# Patient Record
Sex: Female | Born: 1935
Health system: Southern US, Community
[De-identification: ages and names within clinical notes are randomized; demographics above are authoritative.]

## PROBLEM LIST (undated history)

## (undated) DIAGNOSIS — I480 Paroxysmal atrial fibrillation: Secondary | ICD-10-CM

## (undated) DIAGNOSIS — M810 Age-related osteoporosis without current pathological fracture: Secondary | ICD-10-CM

## (undated) DIAGNOSIS — I359 Nonrheumatic aortic valve disorder, unspecified: Secondary | ICD-10-CM

## (undated) DIAGNOSIS — I10 Essential (primary) hypertension: Secondary | ICD-10-CM

## (undated) DIAGNOSIS — E785 Hyperlipidemia, unspecified: Secondary | ICD-10-CM

## (undated) DIAGNOSIS — M1990 Unspecified osteoarthritis, unspecified site: Secondary | ICD-10-CM

## (undated) DIAGNOSIS — Z953 Presence of xenogenic heart valve: Secondary | ICD-10-CM

## (undated) DIAGNOSIS — R499 Unspecified voice and resonance disorder: Secondary | ICD-10-CM

## (undated) DIAGNOSIS — I639 Cerebral infarction, unspecified: Secondary | ICD-10-CM

## (undated) DIAGNOSIS — J449 Chronic obstructive pulmonary disease, unspecified: Secondary | ICD-10-CM

## (undated) DIAGNOSIS — R011 Cardiac murmur, unspecified: Secondary | ICD-10-CM

## (undated) DIAGNOSIS — H9190 Unspecified hearing loss, unspecified ear: Secondary | ICD-10-CM

## (undated) DIAGNOSIS — C801 Malignant (primary) neoplasm, unspecified: Secondary | ICD-10-CM

## (undated) DIAGNOSIS — R0989 Other specified symptoms and signs involving the circulatory and respiratory systems: Secondary | ICD-10-CM

## (undated) DIAGNOSIS — I350 Nonrheumatic aortic (valve) stenosis: Secondary | ICD-10-CM

## (undated) DIAGNOSIS — I739 Peripheral vascular disease, unspecified: Secondary | ICD-10-CM

## (undated) HISTORY — DX: Unspecified hearing loss, unspecified ear: H91.90

## (undated) HISTORY — DX: Cardiac murmur, unspecified: R01.1

## (undated) HISTORY — DX: Unspecified osteoarthritis, unspecified site: M19.90

## (undated) HISTORY — DX: Cerebral infarction, unspecified: I63.9

## (undated) HISTORY — DX: Essential (primary) hypertension: I10

## (undated) HISTORY — DX: Hyperlipidemia, unspecified: E78.5

## (undated) HISTORY — DX: Chronic obstructive pulmonary disease, unspecified: J44.9

## (undated) HISTORY — DX: Other specified symptoms and signs involving the circulatory and respiratory systems: R09.89

## (undated) HISTORY — DX: Age-related osteoporosis without current pathological fracture: M81.0

## (undated) HISTORY — PX: ABDOMINAL HYSTERECTOMY: SHX81

## (undated) HISTORY — DX: Malignant (primary) neoplasm, unspecified: C80.1

## (undated) HISTORY — DX: Unspecified voice and resonance disorder: R49.9

## (undated) HISTORY — DX: Peripheral vascular disease, unspecified: I73.9

## (undated) HISTORY — PX: PARTIAL HYSTERECTOMY: SHX80

## (undated) HISTORY — PX: CARDIAC CATHETERIZATION: SHX172

## (undated) HISTORY — DX: Nonrheumatic aortic valve disorder, unspecified: I35.9

---

## 1957-03-01 HISTORY — PX: COLON SURGERY: SHX602

## 1995-03-02 HISTORY — PX: EYE SURGERY: SHX253

## 1995-03-02 HISTORY — PX: COSMETIC SURGERY: SHX468

## 1995-09-29 DIAGNOSIS — Z853 Personal history of malignant neoplasm of breast: Secondary | ICD-10-CM | POA: Insufficient documentation

## 1995-09-29 HISTORY — PX: MASTECTOMY: SHX3

## 1997-02-25 HISTORY — PX: BREAST LUMPECTOMY: SHX2

## 1998-06-25 ENCOUNTER — Encounter: Payer: Self-pay | Admitting: Internal Medicine

## 1998-06-25 ENCOUNTER — Ambulatory Visit (HOSPITAL_COMMUNITY): Admission: RE | Admit: 1998-06-25 | Discharge: 1998-06-25 | Payer: Self-pay | Admitting: Internal Medicine

## 1998-08-11 ENCOUNTER — Ambulatory Visit (HOSPITAL_BASED_OUTPATIENT_CLINIC_OR_DEPARTMENT_OTHER): Admission: RE | Admit: 1998-08-11 | Discharge: 1998-08-11 | Payer: Self-pay

## 1999-06-26 ENCOUNTER — Ambulatory Visit (HOSPITAL_COMMUNITY): Admission: RE | Admit: 1999-06-26 | Discharge: 1999-06-26 | Payer: Self-pay

## 2000-05-02 ENCOUNTER — Other Ambulatory Visit: Admission: RE | Admit: 2000-05-02 | Discharge: 2000-05-02 | Payer: Self-pay | Admitting: Internal Medicine

## 2000-06-27 ENCOUNTER — Ambulatory Visit (HOSPITAL_COMMUNITY): Admission: RE | Admit: 2000-06-27 | Discharge: 2000-06-27 | Payer: Self-pay

## 2000-07-07 ENCOUNTER — Other Ambulatory Visit: Admission: RE | Admit: 2000-07-07 | Discharge: 2000-07-07 | Payer: Self-pay | Admitting: Internal Medicine

## 2000-07-07 ENCOUNTER — Encounter (INDEPENDENT_AMBULATORY_CARE_PROVIDER_SITE_OTHER): Payer: Self-pay | Admitting: Specialist

## 2001-06-28 ENCOUNTER — Ambulatory Visit (HOSPITAL_COMMUNITY): Admission: RE | Admit: 2001-06-28 | Discharge: 2001-06-28 | Payer: Self-pay | Admitting: Surgery

## 2001-06-28 ENCOUNTER — Encounter: Payer: Self-pay | Admitting: Surgery

## 2002-03-29 ENCOUNTER — Inpatient Hospital Stay (HOSPITAL_COMMUNITY): Admission: EM | Admit: 2002-03-29 | Discharge: 2002-03-30 | Payer: Self-pay | Admitting: Emergency Medicine

## 2002-03-29 ENCOUNTER — Encounter: Payer: Self-pay | Admitting: Family Medicine

## 2002-03-30 ENCOUNTER — Encounter: Payer: Self-pay | Admitting: Family Medicine

## 2002-04-12 ENCOUNTER — Encounter: Admission: RE | Admit: 2002-04-12 | Discharge: 2002-04-12 | Payer: Self-pay | Admitting: Family Medicine

## 2002-07-02 ENCOUNTER — Encounter: Payer: Self-pay | Admitting: Surgery

## 2002-07-02 ENCOUNTER — Ambulatory Visit (HOSPITAL_COMMUNITY): Admission: RE | Admit: 2002-07-02 | Discharge: 2002-07-02 | Payer: Self-pay | Admitting: Surgery

## 2003-07-04 ENCOUNTER — Ambulatory Visit (HOSPITAL_COMMUNITY): Admission: RE | Admit: 2003-07-04 | Discharge: 2003-07-04 | Payer: Self-pay | Admitting: Surgery

## 2004-01-19 ENCOUNTER — Emergency Department (HOSPITAL_COMMUNITY): Admission: EM | Admit: 2004-01-19 | Discharge: 2004-01-19 | Payer: Self-pay | Admitting: Emergency Medicine

## 2004-07-06 ENCOUNTER — Ambulatory Visit (HOSPITAL_COMMUNITY): Admission: RE | Admit: 2004-07-06 | Discharge: 2004-07-06 | Payer: Self-pay | Admitting: Surgery

## 2005-07-12 ENCOUNTER — Ambulatory Visit (HOSPITAL_COMMUNITY): Admission: RE | Admit: 2005-07-12 | Discharge: 2005-07-12 | Payer: Self-pay | Admitting: Surgery

## 2005-08-03 ENCOUNTER — Encounter: Admission: RE | Admit: 2005-08-03 | Discharge: 2005-08-03 | Payer: Self-pay | Admitting: Surgery

## 2005-10-08 HISTORY — PX: BIOPSY SHOULDER: PRO31

## 2006-08-23 ENCOUNTER — Ambulatory Visit (HOSPITAL_COMMUNITY): Admission: RE | Admit: 2006-08-23 | Discharge: 2006-08-23 | Payer: Self-pay | Admitting: Surgery

## 2007-08-24 ENCOUNTER — Ambulatory Visit (HOSPITAL_COMMUNITY): Admission: RE | Admit: 2007-08-24 | Discharge: 2007-08-24 | Payer: Self-pay | Admitting: *Deleted

## 2008-08-06 DIAGNOSIS — R0989 Other specified symptoms and signs involving the circulatory and respiratory systems: Secondary | ICD-10-CM

## 2008-08-06 HISTORY — DX: Other specified symptoms and signs involving the circulatory and respiratory systems: R09.89

## 2008-08-26 ENCOUNTER — Ambulatory Visit (HOSPITAL_COMMUNITY): Admission: RE | Admit: 2008-08-26 | Discharge: 2008-08-26 | Payer: Self-pay | Admitting: Surgery

## 2009-01-20 ENCOUNTER — Ambulatory Visit (HOSPITAL_COMMUNITY): Admission: RE | Admit: 2009-01-20 | Discharge: 2009-01-20 | Payer: Self-pay | Admitting: Internal Medicine

## 2009-04-17 ENCOUNTER — Encounter: Admission: RE | Admit: 2009-04-17 | Discharge: 2009-04-17 | Payer: Self-pay | Admitting: Internal Medicine

## 2009-06-02 ENCOUNTER — Ambulatory Visit (HOSPITAL_COMMUNITY): Admission: RE | Admit: 2009-06-02 | Discharge: 2009-06-02 | Payer: Self-pay | Admitting: Internal Medicine

## 2009-09-24 ENCOUNTER — Ambulatory Visit (HOSPITAL_COMMUNITY): Admission: RE | Admit: 2009-09-24 | Discharge: 2009-09-24 | Payer: Self-pay | Admitting: Surgery

## 2010-03-22 ENCOUNTER — Encounter: Payer: Self-pay | Admitting: Surgery

## 2010-06-19 ENCOUNTER — Ambulatory Visit (HOSPITAL_COMMUNITY): Payer: Medicare Other | Attending: Internal Medicine

## 2010-06-19 DIAGNOSIS — M810 Age-related osteoporosis without current pathological fracture: Secondary | ICD-10-CM | POA: Insufficient documentation

## 2010-07-17 NOTE — Discharge Summary (Signed)
[name], [name]                         ACCOUNT NO.:  [id number]   MEDICAL RECORD NO.:  [id number]                   PATIENT TYPE:  INP   LOCATION:  5731                                 FACILITY:  [facility]   PHYSICIAN:  Barney Drain, M.D.                 DATE OF BIRTH:  [birth year]   DATE OF ADMISSION:  03/29/2002  DATE OF DISCHARGE:  03/30/2002                                 DISCHARGE SUMMARY   SERVICE:  Family Practice Teaching Service.   ATTENDING PHYSICIAN:  Leighton Roach McDiarmid, M.D.   DISCHARGE DIAGNOSES:  1. Gastroesophageal reflux disease.  2. Hiatal hernia.  3. Osteoporosis.  4. Chronic constipation.  5. Colonic polyps.   DISCHARGE MEDICATIONS:  1. Protonix 40 mg one tablet p.o. b.i.d. for two weeks, then should be able     to decrease to one tablet p.o. daily depending on symptom control.  2. Patient is instructed to stop taking her Prilosec.  3. MiraLax, patient to increase her dosage to b.i.d. X3 days and then to     resume one packet p.o. daily.  4. Patient is instructed to stop taking Fosamax.  5. Tums, four tablets p.o. daily for calcium supplementation.  6. Continue Colace and Senokot as prior to hospitalization for bowel     regimen.  7. Continue vitamins E and C as prior to hospitalization for     supplementation.   DISCHARGE SPECIAL INSTRUCTIONS:  The patient is to avoid spicy and acidic  food.  She is to keep the head of her bed elevated and to avoid lying down  after eating.   DISPOSITION:  The patient was discharged home in stable condition with her  husband.   FOLLOW UP:  The patient is to see Saul Fordyce, Nurse Practitioner at [facility] on April 13, 2002 at 9:00 a.m.   PROCEDURES:  Upper gastrointestinal series performed by Dr. ______, [facility] on March 30, 2002.   CONSULTATIONS:   Gastroenterology, Dr. Tresa Endo.   HISTORY AND PHYSICAL:  The patient is a 77 year old female who was referred  to [facility] for evaluation by her primary care physician  at [facility] for pharyngitis, cough X4 days, inability to  keep any food down with postprandial cough.  The patient uses Prilosec  which was not providing any relief.  Her cough was nonproductive. She denied  fever, chills, nausea or vomiting.  No sick contacts.  The patient does have  a known vocal cord dysfunction for which she is treated with Botox  injections by Dr. Mikey Bussing at [facility].   Pertinent findings on physical examination included a temperature of 97.4,  blood pressure 165/79, oxygen saturation 99% on room air.  The patient was  in no apparent distress.  Her nares were patent. Tympanic membranes were  clear. Oropharynx had slight erythema, moist mucosal membranes.  Neck showed  shotty anterior cervical chain lymphadenopathy.  Lungs were clear to  auscultation save a pseudowheeze.  Abdomen was soft and nontender,  nondistended with good bowel sounds.   LABORATORY DATA:  Pertinent laboratory findings include white blood cell  count 7.2, hemoglobin 12.9, platelet count 238,000, MCV 90.5.  Sodium 138,  potassium 3.6, chloride 104, bicarb 28, BUN 7, creatinine 0.9, glucose 110.  Total bilirubin 0.6, alkaline phosphatase 74, AST 20, ALT 14, total protein  6.6, albumin 4.2, calcium 9.3.   HOSPITAL COURSE:  #1 - DISCOMFORT WITH SWALLOWING:  Upon taking the  patient's history it seemed that the patient's current symptoms were not at  all consistent with her known vocal cord dysfunction.  She has never had  feelings of obstruction when swallowing. Previously she was  known to have a  significant gastroesophageal reflux disease.  She denied postprandial emesis  or any chest discomfort.  [facility] was consulted and  recommended a barium swallow and upper gastrointestinal series which was  performed by Dr. ______.  Upper gastrointestinal series showed a large  hiatal hernia  with prominent reflux.  There was esophageal spasm associated  with gastric content reflux.  After discussing the case with  gastroenterology, it was decided that the patient's symptoms are likely  secondary to her pronounced chronic gastroesophageal reflux disease with  associated esophageal spasm.  Protonix 40 mg p.o. b.i.d. X two weeks was  begun in order to take control of the patient's symptoms.  It is hoped after  two weeks her symptoms will be adequately controlled to decrease the  Protonix to 40 mg daily.  The patient was instructed to stop taking her  Fosamax.  Of importance, the patient had a normal swallow on upper  gastrointestinal series and swallow study today.   #2 - CHRONIC CONSTIPATION:  The patient is to continue on her home regimen  of Senokot and Colace.  As she had barium contrast medium today she is to  increase her MiraLax to b.i.d. dosing for three days and then resume her  once daily dosing.   #3 - OSTEOPOROSIS:  As mentioned above, Fosamax was discontinued secondary  to esophageal irritability.  The patient is be on calcium supplementation  with Tums.                                               Barney Drain, M.D.    SS/MEDQ  D:  03/30/2002  T:  03/30/2002  Job:  [id number]   cc:   Willette Brace [facility]

## 2010-07-31 ENCOUNTER — Encounter (INDEPENDENT_AMBULATORY_CARE_PROVIDER_SITE_OTHER): Payer: Self-pay | Admitting: Surgery

## 2010-08-13 DIAGNOSIS — I739 Peripheral vascular disease, unspecified: Secondary | ICD-10-CM

## 2010-08-13 HISTORY — DX: Peripheral vascular disease, unspecified: I73.9

## 2010-09-03 ENCOUNTER — Other Ambulatory Visit (INDEPENDENT_AMBULATORY_CARE_PROVIDER_SITE_OTHER): Payer: Self-pay | Admitting: Surgery

## 2010-09-03 DIAGNOSIS — Z1231 Encounter for screening mammogram for malignant neoplasm of breast: Secondary | ICD-10-CM

## 2010-09-07 ENCOUNTER — Other Ambulatory Visit: Payer: Self-pay | Admitting: Internal Medicine

## 2010-09-07 ENCOUNTER — Other Ambulatory Visit (HOSPITAL_BASED_OUTPATIENT_CLINIC_OR_DEPARTMENT_OTHER): Payer: Self-pay | Admitting: Internal Medicine

## 2010-09-07 ENCOUNTER — Ambulatory Visit (HOSPITAL_COMMUNITY)
Admission: RE | Admit: 2010-09-07 | Discharge: 2010-09-07 | Disposition: A | Discharge status: Home / Self Care | Payer: Medicare Other | Source: Ambulatory Visit | Attending: Internal Medicine | Admitting: Internal Medicine

## 2010-09-07 DIAGNOSIS — R059 Cough, unspecified: Secondary | ICD-10-CM

## 2010-09-07 DIAGNOSIS — J40 Bronchitis, not specified as acute or chronic: Secondary | ICD-10-CM | POA: Insufficient documentation

## 2010-09-07 DIAGNOSIS — R05 Cough: Secondary | ICD-10-CM | POA: Insufficient documentation

## 2010-09-28 ENCOUNTER — Ambulatory Visit (HOSPITAL_COMMUNITY)
Admission: RE | Admit: 2010-09-28 | Discharge: 2010-09-28 | Disposition: A | Discharge status: Home / Self Care | Payer: Medicare Other | Source: Ambulatory Visit | Attending: Surgery | Admitting: Surgery

## 2010-09-28 DIAGNOSIS — Z1231 Encounter for screening mammogram for malignant neoplasm of breast: Secondary | ICD-10-CM

## 2010-09-30 HISTORY — PX: TOTAL HIP ARTHROPLASTY: SHX124

## 2010-10-09 ENCOUNTER — Other Ambulatory Visit: Payer: Self-pay | Admitting: Orthopedic Surgery

## 2010-10-09 ENCOUNTER — Encounter (HOSPITAL_COMMUNITY): Payer: Medicare Other

## 2010-10-09 LAB — SURGICAL PCR SCREEN
MRSA, PCR: NEGATIVE
Staphylococcus aureus: NEGATIVE

## 2010-10-09 LAB — COMPREHENSIVE METABOLIC PANEL
ALT: 18 U/L (ref 0–35)
AST: 23 U/L (ref 0–37)
Albumin: 4.3 g/dL (ref 3.5–5.2)
Alkaline Phosphatase: 59 U/L (ref 39–117)
BUN: 9 mg/dL (ref 6–23)
CO2: 30 mEq/L (ref 19–32)
Calcium: 10.4 mg/dL (ref 8.4–10.5)
Chloride: 99 mEq/L (ref 96–112)
Creatinine, Ser: 0.84 mg/dL (ref 0.50–1.10)
GFR calc Af Amer: >60 mL/min (ref >60)
GFR calc non Af Amer: >60 mL/min (ref >60)
Glucose, Bld: 98 mg/dL (ref 70–99)
Potassium: 4.4 mEq/L (ref 3.5–5.1)
Sodium: 136 mEq/L (ref 135–145)
Total Bilirubin: 0.4 mg/dL (ref 0.3–1.2)
Total Protein: 7.3 g/dL (ref 6.0–8.3)

## 2010-10-09 LAB — DIFFERENTIAL
Basophils Absolute: 0.1 10*3/uL (ref 0.0–0.1)
Basophils Relative: 1 % (ref 0–1)
Eosinophils Absolute: 0.2 10*3/uL (ref 0.0–0.7)
Eosinophils Relative: 3 % (ref 0–5)
Lymphocytes Relative: 24 % (ref 12–46)
Lymphs Abs: 1.8 10*3/uL (ref 0.7–4.0)
Monocytes Absolute: 0.6 10*3/uL (ref 0.1–1.0)
Monocytes Relative: 8 % (ref 3–12)
Neutro Abs: 4.6 10*3/uL (ref 1.7–7.7)
Neutrophils Relative %: 64 % (ref 43–77)

## 2010-10-09 LAB — PROTIME-INR
INR: 0.98 (ref 0.00–1.49)
Prothrombin Time: 13.2 seconds (ref 11.6–15.2)

## 2010-10-09 LAB — URINALYSIS, ROUTINE W REFLEX MICROSCOPIC
Bilirubin Urine: NEGATIVE
Glucose, UA: NEGATIVE mg/dL
Hgb urine dipstick: NEGATIVE
Ketones, ur: NEGATIVE mg/dL
Leukocytes, UA: NEGATIVE
Nitrite: NEGATIVE
Protein, ur: NEGATIVE mg/dL
Specific Gravity, Urine: 1.01 (ref 1.005–1.030)
Urobilinogen, UA: 0.2 mg/dL (ref 0.0–1.0)
pH: 7 (ref 5.0–8.0)

## 2010-10-09 LAB — APTT: aPTT: 37 seconds (ref 24–37)

## 2010-10-09 LAB — CBC
HCT: 36.2 % (ref 36.0–46.0)
Hemoglobin: 12.3 g/dL (ref 12.0–15.0)
MCH: 31.1 pg (ref 26.0–34.0)
MCHC: 34 g/dL (ref 30.0–36.0)
MCV: 91.6 fL (ref 78.0–100.0)
Platelets: 234 10*3/uL (ref 150–400)
RBC: 3.95 MIL/uL (ref 3.87–5.11)
RDW: 12.8 % (ref 11.5–15.5)
WBC: 7.2 10*3/uL (ref 4.0–10.5)

## 2010-10-15 ENCOUNTER — Inpatient Hospital Stay (HOSPITAL_COMMUNITY)
Admission: RE | Admit: 2010-10-15 | Discharge: 2010-10-19 | DRG: 467 | Disposition: A | Discharge status: Home Health | Payer: Medicare Other | Source: Ambulatory Visit | Attending: Orthopedic Surgery | Admitting: Orthopedic Surgery

## 2010-10-15 ENCOUNTER — Inpatient Hospital Stay (HOSPITAL_COMMUNITY): Payer: Medicare Other

## 2010-10-15 DIAGNOSIS — K219 Gastro-esophageal reflux disease without esophagitis: Secondary | ICD-10-CM | POA: Diagnosis present

## 2010-10-15 DIAGNOSIS — L508 Other urticaria: Secondary | ICD-10-CM | POA: Diagnosis not present

## 2010-10-15 DIAGNOSIS — Z9049 Acquired absence of other specified parts of digestive tract: Secondary | ICD-10-CM

## 2010-10-15 DIAGNOSIS — I359 Nonrheumatic aortic valve disorder, unspecified: Secondary | ICD-10-CM | POA: Diagnosis present

## 2010-10-15 DIAGNOSIS — D62 Acute posthemorrhagic anemia: Secondary | ICD-10-CM | POA: Diagnosis not present

## 2010-10-15 DIAGNOSIS — I10 Essential (primary) hypertension: Secondary | ICD-10-CM | POA: Diagnosis present

## 2010-10-15 DIAGNOSIS — I959 Hypotension, unspecified: Secondary | ICD-10-CM | POA: Diagnosis not present

## 2010-10-15 DIAGNOSIS — E871 Hypo-osmolality and hyponatremia: Secondary | ICD-10-CM | POA: Diagnosis not present

## 2010-10-15 DIAGNOSIS — Z01812 Encounter for preprocedural laboratory examination: Secondary | ICD-10-CM

## 2010-10-15 DIAGNOSIS — E876 Hypokalemia: Secondary | ICD-10-CM | POA: Diagnosis not present

## 2010-10-15 DIAGNOSIS — Z96649 Presence of unspecified artificial hip joint: Secondary | ICD-10-CM

## 2010-10-15 DIAGNOSIS — R0989 Other specified symptoms and signs involving the circulatory and respiratory systems: Secondary | ICD-10-CM | POA: Diagnosis present

## 2010-10-15 DIAGNOSIS — T84099A Other mechanical complication of unspecified internal joint prosthesis, initial encounter: Principal | ICD-10-CM | POA: Diagnosis present

## 2010-10-15 DIAGNOSIS — T84069A Wear of articular bearing surface of unspecified internal prosthetic joint, initial encounter: Secondary | ICD-10-CM | POA: Diagnosis present

## 2010-10-15 DIAGNOSIS — Y831 Surgical operation with implant of artificial internal device as the cause of abnormal reaction of the patient, or of later complication, without mention of misadventure at the time of the procedure: Secondary | ICD-10-CM | POA: Diagnosis present

## 2010-10-15 DIAGNOSIS — F411 Generalized anxiety disorder: Secondary | ICD-10-CM | POA: Diagnosis present

## 2010-10-15 DIAGNOSIS — Z853 Personal history of malignant neoplasm of breast: Secondary | ICD-10-CM

## 2010-10-15 DIAGNOSIS — H269 Unspecified cataract: Secondary | ICD-10-CM | POA: Diagnosis present

## 2010-10-15 DIAGNOSIS — K5900 Constipation, unspecified: Secondary | ICD-10-CM | POA: Diagnosis present

## 2010-10-15 LAB — ABO/RH: ABO/RH(D): O NEG

## 2010-10-16 LAB — CBC
HCT: 27.8 % — ABNORMAL LOW (ref 36.0–46.0)
Hemoglobin: 9.5 g/dL — ABNORMAL LOW (ref 12.0–15.0)
MCH: 31.6 pg (ref 26.0–34.0)
MCHC: 34.2 g/dL (ref 30.0–36.0)
MCV: 92.4 fL (ref 78.0–100.0)
Platelets: 178 10*3/uL (ref 150–400)
RBC: 3.01 MIL/uL — ABNORMAL LOW (ref 3.87–5.11)
RDW: 13 % (ref 11.5–15.5)
WBC: 5 10*3/uL (ref 4.0–10.5)

## 2010-10-16 LAB — BASIC METABOLIC PANEL
BUN: 5 mg/dL — ABNORMAL LOW (ref 6–23)
CO2: 29 mEq/L (ref 19–32)
Calcium: 8.1 mg/dL — ABNORMAL LOW (ref 8.4–10.5)
Chloride: 100 mEq/L (ref 96–112)
Creatinine, Ser: 0.72 mg/dL (ref 0.50–1.10)
GFR calc Af Amer: >60 mL/min (ref >60)
GFR calc non Af Amer: >60 mL/min (ref >60)
Glucose, Bld: 107 mg/dL — ABNORMAL HIGH (ref 70–99)
Potassium: 3.3 mEq/L — ABNORMAL LOW (ref 3.5–5.1)
Sodium: 135 mEq/L (ref 135–145)

## 2010-10-17 LAB — CBC
HCT: 29.4 % — ABNORMAL LOW (ref 36.0–46.0)
Hemoglobin: 10.2 g/dL — ABNORMAL LOW (ref 12.0–15.0)
MCH: 31.8 pg (ref 26.0–34.0)
MCHC: 34.7 g/dL (ref 30.0–36.0)
MCV: 91.6 fL (ref 78.0–100.0)
Platelets: 189 10*3/uL (ref 150–400)
RBC: 3.21 MIL/uL — ABNORMAL LOW (ref 3.87–5.11)
RDW: 13.2 % (ref 11.5–15.5)
WBC: 7.3 10*3/uL (ref 4.0–10.5)

## 2010-10-17 LAB — BASIC METABOLIC PANEL
BUN: 11 mg/dL (ref 6–23)
CO2: 28 mEq/L (ref 19–32)
Calcium: 8.9 mg/dL (ref 8.4–10.5)
Chloride: 105 mEq/L (ref 96–112)
Creatinine, Ser: 0.87 mg/dL (ref 0.50–1.10)
GFR calc Af Amer: >60 mL/min (ref >60)
GFR calc non Af Amer: >60 mL/min (ref >60)
Glucose, Bld: 142 mg/dL — ABNORMAL HIGH (ref 70–99)
Potassium: 4.1 mEq/L (ref 3.5–5.1)
Sodium: 138 mEq/L (ref 135–145)

## 2010-10-18 LAB — WOUND CULTURE
Culture: NO GROWTH
Gram Stain: NONE SEEN

## 2010-10-18 LAB — CBC
HCT: 25.5 % — ABNORMAL LOW (ref 36.0–46.0)
Hemoglobin: 8.7 g/dL — ABNORMAL LOW (ref 12.0–15.0)
MCH: 31.9 pg (ref 26.0–34.0)
MCHC: 34.1 g/dL (ref 30.0–36.0)
MCV: 93.4 fL (ref 78.0–100.0)
Platelets: 182 10*3/uL (ref 150–400)
RBC: 2.73 MIL/uL — ABNORMAL LOW (ref 3.87–5.11)
RDW: 13.3 % (ref 11.5–15.5)
WBC: 6.9 10*3/uL (ref 4.0–10.5)

## 2010-10-18 LAB — PREPARE RBC (CROSSMATCH)

## 2010-10-19 LAB — CROSSMATCH
ABO/RH(D): O NEG
Antibody Screen: NEGATIVE
Unit division: 0
Unit division: 0

## 2010-10-20 LAB — ANAEROBIC CULTURE: Gram Stain: NONE SEEN

## 2010-10-30 NOTE — Discharge Summary (Signed)
[name]               ACCOUNT NO.:  [id number]  MEDICAL RECORD NO.:  [id number]  LOCATION:  1614                         FACILITY:  [facility]  PHYSICIAN:  Adylynn Hertenstein L. Rendall, M.D.  DATE OF BIRTH:  1931/06/06  DATE OF ADMISSION:  10/15/2010 DATE OF DISCHARGE:  10/19/2010                              DISCHARGE SUMMARY   ADMISSION DIAGNOSES: 1. Failed right acetabular component. 2. Chronic constipation. 3. Aortic stenosis. 4. Right carotid bruit. 5. Anxiety. 6. Gastroesophageal reflux disease. 7. Chronic larynx and tracheal scarring together. 8. Cataracts. 9. History of breast cancer.  DISCHARGE DIAGNOSES: 1. Failed right acetabular component, now status post revision with     poly exchange in the femoral head. 2. Acute blood loss anemia secondary to surgery. 3. Hypokalemia, now resolved. 4. Hyponatremia, now resolved. 5. Stress urticaria, now resolved. 6. Chronic constipation. 7. Aortic stenosis. 8. Right carotid bruit. 9. Anxiety. 10.Gastroesophageal reflux disease. 11.Chronic larynx and tracheal scarring. 12.Cataracts. 13.History of breast cancer.  SURGICAL PROCEDURES:  On October 15, 2010, [name] underwent a poly exchange of her acetabular liner with a new hip ball and debridement of foreign debris by Dr. Jonny Ruiz L.  Rendall, assisted by Arnoldo Morale PA-C. She had a new apex hole eliminator placed with a Duraloc dynamic locking ring, a Duraloc marathon acetabular liner, 10-degree lipped, 32 mm inner diameter, 54 mm outer diameter, and a new total hip ball femoral head 32 mm +18 with 14/16 taper.  COMPLICATIONS:  None.  CONSULTS: 1. Physical therapy consult, October 16, 2010. 2. Case management consult, October 15, 2010.  HISTORY OF PRESENT ILLNESS:  This 77 year old white female patient presented to Dr. Priscille Kluver with history of a right total hip in 1994 and a revision in 1996 by Dr. Chaney Malling.  She is now having 1- to 2-year history of intermittent right  hip pain without injury.  Pain is an intermittent pinch to throb in the right groin without radiation. Nothing really aggravates it and gets better with rest.  X-rays show polyethylene wear in the acetabulum and possible cyst formation. Because of this, she is presenting for right hip revision.  HOSPITAL COURSE:  [name] tolerated her surgical procedure well without immediate postop complications.  She was transferred to the orthopedic floor.  Postop day #1, she was afebrile, vitals were stable, although blood pressure was a bit low of 94/57.  Hemoglobin was 9.5, hematocrit 27.8.  Her leg was neurovascularly intact.  She did have some low potassium at 3.3 that was supplemented.  She was weaned off her oxygen and potassium supplements started on therapy per protocol.  On postop day #2, she had a surprise pruritic reaction across her back. She states she has this happen at home in the past and has been worked up before.  It was treated with Benadryl and Atarax.  Her Foley was discontinued.  She was able to void and she was continued on therapy.  On postop day #3, she remained with a low BP 99/63, hemoglobin was 8.7. She was a bit symptomatic and because of that she was transfused with 2 units of packed red blood cells.  She was continued on therapy.  On postop day #  4, she is feeling better.  She is walking well with a walker.  Her rash seems to have resolved.  BP still was low at 98/56, but she is doing well with therapy and has felt she is ready for discharge to home.  She will be discharged to home later today.  DISCHARGE INSTRUCTIONS:  DIET:  She can resume her regular prehospitalization diet.  MEDICATIONS:  Please see the patient's home med discharge instruction sheet for complete documentation of her medicines.  We, however, did start her on Celebrex, Robaxin, Percocet, Xarelto, and tramadol.  Again you can see complete documentation of that on the patient med  discharge instruction sheet.  ACTIVITY:  She can be out of bed weightbearing as tolerated on the right leg with use of walker.  She is to do no lift or no driving for 6 weeks. Please see the white total joint discharge sheet for further activity instructions.  WOUND CARE:  Please see the white total joint discharge sheet for further wound care instructions.  FOLLOWUP:  She is to follow up with Dr. Priscille Kluver in our office on Tuesday, October 27, 2010, and she needs to call [phone] for that appointment.  She is arranged for home health PT per [facility].  LABORATORY DATA:  Hemoglobin and hematocrit have ranged from 9.5 and 27.8 on the October 16, 2010, to 8.7 and 25.5 on October 18, 2010.  White count and platelets were within normal limits.  Potassium was low at 3.3 on the October 16, 2010, it went to 4.1 on October 17, 2010.  Glucose ranged from 107 to 142 on October 16, 2010 and October 17, 2010.  BUN was 5 with creatinine of 0.72 on October 16, 2010, and then was within normal limits.  Calcium was 8.1 on October 16, 2010 and then went to 8.9 on October 17, 2010.  All other laboratory studies were within normal limits.  X-ray taken of the right hip on October 15, 2010, showed right total hip arthroplasty with bone around the distal stem not visualized, but no fractures were noted.  Chest x-ray done on September 07, 2010, showed bronchitis, but no consolidation or collapse.     Legrand Pitts Duffy, P.A.   ______________________________ Carlisle Beers. Priscille Kluver, M.D.    KED/MEDQ  D:  10/19/2010  T:  10/19/2010  Job:  [id number]  Electronically Signed by Otilio Jefferson. on 10/21/2010 11:19:19 AM Electronically Signed by Erasmo Leventhal M.D. on 10/30/2010 07:26:13 AM

## 2010-10-30 NOTE — Op Note (Signed)
NAMENEGAR, SIELER               ACCOUNT NO.:  [id number]  MEDICAL RECORD NO.:  [id number]  LOCATION:  1614                         FACILITY:  [facility]  PHYSICIAN:  Ami Thornsberry L. Rendall, M.D.  DATE OF BIRTH:  1931-08-18  DATE OF PROCEDURE:  10/15/2010 DATE OF DISCHARGE:                              OPERATIVE REPORT   PREOPERATIVE DIAGNOSIS:  Failed acetabular component right total hip.  SURGICAL PROCEDURE:  Exchange of acetabular liner and hip ball with debridement of foreign body debris - extensive.  POSTOPERATIVE DIAGNOSIS:  Failed acetabular component right total hip.  SURGEON:  Lemma Tetro L. Rendall, M.D.  ASSISTANT:  Legrand Pitts. Duffy, P.A. present and participating in entire procedure.  PATHOLOGY:  The patient has a 77 year old total hip replacement that has become symptomatic with progressively increasing aching in the last 2 years.  Her x-rays show progressive acetabular wear with migration superiorly of a 28 hip ball that is now within a couple millimeters of metal-on-metal.  Rather than wait for a that to happen with significant aching already present, we have decided to exchange the acetabular liner and hip ball and debride the foreign body reaction.  X-rays also demonstrate no significant bony lysis from the foreign body debris.  DESCRIPTION OF PROCEDURE:  Under general anesthesia, the patient was placed in the left lateral decubitus position and the left hip was draped free with DuraPrep.  The previous posterior incision is largely reopened, but it is somewhat posterior and dissection at the level of the fascia anteriorly 1 inch was done to split the fascia directly over the greater trochanter.  With this done, the IT band was split in line of its fibers.  A deep Charnley retractor was inserted.  The hip capsule and short external rotators were then taken down from the bone as the hip was internally rotated.  It is possible to take this down to the bone right into the hip  itself.  At this point, Hohmann retractors were placed inferiorly and superiorly and extensive foreign body reaction like a thick white cheese was found.  It was almost an inch thick and it totally surrounds the acetabulum.  It was extensively debrided with rongeurs and cautery and Cobb elevator to free it up.  It is necessary to disimpact the hip ball.  It is an old time AML with a large trunnion 18 mm neck length with a large sleeve and 28 mm hip ball.  With the hip retracted anteriorly, the acetabulum was finally exposed.  The hip capsule was opened in lazy L manner avoiding dissection in the region of the sciatic nerve.  With this thus exposed, the capsular foreign body material was all removed.  The acetabular poly was removed along with its locking ring.  Once the foreign body debris is being removed, the apex hole eliminator was taken out of the acetabulum.  There was no significant foreign body reaction behind the cup seen and a new apex hole eliminator was inserted.  The new locking ring was inserted and a 54 acetabular liner for a 36 hip ball was inserted and impacted in place, placing a 36 hip ball on the AML trunnion, was then done but unfortunately  a long enough neck length was not available to stop extensive longitudinal instability.  The hip had extensive shuck and to get the neck length we needed, it was required to use a 32 hip ball and this required changing the acetabular liner to a 54 outside diameter and 32 inside, the locking ring and poly were exchanged again, and a 32 hip ball with a +18 collared sleeve were used.  This was stable through full range of motion.  The hip capsule was closed with #1 Tycron, IT band with #1 Tycron, 2 layers of #1 Vicryl, and then 2-0 Vicryl and skin clips.  Operative time approximately an hour and 20 minutes.  Blood loss approximately 400 cc.  The patient tolerated the procedure well and returned to recovery in good  condition.     Golda Zavalza L. Priscille Kluver, M.D.     Renato Gails  D:  10/15/2010  T:  10/15/2010  Job:  [id number]  Electronically Signed by Erasmo Leventhal M.D. on 10/30/2010 07:26:15 AM

## 2010-12-16 ENCOUNTER — Ambulatory Visit (INDEPENDENT_AMBULATORY_CARE_PROVIDER_SITE_OTHER): Payer: Medicare Other | Admitting: Surgery

## 2010-12-16 ENCOUNTER — Encounter (INDEPENDENT_AMBULATORY_CARE_PROVIDER_SITE_OTHER): Payer: Self-pay | Admitting: General Surgery

## 2010-12-16 VITALS — BP 134/68 | HR 64 | Temp 98.0°F | Resp 16 | Ht 63.0 in | Wt 159.4 lb

## 2010-12-16 DIAGNOSIS — Z853 Personal history of malignant neoplasm of breast: Secondary | ICD-10-CM

## 2010-12-16 NOTE — Progress Notes (Signed)
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 She has no problems or concerns on either side.  PFSH: She has had no significant changes since the last visit here.  ROS: There have been no significant changes since the last visit here  EXAM: General: The patient is alert, oriented, generally healty appearing, NAD. Mood and affect are normal.  Breasts:  The right breast is normal. The left breast is surgically absent without evidence of recurrence  Lymphatics: She has no axillary or supraclavicular adenopathy on either side.  Extremities: Full ROM of the surgical side with no lymphedema noted.  Data Reviewed: Mammogram this summer was negative  Impression: Doing well, with no evidence of recurrent cancer or new cancer  Plan: Will continue to follow up on an annual basis here.

## 2010-12-16 NOTE — Patient Instructions (Signed)
See me again next year. Call if you have any problems

## 2010-12-25 DIAGNOSIS — J385 Laryngeal spasm: Secondary | ICD-10-CM | POA: Insufficient documentation

## 2011-03-15 DIAGNOSIS — N3944 Nocturnal enuresis: Secondary | ICD-10-CM | POA: Diagnosis not present

## 2011-03-15 DIAGNOSIS — N3946 Mixed incontinence: Secondary | ICD-10-CM | POA: Diagnosis not present

## 2011-04-22 DIAGNOSIS — N3946 Mixed incontinence: Secondary | ICD-10-CM | POA: Diagnosis not present

## 2011-04-22 DIAGNOSIS — N3944 Nocturnal enuresis: Secondary | ICD-10-CM | POA: Diagnosis not present

## 2011-04-28 DIAGNOSIS — N3944 Nocturnal enuresis: Secondary | ICD-10-CM | POA: Diagnosis not present

## 2011-04-28 DIAGNOSIS — N3946 Mixed incontinence: Secondary | ICD-10-CM | POA: Diagnosis not present

## 2011-05-21 DIAGNOSIS — J385 Laryngeal spasm: Secondary | ICD-10-CM | POA: Diagnosis not present

## 2011-06-10 DIAGNOSIS — N3944 Nocturnal enuresis: Secondary | ICD-10-CM | POA: Diagnosis not present

## 2011-06-10 DIAGNOSIS — N3946 Mixed incontinence: Secondary | ICD-10-CM | POA: Diagnosis not present

## 2011-06-14 ENCOUNTER — Other Ambulatory Visit (HOSPITAL_BASED_OUTPATIENT_CLINIC_OR_DEPARTMENT_OTHER): Payer: Self-pay | Admitting: Internal Medicine

## 2011-06-14 DIAGNOSIS — Z780 Asymptomatic menopausal state: Secondary | ICD-10-CM

## 2011-06-14 DIAGNOSIS — I359 Nonrheumatic aortic valve disorder, unspecified: Secondary | ICD-10-CM | POA: Diagnosis not present

## 2011-06-14 DIAGNOSIS — N3946 Mixed incontinence: Secondary | ICD-10-CM | POA: Diagnosis not present

## 2011-06-14 DIAGNOSIS — M810 Age-related osteoporosis without current pathological fracture: Secondary | ICD-10-CM | POA: Diagnosis not present

## 2011-06-16 DIAGNOSIS — M6281 Muscle weakness (generalized): Secondary | ICD-10-CM | POA: Diagnosis not present

## 2011-06-16 DIAGNOSIS — R279 Unspecified lack of coordination: Secondary | ICD-10-CM | POA: Diagnosis not present

## 2011-06-16 DIAGNOSIS — N3946 Mixed incontinence: Secondary | ICD-10-CM | POA: Diagnosis not present

## 2011-06-16 DIAGNOSIS — N3944 Nocturnal enuresis: Secondary | ICD-10-CM | POA: Diagnosis not present

## 2011-06-18 DIAGNOSIS — I061 Rheumatic aortic insufficiency: Secondary | ICD-10-CM | POA: Diagnosis not present

## 2011-06-18 DIAGNOSIS — I10 Essential (primary) hypertension: Secondary | ICD-10-CM | POA: Diagnosis not present

## 2011-06-22 ENCOUNTER — Ambulatory Visit (HOSPITAL_COMMUNITY)
Admission: RE | Admit: 2011-06-22 | Discharge: 2011-06-22 | Disposition: A | Discharge status: Home / Self Care | Payer: Medicare Other | Source: Ambulatory Visit | Attending: Internal Medicine | Admitting: Internal Medicine

## 2011-06-22 DIAGNOSIS — Z13820 Encounter for screening for osteoporosis: Secondary | ICD-10-CM | POA: Insufficient documentation

## 2011-06-22 DIAGNOSIS — M949 Disorder of cartilage, unspecified: Secondary | ICD-10-CM | POA: Diagnosis not present

## 2011-06-22 DIAGNOSIS — Z780 Asymptomatic menopausal state: Secondary | ICD-10-CM | POA: Insufficient documentation

## 2011-06-22 DIAGNOSIS — M810 Age-related osteoporosis without current pathological fracture: Secondary | ICD-10-CM | POA: Diagnosis not present

## 2011-06-22 DIAGNOSIS — M899 Disorder of bone, unspecified: Secondary | ICD-10-CM | POA: Diagnosis not present

## 2011-06-23 DIAGNOSIS — N3946 Mixed incontinence: Secondary | ICD-10-CM | POA: Diagnosis not present

## 2011-06-23 DIAGNOSIS — N3944 Nocturnal enuresis: Secondary | ICD-10-CM | POA: Diagnosis not present

## 2011-06-23 DIAGNOSIS — R279 Unspecified lack of coordination: Secondary | ICD-10-CM | POA: Diagnosis not present

## 2011-06-23 DIAGNOSIS — M6281 Muscle weakness (generalized): Secondary | ICD-10-CM | POA: Diagnosis not present

## 2011-06-28 DIAGNOSIS — E782 Mixed hyperlipidemia: Secondary | ICD-10-CM | POA: Diagnosis not present

## 2011-06-28 DIAGNOSIS — Z79899 Other long term (current) drug therapy: Secondary | ICD-10-CM | POA: Diagnosis not present

## 2011-07-06 DIAGNOSIS — R279 Unspecified lack of coordination: Secondary | ICD-10-CM | POA: Diagnosis not present

## 2011-07-06 DIAGNOSIS — M6281 Muscle weakness (generalized): Secondary | ICD-10-CM | POA: Diagnosis not present

## 2011-07-06 DIAGNOSIS — N3944 Nocturnal enuresis: Secondary | ICD-10-CM | POA: Diagnosis not present

## 2011-07-06 DIAGNOSIS — N3946 Mixed incontinence: Secondary | ICD-10-CM | POA: Diagnosis not present

## 2011-07-07 ENCOUNTER — Other Ambulatory Visit (HOSPITAL_COMMUNITY): Payer: Self-pay | Admitting: *Deleted

## 2011-07-12 ENCOUNTER — Encounter (HOSPITAL_COMMUNITY)
Admission: RE | Admit: 2011-07-12 | Discharge: 2011-07-12 | Disposition: A | Discharge status: Home / Self Care | Payer: Medicare Other | Source: Ambulatory Visit | Attending: Internal Medicine | Admitting: Internal Medicine

## 2011-07-12 DIAGNOSIS — M810 Age-related osteoporosis without current pathological fracture: Secondary | ICD-10-CM | POA: Insufficient documentation

## 2011-07-12 MED ORDER — ZOLEDRONIC ACID 5 MG/100ML IV SOLN
5.0000 mg | Freq: Once | INTRAVENOUS | Status: AC
  Administered 2011-07-12: 5 mg via INTRAVENOUS
  Filled 2011-07-12: qty 100

## 2011-08-13 DIAGNOSIS — I359 Nonrheumatic aortic valve disorder, unspecified: Secondary | ICD-10-CM

## 2011-08-13 HISTORY — DX: Nonrheumatic aortic valve disorder, unspecified: I35.9

## 2011-08-26 DIAGNOSIS — N3946 Mixed incontinence: Secondary | ICD-10-CM | POA: Diagnosis not present

## 2011-08-26 DIAGNOSIS — N3944 Nocturnal enuresis: Secondary | ICD-10-CM | POA: Diagnosis not present

## 2011-08-30 ENCOUNTER — Other Ambulatory Visit (INDEPENDENT_AMBULATORY_CARE_PROVIDER_SITE_OTHER): Payer: Self-pay | Admitting: Surgery

## 2011-08-30 DIAGNOSIS — Z1231 Encounter for screening mammogram for malignant neoplasm of breast: Secondary | ICD-10-CM

## 2011-10-04 ENCOUNTER — Ambulatory Visit (HOSPITAL_COMMUNITY)
Admission: RE | Admit: 2011-10-04 | Discharge: 2011-10-04 | Disposition: A | Discharge status: Home / Self Care | Payer: Medicare Other | Source: Ambulatory Visit | Attending: Surgery | Admitting: Surgery

## 2011-10-04 DIAGNOSIS — Z1231 Encounter for screening mammogram for malignant neoplasm of breast: Secondary | ICD-10-CM | POA: Diagnosis not present

## 2011-10-22 DIAGNOSIS — J385 Laryngeal spasm: Secondary | ICD-10-CM | POA: Diagnosis not present

## 2011-11-08 DIAGNOSIS — Z23 Encounter for immunization: Secondary | ICD-10-CM | POA: Diagnosis not present

## 2011-12-13 DIAGNOSIS — Z23 Encounter for immunization: Secondary | ICD-10-CM | POA: Diagnosis not present

## 2011-12-13 DIAGNOSIS — I359 Nonrheumatic aortic valve disorder, unspecified: Secondary | ICD-10-CM | POA: Diagnosis not present

## 2011-12-27 DIAGNOSIS — N3946 Mixed incontinence: Secondary | ICD-10-CM | POA: Diagnosis not present

## 2011-12-27 DIAGNOSIS — R351 Nocturia: Secondary | ICD-10-CM | POA: Diagnosis not present

## 2012-01-12 ENCOUNTER — Encounter (INDEPENDENT_AMBULATORY_CARE_PROVIDER_SITE_OTHER): Payer: Self-pay | Admitting: Surgery

## 2012-01-12 ENCOUNTER — Ambulatory Visit (INDEPENDENT_AMBULATORY_CARE_PROVIDER_SITE_OTHER): Payer: Medicare Other | Admitting: Surgery

## 2012-01-12 VITALS — BP 160/86 | HR 74 | Temp 97.3°F | Resp 18 | Ht 63.0 in | Wt 159.2 lb

## 2012-01-12 DIAGNOSIS — Z853 Personal history of malignant neoplasm of breast: Secondary | ICD-10-CM | POA: Diagnosis not present

## 2012-01-12 NOTE — Progress Notes (Signed)
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 She has no problems or concerns on either side.  PFSH: She has had no significant changes since the last visit here.  ROS: There have been no significant changes since the last visit here  EXAM: General: The patient is alert, oriented, generally healthy appearing, NAD. Mood and affect are normal.  Breasts:  The right breast is normal. The left breast is surgically absent without evidence of recurrence Implant in situ  Lymphatics: She has no axillary or supraclavicular adenopathy on either side.  Extremities: Full ROM of the surgical side with no lymphedema noted.  Data Reviewed: Mammogram tin July: IMPRESSION:  No evidence of malignancy. Screening mammography is recommended in  one year.  BI-RADS CATEGORY 1: Negative.  Original Report Authenticated By: Darrol Angel, M.D   Impression: Doing well, with no evidence of recurrent cancer or new cancer  Plan: RTC PRN

## 2012-01-12 NOTE — Patient Instructions (Signed)
Continue annual mammograms We will see you again on an as needed basis. Please call the office at [phone] if you have any questions or concerns. Thank you for allowing us to take care of you.  

## 2012-02-16 DIAGNOSIS — H26499 Other secondary cataract, unspecified eye: Secondary | ICD-10-CM | POA: Diagnosis not present

## 2012-02-16 DIAGNOSIS — H43819 Vitreous degeneration, unspecified eye: Secondary | ICD-10-CM | POA: Diagnosis not present

## 2012-03-17 DIAGNOSIS — J385 Laryngeal spasm: Secondary | ICD-10-CM | POA: Diagnosis not present

## 2012-06-02 ENCOUNTER — Encounter: Payer: Self-pay | Admitting: Pharmacist Clinician (PhC)/ Clinical Pharmacy Specialist

## 2012-06-06 ENCOUNTER — Encounter: Payer: Self-pay | Admitting: Internal Medicine

## 2012-06-06 ENCOUNTER — Ambulatory Visit (INDEPENDENT_AMBULATORY_CARE_PROVIDER_SITE_OTHER): Payer: Medicare Other | Admitting: Internal Medicine

## 2012-06-06 VITALS — BP 144/82 | HR 67 | Temp 97.9°F | Resp 16 | Ht 62.5 in | Wt 162.8 lb

## 2012-06-06 DIAGNOSIS — K5900 Constipation, unspecified: Secondary | ICD-10-CM | POA: Insufficient documentation

## 2012-06-06 DIAGNOSIS — M810 Age-related osteoporosis without current pathological fracture: Secondary | ICD-10-CM

## 2012-06-06 DIAGNOSIS — I10 Essential (primary) hypertension: Secondary | ICD-10-CM | POA: Diagnosis not present

## 2012-06-06 DIAGNOSIS — G4700 Insomnia, unspecified: Secondary | ICD-10-CM

## 2012-06-06 DIAGNOSIS — K219 Gastro-esophageal reflux disease without esophagitis: Secondary | ICD-10-CM | POA: Insufficient documentation

## 2012-06-06 MED ORDER — ZOLEDRONIC ACID 5 MG/100ML IV SOLN: 5.0000 mg | Freq: Once | INTRAVENOUS | Status: DC

## 2012-06-06 NOTE — Assessment & Plan Note (Signed)
Continue omeprazole 

## 2012-06-06 NOTE — Assessment & Plan Note (Signed)
Improved. Will continue sennosides abd miralax for now

## 2012-06-06 NOTE — Assessment & Plan Note (Addendum)
Taking ca-vit d with yearly reclast. Will check her renal function as last renal function check was more than a year ago. Upon review of labs, will get appointment scheduled for reclast injection. Pt understands and agrees.

## 2012-06-06 NOTE — Assessment & Plan Note (Signed)
Continue hctz for now.

## 2012-06-06 NOTE — Progress Notes (Signed)
  Subjective:    Patient ID: [name], female    DOB: 27-Nov-1931, 79 y.o.   MRN: [id number]  Chief Complaint  Patient presents with  . Medical Managment of Chronic Issues  . Osteoporosis  . Hypertension  . Constipation   HPI  Pt here with her husband, seen by dr Leanord Hawking before. First visit with me. She is due for her yearly reclast and needs it scheduled. Her bowel movement has improved. Her blood pressure is under good control. She also sees cardiology for her AS.  Review of Systems  Constitutional: Negative for fever, chills, appetite change and fatigue.  HENT: Negative for hearing loss and congestion.   Eyes: Negative.   Respiratory: Negative for cough and shortness of breath.   Cardiovascular: Negative for chest pain, palpitations and leg swelling.  Gastrointestinal: Negative for abdominal pain and constipation.  Endocrine: Negative for cold intolerance.  Genitourinary: Negative for dysuria.  Musculoskeletal: Negative for back pain.  Neurological: Negative for dizziness, tremors and light-headedness.  Hematological: Negative for adenopathy.  Psychiatric/Behavioral: Negative for agitation.       Objective:   Physical Exam  Constitutional: She is oriented to person, place, and time. She appears well-developed and well-nourished. No distress.  HENT:  Head: Normocephalic and atraumatic.  Mouth/Throat: Oropharynx is clear and moist.  Eyes: Pupils are equal, round, and reactive to light.  Neck: Normal range of motion. Neck supple. No thyromegaly present.  Cardiovascular: Normal rate and regular rhythm.   Murmur heard. Pulmonary/Chest: Effort normal and breath sounds normal.  Abdominal: Soft. Bowel sounds are normal.  Musculoskeletal: Normal range of motion. She exhibits no edema and no tenderness.  Neurological: She is alert and oriented to person, place, and time. No cranial nerve deficit.  Skin: Skin is warm and dry. She is not diaphoretic.  Psychiatric: She has a  normal mood and affect.   BP 144/82  Pulse 67  Temp(Src) 97.9 F (36.6 C) (Oral)  Resp 16  Ht 5' 2.5" (1.588 m)  Wt 162 lb 12.8 oz (73.846 kg)  BMI 29.28 kg/m2    Assessment & Plan:  Osteoporosis, unspecified Taking ca-vit d with yearly reclast. Will check her renal function as last renal function check was more than a year ago. Upon review of labs, will get appointment scheduled for reclast injection. Pt understands and agrees.  Unspecified constipation Improved. Will continue sennosides abd miralax for now  Insomnia Taking zolpidem prn and amitryptylline for now, sleeping better. Mood has been stable. Monitor for now  HTN (hypertension) Continue hctz for now.   GERD (gastroesophageal reflux disease) Continue omeprazole

## 2012-06-06 NOTE — Assessment & Plan Note (Signed)
Taking zolpidem prn and amitryptylline for now, sleeping better. Mood has been stable. Monitor for now

## 2012-06-06 NOTE — Patient Instructions (Signed)
Once your blood work results are out, office will call you and have your appointment scheduled for your injection medication for your bones

## 2012-06-07 ENCOUNTER — Encounter: Payer: Self-pay | Admitting: Cardiovascular Disease

## 2012-06-08 LAB — CBC WITH DIFFERENTIAL/PLATELET
Basophils Absolute: 0 10*3/uL (ref 0.0–0.2)
Basos: 0 % (ref 0–3)
Eos: 1 % (ref 0–5)
Eosinophils Absolute: 0.1 10*3/uL (ref 0.0–0.4)
HCT: 36.5 % (ref 34.0–46.6)
Hemoglobin: 11.9 g/dL (ref 11.1–15.9)
Immature Grans (Abs): 0 10*3/uL (ref 0.0–0.1)
Immature Granulocytes: 0 % (ref 0–2)
Lymphocytes Absolute: 2.1 10*3/uL (ref 0.7–3.1)
Lymphs: 23 % (ref 14–46)
MCH: 31.2 pg (ref 26.6–33.0)
MCHC: 32.6 g/dL (ref 31.5–35.7)
MCV: 96 fL (ref 79–97)
Monocytes Absolute: 0.6 10*3/uL (ref 0.1–0.9)
Monocytes: 7 % (ref 4–12)
Neutrophils Absolute: 6.1 10*3/uL (ref 1.4–7.0)
Neutrophils Relative %: 69 % (ref 40–74)
RBC: 3.82 x10E6/uL (ref 3.77–5.28)
RDW: 13.7 % (ref 12.3–15.4)
WBC: 8.9 10*3/uL (ref 3.4–10.8)

## 2012-06-08 LAB — COMPREHENSIVE METABOLIC PANEL
ALT: 14 IU/L (ref 0–32)
AST: 18 IU/L (ref 0–40)
Albumin/Globulin Ratio: 2 (ref 1.1–2.5)
Albumin: 4.3 g/dL (ref 3.5–4.8)
Alkaline Phosphatase: 63 IU/L (ref 39–117)
BUN/Creatinine Ratio: 10 — ABNORMAL LOW (ref 11–26)
BUN: 11 mg/dL (ref 8–27)
CO2: 28 mmol/L (ref 19–28)
Calcium: 9.7 mg/dL (ref 8.6–10.2)
Chloride: 100 mmol/L (ref 97–108)
Creatinine, Ser: 1.09 mg/dL — ABNORMAL HIGH (ref 0.57–1.00)
GFR calc Af Amer: 56 mL/min/{1.73_m2} — ABNORMAL LOW (ref >59)
GFR calc non Af Amer: 48 mL/min/{1.73_m2} — ABNORMAL LOW (ref >59)
Globulin, Total: 2.1 g/dL (ref 1.5–4.5)
Glucose: 94 mg/dL (ref 65–99)
Potassium: 3.8 mmol/L (ref 3.5–5.2)
Sodium: 140 mmol/L (ref 134–144)
Total Bilirubin: 0.2 mg/dL (ref 0.0–1.2)
Total Protein: 6.4 g/dL (ref 6.0–8.5)

## 2012-06-08 LAB — VITAMIN D 1,25 DIHYDROXY: Vit D, 1,25-Dihydroxy: 51.6 pg/mL (ref 10.0–75.0)

## 2012-06-09 ENCOUNTER — Telehealth: Payer: Self-pay | Admitting: *Deleted

## 2012-06-09 NOTE — Telephone Encounter (Signed)
Reclast Infusion Appointment set up at [facility] # [phone] Faxed paperwork to [facility] fax# [phone] Appointment set up for 06/13/2012 @ 10:00am Patient Aware

## 2012-06-12 ENCOUNTER — Other Ambulatory Visit (HOSPITAL_COMMUNITY): Payer: Self-pay | Admitting: *Deleted

## 2012-06-13 ENCOUNTER — Encounter (HOSPITAL_COMMUNITY): Payer: 59

## 2012-06-20 ENCOUNTER — Encounter (HOSPITAL_COMMUNITY)
Admission: RE | Admit: 2012-06-20 | Discharge: 2012-06-20 | Disposition: A | Discharge status: Home / Self Care | Payer: Medicare Other | Source: Ambulatory Visit | Attending: Internal Medicine | Admitting: Internal Medicine

## 2012-06-20 DIAGNOSIS — M810 Age-related osteoporosis without current pathological fracture: Secondary | ICD-10-CM | POA: Insufficient documentation

## 2012-06-20 MED ORDER — ZOLEDRONIC ACID 5 MG/100ML IV SOLN
5.0000 mg | Freq: Once | INTRAVENOUS | Status: AC
  Administered 2012-06-20: 5 mg via INTRAVENOUS

## 2012-06-26 DIAGNOSIS — R351 Nocturia: Secondary | ICD-10-CM | POA: Diagnosis not present

## 2012-06-26 DIAGNOSIS — N3946 Mixed incontinence: Secondary | ICD-10-CM | POA: Diagnosis not present

## 2012-06-29 DIAGNOSIS — Z96649 Presence of unspecified artificial hip joint: Secondary | ICD-10-CM | POA: Insufficient documentation

## 2012-07-03 DIAGNOSIS — I10 Essential (primary) hypertension: Secondary | ICD-10-CM | POA: Diagnosis not present

## 2012-07-03 DIAGNOSIS — I061 Rheumatic aortic insufficiency: Secondary | ICD-10-CM | POA: Diagnosis not present

## 2012-07-04 ENCOUNTER — Other Ambulatory Visit (HOSPITAL_COMMUNITY): Payer: Self-pay | Admitting: Cardiovascular Disease

## 2012-07-04 DIAGNOSIS — R0989 Other specified symptoms and signs involving the circulatory and respiratory systems: Secondary | ICD-10-CM

## 2012-07-04 DIAGNOSIS — I350 Nonrheumatic aortic (valve) stenosis: Secondary | ICD-10-CM

## 2012-07-11 ENCOUNTER — Other Ambulatory Visit: Payer: Self-pay | Admitting: Cardiovascular Disease

## 2012-07-11 DIAGNOSIS — Z79899 Other long term (current) drug therapy: Secondary | ICD-10-CM | POA: Diagnosis not present

## 2012-07-11 DIAGNOSIS — E782 Mixed hyperlipidemia: Secondary | ICD-10-CM | POA: Diagnosis not present

## 2012-07-11 LAB — HEPATIC FUNCTION PANEL
ALT: 12 U/L (ref 0–35)
AST: 18 U/L (ref 0–37)
Albumin: 4.1 g/dL (ref 3.5–5.2)
Alkaline Phosphatase: 65 U/L (ref 39–117)
Bilirubin, Direct: 0.1 mg/dL (ref 0.0–0.3)
Indirect Bilirubin: 0.2 mg/dL (ref 0.0–0.9)
Total Bilirubin: 0.3 mg/dL (ref 0.3–1.2)
Total Protein: 6.4 g/dL (ref 6.0–8.3)

## 2012-07-11 LAB — LIPID PANEL
Cholesterol: 156 mg/dL (ref 0–200)
HDL: 41 mg/dL (ref >39)
LDL Cholesterol: 84 mg/dL (ref 0–99)
Total CHOL/HDL Ratio: 3.8 Ratio
Triglycerides: 156 mg/dL — ABNORMAL HIGH (ref <150)
VLDL: 31 mg/dL (ref 0–40)

## 2012-07-17 ENCOUNTER — Telehealth (HOSPITAL_COMMUNITY): Payer: Self-pay | Admitting: Cardiovascular Disease

## 2012-07-17 NOTE — Telephone Encounter (Signed)
Lmom.  i cannot find where i called her on Friday.  I lm that if she needed anything to give me a call back

## 2012-07-17 NOTE — Telephone Encounter (Signed)
i called patient and lm about lipid results

## 2012-07-17 NOTE — Telephone Encounter (Signed)
Pt returning call from Echo on Friday

## 2012-07-17 NOTE — Telephone Encounter (Signed)
Message forwarded to K. Vogel, RN.  

## 2012-07-18 ENCOUNTER — Ambulatory Visit (HOSPITAL_BASED_OUTPATIENT_CLINIC_OR_DEPARTMENT_OTHER)
Admission: RE | Admit: 2012-07-18 | Discharge: 2012-07-18 | Disposition: A | Discharge status: Home / Self Care | Payer: Medicare Other | Source: Ambulatory Visit | Attending: Cardiovascular Disease | Admitting: Cardiovascular Disease

## 2012-07-18 ENCOUNTER — Ambulatory Visit (HOSPITAL_COMMUNITY)
Admission: RE | Admit: 2012-07-18 | Discharge: 2012-07-18 | Disposition: A | Discharge status: Home / Self Care | Payer: Medicare Other | Source: Ambulatory Visit | Attending: Internal Medicine | Admitting: Internal Medicine

## 2012-07-18 DIAGNOSIS — R6884 Jaw pain: Secondary | ICD-10-CM | POA: Insufficient documentation

## 2012-07-18 DIAGNOSIS — R0609 Other forms of dyspnea: Secondary | ICD-10-CM | POA: Diagnosis not present

## 2012-07-18 DIAGNOSIS — I6529 Occlusion and stenosis of unspecified carotid artery: Secondary | ICD-10-CM

## 2012-07-18 DIAGNOSIS — I059 Rheumatic mitral valve disease, unspecified: Secondary | ICD-10-CM | POA: Diagnosis not present

## 2012-07-18 DIAGNOSIS — I359 Nonrheumatic aortic valve disorder, unspecified: Secondary | ICD-10-CM | POA: Insufficient documentation

## 2012-07-18 DIAGNOSIS — I350 Nonrheumatic aortic (valve) stenosis: Secondary | ICD-10-CM

## 2012-07-18 DIAGNOSIS — R0989 Other specified symptoms and signs involving the circulatory and respiratory systems: Secondary | ICD-10-CM

## 2012-07-18 DIAGNOSIS — I379 Nonrheumatic pulmonary valve disorder, unspecified: Secondary | ICD-10-CM | POA: Insufficient documentation

## 2012-07-18 DIAGNOSIS — I517 Cardiomegaly: Secondary | ICD-10-CM | POA: Insufficient documentation

## 2012-07-18 DIAGNOSIS — R011 Cardiac murmur, unspecified: Secondary | ICD-10-CM | POA: Diagnosis not present

## 2012-07-18 DIAGNOSIS — I079 Rheumatic tricuspid valve disease, unspecified: Secondary | ICD-10-CM | POA: Diagnosis not present

## 2012-07-18 NOTE — Progress Notes (Signed)
Carotid artery duplex doppler was completed. Britnee Mcdevitt RVT 

## 2012-07-18 NOTE — Progress Notes (Signed)
2D Echo Performed 07/18/2012    Giulianna Rocha, RCS  

## 2012-07-20 ENCOUNTER — Encounter: Payer: Self-pay | Admitting: Cardiovascular Disease

## 2012-07-20 ENCOUNTER — Ambulatory Visit (INDEPENDENT_AMBULATORY_CARE_PROVIDER_SITE_OTHER): Payer: Medicare Other | Admitting: Cardiovascular Disease

## 2012-07-20 VITALS — BP 148/72 | HR 80 | Ht 63.5 in | Wt 162.0 lb

## 2012-07-20 DIAGNOSIS — I350 Nonrheumatic aortic (valve) stenosis: Secondary | ICD-10-CM

## 2012-07-20 DIAGNOSIS — R0989 Other specified symptoms and signs involving the circulatory and respiratory systems: Secondary | ICD-10-CM | POA: Diagnosis not present

## 2012-07-20 DIAGNOSIS — I359 Nonrheumatic aortic valve disorder, unspecified: Secondary | ICD-10-CM

## 2012-07-20 DIAGNOSIS — D689 Coagulation defect, unspecified: Secondary | ICD-10-CM

## 2012-07-20 DIAGNOSIS — I10 Essential (primary) hypertension: Secondary | ICD-10-CM | POA: Diagnosis not present

## 2012-07-20 DIAGNOSIS — Z79899 Other long term (current) drug therapy: Secondary | ICD-10-CM

## 2012-07-20 HISTORY — DX: Nonrheumatic aortic (valve) stenosis: I35.0

## 2012-07-20 NOTE — Patient Instructions (Signed)
  Your physician has requested that you have a cardiac catheterization. Cardiac catheterization is used to diagnose and/or treat various heart conditions. Doctors may recommend this procedure for a number of different reasons. The most common reason is to evaluate chest pain. Chest pain can be a symptom of coronary artery disease (CAD), and cardiac catheterization can show whether plaque is narrowing or blocking your heart's arteries. This procedure is also used to evaluate the valves, as well as measure the blood flow and oxygen levels in different parts of your heart. For further information please visit [email or web address]   Following your catheterization, you will not be allowed to drive for 3 days.  No lifting, pushing, or pulling greater that 10 pounds is allowed for 1 week.  When the procedure is scheduled, you will be given a date to have bloodwork and a chest xray done.     DR BERRY HAS ALSO ORDERED CAROTID DOPPLERS TO LOOK AT THE BLOODFLOW IN YOUR NECK ARTERIES

## 2012-07-20 NOTE — Progress Notes (Signed)
07/20/2012 [name]   1931-12-04  [id number]  Primary Physician Terald Sleeper, MD Primary Cardiologist: Runell Gess MD [name]  HPI:  The patient is a 79 year old mildly overweight married Caucasian female accompanied by her husband, who is also a patient of mine. She is a mother of 2, grandmother to 1 grandchild. I saw her a year ago after they had celebrated their 60th wedding anniversary. Her risk factors include hypertension and family history. A brother died at age 96 from heart-related issues while he was being operated on. She has never had a heart attack or stroke. She does have moderate aortic stenosis by 2D echocardiogram last performed a year ago with a valve area of 0.83 cm2, peak gradient of 57, and mean of 35. She had negative Myoview on August 13, 2010. Since I saw her last, she developed exertional jaw pain, which was fairly reproducible. Her last lipid profile a year ago was excellent with total cholesterol 166, LDL 89, HDL 44. 2-D echo was performed showed critical aortic stenosis the valve area 0.5 cm. Based on this I believe the patient needs a right left heart catheter for all 4 aortic valve replacement with a thoracic surgeon. I think she is acceptable risk for surgical valve replacement and probably doesn't meet criteria for TAVR.    Current Outpatient Prescriptions  Medication Sig Dispense Refill  . acetaminophen (TYLENOL) 500 MG tablet Take 500 mg by mouth as needed for pain.      . Biotin 5000 MCG CAPS Take 1 capsule by mouth daily.      . Calcium Citrate-Vitamin D (CITRACAL + D PO) Take by mouth 2 (two) times daily.      . fesoterodine (TOVIAZ) 8 MG TB24 Take 8 mg by mouth daily.      . fish oil-omega-3 fatty acids 1000 MG capsule Take 2 g by mouth daily.      . hydrochlorothiazide 25 MG tablet Take 25 mg by mouth daily.        . hydrOXYzine (ATARAX/VISTARIL) 25 MG tablet Take 25 mg by mouth as needed for itching.      . Magnesium  Hydroxide (PHILLIPS MILK OF MAGNESIA PO) Take by mouth daily.      . metoprolol succinate (TOPROL-XL) 25 MG 24 hr tablet Take 25 mg by mouth. 1/2 tablet daily      . omeprazole (PRILOSEC) 20 MG capsule Take 40 mg by mouth daily.       . Polyethylene Glycol 3350 (MIRALAX PO) Take by mouth.        . senna (SENOKOT) 8.6 MG TABS Take 1 tablet by mouth daily as needed.      . vitamin C (ASCORBIC ACID) 500 MG tablet Take 500 mg by mouth daily.      . vitamin E 400 UNIT capsule Take 400 Units by mouth daily.      . zoledronic acid (RECLAST) 5 MG/100ML SOLN Inject 100 mLs (5 mg total) into the vein once.  100 mL  0  . zolpidem (AMBIEN) 10 MG tablet Take 10 mg by mouth at bedtime as needed for sleep.       No current facility-administered medications for this visit.    Allergies  Allergen Reactions  . Codeine Rash    All over the body.    History   Social History  . Marital Status: Married    Spouse Name: N/A    Number of Children: N/A  . Years of Education: N/A  Occupational History  . Not on file.   Social History Main Topics  . Smoking status: Never Smoker   . Smokeless tobacco: Never Used  . Alcohol Use: No  . Drug Use: No  . Sexually Active: Not on file   Other Topics Concern  . Not on file   Social History Narrative  . No narrative on file     Review of Systems: General: negative for chills, fever, night sweats or weight changes.  Cardiovascular: negative for chest pain, dyspnea on exertion, edema, orthopnea, palpitations, paroxysmal nocturnal dyspnea or shortness of breath Dermatological: negative for rash Respiratory: negative for cough or wheezing Urologic: negative for hematuria Abdominal: negative for nausea, vomiting, diarrhea, bright red blood per rectum, melena, or hematemesis Neurologic: negative for visual changes, syncope, or dizziness All other systems reviewed and are otherwise negative except as noted above.    Blood pressure 148/72, pulse 80,  height 5' 3.5" (1.613 m), weight 162 lb (73.483 kg).  General appearance: alert and no distress Neck: no adenopathy, no JVD, supple, symmetrical, trachea midline, thyroid not enlarged, symmetric, no tenderness/mass/nodules and bilateral bruits Lungs: clear to auscultation bilaterally Heart: typical aortic stenosis murmur Extremities: extremities normal, atraumatic, no cyanosis or edema  EKG not performed today  ASSESSMENT AND PLAN:   Aortic stenosis Recent 2-D echo shows severe aortic stenosis without aortic cath a 0.5 cm. She has preserved left jugular function and lethargy for hypertrophy. She does complain of dyspnea. Based on this I recommended that she undergo a right left heart cath at home and ultimately referral to Dr. Holley Raring for consideration of aortic valve replacement.  HTN (hypertension) Well-controlled on a diuretic      Runell Gess MD [facility], [facility] 07/20/2012 12:13 PM

## 2012-07-20 NOTE — Assessment & Plan Note (Signed)
Well-controlled on a diuretic

## 2012-07-20 NOTE — Assessment & Plan Note (Addendum)
Recent 2-D echo shows severe aortic stenosis without aortic cath a 0.5 cm. She has preserved left jugular function and lethargy for hypertrophy. She does complain of dyspnea. Based on this I recommended that she undergo a right left heart cath at home and ultimately referral to Dr. Holley Raring for consideration of aortic valve replacement.

## 2012-07-26 ENCOUNTER — Ambulatory Visit: Payer: Medicare Other | Admitting: Cardiovascular Disease

## 2012-07-26 NOTE — Progress Notes (Signed)
LMOM

## 2012-07-27 ENCOUNTER — Telehealth: Payer: Self-pay | Admitting: Cardiovascular Disease

## 2012-07-27 NOTE — Telephone Encounter (Signed)
lmom 

## 2012-07-27 NOTE — Telephone Encounter (Signed)
[name],returning your call from yesterday! °

## 2012-07-28 NOTE — Telephone Encounter (Signed)
Spoke with patient.

## 2012-07-28 NOTE — Telephone Encounter (Signed)
Kathryn,returning your call from today!

## 2012-07-31 ENCOUNTER — Encounter (HOSPITAL_COMMUNITY): Payer: Self-pay | Admitting: Pharmacy Technician

## 2012-08-02 ENCOUNTER — Encounter (HOSPITAL_COMMUNITY): Payer: Medicare Other

## 2012-08-08 ENCOUNTER — Ambulatory Visit
Admission: RE | Admit: 2012-08-08 | Discharge: 2012-08-08 | Disposition: A | Discharge status: Home / Self Care | Payer: Medicare Other | Source: Ambulatory Visit | Attending: Cardiovascular Disease | Admitting: Cardiovascular Disease

## 2012-08-08 ENCOUNTER — Other Ambulatory Visit: Payer: Self-pay | Admitting: *Deleted

## 2012-08-08 DIAGNOSIS — Z79899 Other long term (current) drug therapy: Secondary | ICD-10-CM | POA: Diagnosis not present

## 2012-08-08 DIAGNOSIS — I350 Nonrheumatic aortic (valve) stenosis: Secondary | ICD-10-CM

## 2012-08-08 DIAGNOSIS — D689 Coagulation defect, unspecified: Secondary | ICD-10-CM | POA: Diagnosis not present

## 2012-08-08 DIAGNOSIS — R0602 Shortness of breath: Secondary | ICD-10-CM | POA: Diagnosis not present

## 2012-08-08 LAB — CBC
HCT: 33.3 % — ABNORMAL LOW (ref 36.0–46.0)
Hemoglobin: 11.8 g/dL — ABNORMAL LOW (ref 12.0–15.0)
MCH: 31.6 pg (ref 26.0–34.0)
MCHC: 35.4 g/dL (ref 30.0–36.0)
MCV: 89 fL (ref 78.0–100.0)
Platelets: 229 10*3/uL (ref 150–400)
RBC: 3.74 MIL/uL — ABNORMAL LOW (ref 3.87–5.11)
RDW: 13.5 % (ref 11.5–15.5)
WBC: 6.7 10*3/uL (ref 4.0–10.5)

## 2012-08-08 LAB — BASIC METABOLIC PANEL
BUN: 11 mg/dL (ref 6–23)
CO2: 27 mEq/L (ref 19–32)
Calcium: 9.2 mg/dL (ref 8.4–10.5)
Chloride: 102 mEq/L (ref 96–112)
Creat: 0.9 mg/dL (ref 0.50–1.10)
Glucose, Bld: 99 mg/dL (ref 70–99)
Potassium: 3.6 mEq/L (ref 3.5–5.3)
Sodium: 140 mEq/L (ref 135–145)

## 2012-08-08 LAB — APTT: aPTT: 34 seconds (ref 24–37)

## 2012-08-08 LAB — PROTIME-INR
INR: 0.9 (ref <1.50)
Prothrombin Time: 12.2 seconds (ref 11.6–15.2)

## 2012-08-08 LAB — TSH: TSH: 1.366 u[IU]/mL (ref 0.350–4.500)

## 2012-08-08 MED ORDER — METOPROLOL SUCCINATE ER 25 MG PO TB24: 12.5000 mg | ORAL_TABLET | Freq: Every day | ORAL | Status: DC

## 2012-08-09 ENCOUNTER — Other Ambulatory Visit: Payer: Self-pay | Admitting: *Deleted

## 2012-08-09 DIAGNOSIS — Z01818 Encounter for other preprocedural examination: Secondary | ICD-10-CM

## 2012-08-11 DIAGNOSIS — J385 Laryngeal spasm: Secondary | ICD-10-CM | POA: Diagnosis not present

## 2012-08-14 ENCOUNTER — Ambulatory Visit (HOSPITAL_COMMUNITY)
Admission: RE | Admit: 2012-08-14 | Discharge: 2012-08-15 | Disposition: A | Discharge status: Home / Self Care | Payer: Medicare Other | Source: Ambulatory Visit | Attending: Cardiovascular Disease | Admitting: Cardiovascular Disease

## 2012-08-14 ENCOUNTER — Other Ambulatory Visit: Payer: Self-pay | Admitting: *Deleted

## 2012-08-14 ENCOUNTER — Encounter (HOSPITAL_COMMUNITY): Payer: Self-pay | Admitting: Thoracic Surgery (Cardiothoracic Vascular Surgery)

## 2012-08-14 ENCOUNTER — Encounter (HOSPITAL_COMMUNITY): Payer: Medicare Other

## 2012-08-14 ENCOUNTER — Encounter (HOSPITAL_COMMUNITY)
Admission: RE | Disposition: A | Discharge status: Home / Self Care | Payer: Self-pay | Source: Ambulatory Visit | Attending: Cardiovascular Disease

## 2012-08-14 DIAGNOSIS — R0989 Other specified symptoms and signs involving the circulatory and respiratory systems: Secondary | ICD-10-CM | POA: Insufficient documentation

## 2012-08-14 DIAGNOSIS — E663 Overweight: Secondary | ICD-10-CM | POA: Insufficient documentation

## 2012-08-14 DIAGNOSIS — R6884 Jaw pain: Secondary | ICD-10-CM | POA: Insufficient documentation

## 2012-08-14 DIAGNOSIS — J449 Chronic obstructive pulmonary disease, unspecified: Secondary | ICD-10-CM | POA: Diagnosis present

## 2012-08-14 DIAGNOSIS — J4489 Other specified chronic obstructive pulmonary disease: Secondary | ICD-10-CM | POA: Insufficient documentation

## 2012-08-14 DIAGNOSIS — Z853 Personal history of malignant neoplasm of breast: Secondary | ICD-10-CM | POA: Diagnosis not present

## 2012-08-14 DIAGNOSIS — R079 Chest pain, unspecified: Secondary | ICD-10-CM | POA: Diagnosis present

## 2012-08-14 DIAGNOSIS — I10 Essential (primary) hypertension: Secondary | ICD-10-CM | POA: Diagnosis not present

## 2012-08-14 DIAGNOSIS — R0609 Other forms of dyspnea: Secondary | ICD-10-CM | POA: Diagnosis not present

## 2012-08-14 DIAGNOSIS — I350 Nonrheumatic aortic (valve) stenosis: Secondary | ICD-10-CM | POA: Diagnosis present

## 2012-08-14 DIAGNOSIS — Z01818 Encounter for other preprocedural examination: Secondary | ICD-10-CM

## 2012-08-14 DIAGNOSIS — Z23 Encounter for immunization: Secondary | ICD-10-CM | POA: Insufficient documentation

## 2012-08-14 DIAGNOSIS — I359 Nonrheumatic aortic valve disorder, unspecified: Secondary | ICD-10-CM

## 2012-08-14 DIAGNOSIS — K219 Gastro-esophageal reflux disease without esophagitis: Secondary | ICD-10-CM | POA: Diagnosis present

## 2012-08-14 DIAGNOSIS — R0600 Dyspnea, unspecified: Secondary | ICD-10-CM

## 2012-08-14 DIAGNOSIS — IMO0001 Reserved for inherently not codable concepts without codable children: Secondary | ICD-10-CM

## 2012-08-14 HISTORY — DX: Nonrheumatic aortic (valve) stenosis: I35.0

## 2012-08-14 HISTORY — PX: LEFT AND RIGHT HEART CATHETERIZATION WITH CORONARY ANGIOGRAM: SHX5449

## 2012-08-14 HISTORY — PX: LEFT HEART CATH: SHX5946

## 2012-08-14 LAB — POCT I-STAT 3, ART BLOOD GAS (G3+)
Acid-Base Excess: 2 mmol/L (ref 0.0–2.0)
Bicarbonate: 26.6 mEq/L — ABNORMAL HIGH (ref 20.0–24.0)
O2 Saturation: 95 %
TCO2: 28 mmol/L (ref 0–100)
pCO2 arterial: 38.9 mmHg (ref 35.0–45.0)
pH, Arterial: 7.443 (ref 7.350–7.450)
pO2, Arterial: 71 mmHg — ABNORMAL LOW (ref 80.0–100.0)

## 2012-08-14 LAB — POCT I-STAT 3, VENOUS BLOOD GAS (G3P V)
Acid-Base Excess: 1 mmol/L (ref 0.0–2.0)
Bicarbonate: 26.2 mEq/L — ABNORMAL HIGH (ref 20.0–24.0)
O2 Saturation: 59 %
TCO2: 28 mmol/L (ref 0–100)
pCO2, Ven: 45.4 mmHg (ref 45.0–50.0)
pH, Ven: 7.369 — ABNORMAL HIGH (ref 7.250–7.300)
pO2, Ven: 32 mmHg (ref 30.0–45.0)

## 2012-08-14 SURGERY — LEFT AND RIGHT HEART CATHETERIZATION WITH CORONARY ANGIOGRAM
Anesthesia: LOCAL

## 2012-08-14 MED ORDER — PANTOPRAZOLE SODIUM 40 MG PO TBEC
40.0000 mg | DELAYED_RELEASE_TABLET | Freq: Every day | ORAL | Status: DC
  Administered 2012-08-14 – 2012-08-15 (×2): 40 mg via ORAL
  Filled 2012-08-14 (×2): qty 1

## 2012-08-14 MED ORDER — HEPARIN (PORCINE) IN NACL 2-0.9 UNIT/ML-% IJ SOLN
INTRAMUSCULAR | Status: AC
  Filled 2012-08-14: qty 1000

## 2012-08-14 MED ORDER — MAGNESIUM HYDROXIDE 400 MG/5ML PO SUSP
30.0000 mL | Freq: Every day | ORAL | Status: DC | PRN
  Administered 2012-08-14: 30 mL via ORAL
  Filled 2012-08-14: qty 30

## 2012-08-14 MED ORDER — LIDOCAINE HCL (PF) 1 % IJ SOLN
INTRAMUSCULAR | Status: AC
  Filled 2012-08-14: qty 30

## 2012-08-14 MED ORDER — ASPIRIN 81 MG PO CHEW
324.0000 mg | CHEWABLE_TABLET | ORAL | Status: AC
  Administered 2012-08-14: 324 mg via ORAL
  Filled 2012-08-14: qty 4

## 2012-08-14 MED ORDER — PNEUMOCOCCAL VAC POLYVALENT 25 MCG/0.5ML IJ INJ
0.5000 mL | INJECTION | INTRAMUSCULAR | Status: AC
  Administered 2012-08-15: 0.5 mL via INTRAMUSCULAR
  Filled 2012-08-14: qty 0.5

## 2012-08-14 MED ORDER — ACTIVE PARTNERSHIP FOR HEALTH OF YOUR HEART BOOK
Freq: Once | Status: AC
  Administered 2012-08-14: 23:00:00
  Filled 2012-08-14: qty 1

## 2012-08-14 MED ORDER — METOPROLOL SUCCINATE 12.5 MG HALF TABLET
12.5000 mg | ORAL_TABLET | Freq: Every day | ORAL | Status: DC
  Administered 2012-08-14 – 2012-08-15 (×2): 12.5 mg via ORAL
  Filled 2012-08-14 (×2): qty 1

## 2012-08-14 MED ORDER — ACETAMINOPHEN 325 MG PO TABS: 650.0000 mg | ORAL_TABLET | ORAL | Status: DC | PRN

## 2012-08-14 MED ORDER — SODIUM CHLORIDE 0.9 % IV SOLN
INTRAVENOUS | Status: DC
  Administered 2012-08-14: 75 mL/h via INTRAVENOUS

## 2012-08-14 MED ORDER — ZOLPIDEM TARTRATE 5 MG PO TABS: 10.0000 mg | ORAL_TABLET | Freq: Every evening | ORAL | Status: DC | PRN

## 2012-08-14 MED ORDER — HYDROCHLOROTHIAZIDE 25 MG PO TABS
25.0000 mg | ORAL_TABLET | Freq: Every day | ORAL | Status: DC
  Administered 2012-08-14 – 2012-08-15 (×2): 25 mg via ORAL
  Filled 2012-08-14 (×2): qty 1

## 2012-08-14 MED ORDER — SODIUM CHLORIDE 0.9 % IJ SOLN: 3.0000 mL | INTRAMUSCULAR | Status: DC | PRN

## 2012-08-14 MED ORDER — ONDANSETRON HCL 4 MG/2ML IJ SOLN: 4.0000 mg | Freq: Four times a day (QID) | INTRAMUSCULAR | Status: DC | PRN

## 2012-08-14 MED ORDER — SODIUM CHLORIDE 0.9 % IV SOLN: INTRAVENOUS | Status: AC

## 2012-08-14 MED ORDER — DIAZEPAM 5 MG PO TABS
5.0000 mg | ORAL_TABLET | ORAL | Status: AC
  Administered 2012-08-14: 5 mg via ORAL
  Filled 2012-08-14: qty 1

## 2012-08-14 MED ORDER — ZOLPIDEM TARTRATE 5 MG PO TABS: 5.0000 mg | ORAL_TABLET | Freq: Every evening | ORAL | Status: DC | PRN

## 2012-08-14 MED ORDER — LORATADINE 10 MG PO TABS
10.0000 mg | ORAL_TABLET | Freq: Every day | ORAL | Status: DC
  Administered 2012-08-14 – 2012-08-15 (×2): 10 mg via ORAL
  Filled 2012-08-14 (×2): qty 1

## 2012-08-14 NOTE — H&P (Signed)
    Pt was reexamined and existing H & P reviewed. No changes found.  Runell Gess, MD [facility] 08/14/2012 9:04 AM

## 2012-08-14 NOTE — CV Procedure (Signed)
[name] is a 79 y.o. female    [id number] LOCATION:  FACILITY: [facility]  PHYSICIAN: [name], M.D. Nov 30, 1931   DATE OF PROCEDURE:  08/14/2012  DATE OF DISCHARGE:   CARDIAC CATHETERIZATION     History obtained from chart review.The patient is a 79 year old mildly overweight married Caucasian female accompanied by her husband, who is also a patient of mine. She is a mother of 2, grandmother to 1 grandchild. I saw her a year ago after they had celebrated their 60th wedding anniversary. Her risk factors include hypertension and family history. A brother died at age 77 from heart-related issues while he was being operated on. She has never had a heart attack or stroke. She does have moderate aortic stenosis by 2D echocardiogram last performed a year ago with a valve area of 0.83 cm2, peak gradient of 57, and mean of 35. She had negative Myoview on August 13, 2010. Since I saw her last, she developed exertional jaw pain, which was fairly reproducible. Her last lipid profile a year ago was excellent with total cholesterol 166, LDL 89, HDL 44. 2-D echo was performed showed critical aortic stenosis the valve area 0.5 cm. Based on this I believe the patient needs a right left heart catheter for all 4 aortic valve replacement with a thoracic surgeon. I think she is acceptable risk for surgical valve replacement and probably doesn't meet criteria for TAVR.    PROCEDURE DESCRIPTION:    The patient was brought to the second floor [facility] [facility] cath lab in the postabsorptive state. She was premedicated with Valium 5 mg by mouth. Her right groin was prepped and shaved in usual sterile fashion. Xylocaine 1% was used for local anesthesia. A 5 French sheath was inserted into the right common femoral  artery using standard Seldinger technique. A 7 French sheath was inserted into the right common femoral vein using standard Seldinger technique. A 7 French balloon tip thermal dilution Swan-Ganz  catheter was advanced right heart chambers obtaining pressures and blood samples to obtain Fick and thermodilution cardiac outputs. 5 French right and left Judkins diagnostic catheters along with a 5 [place] AL-1 catheter were used for selective coronary angiography pertaining pullback pressures. Left ventriculography was not performed to conserve contrast and because a 2-D echo already been done. Visipaque dye was used for the entirety of the case (30 cc administered to the patient), retrograde aortic, left ventricular end pullback pressures were recorded.   HEMODYNAMICS:    AO SYSTOLIC/AO DIASTOLIC: 168/69   LV SYSTOLIC/LV DIASTOLIC: 231/20 (end-diastolic pressure 30)  Right atrial pressure: 14/12  Right ventricular pressure: 34/13  Pulmonary artery pressure: 34/16, mean 27  Pulmonary capillary wedge pressure: 22/21, mean 18  Cardiac output 4.1 L per minute (Fick), cardiac index 2.3 L per minute per meter squared  Aortic gradient : 63 mmHg peak to peak  Aortic valve area: 0.37 cm      ANGIOGRAPHIC RESULTS:   1. Left main; normal  2. LAD; normal 3. Left circumflex; normal.  4. Right coronary artery; dominant and normal 5. Left ventriculography; not performed today  IMPRESSION:[name] has normal coronary arteries and critical aortic stenosis. Her valve area is 0.37 cm. I believe she is a good candidate for aortic valve replacement with a bioprosthesis. The sheath were removed and pressure was held on the groin to achieve hemostasis. The patient left the Cath Lab in stable condition.[facility]  has been notified for surgical consultation. The patient will remain in the hospital overnight  for observation and will be discharged home in the morning.  Runell Gess MD, [facility] 08/14/2012 9:43 AM

## 2012-08-14 NOTE — Consult Note (Signed)
[address]       [name] [address]             [phone]          CARDIOTHORACIC SURGERY CONSULTATION REPORT  PCP is Terald Sleeper, MD Referring Provider is BERRY, Delton See, MD   Reason for consultation:  Severe aortic stenosis  HPI:  Patient is a 79 year old married white female from [place] with known history of aortic stenosis and hypertension who has been followed by Dr. Allyson Sabal for several years. Her last previous echocardiogram demonstrated moderate to severe aortic stenosis with peak and mean transvalvular gradients estimated 57 and 35 mm mercury respectively with aortic valve area estimated 0.83 cm. At that time the patient remained asymptomatic. Over the past year the patient has developed progressive symptoms of exertional shortness of breath and substernal chest tightness radiating up to her throat and jaw.  Symptoms are always brought on with physical exertion and relieved by rest. The patient has had some mild dizzy spells without syncope. She denies any resting chest pain or shortness of breath. She denies any history of PND, orthopnea, or lower extremity edema.  She recently underwent a followup echocardiogram which demonstrated the presence of critical aortic stenosis with peak velocity across the valve measuring 5 m/sec corresponding to peak and mean transvalvular gradients estimated 102 and 63 mm mercury respectively.  Left ventricular size and systolic function remains normal with ejection fraction estimated 65-70%. The patient was brought in for elective  Cardiac catheterization earlier today by Dr. Allyson Sabal which confirmed the presence of critical aortic stenosis with peak to peak valve gradient measured 63 mm mercury and valve area estimated to be 0.37 cm2. She was found to have no significant coronary artery disease. Pulmonary artery pressures measured 34/16 with pulmonary catheter wedge pressure 18. The patient was admitted for overnight  observation following catheterization with tentative plans to to be discharged home tomorrow.  Cardiothoracic surgical consultation was requested.  Past Medical History  Diagnosis Date  . Hypertension   . Arthritis   . Hyperlipidemia   . Heart murmur   . Cough   . Constipation   . Hearing loss   . Change in voice   . Osteoporosis   . Cancer     Breast  . Aortic valve disorder 08/13/2011    ECHO - EF >55%; mild diastolic dysfunction; calcified aortic valve, not well visualized; mod/severe aortic stenosis w/ worsening gradients when compared to 2012  . PVD (peripheral vascular disease) 08/13/2010    R/P MV - normal pattern of perfusion in all regions, EF 76%; no significant wall abnormalities noted; normal perfusion study  . Carotid bruit 08/06/2008    Doppler - R ECA demonstrates noarrowing w/ elevated velocities consistent w/ >70% diameter reduction; R and L ICAs show no evidence of diameter reduction, significant tortuosity or vascular abnormality;   . Aortic stenosis 07/20/2012    Aortic stenosis     Past Surgical History  Procedure Laterality Date  . Breast lumpectomy  02/25/1997    right  . Mastectomy  09/29/95    left  . Total hip arthroplasty  09/2010  . Colon surgery  1959  . Abdominal hysterectomy      Partial  . Eye surgery  1997    Cataract surgery  . Biopsy shoulder Left 10/08/2005    shave biopsy  . Cosmetic surgery Left 1997    Breast implant    Family History  Problem Relation  Age of Onset  . Cancer Mother     Bladder  . Early death Father     Tractor accident  . Early death Brother     Tax inspector  . Early death Brother     during heart surgery    History   Social History  . Marital Status: Married    Spouse Name: N/A    Number of Children: N/A  . Years of Education: N/A   Occupational History  . Not on file.   Social History Main Topics  . Smoking status: Never Smoker   . Smokeless tobacco: Never Used  . Alcohol Use: No  . Drug Use: No  .  Sexually Active: Not on file   Other Topics Concern  . Not on file   Social History Narrative  . No narrative on file    Prior to Admission medications   Medication Sig Start Date End Date Taking? Authorizing Provider  acetaminophen (TYLENOL) 500 MG tablet Take 500 mg by mouth as needed for pain.   Yes Historical Provider, MD  Biotin 5000 MCG CAPS Take 1 capsule by mouth daily.   Yes Historical Provider, MD  Calcium Citrate-Vitamin D (CITRACAL + D PO) Take 1 tablet by mouth 2 (two) times daily.    Yes Historical Provider, MD  fesoterodine (TOVIAZ) 8 MG TB24 Take 8 mg by mouth daily.   Yes Historical Provider, MD  fexofenadine (ALLEGRA) 180 MG tablet Take 180 mg by mouth daily as needed (for allergies).   Yes Historical Provider, MD  fish oil-omega-3 fatty acids 1000 MG capsule Take 1 g by mouth daily.    Yes Historical Provider, MD  hydrochlorothiazide 25 MG tablet Take 25 mg by mouth daily.     Yes Historical Provider, MD  hydrOXYzine (ATARAX/VISTARIL) 25 MG tablet Take 25 mg by mouth as needed for itching.   Yes Historical Provider, MD  Magnesium Hydroxide (PHILLIPS MILK OF MAGNESIA PO) Take 15 mLs by mouth at bedtime.    Yes Historical Provider, MD  metoprolol succinate (TOPROL-XL) 25 MG 24 hr tablet Take 0.5 tablets (12.5 mg total) by mouth daily. 08/08/12  Yes Claudie Revering, NP  omeprazole (PRILOSEC) 20 MG capsule Take 40 mg by mouth daily.    Yes Historical Provider, MD  triamcinolone (NASACORT) 55 MCG/ACT nasal inhaler Place 2 sprays into the nose daily as needed (for allergies).   Yes Historical Provider, MD  vitamin C (ASCORBIC ACID) 500 MG tablet Take 500 mg by mouth daily.   Yes Historical Provider, MD  vitamin E 400 UNIT capsule Take 400 Units by mouth daily.   Yes Historical Provider, MD  zoledronic acid (RECLAST) 5 MG/100ML SOLN Inject 5 mg into the vein once. 06/06/12  Yes Mahima Pandey, MD  zolpidem (AMBIEN) 10 MG tablet Take 10 mg by mouth at bedtime as needed for sleep.    Yes Historical Provider, MD    Current Facility-Administered Medications  Medication Dose Route Frequency Provider Last Rate Last Dose  . acetaminophen (TYLENOL) tablet 650 mg  650 mg Oral Q4H PRN Runell Gess, MD      . hydrochlorothiazide (HYDRODIURIL) tablet 25 mg  25 mg Oral Daily Runell Gess, MD      . loratadine (CLARITIN) tablet 10 mg  10 mg Oral Daily Runell Gess, MD      . metoprolol succinate (TOPROL-XL) 24 hr tablet 12.5 mg  12.5 mg Oral Daily Runell Gess, MD      .  ondansetron (ZOFRAN) injection 4 mg  4 mg Intravenous Q6H PRN Runell Gess, MD      . pantoprazole (PROTONIX) EC tablet 40 mg  40 mg Oral Daily Runell Gess, MD      . zolpidem [facility]) tablet 5 mg  5 mg Oral QHS PRN Runell Gess, MD        Allergies  Allergen Reactions  . Codeine Rash    All over the body.      Review of Systems:   General:  normal appetite, decreased energy, no weight gain, no weight loss, no fever  Cardiac:  + chest pain with exertion, no chest pain at rest, + SOB with moderate exertion, no resting SOB, no PND, no orthopnea, no palpitations, no arrhythmia, no atrial fibrillation, no LE edema, + dizzy spells, no syncope  Respiratory:  + exertional shortness of breath, no home oxygen, no productive cough, no dry cough, no bronchitis, no wheezing, no hemoptysis, no asthma, no pain with inspiration or cough, no sleep apnea, no CPAP at night  GI:   no difficulty swallowing, no reflux, no frequent heartburn, no hiatal hernia, no abdominal pain, + constipation, no diarrhea, no hematochezia, no hematemesis, no melena  GU:   no dysuria,  no frequency, no urinary tract infection, no hematuria, no kidney stones, no kidney disease  Vascular:  no pain suggestive of claudication, no pain in feet, no leg cramps, no varicose veins, no DVT, no non-healing foot ulcer  Neuro:   no stroke, no TIA's, no seizures, no headaches, no temporary blindness one eye,  no slurred speech, no  peripheral neuropathy, no chronic pain, no instability of gait, no memory/cognitive dysfunction  Musculoskeletal: + arthritis primarily in hands, no joint swelling, no myalgias, no difficulty walking, normal mobility   Skin:   + h/o recurrent rash severe rash in the past presumably due to allergies but no clear source ever identified - can become quite severe and associated with dysphagia, no itching, no skin infections, no pressure sores or ulcerations  Psych:   no anxiety, no depression, no nervousness, no unusual recent stress  Eyes:   no blurry vision, no floaters, no recent vision changes,  wears glasses or contacts  ENT:   no hearing loss, no loose or painful teeth  Hematologic:  no easy bruising, no abnormal bleeding, no clotting disorder, no frequent epistaxis  Endocrine:  no diabetes, does not check CBG's at home     Physical Exam:   BP 113/54  Pulse 65  Temp(Src) 97.7 F (36.5 C) (Oral)  Resp 18  Ht 5' 3.5" (1.613 m)  Wt 73.483 kg (162 lb)  BMI 28.24 kg/m2  SpO2 96%  General:  Somewhat elderly but o/w  well-appearing  HEENT:  Unremarkable   Neck:   no JVD, no bruits, no adenopathy   Chest:   clear to auscultation, symmetrical breath sounds, no wheezes, no rhonchi   CV:   RRR, grade IV/VI crescendo/decrescendo systolic murmur   Abdomen:  soft, non-tender, no masses   Extremities:  warm, well-perfused, pulses diminished  Rectal/GU  Deferred  Neuro:   Grossly non-focal and symmetrical throughout  Skin:   Clean and dry, no rashes, no breakdown, thin and somewhat frail  Diagnostic Tests:   Transthoracic Echocardiography  Patient: [name], [name] MR #: [id number] Study Date: 07/18/2012 Gender: F Age: 46 Height: 160cm Weight: 73.5kg BSA: 1.65m^2 Pt. Status: Room:  ORDERING Nanetta Batty, MD REFERRING Nanetta Batty, MD ATTENDING Zoila Shutter [facility],  Iantha Fallen SONOGRAPHER Clearence Ped, RCS PERFORMING  [facility] cc:  ------------------------------------------------------------ LV EF: 65% - 70%  ------------------------------------------------------------ Indications: 424.1 Aortic valve disorders.  ------------------------------------------------------------ History: PMH: Jaw Pain Dyspnea and murmur.  ------------------------------------------------------------ Study Conclusions  - Aortic valve: Mildly to moderately calcified annulus. Probably trileaflet; normal thickness, moderately calcified leaflets. Moderate focal thickening and calcification involving the right coronary and noncoronary cusp. Cusp separation was reduced. Valve mobility was restricted. Transvalvular velocity was increased, due to stenosis. There was critical stenosis. Trivial regurgitation. Peak velocity: 505cm/s (S). Mean gradient: 63mm Hg (S). Peak gradient: Hg (S). Valve area: 0.55cm^2(VTI). Valve area: 0.48cm^2 (Vmax). - Left ventricle: The cavity size was normal. Wall thickness was increased in a pattern of moderate LVH. There was moderate concentric hypertrophy, with additional focal basal septal hypertrophy Systolic function was vigorous. The estimated ejection fraction was in the range of 65% to 70%. Wall motion was normal; there were no regional wall motion abnormalities. Doppler parameters are consistent with abnormal left ventricular relaxation (grade 1 diastolic dysfunction). Doppler parameters are consistent with elevated mean left atrial filling pressure. - Mitral valve: Moderately calcified, thickened annulus. Normal thickness leaflets . Mobility of the posterior leaflet was trivially restricted. Mild regurgitation. Valve area by pressure half-time: 2.22cm^2. Valve area by continuity equation (using LVOT flow): 2.36cm^2. - Left atrium: The atrium was mildly to moderately dilated. - Right ventricle: The cavity size was normal. Wall thickness was at the upper limits of normal. -  Atrial septum: No defect or patent foramen ovale was identified. - Pulmonary arteries: PA peak pressure: 34mm Hg (S). Impressions:  - Severe to Critical aortic stenosis - with peak transvalvular velocities of ~500 cm.sec, estimated Valve area of ~0.5 cm2 with peak gradients of ~100-105 mmHg and meangradients of 60-65 mmHg, consistent with an acquired process, with LA enlargement. Transthoracic echocardiography. M-mode, complete 2D, spectral Doppler, and color Doppler. Height: Height: 160cm. Height: 63in. Weight: Weight: 73.5kg. Weight: 161.7lb. Body mass index: BMI: 28.7kg/m^2. Body surface area: BSA: 1.29m^2. Blood pressure: 146/82. Patient status: Outpatient. Location: Echo laboratory.  ------------------------------------------------------------  ------------------------------------------------------------ Left ventricle: The cavity size was normal. Wall thickness was increased in a pattern of moderate LVH. There was moderate concentric hypertrophy, with additional focal basal septal hypertrophy Systolic function was vigorous. The estimated ejection fraction was in the range of 65% to 70%. Wall motion was normal; there were no regional wall motion abnormalities. Doppler parameters are consistent with abnormal left ventricular relaxation (grade 1 diastolic dysfunction). Doppler parameters are consistent with elevated mean left atrial filling pressure.  ------------------------------------------------------------ Aortic valve: Mildly to moderately calcified annulus. Probably trileaflet, but may be functionally bicuspid; mildly increased thickness, moderately calcified leaflets. Moderate focal thickening and calcification involving the right coronary and noncoronary cusp. Cusp separation was reduced. Valve mobility was restricted. Doppler: Transvalvular velocity was increased, due to stenosis. There was critical stenosis. Trivial regurgitation. VTI ratio of LVOT to aortic  valve: 0.22. Valve area: 0.55cm^2(VTI). Indexed valve area: 0.31cm^2/m^2 (VTI). Peak velocity ratio of LVOT to aortic valve: 0.19. Valve area: 0.48cm^2 (Vmax). Indexed valve area: 0.27cm^2/m^2 (Vmax). Mean gradient: 63mm Hg (S). Peak gradient: Hg (S).  ------------------------------------------------------------ Aorta: Aortic root: The aortic root was normal in size.  ------------------------------------------------------------ Mitral valve: Moderately calcified, thickened annulus. Normal thickness leaflets . Mobility of the posterior leaflet was trivially restricted. Doppler: Transvalvular velocity was within the normal range. There was no evidence for stenosis. Mild regurgitation. Valve area by pressure half-time: 2.22cm^2. Indexed valve area by pressure half-time: 1.25cm^2/m^2. Valve area by continuity equation (  using LVOT flow): 2.36cm^2. Indexed valve area by continuity equation (using LVOT flow): 1.33cm^2/m^2. Mean gradient: 3mm Hg (D).  ------------------------------------------------------------ Left atrium: LA Volume/ BSA = 31.6 ml/m2 The atrium was mildly to moderately dilated.  ------------------------------------------------------------ Atrial septum: Poorly visualized. The septum was normal. No defect or patent foramen ovale was identified.  ------------------------------------------------------------ Right ventricle: The cavity size was normal. Wall thickness was at the upper limits of normal. The moderator band was prominent. Systolic function was normal.  ------------------------------------------------------------ Pulmonic valve: Poorly visualized. The valve appears to be grossly normal. Doppler: Transvalvular velocity was within the normal range. There was no evidence for stenosis. Trivial regurgitation.  ------------------------------------------------------------ Tricuspid valve: Poorly visualized. The valve appears to be grossly normal. Doppler:  Transvalvular velocity was within the normal range. Mild regurgitation.  ------------------------------------------------------------ Pulmonary artery: Poorly visualized. The main pulmonary artery was normal-sized. Systolic pressure was within the normal range.  ------------------------------------------------------------ Right atrium: The atrium was normal in size.  ------------------------------------------------------------ Pericardium: There was no pericardial effusion.  ------------------------------------------------------------ Systemic veins: Inferior vena cava: The vessel was normal in size; the respirophasic diameter changes were in the normal range (= 50%); findings are consistent with normal central venous pressure. Diameter: 12mm.  ------------------------------------------------------------  2D measurements Normal Doppler measurements Normal IVC Main pulmonary Diam 12 mm ------ artery Left ventricle Pressure, 34 mm Hg =30 LVID ED, 36.28 mm 43-52 S chord, Left ventricle PLAX Ea, lat 4.9 cm/s ------ LVID ES, 33.1 mm 23-38 ann, tiss chord, DP PLAX E/Ea, lat 13.4 ------ FS, 9 % >29 ann, tiss 9 chord, DP PLAX Ea, med 4.5 cm/s ------ LVPW, ED 14.72 mm ------ ann, tiss IVS/LVPW 1.12 <1.3 DP ratio, ED E/Ea, med 14.6 ------ Ventricular septum ann, tiss 9 IVS, ED 16.53 mm ------ DP LVOT LVOT Diam, S 18 mm ------ Peak vel, 95.9 cm/s ------ Area 2.54 cm^2 ------ S Aorta VTI, S 28.2 cm ------ Root 29 mm ------ Aortic valve diam, ED Peak vel, 505 cm/s ------ Left atrium S AP dim 37 mm ------ Mean vel, 370 cm/s ------ AP dim 2.09 cm/m^2 <2.2 S index VTI, S 131 cm ------ Right ventricle Mean 63 mm Hg ------ RVID ED, 20.2 mm 19-38 gradient, PLAX S Peak 102 mm Hg ------ gradient, S VTI ratio 0.22 ------ LVOT/AV Area, VTI 0.55 cm^2 ------ Area index 0.31 cm^2/m ------ (VTI) ^2 Peak vel 0.19 ------ ratio, LVOT/AV Area, Vmax 0.48 cm^2 ------ Area index 0.27  cm^2/m ------ (Vmax) ^2 Regurg PHT 544 ms ------ Mitral valve Peak E vel 66.1 cm/s ------ Peak A vel 123 cm/s ------ Mean vel, 75.8 cm/s ------ D Decelerati 201 ms 150-23 on time 0 Pressure 99 ms ------ half-time Mean 3 mm Hg ------ gradient, D Peak E/A 0.5 ------ ratio Area (PHT) 2.22 cm^2 ------ Area index 1.25 cm^2/m ------ (PHT) ^2 Area 2.36 cm^2 ------ (LVOT) continuity Area index 1.33 cm^2/m ------ (LVOT ^2 cont) Annulus 30.3 cm ------ VTI Tricuspid valve Regurg 270 cm/s ------ peak vel Peak RV-RA 29 mm Hg ------ gradient, S Systemic veins Estimated 5 mm Hg ------ CVP Right ventricle Pressure, 34 mm Hg <30 S Sa vel, 14.1 cm/s ------ lat ann, tiss DP  ------------------------------------------------------------ Prepared and Electronically Authenticated by  Bryan Lemma 2014-05-20T14:03:[id number]         CARDIAC CATHETERIZATION   History obtained from chart review.The patient is a 79 year old mildly overweight married Caucasian female accompanied by her husband, who is also a patient of mine. She is a mother of 2, grandmother to 1 grandchild. I  saw her a year ago after they had celebrated their 60th wedding anniversary. Her risk factors include hypertension and family history. A brother died at age 67 from heart-related issues while he was being operated on. She has never had a heart attack or stroke. She does have moderate aortic stenosis by 2D echocardiogram last performed a year ago with a valve area of 0.83 cm2, peak gradient of 57, and mean of 35. She had negative Myoview on August 13, 2010. Since I saw her last, she developed exertional jaw pain, which was fairly reproducible. Her last lipid profile a year ago was excellent with total cholesterol 166, LDL 89, HDL 44. 2-D echo was performed showed critical aortic stenosis the valve area 0.5 cm. Based on this I believe the patient needs a right left heart catheter for all 4 aortic valve replacement with  a thoracic surgeon. I think she is acceptable risk for surgical valve replacement and probably doesn't meet criteria for TAVR.  PROCEDURE DESCRIPTION:  The patient was brought to the second floor [facility] cath lab in the postabsorptive state. She was premedicated with Valium 5 mg by mouth. Her right groin  was prepped and shaved in usual sterile fashion. Xylocaine 1% was used for local anesthesia. A 5 French sheath was inserted into the right common femoral  artery using standard Seldinger technique. A 7 French sheath was inserted into the right common femoral vein using standard Seldinger technique. A 7 French balloon tip thermal dilution Swan-Ganz catheter was advanced right heart chambers obtaining pressures and blood samples to obtain Fick and thermodilution cardiac outputs. 5 French right and left Judkins diagnostic catheters along with a 5 [place] AL-1 catheter were used for selective coronary angiography pertaining pullback pressures. Left ventriculography was not performed to conserve contrast and because a 2-D echo already been done. Visipaque dye was used for the entirety of the case (30 cc administered to the patient), retrograde aortic, left ventricular end pullback pressures were recorded.  HEMODYNAMICS:  AO SYSTOLIC/AO DIASTOLIC: 168/69  LV SYSTOLIC/LV DIASTOLIC: 231/20 (end-diastolic pressure 30)  Right atrial pressure: 14/12  Right ventricular pressure: 34/13  Pulmonary artery pressure: 34/16, mean 27  Pulmonary capillary wedge pressure: 22/21, mean 18  Cardiac output 4.1 L per minute (Fick), cardiac index 2.3 L per minute per meter squared  Aortic gradient : 63 mmHg peak to peak  Aortic valve area: 0.37 cm   ANGIOGRAPHIC RESULTS:  1. Left main; normal  2. LAD; normal  3. Left circumflex; normal.  4. Right coronary artery; dominant and normal  5. Left ventriculography; not performed today  IMPRESSION:[name] has normal coronary arteries and critical aortic  stenosis. Her valve area is 0.37 cm. I believe she is a good candidate for aortic valve replacement with a bioprosthesis. The sheath were removed and pressure was held on the groin to achieve hemostasis. The patient left the Cath Lab in stable condition.[facility] has been notified for surgical consultation. The patient will remain in the hospital overnight for observation and will be discharged home in the morning.  Runell Gess MD, [employer]  08/14/2012  9:43 AM       Impression:  The patient has severe symptomatic aortic stenosis with normal left ventricular systolic function. Risks associated with surgery will be slightly elevated because of the patient's advanced age, but overall she remains well preserved for her age, functionally active, and physically independent. I agree that she would best be treated with conventional surgical aortic valve replacemen  Plan:  The patient was counseled at length regarding surgical alternatives with respect to valve replacement including continued medical therapy versus proceeding with conventional surgical aortic valve replacement using either a mechanical prosthesis or a bioprosthetic tissue valve.  Other alternatives including transcatheter aortic valve replacement were discussed.  Discussion was held comparing the relative risks of mechanical valve replacement with need for lifelong anticoagulation versus use of a bioprosthetic tissue valve and the associated potential for late structural valve deterioration in failure.  This discussion was placed in the context of the patient's particular circumstances, and as a result the patient specifically requests that their valve be replaced using a bioprosthetic tissue valve .  They understand and accept all potential associated risks of surgery including but not limited to risk of death, stroke, myocardial infarction, congestive heart failure, respiratory failure, renal failure, pneumonia, bleeding requiring blood  transfusion and or reexploration, arrhythmia, heart block or bradycardia requiring permanent pacemaker, pleural effusions or other delayed complications related to continued congestive heart failure, or other late complications related to valve replacement.  Tentatively plan to proceed with surgery on Wednesday, 08/30/2012. The patient will return for followup on Monday, June 30 prior to surgery.      Salvatore Decent. Cornelius Moras, MD 08/14/2012 5:47 PM  I spent in excess of 90 minutes of time directly involved in the conduct of this consultation.

## 2012-08-15 ENCOUNTER — Other Ambulatory Visit: Payer: Self-pay | Admitting: *Deleted

## 2012-08-15 ENCOUNTER — Encounter (HOSPITAL_COMMUNITY): Payer: Self-pay | Admitting: Cardiology

## 2012-08-15 ENCOUNTER — Ambulatory Visit (HOSPITAL_COMMUNITY): Payer: Medicare Other

## 2012-08-15 DIAGNOSIS — R0989 Other specified symptoms and signs involving the circulatory and respiratory systems: Secondary | ICD-10-CM | POA: Diagnosis not present

## 2012-08-15 DIAGNOSIS — I359 Nonrheumatic aortic valve disorder, unspecified: Secondary | ICD-10-CM

## 2012-08-15 DIAGNOSIS — R0609 Other forms of dyspnea: Secondary | ICD-10-CM | POA: Diagnosis not present

## 2012-08-15 DIAGNOSIS — R079 Chest pain, unspecified: Secondary | ICD-10-CM | POA: Diagnosis not present

## 2012-08-15 DIAGNOSIS — IMO0001 Reserved for inherently not codable concepts without codable children: Secondary | ICD-10-CM

## 2012-08-15 DIAGNOSIS — Z0389 Encounter for observation for other suspected diseases and conditions ruled out: Secondary | ICD-10-CM

## 2012-08-15 DIAGNOSIS — J449 Chronic obstructive pulmonary disease, unspecified: Secondary | ICD-10-CM | POA: Diagnosis present

## 2012-08-15 DIAGNOSIS — R0600 Dyspnea, unspecified: Secondary | ICD-10-CM | POA: Diagnosis present

## 2012-08-15 DIAGNOSIS — Z01818 Encounter for other preprocedural examination: Secondary | ICD-10-CM | POA: Diagnosis not present

## 2012-08-15 DIAGNOSIS — R6884 Jaw pain: Secondary | ICD-10-CM | POA: Diagnosis not present

## 2012-08-15 DIAGNOSIS — Z01810 Encounter for preprocedural cardiovascular examination: Secondary | ICD-10-CM

## 2012-08-15 MED ORDER — ALBUTEROL SULFATE (5 MG/ML) 0.5% IN NEBU
2.5000 mg | INHALATION_SOLUTION | Freq: Once | RESPIRATORY_TRACT | Status: AC
  Administered 2012-08-15: 08:00:00 2.5 mg via RESPIRATORY_TRACT

## 2012-08-15 NOTE — Progress Notes (Signed)
Subjective:  No complaints  Objective:  Vital Signs in the last 24 hours: Temp:  [97.5 F (36.4 C)-98.2 F (36.8 C)] 98 F (36.7 C) (06/17 0839) Pulse Rate:  [62-75] 75 (06/17 0839) Resp:  [16-18] 18 (06/17 0839) BP: (109-152)/(48-64) 123/63 mmHg (06/17 0839) SpO2:  [95 %-98 %] 98 % (06/17 0839) Weight:  [79.6 kg (175 lb 7.8 oz)] 79.6 kg (175 lb 7.8 oz) (06/17 0459)  Intake/Output from previous day:  Intake/Output Summary (Last 24 hours) at 08/15/12 1001 Last data filed at 08/15/12 0647  Gross per 24 hour  Intake  442.5 ml  Output   1600 ml  Net -1157.5 ml    Physical Exam: General appearance: alert, cooperative and no distress Lungs: clear to auscultation bilaterally Heart: regular rate and rhythm and 2/6 systolic murmur Extrem: No hematoma Rt groin   Rate: 74  Rhythm: normal sinus rhythm  Lab Results:   Imaging: Imaging results have been reviewed- COPD  Cardiac Studies:  Assessment/Plan:   Principal Problem:   Aortic stenosis- critical AS at cath 08/14/12 Active Problems:   Dyspnea-exertional   Chest pain-exertional   Hx Left Breast cancer, DCIS   COPD (chronic obstructive pulmonary disease)- noted on CXR 08/08/12   HTN (hypertension)   GERD (gastroesophageal reflux disease)   Normal coronary arteries-6/14    PLAN:  Follow up with Dr Cornelius Moras for AVR 08/30/12.  Corine Shelter PA-C Beeper [phone] 08/15/2012, 10:01 AM   Agree with note written by Corine Shelter PAC  S/P R/L heart cath for Critical AS. For AVR with Dr. Cornelius Moras. Exam benign. Labs OK. D/C home today.   Runell Gess 08/15/2012 10:15 AM

## 2012-08-15 NOTE — Discharge Summary (Signed)
Patient ID: [name],  MRN: [id number], DOB/AGE: February 28, 1932 79 y.o.  Admit date: 08/14/2012 Discharge date: 08/15/2012  Primary Care Provider: Dr Baltazar Najjar Primary Cardiologist: Dr Allyson Sabal  Discharge Diagnoses Principal Problem:   Aortic stenosis- critical AS at cath 08/14/12 Active Problems:   Dyspnea-exertional   Chest pain-exertional   Hx Left Breast cancer, DCIS   COPD (chronic obstructive pulmonary disease)- noted on CXR 08/08/12   HTN (hypertension)   GERD (gastroesophageal reflux disease)   Normal coronary arteries-6/14    Procedures:  Coronary angiogram 08/14/12   Hospital Course: Pleasant 79 y/o female followed by Dr Allyson Sabal with AS. She has recently developed exertional chest pain and SOB. OP echo suggested progression of her AS to severe, see Dr Hazle Coca office note from 07/20/12 for details. She was admitted 08/14/12 for angiogram which revealed normal coronaries and critical AS. She was seen in consult by Dr Cornelius Moras. The pt will be set up for AVR 08/30/12. She can be discharged 08/15/12.  Discharge Vitals:  Blood pressure 123/63, pulse 75, temperature 98 F (36.7 C), temperature source Oral, resp. rate 18, height 5' 3.5" (1.613 m), weight 79.6 kg (175 lb 7.8 oz), SpO2 98.00%.    Labs: Results for orders placed during the hospital encounter of 08/14/12 (from the past 48 hour(s))  POCT I-STAT 3, BLOOD GAS (G3P V)     Status: Abnormal   Collection Time    08/14/12  9:19 AM      Result Value Range   pH, Ven 7.369 (*) 7.250 - 7.300   pCO2, Ven 45.4  45.0 - 50.0 mmHg   pO2, Ven 32.0  30.0 - 45.0 mmHg   Bicarbonate 26.2 (*) 20.0 - 24.0 mEq/L   TCO2 28  0 - 100 mmol/L   O2 Saturation 59.0     Acid-Base Excess 1.0  0.0 - 2.0 mmol/L   Sample type MIXED VENOUS SAMPLE     Comment NOTIFIED PHYSICIAN    POCT I-STAT 3, BLOOD GAS (G3+)     Status: Abnormal   Collection Time    08/14/12  9:20 AM      Result Value Range   pH, Arterial 7.443  7.350 - 7.450   pCO2 arterial 38.9   35.0 - 45.0 mmHg   pO2, Arterial 71.0 (*) 80.0 - 100.0 mmHg   Bicarbonate 26.6 (*) 20.0 - 24.0 mEq/L   TCO2 28  0 - 100 mmol/L   O2 Saturation 95.0     Acid-Base Excess 2.0  0.0 - 2.0 mmol/L   Sample type ARTERIAL      Disposition:  Follow-up Information   Follow up with Purcell Nails, MD. (Keep appointment for surgery)    Contact information:   [address] [employer] [address] [place] [address] [phone]       Discharge Medications:    Medication List    TAKE these medications       acetaminophen 500 MG tablet  Commonly known as:  TYLENOL  Take 500 mg by mouth as needed for pain.     Biotin 5000 MCG Caps  Take 1 capsule by mouth daily.     CITRACAL + D PO  Take 1 tablet by mouth 2 (two) times daily.     fexofenadine 180 MG tablet  Commonly known as:  ALLEGRA  Take 180 mg by mouth daily as needed (for allergies).     fish oil-omega-3 fatty acids 1000 MG capsule  Take 1 g by mouth daily.  hydrochlorothiazide 25 MG tablet  Commonly known as:  HYDRODIURIL  Take 25 mg by mouth daily.     hydrOXYzine 25 MG tablet  Commonly known as:  ATARAX/VISTARIL  Take 25 mg by mouth as needed for itching.     metoprolol succinate 25 MG 24 hr tablet  Commonly known as:  TOPROL-XL  Take 0.5 tablets (12.5 mg total) by mouth daily.     omeprazole 20 MG capsule  Commonly known as:  PRILOSEC  Take 40 mg by mouth daily.     PHILLIPS MILK OF MAGNESIA PO  Take 15 mLs by mouth at bedtime.     TOVIAZ 8 MG Tb24  Generic drug:  fesoterodine  Take 8 mg by mouth daily.     triamcinolone 55 MCG/ACT nasal inhaler  Commonly known as:  NASACORT  Place 2 sprays into the nose daily as needed (for allergies).     vitamin C 500 MG tablet  Commonly known as:  ASCORBIC ACID  Take 500 mg by mouth daily.     vitamin E 400 UNIT capsule  Take 400 Units by mouth daily.     zoledronic acid 5 MG/100ML Soln  Commonly known as:  RECLAST  Inject 5 mg into the vein once.      zolpidem 10 MG tablet  Commonly known as:  AMBIEN  Take 10 mg by mouth at bedtime as needed for sleep.         Duration of Discharge Encounter: Greater than 30 minutes including physician time.  Jolene Provost PA-C 08/15/2012 10:29 AM

## 2012-08-15 NOTE — Progress Notes (Signed)
VASCULAR LAB PRELIMINARY  PRELIMINARY  PRELIMINARY  PRELIMINARY  Pre-op Cardiac Surgery  Carotid Findings:  Bilateral 0% to 49% ICA stenosis. Exam performed at [facility] 07/18/2012  Upper Extremity Right Left  Brachial Pressures 131 Triphasic 119 Triphasic  Radial Waveforms Triphasic Triphasic  Ulnar Waveforms Triphasic Triphasic  Palmar Arch (Allen's Test) Normal Abnormal   Findings:  Doppler waveforms remained normal with both radial and ulnar compressions on the right. Left Doppler waveforms remained normal with radial compression and reversed with ulnar compression.   Kathlee Barnhardt, RVS 08/15/2012, 10:39 AM

## 2012-08-21 ENCOUNTER — Encounter (HOSPITAL_COMMUNITY): Payer: Self-pay | Admitting: Respiratory Therapy

## 2012-08-25 ENCOUNTER — Ambulatory Visit (INDEPENDENT_AMBULATORY_CARE_PROVIDER_SITE_OTHER): Payer: Medicare Other | Admitting: Cardiovascular Disease

## 2012-08-25 ENCOUNTER — Encounter: Payer: Self-pay | Admitting: Cardiovascular Disease

## 2012-08-25 VITALS — BP 142/74 | HR 72 | Ht 63.0 in | Wt 160.0 lb

## 2012-08-25 DIAGNOSIS — I359 Nonrheumatic aortic valve disorder, unspecified: Secondary | ICD-10-CM | POA: Diagnosis not present

## 2012-08-25 DIAGNOSIS — I350 Nonrheumatic aortic (valve) stenosis: Secondary | ICD-10-CM

## 2012-08-25 NOTE — Patient Instructions (Addendum)
Dr Allyson Sabal will see you after your heart surgery.

## 2012-08-25 NOTE — Progress Notes (Signed)
08/25/2012 [name]   1932-01-31  [id number]  Primary Physician Bonnie Sleeper, MD Primary Cardiologist: Bonnie Gess MD [name]   HPI:  The patient is a 79 year old mildly overweight married Caucasian female accompanied by her husband, who is also a patient of mine. She is a mother of 2, grandmother to 1 grandchild. I saw her a year ago after they had celebrated their 60th wedding anniversary. Her risk factors include hypertension and family history. A brother died at age 14 from heart-related issues while he was being operated on. She has never had a heart attack or stroke. She does have moderate aortic stenosis by 2D echocardiogram last performed a year ago with a valve area of 0.83 cm2, peak gradient of 57, and mean of 35. She had negative Myoview on August 13, 2010. Since I saw her last, she developed exertional jaw pain, which was fairly reproducible. Her last lipid profile a year ago was excellent with total cholesterol 166, LDL 89, HDL 44. 2-D echo was performed showed critical aortic stenosis the valve area 0.5 cm. Based on this I believe the patient needs a right left heart catheter for all 4 aortic valve replacement with a thoracic surgeon. I think she is acceptable risk for surgical valve replacement and probably doesn't meet criteria for TAVR. I performed right and left heart cath on her regular normal coronaries and critical aortic stenosis. She saw [name] in the office subsequent to that and has scheduled her for aortic valve replacement on July 2 second using a bioprosthesis.recent carotid Dopplers and off was performed in May showed no evidence of internal carotid artery stenosis.    Current Outpatient Prescriptions  Medication Sig Dispense Refill  . acetaminophen (TYLENOL) 500 MG tablet Take 500 mg by mouth as needed for pain.      . Biotin 5000 MCG CAPS Take 1 capsule by mouth daily.      . Calcium Citrate-Vitamin D (CITRACAL + D PO) Take 1  tablet by mouth 2 (two) times daily.       . fesoterodine (TOVIAZ) 8 MG TB24 Take 8 mg by mouth daily.      . fexofenadine (ALLEGRA) 180 MG tablet Take 180 mg by mouth daily as needed (for allergies).      . fish oil-omega-3 fatty acids 1000 MG capsule Take 1 g by mouth daily.       . hydrochlorothiazide 25 MG tablet Take 25 mg by mouth daily.        . hydrOXYzine (ATARAX/VISTARIL) 25 MG tablet Take 25 mg by mouth as needed for itching.      . Magnesium Hydroxide (PHILLIPS MILK OF MAGNESIA PO) Take 15 mLs by mouth at bedtime.       . metoprolol succinate (TOPROL-XL) 25 MG 24 hr tablet Take 0.5 tablets (12.5 mg total) by mouth daily.  30 tablet  6  . omeprazole (PRILOSEC) 20 MG capsule Take 40 mg by mouth daily.       . shark liver oil-cocoa butter (PREPARATION H) 0.25-3-85.5 % suppository Place 1 suppository rectally as needed for hemorrhoids.      . triamcinolone (NASACORT) 55 MCG/ACT nasal inhaler Place 2 sprays into the nose daily as needed (for allergies).      . vitamin C (ASCORBIC ACID) 500 MG tablet Take 500 mg by mouth daily.      . vitamin E 400 UNIT capsule Take 400 Units by mouth daily.      Marland Kitchen zolpidem (AMBIEN) 10  MG tablet Take 10 mg by mouth at bedtime as needed for sleep.       No current facility-administered medications for this visit.    Allergies  Allergen Reactions  . Codeine Rash    All over the body.    History   Social History  . Marital Status: Married    Spouse Name: N/A    Number of Children: N/A  . Years of Education: N/A   Occupational History  . Not on file.   Social History Main Topics  . Smoking status: Never Smoker   . Smokeless tobacco: Never Used  . Alcohol Use: No  . Drug Use: No  . Sexually Active: Not on file   Other Topics Concern  . Not on file   Social History Narrative  . No narrative on file     Review of Systems: General: negative for chills, fever, night sweats or weight changes.  Cardiovascular: negative for chest pain,  dyspnea on exertion, edema, orthopnea, palpitations, paroxysmal nocturnal dyspnea or shortness of breath Dermatological: negative for rash Respiratory: negative for cough or wheezing Urologic: negative for hematuria Abdominal: negative for nausea, vomiting, diarrhea, bright red blood per rectum, melena, or hematemesis Neurologic: negative for visual changes, syncope, or dizziness All other systems reviewed and are otherwise negative except as noted above.    Blood pressure 142/74, pulse 72, height 5\' 3"  (1.6 m), weight 160 lb (72.576 kg).  General appearance: alert and no distress Neck: no adenopathy, no JVD, supple, symmetrical, trachea midline, thyroid not enlarged, symmetric, no tenderness/mass/nodules and bilateral carotid bruits versus a transmitted murmur Lungs: clear to auscultation bilaterally Heart: typical murmur of aortic stenosis Extremities: extremities normal, atraumatic, no cyanosis or edema and right femoral artery puncture site was well-healed  EKG not performed today  ASSESSMENT AND PLAN:   Aortic stenosis- critical AS at cath 08/14/12 Patient underwent right and left heart cath by myself recently revealing normal coronary arteries with critical aortic stenosis. She saw [name] in  consultation who has scheduled her for aortic valve replacement using a bioprosthesis on July 2.      Bonnie Gess MD FACP,FACC,FAHA, [facility] 08/25/2012 12:26 PM

## 2012-08-25 NOTE — Assessment & Plan Note (Signed)
Patient underwent right and left heart cath by myself recently revealing normal coronary arteries with critical aortic stenosis. She saw Dr. Ashley Mariner in  consultation who has scheduled her for aortic valve replacement using a bioprosthesis on July 2.

## 2012-08-27 ENCOUNTER — Other Ambulatory Visit (HOSPITAL_BASED_OUTPATIENT_CLINIC_OR_DEPARTMENT_OTHER): Payer: Self-pay | Admitting: Internal Medicine

## 2012-08-28 ENCOUNTER — Encounter (HOSPITAL_COMMUNITY): Payer: Self-pay

## 2012-08-28 ENCOUNTER — Encounter (HOSPITAL_COMMUNITY)
Admission: RE | Admit: 2012-08-28 | Discharge: 2012-08-28 | Disposition: A | Discharge status: Home / Self Care | Payer: Medicare Other | Source: Ambulatory Visit | Attending: Thoracic Surgery (Cardiothoracic Vascular Surgery) | Admitting: Thoracic Surgery (Cardiothoracic Vascular Surgery)

## 2012-08-28 ENCOUNTER — Ambulatory Visit (INDEPENDENT_AMBULATORY_CARE_PROVIDER_SITE_OTHER): Payer: Medicare Other | Admitting: Thoracic Surgery (Cardiothoracic Vascular Surgery)

## 2012-08-28 ENCOUNTER — Encounter (HOSPITAL_COMMUNITY)
Admit: 2012-08-28 | Discharge: 2012-08-28 | Disposition: A | Discharge status: Home / Self Care | Payer: Medicare Other | Attending: Thoracic Surgery (Cardiothoracic Vascular Surgery) | Admitting: Thoracic Surgery (Cardiothoracic Vascular Surgery)

## 2012-08-28 ENCOUNTER — Encounter: Payer: Self-pay | Admitting: Thoracic Surgery (Cardiothoracic Vascular Surgery)

## 2012-08-28 VITALS — BP 116/73 | HR 64 | Temp 98.1°F | Resp 20 | Ht 63.0 in | Wt 161.1 lb

## 2012-08-28 VITALS — BP 139/72 | HR 65 | Resp 20 | Ht 63.0 in | Wt 161.0 lb

## 2012-08-28 DIAGNOSIS — J90 Pleural effusion, not elsewhere classified: Secondary | ICD-10-CM | POA: Diagnosis not present

## 2012-08-28 DIAGNOSIS — R0609 Other forms of dyspnea: Secondary | ICD-10-CM | POA: Diagnosis not present

## 2012-08-28 DIAGNOSIS — R0989 Other specified symptoms and signs involving the circulatory and respiratory systems: Secondary | ICD-10-CM | POA: Diagnosis not present

## 2012-08-28 DIAGNOSIS — N318 Other neuromuscular dysfunction of bladder: Secondary | ICD-10-CM | POA: Diagnosis not present

## 2012-08-28 DIAGNOSIS — E876 Hypokalemia: Secondary | ICD-10-CM | POA: Diagnosis not present

## 2012-08-28 DIAGNOSIS — R0602 Shortness of breath: Secondary | ICD-10-CM | POA: Diagnosis not present

## 2012-08-28 DIAGNOSIS — J449 Chronic obstructive pulmonary disease, unspecified: Secondary | ICD-10-CM | POA: Diagnosis not present

## 2012-08-28 DIAGNOSIS — D696 Thrombocytopenia, unspecified: Secondary | ICD-10-CM | POA: Diagnosis not present

## 2012-08-28 DIAGNOSIS — I739 Peripheral vascular disease, unspecified: Secondary | ICD-10-CM | POA: Diagnosis not present

## 2012-08-28 DIAGNOSIS — I359 Nonrheumatic aortic valve disorder, unspecified: Secondary | ICD-10-CM

## 2012-08-28 DIAGNOSIS — J4489 Other specified chronic obstructive pulmonary disease: Secondary | ICD-10-CM | POA: Diagnosis not present

## 2012-08-28 DIAGNOSIS — Z01812 Encounter for preprocedural laboratory examination: Secondary | ICD-10-CM | POA: Diagnosis not present

## 2012-08-28 DIAGNOSIS — I959 Hypotension, unspecified: Secondary | ICD-10-CM | POA: Diagnosis not present

## 2012-08-28 DIAGNOSIS — R269 Unspecified abnormalities of gait and mobility: Secondary | ICD-10-CM | POA: Diagnosis not present

## 2012-08-28 DIAGNOSIS — Z01818 Encounter for other preprocedural examination: Secondary | ICD-10-CM | POA: Diagnosis not present

## 2012-08-28 DIAGNOSIS — M129 Arthropathy, unspecified: Secondary | ICD-10-CM | POA: Diagnosis present

## 2012-08-28 DIAGNOSIS — R079 Chest pain, unspecified: Secondary | ICD-10-CM | POA: Diagnosis not present

## 2012-08-28 DIAGNOSIS — R21 Rash and other nonspecific skin eruption: Secondary | ICD-10-CM | POA: Diagnosis not present

## 2012-08-28 DIAGNOSIS — I10 Essential (primary) hypertension: Secondary | ICD-10-CM | POA: Diagnosis not present

## 2012-08-28 DIAGNOSIS — R5381 Other malaise: Secondary | ICD-10-CM | POA: Diagnosis not present

## 2012-08-28 DIAGNOSIS — Z01810 Encounter for preprocedural cardiovascular examination: Secondary | ICD-10-CM | POA: Diagnosis not present

## 2012-08-28 DIAGNOSIS — D62 Acute posthemorrhagic anemia: Secondary | ICD-10-CM | POA: Diagnosis not present

## 2012-08-28 DIAGNOSIS — Z4889 Encounter for other specified surgical aftercare: Secondary | ICD-10-CM | POA: Diagnosis not present

## 2012-08-28 DIAGNOSIS — Z853 Personal history of malignant neoplasm of breast: Secondary | ICD-10-CM | POA: Diagnosis not present

## 2012-08-28 DIAGNOSIS — Z0389 Encounter for observation for other suspected diseases and conditions ruled out: Secondary | ICD-10-CM | POA: Diagnosis not present

## 2012-08-28 DIAGNOSIS — M6281 Muscle weakness (generalized): Secondary | ICD-10-CM | POA: Diagnosis not present

## 2012-08-28 DIAGNOSIS — J9819 Other pulmonary collapse: Secondary | ICD-10-CM | POA: Diagnosis not present

## 2012-08-28 DIAGNOSIS — J984 Other disorders of lung: Secondary | ICD-10-CM | POA: Diagnosis not present

## 2012-08-28 DIAGNOSIS — K219 Gastro-esophageal reflux disease without esophagitis: Secondary | ICD-10-CM | POA: Diagnosis not present

## 2012-08-28 DIAGNOSIS — Z79899 Other long term (current) drug therapy: Secondary | ICD-10-CM | POA: Diagnosis not present

## 2012-08-28 DIAGNOSIS — I4891 Unspecified atrial fibrillation: Secondary | ICD-10-CM | POA: Diagnosis not present

## 2012-08-28 DIAGNOSIS — Z96649 Presence of unspecified artificial hip joint: Secondary | ICD-10-CM | POA: Diagnosis not present

## 2012-08-28 DIAGNOSIS — I350 Nonrheumatic aortic (valve) stenosis: Secondary | ICD-10-CM

## 2012-08-28 DIAGNOSIS — H9190 Unspecified hearing loss, unspecified ear: Secondary | ICD-10-CM | POA: Diagnosis present

## 2012-08-28 DIAGNOSIS — J811 Chronic pulmonary edema: Secondary | ICD-10-CM | POA: Diagnosis not present

## 2012-08-28 DIAGNOSIS — M810 Age-related osteoporosis without current pathological fracture: Secondary | ICD-10-CM | POA: Diagnosis present

## 2012-08-28 DIAGNOSIS — Z952 Presence of prosthetic heart valve: Secondary | ICD-10-CM | POA: Diagnosis not present

## 2012-08-28 DIAGNOSIS — E785 Hyperlipidemia, unspecified: Secondary | ICD-10-CM | POA: Diagnosis present

## 2012-08-28 DIAGNOSIS — E8779 Other fluid overload: Secondary | ICD-10-CM | POA: Diagnosis not present

## 2012-08-28 DIAGNOSIS — K5900 Constipation, unspecified: Secondary | ICD-10-CM | POA: Diagnosis not present

## 2012-08-28 LAB — CBC
HCT: 34.6 % — ABNORMAL LOW (ref 36.0–46.0)
Hemoglobin: 12 g/dL (ref 12.0–15.0)
MCH: 31.2 pg (ref 26.0–34.0)
MCHC: 34.7 g/dL (ref 30.0–36.0)
MCV: 89.9 fL (ref 78.0–100.0)
Platelets: 217 10*3/uL (ref 150–400)
RBC: 3.85 MIL/uL — ABNORMAL LOW (ref 3.87–5.11)
RDW: 12.7 % (ref 11.5–15.5)
WBC: 7.7 10*3/uL (ref 4.0–10.5)

## 2012-08-28 LAB — BLOOD GAS, ARTERIAL
Acid-Base Excess: 3.2 mmol/L — ABNORMAL HIGH (ref 0.0–2.0)
Bicarbonate: 26.8 mEq/L — ABNORMAL HIGH (ref 20.0–24.0)
Drawn by: 344381
FIO2: 0.21 %
O2 Saturation: 99.7 %
Patient temperature: 98.6
TCO2: 28 mmol/L (ref 0–100)
pCO2 arterial: 38.3 mmHg (ref 35.0–45.0)
pH, Arterial: 7.459 — ABNORMAL HIGH (ref 7.350–7.450)
pO2, Arterial: 112 mmHg — ABNORMAL HIGH (ref 80.0–100.0)

## 2012-08-28 LAB — URINE MICROSCOPIC-ADD ON

## 2012-08-28 LAB — COMPREHENSIVE METABOLIC PANEL
ALT: 10 U/L (ref 0–35)
AST: 15 U/L (ref 0–37)
Albumin: 4 g/dL (ref 3.5–5.2)
Alkaline Phosphatase: 63 U/L (ref 39–117)
BUN: 11 mg/dL (ref 6–23)
CO2: 23 mEq/L (ref 19–32)
Calcium: 9.8 mg/dL (ref 8.4–10.5)
Chloride: 98 mEq/L (ref 96–112)
Creatinine, Ser: 0.84 mg/dL (ref 0.50–1.10)
GFR calc Af Amer: 75 mL/min — ABNORMAL LOW (ref >90)
GFR calc non Af Amer: 64 mL/min — ABNORMAL LOW (ref >90)
Glucose, Bld: 92 mg/dL (ref 70–99)
Potassium: 3.9 mEq/L (ref 3.5–5.1)
Sodium: 135 mEq/L (ref 135–145)
Total Bilirubin: 0.3 mg/dL (ref 0.3–1.2)
Total Protein: 6.9 g/dL (ref 6.0–8.3)

## 2012-08-28 LAB — SURGICAL PCR SCREEN
MRSA, PCR: POSITIVE — AB
Staphylococcus aureus: POSITIVE — AB

## 2012-08-28 LAB — URINALYSIS, ROUTINE W REFLEX MICROSCOPIC
Bilirubin Urine: NEGATIVE
Glucose, UA: NEGATIVE mg/dL
Hgb urine dipstick: NEGATIVE
Ketones, ur: NEGATIVE mg/dL
Nitrite: NEGATIVE
Protein, ur: NEGATIVE mg/dL
Specific Gravity, Urine: 1.01 (ref 1.005–1.030)
Urobilinogen, UA: 0.2 mg/dL (ref 0.0–1.0)
pH: 7.5 (ref 5.0–8.0)

## 2012-08-28 LAB — APTT: aPTT: 36 seconds (ref 24–37)

## 2012-08-28 LAB — PROTIME-INR
INR: 0.95 (ref 0.00–1.49)
Prothrombin Time: 12.5 seconds (ref 11.6–15.2)

## 2012-08-28 LAB — ABO/RH: ABO/RH(D): O NEG

## 2012-08-28 NOTE — Progress Notes (Signed)
      [address]       [name] [address]             [phone]     CARDIOTHORACIC SURGERY OFFICE NOTE  Referring Provider is Runell Gess, MD PCP is Oneal Grout, MD   HPI:  Patient returns for followup of severe symptomatic aortic stenosis with tentative plans to proceed with elective aortic valve replacement later this week. He reports no new problems or complaints.   Current Outpatient Prescriptions  Medication Sig Dispense Refill  . acetaminophen (TYLENOL) 500 MG tablet Take 500 mg by mouth as needed for pain.      . Biotin 5000 MCG CAPS Take 1 capsule by mouth daily.      . Calcium Citrate-Vitamin D (CITRACAL + D PO) Take 1 tablet by mouth 2 (two) times daily.       . fesoterodine (TOVIAZ) 8 MG TB24 Take 8 mg by mouth daily.      . fexofenadine (ALLEGRA) 180 MG tablet Take 180 mg by mouth daily as needed (for allergies).      . hydrochlorothiazide 25 MG tablet Take 25 mg by mouth daily.        . hydrOXYzine (ATARAX/VISTARIL) 25 MG tablet Take 25 mg by mouth as needed for itching.      . Magnesium Hydroxide (PHILLIPS MILK OF MAGNESIA PO) Take 15 mLs by mouth at bedtime.       . metoprolol succinate (TOPROL-XL) 25 MG 24 hr tablet Take 0.5 tablets (12.5 mg total) by mouth daily.  30 tablet  6  . omeprazole (PRILOSEC) 20 MG capsule TAKE 2 CAPSULES BY MOUTH DAILY FOR REFLUX  60 capsule  5  . shark liver oil-cocoa butter (PREPARATION H) 0.25-3-85.5 % suppository Place 1 suppository rectally as needed for hemorrhoids.      . triamcinolone (NASACORT) 55 MCG/ACT nasal inhaler Place 2 sprays into the nose daily as needed (for allergies).      . vitamin C (ASCORBIC ACID) 500 MG tablet Take 500 mg by mouth daily.      . vitamin E 400 UNIT capsule Take 400 Units by mouth daily.      Marland Kitchen zolpidem (AMBIEN) 10 MG tablet Take 10 mg by mouth at bedtime as needed for sleep.       No current facility-administered medications for this visit.      Physical  Exam:   BP 139/72  Pulse 65  Resp 20  Ht 5\' 3"  (1.6 m)  Wt 161 lb (73.029 kg)  BMI 28.53 kg/m2  SpO2 98%  General:  Elderly but well-appearing  Chest:   Clear to auscultation  CV:   Regular rate and rhythm with IV/VI systolic murmur  Incisions:  n/a  Abdomen:  Soft and nontender  Extremities:  Warm and well-perfused  Diagnostic Tests:  n/a   Impression:  Severe symptomatic aortic stenosis with preserved left ventricular systolic function.  Plan:  I spent in excess of 40 minutes reviewing the indications, risks, and potential benefits of surgery with the patient and her family. All of her questions have been answered. We plan to proceed with surgery on Wednesday as previously scheduled.   Salvatore Decent. Cornelius Moras, MD 08/28/2012 5:08 PM

## 2012-08-28 NOTE — Pre-Procedure Instructions (Signed)
[name]  08/28/2012   Your procedure is scheduled on:  08/30/12  Report to [facility] at 630 AM.  Call this number if you have problems the morning of surgery: [phone]   Remember:   Do not eat food or drink liquids after midnight.   Take these medicines the morning of surgery with A SIP OF WATER: metoprolol,prilosec   Do not wear jewelry, make-up or nail polish.  Do not wear lotions, powders, or perfumes. You may wear deodorant.  Do not shave 48 hours prior to surgery. Men may shave face and neck.  Do not bring valuables to the hospital.  [facility] is not responsible                   for any belongings or valuables.  Contacts, dentures or bridgework may not be worn into surgery.  Leave suitcase in the car. After surgery it may be brought to your room.  For patients admitted to the hospital, checkout time is 11:00 AM the day of  discharge.   Patients discharged the day of surgery will not be allowed to drive  home.  Name and phone number of your driver: family  Special Instructions: Incentive Spirometry - Practice and bring it with you on the day of surgery.   Please read over the following fact sheets that you were given: Pain Booklet, Coughing and Deep Breathing, Blood Transfusion Information, MRSA Information and Surgical Site Infection Prevention

## 2012-08-29 ENCOUNTER — Encounter (HOSPITAL_COMMUNITY): Payer: Self-pay | Admitting: Anesthesiology

## 2012-08-29 ENCOUNTER — Other Ambulatory Visit: Payer: Self-pay | Admitting: *Deleted

## 2012-08-29 DIAGNOSIS — Z22321 Carrier or suspected carrier of Methicillin susceptible Staphylococcus aureus: Secondary | ICD-10-CM

## 2012-08-29 LAB — HEMOGLOBIN A1C
Hgb A1c MFr Bld: 5.5 % (ref <5.7)
Mean Plasma Glucose: 111 mg/dL (ref <117)

## 2012-08-29 MED ORDER — DEXTROSE 5 % IV SOLN
0.5000 ug/min | INTRAVENOUS | Status: DC
  Filled 2012-08-29: qty 4

## 2012-08-29 MED ORDER — MUPIROCIN 2 % EX OINT: TOPICAL_OINTMENT | Freq: Two times a day (BID) | CUTANEOUS | Status: DC

## 2012-08-29 MED ORDER — DOPAMINE-DEXTROSE 3.2-5 MG/ML-% IV SOLN
2.0000 ug/kg/min | INTRAVENOUS | Status: DC
  Filled 2012-08-29: qty 250

## 2012-08-29 MED ORDER — NITROGLYCERIN IN D5W 200-5 MCG/ML-% IV SOLN
2.0000 ug/min | INTRAVENOUS | Status: DC
  Filled 2012-08-29: qty 250

## 2012-08-29 MED ORDER — MAGNESIUM SULFATE 50 % IJ SOLN
40.0000 meq | INTRAMUSCULAR | Status: DC
  Filled 2012-08-29: qty 10

## 2012-08-29 MED ORDER — DEXTROSE 5 % IV SOLN
30.0000 ug/min | INTRAVENOUS | Status: AC
  Administered 2012-08-30: 15 ug/min via INTRAVENOUS
  Filled 2012-08-29: qty 2

## 2012-08-29 MED ORDER — SODIUM CHLORIDE 0.9 % IV SOLN
INTRAVENOUS | Status: DC
  Filled 2012-08-29: qty 30

## 2012-08-29 MED ORDER — PLASMA-LYTE 148 IV SOLN
INTRAVENOUS | Status: AC
  Administered 2012-08-30: 09:00:00
  Filled 2012-08-29: qty 2.5

## 2012-08-29 MED ORDER — METOPROLOL TARTRATE 12.5 MG HALF TABLET
12.5000 mg | ORAL_TABLET | Freq: Once | ORAL | Status: DC
  Filled 2012-08-29: qty 1

## 2012-08-29 MED ORDER — DEXTROSE 5 % IV SOLN
1.5000 g | INTRAVENOUS | Status: AC
  Administered 2012-08-30: .75 g via INTRAVENOUS
  Administered 2012-08-30: 1.5 g via INTRAVENOUS
  Filled 2012-08-29 (×2): qty 1.5

## 2012-08-29 MED ORDER — DEXTROSE 5 % IV SOLN
750.0000 mg | INTRAVENOUS | Status: DC
  Filled 2012-08-29: qty 750

## 2012-08-29 MED ORDER — VANCOMYCIN HCL 1000 MG IV SOLR
INTRAVENOUS | Status: AC
  Administered 2012-08-30: 09:00:00
  Filled 2012-08-29: qty 1000

## 2012-08-29 MED ORDER — SODIUM CHLORIDE 0.9 % IV SOLN
INTRAVENOUS | Status: AC
  Administered 2012-08-30: 1.5 [IU]/h via INTRAVENOUS
  Filled 2012-08-29: qty 1

## 2012-08-29 MED ORDER — SODIUM CHLORIDE 0.9 % IV SOLN
INTRAVENOUS | Status: AC
  Administered 2012-08-30: 70 mL/h via INTRAVENOUS
  Filled 2012-08-29: qty 40

## 2012-08-29 MED ORDER — VANCOMYCIN HCL 10 G IV SOLR
1250.0000 mg | INTRAVENOUS | Status: AC
  Administered 2012-08-30: 1250 mg via INTRAVENOUS
  Filled 2012-08-29: qty 1250

## 2012-08-29 MED ORDER — DEXMEDETOMIDINE HCL IN NACL 400 MCG/100ML IV SOLN
0.1000 ug/kg/h | INTRAVENOUS | Status: AC
  Administered 2012-08-30: 0.2 ug/kg/h via INTRAVENOUS
  Filled 2012-08-29: qty 100

## 2012-08-29 MED ORDER — POTASSIUM CHLORIDE 2 MEQ/ML IV SOLN
80.0000 meq | INTRAVENOUS | Status: DC
  Filled 2012-08-29: qty 40

## 2012-08-29 NOTE — Preoperative (Signed)
Beta Blockers   Reason not to administer Beta Blockers:Not Applicable 

## 2012-08-30 ENCOUNTER — Encounter (HOSPITAL_COMMUNITY): Payer: Self-pay | Admitting: *Deleted

## 2012-08-30 ENCOUNTER — Inpatient Hospital Stay (HOSPITAL_COMMUNITY): Payer: Medicare Other

## 2012-08-30 ENCOUNTER — Inpatient Hospital Stay (HOSPITAL_COMMUNITY)
Admission: RE | Admit: 2012-08-30 | Discharge: 2012-09-08 | DRG: 220 | Disposition: A | Discharge status: Skilled Nursing Facility | Payer: Medicare Other | Source: Ambulatory Visit | Attending: Thoracic Surgery (Cardiothoracic Vascular Surgery) | Admitting: Thoracic Surgery (Cardiothoracic Vascular Surgery)

## 2012-08-30 ENCOUNTER — Encounter (HOSPITAL_COMMUNITY): Payer: Self-pay | Admitting: Anesthesiology

## 2012-08-30 ENCOUNTER — Encounter (HOSPITAL_COMMUNITY)
Admission: RE | Disposition: A | Discharge status: Skilled Nursing Facility | Payer: Self-pay | Source: Ambulatory Visit | Attending: Thoracic Surgery (Cardiothoracic Vascular Surgery)

## 2012-08-30 ENCOUNTER — Inpatient Hospital Stay (HOSPITAL_COMMUNITY): Payer: Medicare Other | Admitting: Anesthesiology

## 2012-08-30 DIAGNOSIS — Z79899 Other long term (current) drug therapy: Secondary | ICD-10-CM

## 2012-08-30 DIAGNOSIS — I959 Hypotension, unspecified: Secondary | ICD-10-CM | POA: Diagnosis not present

## 2012-08-30 DIAGNOSIS — I739 Peripheral vascular disease, unspecified: Secondary | ICD-10-CM | POA: Diagnosis present

## 2012-08-30 DIAGNOSIS — E876 Hypokalemia: Secondary | ICD-10-CM | POA: Diagnosis not present

## 2012-08-30 DIAGNOSIS — J449 Chronic obstructive pulmonary disease, unspecified: Secondary | ICD-10-CM | POA: Diagnosis present

## 2012-08-30 DIAGNOSIS — Z96649 Presence of unspecified artificial hip joint: Secondary | ICD-10-CM

## 2012-08-30 DIAGNOSIS — R0600 Dyspnea, unspecified: Secondary | ICD-10-CM

## 2012-08-30 DIAGNOSIS — R5381 Other malaise: Secondary | ICD-10-CM | POA: Diagnosis not present

## 2012-08-30 DIAGNOSIS — D696 Thrombocytopenia, unspecified: Secondary | ICD-10-CM | POA: Diagnosis not present

## 2012-08-30 DIAGNOSIS — J90 Pleural effusion, not elsewhere classified: Secondary | ICD-10-CM | POA: Diagnosis not present

## 2012-08-30 DIAGNOSIS — Z01818 Encounter for other preprocedural examination: Secondary | ICD-10-CM

## 2012-08-30 DIAGNOSIS — I480 Paroxysmal atrial fibrillation: Secondary | ICD-10-CM | POA: Diagnosis not present

## 2012-08-30 DIAGNOSIS — IMO0001 Reserved for inherently not codable concepts without codable children: Secondary | ICD-10-CM

## 2012-08-30 DIAGNOSIS — D62 Acute posthemorrhagic anemia: Secondary | ICD-10-CM | POA: Diagnosis not present

## 2012-08-30 DIAGNOSIS — I350 Nonrheumatic aortic (valve) stenosis: Secondary | ICD-10-CM

## 2012-08-30 DIAGNOSIS — R079 Chest pain, unspecified: Secondary | ICD-10-CM

## 2012-08-30 DIAGNOSIS — I359 Nonrheumatic aortic valve disorder, unspecified: Principal | ICD-10-CM | POA: Diagnosis present

## 2012-08-30 DIAGNOSIS — J9819 Other pulmonary collapse: Secondary | ICD-10-CM | POA: Diagnosis not present

## 2012-08-30 DIAGNOSIS — Z01812 Encounter for preprocedural laboratory examination: Secondary | ICD-10-CM

## 2012-08-30 DIAGNOSIS — M129 Arthropathy, unspecified: Secondary | ICD-10-CM | POA: Diagnosis present

## 2012-08-30 DIAGNOSIS — R21 Rash and other nonspecific skin eruption: Secondary | ICD-10-CM | POA: Diagnosis not present

## 2012-08-30 DIAGNOSIS — Z953 Presence of xenogenic heart valve: Secondary | ICD-10-CM

## 2012-08-30 DIAGNOSIS — I10 Essential (primary) hypertension: Secondary | ICD-10-CM | POA: Diagnosis present

## 2012-08-30 DIAGNOSIS — E785 Hyperlipidemia, unspecified: Secondary | ICD-10-CM | POA: Diagnosis present

## 2012-08-30 DIAGNOSIS — K5900 Constipation, unspecified: Secondary | ICD-10-CM | POA: Diagnosis not present

## 2012-08-30 DIAGNOSIS — E8779 Other fluid overload: Secondary | ICD-10-CM | POA: Diagnosis not present

## 2012-08-30 DIAGNOSIS — J4489 Other specified chronic obstructive pulmonary disease: Secondary | ICD-10-CM | POA: Diagnosis present

## 2012-08-30 DIAGNOSIS — I4891 Unspecified atrial fibrillation: Secondary | ICD-10-CM | POA: Diagnosis not present

## 2012-08-30 DIAGNOSIS — H9190 Unspecified hearing loss, unspecified ear: Secondary | ICD-10-CM | POA: Diagnosis present

## 2012-08-30 DIAGNOSIS — Z01810 Encounter for preprocedural cardiovascular examination: Secondary | ICD-10-CM

## 2012-08-30 DIAGNOSIS — Z853 Personal history of malignant neoplasm of breast: Secondary | ICD-10-CM

## 2012-08-30 DIAGNOSIS — M810 Age-related osteoporosis without current pathological fracture: Secondary | ICD-10-CM | POA: Diagnosis present

## 2012-08-30 HISTORY — PX: AORTIC VALVE REPLACEMENT: SHX41

## 2012-08-30 HISTORY — DX: Presence of xenogenic heart valve: Z95.3

## 2012-08-30 HISTORY — PX: INTRAOPERATIVE TRANSESOPHAGEAL ECHOCARDIOGRAM: SHX5062

## 2012-08-30 HISTORY — DX: Paroxysmal atrial fibrillation: I48.0

## 2012-08-30 LAB — POCT I-STAT 3, ART BLOOD GAS (G3+)
Acid-Base Excess: 3 mmol/L — ABNORMAL HIGH (ref 0.0–2.0)
Acid-base deficit: 3 mmol/L — ABNORMAL HIGH (ref 0.0–2.0)
Bicarbonate: 26.3 mEq/L — ABNORMAL HIGH (ref 20.0–24.0)
Bicarbonate: 22 mEq/L (ref 20.0–24.0)
O2 Saturation: 100 %
O2 Saturation: 99 %
Patient temperature: 34.8
TCO2: 27 mmol/L (ref 0–100)
TCO2: 23 mmol/L (ref 0–100)
pCO2 arterial: 34.2 mmHg — ABNORMAL LOW (ref 35.0–45.0)
pCO2 arterial: 34 mmHg — ABNORMAL LOW (ref 35.0–45.0)
pH, Arterial: 7.494 — ABNORMAL HIGH (ref 7.350–7.450)
pH, Arterial: 7.408 (ref 7.350–7.450)
pO2, Arterial: 335 mmHg — ABNORMAL HIGH (ref 80.0–100.0)
pO2, Arterial: 123 mmHg — ABNORMAL HIGH (ref 80.0–100.0)

## 2012-08-30 LAB — POCT I-STAT 4, (NA,K, GLUC, HGB,HCT)
Glucose, Bld: 109 mg/dL — ABNORMAL HIGH (ref 70–99)
Glucose, Bld: 121 mg/dL — ABNORMAL HIGH (ref 70–99)
Glucose, Bld: 128 mg/dL — ABNORMAL HIGH (ref 70–99)
Glucose, Bld: 149 mg/dL — ABNORMAL HIGH (ref 70–99)
Glucose, Bld: 126 mg/dL — ABNORMAL HIGH (ref 70–99)
Glucose, Bld: 82 mg/dL (ref 70–99)
HCT: 32 % — ABNORMAL LOW (ref 36.0–46.0)
HCT: 20 % — ABNORMAL LOW (ref 36.0–46.0)
HCT: 29 % — ABNORMAL LOW (ref 36.0–46.0)
HCT: 26 % — ABNORMAL LOW (ref 36.0–46.0)
HCT: 27 % — ABNORMAL LOW (ref 36.0–46.0)
HCT: 34 % — ABNORMAL LOW (ref 36.0–46.0)
Hemoglobin: 10.9 g/dL — ABNORMAL LOW (ref 12.0–15.0)
Hemoglobin: 6.8 g/dL — CL (ref 12.0–15.0)
Hemoglobin: 9.9 g/dL — ABNORMAL LOW (ref 12.0–15.0)
Hemoglobin: 8.8 g/dL — ABNORMAL LOW (ref 12.0–15.0)
Hemoglobin: 9.2 g/dL — ABNORMAL LOW (ref 12.0–15.0)
Hemoglobin: 11.6 g/dL — ABNORMAL LOW (ref 12.0–15.0)
Potassium: 3.5 mEq/L (ref 3.5–5.1)
Potassium: 2.9 mEq/L — ABNORMAL LOW (ref 3.5–5.1)
Potassium: 3 mEq/L — ABNORMAL LOW (ref 3.5–5.1)
Potassium: 3.8 mEq/L (ref 3.5–5.1)
Potassium: 3.8 mEq/L (ref 3.5–5.1)
Potassium: 3.3 mEq/L — ABNORMAL LOW (ref 3.5–5.1)
Sodium: 138 mEq/L (ref 135–145)
Sodium: 131 mEq/L — ABNORMAL LOW (ref 135–145)
Sodium: 136 mEq/L (ref 135–145)
Sodium: 137 mEq/L (ref 135–145)
Sodium: 132 mEq/L — ABNORMAL LOW (ref 135–145)
Sodium: 137 mEq/L (ref 135–145)

## 2012-08-30 LAB — CREATININE, SERUM
Creatinine, Ser: 0.81 mg/dL (ref 0.50–1.10)
GFR calc Af Amer: 78 mL/min — ABNORMAL LOW (ref >90)
GFR calc non Af Amer: 67 mL/min — ABNORMAL LOW (ref >90)

## 2012-08-30 LAB — CBC
HCT: 33.7 % — ABNORMAL LOW (ref 36.0–46.0)
HCT: 34 % — ABNORMAL LOW (ref 36.0–46.0)
Hemoglobin: 12 g/dL (ref 12.0–15.0)
Hemoglobin: 11.8 g/dL — ABNORMAL LOW (ref 12.0–15.0)
MCH: 31.3 pg (ref 26.0–34.0)
MCH: 30.6 pg (ref 26.0–34.0)
MCHC: 35.6 g/dL (ref 30.0–36.0)
MCHC: 34.7 g/dL (ref 30.0–36.0)
MCV: 88 fL (ref 78.0–100.0)
MCV: 88.1 fL (ref 78.0–100.0)
Platelets: 108 10*3/uL — ABNORMAL LOW (ref 150–400)
Platelets: 111 10*3/uL — ABNORMAL LOW (ref 150–400)
RBC: 3.83 MIL/uL — ABNORMAL LOW (ref 3.87–5.11)
RBC: 3.86 MIL/uL — ABNORMAL LOW (ref 3.87–5.11)
RDW: 13 % (ref 11.5–15.5)
RDW: 13.2 % (ref 11.5–15.5)
WBC: 15.4 10*3/uL — ABNORMAL HIGH (ref 4.0–10.5)
WBC: 16.2 10*3/uL — ABNORMAL HIGH (ref 4.0–10.5)

## 2012-08-30 LAB — PROTIME-INR
INR: 1.47 (ref 0.00–1.49)
Prothrombin Time: 17.4 seconds — ABNORMAL HIGH (ref 11.6–15.2)

## 2012-08-30 LAB — PREPARE RBC (CROSSMATCH)

## 2012-08-30 LAB — HEMOGLOBIN AND HEMATOCRIT, BLOOD
HCT: 26.5 % — ABNORMAL LOW (ref 36.0–46.0)
Hemoglobin: 9.4 g/dL — ABNORMAL LOW (ref 12.0–15.0)

## 2012-08-30 LAB — MAGNESIUM: Magnesium: 3.2 mg/dL — ABNORMAL HIGH (ref 1.5–2.5)

## 2012-08-30 LAB — PLATELET COUNT: Platelets: 131 10*3/uL — ABNORMAL LOW (ref 150–400)

## 2012-08-30 LAB — APTT: aPTT: 44 seconds — ABNORMAL HIGH (ref 24–37)

## 2012-08-30 SURGERY — REPLACEMENT, AORTIC VALVE, OPEN
Anesthesia: General | Site: Chest | Wound class: Clean

## 2012-08-30 MED ORDER — SODIUM CHLORIDE 0.45 % IV SOLN
INTRAVENOUS | Status: DC
  Administered 2012-08-30: 20 mL/h via INTRAVENOUS
  Administered 2012-09-01 – 2012-09-02 (×2): via INTRAVENOUS

## 2012-08-30 MED ORDER — METOPROLOL TARTRATE 12.5 MG HALF TABLET
12.5000 mg | ORAL_TABLET | Freq: Two times a day (BID) | ORAL | Status: DC
  Administered 2012-08-31: 12.5 mg via ORAL
  Filled 2012-08-30 (×3): qty 1

## 2012-08-30 MED ORDER — SODIUM CHLORIDE 0.9 % IR SOLN
Status: DC | PRN
  Administered 2012-08-30: 6000 mL

## 2012-08-30 MED ORDER — LACTATED RINGERS IV SOLN
INTRAVENOUS | Status: DC | PRN
  Administered 2012-08-30: 08:00:00 via INTRAVENOUS

## 2012-08-30 MED ORDER — ACETAMINOPHEN 10 MG/ML IV SOLN
1000.0000 mg | Freq: Once | INTRAVENOUS | Status: AC
  Administered 2012-08-30: 1000 mg via INTRAVENOUS
  Filled 2012-08-30: qty 100

## 2012-08-30 MED ORDER — DEXMEDETOMIDINE HCL IN NACL 200 MCG/50ML IV SOLN: 0.1000 ug/kg/h | INTRAVENOUS | Status: DC

## 2012-08-30 MED ORDER — ONDANSETRON HCL 4 MG/2ML IJ SOLN
4.0000 mg | Freq: Four times a day (QID) | INTRAMUSCULAR | Status: DC | PRN
  Administered 2012-08-30 – 2012-08-31 (×2): 4 mg via INTRAVENOUS
  Filled 2012-08-30 (×2): qty 2

## 2012-08-30 MED ORDER — SODIUM CHLORIDE 0.9 % IR SOLN
Status: DC | PRN
  Administered 2012-08-30: 3000 mL

## 2012-08-30 MED ORDER — MIDAZOLAM HCL 2 MG/2ML IJ SOLN: 2.0000 mg | INTRAMUSCULAR | Status: DC | PRN

## 2012-08-30 MED ORDER — MORPHINE SULFATE 2 MG/ML IJ SOLN
1.0000 mg | INTRAMUSCULAR | Status: AC | PRN
Start: 2012-08-30 — End: 2012-08-31

## 2012-08-30 MED ORDER — ASPIRIN EC 325 MG PO TBEC
325.0000 mg | DELAYED_RELEASE_TABLET | Freq: Every day | ORAL | Status: DC
  Administered 2012-08-31 – 2012-09-03 (×4): 325 mg via ORAL
  Filled 2012-08-30 (×4): qty 1

## 2012-08-30 MED ORDER — MORPHINE SULFATE 2 MG/ML IJ SOLN
2.0000 mg | INTRAMUSCULAR | Status: DC | PRN
  Administered 2012-08-31 – 2012-09-01 (×5): 2 mg via INTRAVENOUS
  Filled 2012-08-30 (×5): qty 1

## 2012-08-30 MED ORDER — INSULIN ASPART 100 UNIT/ML ~~LOC~~ SOLN: 0.0000 [IU] | SUBCUTANEOUS | Status: DC

## 2012-08-30 MED ORDER — VANCOMYCIN HCL IN DEXTROSE 1-5 GM/200ML-% IV SOLN
1000.0000 mg | Freq: Once | INTRAVENOUS | Status: AC
  Administered 2012-08-30: 1000 mg via INTRAVENOUS
  Filled 2012-08-30: qty 200

## 2012-08-30 MED ORDER — HEPARIN SODIUM (PORCINE) 1000 UNIT/ML IJ SOLN
INTRAMUSCULAR | Status: DC | PRN
  Administered 2012-08-30: 24000 [IU] via INTRAVENOUS

## 2012-08-30 MED ORDER — SODIUM CHLORIDE 0.9 % IV SOLN
INTRAVENOUS | Status: DC
  Administered 2012-08-30: 20 mL/h via INTRAVENOUS

## 2012-08-30 MED ORDER — DOCUSATE SODIUM 100 MG PO CAPS
200.0000 mg | ORAL_CAPSULE | Freq: Every day | ORAL | Status: DC
  Administered 2012-08-31 – 2012-09-01 (×2): 200 mg via ORAL
  Filled 2012-08-30 (×2): qty 2

## 2012-08-30 MED ORDER — SODIUM CHLORIDE 0.9 % IV SOLN
INTRAVENOUS | Status: DC | PRN
  Administered 2012-08-30: 12:00:00 via INTRAVENOUS

## 2012-08-30 MED ORDER — SODIUM CHLORIDE 0.9 % IV SOLN
INTRAVENOUS | Status: DC
  Filled 2012-08-30 (×2): qty 1

## 2012-08-30 MED ORDER — DOPAMINE-DEXTROSE 3.2-5 MG/ML-% IV SOLN
3.0000 ug/kg/min | INTRAVENOUS | Status: AC
  Administered 2012-08-30: 3 ug/kg/min via INTRAVENOUS

## 2012-08-30 MED ORDER — ARTIFICIAL TEARS OP OINT
TOPICAL_OINTMENT | OPHTHALMIC | Status: DC | PRN
  Administered 2012-08-30: 1 via OPHTHALMIC

## 2012-08-30 MED ORDER — ASPIRIN 81 MG PO CHEW: 324.0000 mg | CHEWABLE_TABLET | Freq: Every day | ORAL | Status: DC

## 2012-08-30 MED ORDER — FENTANYL CITRATE 0.05 MG/ML IJ SOLN
INTRAMUSCULAR | Status: DC | PRN
  Administered 2012-08-30 (×5): 250 ug via INTRAVENOUS

## 2012-08-30 MED ORDER — INSULIN REGULAR BOLUS VIA INFUSION
0.0000 [IU] | Freq: Three times a day (TID) | INTRAVENOUS | Status: DC
  Filled 2012-08-30: qty 10

## 2012-08-30 MED ORDER — NITROGLYCERIN IN D5W 200-5 MCG/ML-% IV SOLN: 0.0000 ug/min | INTRAVENOUS | Status: DC

## 2012-08-30 MED ORDER — BISACODYL 5 MG PO TBEC
10.0000 mg | DELAYED_RELEASE_TABLET | Freq: Every day | ORAL | Status: DC
  Administered 2012-08-31 – 2012-09-01 (×2): 10 mg via ORAL
  Filled 2012-08-30 (×2): qty 2

## 2012-08-30 MED ORDER — OXYCODONE HCL 5 MG PO TABS: 5.0000 mg | ORAL_TABLET | ORAL | Status: DC | PRN

## 2012-08-30 MED ORDER — MIDAZOLAM HCL 5 MG/5ML IJ SOLN
INTRAMUSCULAR | Status: DC | PRN
  Administered 2012-08-30 (×5): 2 mg via INTRAVENOUS

## 2012-08-30 MED ORDER — ACETAMINOPHEN 500 MG PO TABS
1000.0000 mg | ORAL_TABLET | Freq: Four times a day (QID) | ORAL | Status: DC
  Administered 2012-08-31 – 2012-09-03 (×12): 1000 mg via ORAL
  Filled 2012-08-30 (×20): qty 2

## 2012-08-30 MED ORDER — POTASSIUM CHLORIDE 10 MEQ/50ML IV SOLN
10.0000 meq | INTRAVENOUS | Status: AC
  Administered 2012-08-30 (×3): 10 meq via INTRAVENOUS

## 2012-08-30 MED ORDER — LIDOCAINE HCL (CARDIAC) 20 MG/ML IV SOLN
INTRAVENOUS | Status: DC | PRN
  Administered 2012-08-30: 50 mg via INTRAVENOUS

## 2012-08-30 MED ORDER — ALBUMIN HUMAN 5 % IV SOLN
INTRAVENOUS | Status: DC | PRN
  Administered 2012-08-30: 12:00:00 via INTRAVENOUS

## 2012-08-30 MED ORDER — PROPOFOL 10 MG/ML IV BOLUS
INTRAVENOUS | Status: DC | PRN
  Administered 2012-08-30: 100 mg via INTRAVENOUS

## 2012-08-30 MED ORDER — ROCURONIUM BROMIDE 100 MG/10ML IV SOLN
INTRAVENOUS | Status: DC | PRN
  Administered 2012-08-30: 100 mg via INTRAVENOUS

## 2012-08-30 MED ORDER — PHENYLEPHRINE HCL 10 MG/ML IJ SOLN
0.0000 ug/min | INTRAMUSCULAR | Status: DC
  Filled 2012-08-30: qty 2

## 2012-08-30 MED ORDER — MUPIROCIN 2 % EX OINT
1.0000 "application " | TOPICAL_OINTMENT | Freq: Two times a day (BID) | CUTANEOUS | Status: AC
  Administered 2012-08-30 – 2012-09-02 (×8): 1 via NASAL
  Filled 2012-08-30: qty 22

## 2012-08-30 MED ORDER — DOPAMINE-DEXTROSE 3.2-5 MG/ML-% IV SOLN
INTRAVENOUS | Status: AC
  Filled 2012-08-30: qty 250

## 2012-08-30 MED ORDER — SODIUM CHLORIDE 0.9 % IJ SOLN: 3.0000 mL | INTRAMUSCULAR | Status: DC | PRN

## 2012-08-30 MED ORDER — LACTATED RINGERS IV SOLN
INTRAVENOUS | Status: DC
  Administered 2012-08-30: 20 mL/h via INTRAVENOUS

## 2012-08-30 MED ORDER — FAMOTIDINE IN NACL 20-0.9 MG/50ML-% IV SOLN
20.0000 mg | Freq: Two times a day (BID) | INTRAVENOUS | Status: AC
  Administered 2012-08-30: 20 mg via INTRAVENOUS

## 2012-08-30 MED ORDER — MAGNESIUM SULFATE 40 MG/ML IJ SOLN
4.0000 g | Freq: Once | INTRAMUSCULAR | Status: AC
  Administered 2012-08-30: 4 g via INTRAVENOUS

## 2012-08-30 MED ORDER — PANTOPRAZOLE SODIUM 40 MG PO TBEC
40.0000 mg | DELAYED_RELEASE_TABLET | Freq: Every day | ORAL | Status: DC
  Administered 2012-09-01 – 2012-09-03 (×3): 40 mg via ORAL
  Filled 2012-08-30 (×3): qty 1

## 2012-08-30 MED ORDER — PROTAMINE SULFATE 10 MG/ML IV SOLN
INTRAVENOUS | Status: DC | PRN
  Administered 2012-08-30: 220 mg via INTRAVENOUS

## 2012-08-30 MED ORDER — INSULIN ASPART 100 UNIT/ML ~~LOC~~ SOLN
0.0000 [IU] | SUBCUTANEOUS | Status: AC
  Administered 2012-08-30 (×2): 2 [IU] via SUBCUTANEOUS
  Administered 2012-08-30: 4 [IU] via SUBCUTANEOUS

## 2012-08-30 MED ORDER — SODIUM CHLORIDE 0.9 % IV SOLN: 250.0000 mL | INTRAVENOUS | Status: DC

## 2012-08-30 MED ORDER — BISACODYL 10 MG RE SUPP: 10.0000 mg | Freq: Every day | RECTAL | Status: DC

## 2012-08-30 MED ORDER — CALCIUM CHLORIDE 10 % IV SOLN
1.0000 g | Freq: Once | INTRAVENOUS | Status: AC | PRN
  Filled 2012-08-30: qty 10

## 2012-08-30 MED ORDER — SODIUM CHLORIDE 0.9 % IJ SOLN
3.0000 mL | Freq: Two times a day (BID) | INTRAMUSCULAR | Status: DC
  Administered 2012-08-31 – 2012-09-03 (×7): 3 mL via INTRAVENOUS

## 2012-08-30 MED ORDER — CHLORHEXIDINE GLUCONATE CLOTH 2 % EX PADS: 6.0000 | MEDICATED_PAD | Freq: Every day | CUTANEOUS | Status: DC

## 2012-08-30 MED ORDER — DEXTROSE 5 % IV SOLN
1.5000 g | Freq: Two times a day (BID) | INTRAVENOUS | Status: AC
  Administered 2012-08-31 – 2012-09-01 (×4): 1.5 g via INTRAVENOUS
  Filled 2012-08-30 (×4): qty 1.5

## 2012-08-30 MED ORDER — ACETAMINOPHEN 160 MG/5ML PO SOLN
975.0000 mg | Freq: Four times a day (QID) | ORAL | Status: DC
  Filled 2012-08-30: qty 40

## 2012-08-30 MED ORDER — METOPROLOL TARTRATE 25 MG/10 ML ORAL SUSPENSION
12.5000 mg | Freq: Two times a day (BID) | ORAL | Status: DC
  Filled 2012-08-30 (×3): qty 5

## 2012-08-30 MED ORDER — VECURONIUM BROMIDE 10 MG IV SOLR
INTRAVENOUS | Status: DC | PRN
  Administered 2012-08-30: 2 mg via INTRAVENOUS
  Administered 2012-08-30 (×2): 5 mg via INTRAVENOUS

## 2012-08-30 MED ORDER — ALBUMIN HUMAN 5 % IV SOLN
250.0000 mL | INTRAVENOUS | Status: AC | PRN
  Administered 2012-08-30 (×4): 250 mL via INTRAVENOUS
  Filled 2012-08-30 (×3): qty 250

## 2012-08-30 MED ORDER — METOPROLOL TARTRATE 1 MG/ML IV SOLN: 2.5000 mg | INTRAVENOUS | Status: DC | PRN

## 2012-08-30 MED ORDER — FESOTERODINE FUMARATE ER 8 MG PO TB24
8.0000 mg | ORAL_TABLET | Freq: Every day | ORAL | Status: DC
  Administered 2012-08-31 – 2012-09-08 (×9): 8 mg via ORAL
  Filled 2012-08-30 (×10): qty 1

## 2012-08-30 MED ORDER — SODIUM CHLORIDE 0.9 % IJ SOLN
OROMUCOSAL | Status: DC | PRN
  Administered 2012-08-30: 09:00:00 via TOPICAL

## 2012-08-30 MED ORDER — MUPIROCIN 2 % EX OINT: TOPICAL_OINTMENT | Freq: Two times a day (BID) | CUTANEOUS | Status: DC

## 2012-08-30 SURGICAL SUPPLY — 82 items
ADAPTER CARDIO PERF ANTE/RETRO (ADAPTER) ×3 IMPLANT
ADPR PRFSN 84XANTGRD RTRGD (ADAPTER) ×2
ATTRACTOMAT 16X20 MAGNETIC DRP (DRAPES) ×3 IMPLANT
BLADE STERNUM SYSTEM 6 (BLADE) ×3 IMPLANT
BLADE SURG 11 STRL SS (BLADE) ×3 IMPLANT
CANISTER SUCTION 2500CC (MISCELLANEOUS) ×3 IMPLANT
CANNULA GUNDRY RCSP 15FR (MISCELLANEOUS) ×3 IMPLANT
CANNULA SOFTFLOW AORTIC 7M21FR (CANNULA) ×3 IMPLANT
CATH CPB KIT OWEN (MISCELLANEOUS) ×3 IMPLANT
CATH HEART VENT LEFT (CATHETERS) ×2 IMPLANT
CATH THORACIC 36FR RT ANG (CATHETERS) ×3 IMPLANT
CLOTH BEACON ORANGE TIMEOUT ST (SAFETY) ×3 IMPLANT
COVER SURGICAL LIGHT HANDLE (MISCELLANEOUS) ×3 IMPLANT
CRADLE DONUT ADULT HEAD (MISCELLANEOUS) ×3 IMPLANT
DRAIN CHANNEL 32F RND 10.7 FF (WOUND CARE) ×3 IMPLANT
DRAPE BILATERAL SPLIT (DRAPES) IMPLANT
DRAPE CARDIOVASCULAR INCISE (DRAPES)
DRAPE CV SPLIT W-CLR ANES SCRN (DRAPES) IMPLANT
DRAPE INCISE IOBAN 66X45 STRL (DRAPES) ×3 IMPLANT
DRAPE SLUSH/WARMER DISC (DRAPES) ×3 IMPLANT
DRAPE SRG 135X102X78XABS (DRAPES) IMPLANT
DRSG COVADERM 4X14 (GAUZE/BANDAGES/DRESSINGS) ×3 IMPLANT
ELECT REM PT RETURN 9FT ADLT (ELECTROSURGICAL) ×6
ELECTRODE REM PT RTRN 9FT ADLT (ELECTROSURGICAL) ×4 IMPLANT
GLOVE BIO SURGEON STRL SZ 6 (GLOVE) IMPLANT
GLOVE BIO SURGEON STRL SZ 6.5 (GLOVE) ×12 IMPLANT
GLOVE BIO SURGEON STRL SZ7 (GLOVE) ×4 IMPLANT
GLOVE BIO SURGEON STRL SZ7.5 (GLOVE) IMPLANT
GLOVE BIOGEL PI IND STRL 6 (GLOVE) ×2 IMPLANT
GLOVE BIOGEL PI IND STRL 7.0 (GLOVE) ×6 IMPLANT
GLOVE BIOGEL PI INDICATOR 6 (GLOVE) ×1
GLOVE BIOGEL PI INDICATOR 7.0 (GLOVE) ×3
GLOVE ORTHO TXT STRL SZ7.5 (GLOVE) ×9 IMPLANT
GOWN STRL NON-REIN LRG LVL3 (GOWN DISPOSABLE) ×24 IMPLANT
HEMOSTAT POWDER SURGIFOAM 1G (HEMOSTASIS) ×9 IMPLANT
INSERT FOGARTY XLG (MISCELLANEOUS) ×3 IMPLANT
KIT BASIN OR (CUSTOM PROCEDURE TRAY) ×3 IMPLANT
KIT ROOM TURNOVER OR (KITS) ×3 IMPLANT
KIT SUCTION CATH 14FR (SUCTIONS) ×9 IMPLANT
LEAD PACING MYOCARDI (MISCELLANEOUS) ×3 IMPLANT
NS IRRIG 1000ML POUR BTL (IV SOLUTION) ×15 IMPLANT
PACK OPEN HEART (CUSTOM PROCEDURE TRAY) ×3 IMPLANT
PAD ARMBOARD 7.5X6 YLW CONV (MISCELLANEOUS) ×6 IMPLANT
SET IRRIG TUBING LAPAROSCOPIC (IRRIGATION / IRRIGATOR) ×3 IMPLANT
SPONGE GAUZE 4X4 12PLY (GAUZE/BANDAGES/DRESSINGS) ×4 IMPLANT
SUT BONE WAX W31G (SUTURE) ×3 IMPLANT
SUT ETHIBON 2 0 V 52N 30 (SUTURE) ×6 IMPLANT
SUT ETHIBON EXCEL 2-0 V-5 (SUTURE) IMPLANT
SUT ETHIBOND 2 0 SH (SUTURE)
SUT ETHIBOND 2 0 SH 36X2 (SUTURE) IMPLANT
SUT ETHIBOND 2 0 V4 (SUTURE) IMPLANT
SUT ETHIBOND 2 0V4 GREEN (SUTURE) IMPLANT
SUT ETHIBOND 4 0 RB 1 (SUTURE) IMPLANT
SUT ETHIBOND V-5 VALVE (SUTURE) IMPLANT
SUT ETHIBOND X763 2 0 SH 1 (SUTURE) ×9 IMPLANT
SUT MNCRL AB 3-0 PS2 18 (SUTURE) ×6 IMPLANT
SUT PDS AB 1 CTX 36 (SUTURE) ×6 IMPLANT
SUT PROLENE 3 0 SH DA (SUTURE) ×2 IMPLANT
SUT PROLENE 3 0 SH1 36 (SUTURE) ×4 IMPLANT
SUT PROLENE 4 0 RB 1 (SUTURE) ×15
SUT PROLENE 4 0 SH DA (SUTURE) ×3 IMPLANT
SUT PROLENE 4-0 RB1 .5 CRCL 36 (SUTURE) ×7 IMPLANT
SUT PROLENE 5 0 C 1 36 (SUTURE) IMPLANT
SUT PROLENE 6 0 C 1 30 (SUTURE) IMPLANT
SUT SILK  1 MH (SUTURE) ×1
SUT SILK 1 MH (SUTURE) ×2 IMPLANT
SUT SILK 2 0 SH CR/8 (SUTURE) IMPLANT
SUT SILK 3 0 SH CR/8 (SUTURE) IMPLANT
SUT STEEL 6MS V (SUTURE) IMPLANT
SUT VIC AB 1 CTX 18 (SUTURE) ×1 IMPLANT
SUT VIC AB 2-0 CTX 27 (SUTURE) IMPLANT
SUTURE E-PAK OPEN HEART (SUTURE) ×3 IMPLANT
SYSTEM SAHARA CHEST DRAIN ATS (WOUND CARE) ×3 IMPLANT
TAPE CLOTH SURG 4X10 WHT LF (GAUZE/BANDAGES/DRESSINGS) ×2 IMPLANT
TOWEL OR 17X24 6PK STRL BLUE (TOWEL DISPOSABLE) ×6 IMPLANT
TOWEL OR 17X26 10 PK STRL BLUE (TOWEL DISPOSABLE) ×6 IMPLANT
TRAY FOLEY IC TEMP SENS 14FR (CATHETERS) ×3 IMPLANT
TUBE SUCT INTRACARD DLP 20F (MISCELLANEOUS) ×3 IMPLANT
UNDERPAD 30X30 INCONTINENT (UNDERPADS AND DIAPERS) ×3 IMPLANT
VALVE MAGNA EASE 21MM (Prosthesis & Implant Heart) ×3 IMPLANT
VENT LEFT HEART 12002 (CATHETERS) ×3
WATER STERILE IRR 1000ML POUR (IV SOLUTION) ×6 IMPLANT

## 2012-08-30 NOTE — Progress Notes (Signed)
Patient unable to be extubated at this time secondary to heavy drowsiness and apnea when placed on CPAP setting per Resp Therapy. No acute distress noted. Patient follows commands, but quickly falls right back to sleep.  VSS. Reported off to oncoming shift RN.

## 2012-08-30 NOTE — Progress Notes (Signed)
Patient ID: [name], female   DOB: [birth year], 79 y.o.   MRN: [id number] EVENING ROUNDS NOTE :     [address]       [name] [address]             [phone]                 Day of Surgery Procedure(s) (LRB): AORTIC VALVE REPLACEMENT (AVR) (N/A) INTRAOPERATIVE TRANSESOPHAGEAL ECHOCARDIOGRAM (N/A)  Total Length of Stay:  LOS: 0 days  BP 90/47  Pulse 65  Temp(Src) 96.3 F (35.7 C) (Oral)  Resp 12  Ht 5\' 3"  (1.6 m)  Wt 161 lb (73.029 kg)  BMI 28.53 kg/m2  SpO2 100%  .Intake/Output     07/01 0701 - 07/02 0700 07/02 0701 - 07/03 0700   I.V. (mL/kg)  2120 (29)   Blood  469   NG/GT  30   IV Piggyback  940   Total Intake(mL/kg)  3559 (48.7)   Urine (mL/kg/hr)  2900 (3.9)   Blood  1050 (1.4)   Chest Tube  95 (0.1)   Total Output   4045   Net   -486          . sodium chloride 20 mL/hr (08/30/12 1300)  . sodium chloride 20 mL/hr (08/30/12 1400)  . [START ON 08/31/2012] sodium chloride    . dexmedetomidine Stopped (08/30/12 1620)  . insulin (NOVOLIN-R) infusion Stopped (08/30/12 1600)  . lactated ringers 20 mL/hr (08/30/12 1621)  . nitroGLYCERIN Stopped (08/30/12 1620)  . phenylephrine (NEO-SYNEPHRINE) Adult infusion Stopped (08/30/12 1451)     Lab Results  Component Value Date   WBC 15.4* 08/30/2012   HGB 12.0 08/30/2012   HCT 33.7* 08/30/2012   PLT 108* 08/30/2012   GLUCOSE 82 08/30/2012   CHOL 156 07/11/2012   TRIG 156* 07/11/2012   HDL 41 07/11/2012   LDLCALC 84 07/11/2012   ALT 10 08/28/2012   AST 15 08/28/2012   NA 137 08/30/2012   K 3.3* 08/30/2012   CL 98 08/28/2012   CREATININE 0.84 08/28/2012   BUN 11 08/28/2012   CO2 23 08/28/2012   TSH 1.366 08/08/2012   INR 1.47 08/30/2012   HGBA1C 5.5 08/28/2012   Still sleepy, does follow some commands Not bleeding Hypokalemia being corrected Sinus at 60, will start a pacing to increase rate   Delight Ovens MD  Beeper [phone] Office [phone] 08/30/2012 5:18 PM

## 2012-08-30 NOTE — Progress Notes (Signed)
  Echocardiogram Echocardiogram Transesophageal has been performed.  Margreta Journey 08/30/2012, 12:31 PM

## 2012-08-30 NOTE — Op Note (Signed)
CARDIOTHORACIC SURGERY OPERATIVE NOTE  Date of Procedure:  08/30/2012  Preoperative Diagnosis: Severe Aortic Stenosis   Postoperative Diagnosis: Same   Procedure:    Aortic Valve Replacement  [employer] Magn[employer] Ease Pericardial Tissue Valve (size 21 mm, model # 3300TFX, serial # [id number])   Surgeon: Salvatore Decent. Cornelius Moras, MD  Assistant: Ardelle Balls, PA-C  Anesthesia: Raiford Simmonds, MD  Operative Findings:  Severe calcific aortic stenosis  Normal LV systolic function         BRIEF CLINICAL NOTE AND INDICATIONS FOR SURGERY  Patient is a 79 year old married white female from [place] with known history of aortic stenosis and hypertension who has been followed by Dr. Allyson Sabal for several years. Her last previous echocardiogram demonstrated moderate to severe aortic stenosis with peak and mean transvalvular gradients estimated 57 and 35 mm mercury respectively with aortic valve area estimated 0.83 cm. At that time the patient remained asymptomatic. Over the past year the patient has developed progressive symptoms of exertional shortness of breath and substernal chest tightness radiating up to her throat and jaw.  Symptoms are always brought on with physical exertion and relieved by rest. The patient has had some mild dizzy spells without syncope. She denies any resting chest pain or shortness of breath. She denies any history of PND, orthopnea, or lower extremity edema.  She recently underwent a followup echocardiogram which demonstrated the presence of critical aortic stenosis with peak velocity across the valve measuring 5 m/sec corresponding to peak and mean transvalvular gradients estimated 102 and 63 mm mercury respectively.  Left ventricular size and systolic function remains normal with ejection fraction estimated 65-70%. The patient was brought in for elective  Cardiac catheterization by Dr. Allyson Sabal which confirmed the presence of critical aortic stenosis with peak to peak valve  gradient measured 63 mm mercury and valve area estimated to be 0.37 cm2. She was found to have no significant coronary artery disease. Pulmonary artery pressures measured 34/16 with pulmonary catheter wedge pressure 18.  Cardiothoracic surgical consultation was requested.  The patient has been seen in consultation and counseled at length regarding the indications, risks and potential benefits of surgery.  All questions have been answered, and the patient provides full informed consent for the operation as described.     DETAILS OF THE OPERATIVE PROCEDURE  The patient is brought to the operating room on the above mentioned date and central monitoring was established by the anesthesia team including placement of Swan-Ganz catheter and radial arterial line. The patient is placed in the supine position on the operating table.  Intravenous antibiotics are administered. General endotracheal anesthesia is induced uneventfully. A Foley catheter is placed.  Baseline transesophageal echocardiogram was performed.  Findings were notable for severe calcific aortic stenosis with normal LV systolic function.  The patient's chest, abdomen, both groins, and both lower extremities are prepared and draped in a sterile manner. A time out procedure is performed.  A median sternotomy incision was performed and the pericardium is opened. The ascending aorta is normal in appearance. The ascending aorta and the right atrium are cannulated for cardioplegia bypass.  Adequate heparinization is verified.   A retrograde cardioplegia cannula is placed through the right atrium into the coronary sinus.  The entire pre-bypass portion of the operation was notable for stable hemodynamics.  Cardiopulmonary bypass was begun and the surface of the heart is inspected.  A cardioplegia cannula is placed in the ascending aorta.  A temperature probe was placed in the interventricular septum.  The  patient is cooled to 32C systemic  temperature.  The aortic cross clamp is applied and cold blood cardioplegia is delivered initially in an antegrade fashion through the aortic root.   Supplemental cardioplegia is given retrograde through the coronary sinus catheter.  Iced saline slush is applied for topical hypothermia.  The initial cardioplegic arrest is rapid with early diastolic arrest.  Repeat doses of cardioplegia are administered intermittently throughout the entire cross clamp portion of the operation through the coronary sinus catheter in order to maintain completely flat electrocardiogram and septal myocardial temperature below 15C.  Myocardial protection was felt to be excellent.  An oblique transverse aortotomy incision was performed.  The aortic valve was inspected and notable for severe aortic stenosis.  The aortic valve leaflets were excised sharply and the aortic annulus decalcified.  Decalcification was notably challenging due to extensive annular calcification.  The aortic annulus was sized to accept a 21 mm prosthesis.  The aortic root and left ventricle were irrigated with copious cold saline solution.  Aortic valve replacement was performed using interrupted horizontal mattress 2-0 Ethibond pledgeted sutures with pledgets in the subannular position.  An [facility] Ease pericardial tissue valve (size 21 mm, model # 3300TFX, serial # [id number]) was implanted uneventfully. The valve seated appropriately with adequate space beneath the left main and right coronary artery.  The aortotomy was closed using a 2-layer closure of running 4-0 Prolene suture.  One final dose of warm retrograde "hot shot" cardioplegia was administered retrograde through the coronary sinus catheter while all air was evacuated through the aortic root.  The aortic cross clamp was removed after a total cross clamp time of 61 minutes.  Epicardial pacing wires are fixed to the right ventricular outflow tract and to the right atrial appendage. The patient  is rewarmed to 37C temperature. The aortic and left ventricular vents are removed.  The patient is weaned and disconnected from cardiopulmonary bypass.  The patient's rhythm at separation from bypass was normal sinus.  The patient was weaned from cardioplegic bypass without any inotropic support. Total cardiopulmonary bypass time for the operation was 77 minutes.  Followup transesophageal echocardiogram performed after separation from bypass revealed a well-seated aortic valve prosthesis that was functioning normally and without any sign of perivalvular leak.  Left ventricular function was unchanged from preoperatively.  The aortic and venous cannula were removed uneventfully. Protamine was administered to reverse the anticoagulation. The mediastinum and pleural space were inspected for hemostasis and irrigated with saline solution. The mediastinum was drained using 2 chest tubes placed through separate stab incisions inferiorly.  The soft tissues anterior to the aorta were reapproximated loosely. The sternum is closed with double strength sternal wire. The soft tissues anterior to the sternum were closed in multiple layers and the skin is closed with a running subcuticular skin closure.  The post-bypass portion of the operation was notable for stable rhythm and hemodynamics.  No blood products were administered during the operation.  The patient tolerated the procedure well and is transported to the surgical intensive care in stable condition. There are no intraoperative complications. All sponge instrument and needle counts are verified correct at completion of the operation.    Salvatore Decent. Cornelius Moras MD 08/30/2012 12:31 PM

## 2012-08-30 NOTE — Brief Op Note (Addendum)
08/30/2012  11:24 AM  PATIENT:  [name]  79 y.o. female  PRE-OPERATIVE DIAGNOSIS:  Critical AS  POST-OPERATIVE DIAGNOSIS:  Critical AS  PROCEDURE:  INTRAOPERATIVE TRANSESOPHAGEAL ECHOCARDIOGRAM, MEDIAN STERNOTOMY for AORTIC VALVE REPLACEMENT (AVR) Using a Pericardial Tissue Valve, size 21 mm  SURGEON:    Purcell Nails, MD  ASSISTANTS:  Ardelle Balls, PA-C  ANESTHESIA:   Raiford Simmonds, MD  CROSSCLAMP TIME:   25'  CARDIOPULMONARY BYPASS TIME: 80'  FINDINGS:  Severe calcific aortic stenosis  Normal LV systolic function  COMPLICATIONS: none  PRE OP WEIGHT: 73 kg  PATIENT DISPOSITION:   TO SICU IN STABLE CONDITION  [name],[name] 08/30/2012 12:26 PM

## 2012-08-30 NOTE — Transfer of Care (Signed)
Immediate Anesthesia Transfer of Care Note  Patient: [name]  Procedure(s) Performed: Procedure(s): AORTIC VALVE REPLACEMENT (AVR) (N/A) INTRAOPERATIVE TRANSESOPHAGEAL ECHOCARDIOGRAM (N/A)  Patient Location: PACU  Anesthesia Type:General  Level of Consciousness: sedated, unresponsive and Patient remains intubated per anesthesia plan  Airway & Oxygen Therapy: Patient remains intubated per anesthesia plan and Patient placed on Ventilator (see vital sign flow sheet for setting)  Post-op Assessment: Report given to PACU RN and Post -op Vital signs reviewed and stable  Post vital signs: Reviewed and stable  Complications: No apparent anesthesia complications

## 2012-08-30 NOTE — H&P (Signed)
[address]       [name] [address]             [phone]          CARDIOTHORACIC SURGERY HISTORY AND PHYSICAL EXAM  PCP is Oneal Grout, MD PCP is Terald Sleeper, MD Referring Provider is BERRY, Delton See, MD   Reason for consultation:  Severe aortic stenosis  HPI:  Patient is a 79 year old married white female from [place] with known history of aortic stenosis and hypertension who has been followed by Dr. Allyson Sabal for several years. Her last previous echocardiogram demonstrated moderate to severe aortic stenosis with peak and mean transvalvular gradients estimated 57 and 35 mm mercury respectively with aortic valve area estimated 0.83 cm. At that time the patient remained asymptomatic. Over the past year the patient has developed progressive symptoms of exertional shortness of breath and substernal chest tightness radiating up to her throat and jaw.  Symptoms are always brought on with physical exertion and relieved by rest. The patient has had some mild dizzy spells without syncope. She denies any resting chest pain or shortness of breath. She denies any history of PND, orthopnea, or lower extremity edema.  She recently underwent a followup echocardiogram which demonstrated the presence of critical aortic stenosis with peak velocity across the valve measuring 5 m/sec corresponding to peak and mean transvalvular gradients estimated 102 and 63 mm mercury respectively.  Left ventricular size and systolic function remains normal with ejection fraction estimated 65-70%. The patient was brought in for elective  Cardiac catheterization by Dr. Allyson Sabal which confirmed the presence of critical aortic stenosis with peak to peak valve gradient measured 63 mm mercury and valve area estimated to be 0.37 cm2. She was found to have no significant coronary artery disease. Pulmonary artery pressures measured 34/16 with pulmonary catheter wedge pressure 18.  Cardiothoracic  surgical consultation was requested.    Past Medical History  Diagnosis Date  . Arthritis   . Hyperlipidemia   . Heart murmur   . Hearing loss   . Change in voice   . Osteoporosis   . Cancer     Breast  . Aortic valve disorder 08/13/2011    ECHO - EF >55%; mild diastolic dysfunction; calcified aortic valve, not well visualized; mod/severe aortic stenosis w/ worsening gradients when compared to 2012  . PVD (peripheral vascular disease) 08/13/2010    R/P MV - normal pattern of perfusion in all regions, EF 76%; no significant wall abnormalities noted; normal perfusion study  . Carotid bruit 08/06/2008    Doppler - R ECA demonstrates noarrowing w/ elevated velocities consistent w/ >70% diameter reduction; R and L ICAs show no evidence of diameter reduction, significant tortuosity or vascular abnormality;   . Aortic stenosis 07/20/2012    Aortic stenosis   . Hypertension     dr Allyson Sabal    Past Surgical History  Procedure Laterality Date  . Breast lumpectomy  02/25/1997    right  . Mastectomy  09/29/95    left  . Total hip arthroplasty  09/2010  . Colon surgery  1959  . Abdominal hysterectomy      Partial  . Eye surgery  1997    Cataract surgery  . Biopsy shoulder Left 10/08/2005    shave biopsy  . Cosmetic surgery Left 1997    Breast implant  . Left heart cath  08/14/12    Nl cors, AS  . Cardiac catheterization  Family History  Problem Relation Age of Onset  . Cancer Mother     Bladder  . Early death Father     Tractor accident  . Early death Brother     Tax inspector  . Early death Brother     during heart surgery    Social History History  Substance Use Topics  . Smoking status: Never Smoker   . Smokeless tobacco: Never Used  . Alcohol Use: No    Prior to Admission medications   Medication Sig Start Date End Date Taking? Authorizing Provider  acetaminophen (TYLENOL) 500 MG tablet Take 500 mg by mouth as needed for pain.   Yes Historical Provider, MD  Biotin  5000 MCG CAPS Take 1 capsule by mouth daily.   Yes Historical Provider, MD  Calcium Citrate-Vitamin D (CITRACAL + D PO) Take 1 tablet by mouth 2 (two) times daily.    Yes Historical Provider, MD  fesoterodine (TOVIAZ) 8 MG TB24 Take 8 mg by mouth daily.   Yes Historical Provider, MD  hydrochlorothiazide 25 MG tablet Take 25 mg by mouth daily.     Yes Historical Provider, MD  Magnesium Hydroxide (PHILLIPS MILK OF MAGNESIA PO) Take 15 mLs by mouth at bedtime.    Yes Historical Provider, MD  metoprolol succinate (TOPROL-XL) 25 MG 24 hr tablet Take 0.5 tablets (12.5 mg total) by mouth daily. 08/08/12  Yes Claudie Revering, NP  mupirocin ointment (BACTROBAN) 2 % Apply topically 2 (two) times daily. Apply inside both nostrils two times daily x 5 days 08/29/12  Yes Purcell Nails, MD  omeprazole (PRILOSEC) 20 MG capsule TAKE 2 CAPSULES BY MOUTH DAILY FOR REFLUX 08/27/12  Yes Tiffany L Reed, DO  shark liver oil-cocoa butter (PREPARATION H) 0.25-3-85.5 % suppository Place 1 suppository rectally as needed for hemorrhoids.   Yes Historical Provider, MD  triamcinolone (NASACORT) 55 MCG/ACT nasal inhaler Place 2 sprays into the nose daily as needed (for allergies).   Yes Historical Provider, MD  vitamin C (ASCORBIC ACID) 500 MG tablet Take 500 mg by mouth daily.   Yes Historical Provider, MD  vitamin E 400 UNIT capsule Take 400 Units by mouth daily.   Yes Historical Provider, MD  zolpidem (AMBIEN) 10 MG tablet Take 10 mg by mouth at bedtime as needed for sleep.   Yes Historical Provider, MD  fexofenadine (ALLEGRA) 180 MG tablet Take 180 mg by mouth daily as needed (for allergies).    Historical Provider, MD  hydrOXYzine (ATARAX/VISTARIL) 25 MG tablet Take 25 mg by mouth as needed for itching.    Historical Provider, MD    Allergies  Allergen Reactions  . Codeine Rash    All over the body.     Review of Systems:              General:                      normal appetite, decreased energy, no weight gain, no  weight loss, no fever             Cardiac:                      + chest pain with exertion, no chest pain at rest, + SOB with moderate exertion, no resting SOB, no PND, no orthopnea, no palpitations, no arrhythmia, no atrial fibrillation, no LE edema, + dizzy spells, no syncope  Respiratory:                + exertional shortness of breath, no home oxygen, no productive cough, no dry cough, no bronchitis, no wheezing, no hemoptysis, no asthma, no pain with inspiration or cough, no sleep apnea, no CPAP at night             GI:                                no difficulty swallowing, no reflux, no frequent heartburn, no hiatal hernia, no abdominal pain, + constipation, no diarrhea, no hematochezia, no hematemesis, no melena             GU:                              no dysuria,  no frequency, no urinary tract infection, no hematuria, no kidney stones, no kidney disease             Vascular:                     no pain suggestive of claudication, no pain in feet, no leg cramps, no varicose veins, no DVT, no non-healing foot ulcer             Neuro:                         no stroke, no TIA's, no seizures, no headaches, no temporary blindness one eye,  no slurred speech, no peripheral neuropathy, no chronic pain, no instability of gait, no memory/cognitive dysfunction             Musculoskeletal:         + arthritis primarily in hands, no joint swelling, no myalgias, no difficulty walking, normal mobility               Skin:                            + h/o recurrent rash severe rash in the past presumably due to allergies but no clear source ever identified - can become quite severe and associated with dysphagia, no itching, no skin infections, no pressure sores or ulcerations             Psych:                         no anxiety, no depression, no nervousness, no unusual recent stress             Eyes:                           no blurry vision, no floaters, no recent vision changes,  wears  glasses or contacts             ENT:                            no hearing loss, no loose or painful teeth             Hematologic:               no easy bruising, no abnormal bleeding, no clotting disorder,  no frequent epistaxis             Endocrine:                   no diabetes, does not check CBG's at home                           Physical Exam:              BP 113/54  Pulse 65  Temp(Src) 97.7 F (36.5 C) (Oral)  Resp 18  Ht 5' 3.5" (1.613 m)  Wt 73.483 kg (162 lb)  BMI 28.24 kg/m2  SpO2 96%             General:                      Somewhat elderly but o/w  well-appearing             HEENT:                       Unremarkable               Neck:                           no JVD, no bruits, no adenopathy               Chest:                         clear to auscultation, symmetrical breath sounds, no wheezes, no rhonchi               CV:                              RRR, grade IV/VI crescendo/decrescendo systolic murmur               Abdomen:                    soft, non-tender, no masses               Extremities:                 warm, well-perfused, pulses diminished             Rectal/GU                   Deferred             Neuro:                         Grossly non-focal and symmetrical throughout             Skin:                            Clean and dry, no rashes, no breakdown, thin and somewhat frail  Diagnostic Tests:     Transthoracic Echocardiography  Patient: Talyia, Allende MR #: [id number] Study Date: 07/18/2012 Gender: F Age: 89 Height: 160cm Weight: 73.5kg BSA: 1.7m^2 Pt. Status: Room:  ORDERING Nanetta Batty, MD REFERRING Nanetta Batty, MD ATTENDING Zoila Shutter REFERRING Zoila Shutter SONOGRAPHER Clearence Ped, RCS PERFORMING [facility] cc:  ------------------------------------------------------------ LV EF: 65% - 70%  ------------------------------------------------------------  Indications: 424.1 Aortic valve  disorders.  ------------------------------------------------------------ History: PMH: Jaw Pain Dyspnea and murmur.  ------------------------------------------------------------ Study Conclusions  - Aortic valve: Mildly to moderately calcified annulus. Probably trileaflet; normal thickness, moderately calcified leaflets. Moderate focal thickening and calcification involving the right coronary and noncoronary cusp. Cusp separation was reduced. Valve mobility was restricted. Transvalvular velocity was increased, due to stenosis. There was critical stenosis. Trivial regurgitation. Peak velocity: 505cm/s (S). Mean gradient: 63mm Hg (S). Peak gradient: Hg (S). Valve area: 0.55cm^2(VTI). Valve area: 0.48cm^2 (Vmax). - Left ventricle: The cavity size was normal. Wall thickness was increased in a pattern of moderate LVH. There was moderate concentric hypertrophy, with additional focal basal septal hypertrophy Systolic function was vigorous. The estimated ejection fraction was in the range of 65% to 70%. Wall motion was normal; there were no regional wall motion abnormalities. Doppler parameters are consistent with abnormal left ventricular relaxation (grade 1 diastolic dysfunction). Doppler parameters are consistent with elevated mean left atrial filling pressure. - Mitral valve: Moderately calcified, thickened annulus. Normal thickness leaflets . Mobility of the posterior leaflet was trivially restricted. Mild regurgitation. Valve area by pressure half-time: 2.22cm^2. Valve area by continuity equation (using LVOT flow): 2.36cm^2. - Left atrium: The atrium was mildly to moderately dilated. - Right ventricle: The cavity size was normal. Wall thickness was at the upper limits of normal. - Atrial septum: No defect or patent foramen ovale was identified. - Pulmonary arteries: PA peak pressure: 34mm Hg (S). Impressions:  - Severe to Critical aortic stenosis - with  peak transvalvular velocities of ~500 cm.sec, estimated Valve area of ~0.5 cm2 with peak gradients of ~100-105 mmHg and meangradients of 60-65 mmHg, consistent with an acquired process, with LA enlargement. Transthoracic echocardiography. M-mode, complete 2D, spectral Doppler, and color Doppler. Height: Height: 160cm. Height: 63in. Weight: Weight: 73.5kg. Weight: 161.7lb. Body mass index: BMI: 28.7kg/m^2. Body surface area: BSA: 1.60m^2. Blood pressure: 146/82. Patient status: Outpatient. Location: Echo laboratory.  ------------------------------------------------------------  ------------------------------------------------------------ Left ventricle: The cavity size was normal. Wall thickness was increased in a pattern of moderate LVH. There was moderate concentric hypertrophy, with additional focal basal septal hypertrophy Systolic function was vigorous. The estimated ejection fraction was in the range of 65% to 70%. Wall motion was normal; there were no regional wall motion abnormalities. Doppler parameters are consistent with abnormal left ventricular relaxation (grade 1 diastolic dysfunction). Doppler parameters are consistent with elevated mean left atrial filling pressure.  ------------------------------------------------------------ Aortic valve: Mildly to moderately calcified annulus. Probably trileaflet, but may be functionally bicuspid; mildly increased thickness, moderately calcified leaflets. Moderate focal thickening and calcification involving the right coronary and noncoronary cusp. Cusp separation was reduced. Valve mobility was restricted. Doppler: Transvalvular velocity was increased, due to stenosis. There was critical stenosis. Trivial regurgitation. VTI ratio of LVOT to aortic valve: 0.22. Valve area: 0.55cm^2(VTI). Indexed valve area: 0.31cm^2/m^2 (VTI). Peak velocity ratio of LVOT to aortic valve: 0.19. Valve area: 0.48cm^2 (Vmax). Indexed valve area:  0.27cm^2/m^2 (Vmax). Mean gradient: 63mm Hg (S). Peak gradient: Hg (S).  ------------------------------------------------------------ Aorta: Aortic root: The aortic root was normal in size.  ------------------------------------------------------------ Mitral valve: Moderately calcified, thickened annulus. Normal thickness leaflets . Mobility of the posterior leaflet was trivially restricted. Doppler: Transvalvular velocity was within the normal range. There was no evidence for stenosis. Mild regurgitation. Valve area by pressure half-time: 2.22cm^2. Indexed valve area by pressure half-time: 1.25cm^2/m^2. Valve area by continuity equation (using LVOT flow): 2.36cm^2. Indexed valve area by continuity equation (using LVOT flow): 1.33cm^2/m^2. Mean gradient: 3mm  Hg (D).  ------------------------------------------------------------ Left atrium: LA Volume/ BSA = 31.6 ml/m2 The atrium was mildly to moderately dilated.  ------------------------------------------------------------ Atrial septum: Poorly visualized. The septum was normal. No defect or patent foramen ovale was identified.  ------------------------------------------------------------ Right ventricle: The cavity size was normal. Wall thickness was at the upper limits of normal. The moderator band was prominent. Systolic function was normal.  ------------------------------------------------------------ Pulmonic valve: Poorly visualized. The valve appears to be grossly normal. Doppler: Transvalvular velocity was within the normal range. There was no evidence for stenosis. Trivial regurgitation.  ------------------------------------------------------------ Tricuspid valve: Poorly visualized. The valve appears to be grossly normal. Doppler: Transvalvular velocity was within the normal range. Mild regurgitation.  ------------------------------------------------------------ Pulmonary artery: Poorly visualized. The main  pulmonary artery was normal-sized. Systolic pressure was within the normal range.  ------------------------------------------------------------ Right atrium: The atrium was normal in size.  ------------------------------------------------------------ Pericardium: There was no pericardial effusion.  ------------------------------------------------------------ Systemic veins: Inferior vena cava: The vessel was normal in size; the respirophasic diameter changes were in the normal range (= 50%); findings are consistent with normal central venous pressure. Diameter: 12mm.  ------------------------------------------------------------  2D measurements Normal Doppler measurements Normal IVC Main pulmonary Diam 12 mm ------ artery Left ventricle Pressure, 34 mm Hg =30 LVID ED, 36.28 mm 43-52 S chord, Left ventricle PLAX Ea, lat 4.9 cm/s ------ LVID ES, 33.1 mm 23-38 ann, tiss chord, DP PLAX E/Ea, lat 13.4 ------ FS, 9 % >29 ann, tiss 9 chord, DP PLAX Ea, med 4.5 cm/s ------ LVPW, ED 14.72 mm ------ ann, tiss IVS/LVPW 1.12 <1.3 DP ratio, ED E/Ea, med 14.6 ------ Ventricular septum ann, tiss 9 IVS, ED 16.53 mm ------ DP LVOT LVOT Diam, S 18 mm ------ Peak vel, 95.9 cm/s ------ Area 2.54 cm^2 ------ S Aorta VTI, S 28.2 cm ------ Root 29 mm ------ Aortic valve diam, ED Peak vel, 505 cm/s ------ Left atrium S AP dim 37 mm ------ Mean vel, 370 cm/s ------ AP dim 2.09 cm/m^2 <2.2 S index VTI, S 131 cm ------ Right ventricle Mean 63 mm Hg ------ RVID ED, 20.2 mm 19-38 gradient, PLAX S Peak 102 mm Hg ------ gradient, S VTI ratio 0.22 ------ LVOT/AV Area, VTI 0.55 cm^2 ------ Area index 0.31 cm^2/m ------ (VTI) ^2 Peak vel 0.19 ------ ratio, LVOT/AV Area, Vmax 0.48 cm^2 ------ Area index 0.27 cm^2/m ------ (Vmax) ^2 Regurg PHT 544 ms ------ Mitral valve Peak E vel 66.1 cm/s ------ Peak A vel 123 cm/s ------ Mean vel, 75.8 cm/s ------ D Decelerati 201 ms 150-23 on  time 0 Pressure 99 ms ------ half-time Mean 3 mm Hg ------ gradient, D Peak E/A 0.5 ------ ratio Area (PHT) 2.22 cm^2 ------ Area index 1.25 cm^2/m ------ (PHT) ^2 Area 2.36 cm^2 ------ (LVOT) continuity Area index 1.33 cm^2/m ------ (LVOT ^2 cont) Annulus 30.3 cm ------ VTI Tricuspid valve Regurg 270 cm/s ------ peak vel Peak RV-RA 29 mm Hg ------ gradient, S Systemic veins Estimated 5 mm Hg ------ CVP Right ventricle Pressure, 34 mm Hg <30 S Sa vel, 14.1 cm/s ------ lat ann, tiss DP  ------------------------------------------------------------ Prepared and Electronically Authenticated by  Bryan Lemma 2014-05-20T14:03:[id number]             CARDIAC CATHETERIZATION    History obtained from chart review.The patient is a 79 year old mildly overweight married Caucasian female accompanied by her husband, who is also a patient of mine. She is a mother of 2, grandmother to 1 grandchild. I saw her a year ago after they had celebrated their 60th wedding  anniversary. Her risk factors include hypertension and family history. A brother died at age 69 from heart-related issues while he was being operated on. She has never had a heart attack or stroke. She does have moderate aortic stenosis by 2D echocardiogram last performed a year ago with a valve area of 0.83 cm2, peak gradient of 57, and mean of 35. She had negative Myoview on August 13, 2010. Since I saw her last, she developed exertional jaw pain, which was fairly reproducible. Her last lipid profile a year ago was excellent with total cholesterol 166, LDL 89, HDL 44. 2-D echo was performed showed critical aortic stenosis the valve area 0.5 cm. Based on this I believe the patient needs a right left heart catheter for all 4 aortic valve replacement with a thoracic surgeon. I think she is acceptable risk for surgical valve replacement and probably doesn't meet criteria for TAVR.   PROCEDURE DESCRIPTION:   The patient was  brought to the second floor [facility] cath lab in the postabsorptive state. She was premedicated with Valium 5 mg by mouth. Her right groin   was prepped and shaved in usual sterile fashion. Xylocaine 1% was used for local anesthesia. A 5 French sheath was inserted into the right common femoral   artery using standard Seldinger technique. A 7 French sheath was inserted into the right common femoral vein using standard Seldinger technique. A 7 French balloon tip thermal dilution Swan-Ganz catheter was advanced right heart chambers obtaining pressures and blood samples to obtain Fick and thermodilution cardiac outputs. 5 French right and left Judkins diagnostic catheters along with a 5 [place] AL-1 catheter were used for selective coronary angiography pertaining pullback pressures. Left ventriculography was not performed to conserve contrast and because a 2-D echo already been done. Visipaque dye was used for the entirety of the case (30 cc administered to the patient), retrograde aortic, left ventricular end pullback pressures were recorded.   HEMODYNAMICS:  AO SYSTOLIC/AO DIASTOLIC: 168/69   LV SYSTOLIC/LV DIASTOLIC: 231/20 (end-diastolic pressure 30)   Right atrial pressure: 14/12   Right ventricular pressure: 34/13   Pulmonary artery pressure: 34/16, mean 27   Pulmonary capillary wedge pressure: 22/21, mean 18   Cardiac output 4.1 L per minute (Fick), cardiac index 2.3 L per minute per meter squared   Aortic gradient : 63 mmHg peak to peak   Aortic valve area: 0.37 cm   ANGIOGRAPHIC RESULTS:  1. Left main; normal   2. LAD; normal   3. Left circumflex; normal.   4. Right coronary artery; dominant and normal   5. Left ventriculography; not performed today   IMPRESSION:Ms. Noh has normal coronary arteries and critical aortic stenosis. Her valve area is 0.37 cm. I believe she is a good candidate for aortic valve replacement with a bioprosthesis. The sheath were removed and pressure  was held on the groin to achieve hemostasis. The patient left the Cath Lab in stable condition.[facility] has been notified for surgical consultation. The patient will remain in the hospital overnight for observation and will be discharged home in the morning.   Runell Gess MD, [facility]   08/14/2012   9:43 AM        Impression:  The patient has severe symptomatic aortic stenosis with normal left ventricular systolic function. Risks associated with surgery will be slightly elevated because of the patient's advanced age, but overall she remains well preserved for her age, functionally active, and physically independent. I agree that she would best be  treated with conventional surgical aortic valve replacemen  Plan:  The patient was counseled at length regarding surgical alternatives with respect to valve replacement including continued medical therapy versus proceeding with conventional surgical aortic valve replacement using either a mechanical prosthesis or a bioprosthetic tissue valve.  Other alternatives including transcatheter aortic valve replacement were discussed.  Discussion was held comparing the relative risks of mechanical valve replacement with need for lifelong anticoagulation versus use of a bioprosthetic tissue valve and the associated potential for late structural valve deterioration in failure.  This discussion was placed in the context of the patient's particular circumstances, and as a result the patient specifically requests that their valve be replaced using a bioprosthetic tissue valve .  They understand and accept all potential associated risks of surgery including but not limited to risk of death, stroke, myocardial infarction, congestive heart failure, respiratory failure, renal failure, pneumonia, bleeding requiring blood transfusion and or reexploration, arrhythmia, heart block or bradycardia requiring permanent pacemaker, pleural effusions or other delayed complications related  to continued congestive heart failure, or other late complications related to valve replacement.  The patient was originally seen in consultation on 08/14/2012 following her catheterization.  She now returns with plans to proceed with elective aortic valve replacement using a bioprosthetic tissue valve.  Over the past 2 weeks the patient has remained clinically stable with no new problems or complaints.      Salvatore Decent. Cornelius Moras, MD 08/30/2012 8:15 AM

## 2012-08-30 NOTE — Anesthesia Preprocedure Evaluation (Signed)
Anesthesia Evaluation  Patient identified by MRN, date of birth, ID band Patient awake    Reviewed: Allergy & Precautions, H&P , NPO status , Patient's Chart, lab work & pertinent test results  Airway Mallampati: II  Neck ROM: full    Dental   Pulmonary COPD         Cardiovascular hypertension, + Peripheral Vascular Disease + Valvular Problems/Murmurs AS     Neuro/Psych    GI/Hepatic GERD-  ,  Endo/Other    Renal/GU      Musculoskeletal  (+) Arthritis -,   Abdominal   Peds  Hematology   Anesthesia Other Findings   Reproductive/Obstetrics                           Anesthesia Physical Anesthesia Plan  ASA: III  Anesthesia Plan: General   Post-op Pain Management:    Induction: Intravenous  Airway Management Planned: Oral ETT  Additional Equipment: Arterial line, CVP, PA Cath, TEE and Ultrasound Guidance Line Placement  Intra-op Plan:   Post-operative Plan: Post-operative intubation/ventilation  Informed Consent: I have reviewed the patients History and Physical, chart, labs and discussed the procedure including the risks, benefits and alternatives for the proposed anesthesia with the patient or authorized representative who has indicated his/her understanding and acceptance.     Plan Discussed with: CRNA, Anesthesiologist and Surgeon  Anesthesia Plan Comments:         Anesthesia Quick Evaluation

## 2012-08-31 ENCOUNTER — Inpatient Hospital Stay (HOSPITAL_COMMUNITY): Payer: Medicare Other

## 2012-08-31 ENCOUNTER — Encounter (HOSPITAL_COMMUNITY): Payer: Self-pay | Admitting: Thoracic Surgery (Cardiothoracic Vascular Surgery)

## 2012-08-31 DIAGNOSIS — J449 Chronic obstructive pulmonary disease, unspecified: Secondary | ICD-10-CM

## 2012-08-31 DIAGNOSIS — R0989 Other specified symptoms and signs involving the circulatory and respiratory systems: Secondary | ICD-10-CM

## 2012-08-31 DIAGNOSIS — Z952 Presence of prosthetic heart valve: Secondary | ICD-10-CM

## 2012-08-31 DIAGNOSIS — I359 Nonrheumatic aortic valve disorder, unspecified: Principal | ICD-10-CM

## 2012-08-31 LAB — BASIC METABOLIC PANEL
BUN: 10 mg/dL (ref 6–23)
CO2: 23 mEq/L (ref 19–32)
Calcium: 7.7 mg/dL — ABNORMAL LOW (ref 8.4–10.5)
Chloride: 108 mEq/L (ref 96–112)
Creatinine, Ser: 0.79 mg/dL (ref 0.50–1.10)
GFR calc Af Amer: 89 mL/min — ABNORMAL LOW (ref >90)
GFR calc non Af Amer: 77 mL/min — ABNORMAL LOW (ref >90)
Glucose, Bld: 141 mg/dL — ABNORMAL HIGH (ref 70–99)
Potassium: 3.8 mEq/L (ref 3.5–5.1)
Sodium: 137 mEq/L (ref 135–145)

## 2012-08-31 LAB — GLUCOSE, CAPILLARY
Glucose-Capillary: 101 mg/dL — ABNORMAL HIGH (ref 70–99)
Glucose-Capillary: 109 mg/dL — ABNORMAL HIGH (ref 70–99)
Glucose-Capillary: 120 mg/dL — ABNORMAL HIGH (ref 70–99)
Glucose-Capillary: 128 mg/dL — ABNORMAL HIGH (ref 70–99)
Glucose-Capillary: 139 mg/dL — ABNORMAL HIGH (ref 70–99)
Glucose-Capillary: 139 mg/dL — ABNORMAL HIGH (ref 70–99)
Glucose-Capillary: 151 mg/dL — ABNORMAL HIGH (ref 70–99)
Glucose-Capillary: 161 mg/dL — ABNORMAL HIGH (ref 70–99)
Glucose-Capillary: 181 mg/dL — ABNORMAL HIGH (ref 70–99)
Glucose-Capillary: 78 mg/dL (ref 70–99)
Glucose-Capillary: 79 mg/dL (ref 70–99)
Glucose-Capillary: 90 mg/dL (ref 70–99)

## 2012-08-31 LAB — POCT I-STAT 3, ART BLOOD GAS (G3+)
Acid-base deficit: 3 mmol/L — ABNORMAL HIGH (ref 0.0–2.0)
Acid-base deficit: 4 mmol/L — ABNORMAL HIGH (ref 0.0–2.0)
Bicarbonate: 21.9 mEq/L (ref 20.0–24.0)
Bicarbonate: 23.1 mEq/L (ref 20.0–24.0)
O2 Saturation: 98 %
O2 Saturation: 99 %
Patient temperature: 36.6
Patient temperature: 36.7
TCO2: 23 mmol/L (ref 0–100)
TCO2: 24 mmol/L (ref 0–100)
pCO2 arterial: 41.2 mmHg (ref 35.0–45.0)
pCO2 arterial: 41.8 mmHg (ref 35.0–45.0)
pH, Arterial: 7.331 — ABNORMAL LOW (ref 7.350–7.450)
pH, Arterial: 7.35 (ref 7.350–7.450)
pO2, Arterial: 111 mmHg — ABNORMAL HIGH (ref 80.0–100.0)
pO2, Arterial: 124 mmHg — ABNORMAL HIGH (ref 80.0–100.0)

## 2012-08-31 LAB — CBC
HCT: 32.6 % — ABNORMAL LOW (ref 36.0–46.0)
HCT: 37.3 % (ref 36.0–46.0)
Hemoglobin: 11.5 g/dL — ABNORMAL LOW (ref 12.0–15.0)
Hemoglobin: 12.7 g/dL (ref 12.0–15.0)
MCH: 31.3 pg (ref 26.0–34.0)
MCH: 30.9 pg (ref 26.0–34.0)
MCHC: 35.3 g/dL (ref 30.0–36.0)
MCHC: 34 g/dL (ref 30.0–36.0)
MCV: 88.6 fL (ref 78.0–100.0)
MCV: 90.8 fL (ref 78.0–100.0)
Platelets: 101 10*3/uL — ABNORMAL LOW (ref 150–400)
Platelets: 113 10*3/uL — ABNORMAL LOW (ref 150–400)
RBC: 3.68 MIL/uL — ABNORMAL LOW (ref 3.87–5.11)
RBC: 4.11 MIL/uL (ref 3.87–5.11)
RDW: 13.4 % (ref 11.5–15.5)
RDW: 13.9 % (ref 11.5–15.5)
WBC: 15.7 10*3/uL — ABNORMAL HIGH (ref 4.0–10.5)
WBC: 16.3 10*3/uL — ABNORMAL HIGH (ref 4.0–10.5)

## 2012-08-31 LAB — CREATININE, SERUM
Creatinine, Ser: 0.95 mg/dL (ref 0.50–1.10)
GFR calc Af Amer: 64 mL/min — ABNORMAL LOW (ref >90)
GFR calc non Af Amer: 55 mL/min — ABNORMAL LOW (ref >90)

## 2012-08-31 LAB — POCT I-STAT, CHEM 8
BUN: 9 mg/dL (ref 6–23)
BUN: 14 mg/dL (ref 6–23)
Calcium, Ion: 1.12 mmol/L — ABNORMAL LOW (ref 1.13–1.30)
Calcium, Ion: 1.09 mmol/L — ABNORMAL LOW (ref 1.13–1.30)
Chloride: 105 mEq/L (ref 96–112)
Chloride: 103 mEq/L (ref 96–112)
Creatinine, Ser: 0.8 mg/dL (ref 0.50–1.10)
Creatinine, Ser: 0.9 mg/dL (ref 0.50–1.10)
Glucose, Bld: 148 mg/dL — ABNORMAL HIGH (ref 70–99)
Glucose, Bld: 126 mg/dL — ABNORMAL HIGH (ref 70–99)
HCT: 33 % — ABNORMAL LOW (ref 36.0–46.0)
HCT: 39 % (ref 36.0–46.0)
Hemoglobin: 11.2 g/dL — ABNORMAL LOW (ref 12.0–15.0)
Hemoglobin: 13.3 g/dL (ref 12.0–15.0)
Potassium: 3.7 mEq/L (ref 3.5–5.1)
Potassium: 4 mEq/L (ref 3.5–5.1)
Sodium: 138 mEq/L (ref 135–145)
Sodium: 138 mEq/L (ref 135–145)
TCO2: 22 mmol/L (ref 0–100)
TCO2: 22 mmol/L (ref 0–100)

## 2012-08-31 LAB — MAGNESIUM
Magnesium: 2.7 mg/dL — ABNORMAL HIGH (ref 1.5–2.5)
Magnesium: 2.5 mg/dL (ref 1.5–2.5)

## 2012-08-31 MED ORDER — INSULIN ASPART 100 UNIT/ML ~~LOC~~ SOLN
0.0000 [IU] | SUBCUTANEOUS | Status: DC
  Administered 2012-08-31 (×2): 2 [IU] via SUBCUTANEOUS

## 2012-08-31 MED ORDER — DIPHENHYDRAMINE HCL 25 MG PO CAPS
25.0000 mg | ORAL_CAPSULE | Freq: Three times a day (TID) | ORAL | Status: DC | PRN
  Administered 2012-08-31: 25 mg via ORAL
  Filled 2012-08-31 (×2): qty 1

## 2012-08-31 MED ORDER — HYDROXYZINE HCL 25 MG PO TABS
25.0000 mg | ORAL_TABLET | Freq: Four times a day (QID) | ORAL | Status: DC | PRN
  Administered 2012-08-31: 25 mg via ORAL
  Filled 2012-08-31: qty 1

## 2012-08-31 MED ORDER — FUROSEMIDE 10 MG/ML IJ SOLN
20.0000 mg | Freq: Two times a day (BID) | INTRAMUSCULAR | Status: DC
  Administered 2012-08-31: 20 mg via INTRAVENOUS
  Filled 2012-08-31 (×2): qty 2

## 2012-08-31 MED ORDER — NYSTATIN 100000 UNIT/GM EX POWD
Freq: Two times a day (BID) | CUTANEOUS | Status: DC
  Administered 2012-08-31 – 2012-09-08 (×15): via TOPICAL
  Filled 2012-08-31 (×2): qty 15

## 2012-08-31 MED ORDER — DOPAMINE-DEXTROSE 3.2-5 MG/ML-% IV SOLN: 3.0000 ug/kg/min | INTRAVENOUS | Status: DC

## 2012-08-31 MED ORDER — DOPAMINE-DEXTROSE 3.2-5 MG/ML-% IV SOLN
INTRAVENOUS | Status: AC
  Filled 2012-08-31: qty 250

## 2012-08-31 MED ORDER — POTASSIUM CHLORIDE CRYS ER 20 MEQ PO TBCR
40.0000 meq | EXTENDED_RELEASE_TABLET | Freq: Once | ORAL | Status: AC
  Administered 2012-08-31: 40 meq via ORAL
  Filled 2012-08-31: qty 2

## 2012-08-31 MED FILL — Heparin Sodium (Porcine) Inj 1000 Unit/ML: INTRAMUSCULAR | Qty: 10 | Status: AC

## 2012-08-31 MED FILL — Magnesium Sulfate Inj 50%: INTRAMUSCULAR | Qty: 10 | Status: AC

## 2012-08-31 MED FILL — Sodium Chloride IV Soln 0.9%: INTRAVENOUS | Qty: 1000 | Status: AC

## 2012-08-31 MED FILL — Mannitol IV Soln 20%: INTRAVENOUS | Qty: 500 | Status: AC

## 2012-08-31 MED FILL — Electrolyte-R (PH 7.4) Solution: INTRAVENOUS | Qty: 3000 | Status: AC

## 2012-08-31 MED FILL — Sodium Chloride Irrigation Soln 0.9%: Qty: 3000 | Status: AC

## 2012-08-31 MED FILL — Lidocaine HCl IV Inj 20 MG/ML: INTRAVENOUS | Qty: 5 | Status: AC

## 2012-08-31 MED FILL — Potassium Chloride Inj 2 mEq/ML: INTRAVENOUS | Qty: 40 | Status: AC

## 2012-08-31 MED FILL — Sodium Bicarbonate IV Soln 8.4%: INTRAVENOUS | Qty: 50 | Status: AC

## 2012-08-31 MED FILL — Heparin Sodium (Porcine) Inj 1000 Unit/ML: INTRAMUSCULAR | Qty: 30 | Status: AC

## 2012-08-31 NOTE — Progress Notes (Addendum)
Pt. Seen and examined. Agree with the NP/PA-C note as written.  Nausea persists. She is anxious. She reports a rash over her left arm and in her groins, which are pruritic. No chest pain. Vitals stable. Agree with diuresis and low dose dopamine. No benefit from benadryl, will change to atarax. Apply topical nystatin powder to intertriginous areas.   Chrystie Nose, MD, [facility] Attending Cardiologist [facility]

## 2012-08-31 NOTE — Procedures (Signed)
Extubation Procedure Note  Patient Details:   Name: [name] DOB: [birth year] MRN: [id number]   Airway Documentation:   Patient extubated to 4 lpm nasal cannula.  VC 700 ml, NIF -30, patient able to hold head off bed.  Patient able to breathe around deflated cuff and vocalize post procedure.  Tolerated well, no complications noted.   Evaluation  O2 sats: stable throughout Complications: No apparent complications Patient did tolerate procedure well. Bilateral Breath Sounds: Clear   Yes  Annick Dimaio, Aloha Gell 08/31/2012, 12:42 AM

## 2012-08-31 NOTE — Progress Notes (Signed)
[facility]  Subjective: Only complaint is n/v. She just received Zofran and is now feeling better.   Objective: Vital signs in last 24 hours: Temp:  [94.3 F (34.6 C)-98.2 F (36.8 C)] 97.5 F (36.4 C) (07/03 1230) Pulse Rate:  [59-92] 71 (07/03 1230) Resp:  [12-23] 16 (07/03 1230) BP: (84-114)/(47-66) 105/51 mmHg (07/03 1230) SpO2:  [96 %-100 %] 100 % (07/03 1230) Arterial Line BP: (93-156)/(48-82) 121/57 mmHg (07/03 1230) FiO2 (%):  [40 %-50 %] 40 % (07/03 0000) Weight:  [168 lb 14 oz (76.6 kg)] 168 lb 14 oz (76.6 kg) (07/03 0500)    Intake/Output from previous day: 07/02 0701 - 07/03 0700 In: 4696.1 [I.V.:2827.1; Blood:469; NG/GT:60; IV Piggyback:1340] Out: 5635 [Urine:4210; Emesis/NG output:75; Blood:1050; Chest Tube:300] Intake/Output this shift: Total I/O In: 263 [I.V.:213; IV Piggyback:50] Out: 490 [Urine:410; Chest Tube:80]  Medications Current Facility-Administered Medications  Medication Dose Route Frequency Provider Last Rate Last Dose  . 0.45 % sodium chloride infusion   Intravenous Continuous Purcell Nails, MD 20 mL/hr at 08/30/12 1300 20 mL/hr at 08/30/12 1300  . 0.9 %  sodium chloride infusion   Intravenous Continuous Purcell Nails, MD 20 mL/hr at 08/30/12 1400 20 mL/hr at 08/30/12 1400  . 0.9 %  sodium chloride infusion  250 mL Intravenous Continuous Purcell Nails, MD      . acetaminophen (TYLENOL) tablet 1,000 mg  1,000 mg Oral Q6H Purcell Nails, MD   1,000 mg at 08/31/12 1109   Or  . acetaminophen (TYLENOL) solution 975 mg  975 mg Per Tube Q6H Purcell Nails, MD      . aspirin EC tablet 325 mg  325 mg Oral Daily Purcell Nails, MD   325 mg at 08/31/12 9562   Or  . aspirin chewable tablet 324 mg  324 mg Per Tube Daily Purcell Nails, MD      . bisacodyl (DULCOLAX) EC tablet 10 mg  10 mg Oral Daily Purcell Nails, MD   10 mg at 08/31/12 1000   Or  . bisacodyl (DULCOLAX) suppository 10 mg  10 mg Rectal Daily  Purcell Nails, MD      . cefUROXime (ZINACEF) 1.5 g in dextrose 5 % 50 mL IVPB  1.5 g Intravenous Q12H Purcell Nails, MD   1.5 g at 08/31/12 1100  . dexmedetomidine (PRECEDEX) 200 MCG/50ML infusion  0.1-0.7 mcg/kg/hr Intravenous Continuous Purcell Nails, MD   0.1 mcg/kg/hr at 08/30/12 1600  . diphenhydrAMINE (BENADRYL) capsule 25 mg  25 mg Oral Q8H PRN Ardelle Balls, PA-C   25 mg at 08/31/12 0901  . docusate sodium (COLACE) capsule 200 mg  200 mg Oral Daily Purcell Nails, MD   200 mg at 08/31/12 1000  . fesoterodine (TOVIAZ) tablet 8 mg  8 mg Oral Daily Purcell Nails, MD   8 mg at 08/31/12 0959  . furosemide (LASIX) injection 20 mg  20 mg Intravenous BID Ardelle Balls, PA-C   20 mg at 08/31/12 0859  . insulin aspart (novoLOG) injection 0-24 Units  0-24 Units Subcutaneous Q4H Donielle M Zimmerman, PA-C      . insulin regular (NOVOLIN R,HUMULIN R) 1 Units/mL in sodium chloride 0.9 % 100 mL infusion   Intravenous Continuous Purcell Nails, MD 2.4 mL/hr at 08/31/12 0500    . insulin regular bolus via infusion 0-10 Units  0-10 Units Intravenous TID WC Purcell Nails, MD      .  lactated ringers infusion   Intravenous Continuous Purcell Nails, MD 20 mL/hr at 08/30/12 1621 20 mL/hr at 08/30/12 1621  . metoprolol (LOPRESSOR) injection 2.5-5 mg  2.5-5 mg Intravenous Q2H PRN Purcell Nails, MD      . metoprolol tartrate (LOPRESSOR) tablet 12.5 mg  12.5 mg Oral BID Purcell Nails, MD   12.5 mg at 08/31/12 4540   Or  . metoprolol tartrate (LOPRESSOR) 25 mg/10 mL oral suspension 12.5 mg  12.5 mg Per Tube BID Purcell Nails, MD      . midazolam (VERSED) injection 2 mg  2 mg Intravenous Q1H PRN Purcell Nails, MD      . morphine 2 MG/ML injection 2-5 mg  2-5 mg Intravenous Q1H PRN Purcell Nails, MD   2 mg at 08/31/12 0859  . mupirocin ointment (BACTROBAN) 2 % 1 application  1 application Nasal BID Purcell Nails, MD   1 application at 08/31/12 1001  . nitroGLYCERIN 0.2 mg/mL in  dextrose 5 % infusion  0-100 mcg/min Intravenous Continuous Purcell Nails, MD   5 mcg/min at 08/31/12 0100  . ondansetron (ZOFRAN) injection 4 mg  4 mg Intravenous Q6H PRN Purcell Nails, MD   4 mg at 08/30/12 2330  . oxyCODONE (Oxy IR/ROXICODONE) immediate release tablet 5-10 mg  5-10 mg Oral Q3H PRN Purcell Nails, MD      . Melene Muller ON 09/01/2012] pantoprazole (PROTONIX) EC tablet 40 mg  40 mg Oral Daily Purcell Nails, MD      . phenylephrine (NEO-SYNEPHRINE) 20,000 mcg in dextrose 5 % 250 mL infusion  0-100 mcg/min Intravenous Continuous Purcell Nails, MD      . potassium chloride SA (K-DUR,KLOR-CON) CR tablet 40 mEq  40 mEq Oral Once Donielle M Zimmerman, PA-C      . sodium chloride 0.9 % injection 3 mL  3 mL Intravenous Q12H Purcell Nails, MD   3 mL at 08/31/12 1001  . sodium chloride 0.9 % injection 3 mL  3 mL Intravenous PRN Purcell Nails, MD        PE: General appearance: alert, cooperative and no distress Lungs: decreased breath sounds at bilateral bases Heart: regular rate and rhythm Extremities: no LEE Pulses: 2+ and symmetric Skin: warm and dry Neurologic: Grossly normal  Lab Results:   Recent Labs  08/30/12 1318 08/30/12 1930 08/30/12 2007 08/31/12 0400  WBC 15.4* 16.2*  --  15.7*  HGB 12.0 11.8* 11.2* 11.5*  HCT 33.7* 34.0* 33.0* 32.6*  PLT 108* 111*  --  101*   BMET  Recent Labs  08/28/12 1525  08/30/12 1311 08/30/12 1930 08/30/12 2007 08/31/12 0400  NA 135  < > 137  --  138 137  K 3.9  < > 3.3*  --  3.7 3.8  CL 98  --   --   --  105 108  CO2 23  --   --   --   --  23  GLUCOSE 92  < > 82  --  148* 141*  BUN 11  --   --   --  9 10  CREATININE 0.84  --   --  0.81 0.80 0.79  CALCIUM 9.8  --   --   --   --  7.7*  < > = values in this interval not displayed. PT/INR  Recent Labs  08/28/12 1525 08/30/12 1318  LABPROT 12.5 17.4*  INR 0.95 1.47    Assessment/Plan  Principal Problem:   S/P aortic valve replacement with bioprosthetic  valve  Plan: S/P AVR for critical aortic stenosis. POD#1. She is now off Dopamine. BP is stable. She is now tachycardia, but has had some recent n/v, which is starting to improved after recieving Zofran. She is in NSR. Rate is currently in the 120s. She has had no post-operative arrhthymias. Continue on BID BB. Continue on Lasix for diuresis. Will continue to follow along.     LOS: 1 day    Leory Allinson M. Sharol Harness, PA-C 08/31/2012 1:05 PM

## 2012-08-31 NOTE — Progress Notes (Signed)
Patient ID: [name], female   DOB: 02-09-1932, 79 y.o.   MRN: [id number]   SICU Evening Rounds:  Weaned off dopamine this am and dropped BP so dopamine restarted. She is 103/49 now on 3 mcg.  Urine output adequate.  Will hold off on further diuresis and B-Blocker until stable off dopamine.

## 2012-08-31 NOTE — Progress Notes (Addendum)
[address]       [name] [address]             [phone]        1 Day Post-Op Procedure(s) (LRB): AORTIC VALVE REPLACEMENT (AVR) (N/A) INTRAOPERATIVE TRANSESOPHAGEAL ECHOCARDIOGRAM (N/A)  Subjective: Patient extubated after midnight. A little "sleepy" this am. Her only concern is a rash.  Objective: Vital signs in last 24 hours: Temp:  [94.3 F (34.6 C)-98.2 F (36.8 C)] 97.5 F (36.4 C) (07/03 0645) Pulse Rate:  [59-92] 91 (07/03 0645) Cardiac Rhythm:  [-] Ventricular paced;Normal sinus rhythm (07/02 2000) Resp:  [12-23] 16 (07/03 0645) BP: (84-108)/(47-71) 84/55 mmHg (07/03 0300) SpO2:  [96 %-100 %] 99 % (07/03 0645) Arterial Line BP: (93-156)/(48-82) 124/60 mmHg (07/03 0645) FiO2 (%):  [40 %-50 %] 40 % (07/03 0000) Weight:  [76.6 kg (168 lb 14 oz)] 76.6 kg (168 lb 14 oz) (07/03 0500)  Pre op weight 73 kg Current Weight  08/31/12 76.6 kg (168 lb 14 oz)    Hemodynamic parameters for last 24 hours: PAP: (16-43)/(9-29) 39/22 mmHg CO:  [2.1 L/min-4.7 L/min] 4.4 L/min CI:  [1.2 L/min/m2-2.7 L/min/m2] 2.5 L/min/m2  Intake/Output from previous day: 07/02 0701 - 07/03 0700 In: 4649 [I.V.:2780; Blood:469; NG/GT:60; IV Piggyback:1340] Out: 5560 [Urine:4135; Emesis/NG output:75; Blood:1050; Chest Tube:300]   Physical Exam:  Cardiovascular: RRR, no murmurs, gallops. Pulmonary: Diminished at bases bilaterally; no rales, wheezes, or rhonchi. Abdomen: Soft, non tender, bowel sounds present. Extremities: SCDs in place Wounds: Dressing is clean and dry.   Neurologic: Grossly intact without focal deficits  Lab Results: CBC: Recent Labs  08/30/12 1930 08/30/12 2007 08/31/12 0400  WBC 16.2*  --  15.7*  HGB 11.8* 11.2* 11.5*  HCT 34.0* 33.0* 32.6*  PLT 111*  --  101*   BMET:  Recent Labs  08/28/12 1525  08/30/12 2007 08/31/12 0400  NA 135  < > 138 137  K 3.9  < > 3.7 3.8  CL 98  --  105 108  CO2 23  --   --  23  GLUCOSE 92  < >  148* 141*  BUN 11  --  9 10  CREATININE 0.84  < > 0.80 0.79  CALCIUM 9.8  --   --  7.7*  < > = values in this interval not displayed.  PT/INR:  Lab Results  Component Value Date   INR 1.47 08/30/2012   INR 0.95 08/28/2012   INR 0.90 08/08/2012   ABG:  INR: Will add last result for INR, ABG once components are confirmed Will add last 4 CBG results once components are confirmed  Assessment/Plan:  1. CV - A paced at 90 . ECG this am shows SR with 1st degree AV block.On Dopamine drip (CI previously low) and Lopressor 12.5 bid.  2.  Pulmonary - Chest tubes with 300 cc of output since surgery. Likely remove later this am.CXR this am appears to show patient rotated to the left, small bilateral pleural effusions, bibasilar atelectasis, no pneumothorax, and cardiomegaly. 3. Volume Overload - Begin gentle diuresis 4.  Acute blood loss anemia - H and H stable at 11.5 and 32.6 5.Thrombocytopenia-platelets 101,000 6.Supplement potassium 7.CBGs 181/139. Pre op HGA1C 5.5. Will continue acc checks until transferred 8.Rash mostly on both hands, appears to be starting on right anterior chest. Patient states this has happened several times before and she was given "a shot". Will give Benadryl. Questionable etiology  ZIMMERMAN,DONIELLE MPA-C 08/31/2012,7:25 AM  Rash mild on upper rt chest and arms , may be antibiotic  related, to get benadryl Wean dopamine off Off vent and neuro intact D/c insulin drip for low glucose I have seen and examined Luna Kitchens and agree with the above assessment  and plan.  Delight Ovens MD Beeper [phone] Office [phone] 08/31/2012 8:27 AM

## 2012-09-01 ENCOUNTER — Inpatient Hospital Stay (HOSPITAL_COMMUNITY): Payer: Medicare Other

## 2012-09-01 DIAGNOSIS — R079 Chest pain, unspecified: Secondary | ICD-10-CM

## 2012-09-01 DIAGNOSIS — Z0389 Encounter for observation for other suspected diseases and conditions ruled out: Secondary | ICD-10-CM

## 2012-09-01 LAB — TYPE AND SCREEN
ABO/RH(D): O NEG
Antibody Screen: NEGATIVE
Unit division: 0
Unit division: 0
Unit division: 0
Unit division: 0
Unit division: 0
Unit division: 0

## 2012-09-01 LAB — BASIC METABOLIC PANEL
BUN: 15 mg/dL (ref 6–23)
CO2: 24 mEq/L (ref 19–32)
Calcium: 7.5 mg/dL — ABNORMAL LOW (ref 8.4–10.5)
Chloride: 104 mEq/L (ref 96–112)
Creatinine, Ser: 0.85 mg/dL (ref 0.50–1.10)
GFR calc Af Amer: 74 mL/min — ABNORMAL LOW (ref >90)
GFR calc non Af Amer: 63 mL/min — ABNORMAL LOW (ref >90)
Glucose, Bld: 111 mg/dL — ABNORMAL HIGH (ref 70–99)
Potassium: 4.3 mEq/L (ref 3.5–5.1)
Sodium: 135 mEq/L (ref 135–145)

## 2012-09-01 LAB — GLUCOSE, CAPILLARY
Glucose-Capillary: 79 mg/dL (ref 70–99)
Glucose-Capillary: 96 mg/dL (ref 70–99)
Glucose-Capillary: 128 mg/dL — ABNORMAL HIGH (ref 70–99)

## 2012-09-01 LAB — CBC
HCT: 32.2 % — ABNORMAL LOW (ref 36.0–46.0)
Hemoglobin: 11.3 g/dL — ABNORMAL LOW (ref 12.0–15.0)
MCH: 31.7 pg (ref 26.0–34.0)
MCHC: 35.1 g/dL (ref 30.0–36.0)
MCV: 90.2 fL (ref 78.0–100.0)
Platelets: 89 10*3/uL — ABNORMAL LOW (ref 150–400)
RBC: 3.57 MIL/uL — ABNORMAL LOW (ref 3.87–5.11)
RDW: 14 % (ref 11.5–15.5)
WBC: 12.3 10*3/uL — ABNORMAL HIGH (ref 4.0–10.5)

## 2012-09-01 MED ORDER — AMIODARONE HCL IN DEXTROSE 360-4.14 MG/200ML-% IV SOLN
60.0000 mg/h | INTRAVENOUS | Status: AC
  Administered 2012-09-01: 60 mg/h via INTRAVENOUS
  Filled 2012-09-01: qty 200

## 2012-09-01 MED ORDER — AMIODARONE LOAD VIA INFUSION
150.0000 mg | Freq: Once | INTRAVENOUS | Status: AC
  Administered 2012-09-01: 150 mg via INTRAVENOUS
  Filled 2012-09-01: qty 83.34

## 2012-09-01 MED ORDER — AMIODARONE HCL IN DEXTROSE 360-4.14 MG/200ML-% IV SOLN
INTRAVENOUS | Status: AC
  Filled 2012-09-01: qty 200

## 2012-09-01 MED ORDER — AMIODARONE HCL IN DEXTROSE 360-4.14 MG/200ML-% IV SOLN
30.0000 mg/h | INTRAVENOUS | Status: DC
  Administered 2012-09-02 – 2012-09-03 (×3): 30 mg/h via INTRAVENOUS
  Filled 2012-09-01 (×8): qty 200

## 2012-09-01 NOTE — Progress Notes (Signed)
Dr. Laneta Simmers contacted and advised that the pt had been having frequent PACS and was now in afib in the one teens to 120s.  Amio protocol with bolus was ordered and initiated.  MAPS are currently remaining above 60.

## 2012-09-01 NOTE — Progress Notes (Signed)
2 Days Post-Op Procedure(s) (LRB): AORTIC VALVE REPLACEMENT (AVR) (N/A) INTRAOPERATIVE TRANSESOPHAGEAL ECHOCARDIOGRAM (N/A) Subjective: Only complaint is of some chronic back pain  Objective: Vital signs in last 24 hours: Temp:  [97.2 F (36.2 C)-98.2 F (36.8 C)] 97.8 F (36.6 C) (07/04 0730) Pulse Rate:  [63-126] 87 (07/04 0930) Cardiac Rhythm:  [-] Normal sinus rhythm (07/04 0900) Resp:  [11-24] 21 (07/04 0930) BP: (73-155)/(40-61) 110/49 mmHg (07/04 0930) SpO2:  [92 %-100 %] 98 % (07/04 0930) Arterial Line BP: (112-168)/(53-77) 168/77 mmHg (07/03 1300) Weight:  [76.6 kg (168 lb 14 oz)] 76.6 kg (168 lb 14 oz) (07/04 0500)  Hemodynamic parameters for last 24 hours: PAP: (34-51)/(17-31) 51/31 mmHg  Intake/Output from previous day: 07/03 0701 - 07/04 0700 In: 908.2 [P.O.:120; I.V.:688.2; IV Piggyback:100] Out: 975 [Urine:895; Chest Tube:80] Intake/Output this shift: Total I/O In: 45.1 [I.V.:45.1] Out: 35 [Urine:35]  General appearance: alert and cooperative Neurologic: intact Heart: regular rate and rhythm, S1, S2 normal, no murmur, click, rub or gallop Lungs: clear to auscultation bilaterally Extremities: edema mild Wound: dressing dry.  Lab Results:  Recent Labs  08/31/12 1655 09/01/12 0415  WBC 16.3* 12.3*  HGB 12.7 11.3*  HCT 37.3 32.2*  PLT 113* 89*   BMET:  Recent Labs  08/31/12 0400 08/31/12 1653 08/31/12 1655 09/01/12 0415  NA 137 138  --  135  K 3.8 4.0  --  4.3  CL 108 103  --  104  CO2 23  --   --  24  GLUCOSE 141* 126*  --  111*  BUN 10 14  --  15  CREATININE 0.79 0.90 0.95 0.85  CALCIUM 7.7*  --   --  7.5*    PT/INR:  Recent Labs  08/30/12 1318  LABPROT 17.4*  INR 1.47   ABG    Component Value Date/Time   PHART 7.350 08/31/2012 0125   HCO3 23.1 08/31/2012 0125   TCO2 22 08/31/2012 1653   ACIDBASEDEF 3.0* 08/31/2012 0125   O2SAT 99.0 08/31/2012 0125   CBG (last 3)   Recent Labs  08/31/12 2316 09/01/12 0356 09/01/12 0734   GLUCAP 79 79 96   Study Result    *RADIOLOGY REPORT*  Clinical Data: Postop  PORTABLE CHEST - 1 VIEW  Comparison: 08/31/2012  Findings: Interval removal of left chest tube, mediastinal drain,  and right IJ Swan-Ganz catheter.  Right IJ venous sheath.  Possible tiny right apical pneumothorax.  Cardiomegaly with mild interstitial edema and small bilateral  pleural effusions. Patchy right lower lobe opacity, likely  atelectasis.  Prosthetic valve.  IMPRESSION:  Possible tiny right apical pneumothorax.  Cardiomegaly with mild interstitial edema and small bilateral  pleural effusions.  Patchy right lower lobe opacity, likely atelectasis.  Original Report Authenticated By: Charline Bills, M.D.     Assessment/Plan: S/P Procedure(s) (LRB): AORTIC VALVE REPLACEMENT (AVR) (N/A) INTRAOPERATIVE TRANSESOPHAGEAL ECHOCARDIOGRAM (N/A) Hemodynamically stable off dopamine this am. Mobilize Will wait to diurese to be sure BP remains stable. DC foley.   LOS: 2 days    Evelene Croon K 09/01/2012

## 2012-09-01 NOTE — Progress Notes (Signed)
Subjective:  POD # 2 bioprosthetic AVR  Objective:  Temp:  [97 F (36.1 C)-98.2 F (36.8 C)] 97.8 F (36.6 C) (07/04 0730) Pulse Rate:  [63-126] 78 (07/04 0700) Resp:  [11-24] 17 (07/04 0700) BP: (73-155)/(40-61) 129/58 mmHg (07/04 0700) SpO2:  [92 %-100 %] 97 % (07/04 0700) Arterial Line BP: (112-168)/(53-77) 168/77 mmHg (07/03 1300) Weight:  [168 lb 14 oz (76.6 kg)] 168 lb 14 oz (76.6 kg) (07/04 0500) Weight change: 0 lb (0 kg)  Intake/Output from previous day: 07/03 0701 - 07/04 0700 In: 883.9 [P.O.:120; I.V.:663.9; IV Piggyback:100] Out: 975 [Urine:895; Chest Tube:80]  Intake/Output from this shift:    Physical Exam: General appearance: alert and no distress Neck: no adenopathy, no carotid bruit, no JVD, supple, symmetrical, trachea midline and thyroid not enlarged, symmetric, no tenderness/mass/nodules Lungs: clear to auscultation bilaterally Heart: soft outflow murmur Extremities: extremities normal, atraumatic, no cyanosis or edema  Lab Results: Results for orders placed during the hospital encounter of 08/30/12 (from the past 48 hour(s))  POCT I-STAT 4, (NA,K, GLUC, HGB,HCT)     Status: Abnormal   Collection Time    08/30/12 10:13 AM      Result Value Range   Sodium 136  135 - 145 mEq/L   Potassium 3.0 (*) 3.5 - 5.1 mEq/L   Glucose, Bld 128 (*) 70 - 99 mg/dL   HCT 16.1 (*) 09.6 - 04.5 %   Hemoglobin 9.9 (*) 12.0 - 15.0 g/dL  POCT I-STAT 3, BLOOD GAS (G3+)     Status: Abnormal   Collection Time    08/30/12 10:23 AM      Result Value Range   pH, Arterial 7.494 (*) 7.350 - 7.450   pCO2 arterial 34.2 (*) 35.0 - 45.0 mmHg   pO2, Arterial 335.0 (*) 80.0 - 100.0 mmHg   Bicarbonate 26.3 (*) 20.0 - 24.0 mEq/L   TCO2 27  0 - 100 mmol/L   O2 Saturation 100.0     Acid-Base Excess 3.0 (*) 0.0 - 2.0 mmol/L   Sample type ARTERIAL    POCT I-STAT 4, (NA,K, GLUC, HGB,HCT)     Status: Abnormal   Collection Time    08/30/12 10:27 AM      Result Value Range   Sodium  131 (*) 135 - 145 mEq/L   Potassium 2.9 (*) 3.5 - 5.1 mEq/L   Glucose, Bld 121 (*) 70 - 99 mg/dL   HCT 40.9 (*) 81.1 - 91.4 %   Hemoglobin 6.8 (*) 12.0 - 15.0 g/dL  HEMOGLOBIN AND HEMATOCRIT, BLOOD     Status: Abnormal   Collection Time    08/30/12 11:05 AM      Result Value Range   Hemoglobin 9.4 (*) 12.0 - 15.0 g/dL   Comment: RESULT CALLED TO, READ BACK BY AND VERIFIED WITH:     GENGLER,M RN @ 1124 ON 08/30/12 BY LEONARD,A   HCT 26.5 (*) 36.0 - 46.0 %   Comment: RESULT CALLED TO, READ BACK BY AND VERIFIED WITH:     GENGLER,M RN @ 1124 ON 08/30/12 BY LEONARD,A  PLATELET COUNT     Status: Abnormal   Collection Time    08/30/12 11:05 AM      Result Value Range   Platelets 131 (*) 150 - 400 K/uL   Comment: RESULT CALLED TO, READ BACK BY AND VERIFIED WITH:     GENGLER,M RN @ 1124 ON 08/30/12 BY LEONARD,A  POCT I-STAT 4, (NA,K, GLUC, HGB,HCT)     Status: Abnormal  Collection Time    08/30/12 11:10 AM      Result Value Range   Sodium 137  135 - 145 mEq/L   Potassium 3.8  3.5 - 5.1 mEq/L   Glucose, Bld 149 (*) 70 - 99 mg/dL   HCT 08.6 (*) 57.8 - 46.9 %   Hemoglobin 8.8 (*) 12.0 - 15.0 g/dL  POCT I-STAT 4, (NA,K, GLUC, HGB,HCT)     Status: Abnormal   Collection Time    08/30/12 11:56 AM      Result Value Range   Sodium 132 (*) 135 - 145 mEq/L   Potassium 3.8  3.5 - 5.1 mEq/L   Glucose, Bld 126 (*) 70 - 99 mg/dL   HCT 62.9 (*) 52.8 - 41.3 %   Hemoglobin 9.2 (*) 12.0 - 15.0 g/dL  POCT I-STAT 4, (NA,K, GLUC, HGB,HCT)     Status: Abnormal   Collection Time    08/30/12  1:11 PM      Result Value Range   Sodium 137  135 - 145 mEq/L   Potassium 3.3 (*) 3.5 - 5.1 mEq/L   Glucose, Bld 82  70 - 99 mg/dL   HCT 24.4 (*) 01.0 - 27.2 %   Hemoglobin 11.6 (*) 12.0 - 15.0 g/dL  POCT I-STAT 3, BLOOD GAS (G3+)     Status: Abnormal   Collection Time    08/30/12  1:12 PM      Result Value Range   pH, Arterial 7.408  7.350 - 7.450   pCO2 arterial 34.0 (*) 35.0 - 45.0 mmHg   pO2, Arterial 123.0  (*) 80.0 - 100.0 mmHg   Bicarbonate 22.0  20.0 - 24.0 mEq/L   TCO2 23  0 - 100 mmol/L   O2 Saturation 99.0     Acid-base deficit 3.0 (*) 0.0 - 2.0 mmol/L   Patient temperature 34.8 C     Sample type ARTERIAL    CBC     Status: Abnormal   Collection Time    08/30/12  1:18 PM      Result Value Range   WBC 15.4 (*) 4.0 - 10.5 K/uL   RBC 3.83 (*) 3.87 - 5.11 MIL/uL   Hemoglobin 12.0  12.0 - 15.0 g/dL   HCT 53.6 (*) 64.4 - 03.4 %   MCV 88.0  78.0 - 100.0 fL   MCH 31.3  26.0 - 34.0 pg   MCHC 35.6  30.0 - 36.0 g/dL   RDW 74.2  59.5 - 63.8 %   Platelets 108 (*) 150 - 400 K/uL   Comment: PLATELET COUNT CONFIRMED BY SMEAR  PROTIME-INR     Status: Abnormal   Collection Time    08/30/12  1:18 PM      Result Value Range   Prothrombin Time 17.4 (*) 11.6 - 15.2 seconds   INR 1.47  0.00 - 1.49  APTT     Status: Abnormal   Collection Time    08/30/12  1:18 PM      Result Value Range   aPTT 44 (*) 24 - 37 seconds   Comment:            IF BASELINE aPTT IS ELEVATED,     SUGGEST PATIENT RISK ASSESSMENT     BE USED TO DETERMINE APPROPRIATE     ANTICOAGULANT THERAPY.  GLUCOSE, CAPILLARY     Status: None   Collection Time    08/30/12  2:12 PM      Result Value Range   Glucose-Capillary 90  70 - 99 mg/dL   Comment 1 Documented in Chart    GLUCOSE, CAPILLARY     Status: Abnormal   Collection Time    08/30/12  3:14 PM      Result Value Range   Glucose-Capillary 101 (*) 70 - 99 mg/dL   Comment 1 Documented in Chart    GLUCOSE, CAPILLARY     Status: Abnormal   Collection Time    08/30/12  4:18 PM      Result Value Range   Glucose-Capillary 109 (*) 70 - 99 mg/dL   Comment 1 Documented in Chart    GLUCOSE, CAPILLARY     Status: Abnormal   Collection Time    08/30/12  5:48 PM      Result Value Range   Glucose-Capillary 139 (*) 70 - 99 mg/dL  GLUCOSE, CAPILLARY     Status: Abnormal   Collection Time    08/30/12  6:35 PM      Result Value Range   Glucose-Capillary 161 (*) 70 - 99 mg/dL    CBC     Status: Abnormal   Collection Time    08/30/12  7:30 PM      Result Value Range   WBC 16.2 (*) 4.0 - 10.5 K/uL   RBC 3.86 (*) 3.87 - 5.11 MIL/uL   Hemoglobin 11.8 (*) 12.0 - 15.0 g/dL   HCT 40.9 (*) 81.1 - 91.4 %   MCV 88.1  78.0 - 100.0 fL   MCH 30.6  26.0 - 34.0 pg   MCHC 34.7  30.0 - 36.0 g/dL   RDW 78.2  95.6 - 21.3 %   Platelets 111 (*) 150 - 400 K/uL   Comment: CONSISTENT WITH PREVIOUS RESULT  CREATININE, SERUM     Status: Abnormal   Collection Time    08/30/12  7:30 PM      Result Value Range   Creatinine, Ser 0.81  0.50 - 1.10 mg/dL   GFR calc non Af Amer 67 (*) >90 mL/min   GFR calc Af Amer 78 (*) >90 mL/min   Comment:            The eGFR has been calculated     using the CKD EPI equation.     This calculation has not been     validated in all clinical     situations.     eGFR's persistently     <90 mL/min signify     possible Chronic Kidney Disease.  MAGNESIUM     Status: Abnormal   Collection Time    08/30/12  7:30 PM      Result Value Range   Magnesium 3.2 (*) 1.5 - 2.5 mg/dL  GLUCOSE, CAPILLARY     Status: Abnormal   Collection Time    08/30/12  8:05 PM      Result Value Range   Glucose-Capillary 151 (*) 70 - 99 mg/dL  POCT I-STAT, CHEM 8     Status: Abnormal   Collection Time    08/30/12  8:07 PM      Result Value Range   Sodium 138  135 - 145 mEq/L   Potassium 3.7  3.5 - 5.1 mEq/L   Chloride 105  96 - 112 mEq/L   BUN 9  6 - 23 mg/dL   Creatinine, Ser 0.86  0.50 - 1.10 mg/dL   Glucose, Bld 578 (*) 70 - 99 mg/dL   Calcium, Ion 4.69 (*) 1.13 - 1.30 mmol/L   TCO2 22  0 - 100 mmol/L   Hemoglobin 11.2 (*) 12.0 - 15.0 g/dL   HCT 44.0 (*) 10.2 - 72.5 %  GLUCOSE, CAPILLARY     Status: Abnormal   Collection Time    08/31/12 12:13 AM      Result Value Range   Glucose-Capillary 181 (*) 70 - 99 mg/dL  POCT I-STAT 3, BLOOD GAS (G3+)     Status: Abnormal   Collection Time    08/31/12 12:15 AM      Result Value Range   pH, Arterial 7.331 (*)  7.350 - 7.450   pCO2 arterial 41.2  35.0 - 45.0 mmHg   pO2, Arterial 111.0 (*) 80.0 - 100.0 mmHg   Bicarbonate 21.9  20.0 - 24.0 mEq/L   TCO2 23  0 - 100 mmol/L   O2 Saturation 98.0     Acid-base deficit 4.0 (*) 0.0 - 2.0 mmol/L   Patient temperature 36.6 C     Collection site ARTERIAL LINE     Drawn by Operator     Sample type ARTERIAL    GLUCOSE, CAPILLARY     Status: Abnormal   Collection Time    08/31/12  1:22 AM      Result Value Range   Glucose-Capillary 139 (*) 70 - 99 mg/dL  POCT I-STAT 3, BLOOD GAS (G3+)     Status: Abnormal   Collection Time    08/31/12  1:25 AM      Result Value Range   pH, Arterial 7.350  7.350 - 7.450   pCO2 arterial 41.8  35.0 - 45.0 mmHg   pO2, Arterial 124.0 (*) 80.0 - 100.0 mmHg   Bicarbonate 23.1  20.0 - 24.0 mEq/L   TCO2 24  0 - 100 mmol/L   O2 Saturation 99.0     Acid-base deficit 3.0 (*) 0.0 - 2.0 mmol/L   Patient temperature 36.7 C     Collection site ARTERIAL LINE     Drawn by Operator     Sample type ARTERIAL    CBC     Status: Abnormal   Collection Time    08/31/12  4:00 AM      Result Value Range   WBC 15.7 (*) 4.0 - 10.5 K/uL   RBC 3.68 (*) 3.87 - 5.11 MIL/uL   Hemoglobin 11.5 (*) 12.0 - 15.0 g/dL   HCT 36.6 (*) 44.0 - 34.7 %   MCV 88.6  78.0 - 100.0 fL   MCH 31.3  26.0 - 34.0 pg   MCHC 35.3  30.0 - 36.0 g/dL   RDW 42.5  95.6 - 38.7 %   Platelets 101 (*) 150 - 400 K/uL   Comment: CONSISTENT WITH PREVIOUS RESULT  BASIC METABOLIC PANEL     Status: Abnormal   Collection Time    08/31/12  4:00 AM      Result Value Range   Sodium 137  135 - 145 mEq/L   Potassium 3.8  3.5 - 5.1 mEq/L   Chloride 108  96 - 112 mEq/L   CO2 23  19 - 32 mEq/L   Glucose, Bld 141 (*) 70 - 99 mg/dL   BUN 10  6 - 23 mg/dL   Creatinine, Ser 5.64  0.50 - 1.10 mg/dL   Calcium 7.7 (*) 8.4 - 10.5 mg/dL   GFR calc non Af Amer 77 (*) >90 mL/min   GFR calc Af Amer 89 (*) >90 mL/min   Comment:  The eGFR has been calculated     using the CKD  EPI equation.     This calculation has not been     validated in all clinical     situations.     eGFR's persistently     <90 mL/min signify     possible Chronic Kidney Disease.  MAGNESIUM     Status: Abnormal   Collection Time    08/31/12  4:00 AM      Result Value Range   Magnesium 2.7 (*) 1.5 - 2.5 mg/dL  GLUCOSE, CAPILLARY     Status: None   Collection Time    08/31/12  8:12 AM      Result Value Range   Glucose-Capillary 78  70 - 99 mg/dL  GLUCOSE, CAPILLARY     Status: Abnormal   Collection Time    08/31/12  3:37 PM      Result Value Range   Glucose-Capillary 120 (*) 70 - 99 mg/dL   Comment 1 Notify RN    POCT I-STAT, CHEM 8     Status: Abnormal   Collection Time    08/31/12  4:53 PM      Result Value Range   Sodium 138  135 - 145 mEq/L   Potassium 4.0  3.5 - 5.1 mEq/L   Chloride 103  96 - 112 mEq/L   BUN 14  6 - 23 mg/dL   Creatinine, Ser 1.61  0.50 - 1.10 mg/dL   Glucose, Bld 096 (*) 70 - 99 mg/dL   Calcium, Ion 0.45 (*) 1.13 - 1.30 mmol/L   TCO2 22  0 - 100 mmol/L   Hemoglobin 13.3  12.0 - 15.0 g/dL   HCT 40.9  81.1 - 91.4 %  MAGNESIUM     Status: None   Collection Time    08/31/12  4:55 PM      Result Value Range   Magnesium 2.5  1.5 - 2.5 mg/dL  CBC     Status: Abnormal   Collection Time    08/31/12  4:55 PM      Result Value Range   WBC 16.3 (*) 4.0 - 10.5 K/uL   RBC 4.11  3.87 - 5.11 MIL/uL   Hemoglobin 12.7  12.0 - 15.0 g/dL   HCT 78.2  95.6 - 21.3 %   MCV 90.8  78.0 - 100.0 fL   MCH 30.9  26.0 - 34.0 pg   MCHC 34.0  30.0 - 36.0 g/dL   RDW 08.6  57.8 - 46.9 %   Platelets 113 (*) 150 - 400 K/uL   Comment: CONSISTENT WITH PREVIOUS RESULT  CREATININE, SERUM     Status: Abnormal   Collection Time    08/31/12  4:55 PM      Result Value Range   Creatinine, Ser 0.95  0.50 - 1.10 mg/dL   GFR calc non Af Amer 55 (*) >90 mL/min   GFR calc Af Amer 64 (*) >90 mL/min   Comment:            The eGFR has been calculated     using the CKD EPI equation.      This calculation has not been     validated in all clinical     situations.     eGFR's persistently     <90 mL/min signify     possible Chronic Kidney Disease.  GLUCOSE, CAPILLARY     Status: Abnormal   Collection Time    08/31/12  7:27  PM      Result Value Range   Glucose-Capillary 128 (*) 70 - 99 mg/dL   Comment 1 Documented in Chart     Comment 2 Notify RN    GLUCOSE, CAPILLARY     Status: None   Collection Time    08/31/12 11:16 PM      Result Value Range   Glucose-Capillary 79  70 - 99 mg/dL   Comment 1 Documented in Chart     Comment 2 Notify RN    GLUCOSE, CAPILLARY     Status: None   Collection Time    09/01/12  3:56 AM      Result Value Range   Glucose-Capillary 79  70 - 99 mg/dL   Comment 1 Documented in Chart     Comment 2 Notify RN    BASIC METABOLIC PANEL     Status: Abnormal   Collection Time    09/01/12  4:15 AM      Result Value Range   Sodium 135  135 - 145 mEq/L   Potassium 4.3  3.5 - 5.1 mEq/L   Chloride 104  96 - 112 mEq/L   CO2 24  19 - 32 mEq/L   Glucose, Bld 111 (*) 70 - 99 mg/dL   BUN 15  6 - 23 mg/dL   Creatinine, Ser 1.61  0.50 - 1.10 mg/dL   Calcium 7.5 (*) 8.4 - 10.5 mg/dL   GFR calc non Af Amer 63 (*) >90 mL/min   GFR calc Af Amer 74 (*) >90 mL/min   Comment:            The eGFR has been calculated     using the CKD EPI equation.     This calculation has not been     validated in all clinical     situations.     eGFR's persistently     <90 mL/min signify     possible Chronic Kidney Disease.  CBC     Status: Abnormal   Collection Time    09/01/12  4:15 AM      Result Value Range   WBC 12.3 (*) 4.0 - 10.5 K/uL   RBC 3.57 (*) 3.87 - 5.11 MIL/uL   Hemoglobin 11.3 (*) 12.0 - 15.0 g/dL   HCT 09.6 (*) 04.5 - 40.9 %   MCV 90.2  78.0 - 100.0 fL   MCH 31.7  26.0 - 34.0 pg   MCHC 35.1  30.0 - 36.0 g/dL   RDW 81.1  91.4 - 78.2 %   Platelets 89 (*) 150 - 400 K/uL   Comment: CONSISTENT WITH PREVIOUS RESULT  GLUCOSE, CAPILLARY     Status:  None   Collection Time    09/01/12  7:34 AM      Result Value Range   Glucose-Capillary 96  70 - 99 mg/dL    Imaging: Imaging results have been reviewed  Assessment/Plan:   1. Principal Problem: 2.   S/P aortic valve replacement with bioprosthetic valve 3.   Time Spent Directly with Patient:  20 minutes  Length of Stay:  LOS: 2 days   Still on low dose Dopamine. SBP in low 100s. NSR. Labs OK. Exam benign. Wean pressors. Hold off on BB for now. CRH. Nl progression. \ BERRY,JONATHAN J 09/01/2012, 9:26 AM

## 2012-09-01 NOTE — Progress Notes (Signed)
Patient ID: [name], female   DOB: 1931/08/02, 79 y.o.   MRN: [id number]   SICU Evening Rounds:  She went into A-fib with RVR this evening and was started on amiodarone. Rate is better controlled but still in A-fib.  Urine output is 15-20/hr but will hold off on diuresis for now since BP in 90's. Creat was normal this am.

## 2012-09-02 DIAGNOSIS — I10 Essential (primary) hypertension: Secondary | ICD-10-CM

## 2012-09-02 LAB — BASIC METABOLIC PANEL
BUN: 21 mg/dL (ref 6–23)
CO2: 22 mEq/L (ref 19–32)
Calcium: 7.6 mg/dL — ABNORMAL LOW (ref 8.4–10.5)
Chloride: 100 mEq/L (ref 96–112)
Creatinine, Ser: 1.06 mg/dL (ref 0.50–1.10)
GFR calc Af Amer: 56 mL/min — ABNORMAL LOW (ref >90)
GFR calc non Af Amer: 49 mL/min — ABNORMAL LOW (ref >90)
Glucose, Bld: 132 mg/dL — ABNORMAL HIGH (ref 70–99)
Potassium: 3.3 mEq/L — ABNORMAL LOW (ref 3.5–5.1)
Sodium: 133 mEq/L — ABNORMAL LOW (ref 135–145)

## 2012-09-02 MED ORDER — POTASSIUM CHLORIDE CRYS ER 20 MEQ PO TBCR
40.0000 meq | EXTENDED_RELEASE_TABLET | Freq: Once | ORAL | Status: AC
  Administered 2012-09-02: 40 meq via ORAL
  Filled 2012-09-02: qty 2

## 2012-09-02 NOTE — Progress Notes (Signed)
Subjective:  POD #3 Bioprosthetic AVR. No CP/SOB  Objective:  Temp:  [97.6 F (36.4 C)-98.3 F (36.8 C)] 98.1 F (36.7 C) (07/05 0808) Pulse Rate:  [25-110] 81 (07/05 0800) Resp:  [12-29] 19 (07/05 0800) BP: (84-119)/(47-79) 96/48 mmHg (07/05 0800) SpO2:  [86 %-100 %] 96 % (07/05 0800) Weight:  [171 lb 4.8 oz (77.701 kg)] 171 lb 4.8 oz (77.701 kg) (07/05 0600) Weight change: 2 lb 6.8 oz (1.101 kg)  Intake/Output from previous day: 07/04 0701 - 07/05 0700 In: 1320 [P.O.:480; I.V.:790; IV Piggyback:50] Out: 507 [Urine:507]  Intake/Output from this shift: Total I/O In: 26.7 [I.V.:26.7] Out: 1 [Emesis/NG output:1]  Physical Exam: General appearance: alert and no distress Neck: no adenopathy, no carotid bruit, no JVD, supple, symmetrical, trachea midline and thyroid not enlarged, symmetric, no tenderness/mass/nodules Lungs: clear to auscultation bilaterally Heart: irregularly irregular rhythm Extremities: extremities normal, atraumatic, no cyanosis or edema  Lab Results: Results for orders placed during the hospital encounter of 08/30/12 (from the past 48 hour(s))  GLUCOSE, CAPILLARY     Status: Abnormal   Collection Time    08/31/12  3:37 PM      Result Value Range   Glucose-Capillary 120 (*) 70 - 99 mg/dL   Comment 1 Notify RN    POCT I-STAT, CHEM 8     Status: Abnormal   Collection Time    08/31/12  4:53 PM      Result Value Range   Sodium 138  135 - 145 mEq/L   Potassium 4.0  3.5 - 5.1 mEq/L   Chloride 103  96 - 112 mEq/L   BUN 14  6 - 23 mg/dL   Creatinine, Ser 1.61  0.50 - 1.10 mg/dL   Glucose, Bld 096 (*) 70 - 99 mg/dL   Calcium, Ion 0.45 (*) 1.13 - 1.30 mmol/L   TCO2 22  0 - 100 mmol/L   Hemoglobin 13.3  12.0 - 15.0 g/dL   HCT 40.9  81.1 - 91.4 %  MAGNESIUM     Status: None   Collection Time    08/31/12  4:55 PM      Result Value Range   Magnesium 2.5  1.5 - 2.5 mg/dL  CBC     Status: Abnormal   Collection Time    08/31/12  4:55 PM      Result  Value Range   WBC 16.3 (*) 4.0 - 10.5 K/uL   RBC 4.11  3.87 - 5.11 MIL/uL   Hemoglobin 12.7  12.0 - 15.0 g/dL   HCT 78.2  95.6 - 21.3 %   MCV 90.8  78.0 - 100.0 fL   MCH 30.9  26.0 - 34.0 pg   MCHC 34.0  30.0 - 36.0 g/dL   RDW 08.6  57.8 - 46.9 %   Platelets 113 (*) 150 - 400 K/uL   Comment: CONSISTENT WITH PREVIOUS RESULT  CREATININE, SERUM     Status: Abnormal   Collection Time    08/31/12  4:55 PM      Result Value Range   Creatinine, Ser 0.95  0.50 - 1.10 mg/dL   GFR calc non Af Amer 55 (*) >90 mL/min   GFR calc Af Amer 64 (*) >90 mL/min   Comment:            The eGFR has been calculated     using the CKD EPI equation.     This calculation has not been     validated in all clinical  situations.     eGFR's persistently     <90 mL/min signify     possible Chronic Kidney Disease.  GLUCOSE, CAPILLARY     Status: Abnormal   Collection Time    08/31/12  7:27 PM      Result Value Range   Glucose-Capillary 128 (*) 70 - 99 mg/dL   Comment 1 Documented in Chart     Comment 2 Notify RN    GLUCOSE, CAPILLARY     Status: None   Collection Time    08/31/12 11:16 PM      Result Value Range   Glucose-Capillary 79  70 - 99 mg/dL   Comment 1 Documented in Chart     Comment 2 Notify RN    GLUCOSE, CAPILLARY     Status: None   Collection Time    09/01/12  3:56 AM      Result Value Range   Glucose-Capillary 79  70 - 99 mg/dL   Comment 1 Documented in Chart     Comment 2 Notify RN    BASIC METABOLIC PANEL     Status: Abnormal   Collection Time    09/01/12  4:15 AM      Result Value Range   Sodium 135  135 - 145 mEq/L   Potassium 4.3  3.5 - 5.1 mEq/L   Chloride 104  96 - 112 mEq/L   CO2 24  19 - 32 mEq/L   Glucose, Bld 111 (*) 70 - 99 mg/dL   BUN 15  6 - 23 mg/dL   Creatinine, Ser 1.61  0.50 - 1.10 mg/dL   Calcium 7.5 (*) 8.4 - 10.5 mg/dL   GFR calc non Af Amer 63 (*) >90 mL/min   GFR calc Af Amer 74 (*) >90 mL/min   Comment:            The eGFR has been calculated      using the CKD EPI equation.     This calculation has not been     validated in all clinical     situations.     eGFR's persistently     <90 mL/min signify     possible Chronic Kidney Disease.  CBC     Status: Abnormal   Collection Time    09/01/12  4:15 AM      Result Value Range   WBC 12.3 (*) 4.0 - 10.5 K/uL   RBC 3.57 (*) 3.87 - 5.11 MIL/uL   Hemoglobin 11.3 (*) 12.0 - 15.0 g/dL   HCT 09.6 (*) 04.5 - 40.9 %   MCV 90.2  78.0 - 100.0 fL   MCH 31.7  26.0 - 34.0 pg   MCHC 35.1  30.0 - 36.0 g/dL   RDW 81.1  91.4 - 78.2 %   Platelets 89 (*) 150 - 400 K/uL   Comment: CONSISTENT WITH PREVIOUS RESULT  GLUCOSE, CAPILLARY     Status: None   Collection Time    09/01/12  7:34 AM      Result Value Range   Glucose-Capillary 96  70 - 99 mg/dL  GLUCOSE, CAPILLARY     Status: Abnormal   Collection Time    09/01/12 12:06 PM      Result Value Range   Glucose-Capillary 128 (*) 70 - 99 mg/dL  BASIC METABOLIC PANEL     Status: Abnormal   Collection Time    09/02/12  6:45 AM      Result Value Range   Sodium 133 (*)  135 - 145 mEq/L   Potassium 3.3 (*) 3.5 - 5.1 mEq/L   Chloride 100  96 - 112 mEq/L   CO2 22  19 - 32 mEq/L   Glucose, Bld 132 (*) 70 - 99 mg/dL   BUN 21  6 - 23 mg/dL   Creatinine, Ser 1.61  0.50 - 1.10 mg/dL   Calcium 7.6 (*) 8.4 - 10.5 mg/dL   GFR calc non Af Amer 49 (*) >90 mL/min   GFR calc Af Amer 56 (*) >90 mL/min   Comment:            The eGFR has been calculated     using the CKD EPI equation.     This calculation has not been     validated in all clinical     situations.     eGFR's persistently     <90 mL/min signify     possible Chronic Kidney Disease.    Imaging: Imaging results have been reviewed  Assessment/Plan:   1. Principal Problem: 2.   S/P aortic valve replacement with bioprosthetic valve 3.   Time Spent Directly with Patient:  20 minutes  Length of Stay:  LOS: 3 days   Looks great. Off Dop. Went in to AFIB yesterday and is on Amio with  CVR. Exam benign. BP soft. Labs OK. Holding off on diuretic secondary to low BP. Progression per TCTS.  BERRY,JONATHAN J 09/02/2012, 9:15 AM

## 2012-09-02 NOTE — Anesthesia Postprocedure Evaluation (Signed)
  Anesthesia Post-op Note  Patient: [name]  Procedure(s) Performed: Procedure(s): AORTIC VALVE REPLACEMENT (AVR) (N/A) INTRAOPERATIVE TRANSESOPHAGEAL ECHOCARDIOGRAM (N/A)  Patient Location: ICU  Anesthesia Type:General  Level of Consciousness: sedated  Airway and Oxygen Therapy: Patient remains intubated per anesthesia plan  Post-op Pain: none  Post-op Assessment: Post-op Vital signs reviewed, Patient's Cardiovascular Status Stable and Respiratory Function Stable  Post-op Vital Signs: Reviewed and stable  Complications: No apparent anesthesia complications

## 2012-09-02 NOTE — Progress Notes (Signed)
3 Days Post-Op Procedure(s) (LRB): AORTIC VALVE REPLACEMENT (AVR) (N/A) INTRAOPERATIVE TRANSESOPHAGEAL ECHOCARDIOGRAM (N/A) Subjective: No complaints  Objective: Vital signs in last 24 hours: Temp:  [97.6 F (36.4 C)-98.3 F (36.8 C)] 98.1 F (36.7 C) (07/05 0808) Pulse Rate:  [26-110] 88 (07/05 1100) Cardiac Rhythm:  [-] Atrial fibrillation (07/05 1100) Resp:  [12-29] 19 (07/05 1100) BP: (80-119)/(45-79) 80/60 mmHg (07/05 1100) SpO2:  [92 %-100 %] 100 % (07/05 1100) Weight:  [77.701 kg (171 lb 4.8 oz)] 77.701 kg (171 lb 4.8 oz) (07/05 0600)  Hemodynamic parameters for last 24 hours:    Intake/Output from previous day: 07/04 0701 - 07/05 0700 In: 1320 [P.O.:480; I.V.:790; IV Piggyback:50] Out: 507 [Urine:507] Intake/Output this shift: Total I/O In: 310.1 [P.O.:200; I.V.:110.1] Out: 39 [Urine:60; Emesis/NG output:1]  General appearance: alert and cooperative Heart: irregularly irregular rhythm Lungs: diminished breath sounds bibasilar Extremities: edema mild  Wound: incision ok  Lab Results:  Recent Labs  08/31/12 1655 09/01/12 0415  WBC 16.3* 12.3*  HGB 12.7 11.3*  HCT 37.3 32.2*  PLT 113* 89*   BMET:  Recent Labs  09/01/12 0415 09/02/12 0645  NA 135 133*  K 4.3 3.3*  CL 104 100  CO2 24 22  GLUCOSE 111* 132*  BUN 15 21  CREATININE 0.85 1.06  CALCIUM 7.5* 7.6*    PT/INR:  Recent Labs  08/30/12 1318  LABPROT 17.4*  INR 1.47   ABG    Component Value Date/Time   PHART 7.350 08/31/2012 0125   HCO3 23.1 08/31/2012 0125   TCO2 22 08/31/2012 1653   ACIDBASEDEF 3.0* 08/31/2012 0125   O2SAT 99.0 08/31/2012 0125   CBG (last 3)   Recent Labs  09/01/12 0356 09/01/12 0734 09/01/12 1206  GLUCAP 79 96 128*    Assessment/Plan: S/P Procedure(s) (LRB): AORTIC VALVE REPLACEMENT (AVR) (N/A) INTRAOPERATIVE TRANSESOPHAGEAL ECHOCARDIOGRAM (N/A) She continues to run a borderline BP. Hold off on diuresis until BP rises. The only medication that might drop her  BP is amio. A-fib may be contributing. She has a normal LV.  Postop A-fib: rate controlled on amiodarone. Continue IV today.   LOS: 3 days    BARTLE,BRYAN K 09/02/2012

## 2012-09-02 NOTE — Progress Notes (Signed)
Nutrition Brief Note  Malnutrition Screening Tool result is inaccurate.  Please consult if nutrition needs are identified.  Avyn Coate MS, RD, LDN Pager: [phone] After-hours pager: [phone]    

## 2012-09-03 LAB — GLUCOSE, CAPILLARY: Glucose-Capillary: 113 mg/dL — ABNORMAL HIGH (ref 70–99)

## 2012-09-03 LAB — BASIC METABOLIC PANEL
BUN: 17 mg/dL (ref 6–23)
CO2: 25 mEq/L (ref 19–32)
Calcium: 7.7 mg/dL — ABNORMAL LOW (ref 8.4–10.5)
Chloride: 105 mEq/L (ref 96–112)
Creatinine, Ser: 0.97 mg/dL (ref 0.50–1.10)
GFR calc Af Amer: 63 mL/min — ABNORMAL LOW (ref >90)
GFR calc non Af Amer: 54 mL/min — ABNORMAL LOW (ref >90)
Glucose, Bld: 120 mg/dL — ABNORMAL HIGH (ref 70–99)
Potassium: 4.4 mEq/L (ref 3.5–5.1)
Sodium: 133 mEq/L — ABNORMAL LOW (ref 135–145)

## 2012-09-03 MED ORDER — FUROSEMIDE 40 MG PO TABS
40.0000 mg | ORAL_TABLET | Freq: Every day | ORAL | Status: AC
  Administered 2012-09-04 – 2012-09-05 (×2): 40 mg via ORAL
  Filled 2012-09-03 (×2): qty 1

## 2012-09-03 MED ORDER — ACETAMINOPHEN 325 MG PO TABS
650.0000 mg | ORAL_TABLET | Freq: Four times a day (QID) | ORAL | Status: DC | PRN
  Administered 2012-09-06 – 2012-09-07 (×2): 650 mg via ORAL
  Filled 2012-09-03 (×3): qty 2

## 2012-09-03 MED ORDER — AMIODARONE HCL 200 MG PO TABS
400.0000 mg | ORAL_TABLET | Freq: Two times a day (BID) | ORAL | Status: DC
  Administered 2012-09-03 – 2012-09-08 (×10): 400 mg via ORAL
  Filled 2012-09-03 (×12): qty 2

## 2012-09-03 MED ORDER — ASPIRIN EC 325 MG PO TBEC
325.0000 mg | DELAYED_RELEASE_TABLET | Freq: Every day | ORAL | Status: DC
  Administered 2012-09-03 – 2012-09-05 (×3): 325 mg via ORAL
  Filled 2012-09-03 (×4): qty 1

## 2012-09-03 MED ORDER — FAMOTIDINE 20 MG PO TABS
20.0000 mg | ORAL_TABLET | Freq: Two times a day (BID) | ORAL | Status: DC
  Administered 2012-09-03 – 2012-09-08 (×11): 20 mg via ORAL
  Filled 2012-09-03 (×13): qty 1

## 2012-09-03 MED ORDER — MOVING RIGHT ALONG BOOK
Freq: Once | Status: AC
  Administered 2012-09-03: 20:00:00
  Filled 2012-09-03: qty 1

## 2012-09-03 MED ORDER — POTASSIUM CHLORIDE CRYS ER 20 MEQ PO TBCR
40.0000 meq | EXTENDED_RELEASE_TABLET | Freq: Every day | ORAL | Status: AC
  Administered 2012-09-04 – 2012-09-05 (×2): 40 meq via ORAL
  Filled 2012-09-03 (×2): qty 2

## 2012-09-03 MED ORDER — FUROSEMIDE 10 MG/ML IJ SOLN
40.0000 mg | Freq: Once | INTRAMUSCULAR | Status: AC
  Administered 2012-09-03: 40 mg via INTRAVENOUS
  Filled 2012-09-03: qty 4

## 2012-09-03 NOTE — Progress Notes (Signed)
Subjective:  No CP/SOB  Objective:  Temp:  [97.4 F (36.3 C)-98.1 F (36.7 C)] 98.1 F (36.7 C) (07/06 0748) Pulse Rate:  [68-97] 79 (07/06 0900) Resp:  [12-28] 18 (07/06 0900) BP: (80-134)/(47-71) 134/61 mmHg (07/06 0900) SpO2:  [95 %-100 %] 100 % (07/06 0900) Weight:  [172 lb 2.9 oz (78.1 kg)] 172 lb 2.9 oz (78.1 kg) (07/06 0500) Weight change: 14.1 oz (0.399 kg)  Intake/Output from previous day: 07/05 0701 - 07/06 0700 In: 1580.8 [P.O.:700; I.V.:880.8] Out: 1981 [Urine:1980; Emesis/NG output:1]  Intake/Output from this shift: Total I/O In: 313.4 [P.O.:240; I.V.:73.4] Out: 150 [Urine:150]  Physical Exam: General appearance: alert and no distress Neck: no adenopathy, no carotid bruit, no JVD, supple, symmetrical, trachea midline and thyroid not enlarged, symmetric, no tenderness/mass/nodules Lungs: clear to auscultation bilaterally Heart: regular rate and rhythm, S1, S2 normal, no murmur, click, rub or gallop Extremities: extremities normal, atraumatic, no cyanosis or edema  Lab Results: Results for orders placed during the hospital encounter of 08/30/12 (from the past 48 hour(s))  GLUCOSE, CAPILLARY     Status: Abnormal   Collection Time    09/01/12 12:06 PM      Result Value Range   Glucose-Capillary 128 (*) 70 - 99 mg/dL  BASIC METABOLIC PANEL     Status: Abnormal   Collection Time    09/02/12  6:45 AM      Result Value Range   Sodium 133 (*) 135 - 145 mEq/L   Potassium 3.3 (*) 3.5 - 5.1 mEq/L   Chloride 100  96 - 112 mEq/L   CO2 22  19 - 32 mEq/L   Glucose, Bld 132 (*) 70 - 99 mg/dL   BUN 21  6 - 23 mg/dL   Creatinine, Ser 1.61  0.50 - 1.10 mg/dL   Calcium 7.6 (*) 8.4 - 10.5 mg/dL   GFR calc non Af Amer 49 (*) >90 mL/min   GFR calc Af Amer 56 (*) >90 mL/min   Comment:            The eGFR has been calculated     using the CKD EPI equation.     This calculation has not been     validated in all clinical     situations.     eGFR's persistently   <90 mL/min signify     possible Chronic Kidney Disease.  BASIC METABOLIC PANEL     Status: Abnormal   Collection Time    09/03/12  4:25 AM      Result Value Range   Sodium 133 (*) 135 - 145 mEq/L   Potassium 4.4  3.5 - 5.1 mEq/L   Comment: DELTA CHECK NOTED     NO VISIBLE HEMOLYSIS   Chloride 105  96 - 112 mEq/L   CO2 25  19 - 32 mEq/L   Glucose, Bld 120 (*) 70 - 99 mg/dL   BUN 17  6 - 23 mg/dL   Creatinine, Ser 0.96  0.50 - 1.10 mg/dL   Calcium 7.7 (*) 8.4 - 10.5 mg/dL   GFR calc non Af Amer 54 (*) >90 mL/min   GFR calc Af Amer 63 (*) >90 mL/min   Comment:            The eGFR has been calculated     using the CKD EPI equation.     This calculation has not been     validated in all clinical     situations.     eGFR's  persistently     <90 mL/min signify     possible Chronic Kidney Disease.    Imaging: Imaging results have been reviewed  Assessment/Plan:   1. Principal Problem: 2.   S/P aortic valve replacement with bioprosthetic valve 3.   Time Spent Directly with Patient:  20 minutes  Length of Stay:  LOS: 4 days   POD #4 AVR with bioprosthesis. In NRS on Amio. BP still soft. Exam benign. Off pressors. Labs and exam benign. Nl progression. Tx to tele per TCTS.  Runell Gess 09/03/2012, 9:59 AM

## 2012-09-03 NOTE — Progress Notes (Signed)
Patient transferred from Unit 2300 to Unit 2000, after report was given to receiving RN, Fayrene Fearing.  Patient's medications, chart, and personal belongings were all sent with patient.  Family present during transfer.  Keitha Butte, RN

## 2012-09-04 ENCOUNTER — Encounter (HOSPITAL_COMMUNITY): Payer: Self-pay | Admitting: Cardiology

## 2012-09-04 DIAGNOSIS — I480 Paroxysmal atrial fibrillation: Secondary | ICD-10-CM | POA: Diagnosis not present

## 2012-09-04 DIAGNOSIS — I4891 Unspecified atrial fibrillation: Secondary | ICD-10-CM | POA: Diagnosis not present

## 2012-09-04 HISTORY — DX: Paroxysmal atrial fibrillation: I48.0

## 2012-09-04 MED ORDER — WARFARIN SODIUM 2 MG PO TABS
2.0000 mg | ORAL_TABLET | Freq: Every day | ORAL | Status: DC
  Administered 2012-09-04 – 2012-09-05 (×2): 2 mg via ORAL
  Filled 2012-09-04 (×3): qty 1

## 2012-09-04 MED ORDER — BISACODYL 10 MG RE SUPP
10.0000 mg | Freq: Once | RECTAL | Status: AC
  Administered 2012-09-04: 10 mg via RECTAL
  Filled 2012-09-04: qty 1

## 2012-09-04 MED ORDER — WARFARIN - PHYSICIAN DOSING INPATIENT
Freq: Every day | Status: DC
  Administered 2012-09-04 – 2012-09-07 (×3)

## 2012-09-04 MED ORDER — DOCUSATE SODIUM 100 MG PO CAPS
100.0000 mg | ORAL_CAPSULE | Freq: Two times a day (BID) | ORAL | Status: DC
  Administered 2012-09-04 – 2012-09-08 (×9): 100 mg via ORAL
  Filled 2012-09-04 (×10): qty 1

## 2012-09-04 MED ORDER — METOPROLOL TARTRATE 12.5 MG HALF TABLET
12.5000 mg | ORAL_TABLET | Freq: Two times a day (BID) | ORAL | Status: DC
  Administered 2012-09-04 – 2012-09-05 (×4): 12.5 mg via ORAL
  Filled 2012-09-04 (×6): qty 1

## 2012-09-04 MED ORDER — LACTULOSE 10 GM/15ML PO SOLN
30.0000 g | Freq: Every day | ORAL | Status: DC | PRN
  Administered 2012-09-04 – 2012-09-05 (×2): 30 g via ORAL
  Filled 2012-09-04 (×2): qty 45

## 2012-09-04 NOTE — Progress Notes (Signed)
CARDIAC REHAB PHASE I   PRE:  Rate/Rhythm: 99 afib  BP:  Supine: 100/50  Sitting:   Standing:    SaO2: 94%RA  MODE:  Ambulation: 390 ft   POST:  Rate/Rhythm: 120 afib  To 90s with rest.  BP:  Supine:   Sitting: 120/70  Standing:    SaO2: 98%RA 0830-0932 Pt walked 390 ft on RA with rolling walker and asst x 1. Pt c/o feeling SOB and sick. Sats at 98%RA upon return to room. Tried to get pt to go to recliner but she refused. Stated she had to go back to bed because she was so sick. Tried to get pt to describe feelings but mainly SOB and slightly nauseated. Stated she was constipated and needed BM. Asked for gingerale which I got for pt. Encouraged her to use IS and to get to recliner as soon as she was feeling better. Discussed that her heart rate at 120 afib may make her feel SOB when walking.    Bonnie Nutting, RN BSN  09/04/2012 9:30 AM

## 2012-09-04 NOTE — Progress Notes (Signed)
      Subjective: No chest pain, no SOB, + constipation with hx rectal bleeding due to scar tissue.  Objective: Vital signs in last 24 hours: Temp:  [98.1 F (36.7 C)-98.8 F (37.1 C)] 98.8 F (37.1 C) (07/07 0304) Pulse Rate:  [79-94] 94 (07/07 0304) Resp:  [17-22] 20 (07/07 0304) BP: (120-139)/(49-70) 136/70 mmHg (07/07 0304) SpO2:  [97 %-100 %] 98 % (07/07 0304) Weight:  [169 lb 9.6 oz (76.93 kg)] 169 lb 9.6 oz (76.93 kg) (07/07 0304) Weight change: -2 lb 9.3 oz (-1.17 kg) Last BM Date: 09/02/12 Intake/Output from previous day: -2326 wt 169 down from 172 yesterday 07/06 0701 - 07/07 0700 In: 886.8 [P.O.:720; I.V.:166.8] Out: 3250 [Urine:3250] Intake/Output this shift:    PE: General:Pleasant affect, NAD Neck:supple, no JVD, no bruits  Heart:irreg irreg without murmur, gallup, rub or click Lungs:clear without rales, rhonchi, or wheezes ZOX:WRUE, non tender, + BS, do not palpate liver spleen or masses Ext:no lower ext edema, 2+ pedal pulses, 2+ radial pulses Neuro:alert and oriented, MAE, follows commands, + facial symmetry   Lab Results: No results found for this basename: WBC, HGB, HCT, PLT,  in the last 72 hours BMET  Recent Labs  09/02/12 0645 09/03/12 0425  NA 133* 133*  K 3.3* 4.4  CL 100 105  CO2 22 25  GLUCOSE 132* 120*  BUN 21 17  CREATININE 1.06 0.97  CALCIUM 7.6* 7.7*   No results found for this basename: TROPONINI, CK, MB,  in the last 72 hours  Lab Results  Component Value Date   CHOL 156 07/11/2012   HDL 41 07/11/2012   LDLCALC 84 07/11/2012   TRIG 156* 07/11/2012   CHOLHDL 3.8 07/11/2012   Lab Results  Component Value Date   HGBA1C 5.5 08/28/2012     Lab Results  Component Value Date   TSH 1.366 08/08/2012      Studies/Results: No results found.  Medications: I have reviewed the patient's current medications. Scheduled Meds: . amiodarone  400 mg Oral BID  . aspirin EC  325 mg Oral Daily  . famotidine  20 mg Oral BID  .  fesoterodine  8 mg Oral Daily  . furosemide  40 mg Oral Daily  . nystatin   Topical BID  . potassium chloride  40 mEq Oral Daily   Continuous Infusions:  PRN Meds:.acetaminophen, hydrOXYzine  Assessment/Plan: Principal Problem:   S/P aortic valve replacement with bioprosthetic valve Active Problems:   Aortic valve stenosis, critical   PAF (paroxysmal atrial fibrillation)   HTN (hypertension)   Normal coronary arteries-6/14   COPD (chronic obstructive pulmonary disease)- noted on CXR 08/08/12  PLAN:  POD # 5 Continues to be in and out atrial fib-currently in.  Rate mostly controlled. Is on po amiodarone 400 mg BID.  BP stable ? Add low dose BB Add stool softner  LOS: 5 days   Time spent with pt. :15 minutes. [facility] R  Nurse Practitioner Certified Pager [phone] 09/04/2012, 8:29 AM   Agree with note written by Nada Boozer RNP  POD #5 Bioprosthetic AVR. PAF on PO Amio. Otherwise looks good. Exam benign. C/O cough and constipation. CRH. Agree with adding low dose BB. If continues for have PAF will prob need to be on [facility].  Runell Gess 09/04/2012 8:57 AM

## 2012-09-04 NOTE — Progress Notes (Addendum)
      [address]       [name] [address]             [phone]      5 Days Post-Op Procedure(s) (LRB): AORTIC VALVE REPLACEMENT (AVR) (N/A) INTRAOPERATIVE TRANSESOPHAGEAL ECHOCARDIOGRAM (N/A)  Subjective:  Patient complains of constipation this morning.  Requesting stool softeners.    Objective: Vital signs in last 24 hours: Temp:  [98.1 F (36.7 C)-98.8 F (37.1 C)] 98.8 F (37.1 C) (07/07 0304) Pulse Rate:  [79-94] 94 (07/07 0304) Cardiac Rhythm:  [-] Normal sinus rhythm (07/06 1945) Resp:  [17-22] 20 (07/07 0304) BP: (120-139)/(49-70) 136/70 mmHg (07/07 0304) SpO2:  [97 %-100 %] 98 % (07/07 0304) Weight:  [169 lb 9.6 oz (76.93 kg)] 169 lb 9.6 oz (76.93 kg) (07/07 0304)  Intake/Output from previous day: 07/06 0701 - 07/07 0700 In: 886.8 [P.O.:720; I.V.:166.8] Out: 3250 [Urine:3250] Intake/Output this shift: Total I/O In: 120 [P.O.:120] Out: -   General appearance: alert, cooperative and no distress Heart: irregularly irregular rhythm Lungs: clear to auscultation bilaterally Abdomen: soft, non-tender; bowel sounds normal; no masses,  no organomegaly Extremities: edema trace Wound: clean and dry  Lab Results: No results found for this basename: WBC, HGB, HCT, PLT,  in the last 72 hours BMET:  Recent Labs  09/02/12 0645 09/03/12 0425  NA 133* 133*  K 3.3* 4.4  CL 100 105  CO2 22 25  GLUCOSE 132* 120*  BUN 21 17  CREATININE 1.06 0.97  CALCIUM 7.6* 7.7*    PT/INR: No results found for this basename: LABPROT, INR,  in the last 72 hours ABG    Component Value Date/Time   PHART 7.350 08/31/2012 0125   HCO3 23.1 08/31/2012 0125   TCO2 22 08/31/2012 1653   ACIDBASEDEF 3.0* 08/31/2012 0125   O2SAT 99.0 08/31/2012 0125   CBG (last 3)   Recent Labs  09/01/12 1206 09/03/12 1632  GLUCAP 128* 113*    Assessment/Plan: S/P Procedure(s) (LRB): AORTIC VALVE REPLACEMENT (AVR) (N/A) INTRAOPERATIVE TRANSESOPHAGEAL ECHOCARDIOGRAM (N/A)  1.  CV- rate controlled Atrial Fibrillation- on Amiodarone Hypotension improved, will try to add low dose Beta Blocker 2. Pulm- no acute issues 3. GI- constipation- will continue stool softeners, add Lactulose prn 4. Dispo- patient stable, if continues to have A. Fib may need to start low dose Coumadin, continue current care   LOS: 5 days    Lowella Dandy 09/04/2012  Start coumadin 2mg  daily for intermittent Afib post AVR patient examined and medical record reviewed,agree with above note. VAN TRIGT III,Adonys Wildes 09/04/2012

## 2012-09-04 NOTE — Care Management Note (Signed)
    Page 1 of 2   09/08/2012     2:40:09 PM   CARE MANAGEMENT NOTE 09/08/2012  Patient:  [name], [name]   Account Number:  [id number]  Date Initiated:  09/04/2012  Documentation initiated by:  Malyssa Maris  Subjective/Objective Assessment:   PT S/P AVR ON 08/30/12.  PTA, PT INDEPENDENT, LIVES WITH SPOUSE.     Action/Plan:   WILL FOLLOW FOR HOME NEEDS AS PT PROGRESSES.   Anticipated DC Date:  09/08/2012   Anticipated DC Plan:  SKILLED NURSING FACILITY  In-house referral  Clinical Social Worker      DC Planning Services  CM consult      Choice offered to / List presented to:             Status of service:  Completed, signed off Medicare Important Message given?   (If response is "NO", the following Medicare IM given date fields will be blank) Date Medicare IM given:   Date Additional Medicare IM given:    Discharge Disposition:  SKILLED NURSING FACILITY  Per UR Regulation:  Reviewed for med. necessity/level of care/duration of stay  If discussed at Long Length of Stay Meetings, dates discussed:   09/07/2012    Comments:   09/08/12 Lyrick Lagrand,RN,BSN [phone] PT FOR DISCHARGE TO [facility] TODAY, PER CSW ARRANGEMENTS.  09/06/12 Jeison Delpilar,RN,BSN [phone] MET WITH PT ALONE WHILE HUSBAND WAS IN HALL SPEAKING WITH NURSE.  EXPLAINED CONCERN WE HAVE WITH PT GOING HOME WITH ONLY HUSBAND'S SUPPORT.  SHE STATES SHE KNOWS HUSBAND IS GETTING WORSE, AND SHE IS WORRIED ABOUT GOING HOME TOO. OFFERED LOOKING INTO POSSIBLE ST-SNF FOR PT FOR REHAB AT DISCHARGE.  PT IS AGREEABLE TO THIS PLAN, AS SHE STATES, "I CAN'T TAKE CARE OF HIM TOO."  PT LIVES NEAR [facility], AND IS INTERESTED IN THIS FACILITY.  WILL FOLLOW.  09/06/12 Chicquita Mendel,RN,BSN [phone] PT'S HUSBAND CONFUSED LAST PM; SPENT THE NIGHT IN PT'S ROOM.  RN STATES HUSBAND URINATED ON THE FLOOR, AND WANDERED IN OTHER PATIENT'S ROOMS DURING THE NIGHT.  WIFE STATES HUSBAND HAS DEMENTIA.  STAFF CONCERNED THAT HUSBAND MAY NOT  BE ABLE TO CARE FOR PT AT HOME.  SPOKE WITH PT'S DAUGHTER, KIMI FRIDDLE, BY PHONE [phone]); SHE STATES THAT SHE KNOWS  THAT HER DAD HAS SOME DEMENTIA, BUT "THINKS" HE WILL BE ABLE TO TAKE CARE OF HER MOM AT DC. SHE STATES HE WAS ABLE TO CARE FOR HER 2 YEARS AGO WHEN SHE HAD HIP SURGERY.  DAUGHTER PLANS TO TAKE HUSBAND HOME THIS EVENING TO TAKE A SHOWER.  ADVISED DTR TO LEAVE HUSBAND AT HOME TO GET SOME REST, AND NOT TO BRING HIM BACK TO HOSPITAL, AS HE IS LIKELY SLEEP-DEPRIVED.  DAUGHTER STATES SHE AND HER BROTHER CAN HELP OUT SOME, BUT ONLY IN THE EVENINGS AFTER WORK.  SHE STATES SHE THINKS THEY WILL BE OK.  09/05/12 Dariyon Urquilla,RN,BSN [phone] PT PROGRESSING WELL.  STATES SHE HASW RW AT HOME.

## 2012-09-05 ENCOUNTER — Ambulatory Visit: Payer: Self-pay | Admitting: Internal Medicine

## 2012-09-05 LAB — GLUCOSE, CAPILLARY
Glucose-Capillary: 142 mg/dL — ABNORMAL HIGH (ref 70–99)
Glucose-Capillary: 132 mg/dL — ABNORMAL HIGH (ref 70–99)
Glucose-Capillary: 109 mg/dL — ABNORMAL HIGH (ref 70–99)
Glucose-Capillary: 90 mg/dL (ref 70–99)
Glucose-Capillary: 92 mg/dL (ref 70–99)
Glucose-Capillary: 113 mg/dL — ABNORMAL HIGH (ref 70–99)
Glucose-Capillary: 111 mg/dL — ABNORMAL HIGH (ref 70–99)

## 2012-09-05 LAB — PROTIME-INR
INR: 1.1 (ref 0.00–1.49)
Prothrombin Time: 14 seconds (ref 11.6–15.2)

## 2012-09-05 MED ORDER — MAGNESIUM HYDROXIDE 400 MG/5ML PO SUSP
15.0000 mL | Freq: Every day | ORAL | Status: DC
  Administered 2012-09-06 – 2012-09-07 (×2): 15 mL via ORAL
  Filled 2012-09-05 (×4): qty 30

## 2012-09-05 MED ORDER — BISACODYL 10 MG RE SUPP
10.0000 mg | Freq: Every day | RECTAL | Status: DC | PRN
  Administered 2012-09-05 – 2012-09-07 (×3): 10 mg via RECTAL
  Filled 2012-09-05 (×3): qty 1

## 2012-09-05 NOTE — Progress Notes (Signed)
[facility]  Subjective: Pt feels a lot better today. She had a BM yesterday. No further constipation. No chest pain. Has been ambulating with cardiac rehab with little difficulty.  Objective: Vital signs in last 24 hours: Temp:  [97.8 F (36.6 C)-98.9 F (37.2 C)] 98.9 F (37.2 C) (07/08 0512) Pulse Rate:  [80-104] 80 (07/08 0512) Resp:  [18] 18 (07/08 0512) BP: (105-130)/(51-70) 105/51 mmHg (07/08 0512) SpO2:  [95 %-98 %] 95 % (07/08 0512) Weight:  [166 lb (75.297 kg)] 166 lb (75.297 kg) (07/08 0445) Last BM Date: 09/04/12  Intake/Output from previous day: 07/07 0701 - 07/08 0700 In: 360 [P.O.:360] Out: -  Intake/Output this shift:    Medications Current Facility-Administered Medications  Medication Dose Route Frequency Provider Last Rate Last Dose  . acetaminophen (TYLENOL) tablet 650 mg  650 mg Oral Q6H PRN Alleen Borne, MD      . amiodarone (PACERONE) tablet 400 mg  400 mg Oral BID Alleen Borne, MD   400 mg at 09/04/12 2203  . aspirin EC tablet 325 mg  325 mg Oral Daily Alleen Borne, MD   325 mg at 09/04/12 0949  . bisacodyl (DULCOLAX) suppository 10 mg  10 mg Rectal Daily PRN [name] [name], PA-C      . docusate sodium (COLACE) capsule 100 mg  100 mg Oral BID Nada Boozer, NP   100 mg at 09/04/12 2204  . famotidine (PEPCID) tablet 20 mg  20 mg Oral BID Alleen Borne, MD   20 mg at 09/04/12 2203  . fesoterodine (TOVIAZ) tablet 8 mg  8 mg Oral Daily Purcell Nails, MD   8 mg at 09/04/12 0949  . furosemide (LASIX) tablet 40 mg  40 mg Oral Daily Alleen Borne, MD   40 mg at 09/04/12 0949  . hydrOXYzine (ATARAX/VISTARIL) tablet 25 mg  25 mg Oral Q6H PRN Chrystie Nose, MD   25 mg at 08/31/12 1712  . lactulose (CHRONULAC) 10 GM/15ML solution 30 g  30 g Oral Daily PRN [name] [name], PA-C   30 g at 09/04/12 1715  . magnesium hydroxide (MILK OF MAGNESIA) suspension 15 mL  15 mL Oral QHS [name] [name], PA-C      . metoprolol tartrate  (LOPRESSOR) tablet 12.5 mg  12.5 mg Oral BID [name] [name], PA-C   12.5 mg at 09/04/12 2204  . nystatin (MYCOSTATIN/NYSTOP) topical powder   Topical BID Chrystie Nose, MD      . potassium chloride SA (K-DUR,KLOR-CON) CR tablet 40 mEq  40 mEq Oral Daily Alleen Borne, MD   40 mEq at 09/04/12 0949  . warfarin (COUMADIN) tablet 2 mg  2 mg Oral q1800 Kerin Perna, MD   2 mg at 09/04/12 1821  . Warfarin - Physician Dosing Inpatient   Does not apply [id number] Purcell Nails, MD        PE: General appearance: alert, cooperative and no distress Lungs: clear to auscultation bilaterally Heart: regular rate and rhythm Extremities: no LEE Pulses: 2+ and symmetric Skin: warm and dry Neurologic: Grossly normal  Lab Results:  No results found for this basename: WBC, HGB, HCT, PLT,  in the last 72 hours BMET  Recent Labs  09/03/12 0425  NA 133*  K 4.4  CL 105  CO2 25  GLUCOSE 120*  BUN 17  CREATININE 0.97  CALCIUM 7.7*   PT/INR  Recent Labs  09/05/12 0520  LABPROT 14.0  INR 1.10  Assessment/Plan    Principal Problem:   S/P aortic valve replacement with bioprosthetic valve Active Problems:   HTN (hypertension)   Normal coronary arteries-6/14   COPD (chronic obstructive pulmonary disease)- noted on CXR 08/08/12   Aortic valve stenosis, critical   PAF (paroxysmal atrial fibrillation)  Plan: POD #6 Bioprosthetic AVR. Progressing well. No chest pain. Has been ambulating with cardiac rehab with little difficulty. She had some post-op a-fib but currently  in NSR. BP and HR both stable. Continue on Amiodarone and BB. Warfarin for AC. INR is subtherapeutic at 1.10. Will continue to follow along.   LOS: 6 days    Brittainy M. Delmer Islam 09/05/2012 9:08 AM  Agree with note written by Boyce Medici  [facility]  Looks great!! POD # 6 Bioprosthetic AVR. PAF, currently in NSR on amio/BB. Exam benign. Labs OK. Nl progression. Prob home mid week. On coumadin AC. INR sub  therapeutic.   Runell Gess 09/05/2012 11:11 AM

## 2012-09-05 NOTE — Progress Notes (Signed)
Patient ambulated 800 ft using rolling walker with assist. Returned to bedside chair. Call bell near.[name]

## 2012-09-05 NOTE — Progress Notes (Signed)
      [address]       [name] [address]             [phone]      6 Days Post-Op Procedure(s) (LRB): AORTIC VALVE REPLACEMENT (AVR) (N/A) INTRAOPERATIVE TRANSESOPHAGEAL ECHOCARDIOGRAM (N/A)  Subjective:  Ms. Nest states she is feeling better this morning.  She had a BM last night, but is requesting Milk of Mag which she takes nightly at home and a suppository.  She did not ambulate much yesterday due to not feeling well.   Objective: Vital signs in last 24 hours: Temp:  [97.8 F (36.6 C)-98.9 F (37.2 C)] 98.9 F (37.2 C) (07/08 0512) Pulse Rate:  [80-104] 80 (07/08 0512) Cardiac Rhythm:  [-] Atrial fibrillation (07/07 1920) Resp:  [18] 18 (07/08 0512) BP: (105-130)/(51-70) 105/51 mmHg (07/08 0512) SpO2:  [95 %-98 %] 95 % (07/08 0512) Weight:  [166 lb (75.297 kg)] 166 lb (75.297 kg) (07/08 0445)  Intake/Output from previous day: 07/07 0701 - 07/08 0700 In: 120 [P.O.:120] Out: -   General appearance: alert, cooperative and no distress Heart: regular rate and rhythm Lungs: clear to auscultation bilaterally Abdomen: soft, non-tender; bowel sounds normal; no masses,  no organomegaly Extremities: extremities normal, atraumatic, no cyanosis or edema Wound: clean and dry  Lab Results: No results found for this basename: WBC, HGB, HCT, PLT,  in the last 72 hours BMET:  Recent Labs  09/03/12 0425  NA 133*  K 4.4  CL 105  CO2 25  GLUCOSE 120*  BUN 17  CREATININE 0.97  CALCIUM 7.7*    PT/INR:  Recent Labs  09/05/12 0520  LABPROT 14.0  INR 1.10   ABG    Component Value Date/Time   PHART 7.350 08/31/2012 0125   HCO3 23.1 08/31/2012 0125   TCO2 22 08/31/2012 1653   ACIDBASEDEF 3.0* 08/31/2012 0125   O2SAT 99.0 08/31/2012 0125   CBG (last 3)   Recent Labs  09/03/12 1632  GLUCAP 113*    Assessment/Plan: S/P Procedure(s) (LRB): AORTIC VALVE REPLACEMENT (AVR) (N/A) INTRAOPERATIVE TRANSESOPHAGEAL ECHOCARDIOGRAM (N/A)  1. CV- Previous  Atrial Fibrillation, currently NSR this morning- on Lopressor, Amiodarone 2. Pulm- + dry cough, continues IS 3. Renal- volume status stable, weight is up 5 lbs since admission, on Lasix, will repeat BMET in AM 4. INR 1.10- continue Coumadin at 2 mg daily, if maintains NSR can hopefully d/c coumadin 5. Dispo- patient progressing well, will monitor heart rhythm hopefully wont need Coumadin at discharge, d/c EPW, repeat labs, CXR in AM  LOS: 6 days    Raford Pitcher, [facility] 09/05/2012

## 2012-09-05 NOTE — Progress Notes (Signed)
Pt Converted to Sinus Rhythm @ 01.00 am.

## 2012-09-05 NOTE — Progress Notes (Signed)
CARDIAC REHAB PHASE I   PRE:  Rate/Rhythm: 87SR  BP:  Supine:   Sitting: 106/63  Standing:    SaO2: 97%RA  MODE:  Ambulation: 550 ft   POST:  Rate/Rhythm: 93 SR  BP:  Supine:   Sitting: 135/54  Standing:    SaO2: 97%RA 0940-1035 Pt feeling better today. Walked 550 ft with rolling walker and asst x 1 with steady gait. Tolerated well. To recliner after walk with call bell. Encouraged 2 more walks today. Pt states she needs to have a BM every day or she gets really sick. States she will walk more today as long as she does not feel sick. Discussed importance of walking to assist with bowel function.   Luetta Nutting, RN BSN  09/05/2012 10:31 AM

## 2012-09-05 NOTE — Discharge Summary (Signed)
[address]       [name] [address]             [phone]              Discharge Summary  Name: [name] DOB: 1932/02/02 79 y.o. MRN: [id number]   Admission Date: 08/30/2012 Discharge Date:     Admitting Diagnosis: Severe aortic stenosis   Discharge Diagnosis:  Severe aortic stenosis Postoperative atrial fibrillation Expected postoperative blood loss anemia  Past Medical History  Diagnosis Date  . Arthritis   . Hyperlipidemia   . Heart murmur   . Hearing loss   . Change in voice   . Osteoporosis   . Cancer     Breast  . Aortic valve disorder 08/13/2011    ECHO - EF >55%; mild diastolic dysfunction; calcified aortic valve, not well visualized; mod/severe aortic stenosis w/ worsening gradients when compared to 2012  . PVD (peripheral vascular disease) 08/13/2010    R/P MV - normal pattern of perfusion in all regions, EF 76%; no significant wall abnormalities noted; normal perfusion study  . Carotid bruit 08/06/2008    Doppler - R ECA demonstrates noarrowing w/ elevated velocities consistent w/ >70% diameter reduction; R and L ICAs show no evidence of diameter reduction, significant tortuosity or vascular abnormality;   . Aortic stenosis 07/20/2012    Aortic stenosis   . Hypertension     dr Allyson Sabal  . S/P aortic valve replacement with bioprosthetic valve 08/30/2012    21 mm [facility] Ease bovine pericardial tissue valve  . PAF (paroxysmal atrial fibrillation) 09/04/2012      Procedures: AORTIC VALVE REPLACEMENT (21 mm [facility] Ease pericardial tissue valve) - 08/30/2012    HPI:  The patient is a 79 y.o. female with known history of aortic stenosis and hypertension who has been followed by Dr. Allyson Sabal for several years. Her last echocardiogram demonstrated moderate to severe aortic stenosis with peak and mean transvalvular gradients estimated 57 and 35 mm mercury respectively with aortic valve area estimated 0.83 cm. At that  time, the patient was asymptomatic. Over the past year, the patient has developed progressive symptoms of exertional shortness of breath and substernal chest tightness radiating up to her throat and jaw. Symptoms are always brought on with physical exertion and relieved by rest. The patient has had some mild dizzy spells without syncope. She denies any resting chest pain or shortness of breath. She denies any history of PND, orthopnea, or lower extremity edema. She recently underwent a followup echocardiogram which demonstrated the presence of critical aortic stenosis with peak velocity across the valve measuring 5 m/sec, corresponding to peak and mean transvalvular gradients, estimated at 102 and 63 mm mercury respectively. Left ventricular size and systolic function remains normal with ejection fraction estimated 65-70%. The patient underwent elective cardiac catheterization by Dr. Allyson Sabal which confirmed the presence of critical aortic stenosis with peak to peak valve gradient measured 63 mm mercury and valve area estimated to be 0.37 cm2 and no significant coronary artery disease. Pulmonary artery pressures measured 34/16 with pulmonary catheter wedge pressure 18. Cardiothoracic surgical consultation was requested, and the patient was seen by Dr. Cornelius Moras. It was felt that she would benefit from aortic valve replacement. All risks, benefits and alternatives of surgery were explained in detail, and the patient agreed to proceed.    Hospital Course:  The patient was admitted to [facility] on 08/30/2012. The patient was taken to the  operating room and underwent the above procedure.    The postoperative course was notable for atrial fibrillation which was treated with Amiodarone. She converted to sinus rhythm, but has had intermittent bursts of a-fib, and has been started on Coumadin. She has had issues with hypotension, initially requiring pressor agents. Her blood pressures have slowly improved and she has been  started on a low dose beta blocker.  She has been mildly volume overloaded, and is being gently diuresed. She is ambulating with cardiac rehab and is progressing well with mobility.  She is tolerating a regular diet. Her rhythm will be observed closely, and if she remains in sinus, we hope to discontinue Coumadin. We anticipate discharge in 24-48 hours provided no acute changes occur.    Recent vital signs:  Filed Vitals:   09/05/12 0512  BP: 105/51  Pulse: 80  Temp: 98.9 F (37.2 C)  Resp: 18    Recent laboratory studies:  CBC:No results found for this basename: WBC, HGB, HCT, PLT,  in the last 72 hours BMET:  Recent Labs  09/03/12 0425  NA 133*  K 4.4  CL 105  CO2 25  GLUCOSE 120*  BUN 17  CREATININE 0.97  CALCIUM 7.7*    PT/INR:  Recent Labs  09/05/12 0520  LABPROT 14.0  INR 1.10     Discharge Medications:     Medication List    STOP taking these medications       hydrochlorothiazide 25 MG tablet  Commonly known as:  HYDRODIURIL     metoprolol succinate 25 MG 24 hr tablet  Commonly known as:  TOPROL-XL     mupirocin ointment 2 %  Commonly known as:  BACTROBAN     zolpidem 10 MG tablet  Commonly known as:  AMBIEN      TAKE these medications       acetaminophen 325 MG tablet  Commonly known as:  TYLENOL  Take 2 tablets (650 mg total) by mouth every 6 (six) hours as needed.     amiodarone 400 MG tablet  Commonly known as:  PACERONE  Take 1 tablet (400 mg total) by mouth 2 (two) times daily. For 7 days, then once daily     aspirin 81 MG EC tablet  Take 1 tablet (81 mg total) by mouth daily.     Biotin 5000 MCG Caps  Take 1 capsule by mouth daily.     CITRACAL + D PO  Take 1 tablet by mouth 2 (two) times daily.     DSS 100 MG Caps  Take 100 mg by mouth 2 (two) times daily as needed for constipation.     fexofenadine 180 MG tablet  Commonly known as:  ALLEGRA  Take 180 mg by mouth daily as needed (for allergies).     hydrOXYzine 25 MG  tablet  Commonly known as:  ATARAX/VISTARIL  Take 25 mg by mouth as needed for itching.     metoprolol tartrate 25 MG tablet  Commonly known as:  LOPRESSOR  Take 1 tablet (25 mg total) by mouth 3 (three) times daily.     omeprazole 20 MG capsule  Commonly known as:  PRILOSEC  TAKE 2 CAPSULES BY MOUTH DAILY FOR REFLUX     PHILLIPS MILK OF MAGNESIA PO  Take 15 mLs by mouth at bedtime.     shark liver oil-cocoa butter 0.25-3-85.5 % suppository  Commonly known as:  PREPARATION H  Place 1 suppository rectally as needed for hemorrhoids.  TOVIAZ 8 MG Tb24  Generic drug:  fesoterodine  Take 8 mg by mouth daily.     traMADol 50 MG tablet  Commonly known as:  ULTRAM  Take 1 tablet (50 mg total) by mouth every 6 (six) hours as needed.     triamcinolone 55 MCG/ACT nasal inhaler  Commonly known as:  NASACORT  Place 2 sprays into the nose daily as needed (for allergies).     vitamin C 500 MG tablet  Commonly known as:  ASCORBIC ACID  Take 500 mg by mouth daily.     vitamin E 400 UNIT capsule  Take 400 Units by mouth daily.     warfarin 4 MG tablet  Commonly known as:  COUMADIN  Take 1 tablet (4 mg total) by mouth daily at 6 PM. As directed         Discharge Instructions:  The patient is to refrain from driving, heavy lifting or strenuous activity.  May shower daily and clean incisions with soap and water.  May resume regular diet.     Follow Up: Follow-up Information   Follow up with Runell Gess, MD. Schedule an appointment as soon as possible for a visit in 2 weeks.   Contact information:   [address] [place] [address] [phone]       Follow up with Purcell Nails, MD In 3 weeks. (Office will contact you with an appointment)    Contact information:   [address] [employer] [address] [place] [address] [phone]           Adella Hare 09/05/2012, 8:40 AM   Addendum: The patient is maintaining sinus rhythm. Dr. Cornelius Moras does  wish to continue Coumadin at this time. Gershon Crane PA-C

## 2012-09-06 ENCOUNTER — Inpatient Hospital Stay (HOSPITAL_COMMUNITY): Payer: Medicare Other

## 2012-09-06 LAB — CBC
HCT: 29.8 % — ABNORMAL LOW (ref 36.0–46.0)
Hemoglobin: 10.1 g/dL — ABNORMAL LOW (ref 12.0–15.0)
MCH: 30.8 pg (ref 26.0–34.0)
MCHC: 33.9 g/dL (ref 30.0–36.0)
MCV: 90.9 fL (ref 78.0–100.0)
Platelets: 248 10*3/uL (ref 150–400)
RBC: 3.28 MIL/uL — ABNORMAL LOW (ref 3.87–5.11)
RDW: 13.8 % (ref 11.5–15.5)
WBC: 9 10*3/uL (ref 4.0–10.5)

## 2012-09-06 LAB — BASIC METABOLIC PANEL
BUN: 13 mg/dL (ref 6–23)
CO2: 27 mEq/L (ref 19–32)
Calcium: 8.6 mg/dL (ref 8.4–10.5)
Chloride: 101 mEq/L (ref 96–112)
Creatinine, Ser: 0.93 mg/dL (ref 0.50–1.10)
GFR calc Af Amer: 66 mL/min — ABNORMAL LOW (ref >90)
GFR calc non Af Amer: 57 mL/min — ABNORMAL LOW (ref >90)
Glucose, Bld: 114 mg/dL — ABNORMAL HIGH (ref 70–99)
Potassium: 4 mEq/L (ref 3.5–5.1)
Sodium: 136 mEq/L (ref 135–145)

## 2012-09-06 LAB — PROTIME-INR
INR: 1.1 (ref 0.00–1.49)
Prothrombin Time: 14 seconds (ref 11.6–15.2)

## 2012-09-06 MED ORDER — ALBUTEROL SULFATE (5 MG/ML) 0.5% IN NEBU: 2.5000 mg | INHALATION_SOLUTION | Freq: Four times a day (QID) | RESPIRATORY_TRACT | Status: DC

## 2012-09-06 MED ORDER — TRAMADOL HCL 50 MG PO TABS: 50.0000 mg | ORAL_TABLET | Freq: Four times a day (QID) | ORAL | Status: DC | PRN

## 2012-09-06 MED ORDER — METOPROLOL TARTRATE 25 MG PO TABS
25.0000 mg | ORAL_TABLET | Freq: Two times a day (BID) | ORAL | Status: DC
  Administered 2012-09-06 – 2012-09-07 (×2): 25 mg via ORAL
  Filled 2012-09-06 (×5): qty 1

## 2012-09-06 MED ORDER — POTASSIUM CHLORIDE CRYS ER 20 MEQ PO TBCR
20.0000 meq | EXTENDED_RELEASE_TABLET | Freq: Two times a day (BID) | ORAL | Status: DC
  Administered 2012-09-06 – 2012-09-08 (×5): 20 meq via ORAL
  Filled 2012-09-06 (×6): qty 1

## 2012-09-06 MED ORDER — FUROSEMIDE 10 MG/ML IJ SOLN
40.0000 mg | Freq: Two times a day (BID) | INTRAMUSCULAR | Status: AC
  Administered 2012-09-06 (×2): 40 mg via INTRAVENOUS
  Filled 2012-09-06 (×2): qty 4

## 2012-09-06 MED ORDER — ASPIRIN EC 81 MG PO TBEC
81.0000 mg | DELAYED_RELEASE_TABLET | Freq: Every day | ORAL | Status: DC
  Administered 2012-09-06 – 2012-09-08 (×3): 81 mg via ORAL
  Filled 2012-09-06 (×3): qty 1

## 2012-09-06 MED ORDER — FUROSEMIDE 40 MG PO TABS
40.0000 mg | ORAL_TABLET | Freq: Two times a day (BID) | ORAL | Status: DC
  Filled 2012-09-06 (×3): qty 1

## 2012-09-06 MED ORDER — WARFARIN VIDEO
Freq: Once | Status: AC
  Administered 2012-09-06: 18:00:00

## 2012-09-06 MED ORDER — COUMADIN BOOK
Freq: Once | Status: AC
  Administered 2012-09-06: 18:00:00
  Filled 2012-09-06: qty 1

## 2012-09-06 MED ORDER — WARFARIN SODIUM 4 MG PO TABS
4.0000 mg | ORAL_TABLET | Freq: Every day | ORAL | Status: DC
  Administered 2012-09-06 – 2012-09-07 (×2): 4 mg via ORAL
  Filled 2012-09-06 (×3): qty 1

## 2012-09-06 NOTE — Progress Notes (Addendum)
      [address]       [name] [address]             [phone]      7 Days Post-Op Procedure(s) (LRB): AORTIC VALVE REPLACEMENT (AVR) (N/A) INTRAOPERATIVE TRANSESOPHAGEAL ECHOCARDIOGRAM (N/A)  Subjective:  [name] complains of right sided neck and ear pain.  She states she sat in the chair too long yesterday waiting for someone to come back and walk her.  She states that cold air blew on that side all day and now it hurts.  She is ambulating with minimal difficulty +BM  Objective: Vital signs in last 24 hours: Temp:  [97.2 F (36.2 C)-98.7 F (37.1 C)] 98.7 F (37.1 C) (07/09 0510) Pulse Rate:  [84-92] 92 (07/09 0510) Cardiac Rhythm:  [-] Normal sinus rhythm (07/08 2000) Resp:  [18-20] 20 (07/09 0510) BP: (106-147)/(63-88) 147/78 mmHg (07/09 0510) SpO2:  [96 %-100 %] 96 % (07/09 0510) Weight:  [165 lb 9.1 oz (75.1 kg)] 165 lb 9.1 oz (75.1 kg) (07/09 0510)  Intake/Output from previous day: 07/08 0701 - 07/09 0700 In: 600 [P.O.:600] Out: -   General appearance: alert, cooperative and no distress Heart: irregularly irregular rhythm Lungs: diminished breath sounds bibasilar Abdomen: soft, non-tender; bowel sounds normal; no masses,  no organomegaly Extremities: edema trace Wound: clean and dry  Lab Results:  Recent Labs  09/06/12 0445  WBC 9.0  HGB 10.1*  HCT 29.8*  PLT 248   BMET:  Recent Labs  09/06/12 0445  NA 136  K 4.0  CL 101  CO2 27  GLUCOSE 114*  BUN 13  CREATININE 0.93  CALCIUM 8.6    PT/INR:  Recent Labs  09/06/12 0445  LABPROT 14.0  INR 1.10   ABG    Component Value Date/Time   PHART 7.350 08/31/2012 0125   HCO3 23.1 08/31/2012 0125   TCO2 22 08/31/2012 1653   ACIDBASEDEF 3.0* 08/31/2012 0125   O2SAT 99.0 08/31/2012 0125   CBG (last 3)   Recent Labs  09/03/12 1632  GLUCAP 113*    Assessment/Plan: S/P Procedure(s) (LRB): AORTIC VALVE REPLACEMENT (AVR) (N/A) INTRAOPERATIVE TRANSESOPHAGEAL ECHOCARDIOGRAM  (N/A)  1. CV- Episodes of Atrial Fibrillation this morning- on Amiodarone, will increase Lopressor to 25 mg BID 2. INR 1.10- will increase Coumadin to 4 mg daily 3. Renal- creatinine, lytes okay- remains mildly volume overloaded, weight up 5 lbs- continue Lasix 4. Pulm- CXR with increased pleural effusion from previous film on left, bilateral atelectasis, encouraged use of IS, will order flutter valve, may benefit from Nebulizer treatments 5. Dispo- patient with continued episodes of Atrial Fibrillation, will increase Coumadin, titrate Lopressor, continue current care   LOS: 7 days    [name], [name] 09/06/2012  I have seen and examined the patient and agree with the assessment and plan as outlined.  Back in rate-controlled Afib.  Will give lasix IV today.  Hold plans for d/c until rhythm and fluid overload more stable.  Cynthea Zachman H 09/06/2012 8:28 AM

## 2012-09-06 NOTE — Progress Notes (Signed)
Notified by CCMD pt back in A Fib. MD at bedside when notified, MD aware. Will continue to monitor.

## 2012-09-06 NOTE — Progress Notes (Signed)
[facility]  Subjective: No chest pain. Still some SOB.   Objective: Vital signs in last 24 hours: Temp:  [97.2 F (36.2 C)-98.7 F (37.1 C)] 98.7 F (37.1 C) (07/09 0510) Pulse Rate:  [87-92] 92 (07/09 0510) Resp:  [18-20] 20 (07/09 0510) BP: (124-147)/(71-88) 147/78 mmHg (07/09 0510) SpO2:  [96 %-100 %] 96 % (07/09 0510) Weight:  [165 lb 9.1 oz (75.1 kg)] 165 lb 9.1 oz (75.1 kg) (07/09 0510) Last BM Date: 09/05/12  Intake/Output from previous day: 07/08 0701 - 07/09 0700 In: 840 [P.O.:840] Out: -  Intake/Output this shift:    Medications Current Facility-Administered Medications  Medication Dose Route Frequency Provider Last Rate Last Dose  . acetaminophen (TYLENOL) tablet 650 mg  650 mg Oral Q6H PRN Alleen Borne, MD   650 mg at 09/06/12 0820  . amiodarone (PACERONE) tablet 400 mg  400 mg Oral BID Alleen Borne, MD   400 mg at 09/05/12 2226  . aspirin EC tablet 81 mg  81 mg Oral Daily Purcell Nails, MD      . bisacodyl (DULCOLAX) suppository 10 mg  10 mg Rectal Daily PRN [name] [name], PA-C   10 mg at 09/05/12 1339  . docusate sodium (COLACE) capsule 100 mg  100 mg Oral BID Nada Boozer, NP   100 mg at 09/05/12 2225  . famotidine (PEPCID) tablet 20 mg  20 mg Oral BID Alleen Borne, MD   20 mg at 09/05/12 2227  . fesoterodine (TOVIAZ) tablet 8 mg  8 mg Oral Daily Purcell Nails, MD   8 mg at 09/05/12 0947  . furosemide (LASIX) injection 40 mg  40 mg Intravenous BID Purcell Nails, MD      . Melene Muller ON 09/07/2012] furosemide (LASIX) tablet 40 mg  40 mg Oral BID Purcell Nails, MD      . hydrOXYzine (ATARAX/VISTARIL) tablet 25 mg  25 mg Oral Q6H PRN Chrystie Nose, MD   25 mg at 08/31/12 1712  . lactulose (CHRONULAC) 10 GM/15ML solution 30 g  30 g Oral Daily PRN [name] [name], PA-C   30 g at 09/05/12 1338  . magnesium hydroxide (MILK OF MAGNESIA) suspension 15 mL  15 mL Oral QHS [name] [name], PA-C      . metoprolol tartrate (LOPRESSOR)  tablet 25 mg  25 mg Oral BID [name] [name], PA-C      . nystatin (MYCOSTATIN/NYSTOP) topical powder   Topical BID Chrystie Nose, MD      . potassium chloride SA (K-DUR,KLOR-CON) CR tablet 20 mEq  20 mEq Oral BID Purcell Nails, MD      . traMADol Janean Sark) tablet 50 mg  50 mg Oral Q6H PRN [name] [name], PA-C      . warfarin (COUMADIN) tablet 4 mg  4 mg Oral q1800 [name] [name], PA-C      . Warfarin - Physician Dosing Inpatient   Does not apply [id number] Purcell Nails, MD        PE: General appearance: alert, cooperative and no distress Lungs: decreased breath sounds bilaterally Heart: irregularly irregular rhythm Extremities: no LEE Pulses: 2+ and symmetric Skin: warm and dry Neurologic: Grossly normal  Lab Results:   Recent Labs  09/06/12 0445  WBC 9.0  HGB 10.1*  HCT 29.8*  PLT 248   BMET  Recent Labs  09/06/12 0445  NA 136  K 4.0  CL 101  CO2 27  GLUCOSE 114*  BUN 13  CREATININE 0.93  CALCIUM 8.6   PT/INR  Recent Labs  09/05/12 0520 09/06/12 0445  LABPROT 14.0 14.0  INR 1.10 1.10    Studies/Results:  CXR 09/06/12 *RADIOLOGY REPORT*  Clinical Data: Post CABG  CHEST - 2 VIEW  Comparison: 09/01/2012  Findings: Right jugular catheter has been removed. Negative for  pneumothorax.  Improvement in vascular congestion. Bilateral pleural effusions  and bibasilar atelectasis are present.  IMPRESSION:  Central venous catheter has been removed. No pneumothorax.  Mild improvement in vascular congestion and edema. Bilateral  effusions and bibasilar atelectasis remain.  Original Report Authenticated By: Janeece Riggers, M.D.   Assessment/Plan  Principal Problem:   S/P aortic valve replacement with bioprosthetic valve Active Problems:   HTN (hypertension)   Normal coronary arteries-6/14   COPD (chronic obstructive pulmonary disease)- noted on CXR 08/08/12   Aortic valve stenosis, critical   PAF (paroxysmal atrial fibrillation)  Plan: POD #7 Bioprosthetic AVR.  Progressing well. No chest pain. Still has mild SOB. Using incentive spirometer. PAF on telemetry with ventricular rates in the low 100s. Agree with TCTS decision to increase Lopressor dose to 25 mg BID. Continue with Amiodarone as well. INR is sub therapeutic at 1.10. Warfarin has been increased to 4 mg. CXR today demonstrates mild improvement in vascular congestion and edema. Bilateral effusion and bibasilar atelectasis remain. Continue with Lasix. Renal function is WNL. I have encouraged pt to continue with incentive spirometer. Continue ambulating with CR.    LOS: 7 days    Brittainy M. Delmer Islam 09/06/2012 10:34 AM     Patient seen and examined. Agree with assessment and plan. Day 7 s/p AVR. Back in AF on amiodarone; lopressor dose increased. Continue diuresis. Shortness of breath with walking, but O2 sats adequate. Weight 4 lbs increase from admission.   Lennette Bihari, MD, [facility] 09/06/2012 10:46 AM

## 2012-09-06 NOTE — Progress Notes (Signed)
Pt ambulated 100 ft in hallway with RN using front wheel walker. HR increased to 150's and sustained in 130's-140's. Pt stated she felt fine, but assisted back to room to rest. Pt HR low 100's at rest. Will continue to monitor.

## 2012-09-06 NOTE — Progress Notes (Signed)
CARDIAC REHAB PHASE I   PRE:  Rate/Rhythm: 86afib  BP:  Supine:   Sitting: 106/47  Standing:    SaO2: 96%RA  MODE:  Ambulation: 730 ft   POST:  Rate/Rhythm: 115afib  BP:  Supine:   Sitting: 120/71  Standing:    SaO2: 96%RA 1020-1103 Pt walked 730 ft on RA with rolling walker and asst x 1. Tolerated well. Gait steady. Stated ear hurts from sitting by the window. Asked pt if she wanted me to move recliner to the other side of room. Stated she did not like the recliner. To straight back chair after walk which is not by the window. Motivated to walk.. No c/o SOB.   Luetta Nutting, RN BSN  09/06/2012 10:58 AM

## 2012-09-07 LAB — PROTIME-INR
INR: 1.17 (ref 0.00–1.49)
Prothrombin Time: 14.7 seconds (ref 11.6–15.2)

## 2012-09-07 MED ORDER — METOPROLOL TARTRATE 25 MG PO TABS
25.0000 mg | ORAL_TABLET | Freq: Three times a day (TID) | ORAL | Status: DC
  Administered 2012-09-07 – 2012-09-08 (×3): 25 mg via ORAL
  Filled 2012-09-07 (×6): qty 1

## 2012-09-07 MED ORDER — FUROSEMIDE 10 MG/ML IJ SOLN
40.0000 mg | Freq: Two times a day (BID) | INTRAMUSCULAR | Status: AC
  Administered 2012-09-07 (×2): 40 mg via INTRAVENOUS
  Filled 2012-09-07 (×2): qty 4

## 2012-09-07 NOTE — Progress Notes (Signed)
[facility]  Subjective: No complaints. Feels well.   Objective: Vital signs in last 24 hours: Temp:  [97.2 F (36.2 C)-99.5 F (37.5 C)] 99.5 F (37.5 C) (07/10 0449) Pulse Rate:  [77-119] 77 (07/10 0449) Resp:  [18-20] 18 (07/10 0449) BP: (98-119)/(46-74) 103/65 mmHg (07/10 0449) SpO2:  [95 %-97 %] 95 % (07/10 0449) Last BM Date: 09/05/12  Intake/Output from previous day: 07/09 0701 - 07/10 0700 In: 240 [P.O.:240] Out: 2100 [Urine:2100] Intake/Output this shift:    Medications Current Facility-Administered Medications  Medication Dose Route Frequency Provider Last Rate Last Dose  . acetaminophen (TYLENOL) tablet 650 mg  650 mg Oral Q6H PRN Alleen Borne, MD   650 mg at 09/06/12 0820  . amiodarone (PACERONE) tablet 400 mg  400 mg Oral BID Alleen Borne, MD   400 mg at 09/07/12 0109  . aspirin EC tablet 81 mg  81 mg Oral Daily Purcell Nails, MD   81 mg at 09/06/12 1800  . bisacodyl (DULCOLAX) suppository 10 mg  10 mg Rectal Daily PRN [name] [name], PA-C   10 mg at 09/06/12 2032  . docusate sodium (COLACE) capsule 100 mg  100 mg Oral BID Nada Boozer, NP   100 mg at 09/06/12 2032  . famotidine (PEPCID) tablet 20 mg  20 mg Oral BID Alleen Borne, MD   20 mg at 09/07/12 0109  . fesoterodine (TOVIAZ) tablet 8 mg  8 mg Oral Daily Purcell Nails, MD   8 mg at 09/06/12 1000  . furosemide (LASIX) injection 40 mg  40 mg Intravenous BID [name] [name], PA-C      . hydrOXYzine (ATARAX/VISTARIL) tablet 25 mg  25 mg Oral Q6H PRN Chrystie Nose, MD   25 mg at 08/31/12 1712  . lactulose (CHRONULAC) 10 GM/15ML solution 30 g  30 g Oral Daily PRN [name] [name], PA-C   30 g at 09/05/12 1338  . magnesium hydroxide (MILK OF MAGNESIA) suspension 15 mL  15 mL Oral QHS [name] [name], PA-C   15 mL at 09/06/12 2031  . metoprolol tartrate (LOPRESSOR) tablet 25 mg  25 mg Oral TID Purcell Nails, MD      . nystatin (MYCOSTATIN/NYSTOP) topical powder   Topical BID Chrystie Nose, MD      . potassium chloride SA (K-DUR,KLOR-CON) CR tablet 20 mEq  20 mEq Oral BID Purcell Nails, MD   20 mEq at 09/07/12 0110  . traMADol (ULTRAM) tablet 50 mg  50 mg Oral Q6H PRN [name] [name], PA-C      . warfarin (COUMADIN) tablet 4 mg  4 mg Oral q1800 [name] [name], PA-C   4 mg at 09/06/12 1800  . Warfarin - Physician Dosing Inpatient   Does not apply q1800 Purcell Nails, MD        PE: General appearance: alert, cooperative and no distress Lungs: clear to auscultation bilaterally Heart: regular rate and rhythm Extremities: no LEE Pulses: 2+ and symmetric Skin: warm and dry Neurologic: Grossly normal  Lab Results:   Recent Labs  09/06/12 0445  WBC 9.0  HGB 10.1*  HCT 29.8*  PLT 248   BMET  Recent Labs  09/06/12 0445  NA 136  K 4.0  CL 101  CO2 27  GLUCOSE 114*  BUN 13  CREATININE 0.93  CALCIUM 8.6   PT/INR  Recent Labs  09/05/12 0520 09/06/12 0445 09/07/12 0638  LABPROT 14.0 14.0 14.7  INR 1.10 1.10  1.17    Assessment/Plan  Principal Problem:   S/P aortic valve replacement with bioprosthetic valve Active Problems:   HTN (hypertension)   Normal coronary arteries-6/14   COPD (chronic obstructive pulmonary disease)- noted on CXR 08/08/12   Aortic valve stenosis, critical   PAF (paroxysmal atrial fibrillation)  Plan: POD #8 Bioprosthetic AVR. Progressing well. Continues to have A-fib on telemetry with ventricular rates in the low 100s-90s. Her Lopressor was just increased today to 25 mg TID. Will monitor response and will continue to titrate up if needed. BP is stable. Continue with cardiac rehab. Approaching d/c in 1-2 days. Will need f/u with Dr. Allyson Sabal.       LOS: 8 days    [name] 09/07/2012 8:39 AM   Patient seen and examined. Agree with assessment and plan.Day 8 s/p AVR.  AF rate ~ 100. Lopressor now increased to 25 mg every 8 hrs. Not yet therapeutic on warfarin; ?initiate heparin or lovenox with AF until  therapeutic, will defer to Dr.Owens service.   Lennette Bihari, MD, [facility] 09/07/2012 9:47 AM

## 2012-09-07 NOTE — Progress Notes (Addendum)
      [address]       [name] [address]             [phone]      8 Days Post-Op Procedure(s) (LRB): AORTIC VALVE REPLACEMENT (AVR) (N/A) INTRAOPERATIVE TRANSESOPHAGEAL ECHOCARDIOGRAM (N/A)  Subjective:  Ms. [name] states she is feeling okay.  She states yesterday while she was walking her heart rate increased and she had to come back to her room.  She is planning on SNF for discharge stating she does not think her husband can care for her. +BM  Objective: Vital signs in last 24 hours: Temp:  [97.2 F (36.2 C)-99.5 F (37.5 C)] 99.5 F (37.5 C) (07/10 0449) Pulse Rate:  [77-119] 77 (07/10 0449) Cardiac Rhythm:  [-] Atrial fibrillation (07/09 1930) Resp:  [18-20] 18 (07/10 0449) BP: (98-119)/(46-74) 103/65 mmHg (07/10 0449) SpO2:  [95 %-97 %] 95 % (07/10 0449)  Intake/Output from previous day: 07/09 0701 - 07/10 0700 In: 240 [P.O.:240] Out: 2100 [Urine:2100]  General appearance: alert, cooperative and no distress Heart: irregularly irregular rhythm Lungs: diminished breath sounds bibasilar Abdomen: soft, non-tender; bowel sounds normal; no masses,  no organomegaly Extremities: edema trace Wound: clean and dry  Lab Results:  Recent Labs  09/06/12 0445  WBC 9.0  HGB 10.1*  HCT 29.8*  PLT 248   BMET:  Recent Labs  09/06/12 0445  NA 136  K 4.0  CL 101  CO2 27  GLUCOSE 114*  BUN 13  CREATININE 0.93  CALCIUM 8.6    PT/INR:  Recent Labs  09/07/12 0638  LABPROT 14.7  INR 1.17   ABG    Component Value Date/Time   PHART 7.350 08/31/2012 0125   HCO3 23.1 08/31/2012 0125   TCO2 22 08/31/2012 1653   ACIDBASEDEF 3.0* 08/31/2012 0125   O2SAT 99.0 08/31/2012 0125   CBG (last 3)  No results found for this basename: GLUCAP,  in the last 72 hours  Assessment/Plan: S/P Procedure(s) (LRB): AORTIC VALVE REPLACEMENT (AVR) (N/A) INTRAOPERATIVE TRANSESOPHAGEAL ECHOCARDIOGRAM (N/A)  1. CV- Atrial Fibrillation- rate in the 100s but went as  high as 150 yesterday with ambulation- on Amiodarone, Lopressor- may benefit from rebolus of Amiodarone if rate gets high again or possibly add digoxin 2. Pulm- dyspnea with ambulation, good oxygen sats- encouraged use of IS, continue nebulizers, repeat CXR in AM 3. INR 1.17- coumadin increased to 4 mg yesterday will repeat dose tonight if still minimal response will increase dose 4. Renal- remains mildly volume overloaded, weight is up about 5 lbs, IV Lasix given yesterday with 2L of U/O, will repeat IV Lasix today 5. Deconditioning- patient wants SNF at discharge, PT consult pending, Care management working with patient 6. Dispo- patient with continued Atrial Fibrillation, remains volume overloaded, continue care for now    LOS: 8 days    [name], [name] 09/07/2012  I have seen and examined the patient and agree with the assessment and plan as outlined.  Increase metoprolol for rate control.  D/C pacing wires.  Possible d/c to SNF in 1-2 days.  Shakira Los H 09/07/2012 8:03 AM

## 2012-09-07 NOTE — Progress Notes (Signed)
EPW and CT sutures D/C'd per protocol. Pt tolerated procedure well. Benzoin and steri strips applied to CT suture sites. VSS. Pt instructed about bedrest for one hour. Verbalized understanding. Will continue to monitor pt closely.

## 2012-09-07 NOTE — Progress Notes (Signed)
Clinical Social Work Department CLINICAL SOCIAL WORK PLACEMENT NOTE 09/07/2012  Patient:  [name], [name]  Account Number:  [id number] Admit date:  08/30/2012  Clinical Social Worker:  Carren Rang  Date/time:  09/07/2012 04:28 PM  Clinical Social Work is seeking post-discharge placement for this patient at the following level of care:   SKILLED NURSING   (*CSW will update this form in Epic as items are completed)   09/07/2012  Patient/family provided with [facility] Department of Clinical Social Work's list of facilities offering this level of care within the geographic area requested by the patient (or if unable, by the patient's family).  09/07/2012  Patient/family informed of their freedom to choose among providers that offer the needed level of care, that participate in Medicare, Medicaid or managed care program needed by the patient, have an available bed and are willing to accept the patient.  09/07/2012  Patient/family informed of [facility]' ownership interest in [facility], as well as of the fact that they are under no obligation to receive care at this facility.  PASARR submitted to EDS on 09/07/2012 PASARR number received from EDS on 09/07/2012  FL2 transmitted to all facilities in geographic area requested by pt/family on  09/07/2012 FL2 transmitted to all facilities within larger geographic area on   Patient informed that his/her managed care company has contracts with or will negotiate with  certain facilities, including the following:     Patient/family informed of bed offers received:   Patient chooses bed at  Physician recommends and patient chooses bed at    Patient to be transferred to  on   Patient to be transferred to facility by   The following physician request were entered in Epic:   Additional Comments:   Maree Krabbe, MSW, [employer] [phone]

## 2012-09-07 NOTE — Progress Notes (Signed)
Clinical Social Work Department BRIEF PSYCHOSOCIAL ASSESSMENT 09/07/2012  Patient:  BIRDENA, KINGMA     Account Number:  [id number]     Admit date:  08/30/2012  Clinical Social Worker:  Carren Rang  Date/Time:  09/07/2012 04:11 PM  Referred by:  Physician  Date Referred:  09/06/2012 Referred for  SNF Placement   Other Referral:   Interview type:  Other - See comment Other interview type:   Clinical Social Worker spoke with patient's daughter about SNF placements.    PSYCHOSOCIAL DATA Living Status:  HUSBAND Admitted from facility:   Level of care:   Primary support name:  Kimi Friddle Primary support relationship to patient:  FAMILY Degree of support available:   Supportive-Good    CURRENT CONCERNS Current Concerns  Post-Acute Placement   Other Concerns:    SOCIAL WORK ASSESSMENT / PLAN Clinical Social Worker received referral for SNF placement at d/c. CSW introduced self to patients daughter and explained reason for visit. CSW explained SNF process. Patients daughter stated that patients preference is [facility] due to location and husband and friends. CSW encouraged family to think about additional SNF options pending availability of preferred facility. CSW will complete FL2 for MD's signature and will update patient and family when bed offers are received.   Assessment/plan status:  Information/Referral to [facility] Other assessment/ plan:   Information/referral to community resources:   SNF packet    PATIENT'S/FAMILY'S RESPONSE TO PLAN OF CARE: Patient's daughter stated that patient's preference was [facility] because of location and husband and friends. Daughter was receptive to SNF plan and was agreeable to SNF search.       Maree Krabbe, MSW, [facility] [phone]

## 2012-09-07 NOTE — Evaluation (Signed)
Physical Therapy Evaluation Patient Details Name: [name] MRN: [id number] DOB: 09-25-1931 Today's Date: 09/07/2012 Time: 4098-1191 PT Time Calculation (min): 25 min  PT Assessment / Plan / Recommendation History of Present Illness  Patient is a 79 y/o female s/p AVR due to severe aortic stenosis.  Pt with PMH positive for arthritis, Breat CA s/p right mastecomy 7/97, PVD, HTN and THA 8/12.  Clinical Impression  Patient presents with decreased independence with mobility due to pain, decreased activity tolerance with episodes of a-fib noted during ambulation with cardiac rehab, decreased balance, decreased UE use due to sternal incision.  She will benefit from skilled PT in the acute setting to maximize independence and allow return home to independent level following STSNF stay.    PT Assessment  Patient needs continued PT services    Follow Up Recommendations  SNF    Does the patient have the potential to tolerate intense rehabilitation    N/A  Barriers to Discharge  Decreased caregiver support      Equipment Recommendations  None recommended by PT    Recommendations for Other Services   None  Frequency Min 3X/week    Precautions / Restrictions Precautions Precautions: Fall   Pertinent Vitals/Pain 4-5/10 right neck and ear      Mobility  Bed Mobility Bed Mobility: Sit to Supine Sit to Supine: 3: Mod assist;HOB flat Details for Bed Mobility Assistance: pt holding chest pillow and assist needed for getting legs into bed and to straighten trunk in bed Transfers Transfers: Stand to Sit Stand to Sit: 4: Min guard;With upper extremity assist;To bed;To elevated surface Details for Transfer Assistance: bed higher than her hips, assist for safety Ambulation/Gait Ambulation/Gait Assistance: 5: Supervision Ambulation Distance (Feet): 300 Feet Assistive device: Rolling walker Ambulation/Gait Assistance Details: slower speed with stiff movements in upper trunk Gait  Pattern: Decreased stride length;Step-through pattern Stairs: No        PT Diagnosis: Generalized weakness  PT Problem List: Decreased activity tolerance;Decreased balance;Decreased mobility;Cardiopulmonary status limiting activity;Pain;Decreased strength PT Treatment Interventions: DME instruction;Balance training;Gait training;Stair training;Functional mobility training;Patient/family education;Therapeutic activities;Therapeutic exercise     PT Goals(Current goals can be found in the care plan section) Acute Rehab PT Goals Patient Stated Goal: To go to rehab prior to returning home due to spouse unable to assist PT Goal Formulation: With patient/family Time For Goal Achievement: 09/21/12 Potential to Achieve Goals: Good  Visit Information  Last PT Received On: 09/07/12 Assistance Needed: +1 History of Present Illness: Patient is a 79 y/o female s/p AVR due to severe aortic stenosis.  Pt with PMH positive for arthritis, Breat CA s/p right mastecomy 7/97, PVD, HTN and THA 8/12.       Prior Functioning  Home Living Family/patient expects to be discharged to:: Private residence Living Arrangements: Spouse/significant other Type of Home: House Home Access: Stairs to enter [employer] of Steps: 8 Entrance Stairs-Rails: Right;Left Home Layout: One level Home Equipment: Environmental consultant - 2 wheels;Bedside commode Additional Comments: Spouse with dementia and usually she helps him, daughter unable to assist at home every day;  Hopes to go to [facility] Prior Function Level of Independence: Independent Communication Communication: No difficulties    Cognition  Cognition Arousal/Alertness: Awake/alert Behavior During Therapy: WFL for tasks assessed/performed Overall Cognitive Status: Within Functional Limits for tasks assessed    Extremity/Trunk Assessment Upper Extremity Assessment Upper Extremity Assessment: Defer to OT evaluation Lower Extremity Assessment Lower  Extremity Assessment: RLE deficits/detail;LLE deficits/detail RLE Deficits / Details: AROM WFL, hip flexion  strength 4-/5, otherwise WFL LLE Deficits / Details: AROM WFL, hip flexion strength 4-/5, otherwise WFL Cervical / Trunk Assessment Cervical / Trunk Assessment: Other exceptions Cervical / Trunk Exceptions: stiff through cervical muscles unable to turn head to right with right ear and neck pain   Balance Balance Balance Assessed: Yes Dynamic Standing Balance Dynamic Standing - Balance Support: During functional activity;Left upper extremity supported Dynamic Standing - Level of Assistance: 5: Stand by assistance Dynamic Standing - Comments: standing at sink to brush teeth prior to session with tech across the room making her bed  End of Session PT - End of Session Equipment Utilized During Treatment: Gait belt Activity Tolerance: Patient tolerated treatment well Patient left: in bed;with family/visitor present;with call bell/phone within reach Nurse Communication: Patient requests pain meds  GP     [facility] 09/07/2012, 9:36 AM Sheran Lawless, PT [phone] 09/07/2012

## 2012-09-07 NOTE — Progress Notes (Signed)
CARDIAC REHAB PHASE I   PRE:  Rate/Rhythm: 103 afib  BP:  Supine:   Sitting: 96/60  Standing:    SaO2: 97%RA  MODE:  Ambulation: 890 ft   POST:  Rate/Rhythm: 106 afib  BP:  Supine:   Sitting: 111/68  Standing:    SaO2: 96%RA 1355-1432 Pt walked 890 ft with rolling walker and asst x 1. Tolerated well. To recliner after walk. Call bell in reach. Husband in room. Gait steady, did not need to rest.   Luetta Nutting, RN BSN  09/07/2012 2:28 PM

## 2012-09-08 ENCOUNTER — Inpatient Hospital Stay (HOSPITAL_COMMUNITY): Payer: Medicare Other

## 2012-09-08 ENCOUNTER — Encounter (HOSPITAL_COMMUNITY): Payer: Self-pay | Admitting: Student

## 2012-09-08 DIAGNOSIS — Z4889 Encounter for other specified surgical aftercare: Secondary | ICD-10-CM | POA: Diagnosis not present

## 2012-09-08 DIAGNOSIS — I739 Peripheral vascular disease, unspecified: Secondary | ICD-10-CM | POA: Diagnosis not present

## 2012-09-08 DIAGNOSIS — I359 Nonrheumatic aortic valve disorder, unspecified: Secondary | ICD-10-CM | POA: Diagnosis not present

## 2012-09-08 DIAGNOSIS — R269 Unspecified abnormalities of gait and mobility: Secondary | ICD-10-CM | POA: Diagnosis not present

## 2012-09-08 DIAGNOSIS — J9819 Other pulmonary collapse: Secondary | ICD-10-CM | POA: Diagnosis not present

## 2012-09-08 DIAGNOSIS — R079 Chest pain, unspecified: Secondary | ICD-10-CM | POA: Diagnosis not present

## 2012-09-08 DIAGNOSIS — M810 Age-related osteoporosis without current pathological fracture: Secondary | ICD-10-CM | POA: Diagnosis not present

## 2012-09-08 DIAGNOSIS — N318 Other neuromuscular dysfunction of bladder: Secondary | ICD-10-CM | POA: Diagnosis not present

## 2012-09-08 DIAGNOSIS — Z7901 Long term (current) use of anticoagulants: Secondary | ICD-10-CM | POA: Diagnosis not present

## 2012-09-08 DIAGNOSIS — M6281 Muscle weakness (generalized): Secondary | ICD-10-CM | POA: Diagnosis not present

## 2012-09-08 DIAGNOSIS — I4891 Unspecified atrial fibrillation: Secondary | ICD-10-CM | POA: Diagnosis not present

## 2012-09-08 DIAGNOSIS — J984 Other disorders of lung: Secondary | ICD-10-CM | POA: Diagnosis not present

## 2012-09-08 DIAGNOSIS — R5381 Other malaise: Secondary | ICD-10-CM | POA: Diagnosis not present

## 2012-09-08 DIAGNOSIS — J90 Pleural effusion, not elsewhere classified: Secondary | ICD-10-CM | POA: Diagnosis not present

## 2012-09-08 DIAGNOSIS — R5383 Other fatigue: Secondary | ICD-10-CM | POA: Diagnosis not present

## 2012-09-08 DIAGNOSIS — I10 Essential (primary) hypertension: Secondary | ICD-10-CM | POA: Diagnosis not present

## 2012-09-08 DIAGNOSIS — R918 Other nonspecific abnormal finding of lung field: Secondary | ICD-10-CM | POA: Diagnosis not present

## 2012-09-08 DIAGNOSIS — K219 Gastro-esophageal reflux disease without esophagitis: Secondary | ICD-10-CM | POA: Diagnosis not present

## 2012-09-08 LAB — PROTIME-INR
INR: 1.25 (ref 0.00–1.49)
Prothrombin Time: 15.4 seconds — ABNORMAL HIGH (ref 11.6–15.2)

## 2012-09-08 MED ORDER — WARFARIN SODIUM 4 MG PO TABS: 4.0000 mg | ORAL_TABLET | Freq: Every day | ORAL | Status: DC

## 2012-09-08 MED ORDER — ASPIRIN 81 MG PO TBEC: 81.0000 mg | DELAYED_RELEASE_TABLET | Freq: Every day | ORAL | Status: DC

## 2012-09-08 MED ORDER — TRAMADOL HCL 50 MG PO TABS: 50.0000 mg | ORAL_TABLET | Freq: Four times a day (QID) | ORAL | Status: DC | PRN

## 2012-09-08 MED ORDER — ACETAMINOPHEN 325 MG PO TABS: 650.0000 mg | ORAL_TABLET | Freq: Four times a day (QID) | ORAL | Status: DC | PRN

## 2012-09-08 MED ORDER — AMIODARONE HCL 400 MG PO TABS: 400.0000 mg | ORAL_TABLET | Freq: Two times a day (BID) | ORAL | Status: DC

## 2012-09-08 MED ORDER — METOPROLOL TARTRATE 25 MG PO TABS: 25.0000 mg | ORAL_TABLET | Freq: Three times a day (TID) | ORAL | Status: DC

## 2012-09-08 MED ORDER — DSS 100 MG PO CAPS: 100.0000 mg | ORAL_CAPSULE | Freq: Two times a day (BID) | ORAL | Status: DC | PRN

## 2012-09-08 NOTE — Progress Notes (Signed)
Clinical Social Work Department CLINICAL SOCIAL WORK PLACEMENT NOTE 09/08/2012  Patient:  [name], [name]  Account Number:  [id number] Admit date:  08/30/2012  Clinical Social Worker:  Carren Rang  Date/time:  09/07/2012 04:28 PM  Clinical Social Work is seeking post-discharge placement for this patient at the following level of care:   SKILLED NURSING   (*CSW will update this form in Epic as items are completed)   09/07/2012  Patient/family provided with [facility] Department of Clinical Social Work's list of facilities offering this level of care within the geographic area requested by the patient (or if unable, by the patient's family).  09/07/2012  Patient/family informed of their freedom to choose among providers that offer the needed level of care, that participate in Medicare, Medicaid or managed care program needed by the patient, have an available bed and are willing to accept the patient.  09/07/2012  Patient/family informed of [facility]' ownership interest in [facility], as well as of the fact that they are under no obligation to receive care at this facility.  PASARR submitted to EDS on 09/07/2012 PASARR number received from EDS on 09/07/2012  FL2 transmitted to all facilities in geographic area requested by pt/family on  09/07/2012 FL2 transmitted to all facilities within larger geographic area on   Patient informed that his/her managed care company has contracts with or will negotiate with  certain facilities, including the following:     Patient/family informed of bed offers received:  09/07/2012 Patient chooses bed at [facility] Physician recommends and patient chooses bed at    Patient to be transferred to [facility] on  09/08/2012 Patient to be transferred to facility by EMS  The following physician request were entered in Epic:   Additional Comments: Reece Levy, MSW [phone]

## 2012-09-08 NOTE — Progress Notes (Signed)
   Subjective:  No CP/SOB. POD # 9  Objective:  Temp:  [98.3 F (36.8 C)-99.4 F (37.4 C)] 99.4 F (37.4 C) (07/11 0449) Pulse Rate:  [73-115] 73 (07/11 0449) Resp:  [18] 18 (07/11 0449) BP: (96-127)/(42-69) 118/68 mmHg (07/11 0449) SpO2:  [94 %-99 %] 94 % (07/11 0449) Weight:  [161 lb 14.4 oz (73.437 kg)] 161 lb 14.4 oz (73.437 kg) (07/11 0449) Weight change:   Intake/Output from previous day: 07/10 0701 - 07/11 0700 In: 240 [P.O.:240] Out: -   Intake/Output from this shift:    Physical Exam: General appearance: alert and no distress Neck: no adenopathy, no carotid bruit, no JVD, supple, symmetrical, trachea midline and thyroid not enlarged, symmetric, no tenderness/mass/nodules Lungs: Decreased BS right base Heart: regular rate and rhythm, S1, S2 normal, no murmur, click, rub or gallop Extremities: extremities normal, atraumatic, no cyanosis or edema  Lab Results: Results for orders placed during the hospital encounter of 08/30/12 (from the past 48 hour(s))  PROTIME-INR     Status: None   Collection Time    09/07/12  6:38 AM      Result Value Range   Prothrombin Time 14.7  11.6 - 15.2 seconds   INR 1.17  0.00 - 1.49  PROTIME-INR     Status: Abnormal   Collection Time    09/08/12  3:55 AM      Result Value Range   Prothrombin Time 15.4 (*) 11.6 - 15.2 seconds   INR 1.25  0.00 - 1.49    Imaging: Imaging results have been reviewed  Assessment/Plan:   1. Principal Problem: 2.   S/P aortic valve replacement with bioprosthetic valve 3. Active Problems: 4.   HTN (hypertension) 5.   Normal coronary arteries-6/14 6.   COPD (chronic obstructive pulmonary disease)- noted on CXR 08/08/12 7.   Aortic valve stenosis, critical 8.   PAF (paroxysmal atrial fibrillation) 9.   Time Spent Directly with Patient:  20 minutes  Length of Stay:  LOS: 9 days   POD # 9 Bioprosthetic AVR. Looks great!! PAF currently in NSR on coumadin AC. INR 1.25. Exam benign. Labs OK.  Ambulating with [facility]. Plan transfer to rehab facility as bridge to going home today or tomorrow. I will see back in office 2-3 weeks. We will follow INR in our coumadin clinic.   Runell Gess 09/08/2012, 7:53 AM

## 2012-09-08 NOTE — Progress Notes (Signed)
CARDIAC REHAB PHASE I   PRE:  Rate/Rhythm: 78SR  BP:  Supine:   Sitting: 124/66  Standing:    SaO2: 95%RA  MODE:  Ambulation: 890 ft   POST:  Rate/Rhythm: 98SR  BP:  Supine:   Sitting: 135/72  Standing:    SaO2: 96%RA 1105-1155 Pt walked 890 ft on RA with rolling walker and minimal asst. Tolerated well. To chair after walk. Call bell in reach. Education completed. Understanding voiced by pt. Discussed CRP 2 and permission given to refer to [facility] Phase 2. Re enforced watching foods high in Vit K.   Luetta Nutting, RN BSN  09/08/2012 11:51 AM

## 2012-09-08 NOTE — Progress Notes (Addendum)
[address]       [place] [address]             [phone]      9 Days Post-Op  Procedure(s) (LRB): AORTIC VALVE REPLACEMENT (AVR) (N/A) INTRAOPERATIVE TRANSESOPHAGEAL ECHOCARDIOGRAM (N/A) Subjective: Feels ok  Objective  Telemetry sinus rhythm   Temp:  [98.3 F (36.8 C)-99.4 F (37.4 C)] 99.4 F (37.4 C) (07/11 0449) Pulse Rate:  [73-115] 73 (07/11 0449) Resp:  [18] 18 (07/11 0449) BP: (96-127)/(42-69) 118/68 mmHg (07/11 0449) SpO2:  [94 %-99 %] 94 % (07/11 0449) Weight:  [161 lb 14.4 oz (73.437 kg)] 161 lb 14.4 oz (73.437 kg) (07/11 0449)   Intake/Output Summary (Last 24 hours) at 09/08/12 0846 Last data filed at 09/08/12 0730  Gross per 24 hour  Intake    480 ml  Output      0 ml  Net    480 ml       General appearance: alert, cooperative and no distress Heart: regular rate and rhythm Lungs: mildly dim in bases Abdomen: benign Extremities: no edema Wound: incis heealing well  Lab Results:  Recent Labs  09/06/12 0445  NA 136  K 4.0  CL 101  CO2 27  GLUCOSE 114*  BUN 13  CREATININE 0.93  CALCIUM 8.6   No results found for this basename: AST, ALT, ALKPHOS, BILITOT, PROT, ALBUMIN,  in the last 72 hours No results found for this basename: LIPASE, AMYLASE,  in the last 72 hours  Recent Labs  09/06/12 0445  WBC 9.0  HGB 10.1*  HCT 29.8*  MCV 90.9  PLT 248   No results found for this basename: CKTOTAL, CKMB, TROPONINI,  in the last 72 hours No components found with this basename: POCBNP,  No results found for this basename: DDIMER,  in the last 72 hours No results found for this basename: HGBA1C,  in the last 72 hours No results found for this basename: CHOL, HDL, LDLCALC, TRIG, CHOLHDL,  in the last 72 hours No results found for this basename: TSH, T4TOTAL, FREET3, T3FREE, THYROIDAB,  in the last 72 hours No results found for this basename: VITAMINB12, FOLATE, FERRITIN, TIBC, IRON, RETICCTPCT,  in the last 72  hours  Medications: Scheduled . amiodarone  400 mg Oral BID  . aspirin EC  81 mg Oral Daily  . docusate sodium  100 mg Oral BID  . famotidine  20 mg Oral BID  . fesoterodine  8 mg Oral Daily  . magnesium hydroxide  15 mL Oral QHS  . metoprolol tartrate  25 mg Oral TID  . nystatin   Topical BID  . potassium chloride  20 mEq Oral BID  . warfarin  4 mg Oral q1800  . Warfarin - Physician Dosing Inpatient   Does not apply q1800     Radiology/Studies:  Dg Chest 2 View  09/08/2012   *RADIOLOGY REPORT*  Clinical Data: Shortness of breath, chest discomfort, pleural effusions, atelectasis, post AVR  CHEST - 2 VIEW  Comparison: 09/06/2012  Findings: Enlargement of cardiac silhouette post AVR. Atherosclerotic calcification aorta. Mediastinal contours and pulmonary vascularity normal. Bibasilar pleural effusions and atelectasis, slightly improved. Upper lungs clear. No pneumothorax. Bones diffusely demineralized with bilateral glenohumeral degenerative changes.  IMPRESSION: Enlargement of cardiac silhouette post AVR. Slightly decreased bibasilar pleural effusions and atelectasis.   Original Report Authenticated By: Ulyses Southward, M.D.    INR: 1.25  Will add last result for INR, ABG once components  are confirmed Will add last 4 CBG results once components are confirmed  Assessment/Plan: S/P Procedure(s) (LRB): AORTIC VALVE REPLACEMENT (AVR) (N/A) INTRAOPERATIVE TRANSESOPHAGEAL ECHOCARDIOGRAM (N/A)  1 conts to do well  2 conts to load coumadin 3 for SNF placement  LOS: 9 days    GOLD,WAYNE E 7/11/20148:46 AM  I have seen and examined the patient and agree with the assessment and plan as outlined.  Ready for d/c to SNF  [facility] 09/08/2012 12:55 PM

## 2012-09-08 NOTE — Progress Notes (Signed)
Patient for d/c today to SNF bed at [facility]. Family and patient agreeable to this plan- plan transfer via EMS. Reece Levy, MSW, [facility] [phone]

## 2012-09-11 ENCOUNTER — Non-Acute Institutional Stay (SKILLED_NURSING_FACILITY): Payer: Medicare Other | Admitting: Internal Medicine

## 2012-09-11 DIAGNOSIS — N318 Other neuromuscular dysfunction of bladder: Secondary | ICD-10-CM

## 2012-09-11 DIAGNOSIS — D62 Acute posthemorrhagic anemia: Secondary | ICD-10-CM | POA: Insufficient documentation

## 2012-09-11 DIAGNOSIS — I4891 Unspecified atrial fibrillation: Secondary | ICD-10-CM

## 2012-09-11 DIAGNOSIS — R531 Weakness: Secondary | ICD-10-CM | POA: Insufficient documentation

## 2012-09-11 DIAGNOSIS — I359 Nonrheumatic aortic valve disorder, unspecified: Secondary | ICD-10-CM | POA: Diagnosis not present

## 2012-09-11 DIAGNOSIS — R5381 Other malaise: Secondary | ICD-10-CM | POA: Diagnosis not present

## 2012-09-11 DIAGNOSIS — I350 Nonrheumatic aortic (valve) stenosis: Secondary | ICD-10-CM

## 2012-09-11 DIAGNOSIS — R5383 Other fatigue: Secondary | ICD-10-CM | POA: Diagnosis not present

## 2012-09-11 DIAGNOSIS — N3281 Overactive bladder: Secondary | ICD-10-CM | POA: Insufficient documentation

## 2012-09-11 DIAGNOSIS — I480 Paroxysmal atrial fibrillation: Secondary | ICD-10-CM

## 2012-09-11 NOTE — Progress Notes (Signed)
Patient ID: [name], female   DOB: [birth year], 79 y.o.   MRN: [id number]    PCP: Oneal Grout, MD  Code Status: full code  Allergies  Allergen Reactions  . Codeine Rash    All over the body.    Chief Complaint: new admit post hospitalization  HPI:  79 y/o female patient with hx of severe AS was admitted to the hospital and underwent aortic valve replacement. Post operatively, she developed afib and required amiodarone. She was also starte don anticoagulation. Her bp was running low in the hospital and initially required pressors. After this her bp slowly improved. She was started on cardiac rehab and was doing well. She has been sent to the facility for further rehab. She was seen in her room today with her husband present. She denies any complaints. See ros  Review of Systems  Constitutional: Negative for fever, chills and diaphoresis.  HENT: Negative for sore throat.   Eyes: Negative for blurred vision.  Respiratory: Positive for sputum production. Negative for cough and shortness of breath.   Cardiovascular: Negative for chest pain, palpitations, orthopnea and leg swelling.  Gastrointestinal: Negative for heartburn, nausea, vomiting, abdominal pain, diarrhea and constipation.  Genitourinary: Negative for dysuria and flank pain.  Musculoskeletal: Negative for myalgias and falls.  Skin: Negative for itching and rash.  Neurological: Negative for dizziness and headaches.  Psychiatric/Behavioral: Negative for depression.     Past Medical History  Diagnosis Date  . Arthritis   . Hyperlipidemia   . Heart murmur   . Hearing loss   . Change in voice   . Osteoporosis   . Cancer     Breast  . Aortic valve disorder 08/13/2011    ECHO - EF >55%; mild diastolic dysfunction; calcified aortic valve, not well visualized; mod/severe aortic stenosis w/ worsening gradients when compared to 2012  . PVD (peripheral vascular disease) 08/13/2010    R/P MV - normal pattern of perfusion  in all regions, EF 76%; no significant wall abnormalities noted; normal perfusion study  . Carotid bruit 08/06/2008    Doppler - R ECA demonstrates noarrowing w/ elevated velocities consistent w/ >70% diameter reduction; R and L ICAs show no evidence of diameter reduction, significant tortuosity or vascular abnormality;   . Aortic stenosis 07/20/2012    Aortic stenosis   . Hypertension     dr Allyson Sabal  . S/P aortic valve replacement with bioprosthetic valve 08/30/2012    21 mm [facility] Ease bovine pericardial tissue valve  . PAF (paroxysmal atrial fibrillation) 09/04/2012   Past Surgical History  Procedure Laterality Date  . Breast lumpectomy  02/25/1997    right  . Mastectomy  09/29/95    left  . Total hip arthroplasty  09/2010  . Colon surgery  1959  . Abdominal hysterectomy      Partial  . Eye surgery  1997    Cataract surgery  . Biopsy shoulder Left 10/08/2005    shave biopsy  . Cosmetic surgery Left 1997    Breast implant  . Left heart cath  08/14/12    Nl cors, AS  . Cardiac catheterization    . Aortic valve replacement N/A 08/30/2012    Procedure: AORTIC VALVE REPLACEMENT (AVR);  Surgeon: Purcell Nails, MD;  Location: [facility] OR;  Service: Open Heart Surgery;  Laterality: N/A;  . Intraoperative transesophageal echocardiogram N/A 08/30/2012    Procedure: INTRAOPERATIVE TRANSESOPHAGEAL ECHOCARDIOGRAM;  Surgeon: Purcell Nails, MD;  Location: [facility] OR;  Service: Open Heart  Surgery;  Laterality: N/A;   Social History:   reports that she has never smoked. She has never used smokeless tobacco. She reports that she does not drink alcohol or use illicit drugs.  Family History  Problem Relation Age of Onset  . Cancer Mother     Bladder  . Early death Father     Tractor accident  . Early death Brother     Tax inspector  . Early death Brother     during heart surgery    Medications: Patient's Medications  New Prescriptions   No medications on file  Previous Medications   ACETAMINOPHEN  (TYLENOL) 325 MG TABLET    Take 2 tablets (650 mg total) by mouth every 6 (six) hours as needed.   AMIODARONE (PACERONE) 400 MG TABLET    Take 1 tablet (400 mg total) by mouth 2 (two) times daily. For 7 days, then once daily   ASPIRIN EC 81 MG EC TABLET    Take 1 tablet (81 mg total) by mouth daily.   BIOTIN 5000 MCG CAPS    Take 1 capsule by mouth daily.   CALCIUM CITRATE-VITAMIN D (CITRACAL + D PO)    Take 1 tablet by mouth 2 (two) times daily.    DOCUSATE SODIUM 100 MG CAPS    Take 100 mg by mouth 2 (two) times daily as needed for constipation.   FESOTERODINE (TOVIAZ) 8 MG TB24    Take 8 mg by mouth daily.   FEXOFENADINE (ALLEGRA) 180 MG TABLET    Take 180 mg by mouth daily as needed (for allergies).   HYDROXYZINE (ATARAX/VISTARIL) 25 MG TABLET    Take 25 mg by mouth as needed for itching.   MAGNESIUM HYDROXIDE (PHILLIPS MILK OF MAGNESIA PO)    Take 15 mLs by mouth at bedtime.    METOPROLOL TARTRATE (LOPRESSOR) 25 MG TABLET    Take 1 tablet (25 mg total) by mouth 3 (three) times daily.   OMEPRAZOLE (PRILOSEC) 20 MG CAPSULE    TAKE 2 CAPSULES BY MOUTH DAILY FOR REFLUX   SHARK LIVER OIL-COCOA BUTTER (PREPARATION H) 0.25-3-85.5 % SUPPOSITORY    Place 1 suppository rectally as needed for hemorrhoids.   TRAMADOL (ULTRAM) 50 MG TABLET    Take 1 tablet (50 mg total) by mouth every 6 (six) hours as needed.   TRIAMCINOLONE (NASACORT) 55 MCG/ACT NASAL INHALER    Place 2 sprays into the nose daily as needed (for allergies).   VITAMIN C (ASCORBIC ACID) 500 MG TABLET    Take 500 mg by mouth daily.   VITAMIN E 400 UNIT CAPSULE    Take 400 Units by mouth daily.   WARFARIN (COUMADIN) 4 MG TABLET    Take 1 tablet (4 mg total) by mouth daily at 6 PM. As directed  Modified Medications   No medications on file  Discontinued Medications   No medications on file     Physical Exam: Filed Vitals:   09/11/12 1944  BP: 127/67  Pulse: 70  Temp: 96.1 F (35.6 C)  Resp: 16  SpO2: 95%   gen- frail, elderly  female patient in NAD HEENT- no pallor, no icterus, no LAD, MMM CVS- ns 1, s2, rrr respi- CTAB, no chest wall tenderness Skin- incision site healing well on the chest abdo- bs+, soft, non tender Ext- able to move all 4, no edema Neuro- no focal deficit  Labs reviewed: Basic Metabolic Panel:  Recent Labs  81/19/14 1930  08/31/12 0400  08/31/12 1655  09/02/12 0645 09/03/12 0425 09/06/12 0445  NA  --   < > 137  < >  --   < > 133* 133* 136  K  --   < > 3.8  < >  --   < > 3.3* 4.4 4.0  CL  --   < > 108  < >  --   < > 100 105 101  CO2  --   --  23  --   --   < > 22 25 27   GLUCOSE  --   < > 141*  < >  --   < > 132* 120* 114*  BUN  --   < > 10  < >  --   < > 21 17 13   CREATININE 0.81  < > 0.79  < > 0.95  < > 1.06 0.97 0.93  CALCIUM  --   --  7.7*  --   --   < > 7.6* 7.7* 8.6  MG 3.2*  --  2.7*  --  2.5  --   --   --   --   < > = values in this interval not displayed. Liver Function Tests:  Recent Labs  06/06/12 1503 07/11/12 0905 08/28/12 1525  AST 18 18 15   ALT 14 12 10   ALKPHOS 63 65 63  BILITOT 0.2 0.3 0.3  PROT 6.4 6.4 6.9  ALBUMIN  --  4.1 4.0   No results found for this basename: LIPASE, AMYLASE,  in the last 8760 hours No results found for this basename: AMMONIA,  in the last 8760 hours CBC:  Recent Labs  06/06/12 1503  08/31/12 1655 09/01/12 0415 09/06/12 0445  WBC 8.9  < > 16.3* 12.3* 9.0  NEUTROABS 6.1  --   --   --   --   HGB 11.9  < > 12.7 11.3* 10.1*  HCT 36.5  < > 37.3 32.2* 29.8*  MCV 96  < > 90.8 90.2 90.9  PLT  --   < > 113* 89* 248  < > = values in this interval not displayed.   Procedures: AORTIC VALVE REPLACEMENT (21 mm [facility] Ease pericardial tissue valve) - 08/30/2012   Assessment/Plan  Generalized weakness- s/p AVR, weak and frail at present. To work with PT/OT for gait training and muscle group exercises. Fall precautions. Encourage po intake. Has follow up with cardiology   Aortic stenosis- s/p AVR. On coumadin at present  given new onset afib. Goal inr 2-3. inr today 2.6 on 5 mg warfarin. Continue this and check inr 09/15/12  afib- continue amiodarone and lorpessor for now with aspirin and coumadin, check inr on 09/15/12.  Anemia- likely post op with blood loss. Check cbc  OAB- continue toviaz. Tolerating it well   Family/ staff Communication: reviewed care with patient, her husband and nursing supervisor   Labs/tests ordered- cbc, inr, cmp

## 2012-09-13 ENCOUNTER — Other Ambulatory Visit: Payer: Self-pay | Admitting: *Deleted

## 2012-09-13 DIAGNOSIS — I350 Nonrheumatic aortic (valve) stenosis: Secondary | ICD-10-CM

## 2012-09-18 ENCOUNTER — Ambulatory Visit (INDEPENDENT_AMBULATORY_CARE_PROVIDER_SITE_OTHER): Payer: Self-pay | Admitting: Physician Assistant

## 2012-09-18 ENCOUNTER — Ambulatory Visit
Admission: RE | Admit: 2012-09-18 | Discharge: 2012-09-18 | Disposition: A | Discharge status: Home / Self Care | Payer: Medicare Other | Source: Ambulatory Visit | Attending: Thoracic Surgery (Cardiothoracic Vascular Surgery) | Admitting: Thoracic Surgery (Cardiothoracic Vascular Surgery)

## 2012-09-18 VITALS — BP 150/70 | HR 72 | Resp 20 | Ht 63.0 in | Wt 161.0 lb

## 2012-09-18 DIAGNOSIS — I350 Nonrheumatic aortic (valve) stenosis: Secondary | ICD-10-CM

## 2012-09-18 DIAGNOSIS — R918 Other nonspecific abnormal finding of lung field: Secondary | ICD-10-CM | POA: Diagnosis not present

## 2012-09-18 DIAGNOSIS — Z954 Presence of other heart-valve replacement: Secondary | ICD-10-CM

## 2012-09-18 DIAGNOSIS — Z952 Presence of prosthetic heart valve: Secondary | ICD-10-CM

## 2012-09-18 NOTE — Progress Notes (Signed)
[address]       [name] [address]             [phone]          HPI: Patient returns for routine postoperative follow-up having undergone aortic valve replacement (21 mm [facility] Ease pericardial valve) by Dr. Cornelius Moras on 08/30/2012 .  The patient's postoperative course was notable for atrial fibrillation, which was treated with Amiodarone and Coumadin. At the time of discharge, she was maintaining sinus rhythm.  She otherwise did well and was transferred to skilled nursing facility on 7/1//2014 for further rehab.  Since hospital discharge, the patient has made slow but steady progress.  She is working with PT and OT, and reports feeling better with mobility, although she remains weak.  She denies chest pain, shortness of breath, or lower extremity edema.  Her appetite has been fair, but is improving.  She has not seen her cardiologist yet.    Current Outpatient Prescriptions  Medication Sig Dispense Refill  . acetaminophen (TYLENOL) 325 MG tablet Take 2 tablets (650 mg total) by mouth every 6 (six) hours as needed.      Marland Kitchen amiodarone (PACERONE) 400 MG tablet Take 400 mg by mouth daily.      Marland Kitchen aspirin EC 81 MG EC tablet Take 1 tablet (81 mg total) by mouth daily.      . Biotin 5000 MCG CAPS Take 1 capsule by mouth daily.      . Calcium Citrate-Vitamin D (CITRACAL + D PO) Take 1 tablet by mouth 2 (two) times daily.       Marland Kitchen docusate sodium 100 MG CAPS Take 100 mg by mouth 2 (two) times daily as needed for constipation.  10 capsule  0  . fesoterodine (TOVIAZ) 8 MG TB24 Take 8 mg by mouth daily.      . fexofenadine (ALLEGRA) 180 MG tablet Take 180 mg by mouth daily as needed (for allergies).      . hydrOXYzine (ATARAX/VISTARIL) 25 MG tablet Take 25 mg by mouth as needed for itching.      . Magnesium Hydroxide (PHILLIPS MILK OF MAGNESIA PO) Take 15 mLs by mouth at bedtime.       . metoprolol tartrate (LOPRESSOR) 25 MG tablet Take 1 tablet (25 mg total) by mouth 3  (three) times daily.      Marland Kitchen omeprazole (PRILOSEC) 20 MG capsule TAKE 2 CAPSULES BY MOUTH DAILY FOR REFLUX  60 capsule  5  . traMADol (ULTRAM) 50 MG tablet Take 1 tablet (50 mg total) by mouth every 6 (six) hours as needed.  50 tablet  0  . vitamin C (ASCORBIC ACID) 500 MG tablet Take 500 mg by mouth daily.      Marland Kitchen warfarin (COUMADIN) 4 MG tablet Take 1 tablet (4 mg total) by mouth daily at 6 PM. As directed       No current facility-administered medications for this visit.     Physical Exam: BP 150/70 HR 72 Resp 20 Wounds: Well healed, sternum stable to palpation. Heart: regular rate and rhythm Lungs: Clear to auscultation Extremities: No lower extremity edema   Diagnostic Tests: Chest xray: Dg Chest 2 View  09/18/2012   *RADIOLOGY REPORT*  Clinical Data: Status post internal valve replacement for aortic stenosis.  CHEST - 2 VIEW  Comparison: Multiple priors  Findings: Cardiac and mediastinal contours are similar.  There is mild pulmonary vascular congestion.  Bilateral pleural effusions and adjacent airspace opacities  are unchanged.  No acute osseous abnormalities.  IMPRESSION: 1. No significant change in bibasilar opacities, likely representing a combination of atelectasis and pleural fluid.  2. Mild pulmonary vascular congestion.   Original Report Authenticated By: Jerene Dilling, M.D.       Assessment/Plan: The patient is making slow and steady progress status post AVR.  She remains at [facility] for rehab.  She is encouraged to continue with PT/OT as directed.  She is maintaining sinus rhythm, and Dr. Glade Lloyd at the facility is managing her Coumadin.  She does need to make an appointment with Dr. Hazle Coca office in the next week or so for medication/rhythm management.  We will see her back in 3-4 weeks for recheck.

## 2012-09-25 ENCOUNTER — Ambulatory Visit: Payer: Medicare Other

## 2012-09-26 ENCOUNTER — Encounter: Payer: Self-pay | Admitting: Adult Health

## 2012-09-26 ENCOUNTER — Non-Acute Institutional Stay (SKILLED_NURSING_FACILITY): Payer: Medicare Other | Admitting: Adult Health

## 2012-09-26 DIAGNOSIS — I4891 Unspecified atrial fibrillation: Secondary | ICD-10-CM | POA: Diagnosis not present

## 2012-09-26 DIAGNOSIS — R5381 Other malaise: Secondary | ICD-10-CM

## 2012-09-26 DIAGNOSIS — I359 Nonrheumatic aortic valve disorder, unspecified: Secondary | ICD-10-CM

## 2012-09-26 DIAGNOSIS — I350 Nonrheumatic aortic (valve) stenosis: Secondary | ICD-10-CM

## 2012-09-26 DIAGNOSIS — R5383 Other fatigue: Secondary | ICD-10-CM | POA: Diagnosis not present

## 2012-09-26 DIAGNOSIS — R531 Weakness: Secondary | ICD-10-CM

## 2012-09-26 NOTE — Progress Notes (Signed)
Patient ID: [name], female   DOB: 03-09-1931, 79 y.o.   MRN: [id number]  [facility]  Allergies  Allergen Reactions  . Codeine Rash    All over the body.     Chief Complaint  Patient presents with  . Discharge Note    HPI: She had been hospitalized for an aortic valve replacement. She had been admitted to this facility for short term rehab prior to being discharged to home. She has done well with therapy and is ready for discharge to home. She will need home health for pt/ot; will not need dme; will need prescriptions to be written.   Past Medical History  Diagnosis Date  . Arthritis   . Hyperlipidemia   . Heart murmur   . Hearing loss   . Change in voice   . Osteoporosis   . Cancer     Breast  . Aortic valve disorder 08/13/2011    ECHO - EF >55%; mild diastolic dysfunction; calcified aortic valve, not well visualized; mod/severe aortic stenosis w/ worsening gradients when compared to 2012  . PVD (peripheral vascular disease) 08/13/2010    R/P MV - normal pattern of perfusion in all regions, EF 76%; no significant wall abnormalities noted; normal perfusion study  . Carotid bruit 08/06/2008    Doppler - R ECA demonstrates noarrowing w/ elevated velocities consistent w/ >70% diameter reduction; R and L ICAs show no evidence of diameter reduction, significant tortuosity or vascular abnormality;   . Aortic stenosis 07/20/2012    Aortic stenosis   . Hypertension     dr Allyson Sabal  . S/P aortic valve replacement with bioprosthetic valve 08/30/2012    21 mm [facility] Ease bovine pericardial tissue valve  . PAF (paroxysmal atrial fibrillation) 09/04/2012    Past Surgical History  Procedure Laterality Date  . Breast lumpectomy  02/25/1997    right  . Mastectomy  09/29/95    left  . Total hip arthroplasty  09/2010  . Colon surgery  1959  . Abdominal hysterectomy      Partial  . Eye surgery  1997    Cataract surgery  . Biopsy shoulder Left 10/08/2005    shave biopsy  .  Cosmetic surgery Left 1997    Breast implant  . Left heart cath  08/14/12    Nl cors, AS  . Cardiac catheterization    . Aortic valve replacement N/A 08/30/2012    Procedure: AORTIC VALVE REPLACEMENT (AVR);  Surgeon: Purcell Nails, MD;  Location: [facility] OR;  Service: Open Heart Surgery;  Laterality: N/A;  . Intraoperative transesophageal echocardiogram N/A 08/30/2012    Procedure: INTRAOPERATIVE TRANSESOPHAGEAL ECHOCARDIOGRAM;  Surgeon: Purcell Nails, MD;  Location: [facility] OR;  Service: Open Heart Surgery;  Laterality: N/A;    VITAL SIGNS BP 132/70  Pulse 78  Ht 5\' 3"  (1.6 m)  Wt 169 lb (76.658 kg)  BMI 29.94 kg/m2   Patient's Medications  New Prescriptions   No medications on file  Previous Medications   ACETAMINOPHEN (TYLENOL) 325 MG TABLET    Take 2 tablets (650 mg total) by mouth every 6 (six) hours as needed.   AMIODARONE (PACERONE) 400 MG TABLET    Take 400 mg by mouth daily.   ASPIRIN EC 81 MG EC TABLET    Take 1 tablet (81 mg total) by mouth daily.   BIOTIN 5000 MCG CAPS    Take 1 capsule by mouth daily.   CALCIUM CITRATE-VITAMIN D (CITRACAL + D PO)  Take 1 tablet by mouth 2 (two) times daily.    DOCUSATE SODIUM 100 MG CAPS    Take 100 mg by mouth 2 (two) times daily as needed for constipation.   FESOTERODINE (TOVIAZ) 8 MG TB24    Take 8 mg by mouth daily.   FEXOFENADINE (ALLEGRA) 180 MG TABLET    Take 180 mg by mouth daily as needed (for allergies).   HYDROXYZINE (ATARAX/VISTARIL) 25 MG TABLET    Take 25 mg by mouth every 6 (six) hours as needed for itching.    MAGNESIUM HYDROXIDE (PHILLIPS MILK OF MAGNESIA PO)    Take 15 mLs by mouth at bedtime.    METOPROLOL TARTRATE (LOPRESSOR) 25 MG TABLET    Take 1 tablet (25 mg total) by mouth 3 (three) times daily.   MOMETASONE (NASONEX) 50 MCG/ACT NASAL SPRAY    Place 2 sprays into the nose daily as needed.   OMEPRAZOLE (PRILOSEC) 20 MG CAPSULE    TAKE 2 CAPSULES BY MOUTH DAILY FOR REFLUX   TRAMADOL (ULTRAM) 50 MG TABLET    Take 1 tablet  (50 mg total) by mouth every 6 (six) hours as needed.   VITAMIN C (ASCORBIC ACID) 500 MG TABLET    Take 500 mg by mouth daily.  Modified Medications   Modified Medication Previous Medication   WARFARIN (COUMADIN) 4 MG TABLET warfarin (COUMADIN) 4 MG tablet      Take 5 mg by mouth daily at 6 PM. As directed    Take 1 tablet (4 mg total) by mouth daily at 6 PM. As directed  Discontinued Medications   No medications on file    SIGNIFICANT DIAGNOSTIC EXAMS     Component Value Date/Time   ALBUMIN 4.0 08/28/2012 1525   AST 15 08/28/2012 1525   ALT 10 08/28/2012 1525   ALKPHOS 63 08/28/2012 1525   BILITOT 0.3 08/28/2012 1525       Component Value Date/Time   BUN 13 09/06/2012 0445   BUN 11 06/06/2012 1503   GLUCOSE 114* 09/06/2012 0445   GLUCOSE 94 06/06/2012 1503   CREATININE 0.93 09/06/2012 0445   CREATININE 0.90 08/08/2012 0805   K 4.0 09/06/2012 0445   NA 136 09/06/2012 0445   NA 140 06/06/2012 1503   TSH 1.366 08/08/2012 0805       Component Value Date/Time   WBC 9.0 09/06/2012 0445   WBC 8.9 06/06/2012 1503   RBC 3.28* 09/06/2012 0445   RBC 3.82 06/06/2012 1503   HGB 10.1* 09/06/2012 0445   HCT 29.8* 09/06/2012 0445   PLT 248 09/06/2012 0445   MCV 90.9 09/06/2012 0445    Review of Systems  Constitutional: Negative for malaise/fatigue.  Respiratory: Negative for cough and shortness of breath.   Cardiovascular: Negative for chest pain, palpitations and leg swelling.  Gastrointestinal: Negative for heartburn, abdominal pain and constipation.  Musculoskeletal: Negative for myalgias and joint pain.  Skin: Negative.   Neurological: Negative for dizziness and headaches.  Psychiatric/Behavioral: Negative for depression. The patient does not have insomnia.       Physical Exam  Constitutional: She appears well-developed and well-nourished.  Neck: Neck supple. No JVD present.  Cardiovascular: Normal rate, regular rhythm and intact distal pulses.   Respiratory: Effort normal and breath sounds normal. No  respiratory distress. She has no wheezes.  GI: Soft. Bowel sounds are normal. She exhibits no distension. There is no tenderness.  Musculoskeletal: Normal range of motion. She exhibits no edema.  Neurological: She is alert.  Skin:  Skin is warm and dry.       ASSESSMENT/ PLAN:  Will discharge her to home with home health for pt/ot; will not need dme. Prescriptions written.   Time spent with patient 35 minutes.

## 2012-09-27 ENCOUNTER — Non-Acute Institutional Stay (SKILLED_NURSING_FACILITY): Payer: Medicare Other | Admitting: Adult Health

## 2012-09-27 DIAGNOSIS — I4891 Unspecified atrial fibrillation: Secondary | ICD-10-CM | POA: Diagnosis not present

## 2012-09-27 DIAGNOSIS — Z7901 Long term (current) use of anticoagulants: Secondary | ICD-10-CM

## 2012-09-27 DIAGNOSIS — I480 Paroxysmal atrial fibrillation: Secondary | ICD-10-CM

## 2012-10-02 ENCOUNTER — Ambulatory Visit (INDEPENDENT_AMBULATORY_CARE_PROVIDER_SITE_OTHER): Payer: Medicare Other | Admitting: Cardiovascular Disease

## 2012-10-02 ENCOUNTER — Ambulatory Visit (INDEPENDENT_AMBULATORY_CARE_PROVIDER_SITE_OTHER): Payer: Self-pay | Admitting: Pharmacist

## 2012-10-02 ENCOUNTER — Encounter: Payer: Self-pay | Admitting: Cardiovascular Disease

## 2012-10-02 VITALS — BP 116/60 | HR 66 | Ht 63.0 in | Wt 162.0 lb

## 2012-10-02 DIAGNOSIS — I10 Essential (primary) hypertension: Secondary | ICD-10-CM

## 2012-10-02 DIAGNOSIS — I350 Nonrheumatic aortic (valve) stenosis: Secondary | ICD-10-CM

## 2012-10-02 DIAGNOSIS — I480 Paroxysmal atrial fibrillation: Secondary | ICD-10-CM

## 2012-10-02 DIAGNOSIS — I4891 Unspecified atrial fibrillation: Secondary | ICD-10-CM

## 2012-10-02 DIAGNOSIS — Z7901 Long term (current) use of anticoagulants: Secondary | ICD-10-CM | POA: Insufficient documentation

## 2012-10-02 DIAGNOSIS — I359 Nonrheumatic aortic valve disorder, unspecified: Secondary | ICD-10-CM | POA: Diagnosis not present

## 2012-10-02 LAB — POCT INR: INR: 3.6

## 2012-10-02 MED ORDER — METOPROLOL TARTRATE 25 MG PO TABS: 25.0000 mg | ORAL_TABLET | Freq: Two times a day (BID) | ORAL | Status: DC

## 2012-10-02 NOTE — Assessment & Plan Note (Signed)
Postoperatively currently in sinus rhythm on low dose amiodarone and Coumadin anticoagulation. We'll see her back in 3 months at which time she remains in sinus rhythm we'll discontinue both of these medications.

## 2012-10-02 NOTE — Patient Instructions (Addendum)
  Your physician wants you to follow-up with him in : 6 months                                            and with an extender in : 3 months                    You will receive a reminder letter in the mail one month in advance. If you don't receive a letter, please call our office to schedule the follow-up appointment.   Your physician recommends that you return for lab work in: Consulting civil engineer) will tell you when to have your coumadin checked again   Your physician has recommended you make the following change in your medication: stop Aspirin, Change metoprolol to 25mg  twice a day   Your physician has ordered the following tests: echocardiogram  Dr Allyson Sabal has cleared you to start cardiac rehab.  They should contact you.

## 2012-10-02 NOTE — Assessment & Plan Note (Signed)
   She underwent cardiac catheterization revealing normal coronary arteries, normal LV function and critical AS. She underwent pericardial tissue aortic valve replacement by Dr. Cornelius Moras on 08/30/12 with an [facility] Ease   Tissue valve size 21 mm. Her postop course was competent by nausea and episodes of paroxysmal A. Fib. Ultimately sutured with amiodarone and Coumadin anticoagulation. She was discharged home on 7/11 and spent the next 2 weeks and rehabilitation facility. She spell at home with her husband.

## 2012-10-02 NOTE — Progress Notes (Signed)
10/02/2012 KLEA NALL   [birth year]  [id number]  Primary Physician Oneal Grout, MD Primary Cardiologist: Runell Gess MD [name]   HPI:  The patient is a 79 year old mildly overweight married Caucasian female accompanied by her husband, who is also a patient of mine. She is a mother of 2, grandmother to 1 grandchild. I saw her a year ago after they had celebrated their 60th wedding anniversary. Her risk factors include hypertension and family history. A brother died at age 2 from heart-related issues while he was being operated on. She has never had a heart attack or stroke. She does have moderate aortic stenosis by 2D echocardiogram last performed a year ago with a valve area of 0.83 cm2, peak gradient of 57, and mean of 35. She had negative Myoview on August 13, 2010. Since I saw her last, she developed exertional jaw pain, which was fairly reproducible. Her last lipid profile a year ago was excellent with total cholesterol 166, LDL 89, HDL 44. 2-D echo was performed showed critical aortic stenosis the valve area 0.5 cm. Based on thisthe patient underwent right left heart cardiac catheterization by myself revealing normal coronaries and normal left function. She had critical aortic stenosis and ultimately underwent bioprosthetic aortic valve replacement by Dr. Cornelius Moras on 08/30/12 with an Edwards magna ease pericardial tissue valve (21 mm) excellent result. Her postop course was accompanied by nausea and paroxysmal atrial fibrillation for which she was shooting with low dose amiodarone and Coumadin anticoagulation.  Current Outpatient Prescriptions  Medication Sig Dispense Refill  . acetaminophen (TYLENOL) 325 MG tablet Take 2 tablets (650 mg total) by mouth every 6 (six) hours as needed.      Marland Kitchen amiodarone (PACERONE) 200 MG tablet Take 200 mg by mouth. Two tabs at one time      . aspirin EC 81 MG EC tablet Take 1 tablet (81 mg total) by mouth daily.      . Biotin 5000 MCG CAPS  Take 1 capsule by mouth daily.      . Calcium Citrate-Vitamin D (CITRACAL + D PO) Take 1 tablet by mouth 2 (two) times daily.       . fesoterodine (TOVIAZ) 8 MG TB24 Take 8 mg by mouth daily.      . fexofenadine (ALLEGRA) 180 MG tablet Take 180 mg by mouth daily as needed (for allergies).      . hydrOXYzine (ATARAX/VISTARIL) 25 MG tablet Take 25 mg by mouth every 6 (six) hours as needed for itching.       . magnesium hydroxide (MILK OF MAGNESIA) 400 MG/5ML suspension Take by mouth daily. Usually around six o clock      . Magnesium Hydroxide (PHILLIPS MILK OF MAGNESIA PO) Take 15 mLs by mouth at bedtime.       . metoprolol tartrate (LOPRESSOR) 25 MG tablet Take 1 tablet (25 mg total) by mouth 3 (three) times daily.      . mometasone (NASONEX) 50 MCG/ACT nasal spray Place 2 sprays into the nose daily as needed.      . mupirocin ointment (BACTROBAN) 2 %       . traMADol (ULTRAM) 50 MG tablet Take 1 tablet (50 mg total) by mouth every 6 (six) hours as needed.  50 tablet  0  . vitamin C (ASCORBIC ACID) 500 MG tablet Take 500 mg by mouth daily.      . vitamin E 400 UNIT capsule Take 400 Units by mouth daily.      Marland Kitchen  warfarin (COUMADIN) 4 MG tablet Take 5 mg by mouth daily at 6 PM. As directed      . omeprazole (PRILOSEC) 20 MG capsule TAKE 2 CAPSULES BY MOUTH DAILY FOR REFLUX  60 capsule  5   No current facility-administered medications for this visit.    Allergies  Allergen Reactions  . Codeine Rash    All over the body.    History   Social History  . Marital Status: Married    Spouse Name: N/A    Number of Children: N/A  . Years of Education: N/A   Occupational History  . Not on file.   Social History Main Topics  . Smoking status: Never Smoker   . Smokeless tobacco: Never Used  . Alcohol Use: No  . Drug Use: No  . Sexually Active: Not on file   Other Topics Concern  . Not on file   Social History Narrative  . No narrative on file     Review of Systems: General: negative  for chills, fever, night sweats or weight changes.  Cardiovascular: negative for chest pain, dyspnea on exertion, edema, orthopnea, palpitations, paroxysmal nocturnal dyspnea or shortness of breath Dermatological: negative for rash Respiratory: negative for cough or wheezing Urologic: negative for hematuria Abdominal: negative for nausea, vomiting, diarrhea, bright red blood per rectum, melena, or hematemesis Neurologic: negative for visual changes, syncope, or dizziness All other systems reviewed and are otherwise negative except as noted above.    Blood pressure 116/60, pulse 66, height 5\' 3"  (1.6 m), weight 162 lb (73.483 kg).  General appearance: alert and no distress Neck: no adenopathy, no JVD, supple, symmetrical, trachea midline, thyroid not enlarged, symmetric, no tenderness/mass/nodules and bilateral high-pitched carotid bruits Lungs: clear to auscultation bilaterally Heart: regular rate and rhythm, S1, S2 normal, no murmur, click, rub or gallop Extremities: extremities normal, atraumatic, no cyanosis or edema  EKG normal sinus rhythm at 66 without ST or T wave changes  ASSESSMENT AND PLAN:   Aortic valve stenosis, critical   She underwent cardiac catheterization revealing normal coronary arteries, normal LV function and critical AS. She underwent pericardial tissue aortic valve replacement by Dr. Cornelius Moras on 08/30/12 with an [facility] Ease   Tissue valve size 21 mm. Her postop course was competent by nausea and episodes of paroxysmal A. Fib. Ultimately sutured with amiodarone and Coumadin anticoagulation. She was discharged home on 7/11 and spent the next 2 weeks and rehabilitation facility. She spell at home with her husband.  PAF (paroxysmal atrial fibrillation) Postoperatively currently in sinus rhythm on low dose amiodarone and Coumadin anticoagulation. We'll see her back in 3 months at which time she remains in sinus rhythm we'll discontinue both of these  medications.      Runell Gess MD FACP,FACC,FAHA, [facility] 10/02/2012 12:53 PM

## 2012-10-03 ENCOUNTER — Telehealth: Payer: Self-pay | Admitting: Cardiovascular Disease

## 2012-10-03 NOTE — Telephone Encounter (Signed)
[facility] is trying to find out about cardiac rehab for patient.  She is unable to "admit" her until home health started (?). Also needs to know when pt needs coumadin checked again. Beecher Mcardle is at the patient's house.

## 2012-10-03 NOTE — Telephone Encounter (Signed)
Pt daughter called regarding her mother's coumadin. Dr. Allyson Sabal was supposed to change her coumadin mg, they checked with the pharmacy and nothing has been called in as of yet. She did not take her coumadin yesterday.

## 2012-10-03 NOTE — Telephone Encounter (Signed)
[name]'s dgt has called again and would like for you to call her.  [phone] or [phone].

## 2012-10-03 NOTE — Telephone Encounter (Signed)
Spoke with pt's daughter.  Explained to her that I'm not sure what Dr. Allyson Sabal was going to send in, but we are still awaiting records from [facility] before we will be able to accurately dose Coumadin.  Will send in new Rx later today once records received.

## 2012-10-03 NOTE — Telephone Encounter (Signed)
Spoke with pt's daughter.  She relayed more information regarding mom's medications.  Documented in anti-coag visit.

## 2012-10-03 NOTE — Telephone Encounter (Signed)
I spoke with [name].  I sent in the order yesterday for cardiac rehab.  She will call cardiac rehab to see when the start date will be.

## 2012-10-03 NOTE — Telephone Encounter (Signed)
Need to know asap-like the next 10 or 15 minutes-When will she start Rehab?

## 2012-10-03 NOTE — Telephone Encounter (Signed)
Message forwarded to S. Putt, RPH/K. Petra Kuba, RN for review and Rx.

## 2012-10-04 ENCOUNTER — Telehealth: Payer: Self-pay | Admitting: Pharmacist

## 2012-10-04 NOTE — Telephone Encounter (Signed)
Please call-concerning her dose of Coumadin.

## 2012-10-04 NOTE — Telephone Encounter (Signed)
Still unable to get records from [facility].  Left another message earlier today.  Will go ahead and dose Coumadin.  Pt's daughter aware if we do receive records and changes need to be made, we will call her.  Pt set up to see Belenda Cruise for redraw next week.

## 2012-10-04 NOTE — Telephone Encounter (Signed)
Please see previous phone notes.  

## 2012-10-04 NOTE — Telephone Encounter (Signed)
Please call her on her cell phone-she had talked to yesterday-says when you call back-please do on her cell phone.

## 2012-10-05 ENCOUNTER — Telehealth: Payer: Self-pay | Admitting: Pharmacist

## 2012-10-05 NOTE — Telephone Encounter (Signed)
Wants to know if you got the medical records from [facility]?

## 2012-10-06 NOTE — Telephone Encounter (Signed)
Spoke to pt's daughter.  We have not received records at this time.    Daughter also mentioned that her mother had an episode of CP since she has been home.  It occurred while she was trying to pick up detergent from the floor to the counter.  Spoke with pt.  Pain was not associated with SOB, diaphoresis, or nausea.  It resolved on its own.  States she had similar pains while she was in [employer].  She had a cath in June that showed normal coronaries.  Had AVR in July.  Likely muscular pain related to surgery.  Instructed daughter to call 911 if pain continues or is associated with SOB, diaphoresis or N/V.

## 2012-10-06 NOTE — Telephone Encounter (Signed)
Pt has nl cors. CP prob non cardiac

## 2012-10-07 ENCOUNTER — Encounter: Payer: Self-pay | Admitting: Adult Health

## 2012-10-07 NOTE — Assessment & Plan Note (Signed)
Will hold her coumadin therapy today; will continue to monitor her status; her heart is stable at this time.

## 2012-10-07 NOTE — Assessment & Plan Note (Signed)
For her inr of >8.0; will plkace her on bed rest in order to reduce risk of bleeding will hold her coumadin today and iwll give her vitamin k 2.5 mg will check inr in the am and will monitor

## 2012-10-07 NOTE — Progress Notes (Signed)
Patient ID: [name], female   DOB: [birth year], 79 y.o.   MRN: [id number]  [facility]  Allergies  Allergen Reactions  . Codeine Rash    All over the body.     Chief Complaint  Patient presents with  . Acute Visit    couomadin management     HPI: She was due to be discharged today; however; her inr is elevated at >8; the discharge will have to be placed on hold until her inr is therapeutic. She is not happy about the change in her discharge date; however; she does understand and is in agreement with the plan of action.   Past Medical History  Diagnosis Date  . Arthritis   . Hyperlipidemia   . Heart murmur   . Hearing loss   . Change in voice   . Osteoporosis   . Cancer     Breast  . Aortic valve disorder 08/13/2011    ECHO - EF >55%; mild diastolic dysfunction; calcified aortic valve, not well visualized; mod/severe aortic stenosis w/ worsening gradients when compared to 2012  . PVD (peripheral vascular disease) 08/13/2010    R/P MV - normal pattern of perfusion in all regions, EF 76%; no significant wall abnormalities noted; normal perfusion study  . Carotid bruit 08/06/2008    Doppler - R ECA demonstrates noarrowing w/ elevated velocities consistent w/ >70% diameter reduction; R and L ICAs show no evidence of diameter reduction, significant tortuosity or vascular abnormality;   . Aortic stenosis 07/20/2012    Aortic stenosis   . Hypertension     dr Allyson Sabal  . S/P aortic valve replacement with bioprosthetic valve 08/30/2012    21 mm [facility] Ease bovine pericardial tissue valve  . PAF (paroxysmal atrial fibrillation) 09/04/2012    Past Surgical History  Procedure Laterality Date  . Breast lumpectomy  02/25/1997    right  . Mastectomy  09/29/95    left  . Total hip arthroplasty  09/2010  . Colon surgery  1959  . Abdominal hysterectomy      Partial  . Eye surgery  1997    Cataract surgery  . Biopsy shoulder Left 10/08/2005    shave biopsy  . Cosmetic surgery  Left 1997    Breast implant  . Left heart cath  08/14/12    Nl cors, AS  . Cardiac catheterization    . Aortic valve replacement N/A 08/30/2012    Procedure: AORTIC VALVE REPLACEMENT (AVR);  Surgeon: Purcell Nails, MD;  Location: [facility] OR;  Service: Open Heart Surgery;  Laterality: N/A;  . Intraoperative transesophageal echocardiogram N/A 08/30/2012    Procedure: INTRAOPERATIVE TRANSESOPHAGEAL ECHOCARDIOGRAM;  Surgeon: Purcell Nails, MD;  Location: [facility] OR;  Service: Open Heart Surgery;  Laterality: N/A;    VITAL SIGNS BP 132/79  Pulse 82  Ht 5\' 3"  (1.6 m)  Wt 161 lb (73.029 kg)  BMI 28.53 kg/m2   Patient's Medications  New Prescriptions   No medications on file  Previous Medications   ACETAMINOPHEN (TYLENOL) 325 MG TABLET    Take 2 tablets (650 mg total) by mouth every 6 (six) hours as needed.   AMIODARONE (PACERONE) 200 MG TABLET    Take 200 mg by mouth. Two tabs at one time   BIOTIN 5000 MCG CAPS    Take 1 capsule by mouth daily.   CALCIUM CITRATE-VITAMIN D (CITRACAL + D PO)    Take 1 tablet by mouth 2 (two) times daily.    FESOTERODINE (  TOVIAZ) 8 MG TB24    Take 8 mg by mouth daily.   FEXOFENADINE (ALLEGRA) 180 MG TABLET    Take 180 mg by mouth daily as needed (for allergies).   HYDROXYZINE (ATARAX/VISTARIL) 25 MG TABLET    Take 25 mg by mouth every 6 (six) hours as needed for itching.    MAGNESIUM HYDROXIDE (MILK OF MAGNESIA) 400 MG/5ML SUSPENSION    Take by mouth daily. Usually around six o clock   MAGNESIUM HYDROXIDE (PHILLIPS MILK OF MAGNESIA PO)    Take 15 mLs by mouth at bedtime.    METOPROLOL TARTRATE (LOPRESSOR) 25 MG TABLET    Take 1 tablet (25 mg total) by mouth 2 (two) times daily.   MOMETASONE (NASONEX) 50 MCG/ACT NASAL SPRAY    Place 2 sprays into the nose daily as needed.   MUPIROCIN OINTMENT (BACTROBAN) 2 %       OMEPRAZOLE (PRILOSEC) 20 MG CAPSULE    TAKE 2 CAPSULES BY MOUTH DAILY FOR REFLUX   TRAMADOL (ULTRAM) 50 MG TABLET    Take 1 tablet (50 mg total) by mouth  every 6 (six) hours as needed.   VITAMIN C (ASCORBIC ACID) 500 MG TABLET    Take 500 mg by mouth daily.   VITAMIN E 400 UNIT CAPSULE    Take 400 Units by mouth daily.   WARFARIN (COUMADIN) 4 MG TABLET    Take 5 mg by mouth daily at 6 PM. As directed  Modified Medications   No medications on file  Discontinued Medications   No medications on file    SIGNIFICANT DIAGNOSTIC EXAMS     Component Value Date/Time   ALBUMIN 4.0 08/28/2012 1525   AST 15 08/28/2012 1525   ALT 10 08/28/2012 1525   ALKPHOS 63 08/28/2012 1525   BILITOT 0.3 08/28/2012 1525       Component Value Date/Time   BUN 13 09/06/2012 0445   BUN 11 06/06/2012 1503   GLUCOSE 114* 09/06/2012 0445   GLUCOSE 94 06/06/2012 1503   CREATININE 0.93 09/06/2012 0445   CREATININE 0.90 08/08/2012 0805   K 4.0 09/06/2012 0445   NA 136 09/06/2012 0445   NA 140 06/06/2012 1503   TSH 1.366 08/08/2012 0805       Component Value Date/Time   WBC 9.0 09/06/2012 0445   WBC 8.9 06/06/2012 1503   RBC 3.28* 09/06/2012 0445   RBC 3.82 06/06/2012 1503   HGB 10.1* 09/06/2012 0445   HCT 29.8* 09/06/2012 0445   PLT 248 09/06/2012 0445   MCV 90.9 09/06/2012 0445    Review of Systems  Constitutional: Negative for malaise/fatigue.  Respiratory: Negative for cough and shortness of breath.   Cardiovascular: Negative for chest pain, palpitations and leg swelling.  Gastrointestinal: Negative for heartburn, abdominal pain and constipation.  Musculoskeletal: Negative for myalgias and joint pain.  Skin: Negative.   Neurological: Negative for dizziness and headaches.  Psychiatric/Behavioral: Negative for depression. The patient does not have insomnia.       Physical Exam  Constitutional: She appears well-developed and well-nourished.  Neck: Neck supple. No JVD present.  Cardiovascular: Normal rate, regular rhythm and intact distal pulses.   Respiratory: Effort normal and breath sounds normal. No respiratory distress. She has no wheezes.  GI: Soft. Bowel sounds are  normal. She exhibits no distension. There is no tenderness.  Musculoskeletal: Normal range of motion. She exhibits no edema.  Neurological: She is alert.  Skin: Skin is warm and dry.  ASSESSMENT/ PLAN:  Long term (current) use of anticoagulants For her inr of >8.0; will plkace her on bed rest in order to reduce risk of bleeding will hold her coumadin today and iwll give her vitamin k 2.5 mg will check inr in the am and will monitor  PAF (paroxysmal atrial fibrillation) Will hold her coumadin therapy today; will continue to monitor her status; her heart is stable at this time.

## 2012-10-09 ENCOUNTER — Telehealth: Payer: Self-pay | Admitting: Pharmacist Clinician (PhC)/ Clinical Pharmacy Specialist

## 2012-10-09 NOTE — Telephone Encounter (Signed)
Pt's daughter is calling [name] stating that she never received records that she requested from [facility]. Can you please call Rosalita Chessman back at [facility].

## 2012-10-09 NOTE — Telephone Encounter (Signed)
Kimi called stating that [facility] needs the paperwork and that someone needs to get in touch with someone ASAP

## 2012-10-11 ENCOUNTER — Ambulatory Visit (INDEPENDENT_AMBULATORY_CARE_PROVIDER_SITE_OTHER): Payer: Medicare Other | Admitting: Pharmacist Clinician (PhC)/ Clinical Pharmacy Specialist

## 2012-10-11 DIAGNOSIS — I480 Paroxysmal atrial fibrillation: Secondary | ICD-10-CM

## 2012-10-11 DIAGNOSIS — Z7901 Long term (current) use of anticoagulants: Secondary | ICD-10-CM | POA: Diagnosis not present

## 2012-10-11 DIAGNOSIS — I4891 Unspecified atrial fibrillation: Secondary | ICD-10-CM

## 2012-10-11 LAB — POCT INR: INR: 5.7

## 2012-10-12 ENCOUNTER — Other Ambulatory Visit (HOSPITAL_BASED_OUTPATIENT_CLINIC_OR_DEPARTMENT_OTHER): Payer: Self-pay | Admitting: Internal Medicine

## 2012-10-12 ENCOUNTER — Other Ambulatory Visit: Payer: Self-pay | Admitting: Cardiovascular Disease

## 2012-10-12 ENCOUNTER — Other Ambulatory Visit: Payer: Self-pay | Admitting: Pharmacist Clinician (PhC)/ Clinical Pharmacy Specialist

## 2012-10-12 ENCOUNTER — Encounter (HOSPITAL_COMMUNITY)
Admission: RE | Admit: 2012-10-12 | Discharge: 2012-10-12 | Disposition: A | Discharge status: Home / Self Care | Payer: Medicare Other | Source: Ambulatory Visit | Attending: Cardiovascular Disease | Admitting: Cardiovascular Disease

## 2012-10-12 DIAGNOSIS — I739 Peripheral vascular disease, unspecified: Secondary | ICD-10-CM | POA: Insufficient documentation

## 2012-10-12 DIAGNOSIS — J449 Chronic obstructive pulmonary disease, unspecified: Secondary | ICD-10-CM | POA: Insufficient documentation

## 2012-10-12 DIAGNOSIS — Z79899 Other long term (current) drug therapy: Secondary | ICD-10-CM | POA: Insufficient documentation

## 2012-10-12 DIAGNOSIS — J4489 Other specified chronic obstructive pulmonary disease: Secondary | ICD-10-CM | POA: Insufficient documentation

## 2012-10-12 DIAGNOSIS — Z954 Presence of other heart-valve replacement: Secondary | ICD-10-CM | POA: Insufficient documentation

## 2012-10-12 DIAGNOSIS — Z5189 Encounter for other specified aftercare: Secondary | ICD-10-CM | POA: Insufficient documentation

## 2012-10-12 DIAGNOSIS — I359 Nonrheumatic aortic valve disorder, unspecified: Secondary | ICD-10-CM | POA: Insufficient documentation

## 2012-10-12 DIAGNOSIS — I10 Essential (primary) hypertension: Secondary | ICD-10-CM | POA: Insufficient documentation

## 2012-10-12 DIAGNOSIS — I4891 Unspecified atrial fibrillation: Secondary | ICD-10-CM | POA: Insufficient documentation

## 2012-10-12 NOTE — Telephone Encounter (Signed)
Daughter called back and want to know if you had taken care of the prescription for her mother Metoprolol?

## 2012-10-12 NOTE — Telephone Encounter (Signed)
Rx was sent to pharmacy electronically. 

## 2012-10-12 NOTE — Progress Notes (Signed)
Cardiac Rehab Medication Review by a Pharmacist  Does the patient  feel that his/her medications are working for him/her?  yes  Has the patient been experiencing any side effects to the medications prescribed?  no  Does the patient measure his/her own blood pressure or blood glucose at home?  no   Does the patient have any problems obtaining medications due to transportation or finances?   no  Understanding of regimen: fair Understanding of indications: fair Potential of compliance: good    Pharmacist comments:  Patient is a 79 yo female who came with her daughter today, who helps with medications. They did not have a list of their medications today so will need to update later on when daughter brings it in next week. She currently does not take BP at home, but says SBP runs around 145-148. She seems to understand her medications fairly and will need reinforcement.     Starr Lake 10/12/2012 8:21 AM

## 2012-10-16 ENCOUNTER — Ambulatory Visit (INDEPENDENT_AMBULATORY_CARE_PROVIDER_SITE_OTHER): Payer: Medicare Other | Admitting: Physician Assistant

## 2012-10-16 VITALS — BP 125/75 | HR 69 | Resp 20 | Ht 63.0 in | Wt 161.0 lb

## 2012-10-16 DIAGNOSIS — Z952 Presence of prosthetic heart valve: Secondary | ICD-10-CM

## 2012-10-16 DIAGNOSIS — Z954 Presence of other heart-valve replacement: Secondary | ICD-10-CM

## 2012-10-16 DIAGNOSIS — I359 Nonrheumatic aortic valve disorder, unspecified: Secondary | ICD-10-CM

## 2012-10-16 NOTE — Progress Notes (Signed)
HPI: Patient returns for routine postoperative follow-up having undergone AVR on 08/30/2012. The patient's early postoperative recovery while in the hospital was notable for Atrial Fibrillation and Deconditioning.  She was discharged to [facility] for further rehab. Since hospital discharge the patient reports she is doing okay.  She continues to experience left sided breast pain around her implant.  The patient is wondering when this will go away.  She denies shortness of breath and chest pain.  She is ambulating without difficulty.  She has been evaluated by Dr. Allyson Sabal and at that time was maintaining NSR.  He is hopeful to discontinue her Amiodarone and Coumadin at her next visit.   Current Outpatient Prescriptions  Medication Sig Dispense Refill  . acetaminophen (TYLENOL) 325 MG tablet Take 2 tablets (650 mg total) by mouth every 6 (six) hours as needed.      Marland Kitchen amiodarone (PACERONE) 200 MG tablet Take 400 mg by mouth daily.       . Biotin 5000 MCG CAPS Take 1 capsule by mouth daily.      . Calcium Citrate-Vitamin D (CITRACAL + D PO) Take 1 tablet by mouth 2 (two) times daily.       . fesoterodine (TOVIAZ) 8 MG TB24 Take 8 mg by mouth daily.      . fexofenadine (ALLEGRA) 180 MG tablet Take 180 mg by mouth daily as needed (for allergies).      . hydrOXYzine (ATARAX/VISTARIL) 25 MG tablet Take 25 mg by mouth every 6 (six) hours as needed for itching.       . Magnesium Hydroxide (PHILLIPS MILK OF MAGNESIA PO) Take 15 mL by mouth. Take daily at 6 o'clock at night      . metoprolol tartrate (LOPRESSOR) 25 MG tablet Take 1 tablet (25 mg total) by mouth 3 (three) times daily.  90 tablet  11  . mometasone (NASONEX) 50 MCG/ACT nasal spray Place 2 sprays into the nose daily as needed.      Marland Kitchen omeprazole (PRILOSEC) 20 MG capsule TAKE 2 CAPSULES BY MOUTH DAILY FOR REFLUX  60 capsule  5  . shark liver oil-cocoa butter (PREPARATION H) 0.25-3-85.5 % suppository Place 1 suppository rectally as needed.      .  traMADol (ULTRAM) 50 MG tablet Take 1 tablet (50 mg total) by mouth every 6 (six) hours as needed.  50 tablet  0  . vitamin C (ASCORBIC ACID) 500 MG tablet Take 500 mg by mouth daily.      . vitamin E 400 UNIT capsule Take 400 Units by mouth daily.      Marland Kitchen warfarin (COUMADIN) 4 MG tablet Take 5 mg by mouth daily. Takes mediation at 2100       No current facility-administered medications for this visit.    Physical Exam:  BP 125/75  Pulse 69  Resp 20  Ht 5\' 3"  (1.6 m)  Wt 161 lb (73.029 kg)  BMI 28.53 kg/m2  SpO2 98%  Gen: no apparent distress Heart: RRR Lungs: CTA Incision; clean and dry, some minimal swelling around left breast  Diagnostic Tests:  CXR: no pleural effusion, stable post operative changes  EKG: NSR  A/P:  1. S/P AVR- patient is progressing well.  She does have continued complaints of pain around her left breast implant.  This could possibly be due to inflammation from surgery or fluid collection in the capsule from the implant.  She is maintaining NSR and will remain on Amiodarone and Coumadin for now.  She is  encouraged to start Cardiac rehab.  She was also instructed she could resume her regular activities as tolerated 2. RTC in 3 months, if no improvement in left sided breast pain will refer to Dr. Kelly Splinter for further evaluation of implant

## 2012-10-17 ENCOUNTER — Ambulatory Visit (INDEPENDENT_AMBULATORY_CARE_PROVIDER_SITE_OTHER): Payer: Medicare Other | Admitting: Pharmacist Clinician (PhC)/ Clinical Pharmacy Specialist

## 2012-10-17 ENCOUNTER — Ambulatory Visit (HOSPITAL_COMMUNITY)
Admission: RE | Admit: 2012-10-17 | Discharge: 2012-10-17 | Disposition: A | Discharge status: Home / Self Care | Payer: Medicare Other | Source: Ambulatory Visit | Attending: Cardiology | Admitting: Cardiology

## 2012-10-17 VITALS — BP 140/62 | HR 68

## 2012-10-17 DIAGNOSIS — I059 Rheumatic mitral valve disease, unspecified: Secondary | ICD-10-CM | POA: Insufficient documentation

## 2012-10-17 DIAGNOSIS — Z954 Presence of other heart-valve replacement: Secondary | ICD-10-CM | POA: Insufficient documentation

## 2012-10-17 DIAGNOSIS — I4891 Unspecified atrial fibrillation: Secondary | ICD-10-CM

## 2012-10-17 DIAGNOSIS — I350 Nonrheumatic aortic (valve) stenosis: Secondary | ICD-10-CM

## 2012-10-17 DIAGNOSIS — I359 Nonrheumatic aortic valve disorder, unspecified: Secondary | ICD-10-CM

## 2012-10-17 DIAGNOSIS — I079 Rheumatic tricuspid valve disease, unspecified: Secondary | ICD-10-CM | POA: Insufficient documentation

## 2012-10-17 DIAGNOSIS — I480 Paroxysmal atrial fibrillation: Secondary | ICD-10-CM

## 2012-10-17 DIAGNOSIS — Z7901 Long term (current) use of anticoagulants: Secondary | ICD-10-CM

## 2012-10-17 LAB — POCT INR: INR: 2.1

## 2012-10-17 MED ORDER — AMIODARONE HCL 200 MG PO TABS: 400.0000 mg | ORAL_TABLET | Freq: Every day | ORAL | Status: DC

## 2012-10-17 NOTE — Progress Notes (Signed)
2D Echo Performed 10/17/2012    Yenny Kosa, RCS  

## 2012-10-18 ENCOUNTER — Encounter (HOSPITAL_COMMUNITY)
Admission: RE | Admit: 2012-10-18 | Discharge: 2012-10-18 | Disposition: A | Discharge status: Home / Self Care | Payer: Medicare Other | Source: Ambulatory Visit | Attending: Cardiovascular Disease | Admitting: Cardiovascular Disease

## 2012-10-18 DIAGNOSIS — I4891 Unspecified atrial fibrillation: Secondary | ICD-10-CM | POA: Diagnosis not present

## 2012-10-18 DIAGNOSIS — I359 Nonrheumatic aortic valve disorder, unspecified: Secondary | ICD-10-CM | POA: Diagnosis not present

## 2012-10-18 DIAGNOSIS — Z5189 Encounter for other specified aftercare: Secondary | ICD-10-CM | POA: Diagnosis not present

## 2012-10-18 DIAGNOSIS — I10 Essential (primary) hypertension: Secondary | ICD-10-CM | POA: Diagnosis not present

## 2012-10-18 DIAGNOSIS — J449 Chronic obstructive pulmonary disease, unspecified: Secondary | ICD-10-CM | POA: Diagnosis not present

## 2012-10-18 DIAGNOSIS — Z79899 Other long term (current) drug therapy: Secondary | ICD-10-CM | POA: Diagnosis not present

## 2012-10-18 DIAGNOSIS — I739 Peripheral vascular disease, unspecified: Secondary | ICD-10-CM | POA: Diagnosis not present

## 2012-10-18 NOTE — Progress Notes (Signed)
Pt started cardiac rehab today.  Pt tolerated light exercise without difficulty. Telemetry rhythm Sinus. Vital signs stable. Will continue to monitor the patient throughout  the program.  

## 2012-10-19 ENCOUNTER — Encounter: Payer: Self-pay | Admitting: *Deleted

## 2012-10-20 ENCOUNTER — Encounter (HOSPITAL_COMMUNITY)
Admission: RE | Admit: 2012-10-20 | Discharge: 2012-10-20 | Disposition: A | Discharge status: Home / Self Care | Payer: Medicare Other | Source: Ambulatory Visit | Attending: Cardiovascular Disease | Admitting: Cardiovascular Disease

## 2012-10-20 DIAGNOSIS — I10 Essential (primary) hypertension: Secondary | ICD-10-CM | POA: Diagnosis not present

## 2012-10-20 DIAGNOSIS — J449 Chronic obstructive pulmonary disease, unspecified: Secondary | ICD-10-CM | POA: Diagnosis not present

## 2012-10-20 DIAGNOSIS — Z5189 Encounter for other specified aftercare: Secondary | ICD-10-CM | POA: Diagnosis not present

## 2012-10-20 DIAGNOSIS — I359 Nonrheumatic aortic valve disorder, unspecified: Secondary | ICD-10-CM | POA: Diagnosis not present

## 2012-10-20 DIAGNOSIS — I739 Peripheral vascular disease, unspecified: Secondary | ICD-10-CM | POA: Diagnosis not present

## 2012-10-20 DIAGNOSIS — I4891 Unspecified atrial fibrillation: Secondary | ICD-10-CM | POA: Diagnosis not present

## 2012-10-23 ENCOUNTER — Encounter (HOSPITAL_COMMUNITY)
Admission: RE | Admit: 2012-10-23 | Discharge: 2012-10-23 | Disposition: A | Discharge status: Home / Self Care | Payer: Medicare Other | Source: Ambulatory Visit | Attending: Cardiovascular Disease | Admitting: Cardiovascular Disease

## 2012-10-23 DIAGNOSIS — I10 Essential (primary) hypertension: Secondary | ICD-10-CM | POA: Diagnosis not present

## 2012-10-23 DIAGNOSIS — I359 Nonrheumatic aortic valve disorder, unspecified: Secondary | ICD-10-CM | POA: Diagnosis not present

## 2012-10-23 DIAGNOSIS — I4891 Unspecified atrial fibrillation: Secondary | ICD-10-CM | POA: Diagnosis not present

## 2012-10-23 DIAGNOSIS — I739 Peripheral vascular disease, unspecified: Secondary | ICD-10-CM | POA: Diagnosis not present

## 2012-10-23 DIAGNOSIS — J449 Chronic obstructive pulmonary disease, unspecified: Secondary | ICD-10-CM | POA: Diagnosis not present

## 2012-10-23 DIAGNOSIS — Z5189 Encounter for other specified aftercare: Secondary | ICD-10-CM | POA: Diagnosis not present

## 2012-10-24 ENCOUNTER — Other Ambulatory Visit (HOSPITAL_BASED_OUTPATIENT_CLINIC_OR_DEPARTMENT_OTHER): Payer: Self-pay | Admitting: Internal Medicine

## 2012-10-25 ENCOUNTER — Encounter (HOSPITAL_COMMUNITY)
Admission: RE | Admit: 2012-10-25 | Discharge: 2012-10-25 | Disposition: A | Discharge status: Home / Self Care | Payer: Medicare Other | Source: Ambulatory Visit | Attending: Cardiovascular Disease | Admitting: Cardiovascular Disease

## 2012-10-25 ENCOUNTER — Ambulatory Visit (INDEPENDENT_AMBULATORY_CARE_PROVIDER_SITE_OTHER): Payer: Medicare Other | Admitting: Pharmacist Clinician (PhC)/ Clinical Pharmacy Specialist

## 2012-10-25 VITALS — BP 150/68 | HR 64

## 2012-10-25 DIAGNOSIS — I739 Peripheral vascular disease, unspecified: Secondary | ICD-10-CM | POA: Diagnosis not present

## 2012-10-25 DIAGNOSIS — I4891 Unspecified atrial fibrillation: Secondary | ICD-10-CM | POA: Diagnosis not present

## 2012-10-25 DIAGNOSIS — I10 Essential (primary) hypertension: Secondary | ICD-10-CM | POA: Diagnosis not present

## 2012-10-25 DIAGNOSIS — I480 Paroxysmal atrial fibrillation: Secondary | ICD-10-CM

## 2012-10-25 DIAGNOSIS — Z7901 Long term (current) use of anticoagulants: Secondary | ICD-10-CM

## 2012-10-25 DIAGNOSIS — I359 Nonrheumatic aortic valve disorder, unspecified: Secondary | ICD-10-CM | POA: Diagnosis not present

## 2012-10-25 DIAGNOSIS — Z5189 Encounter for other specified aftercare: Secondary | ICD-10-CM | POA: Diagnosis not present

## 2012-10-25 DIAGNOSIS — J449 Chronic obstructive pulmonary disease, unspecified: Secondary | ICD-10-CM | POA: Diagnosis not present

## 2012-10-25 LAB — POCT INR: INR: 2.4

## 2012-10-25 NOTE — Progress Notes (Signed)
Reviewed home exercise with pt today.  Pt plans to walk at home for exercise.  Reviewed THR, pulse, RPE, sign and symptoms, and when to call 911 or MD.  Pt voiced understanding. Jessica Hawkins, MA, ACSM RCEP   

## 2012-10-27 ENCOUNTER — Encounter (HOSPITAL_COMMUNITY)
Admission: RE | Admit: 2012-10-27 | Discharge: 2012-10-27 | Disposition: A | Discharge status: Home / Self Care | Payer: Medicare Other | Source: Ambulatory Visit | Attending: Cardiovascular Disease | Admitting: Cardiovascular Disease

## 2012-10-27 DIAGNOSIS — I739 Peripheral vascular disease, unspecified: Secondary | ICD-10-CM | POA: Diagnosis not present

## 2012-10-27 DIAGNOSIS — Z5189 Encounter for other specified aftercare: Secondary | ICD-10-CM | POA: Diagnosis not present

## 2012-10-27 DIAGNOSIS — I10 Essential (primary) hypertension: Secondary | ICD-10-CM | POA: Diagnosis not present

## 2012-10-27 DIAGNOSIS — J449 Chronic obstructive pulmonary disease, unspecified: Secondary | ICD-10-CM | POA: Diagnosis not present

## 2012-10-27 DIAGNOSIS — I4891 Unspecified atrial fibrillation: Secondary | ICD-10-CM | POA: Diagnosis not present

## 2012-10-27 DIAGNOSIS — I359 Nonrheumatic aortic valve disorder, unspecified: Secondary | ICD-10-CM | POA: Diagnosis not present

## 2012-11-01 ENCOUNTER — Encounter (HOSPITAL_COMMUNITY)
Admission: RE | Admit: 2012-11-01 | Discharge: 2012-11-01 | Disposition: A | Discharge status: Home / Self Care | Payer: Medicare Other | Source: Ambulatory Visit | Attending: Cardiovascular Disease | Admitting: Cardiovascular Disease

## 2012-11-01 DIAGNOSIS — I739 Peripheral vascular disease, unspecified: Secondary | ICD-10-CM | POA: Insufficient documentation

## 2012-11-01 DIAGNOSIS — I10 Essential (primary) hypertension: Secondary | ICD-10-CM | POA: Insufficient documentation

## 2012-11-01 DIAGNOSIS — Z5189 Encounter for other specified aftercare: Secondary | ICD-10-CM | POA: Insufficient documentation

## 2012-11-01 DIAGNOSIS — Z79899 Other long term (current) drug therapy: Secondary | ICD-10-CM | POA: Insufficient documentation

## 2012-11-01 DIAGNOSIS — I4891 Unspecified atrial fibrillation: Secondary | ICD-10-CM | POA: Diagnosis not present

## 2012-11-01 DIAGNOSIS — J4489 Other specified chronic obstructive pulmonary disease: Secondary | ICD-10-CM | POA: Insufficient documentation

## 2012-11-01 DIAGNOSIS — J449 Chronic obstructive pulmonary disease, unspecified: Secondary | ICD-10-CM | POA: Diagnosis not present

## 2012-11-01 DIAGNOSIS — Z954 Presence of other heart-valve replacement: Secondary | ICD-10-CM | POA: Insufficient documentation

## 2012-11-01 DIAGNOSIS — I359 Nonrheumatic aortic valve disorder, unspecified: Secondary | ICD-10-CM | POA: Insufficient documentation

## 2012-11-03 ENCOUNTER — Encounter (HOSPITAL_COMMUNITY)
Admission: RE | Admit: 2012-11-03 | Discharge: 2012-11-03 | Disposition: A | Discharge status: Home / Self Care | Payer: Medicare Other | Source: Ambulatory Visit | Attending: Cardiovascular Disease | Admitting: Cardiovascular Disease

## 2012-11-06 ENCOUNTER — Encounter (HOSPITAL_COMMUNITY)
Admission: RE | Admit: 2012-11-06 | Discharge: 2012-11-06 | Disposition: A | Discharge status: Home / Self Care | Payer: Medicare Other | Source: Ambulatory Visit | Attending: Cardiovascular Disease | Admitting: Cardiovascular Disease

## 2012-11-07 ENCOUNTER — Ambulatory Visit (INDEPENDENT_AMBULATORY_CARE_PROVIDER_SITE_OTHER): Payer: Medicare Other | Admitting: Pharmacist Clinician (PhC)/ Clinical Pharmacy Specialist

## 2012-11-07 DIAGNOSIS — I4891 Unspecified atrial fibrillation: Secondary | ICD-10-CM

## 2012-11-07 DIAGNOSIS — I480 Paroxysmal atrial fibrillation: Secondary | ICD-10-CM

## 2012-11-07 DIAGNOSIS — Z7901 Long term (current) use of anticoagulants: Secondary | ICD-10-CM

## 2012-11-07 LAB — POCT INR: INR: 1.8

## 2012-11-08 ENCOUNTER — Encounter (HOSPITAL_COMMUNITY)
Admission: RE | Admit: 2012-11-08 | Discharge: 2012-11-08 | Disposition: A | Discharge status: Home / Self Care | Payer: Medicare Other | Source: Ambulatory Visit | Attending: Cardiovascular Disease | Admitting: Cardiovascular Disease

## 2012-11-08 ENCOUNTER — Ambulatory Visit: Payer: Medicare Other | Admitting: Pharmacist Clinician (PhC)/ Clinical Pharmacy Specialist

## 2012-11-09 ENCOUNTER — Other Ambulatory Visit (HOSPITAL_BASED_OUTPATIENT_CLINIC_OR_DEPARTMENT_OTHER): Payer: Self-pay | Admitting: Internal Medicine

## 2012-11-10 ENCOUNTER — Encounter (HOSPITAL_COMMUNITY)
Admission: RE | Admit: 2012-11-10 | Discharge: 2012-11-10 | Disposition: A | Discharge status: Home / Self Care | Payer: Medicare Other | Source: Ambulatory Visit | Attending: Cardiovascular Disease | Admitting: Cardiovascular Disease

## 2012-11-13 ENCOUNTER — Encounter (HOSPITAL_COMMUNITY)
Admission: RE | Admit: 2012-11-13 | Discharge: 2012-11-13 | Disposition: A | Discharge status: Home / Self Care | Payer: Medicare Other | Source: Ambulatory Visit | Attending: Cardiovascular Disease | Admitting: Cardiovascular Disease

## 2012-11-15 ENCOUNTER — Other Ambulatory Visit (HOSPITAL_BASED_OUTPATIENT_CLINIC_OR_DEPARTMENT_OTHER): Payer: Self-pay | Admitting: Internal Medicine

## 2012-11-15 ENCOUNTER — Encounter (HOSPITAL_COMMUNITY)
Admission: RE | Admit: 2012-11-15 | Discharge: 2012-11-15 | Disposition: A | Discharge status: Home / Self Care | Payer: Medicare Other | Source: Ambulatory Visit | Attending: Cardiovascular Disease | Admitting: Cardiovascular Disease

## 2012-11-17 ENCOUNTER — Encounter (HOSPITAL_COMMUNITY)
Admission: RE | Admit: 2012-11-17 | Discharge: 2012-11-17 | Disposition: A | Discharge status: Home / Self Care | Payer: Medicare Other | Source: Ambulatory Visit | Attending: Cardiovascular Disease | Admitting: Cardiovascular Disease

## 2012-11-20 ENCOUNTER — Encounter (HOSPITAL_COMMUNITY)
Admission: RE | Admit: 2012-11-20 | Discharge: 2012-11-20 | Disposition: A | Discharge status: Home / Self Care | Payer: Medicare Other | Source: Ambulatory Visit | Attending: Cardiovascular Disease | Admitting: Cardiovascular Disease

## 2012-11-22 ENCOUNTER — Ambulatory Visit (INDEPENDENT_AMBULATORY_CARE_PROVIDER_SITE_OTHER): Payer: Medicare Other | Admitting: Pharmacist Clinician (PhC)/ Clinical Pharmacy Specialist

## 2012-11-22 ENCOUNTER — Encounter (HOSPITAL_COMMUNITY)
Admission: RE | Admit: 2012-11-22 | Discharge: 2012-11-22 | Disposition: A | Discharge status: Home / Self Care | Payer: Medicare Other | Source: Ambulatory Visit | Attending: Cardiovascular Disease | Admitting: Cardiovascular Disease

## 2012-11-22 VITALS — BP 154/70 | HR 64

## 2012-11-22 DIAGNOSIS — Z7901 Long term (current) use of anticoagulants: Secondary | ICD-10-CM | POA: Diagnosis not present

## 2012-11-22 DIAGNOSIS — I480 Paroxysmal atrial fibrillation: Secondary | ICD-10-CM

## 2012-11-22 DIAGNOSIS — I4891 Unspecified atrial fibrillation: Secondary | ICD-10-CM | POA: Diagnosis not present

## 2012-11-22 LAB — POCT INR: INR: 2.2

## 2012-11-24 ENCOUNTER — Encounter (HOSPITAL_COMMUNITY)
Admission: RE | Admit: 2012-11-24 | Discharge: 2012-11-24 | Disposition: A | Discharge status: Home / Self Care | Payer: Medicare Other | Source: Ambulatory Visit | Attending: Cardiovascular Disease | Admitting: Cardiovascular Disease

## 2012-11-27 ENCOUNTER — Encounter (HOSPITAL_COMMUNITY)
Admission: RE | Admit: 2012-11-27 | Discharge: 2012-11-27 | Disposition: A | Discharge status: Home / Self Care | Payer: Medicare Other | Source: Ambulatory Visit | Attending: Cardiovascular Disease | Admitting: Cardiovascular Disease

## 2012-11-29 ENCOUNTER — Encounter (HOSPITAL_COMMUNITY)
Admission: RE | Admit: 2012-11-29 | Discharge: 2012-11-29 | Disposition: A | Discharge status: Home / Self Care | Payer: Medicare Other | Source: Ambulatory Visit | Attending: Cardiovascular Disease | Admitting: Cardiovascular Disease

## 2012-11-29 ENCOUNTER — Ambulatory Visit (INDEPENDENT_AMBULATORY_CARE_PROVIDER_SITE_OTHER): Payer: Medicare Other

## 2012-11-29 DIAGNOSIS — I4891 Unspecified atrial fibrillation: Secondary | ICD-10-CM | POA: Diagnosis not present

## 2012-11-29 DIAGNOSIS — I739 Peripheral vascular disease, unspecified: Secondary | ICD-10-CM | POA: Diagnosis not present

## 2012-11-29 DIAGNOSIS — I359 Nonrheumatic aortic valve disorder, unspecified: Secondary | ICD-10-CM | POA: Diagnosis not present

## 2012-11-29 DIAGNOSIS — J449 Chronic obstructive pulmonary disease, unspecified: Secondary | ICD-10-CM | POA: Diagnosis not present

## 2012-11-29 DIAGNOSIS — Z5189 Encounter for other specified aftercare: Secondary | ICD-10-CM | POA: Diagnosis not present

## 2012-11-29 DIAGNOSIS — Z23 Encounter for immunization: Secondary | ICD-10-CM | POA: Diagnosis not present

## 2012-11-29 DIAGNOSIS — I10 Essential (primary) hypertension: Secondary | ICD-10-CM | POA: Diagnosis not present

## 2012-11-29 DIAGNOSIS — Z79899 Other long term (current) drug therapy: Secondary | ICD-10-CM | POA: Diagnosis not present

## 2012-12-01 ENCOUNTER — Encounter (HOSPITAL_COMMUNITY)
Admission: RE | Admit: 2012-12-01 | Discharge: 2012-12-01 | Disposition: A | Discharge status: Home / Self Care | Payer: Medicare Other | Source: Ambulatory Visit | Attending: Cardiovascular Disease | Admitting: Cardiovascular Disease

## 2012-12-04 ENCOUNTER — Encounter (HOSPITAL_COMMUNITY)
Admission: RE | Admit: 2012-12-04 | Discharge: 2012-12-04 | Disposition: A | Discharge status: Home / Self Care | Payer: Medicare Other | Source: Ambulatory Visit | Attending: Cardiovascular Disease | Admitting: Cardiovascular Disease

## 2012-12-06 ENCOUNTER — Encounter (HOSPITAL_COMMUNITY)
Admission: RE | Admit: 2012-12-06 | Discharge: 2012-12-06 | Disposition: A | Discharge status: Home / Self Care | Payer: Medicare Other | Source: Ambulatory Visit | Attending: Cardiovascular Disease | Admitting: Cardiovascular Disease

## 2012-12-08 ENCOUNTER — Encounter (HOSPITAL_COMMUNITY)
Admission: RE | Admit: 2012-12-08 | Discharge: 2012-12-08 | Disposition: A | Discharge status: Home / Self Care | Payer: Medicare Other | Source: Ambulatory Visit | Attending: Cardiovascular Disease | Admitting: Cardiovascular Disease

## 2012-12-11 ENCOUNTER — Encounter (HOSPITAL_COMMUNITY)
Admission: RE | Admit: 2012-12-11 | Discharge: 2012-12-11 | Disposition: A | Discharge status: Home / Self Care | Payer: Medicare Other | Source: Ambulatory Visit | Attending: Cardiovascular Disease | Admitting: Cardiovascular Disease

## 2012-12-13 ENCOUNTER — Encounter (HOSPITAL_COMMUNITY)
Admission: RE | Admit: 2012-12-13 | Discharge: 2012-12-13 | Disposition: A | Discharge status: Home / Self Care | Payer: Medicare Other | Source: Ambulatory Visit | Attending: Cardiovascular Disease | Admitting: Cardiovascular Disease

## 2012-12-14 ENCOUNTER — Other Ambulatory Visit: Payer: Self-pay | Admitting: Pharmacist Clinician (PhC)/ Clinical Pharmacy Specialist

## 2012-12-14 ENCOUNTER — Ambulatory Visit (INDEPENDENT_AMBULATORY_CARE_PROVIDER_SITE_OTHER): Payer: Medicare Other | Admitting: Pharmacist Clinician (PhC)/ Clinical Pharmacy Specialist

## 2012-12-14 VITALS — BP 170/80 | HR 64

## 2012-12-14 DIAGNOSIS — Z7901 Long term (current) use of anticoagulants: Secondary | ICD-10-CM

## 2012-12-14 DIAGNOSIS — I480 Paroxysmal atrial fibrillation: Secondary | ICD-10-CM

## 2012-12-14 DIAGNOSIS — I4891 Unspecified atrial fibrillation: Secondary | ICD-10-CM

## 2012-12-14 LAB — POCT INR: INR: 2.8

## 2012-12-14 MED ORDER — WARFARIN SODIUM 4 MG PO TABS: ORAL_TABLET | ORAL | Status: DC

## 2012-12-14 MED ORDER — AMIODARONE HCL 200 MG PO TABS: 200.0000 mg | ORAL_TABLET | Freq: Two times a day (BID) | ORAL | Status: DC

## 2012-12-14 NOTE — Telephone Encounter (Signed)
Unclear by med list if pt was taking 200 or 400mg  qd.  MD note mentions "low dose" amiodarone, but pt states was told to take 2 qd, she prefers to take 1 bid.  Did not change directions at this time. KLA

## 2012-12-15 ENCOUNTER — Encounter (HOSPITAL_COMMUNITY)
Admission: RE | Admit: 2012-12-15 | Discharge: 2012-12-15 | Disposition: A | Discharge status: Home / Self Care | Payer: Medicare Other | Source: Ambulatory Visit | Attending: Cardiovascular Disease | Admitting: Cardiovascular Disease

## 2012-12-18 ENCOUNTER — Encounter (HOSPITAL_COMMUNITY)
Admission: RE | Admit: 2012-12-18 | Discharge: 2012-12-18 | Disposition: A | Discharge status: Home / Self Care | Payer: Medicare Other | Source: Ambulatory Visit | Attending: Cardiovascular Disease | Admitting: Cardiovascular Disease

## 2012-12-18 NOTE — Progress Notes (Signed)
[name] 79 y.o. female Nutrition Note Spoke with pt.  Nutrition Plan and Nutrition Survey goals reviewed with pt. Pt is following Step 2 of the Therapeutic Lifestyle Changes diet. Pt wants to lose wt. Pt has not been trying to lose wt. Wt loss tips reviewed. Pt is on Coumadin and is aware of the need for Consistent Vitamin K intake. Per nutrition screen, pt c/o constipation. Pt states her constipation is "managed OK at this time." Pt encouraged to drink plenty of fluids to help prevent constipation. Pt expressed understanding of the information reviewed. Pt aware of nutrition education classes offered and does not plan on attending nutrition classes.  Nutrition Diagnosis   Food-and nutrition-related knowledge deficit related to lack of exposure to information as related to diagnosis of: ? CVD ?    Overweight related to excessive energy intake as evidenced by a BMI of 28.7  Nutrition RX/ Estimated Daily Nutrition Needs for: wt loss  1200-1300 Kcal, 30-35 gm fat, 9-10 gm sat fat, 1.2-1.3 gm trans-fat, <1500 mg sodium   Nutrition Intervention   Pt's individual nutrition plan including cholesterol goals reviewed with pt.   Benefits of adopting Therapeutic Lifestyle Changes discussed when Medficts reviewed.   Pt to attend the Portion Distortion class   Continue client-centered nutrition education by RD, as part of interdisciplinary care.  Goal(s)   Pt to identify food quantities necessary to achieve: ? wt loss to a goal wt of 138-156 lb (62.5-70.7 kg) at graduation from cardiac rehab.    Pt to describe the benefit of including fruits, vegetables, whole grains, and low-fat dairy products in a heart healthy meal plan.  Monitor and Evaluate progress toward nutrition goal with team. Nutrition Risk:  Low   Mickle Plumb, M.Ed, RD, LDN, CDE 12/18/2012 11:01 AM

## 2012-12-19 ENCOUNTER — Telehealth: Payer: Self-pay | Admitting: Cardiovascular Disease

## 2012-12-19 NOTE — Telephone Encounter (Signed)
[name] talked to her about some medicine last week that would be cheaper for 90 day supply  Doesn't recall name of medicine but thinks it was [name] that called.  Please call

## 2012-12-20 ENCOUNTER — Encounter (HOSPITAL_COMMUNITY)
Admission: RE | Admit: 2012-12-20 | Discharge: 2012-12-20 | Disposition: A | Discharge status: Home / Self Care | Payer: Medicare Other | Source: Ambulatory Visit | Attending: Cardiovascular Disease | Admitting: Cardiovascular Disease

## 2012-12-20 NOTE — Telephone Encounter (Signed)
Not sure what patient is referring to.  Will plan to switch to lower strength tab at next visit, offered pt 90 day supply at that time.

## 2012-12-22 ENCOUNTER — Encounter (HOSPITAL_COMMUNITY)
Admission: RE | Admit: 2012-12-22 | Discharge: 2012-12-22 | Disposition: A | Discharge status: Home / Self Care | Payer: Medicare Other | Source: Ambulatory Visit | Attending: Cardiovascular Disease | Admitting: Cardiovascular Disease

## 2012-12-25 ENCOUNTER — Encounter (HOSPITAL_COMMUNITY)
Admission: RE | Admit: 2012-12-25 | Discharge: 2012-12-25 | Disposition: A | Discharge status: Home / Self Care | Payer: Medicare Other | Source: Ambulatory Visit | Attending: Cardiovascular Disease | Admitting: Cardiovascular Disease

## 2012-12-27 ENCOUNTER — Encounter (HOSPITAL_COMMUNITY)
Admission: RE | Admit: 2012-12-27 | Discharge: 2012-12-27 | Disposition: A | Discharge status: Home / Self Care | Payer: Medicare Other | Source: Ambulatory Visit | Attending: Cardiovascular Disease | Admitting: Cardiovascular Disease

## 2012-12-28 ENCOUNTER — Other Ambulatory Visit (HOSPITAL_BASED_OUTPATIENT_CLINIC_OR_DEPARTMENT_OTHER): Payer: Self-pay | Admitting: Nurse Practitioner

## 2012-12-28 ENCOUNTER — Other Ambulatory Visit (HOSPITAL_BASED_OUTPATIENT_CLINIC_OR_DEPARTMENT_OTHER): Payer: Self-pay | Admitting: Internal Medicine

## 2012-12-28 ENCOUNTER — Other Ambulatory Visit: Payer: Self-pay

## 2012-12-28 MED ORDER — FESOTERODINE FUMARATE ER 8 MG PO TB24: ORAL_TABLET | ORAL | Status: DC

## 2012-12-28 NOTE — Telephone Encounter (Signed)
Patient called requesting a refill on medication. Patient aware she is due for an office visit. Patient states she is recovering from open heart surgery. Patient instructed to call once recovered to schedule appointment

## 2012-12-29 ENCOUNTER — Encounter (HOSPITAL_COMMUNITY)
Admission: RE | Admit: 2012-12-29 | Discharge: 2012-12-29 | Disposition: A | Discharge status: Home / Self Care | Payer: Medicare Other | Source: Ambulatory Visit | Attending: Cardiovascular Disease | Admitting: Cardiovascular Disease

## 2013-01-01 ENCOUNTER — Encounter (HOSPITAL_COMMUNITY)
Admission: RE | Admit: 2013-01-01 | Discharge: 2013-01-01 | Disposition: A | Discharge status: Home / Self Care | Payer: Medicare Other | Source: Ambulatory Visit | Attending: Cardiovascular Disease | Admitting: Cardiovascular Disease

## 2013-01-01 DIAGNOSIS — I359 Nonrheumatic aortic valve disorder, unspecified: Secondary | ICD-10-CM | POA: Diagnosis not present

## 2013-01-01 DIAGNOSIS — Z5189 Encounter for other specified aftercare: Secondary | ICD-10-CM | POA: Insufficient documentation

## 2013-01-01 DIAGNOSIS — I739 Peripheral vascular disease, unspecified: Secondary | ICD-10-CM | POA: Diagnosis not present

## 2013-01-01 DIAGNOSIS — Z952 Presence of prosthetic heart valve: Secondary | ICD-10-CM | POA: Diagnosis not present

## 2013-01-01 DIAGNOSIS — I4891 Unspecified atrial fibrillation: Secondary | ICD-10-CM | POA: Diagnosis not present

## 2013-01-01 DIAGNOSIS — J449 Chronic obstructive pulmonary disease, unspecified: Secondary | ICD-10-CM | POA: Diagnosis not present

## 2013-01-01 DIAGNOSIS — Z79899 Other long term (current) drug therapy: Secondary | ICD-10-CM | POA: Insufficient documentation

## 2013-01-01 DIAGNOSIS — I10 Essential (primary) hypertension: Secondary | ICD-10-CM | POA: Insufficient documentation

## 2013-01-01 DIAGNOSIS — I059 Rheumatic mitral valve disease, unspecified: Secondary | ICD-10-CM | POA: Insufficient documentation

## 2013-01-01 DIAGNOSIS — J4489 Other specified chronic obstructive pulmonary disease: Secondary | ICD-10-CM | POA: Insufficient documentation

## 2013-01-02 ENCOUNTER — Encounter: Payer: Self-pay | Admitting: Cardiology

## 2013-01-02 ENCOUNTER — Ambulatory Visit (INDEPENDENT_AMBULATORY_CARE_PROVIDER_SITE_OTHER): Payer: Medicare Other | Admitting: Cardiology

## 2013-01-02 VITALS — BP 140/80 | HR 60 | Ht 64.0 in | Wt 155.0 lb

## 2013-01-02 DIAGNOSIS — I10 Essential (primary) hypertension: Secondary | ICD-10-CM | POA: Diagnosis not present

## 2013-01-02 DIAGNOSIS — IMO0001 Reserved for inherently not codable concepts without codable children: Secondary | ICD-10-CM

## 2013-01-02 DIAGNOSIS — Z853 Personal history of malignant neoplasm of breast: Secondary | ICD-10-CM | POA: Diagnosis not present

## 2013-01-02 DIAGNOSIS — I4891 Unspecified atrial fibrillation: Secondary | ICD-10-CM | POA: Diagnosis not present

## 2013-01-02 DIAGNOSIS — Z7901 Long term (current) use of anticoagulants: Secondary | ICD-10-CM

## 2013-01-02 DIAGNOSIS — Z0389 Encounter for observation for other suspected diseases and conditions ruled out: Secondary | ICD-10-CM

## 2013-01-02 DIAGNOSIS — I480 Paroxysmal atrial fibrillation: Secondary | ICD-10-CM

## 2013-01-02 DIAGNOSIS — I350 Nonrheumatic aortic (valve) stenosis: Secondary | ICD-10-CM

## 2013-01-02 DIAGNOSIS — I359 Nonrheumatic aortic valve disorder, unspecified: Secondary | ICD-10-CM | POA: Diagnosis not present

## 2013-01-02 NOTE — Assessment & Plan Note (Signed)
Vs stable.

## 2013-01-02 NOTE — Assessment & Plan Note (Signed)
Stable, mild SOB with exertion but she feels this may be improving with cardiac rehab.

## 2013-01-02 NOTE — Assessment & Plan Note (Addendum)
Post op atrial fib maintaining SR.  Discussed briefly with Dr. Allyson Sabal will stop amiodarone and coumadin then do monitor for 2 weeks to ensure no atrial fib.

## 2013-01-02 NOTE — Assessment & Plan Note (Signed)
Will stop coumadin, maintaining SR.

## 2013-01-02 NOTE — Assessment & Plan Note (Signed)
No chest pain

## 2013-01-02 NOTE — Assessment & Plan Note (Signed)
No recurrence. 

## 2013-01-02 NOTE — Progress Notes (Signed)
01/02/2013   PCP: Oneal Grout, MD   Chief Complaint  Patient presents with  . Follow-up    6 month visit    Primary Cardiologist: Dr. Allyson Sabal  HPI:  79 year old mildly overweight married Caucasian female accompanied by her husband, who is also a patient of Dr. Hazle Coca that I am also seeing today. She is a mother of 2, grandmother to 1 grandchild. Her risk factors include hypertension and family history. A brother died at age 70 from heart-related issues while he was being operated on. She has never had a heart attack or stroke. 2-D echo was performed showed critical aortic stenosis the valve area 0.5 cm. Based on this the patient underwent right left heart cardiac catheterization by Dr. Allyson Sabal revealing normal coronaries and normal left function. She had critical aortic stenosis and ultimately underwent bioprosthetic aortic valve replacement by Dr. Cornelius Moras on 08/30/12 with an Edwards magna ease pericardial tissue valve (21 mm) excellent result. Her postop course was accompanied by nausea and paroxysmal atrial fibrillation for which she was started with low dose amiodarone and Coumadin anticoagulation.  On her last visit with Dr. Allyson Sabal plans were to see her back in 3 months and stop amiodarone and coumadin.    Today she has no complaints, mild SOB with exertion but that has improved with cardiac rehab.  No awareness of fast or irregular heart rate.  No chest pain.      Allergies  Allergen Reactions  . Codeine Rash    All over the body.    Current Outpatient Prescriptions  Medication Sig Dispense Refill  . acetaminophen (TYLENOL) 325 MG tablet Take 2 tablets (650 mg total) by mouth every 6 (six) hours as needed.      Marland Kitchen amiodarone (PACERONE) 200 MG tablet Take 1 tablet (200 mg total) by mouth 2 (two) times daily.  60 tablet  3  . Biotin 5000 MCG CAPS Take 1 capsule by mouth daily.      . Calcium Citrate-Vitamin D (CITRACAL + D PO) Take 1 tablet by mouth 2 (two) times daily.        . fesoterodine (TOVIAZ) 8 MG TB24 tablet TAKE 1 TABLET BY MOUTH EVERY DAY  30 tablet  1  . fexofenadine (ALLEGRA) 180 MG tablet Take 180 mg by mouth daily as needed (for allergies).      . hydrOXYzine (ATARAX/VISTARIL) 25 MG tablet Take 25 mg by mouth every 6 (six) hours as needed for itching.       . Magnesium Hydroxide (PHILLIPS MILK OF MAGNESIA PO) Take 15 mL by mouth. Take daily at 6 o'clock at night      . metoprolol tartrate (LOPRESSOR) 25 MG tablet Take 25 mg by mouth 2 (two) times daily.      . mometasone (NASONEX) 50 MCG/ACT nasal spray Place 2 sprays into the nose daily as needed.      Marland Kitchen omeprazole (PRILOSEC) 20 MG capsule TAKE 2 CAPSULES BY MOUTH DAILY FOR REFLUX  60 capsule  3  . shark liver oil-cocoa butter (PREPARATION H) 0.25-3-85.5 % suppository Place 1 suppository rectally as needed.      . TOVIAZ 8 MG TB24 tablet TAKE 1 TABLET BY MOUTH EVERY DAY  30 tablet  1  . traMADol (ULTRAM) 50 MG tablet Take 1 tablet (50 mg total) by mouth every 6 (six) hours as needed.  50 tablet  0  . vitamin C (ASCORBIC ACID) 500 MG tablet Take 500 mg by  mouth daily.      . vitamin E 400 UNIT capsule Take 400 Units by mouth daily.      Marland Kitchen warfarin (COUMADIN) 4 MG tablet Take 1 tablet by mouth daily or as directed  30 tablet  2   No current facility-administered medications for this visit.    Past Medical History  Diagnosis Date  . Arthritis   . Hyperlipidemia   . Heart murmur   . Hearing loss   . Change in voice   . Osteoporosis   . Cancer     Breast  . Aortic valve disorder 08/13/2011    ECHO - EF >55%; mild diastolic dysfunction; calcified aortic valve, not well visualized; mod/severe aortic stenosis w/ worsening gradients when compared to 2012  . PVD (peripheral vascular disease) 08/13/2010    R/P MV - normal pattern of perfusion in all regions, EF 76%; no significant wall abnormalities noted; normal perfusion study  . Carotid bruit 08/06/2008    Doppler - R ECA demonstrates noarrowing w/  elevated velocities consistent w/ >70% diameter reduction; R and L ICAs show no evidence of diameter reduction, significant tortuosity or vascular abnormality;   . Aortic stenosis 07/20/2012    Aortic stenosis   . Hypertension     dr Allyson Sabal  . S/P aortic valve replacement with bioprosthetic valve 08/30/2012    21 mm [facility] Ease bovine pericardial tissue valve  . PAF (paroxysmal atrial fibrillation) 09/04/2012    Past Surgical History  Procedure Laterality Date  . Breast lumpectomy  02/25/1997    right  . Mastectomy  09/29/95    left  . Total hip arthroplasty  09/2010  . Colon surgery  1959  . Abdominal hysterectomy      Partial  . Eye surgery  1997    Cataract surgery  . Biopsy shoulder Left 10/08/2005    shave biopsy  . Cosmetic surgery Left 1997    Breast implant  . Left heart cath  08/14/12    Nl cors, AS  . Cardiac catheterization    . Aortic valve replacement N/A 08/30/2012    Procedure: AORTIC VALVE REPLACEMENT (AVR);  Surgeon: Purcell Nails, MD;  Location: [facility] OR;  Service: Open Heart Surgery;  Laterality: N/A;  . Intraoperative transesophageal echocardiogram N/A 08/30/2012    Procedure: INTRAOPERATIVE TRANSESOPHAGEAL ECHOCARDIOGRAM;  Surgeon: Purcell Nails, MD;  Location: [facility] OR;  Service: Open Heart Surgery;  Laterality: N/A;    ZOX:WRUEAVW:UJ colds or fevers, no weight changes Skin:no rashes or ulcers HEENT:no blurred vision, no congestion CV:see HPI PUL:see HPI GI:no diarrhea constipation or melena, no indigestion GU:no hematuria, no dysuria MS:no joint pain, no claudication Neuro:no syncope, no lightheadedness Endo:no diabetes, no thyroid disease  PHYSICAL EXAM BP 140/80  Pulse 60  Ht 5\' 4"  (1.626 m)  Wt 155 lb (70.308 kg)  BMI 26.59 kg/m2 General:Pleasant affect, NAD Skin:Warm and dry, brisk capillary refill HEENT:normocephalic, sclera clear, mucus membranes moist Neck:supple, no JVD, bil soft carotid bruits  Heart:S1S2 RRR with1/6 systolic murmur, no  gallup, rub or click Lungs:clear without rales, rhonchi, or wheezes WJX:BJYN, non tender, + BS, do not palpate liver spleen or masses Ext:no lower ext edema, 2+ pedal pulses, 2+ radial pulses Neuro:alert and oriented, MAE, follows commands, + facial symmetry  EKG:SR rate of 60 no acute changes  ASSESSMENT AND PLAN Aortic stenosis- critical AS at cath 08/14/12, with surgery Stable, mild SOB with exertion but she feels this may be improving with cardiac rehab.  PAF (paroxysmal atrial  fibrillation) Post op atrial fib maintaining SR.  Discussed briefly with Dr. Allyson Sabal will stop amiodarone and coumadin then do monitor for 2 weeks to ensure no atrial fib.  HTN (hypertension) Vs stable  Hx Left Breast cancer, DCIS No recurrence  Normal coronary arteries-6/14 No chest pain  Long term (current) use of anticoagulants Will stop coumadin, maintaining SR.

## 2013-01-02 NOTE — Patient Instructions (Addendum)
Stop Amiodarone and coumadin.    Call if you have fast heart rate or you become aware of heart rate.  We will have you wear a monitor for 2 weeks to make sure no irregular heart rate. Follow up with Dr. Allyson Sabal in 3 months.   You should not pick up more than 15 pounds at a time.

## 2013-01-03 ENCOUNTER — Encounter (HOSPITAL_COMMUNITY)
Admission: RE | Admit: 2013-01-03 | Discharge: 2013-01-03 | Disposition: A | Discharge status: Home / Self Care | Payer: Medicare Other | Source: Ambulatory Visit | Attending: Cardiovascular Disease | Admitting: Cardiovascular Disease

## 2013-01-03 DIAGNOSIS — I4891 Unspecified atrial fibrillation: Secondary | ICD-10-CM | POA: Diagnosis not present

## 2013-01-03 DIAGNOSIS — I10 Essential (primary) hypertension: Secondary | ICD-10-CM | POA: Diagnosis not present

## 2013-01-03 DIAGNOSIS — I359 Nonrheumatic aortic valve disorder, unspecified: Secondary | ICD-10-CM | POA: Diagnosis not present

## 2013-01-03 DIAGNOSIS — I739 Peripheral vascular disease, unspecified: Secondary | ICD-10-CM | POA: Diagnosis not present

## 2013-01-03 DIAGNOSIS — J449 Chronic obstructive pulmonary disease, unspecified: Secondary | ICD-10-CM | POA: Diagnosis not present

## 2013-01-03 DIAGNOSIS — Z5189 Encounter for other specified aftercare: Secondary | ICD-10-CM | POA: Diagnosis not present

## 2013-01-05 ENCOUNTER — Encounter (HOSPITAL_COMMUNITY)
Admission: RE | Admit: 2013-01-05 | Discharge: 2013-01-05 | Disposition: A | Discharge status: Home / Self Care | Payer: Medicare Other | Source: Ambulatory Visit | Attending: Cardiovascular Disease | Admitting: Cardiovascular Disease

## 2013-01-05 DIAGNOSIS — I739 Peripheral vascular disease, unspecified: Secondary | ICD-10-CM | POA: Diagnosis not present

## 2013-01-05 DIAGNOSIS — I4891 Unspecified atrial fibrillation: Secondary | ICD-10-CM | POA: Diagnosis not present

## 2013-01-05 DIAGNOSIS — I10 Essential (primary) hypertension: Secondary | ICD-10-CM | POA: Diagnosis not present

## 2013-01-05 DIAGNOSIS — I359 Nonrheumatic aortic valve disorder, unspecified: Secondary | ICD-10-CM | POA: Diagnosis not present

## 2013-01-05 DIAGNOSIS — J449 Chronic obstructive pulmonary disease, unspecified: Secondary | ICD-10-CM | POA: Diagnosis not present

## 2013-01-05 DIAGNOSIS — Z5189 Encounter for other specified aftercare: Secondary | ICD-10-CM | POA: Diagnosis not present

## 2013-01-08 ENCOUNTER — Encounter (HOSPITAL_COMMUNITY)
Admission: RE | Admit: 2013-01-08 | Discharge: 2013-01-08 | Disposition: A | Discharge status: Home / Self Care | Payer: Medicare Other | Source: Ambulatory Visit | Attending: Cardiovascular Disease | Admitting: Cardiovascular Disease

## 2013-01-08 DIAGNOSIS — I10 Essential (primary) hypertension: Secondary | ICD-10-CM | POA: Diagnosis not present

## 2013-01-08 DIAGNOSIS — J449 Chronic obstructive pulmonary disease, unspecified: Secondary | ICD-10-CM | POA: Diagnosis not present

## 2013-01-08 DIAGNOSIS — I739 Peripheral vascular disease, unspecified: Secondary | ICD-10-CM | POA: Diagnosis not present

## 2013-01-08 DIAGNOSIS — I4891 Unspecified atrial fibrillation: Secondary | ICD-10-CM | POA: Diagnosis not present

## 2013-01-08 DIAGNOSIS — Z5189 Encounter for other specified aftercare: Secondary | ICD-10-CM | POA: Diagnosis not present

## 2013-01-08 DIAGNOSIS — I359 Nonrheumatic aortic valve disorder, unspecified: Secondary | ICD-10-CM | POA: Diagnosis not present

## 2013-01-10 ENCOUNTER — Encounter (HOSPITAL_COMMUNITY)
Admission: RE | Admit: 2013-01-10 | Discharge: 2013-01-10 | Disposition: A | Discharge status: Home / Self Care | Payer: Medicare Other | Source: Ambulatory Visit | Attending: Cardiovascular Disease | Admitting: Cardiovascular Disease

## 2013-01-10 ENCOUNTER — Telehealth: Payer: Self-pay | Admitting: *Deleted

## 2013-01-10 DIAGNOSIS — I4891 Unspecified atrial fibrillation: Secondary | ICD-10-CM | POA: Diagnosis not present

## 2013-01-10 DIAGNOSIS — I10 Essential (primary) hypertension: Secondary | ICD-10-CM | POA: Diagnosis not present

## 2013-01-10 DIAGNOSIS — I359 Nonrheumatic aortic valve disorder, unspecified: Secondary | ICD-10-CM | POA: Diagnosis not present

## 2013-01-10 DIAGNOSIS — Z5189 Encounter for other specified aftercare: Secondary | ICD-10-CM | POA: Diagnosis not present

## 2013-01-10 DIAGNOSIS — J449 Chronic obstructive pulmonary disease, unspecified: Secondary | ICD-10-CM | POA: Diagnosis not present

## 2013-01-10 DIAGNOSIS — I739 Peripheral vascular disease, unspecified: Secondary | ICD-10-CM | POA: Diagnosis not present

## 2013-01-10 NOTE — Telephone Encounter (Signed)
Byrd Hesselbach called from Cardiac rehab and stated that Ms. [name] still has not received any information about a monitor and has not received one in the mail. Byrd Hesselbach asked if someone could call her back before 11 and let her know so that she can tell the pt. The pt is there with them until 11.

## 2013-01-10 NOTE — Telephone Encounter (Signed)
Returned call and spoke w/ [name].  Informed monitors have been on backorder and are being shipped as quickly as possible.  Informed pt will have to be pt as the office does not have control over the supply available.  Verbalized understanding and will inform pt.

## 2013-01-10 NOTE — Progress Notes (Signed)
[employer] today. Naylea plans to continue exercise on her own via walking.

## 2013-01-11 ENCOUNTER — Ambulatory Visit (INDEPENDENT_AMBULATORY_CARE_PROVIDER_SITE_OTHER): Payer: Medicare Other | Admitting: Pharmacist Clinician (PhC)/ Clinical Pharmacy Specialist

## 2013-01-11 VITALS — BP 130/62 | HR 68

## 2013-01-11 DIAGNOSIS — I4891 Unspecified atrial fibrillation: Secondary | ICD-10-CM | POA: Diagnosis not present

## 2013-01-11 DIAGNOSIS — Z7901 Long term (current) use of anticoagulants: Secondary | ICD-10-CM

## 2013-01-11 DIAGNOSIS — I480 Paroxysmal atrial fibrillation: Secondary | ICD-10-CM

## 2013-01-11 LAB — POCT INR: INR: 2.9

## 2013-01-12 ENCOUNTER — Encounter: Payer: Self-pay | Admitting: Thoracic Surgery (Cardiothoracic Vascular Surgery)

## 2013-01-12 ENCOUNTER — Encounter (HOSPITAL_COMMUNITY): Admission: RE | Admit: 2013-01-12 | Payer: Medicare Other | Source: Ambulatory Visit

## 2013-01-12 ENCOUNTER — Ambulatory Visit (INDEPENDENT_AMBULATORY_CARE_PROVIDER_SITE_OTHER): Payer: Medicare Other | Admitting: Thoracic Surgery (Cardiothoracic Vascular Surgery)

## 2013-01-12 VITALS — BP 180/80 | HR 60 | Resp 20 | Ht 63.0 in | Wt 155.0 lb

## 2013-01-12 DIAGNOSIS — I350 Nonrheumatic aortic (valve) stenosis: Secondary | ICD-10-CM

## 2013-01-12 DIAGNOSIS — Z952 Presence of prosthetic heart valve: Secondary | ICD-10-CM | POA: Diagnosis not present

## 2013-01-12 DIAGNOSIS — N644 Mastodynia: Secondary | ICD-10-CM

## 2013-01-12 DIAGNOSIS — I359 Nonrheumatic aortic valve disorder, unspecified: Secondary | ICD-10-CM | POA: Diagnosis not present

## 2013-01-12 DIAGNOSIS — Z954 Presence of other heart-valve replacement: Secondary | ICD-10-CM

## 2013-01-12 DIAGNOSIS — Z953 Presence of xenogenic heart valve: Secondary | ICD-10-CM

## 2013-01-12 NOTE — Progress Notes (Signed)
[address]       [name] [address]             [phone]     CARDIOTHORACIC SURGERY OFFICE NOTE  Referring Provider is Nanetta Batty, MD PCP is Oneal Grout, MD   HPI:  Patient returns for routine followup status post aortic valve replacement using a bioprosthetic tissue valve on 08/30/2012.  Her early postoperative recovery was notable for postoperative atrial fibrillation for which she was treated with amiodarone and Coumadin. She was last seen here in our office on 10/16/2012. Since then she has continued to do well. She is maintaining sinus rhythm and has been taken off of both amiodarone and Coumadin. She is been followed closely through Dr. Hazle Coca office. She is completed the cardiac rehabilitation program and made excellent progress. She returns to the office today for routine followup. She reports that she no longer has any significant pain in her chest. Her exercise tolerance and breathing is much better than it was prior to surgery. Overall she has no complaints.   Current Outpatient Prescriptions  Medication Sig Dispense Refill  . acetaminophen (TYLENOL) 325 MG tablet Take 2 tablets (650 mg total) by mouth every 6 (six) hours as needed.      . Biotin 5000 MCG CAPS Take 1 capsule by mouth daily.      . Calcium Citrate-Vitamin D (CITRACAL + D PO) Take 1 tablet by mouth 2 (two) times daily.       . fexofenadine (ALLEGRA) 180 MG tablet Take 180 mg by mouth daily as needed (for allergies).      . hydrOXYzine (ATARAX/VISTARIL) 25 MG tablet Take 25 mg by mouth every 6 (six) hours as needed for itching.       . Magnesium Hydroxide (PHILLIPS MILK OF MAGNESIA PO) Take 15 mL by mouth. Take daily at 6 o'clock at night      . metoprolol tartrate (LOPRESSOR) 25 MG tablet Take 25 mg by mouth 2 (two) times daily.      . mometasone (NASONEX) 50 MCG/ACT nasal spray Place 2 sprays into the nose daily as needed.      Marland Kitchen omeprazole (PRILOSEC) 20 MG capsule TAKE 2  CAPSULES BY MOUTH DAILY FOR REFLUX  60 capsule  3  . shark liver oil-cocoa butter (PREPARATION H) 0.25-3-85.5 % suppository Place 1 suppository rectally as needed.      . TOVIAZ 8 MG TB24 tablet TAKE 1 TABLET BY MOUTH EVERY DAY  30 tablet  1  . vitamin C (ASCORBIC ACID) 500 MG tablet Take 500 mg by mouth daily.      . vitamin E 400 UNIT capsule Take 400 Units by mouth daily.       No current facility-administered medications for this visit.      Physical Exam:   BP 180/80  Pulse 60  Resp 20  Ht 5\' 3"  (1.6 m)  Wt 155 lb (70.308 kg)  BMI 27.46 kg/m2  SpO2 98%  General:  Elderly and frail but well-appearing  Chest:   Clear to auscultation  CV:   Regular rate and rhythm with soft systolic murmur  Incisions:  Completely healed, sternum is stable  Abdomen:  Soft and nontender  Extremities:  Warm and well-perfused  Diagnostic Tests:  Transthoracic Echocardiography  Patient: Dail, Meece MR #: [id number] Study Date: 10/17/2012 Gender: F Age: 79 Height: 160cm Weight: 73.5kg BSA: 1.48m^2 Pt. Status: Room:  ORDERING Nanetta Batty, MD REFERRING Nanetta Batty, MD  ATTENDING Loann Quill, Mahima SONOGRAPHER Clearence Ped, RCS PERFORMING [facility] cc:  ------------------------------------------------------------ LV EF: 55% - 60%  ------------------------------------------------------------ Indications: V43.3 S/P Heart valve replacement.  ------------------------------------------------------------ History: Risk factors: Family history of coronary artery disease. Hypertension.  ------------------------------------------------------------ Study Conclusions  - Procedure narrative: Transthoracic echocardiography. Image quality was poordue to artifacts generated by breast implants. - Left ventricle: The cavity size was normal. There was mild concentric hypertrophy. Systolic function was normal. The estimated ejection fraction was in the range of  55% to 60%. Doppler parameters are consistent with abnormal left ventricular relaxation (grade 1 diastolic dysfunction). The E/e' ratio is >10, suggesting elevated LV filling pressure. - Aortic valve: There is an unknown (bioprosthetic) valve in the aortic position. There is no signficant stenosis - the peak gradient is 17 mmHg, which is acceptable. Valve area: 1.61cm^2(VTI). Valve area: 1.69cm^2 (Vmax). - Mitral valve: Calcified annulus. Mildly thickened leaflets . Mild regurgitation. - Left atrium: LA volume/ BSA = 28.8 ml/m2 The atrium was mildly dilated. - Right atrium: The atrium was mildly dilated. - Tricuspid valve: Moderate regurgitation. - Pulmonary arteries: PA peak pressure: 32mm Hg (S). - Inferior vena cava: The vessel was normal in size; the respirophasic diameter changes were in the normal range (= 50%); findings are consistent with normal central venous pressure. Transthoracic echocardiography. M-mode, complete 2D, spectral Doppler, and color Doppler. Height: Height: 160cm. Height: 63in. Weight: Weight: 73.5kg. Weight: 161.7lb. Body mass index: BMI: 28.7kg/m^2. Body surface area: BSA: 1.55m^2. Blood pressure: 146/82. Patient status: Outpatient. Location: Echo laboratory.  ------------------------------------------------------------  ------------------------------------------------------------ Left ventricle: The cavity size was normal. There was mild concentric hypertrophy. Systolic function was normal. The estimated ejection fraction was in the range of 55% to 60%. Images were inadequate for LV wall motion assessment. Doppler parameters are consistent with abnormal left ventricular relaxation (grade 1 diastolic dysfunction). The E/e' ratio is >10, suggesting elevated LV filling pressure.  ------------------------------------------------------------ Aortic valve: There is an unknown (bioprosthetic) valve in the aortic position. There is no signficant stenosis -  the peak gradient is 17 mmHg, which is acceptable. Doppler: VTI ratio of LVOT to aortic valve: 0.57. Valve area: 1.61cm^2(VTI). Indexed valve area: 0.91cm^2/m^2 (VTI). Peak velocity ratio of LVOT to aortic valve: 0.6. Valve area: 1.69cm^2 (Vmax). Indexed valve area: 0.95cm^2/m^2 (Vmax). Mean gradient: 7mm Hg (S). Peak gradient: 12mm Hg (S).  ------------------------------------------------------------ Aorta: Aortic root: The aortic root was normal in size. Ascending aorta: The ascending aorta was normal in size.  ------------------------------------------------------------ Mitral valve: Calcified annulus. Mildly thickened leaflets . Doppler: Mild regurgitation. Valve area by pressure half-time: 3.86cm^2. Indexed valve area by pressure half-time: 2.18cm^2/m^2. Indexed valve area by continuity equation (using LVOT flow): 1.02cm^2/m^2. Mean gradient: 3mm Hg (D). Peak gradient: 8mm Hg (D).  ------------------------------------------------------------ Left atrium: LA volume/ BSA = 28.8 ml/m2 The atrium was mildly dilated.  ------------------------------------------------------------ Atrial septum: Poorly visualized.  ------------------------------------------------------------ Right ventricle: The cavity size was normal. Wall thickness was normal. Systolic function was normal.  ------------------------------------------------------------ Pulmonic valve: Poorly visualized. Doppler: No significant regurgitation.  ------------------------------------------------------------ Tricuspid valve: Doppler: Moderate regurgitation.  ------------------------------------------------------------ Pulmonary artery: Poorly visualized.  ------------------------------------------------------------ Right atrium: The atrium was mildly dilated.  ------------------------------------------------------------ Systemic veins: Inferior vena cava: The vessel was normal in size; the respirophasic diameter  changes were in the normal range (= 50%); findings are consistent with normal central venous pressure. Diameter: 16.51mm.  ------------------------------------------------------------  2D measurements Normal Doppler measurements Normal IVC Main pulmonary Diam 16.5 mm ------ artery Left ventricle Pressure, 32 mm Hg =30 LVID ED, 39.7  mm 43-52 S chord, Left ventricle PLAX Ea, lat 6.8 cm/s ------ LVID ES, 25.2 mm 23-38 ann, tiss chord, DP PLAX E/Ea, lat 12.4 ------ FS, chord, 37 % >29 ann, tiss 1 PLAX DP LVPW, ED 13.7 mm ------ Ea, med 4.6 cm/s ------ IVS/LVPW 0.73 <1.3 ann, tiss ratio, ED DP Ventricular septum E/Ea, med 18.3 ------ IVS, ED 9.97 mm ------ ann, tiss 5 LVOT DP Diam, S 19 mm ------ LVOT Area 2.84 cm^2 ------ Peak vel, 103 cm/s ------ Aorta S Root diam, 21 mm ------ VTI, S 23.7 cm ------ ED Aortic valve Left atrium Peak vel, 173 cm/s ------ AP dim 39 mm ------ S AP dim 2.2 cm/m^2 <2.2 Mean vel, 125 cm/s ------ index S Right ventricle VTI, S 41.8 cm ------ RVID ED, 33.3 mm 19-38 Mean 7 mm Hg ------ PLAX gradient, S Peak 12 mm Hg ------ gradient, S VTI ratio 0.57 ------ LVOT/AV Area, VTI 1.61 cm^2 ------ Area index 0.91 cm^2/m ------ (VTI) ^2 Peak vel 0.6 ------ ratio, LVOT/AV Area, Vmax 1.69 cm^2 ------ Area index 0.95 cm^2/m ------ (Vmax) ^2 Mitral valve Peak E vel 84.4 cm/s ------ Peak A vel 88.8 cm/s ------ Mean vel, 78.8 cm/s ------ D Decelerati 201 ms 150-23 on time 0 Pressure 57 ms ------ half-time Mean 3 mm Hg ------ gradient, D Peak 8 mm Hg ------ gradient, D Peak E/A 1 ------ ratio Area (PHT) 3.86 cm^2 ------ Area index 2.18 cm^2/m ------ (PHT) ^2 Area index 1.02 cm^2/m ------ (LVOT ^2 cont) Annulus 37.3 cm ------ VTI Tricuspid valve Regurg 262 cm/s ------ peak vel Peak RV-RA 27 mm Hg ------ gradient, S Systemic veins Estimated 5 mm Hg ------ CVP Right ventricle Pressure, 32 mm Hg <30 S Sa vel, 12.7 cm/s  ------ lat ann, tiss DP  ------------------------------------------------------------ Prepared and Electronically Authenticated by  Zoila Shutter 2014-08-20T12:45:11.243    Impression:  Patient is doing well more than 4 months status post aortic valve replacement using a bioprosthetic tissue valve. She is clinically doing quite well and late followup echocardiogram looks good with normal functioning valve and good left ventricular systolic function. Her blood pressure is slightly elevated here in the office today.    Plan:  I've encouraged patient to continue to increase her activity without any particular limitations at this time. I have suggested that the patient should keep in her blood pressure and report or findings to Dr. Glade Lloyd and Dr. Allyson Sabal to consider further adjustment in her blood pressure medications as indicated. We have not made any changes in her medications today. In the future the patient will call and return to see [place] as needed. All of her questions have been addressed.   Salvatore Decent. Cornelius Moras, MD 01/12/2013 1:15 PM

## 2013-01-12 NOTE — Patient Instructions (Addendum)
Patient is advised to measure blood pressure daily and report results to Dr Glade Lloyd and/or Dr Allyson Sabal to consider adjustment to blood pressure medications.  Patient may resume normal physical activity without any particular limitations at this time, and return to our office only as needed should any further problems or questions arise.  Endocarditis is a potentially serious infection of heart valves or inside lining of the heart.  It occurs more commonly in patients with diseased heart valves (such as patient's with aortic or mitral valve disease) and in patients who have undergone heart valve repair or replacement.  Certain surgical and dental procedures may put you at risk, such as dental cleaning, other dental procedures, or any surgery involving the respiratory, urinary, gastrointestinal tract, gallbladder or prostate gland.   To minimize your chances for develooping endocarditis, maintain good oral health and seek prompt medical attention for any infections involving the mouth, teeth, gums, skin or urinary tract.  Always notify your doctor or dentist about your underlying heart valve condition before having any invasive procedures. You will need to take antibiotics before certain procedures.

## 2013-01-15 ENCOUNTER — Ambulatory Visit: Payer: Medicare Other | Admitting: Thoracic Surgery (Cardiothoracic Vascular Surgery)

## 2013-01-15 ENCOUNTER — Encounter (HOSPITAL_COMMUNITY): Payer: Medicare Other

## 2013-01-17 ENCOUNTER — Encounter (HOSPITAL_COMMUNITY): Payer: Medicare Other

## 2013-01-19 ENCOUNTER — Encounter (HOSPITAL_COMMUNITY): Payer: Medicare Other

## 2013-01-22 ENCOUNTER — Encounter (HOSPITAL_COMMUNITY): Payer: Medicare Other

## 2013-02-12 ENCOUNTER — Telehealth: Payer: Self-pay | Admitting: *Deleted

## 2013-02-12 NOTE — Telephone Encounter (Signed)
I would suggest lorazepam 0.25mg  po q 6 hours prn anxiety.  She should not drive when taking this.  It may cause increased confusion so use should be limited.  #40

## 2013-02-12 NOTE — Telephone Encounter (Signed)
Patient daughter called and stated that her mom needs something called in for her nerves and help her rest. Her Husband passed away this weekend. Her friend was telling her about trying Alprazolam .25mg . Can we call something in for her to help get her through this difficult time? Please advise.

## 2013-02-12 NOTE — Telephone Encounter (Signed)
Patient son, Loraine Leriche, Notified and called in Rx into [facility]

## 2013-02-13 ENCOUNTER — Telehealth: Payer: Self-pay | Admitting: Cardiovascular Disease

## 2013-02-13 NOTE — Telephone Encounter (Signed)
Still has not received heart monitor  Please call

## 2013-02-13 NOTE — Telephone Encounter (Signed)
Pt. To come in today and have monitor placed

## 2013-02-14 ENCOUNTER — Ambulatory Visit: Payer: Medicare Other | Admitting: Internal Medicine

## 2013-02-14 DIAGNOSIS — I4891 Unspecified atrial fibrillation: Secondary | ICD-10-CM | POA: Diagnosis not present

## 2013-03-09 ENCOUNTER — Encounter: Payer: Self-pay | Admitting: Cardiovascular Disease

## 2013-03-12 ENCOUNTER — Telehealth: Payer: Self-pay | Admitting: Cardiovascular Disease

## 2013-03-12 NOTE — Telephone Encounter (Signed)
Wants Holter Monitor results please.

## 2013-03-12 NOTE — Telephone Encounter (Signed)
Returned call and spoke w/ pt.  Informed results have not been reviewed by MD/PA and nurse will call, mail letter or release in MyChart after they are reviewed.  Pt verbalized understanding and agreed w/ plan.  

## 2013-03-13 ENCOUNTER — Other Ambulatory Visit: Payer: Self-pay | Admitting: *Deleted

## 2013-03-13 ENCOUNTER — Other Ambulatory Visit: Payer: Self-pay | Admitting: Internal Medicine

## 2013-03-13 MED ORDER — LORAZEPAM 0.5 MG PO TABS: ORAL_TABLET | ORAL | Status: DC

## 2013-03-13 NOTE — Telephone Encounter (Signed)
Reprinted on wrong paper

## 2013-03-22 ENCOUNTER — Ambulatory Visit (INDEPENDENT_AMBULATORY_CARE_PROVIDER_SITE_OTHER): Payer: Medicare Other | Admitting: Physician Assistant

## 2013-03-22 ENCOUNTER — Encounter: Payer: Self-pay | Admitting: Physician Assistant

## 2013-03-22 VITALS — BP 140/80 | HR 68 | Ht 63.0 in | Wt 160.0 lb

## 2013-03-22 DIAGNOSIS — I4891 Unspecified atrial fibrillation: Secondary | ICD-10-CM

## 2013-03-22 DIAGNOSIS — I10 Essential (primary) hypertension: Secondary | ICD-10-CM | POA: Diagnosis not present

## 2013-03-22 DIAGNOSIS — I480 Paroxysmal atrial fibrillation: Secondary | ICD-10-CM

## 2013-03-22 NOTE — Progress Notes (Signed)
Date:  03/22/2013   ID:  [name], DOB Oct 10, 1931, MRN [id number]  PCP:  [name], MD  Primary Cardiologist:  Gwenlyn Found     History of Present Illness: [name] is a 80 y.o. overweight married Caucasian female accompanied by her husband, who is a patient of Dr. Kennon Holter. She is a mother of 2, grandmother to 1 grandchild. Her risk factors include hypertension and family history. A brother died at age 1 from heart-related issues while he was being operated on. She has never had a heart attack or stroke. 2-D echo was performed showed critical aortic stenosis the valve area 0.5 cm. Based on this the patient underwent right left heart cardiac catheterization by Dr. Gwenlyn Found revealing normal coronaries and normal left function. She had critical aortic stenosis and ultimately underwent bioprosthetic aortic valve replacement by Dr. Roxy Manns on 08/30/12 with an Edwards magna ease pericardial tissue valve (21 mm) excellent result. Her postop course was accompanied by nausea and paroxysmal atrial fibrillation for which she was started with low dose amiodarone and Coumadin anticoagulation. On her last visit with Dr. Gwenlyn Found plans were to see her back in 3 months and stop amiodarone and coumadin.   She last saw laura Dorene Ar and was prescribed a 30 day HR monitor which is her reason for being here today.  The results show NSR for the most part and no afib.  She did report some chest soreness when she lays on her left side and resolves when she repositions.  She also reports that her husband passed away in 02-26-2023.  The patient currently denies nausea, vomiting, fever, shortness of breath, orthopnea, dizziness, PND, cough, congestion, abdominal pain, hematochezia, melena, lower extremity edema, claudication.  Wt Readings from Last 3 Encounters:  03/22/13 160 lb (72.576 kg)  01/12/13 155 lb (70.308 kg)  01/02/13 155 lb (70.308 kg)     Past Medical History  Diagnosis Date  . Arthritis   .  Hyperlipidemia   . Heart murmur   . Hearing loss   . Change in voice   . Osteoporosis   . Cancer     Breast  . Aortic valve disorder 08/13/2011    ECHO - EF >93%; mild diastolic dysfunction; calcified aortic valve, not well visualized; mod/severe aortic stenosis w/ worsening gradients when compared to 2012  . PVD (peripheral vascular disease) 08/13/2010    R/P MV - normal pattern of perfusion in all regions, EF 76%; no significant wall abnormalities noted; normal perfusion study  . Carotid bruit 08/06/2008    Doppler - R ECA demonstrates noarrowing w/ elevated velocities consistent w/ >70% diameter reduction; R and L ICAs show no evidence of diameter reduction, significant tortuosity or vascular abnormality;   . Aortic stenosis 07/20/2012    Aortic stenosis   . Hypertension     dr Gwenlyn Found  . S/P aortic valve replacement with bioprosthetic valve 08/30/2012    21 mm [facility] Ease bovine pericardial tissue valve  . PAF (paroxysmal atrial fibrillation) 09/04/2012    Current Outpatient Prescriptions  Medication Sig Dispense Refill  . acetaminophen (TYLENOL) 325 MG tablet Take 2 tablets (650 mg total) by mouth every 6 (six) hours as needed.      . Biotin 5000 MCG CAPS Take 1 capsule by mouth daily.      . Calcium Citrate-Vitamin D (CITRACAL + D PO) Take 1 tablet by mouth 2 (two) times daily.       . fexofenadine (ALLEGRA) 180 MG tablet Take 180  mg by mouth daily as needed (for allergies).      . LORazepam (ATIVAN) 0.5 MG tablet Take 1 tablet by mouth every 6 hours as needed  40 tablet  0  . Magnesium Hydroxide (PHILLIPS MILK OF MAGNESIA PO) Take 15 mL by mouth. Take daily at 6 o'clock at night      . metoprolol tartrate (LOPRESSOR) 25 MG tablet Take 25 mg by mouth 2 (two) times daily.      . mometasone (NASONEX) 50 MCG/ACT nasal spray Place 2 sprays into the nose daily as needed.      Marland Kitchen omeprazole (PRILOSEC) 20 MG capsule TAKE 2 CAPSULES BY MOUTH DAILY FOR REFLUX  60 capsule  3  . shark liver  oil-cocoa butter (PREPARATION H) 0.25-3-85.5 % suppository Place 1 suppository rectally as needed.      . TOVIAZ 8 MG TB24 tablet TAKE 1 TABLET BY MOUTH EVERY DAY  30 tablet  1  . vitamin C (ASCORBIC ACID) 500 MG tablet Take 500 mg by mouth daily.      . vitamin E 400 UNIT capsule Take 400 Units by mouth daily.      . hydrOXYzine (ATARAX/VISTARIL) 25 MG tablet Take 25 mg by mouth every 6 (six) hours as needed for itching.        No current facility-administered medications for this visit.    Allergies:    Allergies  Allergen Reactions  . Codeine Rash    All over the body.    Social History:  The patient  reports that she has never smoked. She has never used smokeless tobacco. She reports that she does not drink alcohol or use illicit drugs.   Family history:   Family History  Problem Relation Age of Onset  . Cancer Mother     Bladder  . Early death Father     Tractor accident  . Early death Brother     Radiation protection practitioner  . Early death Brother     during heart surgery    ROS:  Please see the history of present illness.  All other systems reviewed and negative.   PHYSICAL EXAM: VS:  BP 140/80  Pulse 68  Ht 5\' 3"  (1.6 m)  Wt 160 lb (72.576 kg)  BMI 28.35 kg/m2 overweight, well developed, in no acute distress HEENT: Pupils are equal round react to light accommodation extraocular movements are intact.  Neck: no JVDNo cervical lymphadenopathy.  Right carotid bruit. Cardiac: Regular rate and rhythm without murmurs rubs or gallops. Lungs:  clear to auscultation bilaterally, no wheezing, rhonchi or rales Abd: soft, nontender, positive bowel sounds all quadrants, no hepatosplenomegaly Ext: 1+ lower extremity edema.  2+ radial and dorsalis pedis pulses. Skin: warm and dry Neuro:  Grossly normal       ASSESSMENT AND PLAN:  Problem List Items Addressed This Visit   HTN (hypertension) - Primary     BP mildly elevated.  No change in meds.    PAF (paroxysmal atrial fibrillation)      No Afib on cardionet monitor.   This is after Amiodarone stopped.

## 2013-03-22 NOTE — Assessment & Plan Note (Signed)
BP mildly elevated.  No change in meds.

## 2013-03-22 NOTE — Patient Instructions (Signed)
1.  Follow up with Dr. Berry in 6 months. 

## 2013-03-22 NOTE — Assessment & Plan Note (Signed)
No Afib on cardionet monitor.   This is after Amiodarone stopped.

## 2013-03-26 DIAGNOSIS — H26499 Other secondary cataract, unspecified eye: Secondary | ICD-10-CM | POA: Diagnosis not present

## 2013-03-27 ENCOUNTER — Other Ambulatory Visit (HOSPITAL_BASED_OUTPATIENT_CLINIC_OR_DEPARTMENT_OTHER): Payer: Self-pay | Admitting: Internal Medicine

## 2013-03-30 DIAGNOSIS — J385 Laryngeal spasm: Secondary | ICD-10-CM | POA: Diagnosis not present

## 2013-04-10 ENCOUNTER — Encounter: Payer: Self-pay | Admitting: Internal Medicine

## 2013-04-10 ENCOUNTER — Encounter: Payer: Self-pay | Admitting: *Deleted

## 2013-04-10 DIAGNOSIS — J383 Other diseases of vocal cords: Secondary | ICD-10-CM | POA: Insufficient documentation

## 2013-04-11 ENCOUNTER — Encounter: Payer: Self-pay | Admitting: Internal Medicine

## 2013-04-11 ENCOUNTER — Ambulatory Visit: Payer: Medicare Other | Admitting: Internal Medicine

## 2013-04-11 ENCOUNTER — Ambulatory Visit (INDEPENDENT_AMBULATORY_CARE_PROVIDER_SITE_OTHER): Payer: Medicare Other | Admitting: Internal Medicine

## 2013-04-11 VITALS — BP 130/60 | HR 72 | Temp 98.3°F | Resp 18 | Ht 63.0 in | Wt 159.6 lb

## 2013-04-11 DIAGNOSIS — Z952 Presence of prosthetic heart valve: Secondary | ICD-10-CM | POA: Diagnosis not present

## 2013-04-11 DIAGNOSIS — I4891 Unspecified atrial fibrillation: Secondary | ICD-10-CM | POA: Diagnosis not present

## 2013-04-11 DIAGNOSIS — I480 Paroxysmal atrial fibrillation: Secondary | ICD-10-CM

## 2013-04-11 DIAGNOSIS — Z953 Presence of xenogenic heart valve: Secondary | ICD-10-CM

## 2013-04-11 DIAGNOSIS — N3281 Overactive bladder: Secondary | ICD-10-CM

## 2013-04-11 DIAGNOSIS — J209 Acute bronchitis, unspecified: Secondary | ICD-10-CM

## 2013-04-11 DIAGNOSIS — I10 Essential (primary) hypertension: Secondary | ICD-10-CM

## 2013-04-11 DIAGNOSIS — K219 Gastro-esophageal reflux disease without esophagitis: Secondary | ICD-10-CM

## 2013-04-11 DIAGNOSIS — N318 Other neuromuscular dysfunction of bladder: Secondary | ICD-10-CM

## 2013-04-11 MED ORDER — AZITHROMYCIN 250 MG PO TABS: ORAL_TABLET | ORAL | Status: DC

## 2013-04-11 MED ORDER — FLUTICASONE PROPIONATE 50 MCG/ACT NA SUSP: 1.0000 | Freq: Every day | NASAL | Status: DC

## 2013-04-11 MED ORDER — DM-GUAIFENESIN ER 30-600 MG PO TB12: 1.0000 | ORAL_TABLET | Freq: Two times a day (BID) | ORAL | Status: DC

## 2013-04-11 NOTE — Progress Notes (Signed)
Patient ID: [name], female   DOB: [birth year], 80 y.o.   MRN: [id number]     Chief Complaint  Patient presents with  . Medical Managment of Chronic Issues  . Nasal Congestion   Allergies  Allergen Reactions  . Codeine Rash    All over the body.   HPI Patient seen today after discharge from nursing home several months back. She recently lost her husband. She follows with cardiology She has been having chest congestion x 2 weeks. She has runny nose and feels congested in her chest and sinuses. She is not able to bring her phlegm out. Some amount that she coughs out is yellow in color. Her nasal discharge has been yellowish green now. Denies any fever or chills She had AVR in 08/2012. i had seen her in [facility] [place] [facility] during her STR stay  Review of Systems  Constitutional: Negative for fever, chills, weight loss, malaise/fatigue and diaphoresis.  HENT: Negative for sore throat.  she has congestion. Has hearing loss and wears hearing aid. Feels scratchy in the throat Eyes: Negative for blurred vision, double vision and discharge. wears glasses Respiratory: Negative for wheezing.  has dyspnea with exertion Cardiovascular: Negative for chest pain, palpitations, orthopnea and leg swelling.  Gastrointestinal: Negative for heartburn, nausea, vomiting, abdominal pain, diarrhea and constipation.  Genitourinary: Negative for dysuria, urgency, frequency and flank pain.  Musculoskeletal: Negative for back pain, falls, joint pain and myalgias.  Skin: Negative for itching and rash.  Neurological: Negative for dizziness, tingling, focal weakness and headaches.  Psychiatric/Behavioral: Negative for depression and memory loss. The patient is not nervous/anxious.    Past Medical History  Diagnosis Date  . Arthritis   . Hyperlipidemia   . Heart murmur   . Hearing loss   . Change in voice   . Osteoporosis   . Cancer     Breast  . Aortic valve disorder 08/13/2011    ECHO - EF >78%; mild  diastolic dysfunction; calcified aortic valve, not well visualized; mod/severe aortic stenosis w/ worsening gradients when compared to 2012  . PVD (peripheral vascular disease) 08/13/2010    R/P MV - normal pattern of perfusion in all regions, EF 76%; no significant wall abnormalities noted; normal perfusion study  . Carotid bruit 08/06/2008    Doppler - R ECA demonstrates noarrowing w/ elevated velocities consistent w/ >70% diameter reduction; R and L ICAs show no evidence of diameter reduction, significant tortuosity or vascular abnormality;   . Aortic stenosis 07/20/2012    Aortic stenosis   . Hypertension     dr Gwenlyn Found  . S/P aortic valve replacement with bioprosthetic valve 08/30/2012    21 mm [facility] Ease bovine pericardial tissue valve  . PAF (paroxysmal atrial fibrillation) 09/04/2012   Past Surgical History  Procedure Laterality Date  . Breast lumpectomy  02/25/1997    right  . Mastectomy  09/29/95    left  . Total hip arthroplasty  09/2010  . Colon surgery  1959  . Abdominal hysterectomy      Partial  . Eye surgery  1997    Cataract surgery  . Biopsy shoulder Left 10/08/2005    shave biopsy  . Cosmetic surgery Left 1997    Breast implant  . Left heart cath  08/14/12    Nl cors, AS  . Cardiac catheterization    . Aortic valve replacement N/A 08/30/2012    Procedure: AORTIC VALVE REPLACEMENT (AVR);  Surgeon: Rexene Alberts, MD;  Location: [place];  Service: Open Heart Surgery;  Laterality: N/A;  . Intraoperative transesophageal echocardiogram N/A 08/30/2012    Procedure: INTRAOPERATIVE TRANSESOPHAGEAL ECHOCARDIOGRAM;  Surgeon: Rexene Alberts, MD;  Location: [place];  Service: Open Heart Surgery;  Laterality: N/A;  . Partial hysterectomy     Current Outpatient Prescriptions on File Prior to Visit  Medication Sig Dispense Refill  . acetaminophen (TYLENOL) 325 MG tablet Take 2 tablets (650 mg total) by mouth every 6 (six) hours as needed.      . Biotin 5000 MCG CAPS Take 1 capsule by  mouth daily.      . Calcium Citrate-Vitamin D (CITRACAL + D PO) Take 1 tablet by mouth 2 (two) times daily.       . fexofenadine (ALLEGRA) 180 MG tablet Take 180 mg by mouth daily as needed (for allergies).      . LORazepam (ATIVAN) 0.5 MG tablet Take 1 tablet by mouth every 6 hours as needed  40 tablet  0  . Magnesium Hydroxide (PHILLIPS MILK OF MAGNESIA PO) Take 15 mL by mouth. Take daily at 6 o'clock at night      . metoprolol tartrate (LOPRESSOR) 25 MG tablet Take 25 mg by mouth 2 (two) times daily.      Marland Kitchen omeprazole (PRILOSEC) 20 MG capsule TAKE 2 CAPSULES BY MOUTH DAILY FOR REFLUX  60 capsule  3  . shark liver oil-cocoa butter (PREPARATION H) 0.25-3-85.5 % suppository Place 1 suppository rectally as needed.      . TOVIAZ 8 MG TB24 tablet TAKE 1 TABLET BY MOUTH EVERY DAY  30 tablet  1  . vitamin C (ASCORBIC ACID) 500 MG tablet Take 500 mg by mouth daily.      . vitamin E 400 UNIT capsule Take 400 Units by mouth daily.      . hydrOXYzine (ATARAX/VISTARIL) 25 MG tablet Take 25 mg by mouth every 6 (six) hours as needed for itching.       . mometasone (NASONEX) 50 MCG/ACT nasal spray Place 2 sprays into the nose daily as needed.       No current facility-administered medications on file prior to visit.    Physical exam BP 130/60  Pulse 72  Temp(Src) 98.3 F (36.8 C) (Oral)  Resp 18  Ht 5\' 3"  (1.6 m)  Wt 159 lb 9.6 oz (72.394 kg)  BMI 28.28 kg/m2  SpO2 93%  General- elderly female in no acute distress Head- atraumatic, normocephalic, no sinus tenderness Eyes- PERRLA, EOMI, no pallor, no icterus, no discharge Neck- no lymphadenopathy, no thyromegaly, no jugular vein distension, no carotid bruit, has oropharyngeal erythema Ears- left ear normal tympanic membrane and normal external ear canal , right ear normal tympanic membrane and normal external ear canal Chest- no chest wall deformities, no chest wall tenderness Cardiovascular- normal s1,s2, no murmurs/ rubs/ gallops Respiratory-  bilateral clear to auscultation, occassional rhonchi present which clears with cough Abdomen- bowel sounds present, soft, non tender Musculoskeletal- able to move all 4 extremities Psychiatry- alert and oriented to person, place and time, normal mood and affect  No recent labs  Assessment/plan 1. Acute bronchitis Will have her on z pack for 5 days with mucinex. Reassess if has fever or worsening of symptom. Will provide flonase to help with nasal congestion - CBC with Differential - CMP  2. S/P aortic valve replacement with bioprosthetic valve Tolerated procedure well. Follows with cardiology. Continue lopressor. Off anticoagulation  3. HTN (hypertension) Stable bp reading  4. PAF (paroxysmal atrial fibrillation) Resolved. On  lopressor for rate control. Off all anticoagulation. Follows with cardiology  5. GERD (gastroesophageal reflux disease) Stable at present off all medications  6. Overactive bladder Continue Lisbeth Ply for now

## 2013-04-12 ENCOUNTER — Encounter: Payer: Self-pay | Admitting: *Deleted

## 2013-04-12 LAB — CBC WITH DIFFERENTIAL/PLATELET
Basophils Absolute: 0 10*3/uL (ref 0.0–0.2)
Basos: 1 %
Eos: 5 %
Eosinophils Absolute: 0.3 10*3/uL (ref 0.0–0.4)
HCT: 34.7 % (ref 34.0–46.6)
Hemoglobin: 11.5 g/dL (ref 11.1–15.9)
Immature Grans (Abs): 0 10*3/uL (ref 0.0–0.1)
Immature Granulocytes: 0 %
Lymphocytes Absolute: 1.8 10*3/uL (ref 0.7–3.1)
Lymphs: 27 %
MCH: 31 pg (ref 26.6–33.0)
MCHC: 33.1 g/dL (ref 31.5–35.7)
MCV: 94 fL (ref 79–97)
Monocytes Absolute: 0.6 10*3/uL (ref 0.1–0.9)
Monocytes: 9 %
Neutrophils Absolute: 4 10*3/uL (ref 1.4–7.0)
Neutrophils Relative %: 58 %
RBC: 3.71 x10E6/uL — ABNORMAL LOW (ref 3.77–5.28)
RDW: 14 % (ref 12.3–15.4)
WBC: 6.8 10*3/uL (ref 3.4–10.8)

## 2013-04-12 LAB — COMPREHENSIVE METABOLIC PANEL
ALT: 16 IU/L (ref 0–32)
AST: 18 IU/L (ref 0–40)
Albumin/Globulin Ratio: 1.8 (ref 1.1–2.5)
Albumin: 3.8 g/dL (ref 3.5–4.8)
Alkaline Phosphatase: 64 IU/L (ref 39–117)
BUN/Creatinine Ratio: 17 (ref 11–26)
BUN: 17 mg/dL (ref 8–27)
CO2: 22 mmol/L (ref 18–29)
Calcium: 9.1 mg/dL (ref 8.7–10.3)
Chloride: 102 mmol/L (ref 97–108)
Creatinine, Ser: 1.01 mg/dL — ABNORMAL HIGH (ref 0.57–1.00)
GFR calc Af Amer: 61 mL/min/{1.73_m2} (ref >59)
GFR calc non Af Amer: 53 mL/min/{1.73_m2} — ABNORMAL LOW (ref >59)
Globulin, Total: 2.1 g/dL (ref 1.5–4.5)
Glucose: 91 mg/dL (ref 65–99)
Potassium: 4.2 mmol/L (ref 3.5–5.2)
Sodium: 140 mmol/L (ref 134–144)
Total Bilirubin: 0.2 mg/dL (ref 0.0–1.2)
Total Protein: 5.9 g/dL — ABNORMAL LOW (ref 6.0–8.5)

## 2013-04-13 ENCOUNTER — Telehealth: Payer: Self-pay | Admitting: *Deleted

## 2013-04-13 NOTE — Telephone Encounter (Signed)
Pt sent mychart msg as well and I responded back. Pt is coming in this afternoon to see Dr. Linna Darner...[name]

## 2013-04-13 NOTE — Telephone Encounter (Signed)
Patient phoned, stating she was diagnosed Monday with shingles and at that time they were only on 1 side of her body.  She states they're bilateral now.  Does she need to come in for a f/u or continue previously recommended plan of care?   CB#[phone]

## 2013-05-08 ENCOUNTER — Ambulatory Visit: Payer: Medicare Other | Admitting: Internal Medicine

## 2013-05-16 ENCOUNTER — Ambulatory Visit
Admission: RE | Admit: 2013-05-16 | Discharge: 2013-05-16 | Disposition: A | Discharge status: Home / Self Care | Payer: Medicare Other | Source: Ambulatory Visit | Attending: Internal Medicine | Admitting: Internal Medicine

## 2013-05-16 ENCOUNTER — Encounter: Payer: Self-pay | Admitting: Internal Medicine

## 2013-05-16 ENCOUNTER — Ambulatory Visit (INDEPENDENT_AMBULATORY_CARE_PROVIDER_SITE_OTHER): Payer: Medicare Other | Admitting: Internal Medicine

## 2013-05-16 VITALS — BP 140/72 | HR 61 | Temp 97.0°F | Wt 161.4 lb

## 2013-05-16 DIAGNOSIS — Z952 Presence of prosthetic heart valve: Secondary | ICD-10-CM | POA: Diagnosis not present

## 2013-05-16 DIAGNOSIS — J441 Chronic obstructive pulmonary disease with (acute) exacerbation: Secondary | ICD-10-CM

## 2013-05-16 DIAGNOSIS — I10 Essential (primary) hypertension: Secondary | ICD-10-CM | POA: Diagnosis not present

## 2013-05-16 DIAGNOSIS — R059 Cough, unspecified: Secondary | ICD-10-CM | POA: Diagnosis not present

## 2013-05-16 DIAGNOSIS — R05 Cough: Secondary | ICD-10-CM | POA: Diagnosis not present

## 2013-05-16 DIAGNOSIS — K219 Gastro-esophageal reflux disease without esophagitis: Secondary | ICD-10-CM

## 2013-05-16 DIAGNOSIS — R0602 Shortness of breath: Secondary | ICD-10-CM | POA: Diagnosis not present

## 2013-05-16 DIAGNOSIS — N3281 Overactive bladder: Secondary | ICD-10-CM

## 2013-05-16 DIAGNOSIS — Z953 Presence of xenogenic heart valve: Secondary | ICD-10-CM

## 2013-05-16 DIAGNOSIS — M810 Age-related osteoporosis without current pathological fracture: Secondary | ICD-10-CM

## 2013-05-16 DIAGNOSIS — N318 Other neuromuscular dysfunction of bladder: Secondary | ICD-10-CM

## 2013-05-16 MED ORDER — PREDNISONE 50 MG PO TABS: ORAL_TABLET | ORAL | Status: DC

## 2013-05-16 MED ORDER — ALBUTEROL SULFATE HFA 108 (90 BASE) MCG/ACT IN AERS: 2.0000 | INHALATION_SPRAY | Freq: Four times a day (QID) | RESPIRATORY_TRACT | Status: DC | PRN

## 2013-05-16 MED ORDER — MOXIFLOXACIN HCL 400 MG PO TABS: 400.0000 mg | ORAL_TABLET | Freq: Every day | ORAL | Status: DC

## 2013-05-16 NOTE — Progress Notes (Signed)
Patient ID: [name], female   DOB: 31-Jul-1931, 80 y.o.   MRN: [id number]    Chief Complaint  Patient presents with  . Follow-up    1 month f/u with no recent labs  . other    still having chest congestion with a cough (yellowish-green sputum)    Allergies  Allergen Reactions  . Codeine Rash    All over the body.    HPI 80 y/o female pt is here for follow up. She continues to feel stuffed in her upper chest and throat. she continues to cough up yellow phlegm with greenish discoloartion at times. Denies hemoptysis Denies any fever or chills. Has some runny nose. Denies earaches. Has history of copd  Review of Systems  Constitutional: Negative for fever, chills, weight loss, malaise/fatigue and diaphoresis.  HENT: Negative for sore throat.  she has congestion. Has hearing loss and wears hearing aid. Feels scratchy in the throat Eyes: Negative for blurred vision, double vision and discharge. wears glasses Respiratory: Negative for wheezing. has dyspnea with her exertion Cardiovascular: Negative for chest pain, palpitations, orthopnea and leg swelling. Has some swelling of her feet. Hx of AVR Gastrointestinal: Negative for nausea, vomiting, abdominal pain, diarrhea and constipation.  Genitourinary: Negative for dysuria, urgency, frequency and flank pain.  Musculoskeletal: Negative for back pain, falls, joint pain and myalgias.  Skin: Negative for itching and rash.  Neurological: Negative for dizziness, tingling, focal weakness and headaches.  Psychiatric/Behavioral: Negative for depression and memory loss. The patient is not nervous/anxious.    Past Medical History  Diagnosis Date  . Arthritis   . Hyperlipidemia   . Heart murmur   . Hearing loss   . Change in voice   . Osteoporosis   . Cancer     Breast  . Aortic valve disorder 08/13/2011    ECHO - EF 123456; mild diastolic dysfunction; calcified aortic valve, not well visualized; mod/severe aortic stenosis w/ worsening  gradients when compared to 2012  . PVD (peripheral vascular disease) 08/13/2010    R/P MV - normal pattern of perfusion in all regions, EF 76%; no significant wall abnormalities noted; normal perfusion study  . Carotid bruit 08/06/2008    Doppler - R ECA demonstrates noarrowing w/ elevated velocities consistent w/ >70% diameter reduction; R and L ICAs show no evidence of diameter reduction, significant tortuosity or vascular abnormality;   . Aortic stenosis 07/20/2012    Aortic stenosis   . Hypertension     dr Gwenlyn Found  . S/P aortic valve replacement with bioprosthetic valve 08/30/2012    21 mm [facility] Ease bovine pericardial tissue valve  . PAF (paroxysmal atrial fibrillation) 09/04/2012   Past Surgical History  Procedure Laterality Date  . Breast lumpectomy  02/25/1997    right  . Mastectomy  09/29/95    left  . Total hip arthroplasty  09/2010  . Colon surgery  1959  . Abdominal hysterectomy      Partial  . Eye surgery  1997    Cataract surgery  . Biopsy shoulder Left 10/08/2005    shave biopsy  . Cosmetic surgery Left 1997    Breast implant  . Left heart cath  08/14/12    Nl cors, AS  . Cardiac catheterization    . Aortic valve replacement N/A 08/30/2012    Procedure: AORTIC VALVE REPLACEMENT (AVR);  Surgeon: Rexene Alberts, MD;  Location: [place];  Service: Open Heart Surgery;  Laterality: N/A;  . Intraoperative transesophageal echocardiogram N/A 08/30/2012  Procedure: INTRAOPERATIVE TRANSESOPHAGEAL ECHOCARDIOGRAM;  Surgeon: Rexene Alberts, MD;  Location: [place];  Service: Open Heart Surgery;  Laterality: N/A;  . Partial hysterectomy     Current Outpatient Prescriptions on File Prior to Visit  Medication Sig Dispense Refill  . acetaminophen (TYLENOL) 325 MG tablet Take 2 tablets (650 mg total) by mouth every 6 (six) hours as needed.      . Biotin 5000 MCG CAPS Take 1 capsule by mouth daily.      . Calcium Citrate-Vitamin D (CITRACAL + D PO) Take 1 tablet by mouth 2 (two) times  daily.       Marland Kitchen dextromethorphan-guaiFENesin (MUCINEX DM) 30-600 MG per 12 hr tablet Take 1 tablet by mouth 2 (two) times daily.  10 tablet  0  . fluticasone (FLONASE) 50 MCG/ACT nasal spray Place 1 spray into both nostrils daily.  16 g  2  . LORazepam (ATIVAN) 0.5 MG tablet Take 1 tablet by mouth every 6 hours as needed  40 tablet  0  . Magnesium Hydroxide (PHILLIPS MILK OF MAGNESIA PO) Take 15 mL by mouth. Take daily at 6 o'clock at night      . metoprolol tartrate (LOPRESSOR) 25 MG tablet Take 25 mg by mouth 2 (two) times daily.      Marland Kitchen omeprazole (PRILOSEC) 20 MG capsule TAKE 2 CAPSULES BY MOUTH DAILY FOR REFLUX  60 capsule  3  . shark liver oil-cocoa butter (PREPARATION H) 0.25-3-85.5 % suppository Place 1 suppository rectally as needed.      . TOVIAZ 8 MG TB24 tablet TAKE 1 TABLET BY MOUTH EVERY DAY  30 tablet  1  . TRIAMCINOLONE ACETONIDE,NASAL, NA Place 1 spray into the nose as needed (16.5 g bottle).      . vitamin C (ASCORBIC ACID) 500 MG tablet Take 500 mg by mouth daily.      . vitamin E 400 UNIT capsule Take 400 Units by mouth daily.       No current facility-administered medications on file prior to visit.    Physical exam BP 140/72  Pulse 61  Temp(Src) 97 F (36.1 C) (Oral)  Wt 161 lb 6.4 oz (73.211 kg)  SpO2 98%  General- elderly female in no acute distress Head- atraumatic, normocephalic, no sinus tenderness Eyes- PERRLA, EOMI, no pallor, no icterus, no discharge Neck- no lymphadenopathy, no thyromegaly Ears- left ear normal tympanic membrane and normal external ear canal , right ear normal tympanic membrane and normal external ear canal Chest- no chest wall deformities, no chest wall tenderness Cardiovascular- normal s1,s2, no murmurs/ rubs/ gallops Respiratory- bilateral decreased air entry, has scattered wheezing, no crackles Abdomen- bowel sounds present, soft, non tender Musculoskeletal- able to move all 4 extremities Psychiatry- alert and oriented to person,  place and time, normal mood and affect  Assessment/plan  1. HTN (hypertension) Stable. Continue metoprolol  2. S/P aortic valve replacement with bioprosthetic valve Stable. Continue metoprolol  3. GERD (gastroesophageal reflux disease) Stable. Continue prilosec  4. COPD exacerbation Recent acute bronchitis and worsening of symptom with increased cough and sputum production raises concern for copd exacerbation. Will start her on avelox 400 mg daily for a week with prednsione 50 mg daily for 5 days. She is not on any bronchodilator. Will start her on albuterol MDI q6h prn. After the acute phase, consider LABA+ steroid inhaler for maintenance - DG Chest 2 View; Future  5. Overactive bladder Tolerating toviaz well, continue this  6. Osteoporosis, unspecified - DG Bone Density; Future -  continue ca-vit d

## 2013-05-19 ENCOUNTER — Other Ambulatory Visit (HOSPITAL_BASED_OUTPATIENT_CLINIC_OR_DEPARTMENT_OTHER): Payer: Self-pay | Admitting: Geriatric Medicine

## 2013-05-21 ENCOUNTER — Ambulatory Visit (HOSPITAL_COMMUNITY)
Admission: RE | Admit: 2013-05-21 | Discharge: 2013-05-21 | Disposition: A | Discharge status: Home / Self Care | Payer: Medicare Other | Source: Ambulatory Visit | Attending: Internal Medicine | Admitting: Internal Medicine

## 2013-05-21 DIAGNOSIS — Z13820 Encounter for screening for osteoporosis: Secondary | ICD-10-CM | POA: Diagnosis not present

## 2013-05-21 DIAGNOSIS — M899 Disorder of bone, unspecified: Secondary | ICD-10-CM | POA: Diagnosis not present

## 2013-05-21 DIAGNOSIS — M810 Age-related osteoporosis without current pathological fracture: Secondary | ICD-10-CM | POA: Diagnosis not present

## 2013-05-21 DIAGNOSIS — Z780 Asymptomatic menopausal state: Secondary | ICD-10-CM | POA: Insufficient documentation

## 2013-05-21 DIAGNOSIS — M949 Disorder of cartilage, unspecified: Secondary | ICD-10-CM | POA: Diagnosis not present

## 2013-05-30 ENCOUNTER — Encounter: Payer: Self-pay | Admitting: Internal Medicine

## 2013-05-30 ENCOUNTER — Ambulatory Visit (INDEPENDENT_AMBULATORY_CARE_PROVIDER_SITE_OTHER): Payer: Medicare Other | Admitting: Internal Medicine

## 2013-05-30 VITALS — BP 124/68 | HR 64 | Temp 97.5°F | Wt 159.6 lb

## 2013-05-30 DIAGNOSIS — J449 Chronic obstructive pulmonary disease, unspecified: Secondary | ICD-10-CM | POA: Diagnosis not present

## 2013-05-30 DIAGNOSIS — M810 Age-related osteoporosis without current pathological fracture: Secondary | ICD-10-CM

## 2013-05-30 MED ORDER — SALINE SPRAY 0.65 % NA SOLN: 1.0000 | NASAL | Status: DC | PRN

## 2013-05-30 MED ORDER — BUDESONIDE-FORMOTEROL FUMARATE 160-4.5 MCG/ACT IN AERO: 2.0000 | INHALATION_SPRAY | Freq: Two times a day (BID) | RESPIRATORY_TRACT | Status: DC

## 2013-05-30 NOTE — Patient Instructions (Signed)
Stop using flonase

## 2013-05-30 NOTE — Progress Notes (Signed)
Patient ID: [name], female   DOB: Mar 27, 1931, 80 y.o.   MRN: [id number]    Chief Complaint  Patient presents with  . Follow-up    2 week f/u on copd, osteoporosis   HPI  80 y/o female patient is seen today for follow up on her copd exacerbation. Her breathing has improved. She still has some cough. She mentions her cough had improved. She then had her hair done and was under the hair dryer for some time after which she feels dryness in her nostrils.  She also has worsening osteoporosis on dexa scan and has been on reclast in the past. She cannot tolerate po medications  ROS No fever or chills No falls reported No chest pain or dyspnea No nausea, vomiting, abdominal pain No headache or blurry vision  Past Medical History  Diagnosis Date  . Arthritis   . Hyperlipidemia   . Heart murmur   . Hearing loss   . Change in voice   . Osteoporosis   . Cancer     Breast  . Aortic valve disorder 08/13/2011    ECHO - EF >03%; mild diastolic dysfunction; calcified aortic valve, not well visualized; mod/severe aortic stenosis w/ worsening gradients when compared to 2012  . PVD (peripheral vascular disease) 08/13/2010    R/P MV - normal pattern of perfusion in all regions, EF 76%; no significant wall abnormalities noted; normal perfusion study  . Carotid bruit 08/06/2008    Doppler - R ECA demonstrates noarrowing w/ elevated velocities consistent w/ >70% diameter reduction; R and L ICAs show no evidence of diameter reduction, significant tortuosity or vascular abnormality;   . Aortic stenosis 07/20/2012    Aortic stenosis   . Hypertension     dr Gwenlyn Found  . S/P aortic valve replacement with bioprosthetic valve 08/30/2012    21 mm [facility] Ease bovine pericardial tissue valve  . PAF (paroxysmal atrial fibrillation) 09/04/2012   Past Surgical History  Procedure Laterality Date  . Breast lumpectomy  02/25/1997    right  . Mastectomy  09/29/95    left  . Total hip arthroplasty  09/2010  .  Colon surgery  1959  . Abdominal hysterectomy      Partial  . Eye surgery  1997    Cataract surgery  . Biopsy shoulder Left 10/08/2005    shave biopsy  . Cosmetic surgery Left 1997    Breast implant  . Left heart cath  08/14/12    Nl cors, AS  . Cardiac catheterization    . Aortic valve replacement N/A 08/30/2012    Procedure: AORTIC VALVE REPLACEMENT (AVR);  Surgeon: Rexene Alberts, MD;  Location: [place];  Service: Open Heart Surgery;  Laterality: N/A;  . Intraoperative transesophageal echocardiogram N/A 08/30/2012    Procedure: INTRAOPERATIVE TRANSESOPHAGEAL ECHOCARDIOGRAM;  Surgeon: Rexene Alberts, MD;  Location: [place];  Service: Open Heart Surgery;  Laterality: N/A;  . Partial hysterectomy     Current Outpatient Prescriptions on File Prior to Visit  Medication Sig Dispense Refill  . acetaminophen (TYLENOL) 325 MG tablet Take 2 tablets (650 mg total) by mouth every 6 (six) hours as needed.      Marland Kitchen albuterol (PROVENTIL HFA;VENTOLIN HFA) 108 (90 BASE) MCG/ACT inhaler Inhale 2 puffs into the lungs every 6 (six) hours as needed for wheezing or shortness of breath.  1 Inhaler  2  . Biotin 5000 MCG CAPS Take 1 capsule by mouth daily.      . Calcium Citrate-Vitamin  D (CITRACAL + D PO) Take 1 tablet by mouth 2 (two) times daily.       Marland Kitchen dextromethorphan-guaiFENesin (MUCINEX DM) 30-600 MG per 12 hr tablet Take 1 tablet by mouth 2 (two) times daily.  10 tablet  0  . LORazepam (ATIVAN) 0.5 MG tablet Take 1 tablet by mouth every 6 hours as needed  40 tablet  0  . Magnesium Hydroxide (PHILLIPS MILK OF MAGNESIA PO) Take 15 mL by mouth. Take daily at 6 o'clock at night      . metoprolol tartrate (LOPRESSOR) 25 MG tablet Take 25 mg by mouth 2 (two) times daily.      Marland Kitchen omeprazole (PRILOSEC) 20 MG capsule TAKE 2 CAPSULES BY MOUTH DAILY FOR REFLUX  60 capsule  5  . shark liver oil-cocoa butter (PREPARATION H) 0.25-3-85.5 % suppository Place 1 suppository rectally as needed.      . TOVIAZ 8 MG TB24 tablet  TAKE 1 TABLET BY MOUTH EVERY DAY  30 tablet  1  . vitamin C (ASCORBIC ACID) 500 MG tablet Take 500 mg by mouth daily.      . vitamin E 400 UNIT capsule Take 400 Units by mouth daily.       No current facility-administered medications on file prior to visit.    Physical exam BP 124/68  Pulse 64  Temp(Src) 97.5 F (36.4 C) (Oral)  Wt 159 lb 9.6 oz (72.394 kg)  General- elderly female in no acute distress Head- atraumatic, normocephalic, no sinus tenderness Eyes- PERRLA, EOMI, no pallor, no icterus, no discharge Neck- no lymphadenopathy, no thyromegaly Ears- left ear normal tympanic membrane and normal external ear canal , right ear normal tympanic membrane and normal external ear canal Chest- no chest wall deformities, no chest wall tenderness Cardiovascular- normal s1,s2, no murmurs/ rubs/ gallops Respiratory- bilateral poor air entry with wheezing Abdomen- bowel sounds present, soft, non tender Musculoskeletal- able to move all 4 extremities Psychiatry- alert and oriented to person, place and time, normal mood and affect  Assessment/plan  1. Osteoporosis, unspecified Continue ca-vit d. Check bmp to assess creatinine clearance for reclast. Once reviewed, will need to order reclast - Basic Metabolic Panel  2. COPD (chronic obstructive pulmonary disease)- noted on CXR 08/08/12 Will start her on symbicort 2 puff bid, explained to rinse her mouth after use. Continue prn albuterol for dyspnea/ wheezing. Reassess if no improvement

## 2013-05-31 LAB — BASIC METABOLIC PANEL
BUN/Creatinine Ratio: 14 (ref 11–26)
BUN: 15 mg/dL (ref 8–27)
CO2: 25 mmol/L (ref 18–29)
Calcium: 9.3 mg/dL (ref 8.7–10.3)
Chloride: 97 mmol/L (ref 97–108)
Creatinine, Ser: 1.09 mg/dL — ABNORMAL HIGH (ref 0.57–1.00)
GFR calc Af Amer: 55 mL/min/{1.73_m2} — ABNORMAL LOW (ref >59)
GFR calc non Af Amer: 48 mL/min/{1.73_m2} — ABNORMAL LOW (ref >59)
Glucose: 82 mg/dL (ref 65–99)
Potassium: 4.4 mmol/L (ref 3.5–5.2)
Sodium: 139 mmol/L (ref 134–144)

## 2013-06-06 ENCOUNTER — Other Ambulatory Visit: Payer: Self-pay | Admitting: Internal Medicine

## 2013-06-06 DIAGNOSIS — M810 Age-related osteoporosis without current pathological fracture: Secondary | ICD-10-CM

## 2013-06-06 MED ORDER — ZOLEDRONIC ACID 5 MG/100ML IV SOLN: 5.0000 mg | Freq: Once | INTRAVENOUS | Status: DC

## 2013-06-14 ENCOUNTER — Telehealth: Payer: Self-pay | Admitting: *Deleted

## 2013-06-14 NOTE — Telephone Encounter (Signed)
Called [name] at [facility] and Patient has an appointment for Reclast on 06/21/2013 @ 10:00am Patient Notified.

## 2013-06-19 ENCOUNTER — Other Ambulatory Visit (HOSPITAL_BASED_OUTPATIENT_CLINIC_OR_DEPARTMENT_OTHER): Payer: Self-pay | Admitting: Internal Medicine

## 2013-06-19 ENCOUNTER — Telehealth: Payer: Self-pay | Admitting: *Deleted

## 2013-06-19 NOTE — Telephone Encounter (Signed)
Called [facility]  # [phone] and spoke with Minus Breeding and received prior auth. For patient's Reclast Injection that is scheduled for 06/21/13.   Patient ID#: [id number] Case: [id number] Approved till 06/20/2014  Alden Server at [place] Stay # [phone] and informed them of the approval

## 2013-06-20 ENCOUNTER — Other Ambulatory Visit (HOSPITAL_COMMUNITY): Payer: Self-pay | Admitting: *Deleted

## 2013-06-20 ENCOUNTER — Other Ambulatory Visit (HOSPITAL_BASED_OUTPATIENT_CLINIC_OR_DEPARTMENT_OTHER): Payer: Self-pay | Admitting: Internal Medicine

## 2013-06-21 ENCOUNTER — Ambulatory Visit (HOSPITAL_COMMUNITY)
Admission: RE | Admit: 2013-06-21 | Discharge: 2013-06-21 | Disposition: A | Discharge status: Home / Self Care | Payer: Medicare Other | Source: Ambulatory Visit | Attending: Internal Medicine | Admitting: Internal Medicine

## 2013-06-21 DIAGNOSIS — Z5181 Encounter for therapeutic drug level monitoring: Secondary | ICD-10-CM | POA: Diagnosis not present

## 2013-06-21 DIAGNOSIS — M810 Age-related osteoporosis without current pathological fracture: Secondary | ICD-10-CM | POA: Diagnosis not present

## 2013-06-21 MED ORDER — ZOLEDRONIC ACID 5 MG/100ML IV SOLN
5.0000 mg | Freq: Once | INTRAVENOUS | Status: AC
  Administered 2013-06-21: 5 mg via INTRAVENOUS

## 2013-06-21 MED ORDER — ZOLEDRONIC ACID 5 MG/100ML IV SOLN
INTRAVENOUS | Status: AC
  Filled 2013-06-21: qty 100

## 2013-07-04 ENCOUNTER — Encounter: Payer: Self-pay | Admitting: Cardiovascular Disease

## 2013-07-04 ENCOUNTER — Ambulatory Visit (INDEPENDENT_AMBULATORY_CARE_PROVIDER_SITE_OTHER): Payer: Medicare Other | Admitting: Cardiovascular Disease

## 2013-07-04 VITALS — BP 148/72 | HR 61 | Ht 63.0 in | Wt 165.0 lb

## 2013-07-04 DIAGNOSIS — I10 Essential (primary) hypertension: Secondary | ICD-10-CM | POA: Diagnosis not present

## 2013-07-04 DIAGNOSIS — I4891 Unspecified atrial fibrillation: Secondary | ICD-10-CM | POA: Diagnosis not present

## 2013-07-04 DIAGNOSIS — I359 Nonrheumatic aortic valve disorder, unspecified: Secondary | ICD-10-CM

## 2013-07-04 DIAGNOSIS — I350 Nonrheumatic aortic (valve) stenosis: Secondary | ICD-10-CM

## 2013-07-04 DIAGNOSIS — I480 Paroxysmal atrial fibrillation: Secondary | ICD-10-CM

## 2013-07-04 IMAGING — CR DG CHEST 2V
2 series · 2 of 2 positions shown · non-contrast
Comparison: 08/08/2012 and 09/07/2010.

CLINICAL DATA: Preoperative respiratory examination for aortic
valve surgery.  Recent cough.

CHEST - 2 VIEW

[w chest pa]
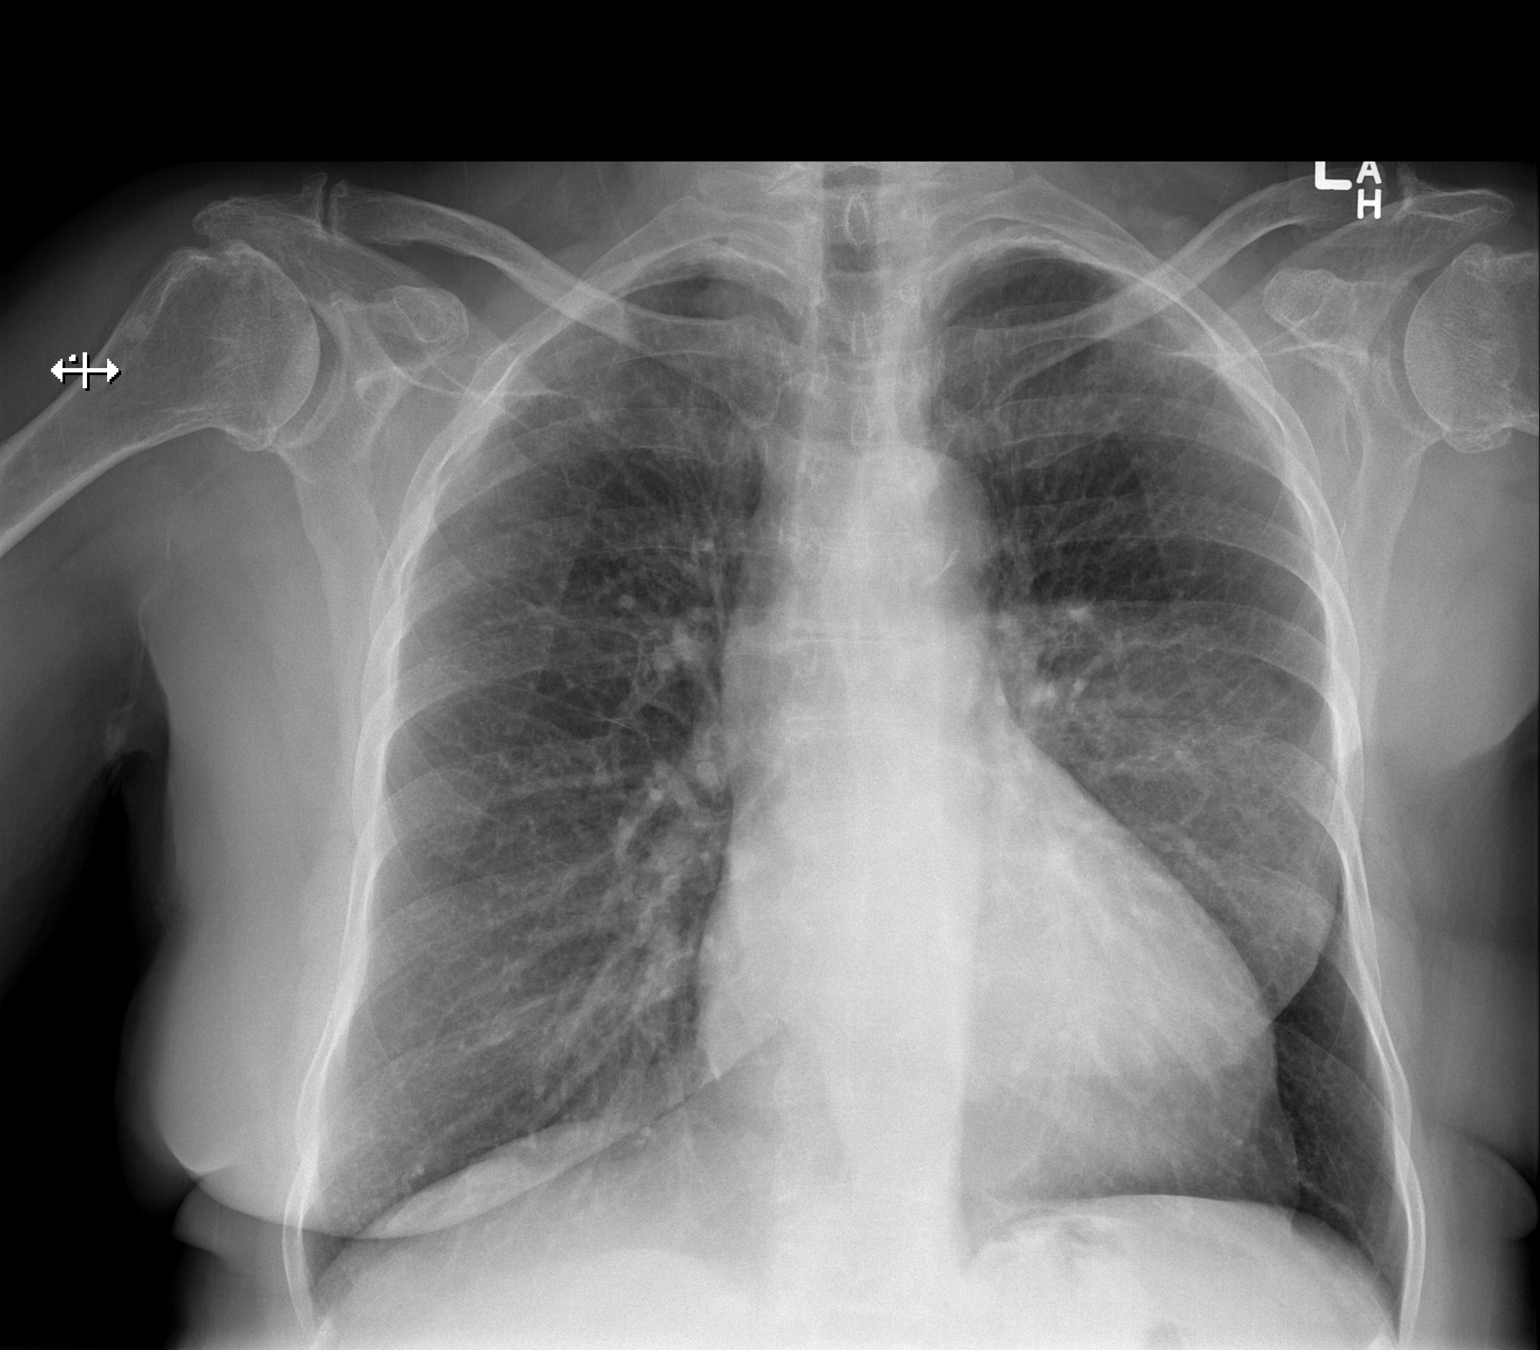

[w chest lat]
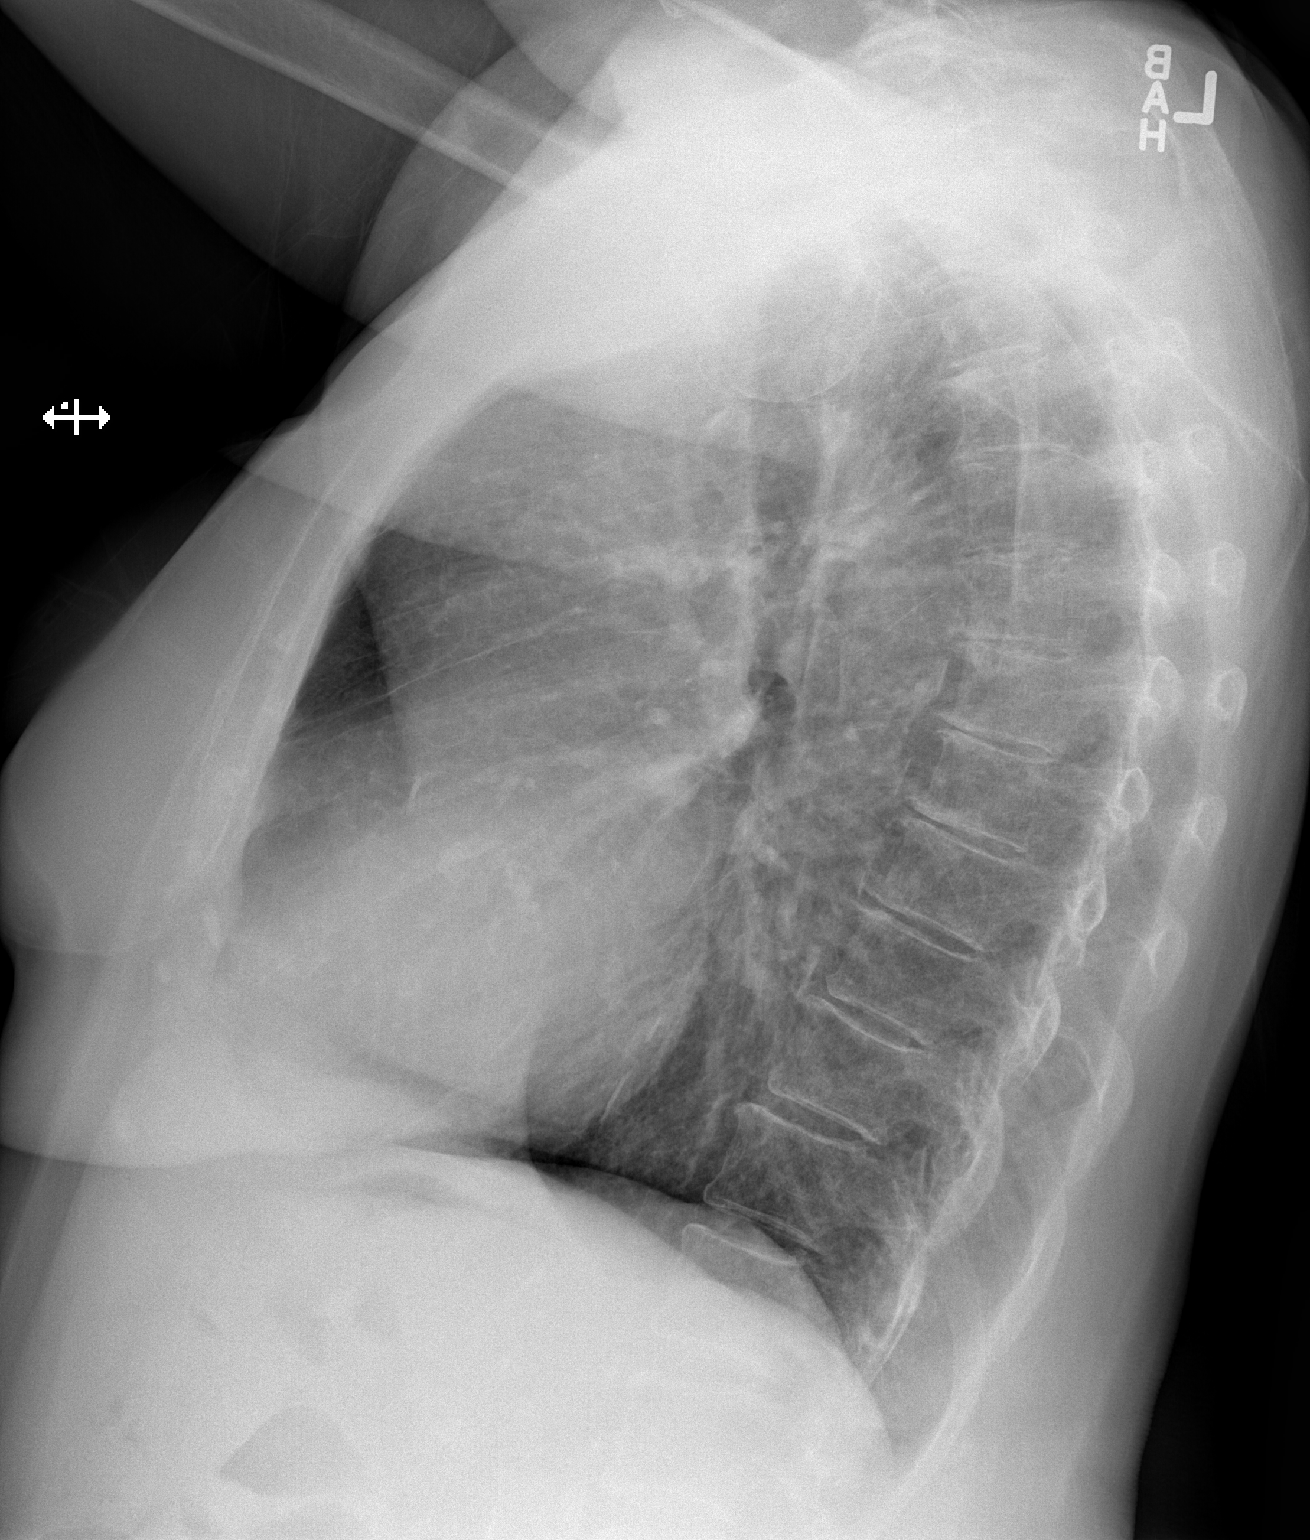

[2 of 2 positions shown; findings below may reference images not displayed]

FINDINGS: The heart size and mediastinal contours are stable.
There are probable aortic valve and/or coronary artery
calcifications.  Mild upper lobe scarring and left apical pleural
calcifications are stable.  There is no edema, confluent airspace
opacity or pleural effusion.  There is asymmetry of the breasts
consistent with prior partial mastectomy on the left.  There is no
pleural effusion.  Mild glenohumeral degenerative changes are
present bilaterally.  There is no evidence of acute osseous
abnormality.
IMPRESSION: Stable postoperative chest.  No acute cardiopulmonary process.

## 2013-07-04 NOTE — Assessment & Plan Note (Signed)
Well-controlled on current medications 

## 2013-07-04 NOTE — Assessment & Plan Note (Addendum)
She did have paroxysmal atrial fibrillation in the perioperative period.  But this was not an issue postop.an event  monitor performed late last year showed sinus rhythm.

## 2013-07-04 NOTE — Progress Notes (Signed)
07/04/2013 [name]   1932-02-02  [id number]  Primary Physician [name], MD Primary Cardiologist: Lorretta Harp MD Renae Gloss   HPI:  The patient is a 80 year old mildly overweight married Caucasian female who I last saw 10/02/12.. She is a mother of 2, grandmother to 1 grandchild. I saw her a year ago . Her risk factors include hypertension and family history. A brother died at age 6 from heart-related issues while he was being operated on. She has never had a heart attack or stroke. She does have moderate aortic stenosis by 2D echocardiogram last performed a year ago with a valve area of 0.83 cm2, peak gradient of 57, and mean of 35. She had negative Myoview on August 13, 2010. Since I saw her last, she developed exertional jaw pain, which was fairly reproducible. Her last lipid profile a year ago was excellent with total cholesterol 166, LDL 89, HDL 44. 2-D echo was performed showed critical aortic stenosis the valve area 0.5 cm. Based on thisthe patient underwent right left heart cardiac catheterization by myself revealing normal coronaries and normal left function. She had critical aortic stenosis and ultimately underwent bioprosthetic aortic valve replacement by Dr. Roxy Manns on 08/30/12 with an Edwards magna ease pericardial tissue valve (21 mm) excellent result. Her postop course was complicated by nausea and paroxysmal atrial fibrillation for which she was treated with low dose amiodarone and Coumadin anticoagulation. She currently has had no further episodes of PAF. Her Coumadin and amiodarone discontinued. Her followup 2-D echocardiogram performed in August of last year revealed normal LV function with a well-functioning aortic bioprosthesis. She denies chest pain or shortness of breath.    Current Outpatient Prescriptions  Medication Sig Dispense Refill  . acetaminophen (TYLENOL) 325 MG tablet Take 2 tablets (650 mg total) by mouth every 6 (six) hours as needed.       Marland Kitchen albuterol (PROVENTIL HFA;VENTOLIN HFA) 108 (90 BASE) MCG/ACT inhaler Inhale 2 puffs into the lungs every 6 (six) hours as needed for wheezing or shortness of breath.  1 Inhaler  2  . Biotin 5000 MCG CAPS Take 1 capsule by mouth daily.      . budesonide-formoterol (SYMBICORT) 160-4.5 MCG/ACT inhaler Inhale 2 puffs into the lungs 2 (two) times daily.  1 Inhaler  3  . Calcium Citrate-Vitamin D (CITRACAL + D PO) Take 1 tablet by mouth 2 (two) times daily.       Marland Kitchen dextromethorphan-guaiFENesin (MUCINEX DM) 30-600 MG per 12 hr tablet Take 1 tablet by mouth 2 (two) times daily.  10 tablet  0  . fesoterodine (TOVIAZ) 8 MG TB24 tablet Take 1 tablet by mouth daily for urine frequency  30 tablet  2  . fluticasone (FLONASE) 50 MCG/ACT nasal spray       . LORazepam (ATIVAN) 0.5 MG tablet Take 1 tablet by mouth every 6 hours as needed  40 tablet  0  . Magnesium Hydroxide (PHILLIPS MILK OF MAGNESIA PO) Take 15 mL by mouth. Take daily at 6 o'clock at night      . metoprolol tartrate (LOPRESSOR) 25 MG tablet Take 25 mg by mouth 2 (two) times daily.      Marland Kitchen omeprazole (PRILOSEC) 20 MG capsule TAKE 2 CAPSULES BY MOUTH DAILY FOR REFLUX  60 capsule  5  . shark liver oil-cocoa butter (PREPARATION H) 0.25-3-85.5 % suppository Place 1 suppository rectally as needed.      . sodium chloride (OCEAN) 0.65 % SOLN nasal spray Place 1  spray into both nostrils as needed for congestion.  1 Bottle  1  . vitamin C (ASCORBIC ACID) 500 MG tablet Take 500 mg by mouth daily.      . vitamin E 400 UNIT capsule Take 400 Units by mouth daily.      . zoledronic acid (RECLAST) 5 MG/100ML SOLN injection Inject 100 mLs (5 mg total) into the vein once.  100 mL  0   No current facility-administered medications for this visit.    Allergies  Allergen Reactions  . Codeine Rash    All over the body.    History   Social History  . Marital Status: Married    Spouse Name: N/A    Number of Children: N/A  . Years of Education: N/A    Occupational History  . Not on file.   Social History Main Topics  . Smoking status: Never Smoker   . Smokeless tobacco: Never Used  . Alcohol Use: No  . Drug Use: No  . Sexual Activity: Not on file   Other Topics Concern  . Not on file   Social History Narrative  . No narrative on file     Review of Systems: General: negative for chills, fever, night sweats or weight changes.  Cardiovascular: negative for chest pain, dyspnea on exertion, edema, orthopnea, palpitations, paroxysmal nocturnal dyspnea or shortness of breath Dermatological: negative for rash Respiratory: negative for cough or wheezing Urologic: negative for hematuria Abdominal: negative for nausea, vomiting, diarrhea, bright red blood per rectum, melena, or hematemesis Neurologic: negative for visual changes, syncope, or dizziness All other systems reviewed and are otherwise negative except as noted above.    Blood pressure 148/72, pulse 61, height 5\' 3"  (1.6 m), weight 165 lb (74.844 kg).  General appearance: alert and no distress Neck: no adenopathy, no JVD, supple, symmetrical, trachea midline, thyroid not enlarged, symmetric, no tenderness/mass/nodules and bilateral carotid bruits Lungs: clear to auscultation bilaterally Heart: soft outflow tach murmur consistent with aortic stenosis Extremities: extremities normal, atraumatic, no cyanosis or edema  EKG normal sinus rhythm at 61 without ST or T wave changes  ASSESSMENT AND PLAN:   Aortic stenosis- critical AS at cath 08/14/12, with surgery Status post aortic valve replacement by Dr. Roxy Manns 08/30/12 with a Edwards magna ease  Pericardial valve (21 mm)  with excellent results. A 2-D echocardiogram performed on the left revealed normal systolic function with a well functioning aortic bioprosthesis. She has been well since.  HTN (hypertension) Well-controlled on current medications  PAF (paroxysmal atrial fibrillation) She did have paroxysmal atrial  fibrillation in the perioperative period.  But this was not an issue postop.an event  monitor performed late last year showed sinus rhythm.      Lorretta Harp MD FACP,FACC,FAHA, [facility] 07/04/2013 11:07 AM

## 2013-07-04 NOTE — Assessment & Plan Note (Signed)
Status post aortic valve replacement by Dr. Roxy Manns 08/30/12 with a Edwards magna ease  Pericardial valve (21 mm)  with excellent results. A 2-D echocardiogram performed on the left revealed normal systolic function with a well functioning aortic bioprosthesis. She has been well since.

## 2013-07-04 NOTE — Patient Instructions (Signed)
We request that you follow-up in: 6 months with Cecilie Kicks NP and in 1 year with Dr Andria Rhein will receive a reminder letter in the mail two months in advance. If you don't receive a letter, please call our office to schedule the follow-up appointment.  Dr Gwenlyn Found has schedule an  echocardiogram in August. Echocardiography is a painless test that uses sound waves to create images of your heart. It provides your doctor with information about the size and shape of your heart and how well your heart's chambers and valves are working. This procedure takes approximately one hour. There are no restrictions for this procedure.

## 2013-07-06 ENCOUNTER — Telehealth (HOSPITAL_COMMUNITY): Payer: Self-pay | Admitting: *Deleted

## 2013-07-09 DIAGNOSIS — N3946 Mixed incontinence: Secondary | ICD-10-CM | POA: Diagnosis not present

## 2013-07-09 DIAGNOSIS — R350 Frequency of micturition: Secondary | ICD-10-CM | POA: Diagnosis not present

## 2013-07-17 ENCOUNTER — Ambulatory Visit (HOSPITAL_COMMUNITY): Payer: Medicare Other

## 2013-07-25 IMAGING — CR DG CHEST 2V
2 series · 2 of 2 positions shown · non-contrast
Comparison: Multiple priors

CLINICAL DATA: Status post internal valve replacement for aortic
stenosis.

CHEST - 2 VIEW

[w chest lat]
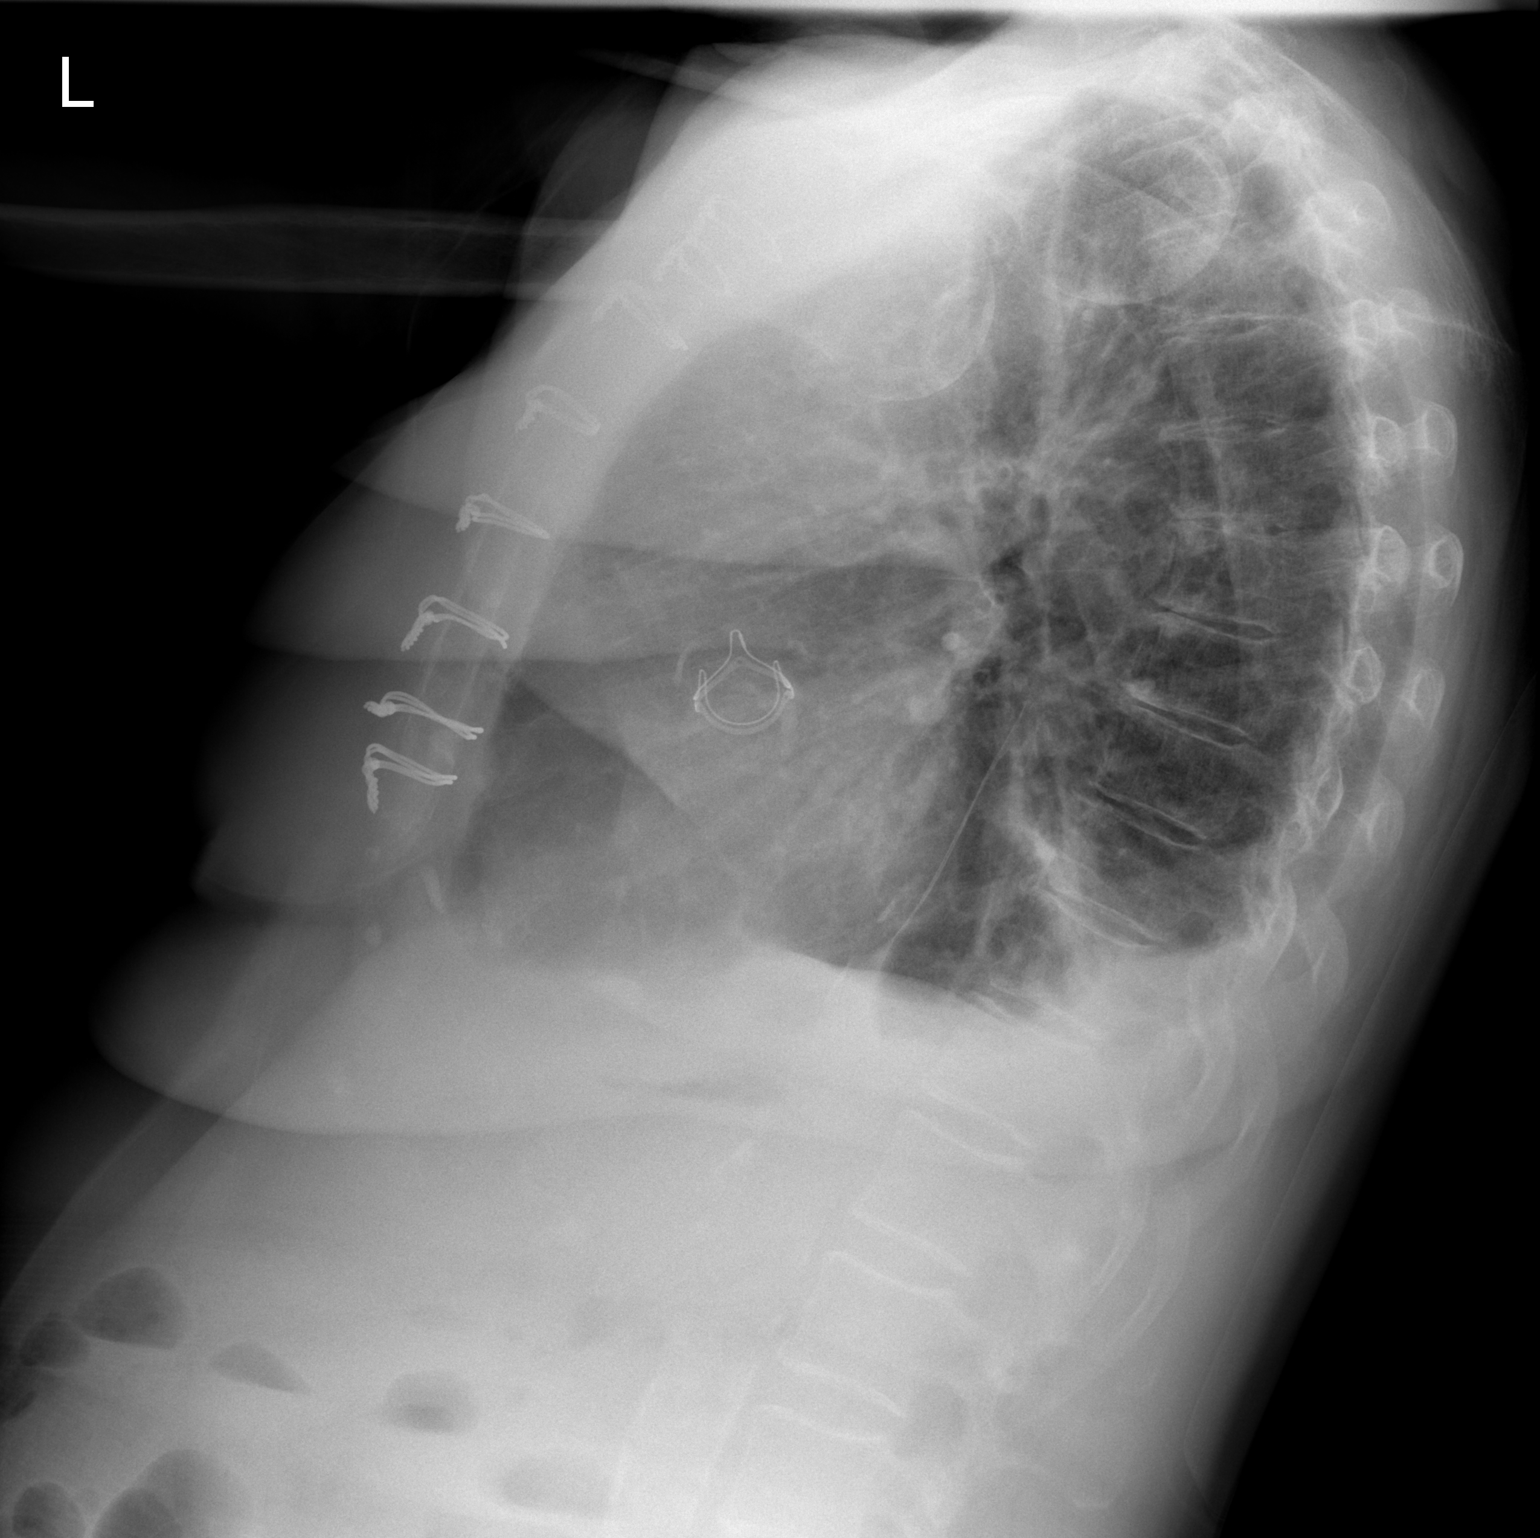

[w chest ap]
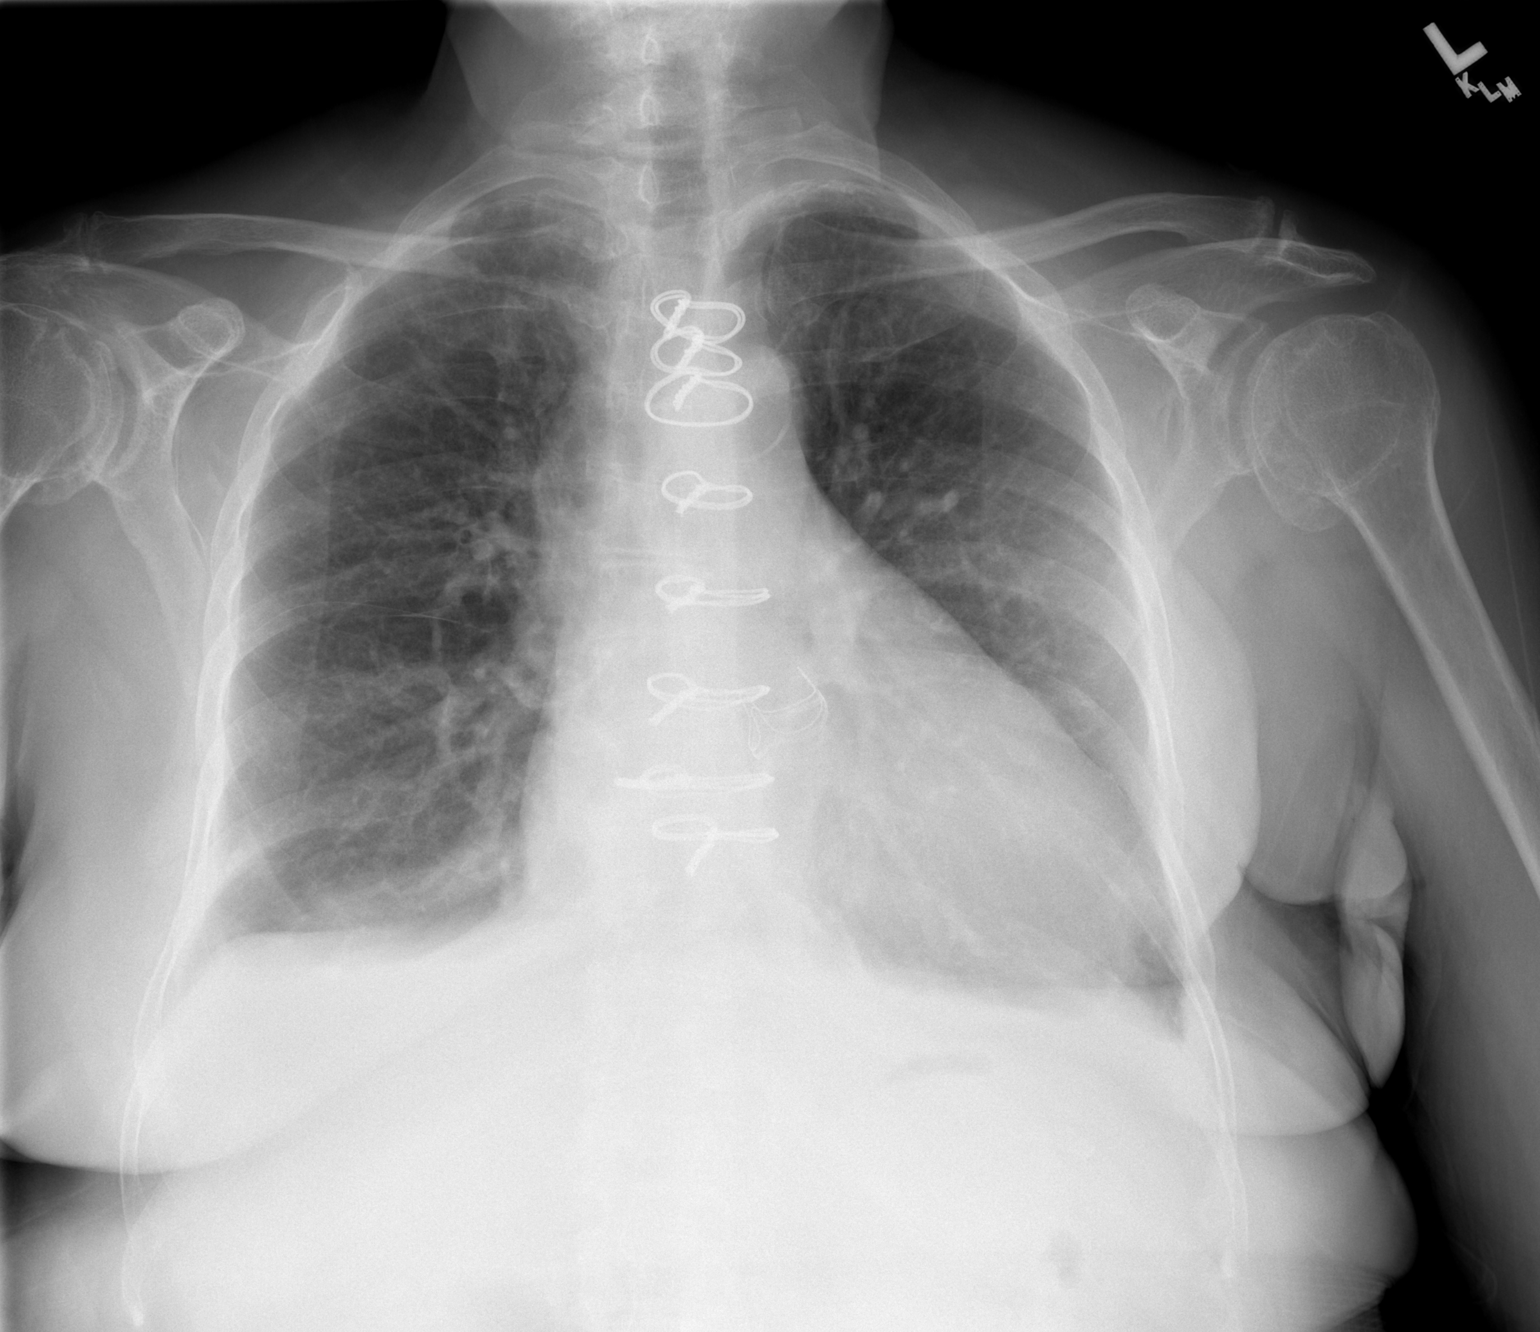

[2 of 2 positions shown; findings below may reference images not displayed]

FINDINGS: Cardiac and mediastinal contours are similar.  There is
mild pulmonary vascular congestion.  Bilateral pleural effusions
and adjacent airspace opacities are unchanged.  No acute osseous
abnormalities.
IMPRESSION: 1. No significant change in bibasilar opacities, likely
representing a combination of atelectasis and pleural fluid.

2. Mild pulmonary vascular congestion.

## 2013-07-26 ENCOUNTER — Other Ambulatory Visit: Payer: Self-pay | Admitting: Internal Medicine

## 2013-07-26 MED ORDER — HYDROXYZINE HCL 25 MG PO TABS: ORAL_TABLET | ORAL | Status: DC

## 2013-07-26 NOTE — Telephone Encounter (Signed)
Patient and Pharmacy requested refill

## 2013-07-27 DIAGNOSIS — J385 Laryngeal spasm: Secondary | ICD-10-CM | POA: Diagnosis not present

## 2013-08-08 ENCOUNTER — Telehealth: Payer: Self-pay

## 2013-08-08 NOTE — Telephone Encounter (Signed)
She can take mucinex 00 mg every 12 hours for a week for her cough. Reassess next week if no improvement

## 2013-08-08 NOTE — Telephone Encounter (Signed)
Message left on triage VM: " I need something for my cough"  I contacted patient, x 3 days patient with cough, sore throat (related to drainage), and chills. Patient aware we do not have any available appointments this week. I will send message to Dr.Pandey for review. Dr.Pandey please advise   Pharmacy [facility])

## 2013-08-09 NOTE — Telephone Encounter (Signed)
Patient notified

## 2013-08-15 ENCOUNTER — Encounter: Payer: Self-pay | Admitting: Internal Medicine

## 2013-08-15 ENCOUNTER — Ambulatory Visit (INDEPENDENT_AMBULATORY_CARE_PROVIDER_SITE_OTHER): Payer: Medicare Other | Admitting: Internal Medicine

## 2013-08-15 VITALS — BP 122/60 | HR 69 | Temp 98.2°F | Resp 18 | Wt 164.2 lb

## 2013-08-15 DIAGNOSIS — J441 Chronic obstructive pulmonary disease with (acute) exacerbation: Secondary | ICD-10-CM | POA: Diagnosis not present

## 2013-08-15 MED ORDER — PREDNISONE 50 MG PO TABS: ORAL_TABLET | ORAL | Status: DC

## 2013-08-15 MED ORDER — CIPROFLOXACIN HCL 500 MG PO TABS: 500.0000 mg | ORAL_TABLET | Freq: Two times a day (BID) | ORAL | Status: DC

## 2013-08-15 NOTE — Progress Notes (Signed)
Patient ID: [name], female   DOB: [birth year], 80 y.o.   MRN: [id number]    Chief Complaint  Patient presents with  . Acute Visit    ongoing cough   Allergies  Allergen Reactions  . Codeine Rash    All over the body.   HPI   80 y/o female patient is seen today for follow up on her cough. She has ongoing cough with phleghm and increased sputum production. She is using her symbicort and prn albuterol with mucinex without much relief but has worsening of her symptom interrupting her sleep and making her short of breath. Remains afebrile. No chest pain. Feels congested in her chest.    ROS No fever or chills No falls reported No chest pain  No nausea, vomiting, abdominal pain No headache or blurry vision  Past Medical History  Diagnosis Date  . Arthritis   . Hyperlipidemia   . Heart murmur   . Hearing loss   . Change in voice   . Osteoporosis   . Cancer     Breast  . Aortic valve disorder 08/13/2011    ECHO - EF >35%; mild diastolic dysfunction; calcified aortic valve, not well visualized; mod/severe aortic stenosis w/ worsening gradients when compared to 2012  . PVD (peripheral vascular disease) 08/13/2010    R/P MV - normal pattern of perfusion in all regions, EF 76%; no significant wall abnormalities noted; normal perfusion study  . Carotid bruit 08/06/2008    Doppler - R ECA demonstrates noarrowing w/ elevated velocities consistent w/ >70% diameter reduction; R and L ICAs show no evidence of diameter reduction, significant tortuosity or vascular abnormality;   . Aortic stenosis 07/20/2012    Aortic stenosis   . Hypertension     dr Gwenlyn Found  . S/P aortic valve replacement with bioprosthetic valve 08/30/2012    21 mm [facility] Ease bovine pericardial tissue valve  . PAF (paroxysmal atrial fibrillation) 09/04/2012   Medication reviewed. See [facility]  Physical exam BP 122/60  Pulse 69  Temp(Src) 98.2 F (36.8 C)  Resp 18  Wt 164 lb 3.2 oz (74.481 kg)  SpO2  98%  General- elderly female in no acute distress Head- atraumatic, normocephalic, no sinus tenderness Eyes- PERRLA, EOMI, no pallor, no icterus, no discharge Neck- no lymphadenopathy, no thyromegaly Ears- left ear normal tympanic membrane and normal external ear canal , right ear normal tympanic membrane and normal external ear canal Chest- no chest wall deformities, no chest wall tenderness Cardiovascular- normal s1,s2, no murmurs/ rubs/ gallops Respiratory- bilateral poor air entry with rhonchi and expiratory wheezing Abdomen- bowel sounds present, soft, non tender Musculoskeletal- able to move all 4 extremities Psychiatry- alert and oriented to person, place and time, normal mood and affect  Assessment/plan  1. COPD exacerbation Will need to treat her with prednisone 50 mg daily for a week, ciprofloxacin 500 mg bid for a week and to use her symbicort and albuterol as advised. After resolution of exacerbation, will need to add a LAMA to current regimen of steroid-LABA for maintenance.reassess if no improvement

## 2013-08-28 ENCOUNTER — Other Ambulatory Visit: Payer: Self-pay | Admitting: *Deleted

## 2013-08-28 ENCOUNTER — Telehealth: Payer: Self-pay | Admitting: Internal Medicine

## 2013-08-28 DIAGNOSIS — R0989 Other specified symptoms and signs involving the circulatory and respiratory systems: Secondary | ICD-10-CM

## 2013-08-28 NOTE — Telephone Encounter (Signed)
Scheduled an appointment for tomorrow with Dr. Bubba Camp and sent order for patient to have chest X-ray done before the appointment tomorrow. Patient Notified and agreed.

## 2013-08-28 NOTE — Telephone Encounter (Signed)
If no improvement, will get chest xray to rule out pneumonia. Will see result of your chest xray. Can you come in tomorrow- schedule to see me.

## 2013-08-28 NOTE — Telephone Encounter (Signed)
PATIENT CALLED AND SAID SHE STILL IS NOT FEELING BETTER AND CONTINUES TO SPIT UP GREEN STUFF. SHE WAS SEEN 08/15/13 AND SAID THAT DR. PANDEY TOLD HER IF SHE WASN'T FEELING ANY BETTER TO CALL BACK.. SHE WOULD LIKE TO KNOW WHAT ELSE TO DO BECAUSE SHE WANTS TO GET RID OF THIS AND DOESN'T WANT TO HAVE TO WAIT UNTIL NEXT WEEK FOR IT TO BE ADDRESSED.Marland Kitchen  SHE SAID SHE HAS TO GO OUT BUT WOULD LIKE SOMEONE TO CALL HER ABOUT 4 SO THAT SHE HAS TIME TO GO TO THE DRUG STORE BEFORE THEY CLOSE.

## 2013-08-29 ENCOUNTER — Ambulatory Visit
Admission: RE | Admit: 2013-08-29 | Discharge: 2013-08-29 | Disposition: A | Discharge status: Home / Self Care | Payer: Medicare Other | Source: Ambulatory Visit | Attending: Internal Medicine | Admitting: Internal Medicine

## 2013-08-29 ENCOUNTER — Encounter: Payer: Self-pay | Admitting: Internal Medicine

## 2013-08-29 ENCOUNTER — Ambulatory Visit (INDEPENDENT_AMBULATORY_CARE_PROVIDER_SITE_OTHER): Payer: Medicare Other | Admitting: Internal Medicine

## 2013-08-29 VITALS — BP 126/70 | HR 68 | Temp 98.2°F | Wt 167.0 lb

## 2013-08-29 DIAGNOSIS — J411 Mucopurulent chronic bronchitis: Secondary | ICD-10-CM

## 2013-08-29 DIAGNOSIS — R0989 Other specified symptoms and signs involving the circulatory and respiratory systems: Secondary | ICD-10-CM | POA: Diagnosis not present

## 2013-08-29 MED ORDER — TIOTROPIUM BROMIDE MONOHYDRATE 18 MCG IN CAPS: 18.0000 ug | ORAL_CAPSULE | Freq: Every day | RESPIRATORY_TRACT | Status: DC

## 2013-08-29 MED ORDER — ROFLUMILAST 500 MCG PO TABS: 500.0000 ug | ORAL_TABLET | Freq: Every day | ORAL | Status: DC

## 2013-08-29 NOTE — Progress Notes (Signed)
Patient ID: [name], female   DOB: 1931/12/08, 80 y.o.   MRN: [id number]    Chief Complaint  Patient presents with  . Acute Visit    copd follow up   Allergies  Allergen Reactions  . Codeine Rash    All over the body.   HPI   80 y/o female patient is seen today for follow up on her copd. She recently was treated for copd exacerbation. She mentions that clinically she feels improved but is not back to her baseline. She still has some cough lingering and occasional wheezing. On prn albuterol and symbicort bid for now with prn mucinex. She has had 2 exacerbations recently Remains afebrile. No chest pain. Feels congested in her chest.    ROS No fever or chills No falls reported No chest pain   No nausea, vomiting, abdominal pain No headache or blurry vision  Current Outpatient Prescriptions on File Prior to Visit  Medication Sig Dispense Refill  . acetaminophen (TYLENOL) 325 MG tablet Take 2 tablets (650 mg total) by mouth every 6 (six) hours as needed.      Marland Kitchen albuterol (PROVENTIL HFA;VENTOLIN HFA) 108 (90 BASE) MCG/ACT inhaler Inhale 2 puffs into the lungs every 6 (six) hours as needed for wheezing or shortness of breath.  1 Inhaler  2  . Biotin 5000 MCG CAPS Take 1 capsule by mouth daily.      . budesonide-formoterol (SYMBICORT) 160-4.5 MCG/ACT inhaler Inhale 2 puffs into the lungs 2 (two) times daily.  1 Inhaler  3  . Calcium Citrate-Vitamin D (CITRACAL + D PO) Take 1 tablet by mouth 2 (two) times daily.       Marland Kitchen dextromethorphan-guaiFENesin (MUCINEX DM) 30-600 MG per 12 hr tablet Take 1 tablet by mouth 2 (two) times daily.  10 tablet  0  . fesoterodine (TOVIAZ) 8 MG TB24 tablet Take 1 tablet by mouth daily for urine frequency  30 tablet  2  . fluticasone (FLONASE) 50 MCG/ACT nasal spray       . hydrOXYzine (ATARAX/VISTARIL) 25 MG tablet Take one tablet by mouth once daily as needed  30 tablet  1  . LORazepam (ATIVAN) 0.5 MG tablet Take 1 tablet by mouth every 6 hours as  needed  40 tablet  0  . Magnesium Hydroxide (PHILLIPS MILK OF MAGNESIA PO) Take 15 mL by mouth. Take daily at 6 o'clock at night      . metoprolol tartrate (LOPRESSOR) 25 MG tablet Take 25 mg by mouth 2 (two) times daily.      Marland Kitchen omeprazole (PRILOSEC) 20 MG capsule TAKE 2 CAPSULES BY MOUTH DAILY FOR REFLUX  60 capsule  5  . shark liver oil-cocoa butter (PREPARATION H) 0.25-3-85.5 % suppository Place 1 suppository rectally as needed.      . sodium chloride (OCEAN) 0.65 % SOLN nasal spray Place 1 spray into both nostrils as needed for congestion.  1 Bottle  1  . vitamin C (ASCORBIC ACID) 500 MG tablet Take 500 mg by mouth daily.      . vitamin E 400 UNIT capsule Take 400 Units by mouth daily.      . zoledronic acid (RECLAST) 5 MG/100ML SOLN injection Inject 100 mLs (5 mg total) into the vein once.  100 mL  0   No current facility-administered medications on file prior to visit.    Physical exam BP 126/70  Pulse 68  Temp(Src) 98.2 F (36.8 C) (Oral)  Wt 167 lb (75.751 kg)  SpO2 98%  General- elderly female in no acute distress Head- atraumatic, normocephalic, no sinus tenderness Eyes- PERRLA, EOMI, no pallor, no icterus, no discharge Neck- no lymphadenopathy, no thyromegaly Ears- left ear normal tympanic membrane and normal external ear canal , right ear normal tympanic membrane and normal external ear canal Chest- no chest wall deformities, no chest wall tenderness Cardiovascular- normal s1,s2, no murmurs/ rubs/ gallops Respiratory- bilateral poor air entry with occassional wheezing Abdomen- bowel sounds present, soft, non tender Musculoskeletal- able to move all 4 extremities Psychiatry- alert and oriented to person, place and time, normal mood and affect  Assessment/plan  1. Mucopurulent chronic bronchitis Has chronic bronchitis and recent frequent exacerbation. Recently treated for an exacerbation. Will add spiriva once a day for maintenance therapy and daliresp 500 mcg daily to  help prevent her frequent exacerbations. Reassess in 2 weeks. If pt has no imporvement in her symptom or any worsening noted, will refer to pulmonary

## 2013-09-04 ENCOUNTER — Ambulatory Visit: Payer: Medicare Other | Admitting: Internal Medicine

## 2013-09-07 DIAGNOSIS — M25529 Pain in unspecified elbow: Secondary | ICD-10-CM | POA: Diagnosis not present

## 2013-09-07 DIAGNOSIS — S52123A Displaced fracture of head of unspecified radius, initial encounter for closed fracture: Secondary | ICD-10-CM | POA: Diagnosis not present

## 2013-09-12 ENCOUNTER — Encounter: Payer: Self-pay | Admitting: Internal Medicine

## 2013-09-12 ENCOUNTER — Telehealth: Payer: Self-pay | Admitting: *Deleted

## 2013-09-12 ENCOUNTER — Ambulatory Visit (INDEPENDENT_AMBULATORY_CARE_PROVIDER_SITE_OTHER): Payer: Medicare Other | Admitting: Internal Medicine

## 2013-09-12 VITALS — BP 136/64 | HR 71 | Temp 98.2°F | Wt 162.8 lb

## 2013-09-12 DIAGNOSIS — R05 Cough: Secondary | ICD-10-CM | POA: Diagnosis not present

## 2013-09-12 DIAGNOSIS — J449 Chronic obstructive pulmonary disease, unspecified: Secondary | ICD-10-CM | POA: Diagnosis not present

## 2013-09-12 DIAGNOSIS — R059 Cough, unspecified: Secondary | ICD-10-CM

## 2013-09-12 MED ORDER — LORAZEPAM 0.5 MG PO TABS: ORAL_TABLET | ORAL | Status: DC

## 2013-09-12 MED ORDER — CETIRIZINE-PSEUDOEPHEDRINE ER 5-120 MG PO TB12: 1.0000 | ORAL_TABLET | Freq: Every day | ORAL | Status: DC

## 2013-09-12 NOTE — Progress Notes (Signed)
Patient ID: [name], female   DOB: March 30, 1931, 80 y.o.   MRN: [id number]    Chief Complaint  Patient presents with  . Follow-up    2 week f/u COPD, refill on lorazepam   HPI 80 y/o female patient here for follow up on copd. Her breathing is better but has some cough especially with talking and taking deep breath.  Using her breathing treatment. Cost of daliresp is too much as not  Covered by insurance.  ROS No fever or chills No falls reported No chest pain   No nausea, vomiting, abdominal pain No headache or blurry vision No heartburn, on omeprazole 40 daily Wheezing under control Needs refill on her lorazepam  Past Medical History  Diagnosis Date  . Arthritis   . Hyperlipidemia   . Heart murmur   . Hearing loss   . Change in voice   . Osteoporosis   . Cancer     Breast  . Aortic valve disorder 08/13/2011    ECHO - EF >27%; mild diastolic dysfunction; calcified aortic valve, not well visualized; mod/severe aortic stenosis w/ worsening gradients when compared to 2012  . PVD (peripheral vascular disease) 08/13/2010    R/P MV - normal pattern of perfusion in all regions, EF 76%; no significant wall abnormalities noted; normal perfusion study  . Carotid bruit 08/06/2008    Doppler - R ECA demonstrates noarrowing w/ elevated velocities consistent w/ >70% diameter reduction; R and L ICAs show no evidence of diameter reduction, significant tortuosity or vascular abnormality;   . Aortic stenosis 07/20/2012    Aortic stenosis   . Hypertension     dr Gwenlyn Found  . S/P aortic valve replacement with bioprosthetic valve 08/30/2012    21 mm [facility] Ease bovine pericardial tissue valve  . PAF (paroxysmal atrial fibrillation) 09/04/2012   Current Outpatient Prescriptions on File Prior to Visit  Medication Sig Dispense Refill  . acetaminophen (TYLENOL) 325 MG tablet Take 2 tablets (650 mg total) by mouth every 6 (six) hours as needed.      Marland Kitchen albuterol (PROVENTIL HFA;VENTOLIN HFA) 108  (90 BASE) MCG/ACT inhaler Inhale 2 puffs into the lungs every 6 (six) hours as needed for wheezing or shortness of breath.  1 Inhaler  2  . Biotin 5000 MCG CAPS Take 1 capsule by mouth daily.      . budesonide-formoterol (SYMBICORT) 160-4.5 MCG/ACT inhaler Inhale 2 puffs into the lungs 2 (two) times daily.  1 Inhaler  3  . Calcium Citrate-Vitamin D (CITRACAL + D PO) Take 1 tablet by mouth 2 (two) times daily.       Marland Kitchen dextromethorphan-guaiFENesin (MUCINEX DM) 30-600 MG per 12 hr tablet Take 1 tablet by mouth 2 (two) times daily.  10 tablet  0  . fesoterodine (TOVIAZ) 8 MG TB24 tablet Take 1 tablet by mouth daily for urine frequency  30 tablet  2  . fluticasone (FLONASE) 50 MCG/ACT nasal spray       . hydrOXYzine (ATARAX/VISTARIL) 25 MG tablet Take one tablet by mouth once daily as needed  30 tablet  1  . Magnesium Hydroxide (PHILLIPS MILK OF MAGNESIA PO) Take 15 mL by mouth. Take daily at 6 o'clock at night      . metoprolol tartrate (LOPRESSOR) 25 MG tablet Take 25 mg by mouth 2 (two) times daily.      Marland Kitchen omeprazole (PRILOSEC) 20 MG capsule TAKE 2 CAPSULES BY MOUTH DAILY FOR REFLUX  60 capsule  5  . roflumilast (DALIRESP)  500 MCG TABS tablet Take 1 tablet (500 mcg total) by mouth daily.  30 tablet  5  . shark liver oil-cocoa butter (PREPARATION H) 0.25-3-85.5 % suppository Place 1 suppository rectally as needed.      . sodium chloride (OCEAN) 0.65 % SOLN nasal spray Place 1 spray into both nostrils as needed for congestion.  1 Bottle  1  . tiotropium (SPIRIVA) 18 MCG inhalation capsule Place 1 capsule (18 mcg total) into inhaler and inhale daily.  30 capsule  12  . vitamin C (ASCORBIC ACID) 500 MG tablet Take 500 mg by mouth daily.      . vitamin E 400 UNIT capsule Take 400 Units by mouth daily.      . zoledronic acid (RECLAST) 5 MG/100ML SOLN injection Inject 100 mLs (5 mg total) into the vein once.  100 mL  0   No current facility-administered medications on file prior to visit.    Physical  exam BP 136/64  Pulse 71  Temp(Src) 98.2 F (36.8 C) (Oral)  Wt 162 lb 12.8 oz (73.846 kg)  SpO2 98%  General- elderly female in no acute distress Head- atraumatic, normocephalic, no sinus tenderness Eyes- PERRLA, EOMI, no pallor, no icterus, no discharge Neck- no lymphadenopathy, no thyromegaly Cardiovascular- normal s1,s2 Mouth- oropharyngeal erythema Respiratory- bilateral poor air entry but no rhonchi or wheezing or crackles Abdomen- bowel sounds present, soft, non tender Musculoskeletal- able to move all 4 extremities Psychiatry- alert and oriented to person, place and time, normal mood and affect  Assessment/plan  1. Chronic obstructive pulmonary disease, unspecified COPD, unspecified chronic bronchitis type Continue spiriva and symbicort with prn albuterol. Some more sample of daliresp provided. Working on prior authorization. Continue prn mucinex  2. Cough Concern for allergic / reactive airway cough. Will add zyrtrec D to her prn mucinex and reassess

## 2013-09-12 NOTE — Telephone Encounter (Signed)
Dr. Bubba Camp wanted me to call patient's insurance company and get prior auth for Deliresp. Called and spoke with Levada Dy # [phone]. Per Dr. Bubba Camp patient has been paying almost $300.00 for medication. Stated that patient has had 3 attacks of COPD. Approved Case #: [id number] until 09/13/2014. Patient Notified and pharmacy Notified.

## 2013-10-10 DIAGNOSIS — M25529 Pain in unspecified elbow: Secondary | ICD-10-CM | POA: Diagnosis not present

## 2013-10-10 DIAGNOSIS — M25521 Pain in right elbow: Secondary | ICD-10-CM | POA: Insufficient documentation

## 2013-10-17 ENCOUNTER — Ambulatory Visit (HOSPITAL_COMMUNITY)
Admission: RE | Admit: 2013-10-17 | Discharge: 2013-10-17 | Disposition: A | Discharge status: Home / Self Care | Payer: Medicare Other | Source: Ambulatory Visit | Attending: Cardiovascular Disease | Admitting: Cardiovascular Disease

## 2013-10-17 DIAGNOSIS — I517 Cardiomegaly: Secondary | ICD-10-CM

## 2013-10-17 DIAGNOSIS — I359 Nonrheumatic aortic valve disorder, unspecified: Secondary | ICD-10-CM | POA: Diagnosis not present

## 2013-10-17 DIAGNOSIS — I350 Nonrheumatic aortic (valve) stenosis: Secondary | ICD-10-CM

## 2013-10-17 NOTE — Progress Notes (Signed)
2D Echocardiogram Complete.  10/17/2013   [name] [place], RDCS

## 2013-10-19 ENCOUNTER — Telehealth: Payer: Self-pay | Admitting: *Deleted

## 2013-10-19 NOTE — Telephone Encounter (Signed)
Echo results given

## 2013-10-19 NOTE — Telephone Encounter (Signed)
Message copied by Chauncy Lean on Fri Oct 19, 2013  2:45 PM ------      Message from: Lorretta Harp      Created: Thu Oct 18, 2013  9:35 AM       Nl LV fxn. Mild AS ------

## 2013-10-19 NOTE — Telephone Encounter (Signed)
Returning your call. °

## 2013-10-22 ENCOUNTER — Telehealth: Payer: Self-pay | Admitting: Cardiovascular Disease

## 2013-10-22 NOTE — Telephone Encounter (Signed)
Pt called stating that [name] called her Friday and she was returning her call. Please call  Thanks

## 2013-10-22 NOTE — Telephone Encounter (Signed)
Spoke with patient and gave echo results again

## 2013-11-12 ENCOUNTER — Other Ambulatory Visit (HOSPITAL_BASED_OUTPATIENT_CLINIC_OR_DEPARTMENT_OTHER): Payer: Self-pay | Admitting: Internal Medicine

## 2013-11-12 ENCOUNTER — Other Ambulatory Visit: Payer: Self-pay | Admitting: Cardiovascular Disease

## 2013-11-12 ENCOUNTER — Other Ambulatory Visit (HOSPITAL_BASED_OUTPATIENT_CLINIC_OR_DEPARTMENT_OTHER): Payer: Self-pay | Admitting: Geriatric Medicine

## 2013-11-12 NOTE — Telephone Encounter (Signed)
Rx was sent to pharmacy electronically. 

## 2013-11-23 DIAGNOSIS — J385 Laryngeal spasm: Secondary | ICD-10-CM | POA: Diagnosis not present

## 2013-11-27 ENCOUNTER — Ambulatory Visit: Payer: Medicare Other

## 2013-11-27 DIAGNOSIS — Z23 Encounter for immunization: Secondary | ICD-10-CM

## 2013-12-11 ENCOUNTER — Ambulatory Visit (INDEPENDENT_AMBULATORY_CARE_PROVIDER_SITE_OTHER): Payer: Medicare Other | Admitting: Internal Medicine

## 2013-12-11 ENCOUNTER — Encounter: Payer: Self-pay | Admitting: Internal Medicine

## 2013-12-11 VITALS — BP 144/78 | HR 68 | Temp 97.7°F | Resp 12 | Ht 63.0 in | Wt 160.0 lb

## 2013-12-11 DIAGNOSIS — J449 Chronic obstructive pulmonary disease, unspecified: Secondary | ICD-10-CM | POA: Diagnosis not present

## 2013-12-11 DIAGNOSIS — J383 Other diseases of vocal cords: Secondary | ICD-10-CM | POA: Diagnosis not present

## 2013-12-11 DIAGNOSIS — I10 Essential (primary) hypertension: Secondary | ICD-10-CM

## 2013-12-11 DIAGNOSIS — M810 Age-related osteoporosis without current pathological fracture: Secondary | ICD-10-CM

## 2013-12-11 DIAGNOSIS — Z953 Presence of xenogenic heart valve: Secondary | ICD-10-CM

## 2013-12-11 DIAGNOSIS — Z954 Presence of other heart-valve replacement: Secondary | ICD-10-CM

## 2013-12-11 DIAGNOSIS — K219 Gastro-esophageal reflux disease without esophagitis: Secondary | ICD-10-CM | POA: Diagnosis not present

## 2013-12-11 DIAGNOSIS — N3281 Overactive bladder: Secondary | ICD-10-CM

## 2013-12-11 MED ORDER — ALBUTEROL SULFATE HFA 108 (90 BASE) MCG/ACT IN AERS: 1.0000 | INHALATION_SPRAY | Freq: Four times a day (QID) | RESPIRATORY_TRACT | Status: DC | PRN

## 2013-12-11 MED ORDER — BUDESONIDE-FORMOTEROL FUMARATE 160-4.5 MCG/ACT IN AERO: 2.0000 | INHALATION_SPRAY | Freq: Two times a day (BID) | RESPIRATORY_TRACT | Status: DC

## 2013-12-11 MED ORDER — TIOTROPIUM BROMIDE MONOHYDRATE 18 MCG IN CAPS: 18.0000 ug | ORAL_CAPSULE | Freq: Every day | RESPIRATORY_TRACT | Status: DC

## 2013-12-11 NOTE — Progress Notes (Signed)
Patient ID: [name], female   DOB: May 14, 1931, 80 y.o.   MRN: [id number]    Chief Complaint  Patient presents with  . Medical Management of Chronic Issues    3 month follow-up, no recent labs    Allergies  Allergen Reactions  . Codeine Rash    All over the body.   HPI 80 y/o female patient is seen today for follow up. She has copd, mild Aortic stenosis, HTN, osteoporosis.   Her breathing is stable and she has stopped taking her albuterol, symbicort and spiriva. She could not take daliresp with cost issue (despite of prior-auth the cost was high).  Review of Systems  Constitutional: Negative for fever, chills, diaphoresis.  HENT: Negative for congestion, hearing loss and sore throat.   Eyes: Negative for blurred vision, double vision and discharge.  Respiratory: Negative for cough, sputum production, shortness of breath and wheezing.   Cardiovascular: Negative for chest pain, palpitations, orthopnea and leg swelling.  Gastrointestinal: Negative for heartburn, nausea, vomiting, abdominal pain, diarrhea and constipation.  Genitourinary: Negative for dysuria Musculoskeletal: Negative for back pain, falls Skin: Negative for itching and rash.  Neurological: Negative for dizziness, tingling, focal weakness and headaches.  Psychiatric/Behavioral: Negative for depression.    Past Medical History  Diagnosis Date  . Arthritis   . Hyperlipidemia   . Heart murmur   . Hearing loss   . Change in voice   . Osteoporosis   . Cancer     Breast  . Aortic valve disorder 08/13/2011    ECHO - EF >56%; mild diastolic dysfunction; calcified aortic valve, not well visualized; mod/severe aortic stenosis w/ worsening gradients when compared to 2012  . PVD (peripheral vascular disease) 08/13/2010    R/P MV - normal pattern of perfusion in all regions, EF 76%; no significant wall abnormalities noted; normal perfusion study  . Carotid bruit 08/06/2008    Doppler - R ECA demonstrates noarrowing w/  elevated velocities consistent w/ >70% diameter reduction; R and L ICAs show no evidence of diameter reduction, significant tortuosity or vascular abnormality;   . Aortic stenosis 07/20/2012    Aortic stenosis   . Hypertension     dr Gwenlyn Found  . S/P aortic valve replacement with bioprosthetic valve 08/30/2012    21 mm [facility] Ease bovine pericardial tissue valve  . PAF (paroxysmal atrial fibrillation) 09/04/2012  . COPD (chronic obstructive pulmonary disease)    Current Outpatient Prescriptions on File Prior to Visit  Medication Sig Dispense Refill  . acetaminophen (TYLENOL) 325 MG tablet Take 2 tablets (650 mg total) by mouth every 6 (six) hours as needed.      . Biotin 5000 MCG CAPS Take 1 capsule by mouth daily.      . Calcium Citrate-Vitamin D (CITRACAL + D PO) Take 1 tablet by mouth 2 (two) times daily.       . cetirizine-pseudoephedrine (ZYRTEC-D) 5-120 MG per tablet Take 1 tablet by mouth daily.  30 tablet  3  . dextromethorphan-guaiFENesin (MUCINEX DM) 30-600 MG per 12 hr tablet Take 1 tablet by mouth 2 (two) times daily.  10 tablet  0  . fluticasone (FLONASE) 50 MCG/ACT nasal spray       . hydrOXYzine (ATARAX/VISTARIL) 25 MG tablet Take one tablet by mouth once daily as needed  30 tablet  1  . LORazepam (ATIVAN) 0.5 MG tablet Take 1 tablet by mouth every 6 hours as needed  40 tablet  0  . Magnesium Hydroxide (PHILLIPS MILK OF  MAGNESIA PO) Take 15 mL by mouth. Take daily at 6 o'clock at night      . metoprolol tartrate (LOPRESSOR) 25 MG tablet Take 1 tablet (25 mg total) by mouth 2 (two) times daily.  60 tablet  8  . omeprazole (PRILOSEC) 20 MG capsule TAKE 2 CAPSULES BY MOUTH DAILY FOR REFLUX  60 capsule  5  . roflumilast (DALIRESP) 500 MCG TABS tablet Take 1 tablet (500 mcg total) by mouth daily.  30 tablet  5  . shark liver oil-cocoa butter (PREPARATION H) 0.25-3-85.5 % suppository Place 1 suppository rectally as needed.      . sodium chloride (OCEAN) 0.65 % SOLN nasal spray Place 1  spray into both nostrils as needed for congestion.  1 Bottle  1  . TOVIAZ 8 MG TB24 tablet TAKE 1 TABLET BY MOUTH DAILY FOR URINE FREQUENCY  30 tablet  5  . vitamin C (ASCORBIC ACID) 500 MG tablet Take 500 mg by mouth daily.      . vitamin E 400 UNIT capsule Take 400 Units by mouth daily.      . zoledronic acid (RECLAST) 5 MG/100ML SOLN injection Inject 100 mLs (5 mg total) into the vein once.  100 mL  0   No current facility-administered medications on file prior to visit.    Physical exam BP 144/78  Pulse 68  Temp(Src) 97.7 F (36.5 C) (Oral)  Resp 12  Ht 5\' 3"  (1.6 m)  Wt 160 lb (72.576 kg)  BMI 28.35 kg/m2  SpO2 93%  General- elderly female in no acute distress, thin built Head- atraumatic, normocephalic, no sinus tenderness Eyes- PERRLA, EOMI, no pallor, no icterus, no discharge Neck- no lymphadenopathy, no thyromegaly Cardiovascular- normal s1,s2, no murmurs/ rubs/ gallops Respiratory- bilateral poor air entry, no wheeze/ rhonchi or crackles Abdomen- bowel sounds present, soft, non tender Musculoskeletal- able to move all 4 extremities Psychiatry- alert and oriented to person, place and time, normal mood and affect  Assessment/plan  1. Essential hypertension Stable bp continue lopressor  2. Chronic obstructive pulmonary disease, unspecified COPD, unspecified chronic bronchitis type Stable, pt stopped all meds with cost issue. Sample on spiriva and symbicort provided. Advised to have albuterol for rescue inhaler. D/c daliresp with cost issue. Advised on med compliance, asked to get her formulary book and or find from insurance what alternatives are covered. Pt will do this  3. Gastroesophageal reflux disease, esophagitis presence not specified Stable. Continue prilosec  4. Laryngeal dystonia Recent botulinum injection recieved  5. Osteoporosis Stable, continue reclast and citracal-vit d  6. Overactive bladder Continue toviaz  7. S/P aortic valve replacement with  bioprosthetic valve Stable, follows with cardiology

## 2013-12-11 NOTE — Patient Instructions (Signed)
Take spiriva and symbicort inhaler everyday  Take albuterol inhaler every 6 hour as needed for rescue to help when you get short of breath  Take daliresp pill everyday.

## 2014-01-01 ENCOUNTER — Ambulatory Visit (INDEPENDENT_AMBULATORY_CARE_PROVIDER_SITE_OTHER): Payer: Medicare Other | Admitting: Internal Medicine

## 2014-01-01 ENCOUNTER — Encounter: Payer: Self-pay | Admitting: Internal Medicine

## 2014-01-01 VITALS — BP 136/78 | HR 54 | Resp 10 | Ht 63.0 in | Wt 159.0 lb

## 2014-01-01 DIAGNOSIS — J449 Chronic obstructive pulmonary disease, unspecified: Secondary | ICD-10-CM | POA: Diagnosis not present

## 2014-01-01 NOTE — Progress Notes (Signed)
Patient ID: [name], female   DOB: 10/17/1931, 80 y.o.   MRN: [id number]    Chief Complaint  Patient presents with  . Follow-up    Discuss medications- patient would like for doctor to consider lower-cost medicaitons on formulary    Allergies  Allergen Reactions  . Codeine Rash    All over the body.   HPI 80 y/o female patient is seen today for acute concerns. She has copd and is on spiriva, symbicort and prn albuterol. The cost of the medication is coming out to be high for the patient. She has these medication at home for now.   Review of Systems  Constitutional: Negative for fever, chills, diaphoresis.  HENT: Negative for congestion Respiratory: Negative for  sputum production, shortness of breath and wheezing. has occasional cough  Cardiovascular: Negative for chest pain, palpitations, orthopnea and leg swelling.  Psychiatric/Behavioral: Negative for depression.    Past Medical History  Diagnosis Date  . Arthritis   . Hyperlipidemia   . Heart murmur   . Hearing loss   . Change in voice   . Osteoporosis   . Cancer     Breast  . Aortic valve disorder 08/13/2011    ECHO - EF >67%; mild diastolic dysfunction; calcified aortic valve, not well visualized; mod/severe aortic stenosis w/ worsening gradients when compared to 2012  . PVD (peripheral vascular disease) 08/13/2010    R/P MV - normal pattern of perfusion in all regions, EF 76%; no significant wall abnormalities noted; normal perfusion study  . Carotid bruit 08/06/2008    Doppler - R ECA demonstrates noarrowing w/ elevated velocities consistent w/ >70% diameter reduction; R and L ICAs show no evidence of diameter reduction, significant tortuosity or vascular abnormality;   . Aortic stenosis 07/20/2012    Aortic stenosis   . Hypertension     dr Gwenlyn Found  . S/P aortic valve replacement with bioprosthetic valve 08/30/2012    21 mm [facility] Ease bovine pericardial tissue valve  . PAF (paroxysmal atrial fibrillation)  09/04/2012  . COPD (chronic obstructive pulmonary disease)    Current Outpatient Prescriptions on File Prior to Visit  Medication Sig Dispense Refill  . acetaminophen (TYLENOL) 325 MG tablet Take 2 tablets (650 mg total) by mouth every 6 (six) hours as needed.    Marland Kitchen albuterol (PROAIR HFA) 108 (90 BASE) MCG/ACT inhaler Inhale 1 puff into the lungs every 6 (six) hours as needed for wheezing or shortness of breath. (Patient taking differently: Inhale 2 puffs into the lungs every 6 (six) hours as needed for wheezing or shortness of breath. ) 1 Inhaler 5  . Biotin 5000 MCG CAPS Take 1 capsule by mouth daily.    . budesonide-formoterol (SYMBICORT) 160-4.5 MCG/ACT inhaler Inhale 2 puffs into the lungs 2 (two) times daily. 1 Inhaler 3  . Calcium Citrate-Vitamin D (CITRACAL + D PO) Take 1 tablet by mouth 2 (two) times daily.     . cetirizine-pseudoephedrine (ZYRTEC-D) 5-120 MG per tablet Take 1 tablet by mouth daily. 30 tablet 3  . dextromethorphan-guaiFENesin (MUCINEX DM) 30-600 MG per 12 hr tablet Take 1 tablet by mouth 2 (two) times daily. 10 tablet 0  . fluticasone (FLONASE) 50 MCG/ACT nasal spray     . LORazepam (ATIVAN) 0.5 MG tablet Take 1 tablet by mouth every 6 hours as needed 40 tablet 0  . Magnesium Hydroxide (PHILLIPS MILK OF MAGNESIA PO) Take 15 mL by mouth. Take daily at 6 o'clock at night    .  metoprolol tartrate (LOPRESSOR) 25 MG tablet Take 1 tablet (25 mg total) by mouth 2 (two) times daily. 60 tablet 8  . omeprazole (PRILOSEC) 20 MG capsule TAKE 2 CAPSULES BY MOUTH DAILY FOR REFLUX 60 capsule 5  . shark liver oil-cocoa butter (PREPARATION H) 0.25-3-85.5 % suppository Place 1 suppository rectally as needed.    . sodium chloride (OCEAN) 0.65 % SOLN nasal spray Place 1 spray into both nostrils as needed for congestion. 1 Bottle 1  . tiotropium (SPIRIVA) 18 MCG inhalation capsule Place 1 capsule (18 mcg total) into inhaler and inhale daily. 30 capsule 12  . TOVIAZ 8 MG TB24 tablet TAKE 1  TABLET BY MOUTH DAILY FOR URINE FREQUENCY 30 tablet 5  . vitamin E 400 UNIT capsule Take 400 Units by mouth daily.    . zoledronic acid (RECLAST) 5 MG/100ML SOLN injection Inject 100 mLs (5 mg total) into the vein once. 100 mL 0   No current facility-administered medications on file prior to visit.   Physical exam BP 136/78 mmHg  Pulse 54  Resp 10  Ht 5\' 3"  (1.6 m)  Wt 159 lb (72.122 kg)  BMI 28.17 kg/m2  SpO2 95%  General- elderly female in no acute distress, thin built Neck- no lymphadenopathy Cardiovascular- normal s1,s2, no murmurs/ rubs/ gallops Respiratory- bilateral poor air entry, no wheeze/ rhonchi or crackles Musculoskeletal- able to move all 4 extremities Psychiatry- alert and oriented to person, place and time, normal mood and affect  Assessment/plan  Chronic obstructive pulmonary disease, unspecified COPD, unspecified chronic bronchitis type Stable. Has symbicort ans spiriva, sample of spiriva provided. Reviewed her formulary book- no med alternative noted of lesser cost. Will provide her sample of medication when available

## 2014-01-09 DIAGNOSIS — N3946 Mixed incontinence: Secondary | ICD-10-CM | POA: Diagnosis not present

## 2014-01-09 DIAGNOSIS — N3944 Nocturnal enuresis: Secondary | ICD-10-CM | POA: Diagnosis not present

## 2014-01-10 ENCOUNTER — Encounter: Payer: Self-pay | Admitting: Cardiology

## 2014-01-10 ENCOUNTER — Ambulatory Visit (INDEPENDENT_AMBULATORY_CARE_PROVIDER_SITE_OTHER): Payer: Medicare Other | Admitting: Cardiology

## 2014-01-10 VITALS — BP 136/80 | HR 57 | Ht 63.0 in | Wt 160.0 lb

## 2014-01-10 DIAGNOSIS — Z954 Presence of other heart-valve replacement: Secondary | ICD-10-CM | POA: Diagnosis not present

## 2014-01-10 DIAGNOSIS — I350 Nonrheumatic aortic (valve) stenosis: Secondary | ICD-10-CM

## 2014-01-10 DIAGNOSIS — Z0389 Encounter for observation for other suspected diseases and conditions ruled out: Secondary | ICD-10-CM

## 2014-01-10 DIAGNOSIS — Z953 Presence of xenogenic heart valve: Secondary | ICD-10-CM

## 2014-01-10 DIAGNOSIS — I10 Essential (primary) hypertension: Secondary | ICD-10-CM

## 2014-01-10 DIAGNOSIS — IMO0001 Reserved for inherently not codable concepts without codable children: Secondary | ICD-10-CM

## 2014-01-10 DIAGNOSIS — I480 Paroxysmal atrial fibrillation: Secondary | ICD-10-CM

## 2014-01-10 NOTE — Patient Instructions (Signed)
Follow up with Dr.Berry in SIX months.

## 2014-01-10 NOTE — Progress Notes (Signed)
01/13/2014   PCP: [name], MD   Chief Complaint  Patient presents with  . Follow-up    6 months, denies chest pain, dyspnea, and swelling    Primary Cardiologist:Dr. Adora Fridge   HPI:  80 year old mildly overweight married Caucasian female who was last sen by Dr. Lenna Sciara. [name] 07/04/13.. She is a mother of 2, grandmother to 1 grandchild. Her risk factors include hypertension and family history. A brother died at age 36 from heart-related issues while he was being operated on. She has never had a heart attack or stroke. She did have moderate aortic stenosis by 2D echocardiogram last performed a year ago with a valve area of 0.83 cm2, peak gradient of 57, and mean of 35. She had negative Myoview on August 13, 2010.  She developed exertional jaw pain, which was fairly reproducible. Her last lipid profile a year ago was excellent with total cholesterol 166, LDL 84, HDL 44. 2-D echo was performed showed critical aortic stenosis the valve area 0.5 cm. Based on thisthe patient underwent right & left heart cardiac catheterization by Dr. Adora Fridge revealing normal coronaries and normal left function. She had critical aortic stenosis and ultimately underwent bioprosthetic aortic valve replacement by Dr. Roxy Manns on 08/30/12 with an Edwards magna ease pericardial tissue valve (21 mm) excellent result. Her postop course was complicated by nausea and paroxysmal atrial fibrillation for which she was treated with low dose amiodarone and Coumadin anticoagulation. She currently has had no further episodes of PAF. Her Coumadin and amiodarone discontinued. Her followup 2-D echocardiogram performed in August of last year revealed normal LV function with a well-functioning aortic bioprosthesis. She denies chest pain or shortness of breath.  Her Echo this year-  ECHO in August 2015: - Left ventricle: The cavity size was normal. There was mild concentric hypertrophy. Systolic function was normal.  The estimated ejection fraction was in the range of 55% to 60%. Wall motion was normal; there were no regional wall motion abnormalities. Doppler parameters are consistent with abnormal left ventricular relaxation (grade 1 diastolic dysfunction). - Aortic valve: A bioprosthesis was present and functioning normally. Valve area (VTI): 1.38 cm^2. Valve area (Vmax): 1.66 cm^2. - Left atrium: The atrium was mildly to moderately dilated. - Right atrium: The atrium was mildly dilated. - Atrial septum: No defect or patent foramen ovale was identified.  She has mild lightheadedness at times very brief.  No other complaints.  Allergies  Allergen Reactions  . Codeine Rash    All over the body.    Current Outpatient Prescriptions  Medication Sig Dispense Refill  . acetaminophen (TYLENOL) 325 MG tablet Take 2 tablets (650 mg total) by mouth every 6 (six) hours as needed.    Marland Kitchen albuterol (PROAIR HFA) 108 (90 BASE) MCG/ACT inhaler Inhale 1 puff into the lungs every 6 (six) hours as needed for wheezing or shortness of breath. (Patient taking differently: Inhale 2 puffs into the lungs every 6 (six) hours as needed for wheezing or shortness of breath. ) 1 Inhaler 5  . Ascorbic Acid (VITAMIN C) 1000 MG tablet Take 1,000 mg by mouth daily.    . Biotin 5000 MCG CAPS Take 1 capsule by mouth daily.    . budesonide-formoterol (SYMBICORT) 160-4.5 MCG/ACT inhaler Inhale 2 puffs into the lungs 2 (two) times daily. 1 Inhaler 3  . Calcium Citrate-Vitamin D (CITRACAL + D PO) Take 1 tablet by mouth 2 (two) times daily.     Marland Kitchen  cetirizine-pseudoephedrine (ZYRTEC-D) 5-120 MG per tablet Take 1 tablet by mouth daily. 30 tablet 3  . dextromethorphan-guaiFENesin (MUCINEX DM) 30-600 MG per 12 hr tablet Take 1 tablet by mouth 2 (two) times daily. 10 tablet 0  . fluticasone (FLONASE) 50 MCG/ACT nasal spray     . LORazepam (ATIVAN) 0.5 MG tablet Take 1 tablet by mouth every 6 hours as needed 40 tablet 0  . Magnesium  Hydroxide (PHILLIPS MILK OF MAGNESIA PO) Take 15 mL by mouth. Take daily at 6 o'clock at night    . metoprolol tartrate (LOPRESSOR) 25 MG tablet Take 1 tablet (25 mg total) by mouth 2 (two) times daily. 60 tablet 8  . mirabegron ER (MYRBETRIQ) 50 MG TB24 tablet Take 50 mg by mouth daily.    Marland Kitchen omeprazole (PRILOSEC) 20 MG capsule TAKE 2 CAPSULES BY MOUTH DAILY FOR REFLUX 60 capsule 5  . shark liver oil-cocoa butter (PREPARATION H) 0.25-3-85.5 % suppository Place 1 suppository rectally as needed.    . sodium chloride (OCEAN) 0.65 % SOLN nasal spray Place 1 spray into both nostrils as needed for congestion. 1 Bottle 1  . tiotropium (SPIRIVA) 18 MCG inhalation capsule Place 1 capsule (18 mcg total) into inhaler and inhale daily. 30 capsule 12  . vitamin E 400 UNIT capsule Take 400 Units by mouth daily.    . zoledronic acid (RECLAST) 5 MG/100ML SOLN injection Inject 100 mLs (5 mg total) into the vein once. 100 mL 0   No current facility-administered medications for this visit.    Past Medical History  Diagnosis Date  . Arthritis   . Hyperlipidemia   . Heart murmur   . Hearing loss   . Change in voice   . Osteoporosis   . Cancer     Breast  . Aortic valve disorder 08/13/2011    ECHO - EF >12%; mild diastolic dysfunction; calcified aortic valve, not well visualized; mod/severe aortic stenosis w/ worsening gradients when compared to 2012  . PVD (peripheral vascular disease) 08/13/2010    R/P MV - normal pattern of perfusion in all regions, EF 76%; no significant wall abnormalities noted; normal perfusion study  . Carotid bruit 08/06/2008    Doppler - R ECA demonstrates noarrowing w/ elevated velocities consistent w/ >70% diameter reduction; R and L ICAs show no evidence of diameter reduction, significant tortuosity or vascular abnormality;   . Aortic stenosis 07/20/2012    Aortic stenosis   . Hypertension     dr [name]  . S/P aortic valve replacement with bioprosthetic valve 08/30/2012    21 mm  [facility] Ease bovine pericardial tissue valve  . PAF (paroxysmal atrial fibrillation) 09/04/2012  . COPD (chronic obstructive pulmonary disease)     Past Surgical History  Procedure Laterality Date  . Breast lumpectomy  02/25/1997    right  . Mastectomy  09/29/95    left  . Total hip arthroplasty  09/2010  . Colon surgery  1959  . Abdominal hysterectomy      Partial  . Eye surgery  1997    Cataract surgery  . Biopsy shoulder Left 10/08/2005    shave biopsy  . Cosmetic surgery Left 1997    Breast implant  . Left heart cath  08/14/12    Nl cors, AS  . Cardiac catheterization    . Aortic valve replacement N/A 08/30/2012    Procedure: AORTIC VALVE REPLACEMENT (AVR);  Surgeon: Rexene Alberts, MD;  Location: [place];  Service: Open Heart Surgery;  Laterality:  N/A;  . Intraoperative transesophageal echocardiogram N/A 08/30/2012    Procedure: INTRAOPERATIVE TRANSESOPHAGEAL ECHOCARDIOGRAM;  Surgeon: Rexene Alberts, MD;  Location: [place];  Service: Open Heart Surgery;  Laterality: N/A;  . Partial hysterectomy      TKP:TWSFKCL:EX colds or fevers, no weight changes Skin:no rashes or ulcers HEENT:no blurred vision, no congestion CV:see HPI PUL:see HPI GI:no diarrhea constipation or melena, no indigestion GU:no hematuria, no dysuria MS:no joint pain, no claudication Neuro:no syncope, occ lightheadedness Endo:no diabetes, no thyroid disease  Wt Readings from Last 3 Encounters:  01/10/14 160 lb (72.576 kg)  01/01/14 159 lb (72.122 kg)  12/11/13 160 lb (72.576 kg)    PHYSICAL EXAM BP 136/80 mmHg  Pulse 57  Ht 5\' 3"  (1.6 m)  Wt 160 lb (72.576 kg)  BMI 28.35 kg/m2 General:Pleasant affect, NAD Skin:Warm and dry, brisk capillary refill HEENT:normocephalic, sclera clear, mucus membranes moist Neck:supple, no JVD, no bruits  Heart:S1S2 RRR with soft 1/6 systolic murmur, gallup, rub or click Lungs:clear without rales, rhonchi, or wheezes NTZ:GYFV, non tender, + BS, do not palpate liver  spleen or masses Ext:no lower ext edema, 2+ pedal pulses, 2+ radial pulses Neuro:alert and oriented, MAE, follows commands, + facial symmetry  EKG:SR lt atrial enlargement non specific ST abnormality  No changes from 06/2013  ASSESSMENT AND PLAN Aortic stenosis- critical AS at cath 08/14/12, with surgery Stable, no complaints.  No SOB. Most recent Echo in August, Aortic valve: A bioprosthesis was present and functioning normally. Valve area (VTI): 1.38 cm^2. Valve area (Vmax): 1.66 cm^2.  She will follow up with Dr. Adora Fridge in 6 months.  PAF (paroxysmal atrial fibrillation) No awareness of irregular HR.  HTN (hypertension) Stable.  Normal coronary arteries-6/14 No chest pain  S/P aortic valve replacement with bioprosthetic valve See above.

## 2014-01-13 NOTE — Assessment & Plan Note (Signed)
See above

## 2014-01-13 NOTE — Assessment & Plan Note (Signed)
Stable, no complaints.  No SOB. Most recent Echo in August, Aortic valve: A bioprosthesis was present and functioning normally. Valve area (VTI): 1.38 cm^2. Valve area (Vmax): 1.66 cm^2.  She will follow up with Dr. Adora Fridge in 6 months.

## 2014-01-13 NOTE — Assessment & Plan Note (Signed)
No awareness of irregular HR.

## 2014-01-13 NOTE — Assessment & Plan Note (Signed)
Stable

## 2014-01-13 NOTE — Assessment & Plan Note (Signed)
No chest pain

## 2014-02-07 ENCOUNTER — Encounter (HOSPITAL_COMMUNITY): Payer: Self-pay | Admitting: Cardiovascular Disease

## 2014-03-08 DIAGNOSIS — J385 Laryngeal spasm: Secondary | ICD-10-CM | POA: Diagnosis not present

## 2014-03-08 DIAGNOSIS — R1310 Dysphagia, unspecified: Secondary | ICD-10-CM | POA: Diagnosis not present

## 2014-04-08 DIAGNOSIS — H26493 Other secondary cataract, bilateral: Secondary | ICD-10-CM | POA: Diagnosis not present

## 2014-04-16 ENCOUNTER — Ambulatory Visit: Payer: Medicare Other | Admitting: Internal Medicine

## 2014-05-21 ENCOUNTER — Ambulatory Visit (INDEPENDENT_AMBULATORY_CARE_PROVIDER_SITE_OTHER): Payer: Medicare Other | Admitting: Internal Medicine

## 2014-05-21 ENCOUNTER — Encounter: Payer: Self-pay | Admitting: Internal Medicine

## 2014-05-21 VITALS — BP 122/70 | HR 72 | Temp 97.8°F | Wt 156.0 lb

## 2014-05-21 DIAGNOSIS — N3281 Overactive bladder: Secondary | ICD-10-CM

## 2014-05-21 DIAGNOSIS — K219 Gastro-esophageal reflux disease without esophagitis: Secondary | ICD-10-CM

## 2014-05-21 DIAGNOSIS — Z23 Encounter for immunization: Secondary | ICD-10-CM | POA: Diagnosis not present

## 2014-05-21 DIAGNOSIS — I10 Essential (primary) hypertension: Secondary | ICD-10-CM | POA: Diagnosis not present

## 2014-05-21 DIAGNOSIS — G4700 Insomnia, unspecified: Secondary | ICD-10-CM | POA: Diagnosis not present

## 2014-05-21 DIAGNOSIS — M810 Age-related osteoporosis without current pathological fracture: Secondary | ICD-10-CM

## 2014-05-21 MED ORDER — TETANUS-DIPHTH-ACELL PERTUSSIS 5-2-15.5 LF-MCG/0.5 IM SUSP: 0.5000 mL | Freq: Once | INTRAMUSCULAR | Status: DC

## 2014-05-21 NOTE — Progress Notes (Signed)
Patient ID: [name], female   DOB: Sep 09, 1931, 81 y.o.   MRN: [id number]    Chief Complaint  Patient presents with  . Medical Management of Chronic Issues    4 Month Follow up  . Allergies  Allergen Reactions  . Codeine Rash    All over the body.   HPI 81 y/o female patient is seen today for routine follow up. She has copd, HTN, osteoporosis, AS s/p surgery, afib, copd, gerd Her breathing is stable  Denies any concerns this visit Due for prevnar and tdap  Review of Systems  Constitutional: Negative for fever, chills, diaphoresis.  HENT: Negative for congestion, hearing loss and sore throat.   Eyes: Negative for blurred vision, double vision and discharge.  Respiratory: Negative for cough, sputum production, shortness of breath and wheezing.   Cardiovascular: Negative for chest pain, palpitations, orthopnea and leg swelling.  Gastrointestinal: Negative for heartburn, nausea, vomiting, abdominal pain, diarrhea and constipation.  Genitourinary: Negative for dysuria Musculoskeletal: Negative for back pain, falls Skin: Negative for itching and rash.  Neurological: Negative for dizziness, tingling, focal weakness and headaches.  Psychiatric/Behavioral: Negative for depression.    Past Medical History  Diagnosis Date  . Arthritis   . Hyperlipidemia   . Heart murmur   . Hearing loss   . Change in voice   . Osteoporosis   . Cancer     Breast  . Aortic valve disorder 08/13/2011    ECHO - EF >10%; mild diastolic dysfunction; calcified aortic valve, not well visualized; mod/severe aortic stenosis w/ worsening gradients when compared to 2012  . PVD (peripheral vascular disease) 08/13/2010    R/P MV - normal pattern of perfusion in all regions, EF 76%; no significant wall abnormalities noted; normal perfusion study  . Carotid bruit 08/06/2008    Doppler - R ECA demonstrates noarrowing w/ elevated velocities consistent w/ >70% diameter reduction; R and L ICAs show no evidence of  diameter reduction, significant tortuosity or vascular abnormality;   . Aortic stenosis 07/20/2012    Aortic stenosis   . Hypertension     dr Gwenlyn Found  . S/P aortic valve replacement with bioprosthetic valve 08/30/2012    21 mm [facility] Ease bovine pericardial tissue valve  . PAF (paroxysmal atrial fibrillation) 09/04/2012  . COPD (chronic obstructive pulmonary disease)    Current Outpatient Prescriptions on File Prior to Visit  Medication Sig Dispense Refill  . acetaminophen (TYLENOL) 325 MG tablet Take 2 tablets (650 mg total) by mouth every 6 (six) hours as needed.    Marland Kitchen albuterol (PROAIR HFA) 108 (90 BASE) MCG/ACT inhaler Inhale 1 puff into the lungs every 6 (six) hours as needed for wheezing or shortness of breath. (Patient taking differently: Inhale 2 puffs into the lungs every 6 (six) hours as needed for wheezing or shortness of breath. ) 1 Inhaler 5  . Ascorbic Acid (VITAMIN C) 1000 MG tablet Take 1,000 mg by mouth daily.    . Biotin 5000 MCG CAPS Take 1 capsule by mouth daily.    . budesonide-formoterol (SYMBICORT) 160-4.5 MCG/ACT inhaler Inhale 2 puffs into the lungs 2 (two) times daily. 1 Inhaler 3  . Calcium Citrate-Vitamin D (CITRACAL + D PO) Take 1 tablet by mouth 2 (two) times daily.     . cetirizine-pseudoephedrine (ZYRTEC-D) 5-120 MG per tablet Take 1 tablet by mouth daily. 30 tablet 3  . dextromethorphan-guaiFENesin (MUCINEX DM) 30-600 MG per 12 hr tablet Take 1 tablet by mouth 2 (two) times daily. 10  tablet 0  . fluticasone (FLONASE) 50 MCG/ACT nasal spray     . LORazepam (ATIVAN) 0.5 MG tablet Take 1 tablet by mouth every 6 hours as needed 40 tablet 0  . Magnesium Hydroxide (PHILLIPS MILK OF MAGNESIA PO) Take 15 mL by mouth. Take daily at 6 o'clock at night    . metoprolol tartrate (LOPRESSOR) 25 MG tablet Take 1 tablet (25 mg total) by mouth 2 (two) times daily. 60 tablet 8  . mirabegron ER (MYRBETRIQ) 50 MG TB24 tablet Take 50 mg by mouth daily.    Marland Kitchen omeprazole (PRILOSEC)  20 MG capsule TAKE 2 CAPSULES BY MOUTH DAILY FOR REFLUX 60 capsule 5  . shark liver oil-cocoa butter (PREPARATION H) 0.25-3-85.5 % suppository Place 1 suppository rectally as needed.    . sodium chloride (OCEAN) 0.65 % SOLN nasal spray Place 1 spray into both nostrils as needed for congestion. 1 Bottle 1  . tiotropium (SPIRIVA) 18 MCG inhalation capsule Place 1 capsule (18 mcg total) into inhaler and inhale daily. 30 capsule 12  . vitamin E 400 UNIT capsule Take 400 Units by mouth daily.    . zoledronic acid (RECLAST) 5 MG/100ML SOLN injection Inject 100 mLs (5 mg total) into the vein once. 100 mL 0   No current facility-administered medications on file prior to visit.    Physical exam BP 122/70 mmHg  Pulse 72  Temp(Src) 97.8 F (36.6 C) (Oral)  Wt 156 lb (70.761 kg)   Wt Readings from Last 3 Encounters:  05/21/14 156 lb (70.761 kg)  01/10/14 160 lb (72.576 kg)  01/01/14 159 lb (72.122 kg)   General- elderly female in no acute distress, thin built Head- atraumatic, normocephalic, no sinus tenderness Eyes- PERRLA, EOMI, no pallor, no icterus, no discharge Neck- no lymphadenopathy, no thyromegaly Cardiovascular- normal s1,s2, no murmurs Respiratory- bilateral poor air entry, no wheeze/ rhonchi or crackles Abdomen- bowel sounds present, soft, non tender Musculoskeletal- able to move all 4 extremities Psychiatry- alert and oriented to person, place and time, normal mood and affect  Assessment/plan  1. Need for vaccination with 13-polyvalent pneumococcal conjugate vaccine - Pneumococcal conjugate vaccine 13-valent  2. Gastroesophageal reflux disease without esophagitis Controlled symptom,on prilosec 20 mg daily  3. Essential hypertension Stable, continue lopressor 25 mg bid - CMP; Future - CBC with Differential; Future - Lipid Panel; Future  4. Overactive bladder Stable, continue myrbetriq  5. Osteoporosis Continue yearly zolendronic acid with citracal + vit d  supplement  6. Insomnia Stable with melatonin

## 2014-05-22 ENCOUNTER — Other Ambulatory Visit: Payer: Medicare Other

## 2014-05-22 DIAGNOSIS — I10 Essential (primary) hypertension: Secondary | ICD-10-CM

## 2014-05-23 LAB — COMPREHENSIVE METABOLIC PANEL
ALT: 11 IU/L (ref 0–32)
AST: 18 IU/L (ref 0–40)
Albumin/Globulin Ratio: 2.4 (ref 1.1–2.5)
Albumin: 4.3 g/dL (ref 3.5–4.7)
Alkaline Phosphatase: 63 IU/L (ref 39–117)
BUN/Creatinine Ratio: 13 (ref 11–26)
BUN: 15 mg/dL (ref 8–27)
Bilirubin Total: 0.5 mg/dL (ref 0.0–1.2)
CO2: 25 mmol/L (ref 18–29)
Calcium: 10 mg/dL (ref 8.7–10.3)
Chloride: 102 mmol/L (ref 97–108)
Creatinine, Ser: 1.15 mg/dL — ABNORMAL HIGH (ref 0.57–1.00)
GFR calc Af Amer: 52 mL/min/{1.73_m2} — ABNORMAL LOW (ref >59)
GFR calc non Af Amer: 45 mL/min/{1.73_m2} — ABNORMAL LOW (ref >59)
Globulin, Total: 1.8 g/dL (ref 1.5–4.5)
Glucose: 90 mg/dL (ref 65–99)
Potassium: 4 mmol/L (ref 3.5–5.2)
Sodium: 142 mmol/L (ref 134–144)
Total Protein: 6.1 g/dL (ref 6.0–8.5)

## 2014-05-23 LAB — CBC WITH DIFFERENTIAL/PLATELET
Basophils Absolute: 0.1 10*3/uL (ref 0.0–0.2)
Basos: 1 %
Eos: 3 %
Eosinophils Absolute: 0.2 10*3/uL (ref 0.0–0.4)
HCT: 37.4 % (ref 34.0–46.6)
Hemoglobin: 12.6 g/dL (ref 11.1–15.9)
Immature Grans (Abs): 0 10*3/uL (ref 0.0–0.1)
Immature Granulocytes: 0 %
Lymphocytes Absolute: 2.3 10*3/uL (ref 0.7–3.1)
Lymphs: 33 %
MCH: 31.7 pg (ref 26.6–33.0)
MCHC: 33.7 g/dL (ref 31.5–35.7)
MCV: 94 fL (ref 79–97)
Monocytes Absolute: 0.6 10*3/uL (ref 0.1–0.9)
Monocytes: 9 %
Neutrophils Absolute: 3.8 10*3/uL (ref 1.4–7.0)
Neutrophils Relative %: 54 %
Platelets: 218 10*3/uL (ref 150–379)
RBC: 3.98 x10E6/uL (ref 3.77–5.28)
RDW: 14.1 % (ref 12.3–15.4)
WBC: 6.9 10*3/uL (ref 3.4–10.8)

## 2014-05-23 LAB — LIPID PANEL
Chol/HDL Ratio: 2.5 ratio units (ref 0.0–4.4)
Cholesterol, Total: 144 mg/dL (ref 100–199)
HDL: 57 mg/dL (ref >39)
LDL Calculated: 70 mg/dL (ref 0–99)
Triglycerides: 83 mg/dL (ref 0–149)
VLDL Cholesterol Cal: 17 mg/dL (ref 5–40)

## 2014-05-29 ENCOUNTER — Other Ambulatory Visit: Payer: Self-pay | Admitting: *Deleted

## 2014-05-29 DIAGNOSIS — I10 Essential (primary) hypertension: Secondary | ICD-10-CM

## 2014-06-04 ENCOUNTER — Ambulatory Visit
Admission: RE | Admit: 2014-06-04 | Discharge: 2014-06-04 | Disposition: A | Discharge status: Home / Self Care | Payer: Medicare Other | Source: Ambulatory Visit | Attending: Internal Medicine | Admitting: Internal Medicine

## 2014-06-04 DIAGNOSIS — I10 Essential (primary) hypertension: Secondary | ICD-10-CM

## 2014-06-06 ENCOUNTER — Other Ambulatory Visit (HOSPITAL_BASED_OUTPATIENT_CLINIC_OR_DEPARTMENT_OTHER): Payer: Self-pay | Admitting: Internal Medicine

## 2014-06-10 ENCOUNTER — Telehealth: Payer: Self-pay | Admitting: Cardiovascular Disease

## 2014-06-10 NOTE — Telephone Encounter (Signed)
Spoke with pt, she was told by her PCP that the metoprolol had caused damage to her kidney's. According to the patient her creatine is elevated due to the metoprolol. They did a renal US that was normal. She has a follow up appt with the PCP 07-03-14 and a f/u with dr berry 07-17-14. She wants dr berry to know what is going on, he prescribed the metoprolol. Will forward to dr berry

## 2014-06-10 NOTE — Telephone Encounter (Signed)
Pt said she just found out from her medical doctor,that her blood pressure is causing damage to her kidney.

## 2014-06-11 NOTE — Telephone Encounter (Signed)
Never heard of metoprolol causing renal dysfunction.  However she did have a Reclast injection at some time and that can cause kidney problems.

## 2014-06-11 NOTE — Telephone Encounter (Signed)
Metop does NOT cause renal dysfunction

## 2014-06-11 NOTE — Telephone Encounter (Signed)
Erasmo Downer, have you ever heard of this?

## 2014-06-12 ENCOUNTER — Telehealth: Payer: Self-pay | Admitting: *Deleted

## 2014-06-12 NOTE — Telephone Encounter (Signed)
Patient called and wanted to know if she should still take the Reclast Injection with her Kidney Function being abnormal. Her Cardiologist told her that the Reclast could make kidney functions abnormal, so she is concerned with taking it. Please Advise.

## 2014-06-12 NOTE — Telephone Encounter (Signed)
Patient notifed

## 2014-06-13 NOTE — Telephone Encounter (Signed)
Reviewed her labs and medication list. She needs reclast once every 2 years only as it is for prevention of bone loss. She did not want to take the bone strengthening pills as it made her sick in her stomach. That is why we decided on the once a year medication. With her current kidney function. It is ok to take reclast. Last reclast was in 2015. So, she is due for next one in 2 years after the last dosing. Please verify on this and schedule accordingly. It will be important to keep your blood pressure under control- so take your prescribed medications only.

## 2014-06-14 NOTE — Telephone Encounter (Signed)
Discussed with patient, patient verbalized understanding of Dr.Pandey's response. Patient states she is no longer interested in reclast and will not get any future reclast infusions. Cancelled appointment to recheck Kidney function on 06/24/14 (not needed since patient will no longer get reclast infusion)

## 2014-06-24 ENCOUNTER — Other Ambulatory Visit: Payer: Self-pay

## 2014-06-27 ENCOUNTER — Other Ambulatory Visit: Payer: Self-pay

## 2014-06-27 ENCOUNTER — Encounter: Payer: Self-pay | Admitting: Nurse Practitioner

## 2014-06-27 ENCOUNTER — Ambulatory Visit (INDEPENDENT_AMBULATORY_CARE_PROVIDER_SITE_OTHER): Payer: Medicare Other | Admitting: Nurse Practitioner

## 2014-06-27 VITALS — BP 128/52 | HR 67 | Temp 97.9°F | Resp 20 | Ht 63.0 in | Wt 160.2 lb

## 2014-06-27 DIAGNOSIS — N183 Chronic kidney disease, stage 3 unspecified: Secondary | ICD-10-CM

## 2014-06-27 DIAGNOSIS — J302 Other seasonal allergic rhinitis: Secondary | ICD-10-CM | POA: Diagnosis not present

## 2014-06-27 MED ORDER — LORATADINE 10 MG PO TABS: 10.0000 mg | ORAL_TABLET | Freq: Every day | ORAL | Status: DC

## 2014-06-27 MED ORDER — FLUTICASONE PROPIONATE 50 MCG/ACT NA SUSP: NASAL | Status: DC

## 2014-06-27 MED ORDER — FLUTICASONE PROPIONATE 50 MCG/ACT NA SUSP: 1.0000 | Freq: Two times a day (BID) | NASAL | Status: DC

## 2014-06-27 NOTE — Addendum Note (Signed)
Addended by: Lauree Chandler on: 06/27/2014 09:44 PM   Modules accepted: Orders, Medications

## 2014-06-27 NOTE — Patient Instructions (Signed)
May use netti pot twice daily to help with congestion -- this is over the counter  PLAIN loratadine (generic Claritin) 10 mg daily for allergies  Use Flonase twice daily routinely Use saline spray as needed throughout the day  May use Mucinex DM with full glass of water twice daily as needed for cough and congestion

## 2014-06-27 NOTE — Progress Notes (Signed)
Patient ID: [name], female   DOB: 11-15-1931, 81 y.o.   MRN: [id number]    PCP: [name], MD  Allergies  Allergen Reactions  . Codeine Rash    All over the body.    Chief Complaint  Patient presents with  . Acute Visit    kidney function concerns,Patient thinks she might have Aa sinus infection     HPI: Patient is a 81 y.o. female seen in the office today for feeling bad for 4 days. Nasal congestion and running nose, eyes itching and burning, fullness in ears.and throat hurts. Dry cough-- has been taking allegra D for congestion. Does not take Flonase regularly.   Concerned over kidney function. Reports she feels like she is drinking enough water, not taking NSAIDs  Review of Systems:  Review of Systems  Constitutional: Negative for chills, activity change, appetite change, fatigue and unexpected weight change.  HENT: Positive for congestion, hearing loss, postnasal drip, rhinorrhea, sinus pressure and sneezing. Negative for drooling, ear discharge, ear pain and facial swelling.   Respiratory: Negative for shortness of breath.   Cardiovascular: Negative for chest pain and palpitations.  Allergic/Immunologic: Positive for environmental allergies.  Neurological: Positive for headaches (frontal ).    Past Medical History  Diagnosis Date  . Arthritis   . Hyperlipidemia   . Heart murmur   . Hearing loss   . Change in voice   . Osteoporosis   . Cancer     Breast  . Aortic valve disorder 08/13/2011    ECHO - EF >24%; mild diastolic dysfunction; calcified aortic valve, not well visualized; mod/severe aortic stenosis w/ worsening gradients when compared to 2012  . PVD (peripheral vascular disease) 08/13/2010    R/P MV - normal pattern of perfusion in all regions, EF 76%; no significant wall abnormalities noted; normal perfusion study  . Carotid bruit 08/06/2008    Doppler - R ECA demonstrates noarrowing w/ elevated velocities consistent w/ >70% diameter reduction; R  and L ICAs show no evidence of diameter reduction, significant tortuosity or vascular abnormality;   . Aortic stenosis 07/20/2012    Aortic stenosis   . Hypertension     dr Gwenlyn Found  . S/P aortic valve replacement with bioprosthetic valve 08/30/2012    21 mm [facility] Ease bovine pericardial tissue valve  . PAF (paroxysmal atrial fibrillation) 09/04/2012  . COPD (chronic obstructive pulmonary disease)    Past Surgical History  Procedure Laterality Date  . Breast lumpectomy  02/25/1997    right  . Mastectomy  09/29/95    left  . Total hip arthroplasty  09/2010  . Colon surgery  1959  . Abdominal hysterectomy      Partial  . Eye surgery  1997    Cataract surgery  . Biopsy shoulder Left 10/08/2005    shave biopsy  . Cosmetic surgery Left 1997    Breast implant  . Left heart cath  08/14/12    Nl cors, AS  . Cardiac catheterization    . Aortic valve replacement N/A 08/30/2012    Procedure: AORTIC VALVE REPLACEMENT (AVR);  Surgeon: Rexene Alberts, MD;  Location: [place];  Service: Open Heart Surgery;  Laterality: N/A;  . Intraoperative transesophageal echocardiogram N/A 08/30/2012    Procedure: INTRAOPERATIVE TRANSESOPHAGEAL ECHOCARDIOGRAM;  Surgeon: Rexene Alberts, MD;  Location: [place];  Service: Open Heart Surgery;  Laterality: N/A;  . Partial hysterectomy    . Left and right heart catheterization with coronary angiogram N/A 08/14/2012  Procedure: LEFT AND RIGHT HEART CATHETERIZATION WITH CORONARY ANGIOGRAM;  Surgeon: Lorretta Harp, MD;  Location: [facility] CATH LAB;  Service: Cardiovascular;  Laterality: N/A;   Social History:   reports that she has never smoked. She has never used smokeless tobacco. She reports that she does not drink alcohol or use illicit drugs.  Family History  Problem Relation Age of Onset  . Cancer Mother     Bladder  . Early death Father     Tractor accident  . Early death Brother     Radiation protection practitioner  . Early death Brother     during heart surgery     Medications: Patient's Medications  New Prescriptions   No medications on file  Previous Medications   ACETAMINOPHEN (TYLENOL) 325 MG TABLET    Take 2 tablets (650 mg total) by mouth every 6 (six) hours as needed.   ALBUTEROL (PROAIR HFA) 108 (90 BASE) MCG/ACT INHALER    Inhale 1 puff into the lungs every 6 (six) hours as needed for wheezing or shortness of breath.   ASCORBIC ACID (VITAMIN C) 1000 MG TABLET    Take 1,000 mg by mouth daily.   BIOTIN 5000 MCG CAPS    Take 1 capsule by mouth daily.   BUDESONIDE-FORMOTEROL (SYMBICORT) 160-4.5 MCG/ACT INHALER    Inhale 2 puffs into the lungs 2 (two) times daily.   CALCIUM CITRATE-VITAMIN D (CITRACAL + D PO)    Take 1 tablet by mouth 2 (two) times daily.    CETIRIZINE-PSEUDOEPHEDRINE (ZYRTEC-D) 5-120 MG PER TABLET    Take 1 tablet by mouth daily.   DEXTROMETHORPHAN-GUAIFENESIN (MUCINEX DM) 30-600 MG PER 12 HR TABLET    Take 1 tablet by mouth 2 (two) times daily.   FLUTICASONE (FLONASE) 50 MCG/ACT NASAL SPRAY       LORAZEPAM (ATIVAN) 0.5 MG TABLET    Take 1 tablet by mouth every 6 hours as needed   MAGNESIUM HYDROXIDE (PHILLIPS MILK OF MAGNESIA PO)    Take 15 mL by mouth. Take daily at 6 o'clock at night   MELATONIN 5 MG TABS    Take by mouth. Take one tablet by mouth at bedtime as needed for rest   METOPROLOL TARTRATE (LOPRESSOR) 25 MG TABLET    Take 1 tablet (25 mg total) by mouth 2 (two) times daily.   MIRABEGRON ER (MYRBETRIQ) 50 MG TB24 TABLET    Take 50 mg by mouth daily.   OMEPRAZOLE (PRILOSEC) 20 MG CAPSULE    TAKE 2 CAPS BY MOUTH DAILY FOR REFLUX   SHARK LIVER OIL-COCOA BUTTER (PREPARATION H) 0.25-3-85.5 % SUPPOSITORY    Place 1 suppository rectally as needed.   SODIUM CHLORIDE (OCEAN) 0.65 % SOLN NASAL SPRAY    Place 1 spray into both nostrils as needed for congestion.   TDAP (ADACEL) 06-30-13.5 LF-MCG/0.5 INJECTION    Inject 0.5 mLs into the muscle once.   TIOTROPIUM (SPIRIVA) 18 MCG INHALATION CAPSULE    Place 1 capsule (18 mcg total)  into inhaler and inhale daily.   VITAMIN E 400 UNIT CAPSULE    Take 400 Units by mouth daily.   ZOLEDRONIC ACID (RECLAST) 5 MG/100ML SOLN INJECTION    Inject 100 mLs (5 mg total) into the vein once.  Modified Medications   No medications on file  Discontinued Medications   No medications on file     Physical Exam:  Filed Vitals:   06/27/14 1253  BP: 128/52  Pulse: 67  Temp: 97.9 F (36.6 C)  TempSrc: Oral  Resp: 20  Height: 5\' 3"  (1.6 m)  Weight: 160 lb 3.2 oz (72.666 kg)  SpO2: 99%    Physical Exam  Constitutional: She appears well-developed and well-nourished. No distress.  HENT:  Head: Normocephalic and atraumatic.  Right Ear: External ear normal.  Left Ear: External ear normal.  Nose: Nose normal.  Mouth/Throat: Oropharynx is clear and moist. No oropharyngeal exudate.  Eyes: Conjunctivae and EOM are normal. Pupils are equal, round, and reactive to light.  Neck: Normal range of motion. Neck supple.  Cardiovascular: Normal rate, regular rhythm and normal heart sounds.   Pulmonary/Chest: Effort normal and breath sounds normal.  Musculoskeletal: She exhibits no edema or tenderness.  Lymphadenopathy:    She has no cervical adenopathy.  Neurological: She is alert.  Skin: Skin is warm and dry. She is not diaphoretic.    Labs reviewed: Basic Metabolic Panel:  Recent Labs  05/22/14 0842  NA 142  K 4.0  CL 102  CO2 25  GLUCOSE 90  BUN 15  CREATININE 1.15*  CALCIUM 10.0   Liver Function Tests:  Recent Labs  05/22/14 0842  AST 18  ALT 11  ALKPHOS 63  BILITOT 0.5  PROT 6.1   No results for input(s): LIPASE, AMYLASE in the last 8760 hours. No results for input(s): AMMONIA in the last 8760 hours. CBC:  Recent Labs  05/22/14 0842  WBC 6.9  NEUTROABS 3.8  HGB 12.6  HCT 37.4  MCV 94  PLT 218   Lipid Panel:  Recent Labs  05/22/14 0842  CHOL 144  HDL 57  LDLCALC 70  TRIG 83  CHOLHDL 2.5   TSH: No results for input(s): TSH in the last  8760 hours. A1C: Lab Results  Component Value Date   HGBA1C 5.5 08/28/2012     Assessment/Plan  1. Other seasonal allergic rhinitis -stop medication with "D", advised against medications with phenylephrine or pseudoephedrine due to side effects including increase in blood pressure.  - may take Claritin daily -to take Flonase 1 spray into each nare BID -netti pot daily - plain nasal saline as needed  2. CKD (chronic kidney disease) stage 3, GFR 30-59 ml/min -discussed kidney disease, measures to take to prevent worsening of diease, proper bp control, avoid NSAIDs and other nephrotoxic medication and keeping hydrated. - no further intervention needed at this time, will monitor 30 mins Time spent with pt and daughter:  time greater than 50% of total time spent doing education and plan of care.   To keep follow up, with Dr Eulas Post, follow up sooner if allergic rhinitis worsens, fever or malaise occurs.

## 2014-07-03 ENCOUNTER — Ambulatory Visit: Payer: Self-pay | Admitting: Internal Medicine

## 2014-07-05 IMAGING — CR DG CHEST 2V
2 series · 2 of 2 positions shown · non-contrast
Comparison: May 16, 2013.

CLINICAL DATA: Chest congestion.

EXAM:
CHEST  2 VIEW

[w chest pa]
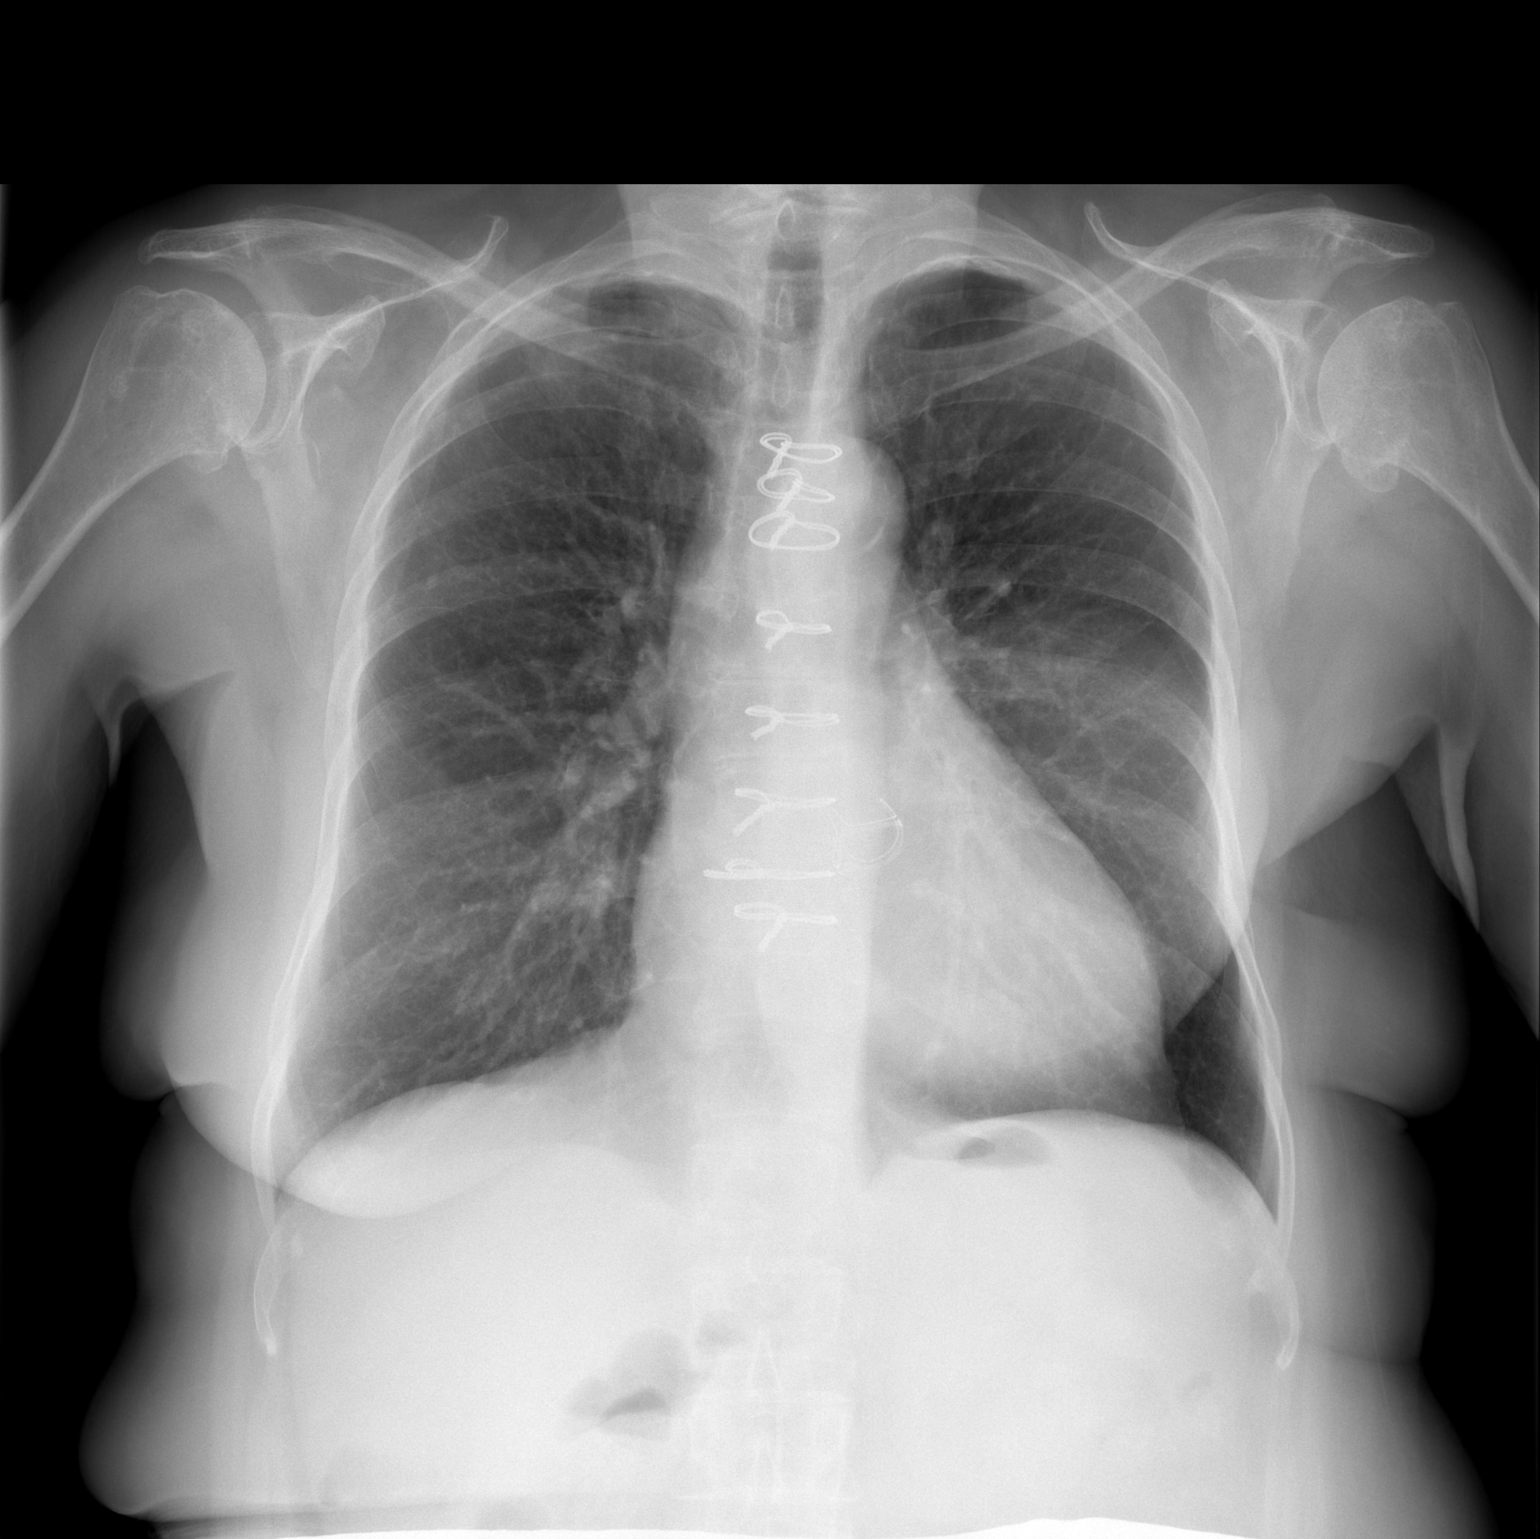

[w chest lat]
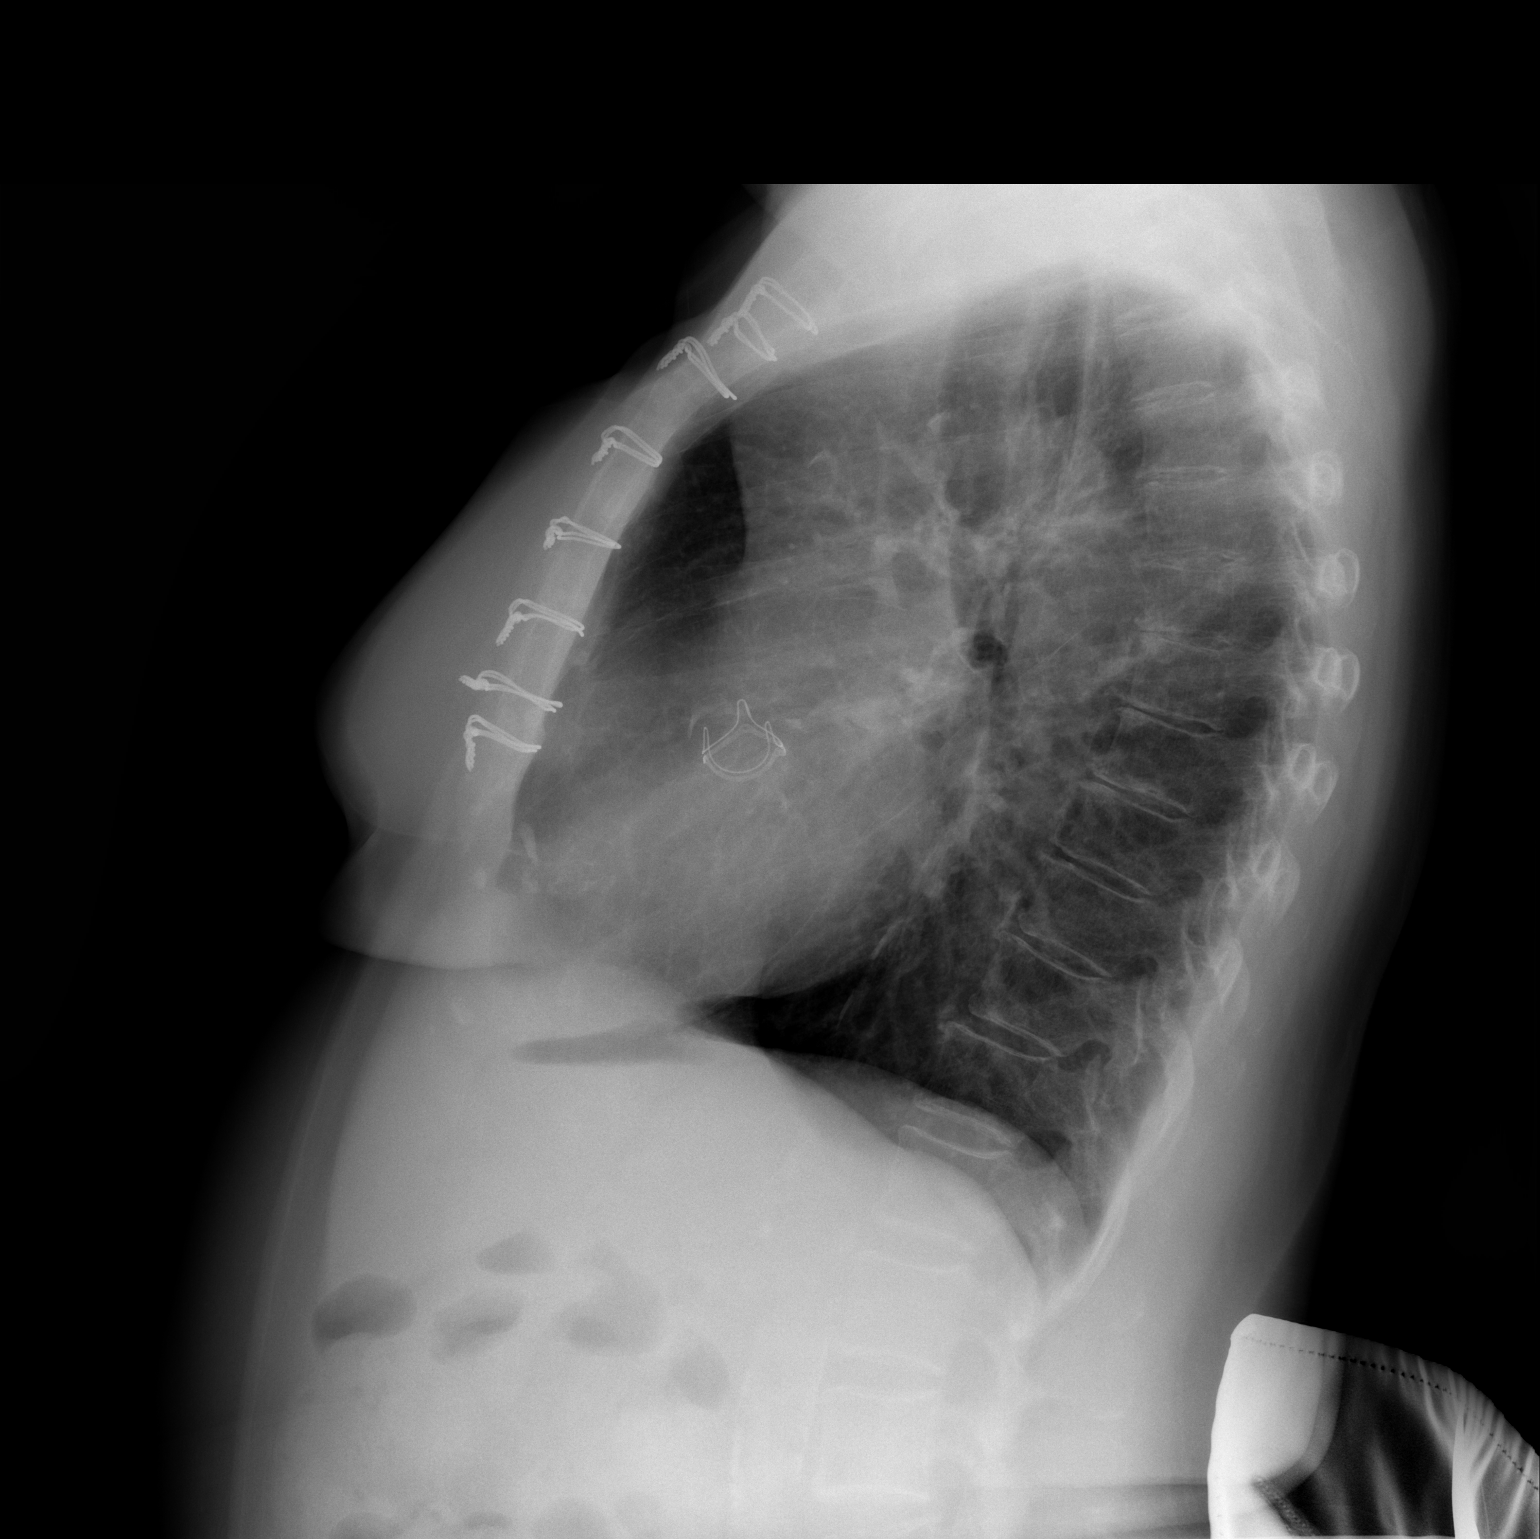

[2 of 2 positions shown; findings below may reference images not displayed]

FINDINGS: Stable cardiomediastinal silhouette. Status post cardiac valve
replacement. No pneumothorax or pleural effusion is noted. No acute
pulmonary disease is noted. Mild hyperexpansion of the lungs is
noted. Mild degenerative changes seen involving the glenohumeral
joints bilaterally.
IMPRESSION: No acute cardiopulmonary abnormality seen.

## 2014-07-11 DIAGNOSIS — R350 Frequency of micturition: Secondary | ICD-10-CM | POA: Diagnosis not present

## 2014-07-11 DIAGNOSIS — R351 Nocturia: Secondary | ICD-10-CM | POA: Diagnosis not present

## 2014-07-11 DIAGNOSIS — N3944 Nocturnal enuresis: Secondary | ICD-10-CM | POA: Diagnosis not present

## 2014-07-11 DIAGNOSIS — N3946 Mixed incontinence: Secondary | ICD-10-CM | POA: Diagnosis not present

## 2014-07-17 ENCOUNTER — Ambulatory Visit: Payer: 59 | Admitting: Cardiovascular Disease

## 2014-07-17 ENCOUNTER — Ambulatory Visit (INDEPENDENT_AMBULATORY_CARE_PROVIDER_SITE_OTHER): Payer: Medicare Other | Admitting: Cardiovascular Disease

## 2014-07-17 ENCOUNTER — Encounter: Payer: Self-pay | Admitting: Cardiovascular Disease

## 2014-07-17 VITALS — BP 106/78 | HR 66 | Ht 63.0 in | Wt 160.0 lb

## 2014-07-17 DIAGNOSIS — R0600 Dyspnea, unspecified: Secondary | ICD-10-CM

## 2014-07-17 DIAGNOSIS — I10 Essential (primary) hypertension: Secondary | ICD-10-CM | POA: Diagnosis not present

## 2014-07-17 DIAGNOSIS — R0989 Other specified symptoms and signs involving the circulatory and respiratory systems: Secondary | ICD-10-CM

## 2014-07-17 DIAGNOSIS — Z954 Presence of other heart-valve replacement: Secondary | ICD-10-CM | POA: Diagnosis not present

## 2014-07-17 DIAGNOSIS — Z952 Presence of prosthetic heart valve: Secondary | ICD-10-CM

## 2014-07-17 NOTE — Patient Instructions (Signed)
  We will see you back in follow up in 1 year with Dr Gwenlyn Found.   Dr Gwenlyn Found has ordered: 1. Carotid Duplex- This test is an ultrasound of the carotid arteries in your neck. It looks at blood flow through these arteries that supply the brain with blood. Allow one hour for this exam. There are no restrictions or special instructions.  2.  Echocardiogram (to be done in August). Echocardiography is a painless test that uses sound waves to create images of your heart. It provides your doctor with information about the size and shape of your heart and how well your heart's chambers and valves are working. This procedure takes approximately one hour. There are no restrictions for this procedure.

## 2014-07-17 NOTE — Progress Notes (Signed)
07/17/2014 [name]   10/13/1931  [id number]  Primary Physician Gildardo Cranker, DO Primary Cardiologist: Lorretta Harp MD Renae Gloss   HPI:  The patient is a 81 year old mildly overweight married Caucasian female who I last saw 10/02/12.. She is a mother of 2, grandmother to 1 grandchild. I saw her a year ago . Her risk factors include hypertension and family history. A brother died at age 75 from heart-related issues while he was being operated on. She has never had a heart attack or stroke. She does have moderate aortic stenosis by 2D echocardiogram last performed a year ago with a valve area of 0.83 cm2, peak gradient of 57, and mean of 35. She had negative Myoview on August 13, 2010. Since I saw her last, she developed exertional jaw pain, which was fairly reproducible. Her last lipid profile a year ago was excellent with total cholesterol 166, LDL 89, HDL 44. 2-D echo was performed showed critical aortic stenosis the valve area 0.5 cm. Based on thisthe patient underwent right left heart cardiac catheterization by myself revealing normal coronaries and normal left function. She had critical aortic stenosis and ultimately underwent bioprosthetic aortic valve replacement by Dr. Roxy Manns on 08/30/12 with an Edwards magna ease pericardial tissue valve (21 mm) excellent result. Her postop course was complicated by nausea and paroxysmal atrial fibrillation for which she was treated with low dose amiodarone and Coumadin anticoagulation. She currently has had no further episodes of PAF. Her Coumadin and amiodarone discontinued. Her followup 2-D echocardiogram performed in August of last year revealed normal LV function with a well-functioning aortic bioprosthesis. She denies chest pain or shortness of breath.   Current Outpatient Prescriptions  Medication Sig Dispense Refill  . acetaminophen (TYLENOL) 325 MG tablet Take 2 tablets (650 mg total) by mouth every 6 (six) hours as needed.      Marland Kitchen albuterol (PROAIR HFA) 108 (90 BASE) MCG/ACT inhaler Inhale 1 puff into the lungs every 6 (six) hours as needed for wheezing or shortness of breath. (Patient taking differently: Inhale 2 puffs into the lungs every 6 (six) hours as needed for wheezing or shortness of breath. ) 1 Inhaler 5  . Biotin 5000 MCG CAPS Take 1 capsule by mouth daily.    . budesonide-formoterol (SYMBICORT) 160-4.5 MCG/ACT inhaler Inhale 2 puffs into the lungs 2 (two) times daily. 1 Inhaler 3  . Calcium Citrate-Vitamin D (CITRACAL + D PO) Take 1 tablet by mouth 2 (two) times daily.     . fluticasone (FLONASE) 50 MCG/ACT nasal spray Place 1 spray into both nostrils 2 (two) times daily. 1 spray twice daily 16 g 11  . hydrOXYzine (ATARAX/VISTARIL) 25 MG tablet     . loratadine (CLARITIN) 10 MG tablet Take 1 tablet (10 mg total) by mouth daily. 30 tablet 11  . Magnesium Hydroxide (PHILLIPS MILK OF MAGNESIA PO) Take 15 mL by mouth. Take daily at 6 o'clock at night    . Melatonin 5 MG TABS Take by mouth. Take one tablet by mouth at bedtime as needed for rest    . metoprolol tartrate (LOPRESSOR) 25 MG tablet Take 1 tablet (25 mg total) by mouth 2 (two) times daily. 60 tablet 8  . mirabegron ER (MYRBETRIQ) 50 MG TB24 tablet Take 50 mg by mouth daily.    Marland Kitchen omeprazole (PRILOSEC) 20 MG capsule TAKE 2 CAPS BY MOUTH DAILY FOR REFLUX 60 capsule 5  . roflumilast (DALIRESP) 500 MCG TABS tablet Take 500 mcg by mouth  daily.    . shark liver oil-cocoa butter (PREPARATION H) 0.25-3-85.5 % suppository Place 1 suppository rectally as needed.    . sodium chloride (OCEAN) 0.65 % SOLN nasal spray Place 1 spray into both nostrils as needed for congestion. 1 Bottle 1  . tiotropium (SPIRIVA) 18 MCG inhalation capsule Place 1 capsule (18 mcg total) into inhaler and inhale daily. 30 capsule 12  . vitamin E 400 UNIT capsule Take 400 Units by mouth daily.     No current facility-administered medications for this visit.    Allergies  Allergen  Reactions  . Codeine Rash    All over the body.    History   Social History  . Marital Status: Married    Spouse Name: N/A  . Number of Children: N/A  . Years of Education: N/A   Occupational History  . Not on file.   Social History Main Topics  . Smoking status: Never Smoker   . Smokeless tobacco: Never Used  . Alcohol Use: No  . Drug Use: No  . Sexual Activity: Not on file   Other Topics Concern  . Not on file   Social History Narrative     Review of Systems: General: negative for chills, fever, night sweats or weight changes.  Cardiovascular: negative for chest pain, dyspnea on exertion, edema, orthopnea, palpitations, paroxysmal nocturnal dyspnea or shortness of breath Dermatological: negative for rash Respiratory: negative for cough or wheezing Urologic: negative for hematuria Abdominal: negative for nausea, vomiting, diarrhea, bright red blood per rectum, melena, or hematemesis Neurologic: negative for visual changes, syncope, or dizziness All other systems reviewed and are otherwise negative except as noted above.    Blood pressure 106/78, pulse 66, height 5\' 3"  (1.6 m), weight 160 lb (72.576 kg).  General appearance: alert and no distress Neck: no adenopathy, no JVD, supple, symmetrical, trachea midline, thyroid not enlarged, symmetric, no tenderness/mass/nodules and loud high-frequency right carotid bruit Lungs: clear to auscultation bilaterally Heart: soft outflow tract murmur Extremities: extremities normal, atraumatic, no cyanosis or edema  EKG normal sinus rhythm at 66 without ST or T-wave changes. I personally reviewed this EKG  ASSESSMENT AND PLAN:   S/P aortic valve replacement with bioprosthetic valve Status post aortic valve replacement with a 21 mm [facility] Ease  bovine pericardial tissue valve by Dr. Roxy Manns 08/30/12. 2-D echocardiogram performed 10/17/13 revealed normal LV systolic function, mild concentric LVH with a intact aortic  bioprosthesis and a valve area of 1.28 cm2.   HTN (hypertension) History of hypertension blood pressure measured at 106/78. She is on Lopressor. Continue current meds at current dosing       Lorretta Harp MD [facility], [facility] 07/17/2014 12:10 PM

## 2014-07-17 NOTE — Assessment & Plan Note (Signed)
History of hypertension blood pressure measured at 106/78. She is on Lopressor. Continue current meds at current dosing

## 2014-07-17 NOTE — Assessment & Plan Note (Signed)
Status post aortic valve replacement with a 21 mm [facility] Ease  bovine pericardial tissue valve by Dr. Roxy Manns 08/30/12. 2-D echocardiogram performed 10/17/13 revealed normal LV systolic function, mild concentric LVH with a intact aortic bioprosthesis and a valve area of 1.28 cm2.

## 2014-07-23 ENCOUNTER — Ambulatory Visit (HOSPITAL_COMMUNITY): Payer: Medicare Other | Attending: Cardiovascular Disease

## 2014-07-23 ENCOUNTER — Encounter (HOSPITAL_COMMUNITY): Payer: Medicare Other

## 2014-07-23 DIAGNOSIS — I6523 Occlusion and stenosis of bilateral carotid arteries: Secondary | ICD-10-CM | POA: Diagnosis not present

## 2014-07-23 DIAGNOSIS — R0989 Other specified symptoms and signs involving the circulatory and respiratory systems: Secondary | ICD-10-CM

## 2014-08-09 ENCOUNTER — Other Ambulatory Visit: Payer: Self-pay | Admitting: Cardiovascular Disease

## 2014-08-12 NOTE — Telephone Encounter (Signed)
Rx(s) sent to pharmacy electronically.  

## 2014-08-21 ENCOUNTER — Encounter: Payer: Self-pay | Admitting: Internal Medicine

## 2014-08-21 ENCOUNTER — Ambulatory Visit (INDEPENDENT_AMBULATORY_CARE_PROVIDER_SITE_OTHER): Payer: Medicare Other | Admitting: Internal Medicine

## 2014-08-21 VITALS — BP 110/72 | HR 76 | Temp 98.2°F | Resp 20 | Ht 63.0 in | Wt 160.0 lb

## 2014-08-21 DIAGNOSIS — N183 Chronic kidney disease, stage 3 unspecified: Secondary | ICD-10-CM

## 2014-08-21 DIAGNOSIS — N3281 Overactive bladder: Secondary | ICD-10-CM

## 2014-08-21 DIAGNOSIS — I480 Paroxysmal atrial fibrillation: Secondary | ICD-10-CM | POA: Diagnosis not present

## 2014-08-21 DIAGNOSIS — I10 Essential (primary) hypertension: Secondary | ICD-10-CM

## 2014-08-21 DIAGNOSIS — J302 Other seasonal allergic rhinitis: Secondary | ICD-10-CM

## 2014-08-21 DIAGNOSIS — J383 Other diseases of vocal cords: Secondary | ICD-10-CM | POA: Diagnosis not present

## 2014-08-21 DIAGNOSIS — Z954 Presence of other heart-valve replacement: Secondary | ICD-10-CM | POA: Diagnosis not present

## 2014-08-21 DIAGNOSIS — J449 Chronic obstructive pulmonary disease, unspecified: Secondary | ICD-10-CM | POA: Diagnosis not present

## 2014-08-21 DIAGNOSIS — K219 Gastro-esophageal reflux disease without esophagitis: Secondary | ICD-10-CM

## 2014-08-21 DIAGNOSIS — Z953 Presence of xenogenic heart valve: Secondary | ICD-10-CM

## 2014-08-21 NOTE — Progress Notes (Signed)
Patient ID: [name], female   DOB: October 11, 1931, 81 y.o.   MRN: [id number]    Location:    [facility]   Place of Service:   OFFICE  Chief Complaint  Patient presents with  . Medical Management of Chronic Issues    3 month follow-up    HPI:  81 yo female seen today for f/u. She receives botox injection into vocal cord for dystonia. She is followed by Dr Joya Gaskins at [facility]. she has an appt next month.    Allergic rhinitis on flonase/loratidine is stable  COPD on symbicort, spiriva and daliresp. Uses HFA prn.  HTN on metoprolol   GERD on prilosec is stable  OAB on myrbetriq and is followed by urology. Next f/u appt is in September with Dr Vikki Ports  PAF on metoprolol. Rate is controlled. She sees Dr Gwenlyn Found for cardioology. She had a carotid study recently that showed stable stenosis. She will have 2D echo next month to eval valves. She has a hx AV replacement. She does not take anticoagulation.  Past Medical History  Diagnosis Date  . Arthritis   . Hyperlipidemia   . Heart murmur   . Hearing loss   . Change in voice   . Osteoporosis   . Cancer     Breast  . Aortic valve disorder 08/13/2011    ECHO - EF >64%; mild diastolic dysfunction; calcified aortic valve, not well visualized; mod/severe aortic stenosis w/ worsening gradients when compared to 2012  . PVD (peripheral vascular disease) 08/13/2010    R/P MV - normal pattern of perfusion in all regions, EF 76%; no significant wall abnormalities noted; normal perfusion study  . Carotid bruit 08/06/2008    Doppler - R ECA demonstrates noarrowing w/ elevated velocities consistent w/ >70% diameter reduction; R and L ICAs show no evidence of diameter reduction, significant tortuosity or vascular abnormality;   . Aortic stenosis 07/20/2012    Aortic stenosis   . Hypertension     dr Gwenlyn Found  . S/P aortic valve replacement with bioprosthetic valve 08/30/2012    21 mm [facility] Ease bovine pericardial tissue valve  . PAF  (paroxysmal atrial fibrillation) 09/04/2012  . COPD (chronic obstructive pulmonary disease)   . Right carotid bruit     high-grade right external carotid artery stenosis by 2 parts ultrasound 2 years ago    Past Surgical History  Procedure Laterality Date  . Breast lumpectomy  02/25/1997    right  . Mastectomy  09/29/95    left  . Total hip arthroplasty  09/2010  . Colon surgery  1959  . Abdominal hysterectomy      Partial  . Eye surgery  1997    Cataract surgery  . Biopsy shoulder Left 10/08/2005    shave biopsy  . Cosmetic surgery Left 1997    Breast implant  . Left heart cath  08/14/12    Nl cors, AS  . Cardiac catheterization    . Aortic valve replacement N/A 08/30/2012    Procedure: AORTIC VALVE REPLACEMENT (AVR);  Surgeon: Rexene Alberts, MD;  Location: [place];  Service: Open Heart Surgery;  Laterality: N/A;  . Intraoperative transesophageal echocardiogram N/A 08/30/2012    Procedure: INTRAOPERATIVE TRANSESOPHAGEAL ECHOCARDIOGRAM;  Surgeon: Rexene Alberts, MD;  Location: [place];  Service: Open Heart Surgery;  Laterality: N/A;  . Partial hysterectomy    . Left and right heart catheterization with coronary angiogram N/A 08/14/2012    Procedure: LEFT AND RIGHT HEART CATHETERIZATION WITH  CORONARY ANGIOGRAM;  Surgeon: Lorretta Harp, MD;  Location: [facility] CATH LAB;  Service: Cardiovascular;  Laterality: N/A;    Patient Care Team: Gildardo Cranker, DO as PCP - General (Internal Medicine) Druscilla Brownie, MD as Consulting Physician (Dermatology) [name] Irani, MD as Consulting Physician (Gastroenterology) Neldon Mc, MD as Consulting Physician (General Surgery) Lorretta Harp, MD as Consulting Physician (Cardiology) Bjorn Loser, MD as Consulting Physician (Urology)  History   Social History  . Marital Status: Married    Spouse Name: N/A  . Number of Children: N/A  . Years of Education: N/A   Occupational History  . Not on file.   Social History Main Topics  . Smoking  status: Never Smoker   . Smokeless tobacco: Never Used  . Alcohol Use: No  . Drug Use: No  . Sexual Activity: Not on file   Other Topics Concern  . Not on file   Social History Narrative     reports that she has never smoked. She has never used smokeless tobacco. She reports that she does not drink alcohol or use illicit drugs.  Allergies  Allergen Reactions  . Codeine Rash    All over the body.    Medications: Patient's Medications  New Prescriptions   No medications on file  Previous Medications   ACETAMINOPHEN (TYLENOL) 325 MG TABLET    Take 2 tablets (650 mg total) by mouth every 6 (six) hours as needed.   ALBUTEROL (PROAIR HFA) 108 (90 BASE) MCG/ACT INHALER    Inhale 1 puff into the lungs every 6 (six) hours as needed for wheezing or shortness of breath.   BIOTIN 5000 MCG CAPS    Take 1 capsule by mouth daily.   BUDESONIDE-FORMOTEROL (SYMBICORT) 160-4.5 MCG/ACT INHALER    Inhale 2 puffs into the lungs 2 (two) times daily.   CALCIUM CITRATE-VITAMIN D (CITRACAL + D PO)    Take 1 tablet by mouth 2 (two) times daily.    FLUTICASONE (FLONASE) 50 MCG/ACT NASAL SPRAY    Place 1 spray into both nostrils 2 (two) times daily. 1 spray twice daily   HYDROXYZINE (ATARAX/VISTARIL) 25 MG TABLET       LORATADINE (CLARITIN) 10 MG TABLET    Take 1 tablet (10 mg total) by mouth daily.   MAGNESIUM HYDROXIDE (PHILLIPS MILK OF MAGNESIA PO)    Take 15 mL by mouth. Take daily at 6 o'clock at night   MELATONIN 5 MG TABS    Take by mouth. Take one tablet by mouth at bedtime as needed for rest   METOPROLOL TARTRATE (LOPRESSOR) 25 MG TABLET    TAKE 1 TABLET (25 MG TOTAL) BY MOUTH 2 (TWO) TIMES DAILY.   MIRABEGRON ER (MYRBETRIQ) 50 MG TB24 TABLET    Take 50 mg by mouth daily.   OMEPRAZOLE (PRILOSEC) 20 MG CAPSULE    TAKE 2 CAPS BY MOUTH DAILY FOR REFLUX   ROFLUMILAST (DALIRESP) 500 MCG TABS TABLET    Take 500 mcg by mouth daily.   SHARK LIVER OIL-COCOA BUTTER (PREPARATION H) 0.25-3-85.5 % SUPPOSITORY     Place 1 suppository rectally as needed.   SODIUM CHLORIDE (OCEAN) 0.65 % SOLN NASAL SPRAY    Place 1 spray into both nostrils as needed for congestion.   TIOTROPIUM (SPIRIVA) 18 MCG INHALATION CAPSULE    Place 1 capsule (18 mcg total) into inhaler and inhale daily.   VITAMIN E 400 UNIT CAPSULE    Take 400 Units by mouth daily.  Modified Medications  No medications on file  Discontinued Medications   No medications on file    Review of Systems  HENT: Positive for voice change (chronic; receives botox injection 4 times a year at [facility]).   Skin: Positive for rash (occasional).    Filed Vitals:   08/21/14 1427  BP: 110/72  Pulse: 76  Temp: 98.2 F (36.8 C)  TempSrc: Oral  Resp: 20  Height: 5\' 3"  (1.6 m)  Weight: 160 lb (72.576 kg)  SpO2: 96%   Body mass index is 28.35 kg/(m^2).  Physical Exam  Constitutional: She is oriented to person, place, and time. She appears well-developed and well-nourished.  HENT:  Mouth/Throat: Oropharynx is clear and moist. No oropharyngeal exudate.  Voice is strained  Eyes: Pupils are equal, round, and reactive to light. No scleral icterus.  Neck: Neck supple. Carotid bruit is present (B/L). No tracheal deviation present. No thyromegaly present.  Cardiovascular: Normal rate, regular rhythm and intact distal pulses.  Exam reveals no gallop and no friction rub.   Murmur heard.  Systolic murmur is present with a grade of 2/6  Aortic valve click. No LE edema b/l. no calf TTP.   Pulmonary/Chest: Effort normal and breath sounds normal. No stridor. No respiratory distress. She has no wheezes. She has no rales.  Abdominal: Soft. Bowel sounds are normal. She exhibits no distension and no mass. There is no hepatomegaly. There is no tenderness. There is no rebound and no guarding.  Musculoskeletal: She exhibits edema and tenderness.  Lymphadenopathy:    She has no cervical adenopathy.  Neurological: She is alert and oriented to person, place, and  time. She has normal reflexes.  Skin: Skin is warm and dry. No rash noted.  Distal Left leg abrasion  Psychiatric: She has a normal mood and affect. Her behavior is normal. Thought content normal.     Labs reviewed: Appointment on 05/22/2014  Component Date Value Ref Range Status  . Glucose 05/22/2014 90  65 - 99 mg/dL Final  . BUN 05/22/2014 15  8 - 27 mg/dL Final  . Creatinine, Ser 05/22/2014 1.15* 0.57 - 1.00 mg/dL Final  . GFR calc non Af Amer 05/22/2014 45* >59 mL/min/1.73 Final  . GFR calc Af Amer 05/22/2014 52* >59 mL/min/1.73 Final  . BUN/Creatinine Ratio 05/22/2014 13  11 - 26 Final  . Sodium 05/22/2014 142  134 - 144 mmol/L Final  . Potassium 05/22/2014 4.0  3.5 - 5.2 mmol/L Final  . Chloride 05/22/2014 102  97 - 108 mmol/L Final  . CO2 05/22/2014 25  18 - 29 mmol/L Final  . Calcium 05/22/2014 10.0  8.7 - 10.3 mg/dL Final  . Total Protein 05/22/2014 6.1  6.0 - 8.5 g/dL Final  . Albumin 05/22/2014 4.3  3.5 - 4.7 g/dL Final  . Globulin, Total 05/22/2014 1.8  1.5 - 4.5 g/dL Final  . Albumin/Globulin Ratio 05/22/2014 2.4  1.1 - 2.5 Final  . Bilirubin Total 05/22/2014 0.5  0.0 - 1.2 mg/dL Final  . Alkaline Phosphatase 05/22/2014 63  39 - 117 IU/L Final  . AST 05/22/2014 18  0 - 40 IU/L Final  . ALT 05/22/2014 11  0 - 32 IU/L Final  . WBC 05/22/2014 6.9  3.4 - 10.8 x10E3/uL Final  . RBC 05/22/2014 3.98  3.77 - 5.28 x10E6/uL Final  . Hemoglobin 05/22/2014 12.6  11.1 - 15.9 g/dL Final  . HCT 05/22/2014 37.4  34.0 - 46.6 % Final  . MCV 05/22/2014 94  79 - 97 fL  Final  . MCH 05/22/2014 31.7  26.6 - 33.0 pg Final  . MCHC 05/22/2014 33.7  31.5 - 35.7 g/dL Final  . RDW 05/22/2014 14.1  12.3 - 15.4 % Final  . Platelets 05/22/2014 218  150 - 379 x10E3/uL Final  . Neutrophils Relative % 05/22/2014 54   Final  . Lymphs 05/22/2014 33   Final  . Monocytes 05/22/2014 9   Final  . Eos 05/22/2014 3   Final  . Basos 05/22/2014 1   Final  . Neutrophils Absolute 05/22/2014 3.8  1.4 - 7.0  x10E3/uL Final  . Lymphocytes Absolute 05/22/2014 2.3  0.7 - 3.1 x10E3/uL Final  . Monocytes Absolute 05/22/2014 0.6  0.1 - 0.9 x10E3/uL Final  . Eosinophils Absolute 05/22/2014 0.2  0.0 - 0.4 x10E3/uL Final  . Basophils Absolute 05/22/2014 0.1  0.0 - 0.2 x10E3/uL Final  . Immature Granulocytes 05/22/2014 0   Final  . Immature Grans (Abs) 05/22/2014 0.0  0.0 - 0.1 x10E3/uL Final  . Cholesterol, Total 05/22/2014 144  100 - 199 mg/dL Final  . Triglycerides 05/22/2014 83  0 - 149 mg/dL Final  . HDL 05/22/2014 57  >39 mg/dL Final   Comment: According to ATP-III Guidelines, HDL-C >59 mg/dL is considered a negative risk factor for CHD.   Marland Kitchen VLDL Cholesterol Cal 05/22/2014 17  5 - 40 mg/dL Final  . LDL Calculated 05/22/2014 70  0 - 99 mg/dL Final  . Chol/HDL Ratio 05/22/2014 2.5  0.0 - 4.4 ratio units Final   Comment:                                   T. Chol/HDL Ratio                                             Men  Women                               1/2 Avg.Risk  3.4    3.3                                   Avg.Risk  5.0    4.4                                2X Avg.Risk  9.6    7.1                                3X Avg.Risk 23.4   11.0     No results found.   Assessment/Plan    ICD-9-CM ICD-10-CM   1. Laryngeal dystonia - stable 478.79 J38.3   2. Chronic obstructive pulmonary disease, unspecified COPD, unspecified chronic bronchitis type - stable 496 J44.9   3. Essential hypertension - controlled 401.9 I10   4. PAF (paroxysmal atrial fibrillation) - rate controlled 427.31 I48.0   5. S/P aortic valve replacement with bioprosthetic valve  V42.2 Z95.4   6. CKD (chronic kidney disease) stage 3, GFR 30-59 ml/min -stable 585.3 N18.3   7. Gastroesophageal reflux disease without esophagitis -  stable 530.81 K21.9   8. Other seasonal allergic rhinitis - controlled 477.8 J30.2   9. Overactive bladder stable 596.51 N32.81    Continue current medications as ordered  Follow up with cardiology  and urology as scheduled  Keep appt with Dr Joya Gaskins as scheduled for botox injections  Follow up in 3 mos for CPE  [facility] S. Perlie Gold  [facility] [address] [place], [place] [address] [phone] Cell (Monday-Friday 8 AM - 5 PM) [phone] After 5 PM and follow prompts

## 2014-08-21 NOTE — Patient Instructions (Signed)
Continue current medications as ordered  Follow up with cardiology and urology as scheduled  Keep appt with Dr Joya Gaskins as scheduled for botox injections  Follow up in 3 mos for CPE

## 2014-09-13 DIAGNOSIS — J385 Laryngeal spasm: Secondary | ICD-10-CM | POA: Diagnosis not present

## 2014-09-26 ENCOUNTER — Ambulatory Visit (INDEPENDENT_AMBULATORY_CARE_PROVIDER_SITE_OTHER): Payer: Medicare Other | Admitting: Nurse Practitioner

## 2014-09-26 ENCOUNTER — Telehealth: Payer: Self-pay | Admitting: Cardiovascular Disease

## 2014-09-26 ENCOUNTER — Encounter: Payer: Self-pay | Admitting: Nurse Practitioner

## 2014-09-26 VITALS — BP 150/64 | HR 59 | Temp 98.1°F | Resp 18 | Ht 63.0 in | Wt 161.4 lb

## 2014-09-26 DIAGNOSIS — J209 Acute bronchitis, unspecified: Secondary | ICD-10-CM | POA: Diagnosis not present

## 2014-09-26 MED ORDER — DOXYCYCLINE HYCLATE 100 MG PO TABS: 100.0000 mg | ORAL_TABLET | Freq: Two times a day (BID) | ORAL | Status: DC

## 2014-09-26 NOTE — Telephone Encounter (Signed)
Closed encounter °

## 2014-09-26 NOTE — Patient Instructions (Signed)
To start doxycyline 100 mg by mouth twice daily for bronchitis  Cont to use mucinex DM twice daily for cough and congestion Increase water intake Cont to use albuterol every 6 hours as needed for cough and shortness of breath    Acute Bronchitis Bronchitis is inflammation of the airways that extend from the windpipe into the lungs (bronchi). The inflammation often causes mucus to develop. This leads to a cough, which is the most common symptom of bronchitis.  In acute bronchitis, the condition usually develops suddenly and goes away over time, usually in a couple weeks. Smoking, allergies, and asthma can make bronchitis worse. Repeated episodes of bronchitis may cause further lung problems.  CAUSES Acute bronchitis is most often caused by the same virus that causes a cold. The virus can spread from person to person (contagious) through coughing, sneezing, and touching contaminated objects. SIGNS AND SYMPTOMS   Cough.   Fever.   Coughing up mucus.   Body aches.   Chest congestion.   Chills.   Shortness of breath.   Sore throat.  DIAGNOSIS  Acute bronchitis is usually diagnosed through a physical exam. Your health care provider will also ask you questions about your medical history. Tests, such as chest X-rays, are sometimes done to rule out other conditions.  TREATMENT  Acute bronchitis usually goes away in a couple weeks. Oftentimes, no medical treatment is necessary. Medicines are sometimes given for relief of fever or cough. Antibiotic medicines are usually not needed but may be prescribed in certain situations. In some cases, an inhaler may be recommended to help reduce shortness of breath and control the cough. A cool mist vaporizer may also be used to help thin bronchial secretions and make it easier to clear the chest.  HOME CARE INSTRUCTIONS  Get plenty of rest.   Drink enough fluids to keep your urine clear or pale yellow (unless you have a medical condition that  requires fluid restriction). Increasing fluids may help thin your respiratory secretions (sputum) and reduce chest congestion, and it will prevent dehydration.   Take medicines only as directed by your health care provider.  If you were prescribed an antibiotic medicine, finish it all even if you start to feel better.  Avoid smoking and secondhand smoke. Exposure to cigarette smoke or irritating chemicals will make bronchitis worse. If you are a smoker, consider using nicotine gum or skin patches to help control withdrawal symptoms. Quitting smoking will help your lungs heal faster.   Reduce the chances of another bout of acute bronchitis by washing your hands frequently, avoiding people with cold symptoms, and trying not to touch your hands to your mouth, nose, or eyes.   Keep all follow-up visits as directed by your health care provider.  SEEK MEDICAL CARE IF: Your symptoms do not improve after 1 week of treatment.  SEEK IMMEDIATE MEDICAL CARE IF:  You develop an increased fever or chills.   You have chest pain.   You have severe shortness of breath.  You have bloody sputum.   You develop dehydration.  You faint or repeatedly feel like you are going to pass out.  You develop repeated vomiting.  You develop a severe headache. MAKE SURE YOU:   Understand these instructions.  Will watch your condition.  Will get help right away if you are not doing well or get worse. Document Released: 03/25/2004 Document Revised: 07/02/2013 Document Reviewed: 08/08/2012 [facility] Patient Information 2015 [place], [place]. This information is not intended to replace advice given  to you by your health care provider. Make sure you discuss any questions you have with your health care provider.  

## 2014-09-26 NOTE — Progress Notes (Signed)
Patient ID: [name], female   DOB: 1931/03/24, 81 y.o.   MRN: [id number]    PCP: Gildardo Cranker, DO  Allergies  Allergen Reactions  . Codeine Rash    All over the body.    Chief Complaint  Patient presents with  . Acute Visit    cold x 3 weeks, cold is in her chest     HPI: Patient is a 81 y.o. female seen in the office today due to chest congestion for 3 weeks. Cough with yellow and green sputum for 3 weeks. Taking mucinex DM twice daily without much help. Wakes her up at night.  Feeling congestion in her nose and chest Ear fullness.  No fever or chills.  Fatigued  Using albuterol as needed for shortness of breath and needing it more in the last 3 weeks.  Shortness of breath on exertion.  No chest pains  Review of Systems:  Review of Systems  Constitutional: Positive for fatigue. Negative for fever, activity change and appetite change.  HENT: Positive for congestion, postnasal drip, rhinorrhea, sinus pressure and sore throat. Negative for ear pain and mouth sores.   Respiratory: Negative for shortness of breath and wheezing.   Cardiovascular: Negative for chest pain and palpitations.  Gastrointestinal: Negative for diarrhea and constipation.  Neurological: Negative for dizziness and weakness.    Past Medical History  Diagnosis Date  . Arthritis   . Hyperlipidemia   . Heart murmur   . Hearing loss   . Change in voice   . Osteoporosis   . Cancer     Breast  . Aortic valve disorder 08/13/2011    ECHO - EF >78%; mild diastolic dysfunction; calcified aortic valve, not well visualized; mod/severe aortic stenosis w/ worsening gradients when compared to 2012  . PVD (peripheral vascular disease) 08/13/2010    R/P MV - normal pattern of perfusion in all regions, EF 76%; no significant wall abnormalities noted; normal perfusion study  . Carotid bruit 08/06/2008    Doppler - R ECA demonstrates noarrowing w/ elevated velocities consistent w/ >70% diameter reduction; R and L  ICAs show no evidence of diameter reduction, significant tortuosity or vascular abnormality;   . Aortic stenosis 07/20/2012    Aortic stenosis   . Hypertension     dr Gwenlyn Found  . S/P aortic valve replacement with bioprosthetic valve 08/30/2012    21 mm [facility] Ease bovine pericardial tissue valve  . PAF (paroxysmal atrial fibrillation) 09/04/2012  . COPD (chronic obstructive pulmonary disease)   . Right carotid bruit     high-grade right external carotid artery stenosis by 2 parts ultrasound 2 years ago   Past Surgical History  Procedure Laterality Date  . Breast lumpectomy  02/25/1997    right  . Mastectomy  09/29/95    left  . Total hip arthroplasty  09/2010  . Colon surgery  1959  . Abdominal hysterectomy      Partial  . Eye surgery  1997    Cataract surgery  . Biopsy shoulder Left 10/08/2005    shave biopsy  . Cosmetic surgery Left 1997    Breast implant  . Left heart cath  08/14/12    Nl cors, AS  . Cardiac catheterization    . Aortic valve replacement N/A 08/30/2012    Procedure: AORTIC VALVE REPLACEMENT (AVR);  Surgeon: Rexene Alberts, MD;  Location: [place];  Service: Open Heart Surgery;  Laterality: N/A;  . Intraoperative transesophageal echocardiogram N/A 08/30/2012    Procedure:  INTRAOPERATIVE TRANSESOPHAGEAL ECHOCARDIOGRAM;  Surgeon: Rexene Alberts, MD;  Location: [place];  Service: Open Heart Surgery;  Laterality: N/A;  . Partial hysterectomy    . Left and right heart catheterization with coronary angiogram N/A 08/14/2012    Procedure: LEFT AND RIGHT HEART CATHETERIZATION WITH CORONARY ANGIOGRAM;  Surgeon: Lorretta Harp, MD;  Location[facility] CATH LAB;  Service: Cardiovascular;  Laterality: N/A;   Social History:   reports that she has never smoked. She has never used smokeless tobacco. She reports that she does not drink alcohol or use illicit drugs.  Family History  Problem Relation Age of Onset  . Cancer Mother     Bladder  . Early death Father     Tractor accident    . Early death Brother     Radiation protection practitioner  . Early death Brother     during heart surgery    Medications: Patient's Medications  New Prescriptions   No medications on file  Previous Medications   ACETAMINOPHEN (TYLENOL) 325 MG TABLET    Take 2 tablets (650 mg total) by mouth every 6 (six) hours as needed.   ALBUTEROL (PROAIR HFA) 108 (90 BASE) MCG/ACT INHALER    Inhale 1 puff into the lungs every 6 (six) hours as needed for wheezing or shortness of breath.   BIOTIN 5000 MCG CAPS    Take 1 capsule by mouth daily.   BUDESONIDE-FORMOTEROL (SYMBICORT) 160-4.5 MCG/ACT INHALER    Inhale 2 puffs into the lungs 2 (two) times daily.   CALCIUM CITRATE-VITAMIN D (CITRACAL + D PO)    Take 1 tablet by mouth 2 (two) times daily.    FLUTICASONE (FLONASE) 50 MCG/ACT NASAL SPRAY    Place 1 spray into both nostrils 2 (two) times daily. 1 spray twice daily   HYDROXYZINE (ATARAX/VISTARIL) 25 MG TABLET       LORATADINE (CLARITIN) 10 MG TABLET    Take 1 tablet (10 mg total) by mouth daily.   MAGNESIUM HYDROXIDE (PHILLIPS MILK OF MAGNESIA PO)    Take 15 mL by mouth. Take daily at 6 o'clock at night   MELATONIN 5 MG TABS    Take by mouth. Take one tablet by mouth at bedtime as needed for rest   METOPROLOL TARTRATE (LOPRESSOR) 25 MG TABLET    TAKE 1 TABLET (25 MG TOTAL) BY MOUTH 2 (TWO) TIMES DAILY.   MIRABEGRON ER (MYRBETRIQ) 50 MG TB24 TABLET    Take 50 mg by mouth daily.   OMEPRAZOLE (PRILOSEC) 20 MG CAPSULE    TAKE 2 CAPS BY MOUTH DAILY FOR REFLUX   ROFLUMILAST (DALIRESP) 500 MCG TABS TABLET    Take 500 mcg by mouth daily.   SHARK LIVER OIL-COCOA BUTTER (PREPARATION H) 0.25-3-85.5 % SUPPOSITORY    Place 1 suppository rectally as needed.   SODIUM CHLORIDE (OCEAN) 0.65 % SOLN NASAL SPRAY    Place 1 spray into both nostrils as needed for congestion.   TIOTROPIUM (SPIRIVA) 18 MCG INHALATION CAPSULE    Place 1 capsule (18 mcg total) into inhaler and inhale daily.   VITAMIN E 400 UNIT CAPSULE    Take 400 Units by  mouth daily.  Modified Medications   No medications on file  Discontinued Medications   No medications on file     Physical Exam:  Filed Vitals:   09/26/14 1548  BP: 150/64  Pulse: 59  Temp: 98.1 F (36.7 C)  TempSrc: Oral  Resp: 18  Height: 5\' 3"  (1.6 m)  Weight: 161  lb 6.4 oz (73.211 kg)  SpO2: 97%    Physical Exam  Constitutional: She is oriented to person, place, and time. She appears well-developed and well-nourished. No distress.  HENT:  Head: Normocephalic and atraumatic.  Right Ear: External ear normal.  Left Ear: External ear normal.  Nose: Nose normal.  Mouth/Throat: Oropharynx is clear and moist. No oropharyngeal exudate.  Eyes: Conjunctivae are normal. Pupils are equal, round, and reactive to light.  Neck: Normal range of motion. Neck supple.  Cardiovascular: Normal rate, regular rhythm and normal heart sounds.   Pulmonary/Chest: Effort normal and breath sounds normal.  Musculoskeletal: She exhibits no edema.  Neurological: She is alert and oriented to person, place, and time.  Skin: Skin is warm and dry. She is not diaphoretic.  Psychiatric: She has a normal mood and affect.    Labs reviewed: Basic Metabolic Panel:  Recent Labs  05/22/14 0842  NA 142  K 4.0  CL 102  CO2 25  GLUCOSE 90  BUN 15  CREATININE 1.15*  CALCIUM 10.0   Liver Function Tests:  Recent Labs  05/22/14 0842  AST 18  ALT 11  ALKPHOS 63  BILITOT 0.5  PROT 6.1   No results for input(s): LIPASE, AMYLASE in the last 8760 hours. No results for input(s): AMMONIA in the last 8760 hours. CBC:  Recent Labs  05/22/14 0842  WBC 6.9  NEUTROABS 3.8  HGB 12.6  HCT 37.4  MCV 94  PLT 218   Lipid Panel:  Recent Labs  05/22/14 0842  CHOL 144  HDL 57  LDLCALC 70  TRIG 83  CHOLHDL 2.5   TSH: No results for input(s): TSH in the last 8760 hours. A1C: Lab Results  Component Value Date   HGBA1C 5.5 08/28/2012     Assessment/Plan 1. Acute bronchitis,  unspecified organism -cont mucinex DM twice daily with increase water intake -use albuterol as needed - doxycycline (VIBRA-TABS) 100 MG tablet; Take 1 tablet (100 mg total) by mouth 2 (two) times daily.  Dispense: 14 tablet; Refill: 0 -discussed return precautions, pt understands and agrees  [employer]. Harle Battiest  [facility] [phone] 8 am - 5 pm) [phone] (after hours)

## 2014-10-15 ENCOUNTER — Other Ambulatory Visit: Payer: Self-pay

## 2014-10-15 ENCOUNTER — Ambulatory Visit (HOSPITAL_COMMUNITY): Payer: Medicare Other | Attending: Cardiovascular Disease

## 2014-10-15 DIAGNOSIS — Z952 Presence of prosthetic heart valve: Secondary | ICD-10-CM | POA: Insufficient documentation

## 2014-10-15 DIAGNOSIS — I517 Cardiomegaly: Secondary | ICD-10-CM | POA: Insufficient documentation

## 2014-10-15 DIAGNOSIS — I10 Essential (primary) hypertension: Secondary | ICD-10-CM | POA: Insufficient documentation

## 2014-10-15 DIAGNOSIS — Z954 Presence of other heart-valve replacement: Secondary | ICD-10-CM

## 2014-11-27 ENCOUNTER — Ambulatory Visit (INDEPENDENT_AMBULATORY_CARE_PROVIDER_SITE_OTHER): Payer: Medicare Other | Admitting: Internal Medicine

## 2014-11-27 ENCOUNTER — Encounter: Payer: Self-pay | Admitting: Internal Medicine

## 2014-11-27 VITALS — BP 116/74 | HR 58 | Temp 97.4°F | Resp 20 | Ht 63.0 in | Wt 163.0 lb

## 2014-11-27 DIAGNOSIS — N183 Chronic kidney disease, stage 3 unspecified: Secondary | ICD-10-CM

## 2014-11-27 DIAGNOSIS — I480 Paroxysmal atrial fibrillation: Secondary | ICD-10-CM | POA: Diagnosis not present

## 2014-11-27 DIAGNOSIS — M810 Age-related osteoporosis without current pathological fracture: Secondary | ICD-10-CM

## 2014-11-27 DIAGNOSIS — J383 Other diseases of vocal cords: Secondary | ICD-10-CM | POA: Diagnosis not present

## 2014-11-27 DIAGNOSIS — I10 Essential (primary) hypertension: Secondary | ICD-10-CM

## 2014-11-27 DIAGNOSIS — Z953 Presence of xenogenic heart valve: Secondary | ICD-10-CM

## 2014-11-27 DIAGNOSIS — Z954 Presence of other heart-valve replacement: Secondary | ICD-10-CM | POA: Diagnosis not present

## 2014-11-27 DIAGNOSIS — J449 Chronic obstructive pulmonary disease, unspecified: Secondary | ICD-10-CM | POA: Diagnosis not present

## 2014-11-27 DIAGNOSIS — Z0000 Encounter for general adult medical examination without abnormal findings: Secondary | ICD-10-CM | POA: Diagnosis not present

## 2014-11-27 DIAGNOSIS — J302 Other seasonal allergic rhinitis: Secondary | ICD-10-CM

## 2014-11-27 DIAGNOSIS — K219 Gastro-esophageal reflux disease without esophagitis: Secondary | ICD-10-CM

## 2014-11-27 DIAGNOSIS — N3281 Overactive bladder: Secondary | ICD-10-CM | POA: Diagnosis not present

## 2014-11-27 DIAGNOSIS — Z23 Encounter for immunization: Secondary | ICD-10-CM | POA: Diagnosis not present

## 2014-11-27 NOTE — Progress Notes (Signed)
Passed clock test 

## 2014-11-27 NOTE — Progress Notes (Signed)
Patient ID: [name], female   DOB: Dec 19, 1931, 81 y.o.   MRN: [id number] Subjective:    [name] is a 81 y.o. female who presents for Medicare Initial preventive examination. She has no concerns today.   NOTE: per pt, last PFTs done several yrs ago.   Preventive Screening-Counseling & Management  Tobacco History  Smoking status  . Never Smoker   Smokeless tobacco  . Never Used     Problems Prior to Visit 1. None   Current Problems (verified) Patient Active Problem List   Diagnosis Date Noted  . Cough 09/12/2013  . COPD exacerbation 05/16/2013  . Acute bronchitis 04/11/2013  . 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  . Long term (current) use of anticoagulants 10/02/2012  . Overactive bladder 09/11/2012  . Acute blood loss anemia 09/11/2012  . Weakness generalized 09/11/2012  . PAF (paroxysmal atrial fibrillation) 09/04/2012  . S/P aortic valve replacement with bioprosthetic valve 08/30/2012  . Aortic valve stenosis, critical 08/28/2012  . Normal coronary arteries-6/14 08/15/2012  . Dyspnea-exertional 08/15/2012  . Chest pain-exertional 08/15/2012  . COPD (chronic obstructive pulmonary disease)- noted on CXR 08/08/12 08/15/2012  . Aortic stenosis- critical AS at cath 08/14/12, with surgery 07/20/2012  . Unspecified constipation 06/06/2012  . Insomnia 06/06/2012  . HTN (hypertension) 06/06/2012  . Osteoporosis 06/06/2012  . GERD (gastroesophageal reflux disease) 06/06/2012  . Hx Left Breast cancer, DCIS 09/29/1995    Medications Prior to Visit Current Outpatient Prescriptions on File Prior to Visit  Medication Sig Dispense Refill  . acetaminophen (TYLENOL) 325 MG tablet Take 2 tablets (650 mg total) by mouth every 6 (six) hours as needed.    Marland Kitchen albuterol (PROAIR HFA) 108 (90 BASE) MCG/ACT inhaler Inhale 1 puff into the lungs every 6 (six) hours as needed for wheezing or shortness of breath. (Patient taking differently: Inhale 2 puffs into the lungs every 6 (six) hours  as needed for wheezing or shortness of breath. ) 1 Inhaler 5  . Biotin 5000 MCG CAPS Take 1 capsule by mouth daily.    . budesonide-formoterol (SYMBICORT) 160-4.5 MCG/ACT inhaler Inhale 2 puffs into the lungs 2 (two) times daily. 1 Inhaler 3  . Calcium Citrate-Vitamin D (CITRACAL + D PO) Take 1 tablet by mouth 2 (two) times daily.     Marland Kitchen doxycycline (VIBRA-TABS) 100 MG tablet Take 1 tablet (100 mg total) by mouth 2 (two) times daily. 14 tablet 0  . fluticasone (FLONASE) 50 MCG/ACT nasal spray Place 1 spray into both nostrils 2 (two) times daily. 1 spray twice daily 16 g 11  . hydrOXYzine (ATARAX/VISTARIL) 25 MG tablet     . loratadine (CLARITIN) 10 MG tablet Take 1 tablet (10 mg total) by mouth daily. 30 tablet 11  . Magnesium Hydroxide (PHILLIPS MILK OF MAGNESIA PO) Take 15 mL by mouth. Take daily at 6 o'clock at night    . Melatonin 5 MG TABS Take by mouth. Take one tablet by mouth at bedtime as needed for rest    . metoprolol tartrate (LOPRESSOR) 25 MG tablet TAKE 1 TABLET (25 MG TOTAL) BY MOUTH 2 (TWO) TIMES DAILY. 60 tablet 8  . mirabegron ER (MYRBETRIQ) 50 MG TB24 tablet Take 50 mg by mouth daily.    Marland Kitchen omeprazole (PRILOSEC) 20 MG capsule TAKE 2 CAPS BY MOUTH DAILY FOR REFLUX 60 capsule 5  . roflumilast (DALIRESP) 500 MCG TABS tablet Take 500 mcg by mouth daily.    . shark liver oil-cocoa butter (PREPARATION H) 0.25-3-85.5 % suppository Place 1  suppository rectally as needed.    . sodium chloride (OCEAN) 0.65 % SOLN nasal spray Place 1 spray into both nostrils as needed for congestion. 1 Bottle 1  . tiotropium (SPIRIVA) 18 MCG inhalation capsule Place 1 capsule (18 mcg total) into inhaler and inhale daily. 30 capsule 12  . vitamin E 400 UNIT capsule Take 400 Units by mouth daily.     No current facility-administered medications on file prior to visit.    Current Medications (verified) Current Outpatient Prescriptions  Medication Sig Dispense Refill  . acetaminophen (TYLENOL) 325 MG  tablet Take 2 tablets (650 mg total) by mouth every 6 (six) hours as needed.    Marland Kitchen albuterol (PROAIR HFA) 108 (90 BASE) MCG/ACT inhaler Inhale 1 puff into the lungs every 6 (six) hours as needed for wheezing or shortness of breath. (Patient taking differently: Inhale 2 puffs into the lungs every 6 (six) hours as needed for wheezing or shortness of breath. ) 1 Inhaler 5  . Biotin 5000 MCG CAPS Take 1 capsule by mouth daily.    . budesonide-formoterol (SYMBICORT) 160-4.5 MCG/ACT inhaler Inhale 2 puffs into the lungs 2 (two) times daily. 1 Inhaler 3  . Calcium Citrate-Vitamin D (CITRACAL + D PO) Take 1 tablet by mouth 2 (two) times daily.     Marland Kitchen doxycycline (VIBRA-TABS) 100 MG tablet Take 1 tablet (100 mg total) by mouth 2 (two) times daily. 14 tablet 0  . fluticasone (FLONASE) 50 MCG/ACT nasal spray Place 1 spray into both nostrils 2 (two) times daily. 1 spray twice daily 16 g 11  . hydrOXYzine (ATARAX/VISTARIL) 25 MG tablet     . loratadine (CLARITIN) 10 MG tablet Take 1 tablet (10 mg total) by mouth daily. 30 tablet 11  . Magnesium Hydroxide (PHILLIPS MILK OF MAGNESIA PO) Take 15 mL by mouth. Take daily at 6 o'clock at night    . Melatonin 5 MG TABS Take by mouth. Take one tablet by mouth at bedtime as needed for rest    . metoprolol tartrate (LOPRESSOR) 25 MG tablet TAKE 1 TABLET (25 MG TOTAL) BY MOUTH 2 (TWO) TIMES DAILY. 60 tablet 8  . mirabegron ER (MYRBETRIQ) 50 MG TB24 tablet Take 50 mg by mouth daily.    Marland Kitchen omeprazole (PRILOSEC) 20 MG capsule TAKE 2 CAPS BY MOUTH DAILY FOR REFLUX 60 capsule 5  . roflumilast (DALIRESP) 500 MCG TABS tablet Take 500 mcg by mouth daily.    . shark liver oil-cocoa butter (PREPARATION H) 0.25-3-85.5 % suppository Place 1 suppository rectally as needed.    . sodium chloride (OCEAN) 0.65 % SOLN nasal spray Place 1 spray into both nostrils as needed for congestion. 1 Bottle 1  . tiotropium (SPIRIVA) 18 MCG inhalation capsule Place 1 capsule (18 mcg total) into inhaler  and inhale daily. 30 capsule 12  . vitamin E 400 UNIT capsule Take 400 Units by mouth daily.     No current facility-administered medications for this visit.     Allergies (verified) Codeine   PAST HISTORY  Family History Family History  Problem Relation Age of Onset  . Cancer Mother     Bladder  . Early death Father     Tractor accident  . Early death Brother     Radiation protection practitioner  . Early death Brother     during heart surgery    Social History Social History  Substance Use Topics  . Smoking status: Never Smoker   . Smokeless tobacco: Never Used  . Alcohol Use:  No     Are there smokers in your home (other than you)? No - NA  Risk Factors Current exercise habits: Home exercise routine includes stretching.  Dietary issues discussed: healthy food choices  Cardiac risk factors: advanced age (older than 70 for men, 52 for women) and hypertension.  Depression Screen (Note: if answer to either of the following is "Yes", a more complete depression screening is indicated)   Over the past 2 weeks, have you felt down, depressed or hopeless? No  Over the past 2 weeks, have you felt little interest or pleasure in doing things? No  Have you lost interest or pleasure in daily life? No  Do you often feel hopeless? No  Do you cry easily over simple problems? No  Activities of Daily Living In your present state of health, do you have any difficulty performing the following activities?:  Driving? No Managing money?  No Feeding yourself? No Getting from bed to chair? No  Climbing a flight of stairs? No Preparing food and eating?: NO Bathing or showering? No Getting dressed: no Getting to the toilet? NO Using the toilet: no  Moving around from place to place: no  In the past year have you fallen or had a near fall?:no   Are you sexually active?  No  Do you have more than one partner?  NA  Hearing Difficulties:  Do you often ask people to speak up or repeat themselves? yes   Do you experience ringing or noises in your ears? no Do you have difficulty understanding soft or whispered voices? yes   Do you feel that you have a problem with memory? No  Do you often misplace items? No  Do you feel safe at home?  Yes  Cognitive Testing    Advanced Directives have been discussed with the patient? Yes  List the Names of Other Physician/Practitioners you currently use: 1.  Eye specialists 2.  Urologist  3.  Cardiologist   Indicate any recent Medical Services you may have received from other than [facility] providers in the past year (date may be approximate).   Immunization History  Administered Date(s) Administered  . Influenza,inj,Quad PF,36+ Mos 11/29/2012, 11/27/2013  . Pneumococcal Conjugate-13 05/21/2014  . Pneumococcal Polysaccharide-23 01/11/2005, 08/15/2012  . Tdap 05/28/2014  . Zoster 01/14/2009    Screening Tests Health Maintenance  Topic Date Due  . INFLUENZA VACCINE  09/30/2014  . TETANUS/TDAP  05/27/2024  . DEXA SCAN  Completed  . ZOSTAVAX  Completed  . PNA vac Low Risk Adult  Completed    All answers were reviewed with the patient and necessary referrals were made:  Gildardo Cranker, DO   11/27/2014   History reviewed: allergies, current medications, past family history, past medical history, past social history, past surgical history and problem list  Review of Systems   Review of Systems  Constitutional: Negative for fever, chills and malaise/fatigue.  HENT: Negative for sore throat and tinnitus.   Eyes: Negative for blurred vision and double vision.  Respiratory: Negative for cough, shortness of breath and wheezing.   Cardiovascular: Negative for chest pain, palpitations, orthopnea and leg swelling.  Gastrointestinal: Negative for heartburn, nausea, vomiting, abdominal pain, diarrhea, constipation and blood in stool.  Genitourinary: Negative for dysuria, urgency, frequency and hematuria.  Musculoskeletal: Positive for joint pain.  Negative for myalgias and falls.  Skin: Negative for rash.  Neurological: Negative for dizziness, tingling, tremors, sensory change, focal weakness, seizures, loss of consciousness, weakness and headaches.  Endo/Heme/Allergies: Negative for  environmental allergies. Does not bruise/bleed easily.  Psychiatric/Behavioral: Negative for depression and memory loss. The patient is not nervous/anxious and does not have insomnia.       Objective:     Vision by Snellen chart: right eye:20/70, left eye:20/50 without correction; OD 20/25 and OS 20/25 with correction  Body mass index is 28.88 kg/(m^2). BP 116/74 mmHg  Pulse 58  Temp(Src) 97.4 F (36.3 C) (Oral)  Resp 20  Ht 5\' 3"  (1.6 m)  Wt 163 lb (73.936 kg)  BMI 28.88 kg/m2  SpO2 98%  Physical Exam  Constitutional: She is oriented to person, place, and time and well-developed, well-nourished, and in no distress.  Voice is chronically hoarse  HENT:  Head: Normocephalic and atraumatic.  Right Ear: External ear normal.  Left Ear: External ear normal.  Mouth/Throat: Oropharynx is clear and moist. No oropharyngeal exudate.  Eyes: Conjunctivae and EOM are normal. Pupils are equal, round, and reactive to light. No scleral icterus.  Neck: Normal range of motion. Neck supple. Carotid bruit is present (right). No tracheal deviation present. No thyromegaly present.  Cardiovascular: Normal rate, regular rhythm and intact distal pulses.  Exam reveals no gallop and no friction rub.   Murmur (2/6 SEM at base) heard. Aortic click noted. No LE edema b/l. No calf TTP.  Pulmonary/Chest: Effort normal and breath sounds normal. She has no wheezes. She has no rhonchi. She has no rales. She exhibits no mass and no tenderness. Right breast exhibits no inverted nipple, no mass, no nipple discharge, no skin change and no tenderness. Breasts are asymmetrical (left breast implant. no nipple present).  Abdominal: Soft. Bowel sounds are normal. She exhibits no  distension and no mass. There is no hepatosplenomegaly. There is no tenderness. There is no rebound and no guarding.  Genitourinary:  Deferred due to age. She has no c/o  Musculoskeletal: Normal range of motion. She exhibits edema and tenderness.  Lymphadenopathy:    She has no cervical adenopathy.  Neurological: She is alert and oriented to person, place, and time. She has normal reflexes. Gait normal.  Skin: Skin is warm and dry. No rash noted.  Psychiatric: Mood, memory, affect and judgment normal.        MMSE - Mini Mental State Exam 11/27/2014  Orientation to time 5  Orientation to Place 5  Registration 3  Attention/ Calculation 4  Recall 2  Language- name 2 objects 2  Language- repeat 1  Language- follow 3 step command 3  Language- read & follow direction 1  Write a sentence 1  Copy design 1  Total score 28     Assessment:       ICD-9-CM ICD-10-CM   1. Well adult exam V70.0 Z00.00   2. Essential hypertension 401.9 I10 CMP  3. PAF (paroxysmal atrial fibrillation) 427.31 I48.0   4. S/P aortic valve replacement with bioprosthetic valve V42.2 Z95.4   5. Laryngeal dystonia 478.79 J38.3   6. Chronic obstructive pulmonary disease, unspecified COPD, unspecified chronic bronchitis type 496 J44.9   7. CKD (chronic kidney disease) stage 3, GFR 30-59 ml/min 585.3 N18.3 CMP  8. Gastroesophageal reflux disease without esophagitis 530.81 K21.9   9. Other seasonal allergic rhinitis 477.8 J30.2   10. Overactive bladder 596.51 N32.81   11. Osteoporosis 733.00 M81.0   12. Need for prophylactic vaccination and inoculation against influenza V04.81 Z23 Flu Vaccine QUAD 36+ mos PF IM (Fluarix & Fluzone Quad PF)   CBC Latest Ref Rng 05/22/2014 04/11/2013 09/06/2012  WBC 3.4 -  10.8 x10E3/uL 6.9 6.8 9.0  Hemoglobin 11.1 - 15.9 g/dL 12.6 11.5 10.1(L)  Hematocrit 34.0 - 46.6 % 37.4 34.7 29.8(L)  Platelets 150 - 379 x10E3/uL 218 - 248    CMP Latest Ref Rng 05/22/2014 05/30/2013 04/11/2013   Glucose 65 - 99 mg/dL 90 82 91  BUN 8 - 27 mg/dL 15 15 17   Creatinine 0.57 - 1.00 mg/dL 1.15(H) 1.09(H) 1.01(H)  Sodium 134 - 144 mmol/L 142 139 140  Potassium 3.5 - 5.2 mmol/L 4.0 4.4 4.2  Chloride 97 - 108 mmol/L 102 97 102  CO2 18 - 29 mmol/L 25 25 22   Calcium 8.7 - 10.3 mg/dL 10.0 9.3 9.1  Total Protein 6.0 - 8.5 g/dL 6.1 - 5.9(L)  Albumin 3.5 - 4.7 g/dL 4.3 - 3.8  Total Bilirubin 0.0 - 1.2 mg/dL 0.5 - 0.2  Alkaline Phos 39 - 117 IU/L 63 - 64  AST 0 - 40 IU/L 18 - 18  ALT 0 - 32 IU/L 11 - 16    Lipid Panel     Component Value Date/Time   CHOL 144 05/22/2014 0842   CHOL 156 07/11/2012 0905   TRIG 83 05/22/2014 0842   HDL 57 05/22/2014 0842   HDL 41 07/11/2012 0905   CHOLHDL 2.5 05/22/2014 0842   CHOLHDL 3.8 07/11/2012 0905   VLDL 31 07/11/2012 0905   LDLCALC 70 05/22/2014 0842   LDLCALC 84 07/11/2012 0905         Plan:     During the course of the visit the patient was educated and counseled about appropriate screening and preventive services including:    Influenza vaccine - today  Screening mammography - she prefers to wait until 08/2015  Colorectal cancer screening - prefers to wait until 04/2015 as she is due for colonoscopy then  Advanced directives: has NO advanced directive  - add't info requested. Referral to SW: no  Air traffic controller given today  Diet review for nutrition referral? Yes ____  Not Indicated _X___  Patient Instructions (the written plan) was given to the patient.  Medicare Attestation I have personally reviewed: The patient's medical and social history Their use of alcohol, tobacco or illicit drugs Their current medications and supplements The patient's functional ability including ADLs,fall risks, home safety risks, cognitive, and hearing and visual impairment Diet and physical activities Evidence for depression or mood disorders  The patient's weight, height, BMI, and visual acuity have been recorded in the chart.  I have made  referrals, counseling, and provided education to the patient based on review of the above and I have provided the patient with a written personalized care plan for preventive services.     Gildardo Cranker, DO   11/27/2014   Pt is UTD on health maintenance. Vaccinations are UTD. Influenza vaccine given today. Pt maintains a healthy lifestyle. Encouraged pt to exercise 30-45 minutes 4-5 times per week. Eat a well balanced diet. Avoid smoking. Limit alcohol intake. Wear seatbelt when riding in the car. Wear sun block (SPF >50) when spending extended times outside.  Continue current medications as ordered  Prefers to wait for PFTs  Follow up in 4 mos for routine visit  Tavarius Grewe S. Perlie Gold  [facility] [address] [place], [place] [address] [phone] Cell (Monday-Friday 8 AM - 5 PM) [phone] After 5 PM and follow prompts

## 2014-11-27 NOTE — Patient Instructions (Signed)
Encouraged her to exercise 30-45 minutes 4-5 times per week. Eat a well balanced diet. Avoid smoking. Limit alcohol intake. Wear seatbelt when riding in the car. Wear sun block (SPF >50) when spending extended times outside.  Continue current medications as ordered  Will call with lab results  Follow up in 4 mos for routine visit

## 2014-12-02 ENCOUNTER — Other Ambulatory Visit: Payer: Medicare Other

## 2014-12-02 DIAGNOSIS — N3946 Mixed incontinence: Secondary | ICD-10-CM | POA: Diagnosis not present

## 2014-12-02 DIAGNOSIS — N183 Chronic kidney disease, stage 3 unspecified: Secondary | ICD-10-CM

## 2014-12-02 DIAGNOSIS — I10 Essential (primary) hypertension: Secondary | ICD-10-CM

## 2014-12-02 DIAGNOSIS — R350 Frequency of micturition: Secondary | ICD-10-CM | POA: Diagnosis not present

## 2014-12-03 LAB — COMPREHENSIVE METABOLIC PANEL
ALT: 15 IU/L (ref 0–32)
AST: 19 IU/L (ref 0–40)
Albumin/Globulin Ratio: 1.9 (ref 1.1–2.5)
Albumin: 3.9 g/dL (ref 3.5–4.7)
Alkaline Phosphatase: 60 IU/L (ref 39–117)
BUN/Creatinine Ratio: 16 (ref 11–26)
BUN: 15 mg/dL (ref 8–27)
Bilirubin Total: 0.3 mg/dL (ref 0.0–1.2)
CO2: 25 mmol/L (ref 18–29)
Calcium: 9.5 mg/dL (ref 8.7–10.3)
Chloride: 103 mmol/L (ref 97–108)
Creatinine, Ser: 0.96 mg/dL (ref 0.57–1.00)
GFR calc Af Amer: 64 mL/min/{1.73_m2} (ref >59)
GFR calc non Af Amer: 56 mL/min/{1.73_m2} — ABNORMAL LOW (ref >59)
Globulin, Total: 2.1 g/dL (ref 1.5–4.5)
Glucose: 97 mg/dL (ref 65–99)
Potassium: 4.6 mmol/L (ref 3.5–5.2)
Sodium: 143 mmol/L (ref 134–144)
Total Protein: 6 g/dL (ref 6.0–8.5)

## 2015-01-03 ENCOUNTER — Other Ambulatory Visit (HOSPITAL_BASED_OUTPATIENT_CLINIC_OR_DEPARTMENT_OTHER): Payer: Self-pay | Admitting: Internal Medicine

## 2015-01-06 ENCOUNTER — Other Ambulatory Visit: Payer: Self-pay | Admitting: Internal Medicine

## 2015-01-17 DIAGNOSIS — J385 Laryngeal spasm: Secondary | ICD-10-CM | POA: Diagnosis not present

## 2015-02-20 ENCOUNTER — Encounter: Payer: Self-pay | Admitting: Internal Medicine

## 2015-04-02 ENCOUNTER — Ambulatory Visit: Payer: Medicare Other | Admitting: Internal Medicine

## 2015-04-10 DIAGNOSIS — H02831 Dermatochalasis of right upper eyelid: Secondary | ICD-10-CM | POA: Diagnosis not present

## 2015-04-10 DIAGNOSIS — H43813 Vitreous degeneration, bilateral: Secondary | ICD-10-CM | POA: Diagnosis not present

## 2015-04-16 ENCOUNTER — Ambulatory Visit: Payer: Medicare Other | Admitting: Internal Medicine

## 2015-04-22 DIAGNOSIS — Z0000 Encounter for general adult medical examination without abnormal findings: Secondary | ICD-10-CM | POA: Diagnosis not present

## 2015-04-22 DIAGNOSIS — R350 Frequency of micturition: Secondary | ICD-10-CM | POA: Diagnosis not present

## 2015-04-29 ENCOUNTER — Other Ambulatory Visit: Payer: Self-pay | Admitting: Cardiovascular Disease

## 2015-04-29 NOTE — Telephone Encounter (Signed)
REFILL 

## 2015-05-01 ENCOUNTER — Other Ambulatory Visit: Payer: Self-pay | Admitting: Cardiovascular Disease

## 2015-05-01 NOTE — Telephone Encounter (Signed)
REFILL 

## 2015-05-14 ENCOUNTER — Ambulatory Visit (INDEPENDENT_AMBULATORY_CARE_PROVIDER_SITE_OTHER): Payer: Medicare Other | Admitting: Internal Medicine

## 2015-05-14 ENCOUNTER — Encounter: Payer: Self-pay | Admitting: Internal Medicine

## 2015-05-14 ENCOUNTER — Ambulatory Visit: Payer: Medicare Other | Admitting: Internal Medicine

## 2015-05-14 VITALS — BP 122/82 | HR 63 | Temp 98.0°F | Resp 20 | Ht 63.0 in | Wt 169.4 lb

## 2015-05-14 DIAGNOSIS — Z954 Presence of other heart-valve replacement: Secondary | ICD-10-CM

## 2015-05-14 DIAGNOSIS — I10 Essential (primary) hypertension: Secondary | ICD-10-CM

## 2015-05-14 DIAGNOSIS — I480 Paroxysmal atrial fibrillation: Secondary | ICD-10-CM

## 2015-05-14 DIAGNOSIS — Z1211 Encounter for screening for malignant neoplasm of colon: Secondary | ICD-10-CM

## 2015-05-14 DIAGNOSIS — N3281 Overactive bladder: Secondary | ICD-10-CM

## 2015-05-14 DIAGNOSIS — J449 Chronic obstructive pulmonary disease, unspecified: Secondary | ICD-10-CM | POA: Diagnosis not present

## 2015-05-14 DIAGNOSIS — K219 Gastro-esophageal reflux disease without esophagitis: Secondary | ICD-10-CM | POA: Diagnosis not present

## 2015-05-14 DIAGNOSIS — J383 Other diseases of vocal cords: Secondary | ICD-10-CM

## 2015-05-14 DIAGNOSIS — Z953 Presence of xenogenic heart valve: Secondary | ICD-10-CM

## 2015-05-14 NOTE — Patient Instructions (Signed)
Continue current medications as ordered  Will call with colonoscopy referral  Return to office for fasting labs in 2-3 weeks  Follow up in 4 mos for routine visit

## 2015-05-14 NOTE — Progress Notes (Signed)
Patient ID: [name], female   DOB: 1931/12/12, 82 y.o.   MRN: [id number]    Location:    [facility]   Place of Service:   OFFICE  Chief Complaint  Patient presents with  . Medical Management of Chronic Issues    4 month follow-up for routine visit    HPI:  82 yo female seen today for f/u  She gets botox injections into vocal cord for dystonia. She is followed by Dr Joya Gaskins at [facility]. she has an appt soon.    Allergic rhinitis on flonase/loratidine is stable  COPD on symbicort, spiriva and daliresp. Uses HFA prn. She has not had PFTs in several yrs  HTN on metoprolol. BP controlled  GERD on prilosec is stable  OAB on myrbetriq and is followed by urology, Dr Vikki Ports. She has stress incontinence especially when she coughs  PAF on metoprolol. Rate is controlled. She sees Dr Gwenlyn Found for cardiology. She had a carotid study that showed stable stenosis. 2D echo no change in bioprosthesis and no significant stenosis. EF 60-65%. She has a hx AV replacement. She does not take anticoagulation.  Past Medical History  Diagnosis Date  . Arthritis   . Hyperlipidemia   . Heart murmur   . Hearing loss   . Change in voice   . Osteoporosis   . Cancer ([place])     Breast  . Aortic valve disorder 08/13/2011    ECHO - EF 123456; mild diastolic dysfunction; calcified aortic valve, not well visualized; mod/severe aortic stenosis w/ worsening gradients when compared to 2012  . PVD (peripheral vascular disease) ([place]) 08/13/2010    R/P MV - normal pattern of perfusion in all regions, EF 76%; no significant wall abnormalities noted; normal perfusion study  . Carotid bruit 08/06/2008    Doppler - R ECA demonstrates noarrowing w/ elevated velocities consistent w/ >70% diameter reduction; R and L ICAs show no evidence of diameter reduction, significant tortuosity or vascular abnormality;   . Aortic stenosis 07/20/2012    Aortic stenosis   . Hypertension     dr Gwenlyn Found  . S/P aortic valve replacement  with bioprosthetic valve 08/30/2012    21 mm [facility] Ease bovine pericardial tissue valve  . PAF (paroxysmal atrial fibrillation) ([place]) 09/04/2012  . COPD (chronic obstructive pulmonary disease) ([place])   . Right carotid bruit     high-grade right external carotid artery stenosis by 2 parts ultrasound 2 years ago    Past Surgical History  Procedure Laterality Date  . Breast lumpectomy  02/25/1997    right  . Mastectomy  09/29/95    left  . Total hip arthroplasty  09/2010  . Colon surgery  1959  . Abdominal hysterectomy      Partial  . Eye surgery  1997    Cataract surgery  . Biopsy shoulder Left 10/08/2005    shave biopsy  . Cosmetic surgery Left 1997    Breast implant  . Left heart cath  08/14/12    Nl cors, AS  . Cardiac catheterization    . Aortic valve replacement N/A 08/30/2012    Procedure: AORTIC VALVE REPLACEMENT (AVR);  Surgeon: Rexene Alberts, MD;  Location: [facility];  Service: Open Heart Surgery;  Laterality: N/A;  . Intraoperative transesophageal echocardiogram N/A 08/30/2012    Procedure: INTRAOPERATIVE TRANSESOPHAGEAL ECHOCARDIOGRAM;  Surgeon: Rexene Alberts, MD;  Location: [place];  Service: Open Heart Surgery;  Laterality: N/A;  . Partial hysterectomy    . Left and  right heart catheterization with coronary angiogram N/A 08/14/2012    Procedure: LEFT AND RIGHT HEART CATHETERIZATION WITH CORONARY ANGIOGRAM;  Surgeon: Lorretta Harp, MD;  Location: [facility] CATH LAB;  Service: Cardiovascular;  Laterality: N/A;    Patient Care Team: Gildardo Cranker, DO as PCP - General (Internal Medicine) Druscilla Brownie, MD as Consulting Physician (Dermatology) [name] Irani, MD as Consulting Physician (Gastroenterology) Neldon Mc, MD as Consulting Physician (General Surgery) Lorretta Harp, MD as Consulting Physician (Cardiology) Bjorn Loser, MD as Consulting Physician (Urology)  Social History   Social History  . Marital Status: Married    Spouse Name: N/A  . Number of  Children: N/A  . Years of Education: N/A   Occupational History  . Not on file.   Social History Main Topics  . Smoking status: Never Smoker   . Smokeless tobacco: Never Used  . Alcohol Use: No  . Drug Use: No  . Sexual Activity: Not on file   Other Topics Concern  . Not on file   Social History Narrative     reports that she has never smoked. She has never used smokeless tobacco. She reports that she does not drink alcohol or use illicit drugs.  Allergies  Allergen Reactions  . Codeine Rash    All over the body.    Medications: Patient's Medications  New Prescriptions   No medications on file  Previous Medications   ACETAMINOPHEN (TYLENOL) 325 MG TABLET    Take 2 tablets (650 mg total) by mouth every 6 (six) hours as needed.   BIOTIN 5000 MCG CAPS    Take 1 capsule by mouth daily.   BUDESONIDE-FORMOTEROL (SYMBICORT) 160-4.5 MCG/ACT INHALER    Inhale 2 puffs into the lungs 2 (two) times daily.   CALCIUM CITRATE-VITAMIN D (CITRACAL + D PO)    Take 1 tablet by mouth 2 (two) times daily.    FLUTICASONE (FLONASE) 50 MCG/ACT NASAL SPRAY    Place 1 spray into both nostrils 2 (two) times daily. 1 spray twice daily   HYDROXYZINE (ATARAX/VISTARIL) 25 MG TABLET       LORATADINE (CLARITIN) 10 MG TABLET    Take 1 tablet (10 mg total) by mouth daily.   MAGNESIUM HYDROXIDE (PHILLIPS MILK OF MAGNESIA PO)    Take 15 mL by mouth. Take daily at 6 o'clock at night   MELATONIN 5 MG TABS    Take by mouth. Take one tablet by mouth at bedtime as needed for rest   METOPROLOL TARTRATE (LOPRESSOR) 25 MG TABLET    TAKE 1 TABLET (25 MG TOTAL) BY MOUTH 2 (TWO) TIMES DAILY.   MIRABEGRON ER (MYRBETRIQ) 50 MG TB24 TABLET    Take 50 mg by mouth daily.   OMEPRAZOLE (PRILOSEC) 20 MG CAPSULE    TAKE 2 CAPSULES BY MOUTH DAILY FOR REFLUX   PROAIR HFA 108 (90 BASE) MCG/ACT INHALER    INHALE 1 PUFF INTO THE LUNGS EVERY 6 (SIX) HOURS AS NEEDED FOR WHEEZING OR SHORTNESS OF BREATH.   ROFLUMILAST (DALIRESP) 500 MCG  TABS TABLET    Take 500 mcg by mouth daily.   SHARK LIVER OIL-COCOA BUTTER (PREPARATION H) 0.25-3-85.5 % SUPPOSITORY    Place 1 suppository rectally as needed.   SODIUM CHLORIDE (OCEAN) 0.65 % SOLN NASAL SPRAY    Place 1 spray into both nostrils as needed for congestion.   TIOTROPIUM (SPIRIVA) 18 MCG INHALATION CAPSULE    Place 1 capsule (18 mcg total) into inhaler and inhale daily.   VITAMIN  E 400 UNIT CAPSULE    Take 400 Units by mouth daily.  Modified Medications   No medications on file  Discontinued Medications   No medications on file    Review of Systems  Constitutional: Negative for fever, chills, diaphoresis, activity change, appetite change and fatigue.  HENT: Positive for voice change. Negative for ear pain and sore throat.   Eyes: Negative for visual disturbance.  Respiratory: Negative for cough, chest tightness and shortness of breath.   Cardiovascular: Negative for chest pain, palpitations and leg swelling.  Gastrointestinal: Negative for nausea, vomiting, abdominal pain, diarrhea, constipation and blood in stool.  Genitourinary: Positive for frequency. Negative for dysuria.  Musculoskeletal: Negative for arthralgias.  Neurological: Negative for dizziness, tremors, numbness and headaches.  Psychiatric/Behavioral: Negative for sleep disturbance. The patient is not nervous/anxious.     Filed Vitals:   05/14/15 1605  BP: 122/82  Pulse: 63  Temp: 98 F (36.7 C)  TempSrc: Oral  Resp: 20  Height: 5\' 3"  (1.6 m)  Weight: 169 lb 6.4 oz (76.839 kg)  SpO2: 96%   Body mass index is 30.02 kg/(m^2).  Physical Exam  Constitutional: She is oriented to person, place, and time. She appears well-developed and well-nourished.  HENT:  Mouth/Throat: Oropharynx is clear and moist. No oropharyngeal exudate.  Strangled voice quality  Eyes: Pupils are equal, round, and reactive to light. No scleral icterus.  Neck: Neck supple. Carotid bruit is present (systolic b/l from chest). No  tracheal deviation present.  Cardiovascular: Normal rate, regular rhythm and intact distal pulses.  Exam reveals no gallop and no friction rub.   Murmur (1/6 SEM) heard. No LE edema b/l. no calf TTP.   Pulmonary/Chest: Effort normal and breath sounds normal. No stridor. No respiratory distress. She has no wheezes. She has no rales.  Abdominal: Soft. Bowel sounds are normal. She exhibits no distension and no mass. There is no hepatomegaly. There is no tenderness. There is no rebound and no guarding.  Musculoskeletal: She exhibits edema.  Lymphadenopathy:    She has no cervical adenopathy.  Neurological: She is alert and oriented to person, place, and time. She has normal reflexes.  Skin: Skin is warm and dry. No rash noted.  Psychiatric: She has a normal mood and affect. Her behavior is normal. Judgment and thought content normal.     Labs reviewed: No visits with results within 3 Month(s) from this visit. Latest known visit with results is:  Appointment on 12/02/2014  Component Date Value Ref Range Status  . Glucose 12/02/2014 97  65 - 99 mg/dL Final  . BUN 12/02/2014 15  8 - 27 mg/dL Final  . Creatinine, Ser 12/02/2014 0.96  0.57 - 1.00 mg/dL Final  . GFR calc non Af Amer 12/02/2014 56* >59 mL/min/1.73 Final  . GFR calc Af Amer 12/02/2014 64  >59 mL/min/1.73 Final  . BUN/Creatinine Ratio 12/02/2014 16  11 - 26 Final  . Sodium 12/02/2014 143  134 - 144 mmol/L Final   Comment: **Effective December 16, 2014 the reference interval**   for Sodium, Serum will be changing to:                                             136 - 144   . Potassium 12/02/2014 4.6  3.5 - 5.2 mmol/L Final   Comment: **Effective December 16, 2014 the reference interval**  for Potassium, Serum will be changing to:                          0 -  7 days        3.7 - 5.2                          8 - 30 days        3.7 - 6.4                          1 -  6 months      3.8 - 6.0                   7 months -  1 year         3.8 - 5.3                              >1 year        3.5 - 5.2   . Chloride 12/02/2014 103  97 - 108 mmol/L Final   Comment: **Effective December 16, 2014 the reference interval**   for Chloride, Serum will be changing to:                                              97 - 106   . CO2 12/02/2014 25  18 - 29 mmol/L Final  . Calcium 12/02/2014 9.5  8.7 - 10.3 mg/dL Final  . Total Protein 12/02/2014 6.0  6.0 - 8.5 g/dL Final  . Albumin 12/02/2014 3.9  3.5 - 4.7 g/dL Final  . Globulin, Total 12/02/2014 2.1  1.5 - 4.5 g/dL Final  . Albumin/Globulin Ratio 12/02/2014 1.9  1.1 - 2.5 Final  . Bilirubin Total 12/02/2014 0.3  0.0 - 1.2 mg/dL Final  . Alkaline Phosphatase 12/02/2014 60  39 - 117 IU/L Final  . AST 12/02/2014 19  0 - 40 IU/L Final  . ALT 12/02/2014 15  0 - 32 IU/L Final    No results found.   Assessment/Plan   ICD-9-CM ICD-10-CM   1. Colon cancer screening V76.51 Z12.11 Ambulatory referral to Gastroenterology  2. Laryngeal dystonia 478.79 J38.3   3. Essential hypertension 401.9 I10   4. PAF (paroxysmal atrial fibrillation) (HCC) 427.31 I48.0   5. S/P aortic valve replacement with bioprosthetic valve V42.2 Z95.4   6. Chronic obstructive pulmonary disease, unspecified COPD, unspecified chronic bronchitis type 496 J44.9   7. Gastroesophageal reflux disease without esophagitis 530.81 K21.9   8. Overactive bladder 596.51 N32.81     Prefers to hold off on repeating PFTs at this time  Continue current medications as ordered  Will call with colonoscopy referral  Return to office for fasting labs in 2-3 weeks (bmp, alt, lipid panel, cbc w diff)  Follow up in 4 mos for routine visit  Alizandra Loh S. Perlie Gold  [facility] [address] [place], [place] [address] [phone] Cell (Monday-Friday 8 AM - 5 PM) [phone] After 5 PM and follow prompts

## 2015-05-23 DIAGNOSIS — J385 Laryngeal spasm: Secondary | ICD-10-CM | POA: Diagnosis not present

## 2015-05-30 ENCOUNTER — Other Ambulatory Visit: Payer: Medicare Other

## 2015-05-30 DIAGNOSIS — I480 Paroxysmal atrial fibrillation: Secondary | ICD-10-CM | POA: Diagnosis not present

## 2015-05-30 DIAGNOSIS — I10 Essential (primary) hypertension: Secondary | ICD-10-CM | POA: Diagnosis not present

## 2015-05-30 DIAGNOSIS — Z953 Presence of xenogenic heart valve: Secondary | ICD-10-CM

## 2015-05-30 DIAGNOSIS — Z954 Presence of other heart-valve replacement: Secondary | ICD-10-CM | POA: Diagnosis not present

## 2015-05-31 LAB — BASIC METABOLIC PANEL
BUN/Creatinine Ratio: 15 (ref 11–26)
BUN: 16 mg/dL (ref 8–27)
CO2: 24 mmol/L (ref 18–29)
Calcium: 9.9 mg/dL (ref 8.7–10.3)
Chloride: 102 mmol/L (ref 96–106)
Creatinine, Ser: 1.05 mg/dL — ABNORMAL HIGH (ref 0.57–1.00)
GFR calc Af Amer: 57 mL/min/{1.73_m2} — ABNORMAL LOW (ref >59)
GFR calc non Af Amer: 50 mL/min/{1.73_m2} — ABNORMAL LOW (ref >59)
Glucose: 99 mg/dL (ref 65–99)
Potassium: 5 mmol/L (ref 3.5–5.2)
Sodium: 142 mmol/L (ref 134–144)

## 2015-05-31 LAB — CBC WITH DIFFERENTIAL/PLATELET
Basophils Absolute: 0 10*3/uL (ref 0.0–0.2)
Basos: 1 %
EOS (ABSOLUTE): 0.2 10*3/uL (ref 0.0–0.4)
Eos: 4 %
Hematocrit: 35.9 % (ref 34.0–46.6)
Hemoglobin: 11.9 g/dL (ref 11.1–15.9)
Immature Grans (Abs): 0 10*3/uL (ref 0.0–0.1)
Immature Granulocytes: 0 %
Lymphocytes Absolute: 1.8 10*3/uL (ref 0.7–3.1)
Lymphs: 31 %
MCH: 31.2 pg (ref 26.6–33.0)
MCHC: 33.1 g/dL (ref 31.5–35.7)
MCV: 94 fL (ref 79–97)
Monocytes Absolute: 0.5 10*3/uL (ref 0.1–0.9)
Monocytes: 9 %
Neutrophils Absolute: 3.2 10*3/uL (ref 1.4–7.0)
Neutrophils: 55 %
Platelets: 214 10*3/uL (ref 150–379)
RBC: 3.81 x10E6/uL (ref 3.77–5.28)
RDW: 13.5 % (ref 12.3–15.4)
WBC: 5.7 10*3/uL (ref 3.4–10.8)

## 2015-05-31 LAB — LIPID PANEL
Chol/HDL Ratio: 2.8 ratio units (ref 0.0–4.4)
Cholesterol, Total: 152 mg/dL (ref 100–199)
HDL: 54 mg/dL (ref >39)
LDL Calculated: 78 mg/dL (ref 0–99)
Triglycerides: 99 mg/dL (ref 0–149)
VLDL Cholesterol Cal: 20 mg/dL (ref 5–40)

## 2015-05-31 LAB — ALT: ALT: 13 IU/L (ref 0–32)

## 2015-06-03 ENCOUNTER — Other Ambulatory Visit: Payer: Self-pay | Admitting: Internal Medicine

## 2015-06-03 NOTE — Telephone Encounter (Signed)
Patient called for medication refill it was sent to her pharmacy. ( Hydroxyzine )

## 2015-06-17 ENCOUNTER — Encounter: Payer: Self-pay | Admitting: Internal Medicine

## 2015-06-17 ENCOUNTER — Ambulatory Visit (INDEPENDENT_AMBULATORY_CARE_PROVIDER_SITE_OTHER): Payer: Medicare Other | Admitting: Internal Medicine

## 2015-06-17 VITALS — BP 118/76 | HR 69 | Temp 97.8°F | Resp 17 | Ht 63.0 in | Wt 170.6 lb

## 2015-06-17 DIAGNOSIS — J209 Acute bronchitis, unspecified: Secondary | ICD-10-CM

## 2015-06-17 MED ORDER — DM-GUAIFENESIN ER 30-600 MG PO TB12: ORAL_TABLET | ORAL | Status: DC

## 2015-06-17 MED ORDER — CETIRIZINE-PSEUDOEPHEDRINE ER 5-120 MG PO TB12: ORAL_TABLET | ORAL | Status: DC

## 2015-06-17 MED ORDER — AZITHROMYCIN 250 MG PO TABS: ORAL_TABLET | ORAL | Status: DC

## 2015-06-17 NOTE — Progress Notes (Signed)
Patient ID: [name], female   DOB: September 03, 1931, 82 y.o.   MRN: [id number]    Facility  [facility]    Place of Service:   OFFICE    Allergies  Allergen Reactions  . Codeine Rash    All over the body.    Chief Complaint  Patient presents with  . Acute Visit    Cold/Allergy symptoms x 1 week. Claritin is not helping.     HPI:  1. Acute bronchitis, unspecified organism Denies fever. Head and chest. Congested. Rattling cough. Denies difficulty breathing. Sinus pressure in maxillary and frontal sinuses. History of seasonal allergies with pollen allergies.    Medications: Patient's Medications  New Prescriptions   No medications on file  Previous Medications   ACETAMINOPHEN (TYLENOL) 325 MG TABLET    Take 2 tablets (650 mg total) by mouth every 6 (six) hours as needed.   BIOTIN 5000 MCG CAPS    Take 1 capsule by mouth daily.   BUDESONIDE-FORMOTEROL (SYMBICORT) 160-4.5 MCG/ACT INHALER    Inhale 2 puffs into the lungs 2 (two) times daily.   CALCIUM CITRATE-VITAMIN D (CITRACAL + D PO)    Take 1 tablet by mouth 2 (two) times daily.    FLUTICASONE (FLONASE) 50 MCG/ACT NASAL SPRAY    Place 1 spray into both nostrils 2 (two) times daily. 1 spray twice daily   HYDROXYZINE (ATARAX/VISTARIL) 25 MG TABLET       HYDROXYZINE (ATARAX/VISTARIL) 25 MG TABLET    TAKE 1 TABLET BY MOUTH AS NEEDED   LORATADINE (CLARITIN) 10 MG TABLET    Take 1 tablet (10 mg total) by mouth daily.   MAGNESIUM HYDROXIDE (PHILLIPS MILK OF MAGNESIA PO)    Take 15 mL by mouth. Take daily at 6 o'clock at night   MELATONIN 5 MG TABS    Take by mouth. Take one tablet by mouth at bedtime as needed for rest   METOPROLOL TARTRATE (LOPRESSOR) 25 MG TABLET    TAKE 1 TABLET (25 MG TOTAL) BY MOUTH 2 (TWO) TIMES DAILY.   MIRABEGRON ER (MYRBETRIQ) 50 MG TB24 TABLET    Take 50 mg by mouth daily.   OMEPRAZOLE (PRILOSEC) 20 MG CAPSULE    TAKE 2 CAPSULES BY MOUTH DAILY FOR REFLUX   PROAIR HFA 108 (90 BASE) MCG/ACT INHALER    INHALE 1  PUFF INTO THE LUNGS EVERY 6 (SIX) HOURS AS NEEDED FOR WHEEZING OR SHORTNESS OF BREATH.   ROFLUMILAST (DALIRESP) 500 MCG TABS TABLET    Take 500 mcg by mouth daily.   SHARK LIVER OIL-COCOA BUTTER (PREPARATION H) 0.25-3-85.5 % SUPPOSITORY    Place 1 suppository rectally as needed.   SODIUM CHLORIDE (OCEAN) 0.65 % SOLN NASAL SPRAY    Place 1 spray into both nostrils as needed for congestion.   TIOTROPIUM (SPIRIVA) 18 MCG INHALATION CAPSULE    Place 1 capsule (18 mcg total) into inhaler and inhale daily.   VITAMIN E 400 UNIT CAPSULE    Take 400 Units by mouth daily.  Modified Medications   No medications on file  Discontinued Medications   No medications on file    Review of Systems  Constitutional: Negative for fever, chills, diaphoresis, activity change, appetite change and fatigue.  HENT: Positive for postnasal drip, sore throat and voice change. Negative for ear pain.        Sinus pressure  Eyes: Negative for visual disturbance.  Respiratory: Positive for cough and chest tightness. Negative for shortness of breath.   Cardiovascular: Negative  for chest pain, palpitations and leg swelling.  Gastrointestinal: Negative for nausea, vomiting, abdominal pain, diarrhea, constipation and blood in stool.  Genitourinary: Positive for frequency. Negative for dysuria.  Musculoskeletal: Negative for arthralgias.  Neurological: Negative for dizziness, tremors, numbness and headaches.  Psychiatric/Behavioral: Negative for sleep disturbance. The patient is not nervous/anxious.     Filed Vitals:   06/17/15 1342  BP: 118/76  Pulse: 69  Temp: 97.8 F (36.6 C)  TempSrc: Oral  Resp: 17  Height: 5' 3"  (1.6 m)  Weight: 170 lb 9.6 oz (77.384 kg)  SpO2: 98%   Body mass index is 30.23 kg/(m^2). Filed Weights   06/17/15 1342  Weight: 170 lb 9.6 oz (77.384 kg)     Physical Exam  Constitutional: She is oriented to person, place, and time. She appears well-developed and well-nourished.  HENT:    Mouth/Throat: Oropharynx is clear and moist. No oropharyngeal exudate.  Strangled voice quality  Eyes: Pupils are equal, round, and reactive to light. No scleral icterus.  Neck: Neck supple. Carotid bruit is present (systolic b/l from chest). No tracheal deviation present. No thyromegaly present.  Cardiovascular: Normal rate, regular rhythm and intact distal pulses.  Exam reveals no gallop and no friction rub.   Murmur (1/6 SEM) heard. No LE edema b/l. no calf TTP.   Pulmonary/Chest: Effort normal. No stridor. No respiratory distress. She has no wheezes. She has rales.  Abdominal: Soft. Bowel sounds are normal. She exhibits no distension and no mass. There is no hepatomegaly. There is no tenderness. There is no rebound and no guarding.  Musculoskeletal: She exhibits edema.  Lymphadenopathy:    She has no cervical adenopathy.  Neurological: She is alert and oriented to person, place, and time. She has normal reflexes.  Skin: Skin is warm and dry. No rash noted.  Psychiatric: She has a normal mood and affect. Her behavior is normal. Judgment and thought content normal.    Labs reviewed: Lab Summary Latest Ref Rng 05/30/2015 12/02/2014 05/22/2014  Hemoglobin 11.1 - 15.9 g/dL 11.9 (None) 12.6  Hematocrit 34.0 - 46.6 % 35.9 (None) 37.4  White count 3.4 - 10.8 x10E3/uL 5.7 (None) 6.9  Platelet count 150 - 379 x10E3/uL 214 (None) 218  Sodium 134 - 144 mmol/L 142 143 142  Potassium 3.5 - 5.2 mmol/L 5.0 4.6 4.0  Calcium 8.7 - 10.3 mg/dL 9.9 9.5 10.0  Phosphorus - (None) (None) (None)  Creatinine 0.57 - 1.00 mg/dL 1.05(H) 0.96 1.15(H)  AST 0 - 40 IU/L (None) 19 18  Alk Phos 39 - 117 IU/L (None) 60 63  Bilirubin 0.0 - 1.2 mg/dL (None) 0.3 0.5  Glucose 65 - 99 mg/dL 99 97 90  Cholesterol - (None) (None) (None)  HDL cholesterol >39 mg/dL 54 (None) 57  Triglycerides 0 - 149 mg/dL 99 (None) 83  LDL Direct - (None) (None) (None)  LDL Calc 0 - 99 mg/dL 78 (None) 70  Total protein - (None) (None)  (None)  Albumin 3.5 - 4.7 g/dL (None) 3.9 4.3   Lab Results  Component Value Date   TSH 1.366 08/08/2012   Lab Results  Component Value Date   BUN 16 05/30/2015   BUN 15 12/02/2014   BUN 15 05/22/2014   Lab Results  Component Value Date   HGBA1C 5.5 08/28/2012    Assessment/Plan  1. Acute bronchitis, unspecified organism - azithromycin (ZITHROMAX) 250 MG tablet; 2 initially, then 1 daily for infection  Dispense: 6 tablet; Refill: 0 - dextromethorphan-guaiFENesin (MUCINEX DM) 30-600 MG  12hr tablet; One twice daily to help cough and congestion  Dispense: 20 tablet; Refill: 1 - cetirizine-pseudoephedrine (ZYRTEC-D) 5-120 MG tablet; One twice daily to help allergy and congestion  Dispense: 20 tablet; Refill: 1

## 2015-06-17 NOTE — Patient Instructions (Signed)
Stop Claritin while taking the prescribed medication.

## 2015-06-23 ENCOUNTER — Telehealth: Payer: Self-pay | Admitting: *Deleted

## 2015-06-23 NOTE — Telephone Encounter (Signed)
Advise patient that I think it is okay for her to take Zyrtec D.

## 2015-06-23 NOTE — Telephone Encounter (Signed)
Patient notified and agreed.  

## 2015-06-23 NOTE — Telephone Encounter (Signed)
Patient called and stated that she was told to take Zyrtec with D. Patient said that in the past a nurse told them to not take anything with a D in it due to having open heart surgery. Patient just wants to confirm it is ok to take. Please Advise.

## 2015-07-02 ENCOUNTER — Ambulatory Visit: Payer: Medicare Other | Admitting: Internal Medicine

## 2015-07-08 ENCOUNTER — Other Ambulatory Visit: Payer: Self-pay | Admitting: Cardiovascular Disease

## 2015-07-08 ENCOUNTER — Other Ambulatory Visit (HOSPITAL_BASED_OUTPATIENT_CLINIC_OR_DEPARTMENT_OTHER): Payer: Self-pay | Admitting: Internal Medicine

## 2015-07-23 ENCOUNTER — Other Ambulatory Visit: Payer: Self-pay | Admitting: Nurse Practitioner

## 2015-07-29 ENCOUNTER — Ambulatory Visit (INDEPENDENT_AMBULATORY_CARE_PROVIDER_SITE_OTHER): Payer: Medicare Other | Admitting: Cardiovascular Disease

## 2015-07-29 ENCOUNTER — Encounter: Payer: Self-pay | Admitting: *Deleted

## 2015-07-29 ENCOUNTER — Encounter: Payer: Self-pay | Admitting: Cardiovascular Disease

## 2015-07-29 VITALS — BP 162/72 | HR 64 | Ht 63.0 in | Wt 170.0 lb

## 2015-07-29 DIAGNOSIS — I350 Nonrheumatic aortic (valve) stenosis: Secondary | ICD-10-CM | POA: Diagnosis not present

## 2015-07-29 DIAGNOSIS — Z954 Presence of other heart-valve replacement: Secondary | ICD-10-CM

## 2015-07-29 DIAGNOSIS — I10 Essential (primary) hypertension: Secondary | ICD-10-CM | POA: Diagnosis not present

## 2015-07-29 DIAGNOSIS — Z952 Presence of prosthetic heart valve: Secondary | ICD-10-CM

## 2015-07-29 NOTE — Progress Notes (Signed)
07/29/2015 [name]   1931-12-21  [id number]  Primary Physician Gildardo Cranker, DO Primary Cardiologist: Lorretta Harp MD Renae Gloss   HPI:  The patient is a 82 year old mildly overweight married Caucasian female who I last saw 10/02/12.. She is a mother of 2, grandmother to 1 grandchild. I saw her a year ago .  Her risk factors include hypertension and family history. A brother died at age 95 from heart-related issues while he was being operated on. She has never had a heart attack or stroke. She does have moderate aortic stenosis by 2D echocardiogram last performed a year ago with a valve area of 0.83 cm2, peak gradient of 57, and mean of 35. She had negative Myoview on August 13, 2010. Since I saw her last, she developed exertional jaw pain, which was fairly reproducible. Her last lipid profile a year ago was excellent with total cholesterol 166, LDL 89, HDL 44. 2-D echo was performed showed critical aortic stenosis the valve area 0.5 cm. Based on thisthe patient underwent right left heart cardiac catheterization by myself revealing normal coronaries and normal left function. She had critical aortic stenosis and ultimately underwent bioprosthetic aortic valve replacement by Dr. Roxy Manns on 08/30/12 with an Edwards magna ease pericardial tissue valve (21 mm) excellent result. Her postop course was complicated by nausea and paroxysmal atrial fibrillation for which she was treated with low dose amiodarone and Coumadin anticoagulation. She currently has had no further episodes of PAF. Her Coumadin and amiodarone discontinued. Her followup 2-D echocardiogram performed in August of last year revealed normal LV function with a well-functioning aortic bioprosthesis. She denies chest pain but continues to have dyspnea on exertion.   Current Outpatient Prescriptions  Medication Sig Dispense Refill  . acetaminophen (TYLENOL) 325 MG tablet Take 2 tablets (650 mg total) by mouth every 6 (six)  hours as needed.    . Biotin 5000 MCG CAPS Take 1 capsule by mouth daily.    . budesonide-formoterol (SYMBICORT) 160-4.5 MCG/ACT inhaler Inhale 2 puffs into the lungs 2 (two) times daily. 1 Inhaler 3  . Calcium Citrate-Vitamin D (CITRACAL + D PO) Take 1 tablet by mouth 2 (two) times daily.     . [facility]S LORATADINE 10 MG tablet TAKE 1 TABLET (10 MG TOTAL) BY MOUTH DAILY. 30 tablet 5  . dextromethorphan-guaiFENesin (MUCINEX DM) 30-600 MG 12hr tablet One twice daily to help cough and congestion 20 tablet 1  . fluticasone (FLONASE) 50 MCG/ACT nasal spray Place 1 spray into both nostrils 2 (two) times daily. 1 spray twice daily 16 g 11  . hydrOXYzine (ATARAX/VISTARIL) 25 MG tablet TAKE 1 TABLET BY MOUTH AS NEEDED 30 tablet 5  . Magnesium Hydroxide (PHILLIPS MILK OF MAGNESIA PO) Take 15 mL by mouth. Take daily at 6 o'clock at night    . metoprolol tartrate (LOPRESSOR) 25 MG tablet TAKE 1 TABLET (25 MG TOTAL) BY MOUTH 2 (TWO) TIMES DAILY. 60 tablet 0  . mirabegron ER (MYRBETRIQ) 50 MG TB24 tablet Take 50 mg by mouth daily.    Marland Kitchen omeprazole (PRILOSEC) 20 MG capsule TAKE 2 CAPSULES BY MOUTH DAILY FOR REFLUX 60 capsule 5  . PROAIR HFA 108 (90 BASE) MCG/ACT inhaler INHALE 1 PUFF INTO THE LUNGS EVERY 6 (SIX) HOURS AS NEEDED FOR WHEEZING OR SHORTNESS OF BREATH. 8.5 Inhaler 2  . roflumilast (DALIRESP) 500 MCG TABS tablet Take 500 mcg by mouth daily.    . shark liver oil-cocoa butter (PREPARATION H) 0.25-3-85.5 % suppository Place  1 suppository rectally as needed.    . sodium chloride (OCEAN) 0.65 % SOLN nasal spray Place 1 spray into both nostrils as needed for congestion. 1 Bottle 1  . tiotropium (SPIRIVA) 18 MCG inhalation capsule Place 1 capsule (18 mcg total) into inhaler and inhale daily. 30 capsule 12  . vitamin E 400 UNIT capsule Take 400 Units by mouth daily.     No current facility-administered medications for this visit.    Allergies  Allergen Reactions  . Codeine Rash    All over the body.     Social History   Social History  . Marital Status: Married    Spouse Name: N/A  . Number of Children: N/A  . Years of Education: N/A   Occupational History  . Not on file.   Social History Main Topics  . Smoking status: Never Smoker   . Smokeless tobacco: Never Used  . Alcohol Use: No  . Drug Use: No  . Sexual Activity: Not on file   Other Topics Concern  . Not on file   Social History Narrative     Review of Systems: General: negative for chills, fever, night sweats or weight changes.  Cardiovascular: negative for chest pain, dyspnea on exertion, edema, orthopnea, palpitations, paroxysmal nocturnal dyspnea or shortness of breath Dermatological: negative for rash Respiratory: negative for cough or wheezing Urologic: negative for hematuria Abdominal: negative for nausea, vomiting, diarrhea, bright red blood per rectum, melena, or hematemesis Neurologic: negative for visual changes, syncope, or dizziness All other systems reviewed and are otherwise negative except as noted above.    Blood pressure 162/72, pulse 64, height 5\' 3"  (1.6 m), weight 170 lb (77.111 kg).  General appearance: alert and no distress Neck: no adenopathy, no JVD, supple, symmetrical, trachea midline, thyroid not enlarged, symmetric, no tenderness/mass/nodules and soft bilateral carotid bruits probably related to transmission of her aortic murmur Lungs: clear to auscultation bilaterally Heart: 2/6 systolic ejection murmur consistent with aortic stenosis. Extremities: extremities normal, atraumatic, no cyanosis or edema  EKG normal sinus rhythm at 64 with septal Q waves. I personally reviewed this EKG  ASSESSMENT AND PLAN:   HTN (hypertension) History of hypertension blood pressure measured at 162/72. She is on metoprolol Continue current meds at current dosing  Aortic stenosis- critical AS at cath 08/14/12, with surgery History of critical aortic stenosis status post aortic valve replacement  by Dr. Loetta Rough 08/30/12 with an Oletta Lamas magnesium pericardial tissue valve (21 mm) with an excellent result. Her most recent 2-D echo performed 10/15/14 revealed normal LV systolic function with a widely patent prosthetic valve with a valve area 1.24 cm2.she does continue to complain of some dyspnea on exertion and also uses inhaled bronchodilator.      Lorretta Harp MD FACP,FACC,FAHA, [facility] 07/29/2015 2:45 PM

## 2015-07-29 NOTE — Assessment & Plan Note (Signed)
History of critical aortic stenosis status post aortic valve replacement by Dr. Loetta Rough 08/30/12 with an Oletta Lamas magnesium pericardial tissue valve (21 mm) with an excellent result. Her most recent 2-D echo performed 10/15/14 revealed normal LV systolic function with a widely patent prosthetic valve with a valve area 1.24 cm2.she does continue to complain of some dyspnea on exertion and also uses inhaled bronchodilator.

## 2015-07-29 NOTE — Assessment & Plan Note (Signed)
History of hypertension blood pressure measured at 162/72. She is on metoprolol Continue current meds at current dosing

## 2015-07-29 NOTE — Patient Instructions (Signed)
Medication Instructions:  Your physician recommends that you continue on your current medications as directed. Please refer to the Current Medication list given to you today.   Labwork: none  Testing/Procedures: Your physician has requested that you have an echocardiogram. Echocardiography is a painless test that uses sound waves to create images of your heart. It provides your doctor with information about the size and shape of your heart and how well your heart's chambers and valves are working. This procedure takes approximately one hour. There are no restrictions for this procedure. IN AUGUST 2017.    Follow-Up: Your physician wants you to follow-up in: [place]. You will receive a reminder letter in the mail two months in advance. If you don't receive a letter, please call our office to schedule the follow-up appointment.      If you need a refill on your cardiac medications before your next appointment, please call your pharmacy.

## 2015-08-04 ENCOUNTER — Telehealth: Payer: Self-pay | Admitting: Cardiovascular Disease

## 2015-08-04 NOTE — Telephone Encounter (Signed)
Returned call to patient's daughter [name].She stated she wanted Dr.Berry to send a letter to PCP Gildardo Cranker DO stating mother should not use  decongestants.She request copy of that letter to be mailed to mother.She also request a copy of 07/29/15 letter that was sent to [facility] about colonoscopy be mailed to mother too.Advised Dr.Berry's nurse Anderson Malta is out of office today, I will send message to her.

## 2015-08-04 NOTE — Telephone Encounter (Signed)
New Message  Pt daughter request a call back to discuss if a latter can be sent to the PCP to inform them not to prescribe the Zertec because Dr. Gwenlyn Found recommeneded that the pt should not take this medication. Pt daughter is requesting a call back first.

## 2015-08-05 NOTE — Telephone Encounter (Signed)
How do you advise this pt in the use of decongestants?

## 2015-08-06 ENCOUNTER — Encounter: Payer: Self-pay | Admitting: *Deleted

## 2015-08-06 NOTE — Telephone Encounter (Signed)
Spoke with Dr Gwenlyn Found. He advised patient should not use anything with decongestants. Will call pt's daughter.

## 2015-08-06 NOTE — Telephone Encounter (Signed)
F/u  Pt dtr had additional questions. Please call back and discuss.

## 2015-08-06 NOTE — Telephone Encounter (Signed)
Returned call to pt's daughter. Gave her Dr. Kennon Holter advice that patient should not take decongestant medications. She asked for a letter stating this be sent to the patient along with the clearance for the colonoscopy. These documents were printed and mailed to the patient as requested.

## 2015-08-06 NOTE — Telephone Encounter (Signed)
Attempted to call pt's daughter back. No answer. Left message to call back.

## 2015-08-21 DIAGNOSIS — N3944 Nocturnal enuresis: Secondary | ICD-10-CM | POA: Diagnosis not present

## 2015-08-21 DIAGNOSIS — N3946 Mixed incontinence: Secondary | ICD-10-CM | POA: Diagnosis not present

## 2015-09-05 DIAGNOSIS — Z96649 Presence of unspecified artificial hip joint: Secondary | ICD-10-CM | POA: Diagnosis not present

## 2015-09-05 DIAGNOSIS — J385 Laryngeal spasm: Secondary | ICD-10-CM | POA: Diagnosis not present

## 2015-09-05 DIAGNOSIS — M25521 Pain in right elbow: Secondary | ICD-10-CM | POA: Diagnosis not present

## 2015-09-08 ENCOUNTER — Other Ambulatory Visit: Payer: Self-pay | Admitting: *Deleted

## 2015-09-08 MED ORDER — METOPROLOL TARTRATE 25 MG PO TABS: 25.0000 mg | ORAL_TABLET | Freq: Two times a day (BID) | ORAL | Status: DC

## 2015-09-18 DIAGNOSIS — K5730 Diverticulosis of large intestine without perforation or abscess without bleeding: Secondary | ICD-10-CM | POA: Diagnosis not present

## 2015-09-18 DIAGNOSIS — Z86010 Personal history of colonic polyps: Secondary | ICD-10-CM | POA: Diagnosis not present

## 2015-09-18 LAB — HM COLONOSCOPY

## 2015-09-24 ENCOUNTER — Ambulatory Visit (INDEPENDENT_AMBULATORY_CARE_PROVIDER_SITE_OTHER): Payer: Medicare Other | Admitting: Internal Medicine

## 2015-09-24 ENCOUNTER — Encounter: Payer: Self-pay | Admitting: Internal Medicine

## 2015-09-24 VITALS — BP 124/68 | HR 60 | Temp 98.3°F | Ht 63.0 in | Wt 168.6 lb

## 2015-09-24 DIAGNOSIS — I10 Essential (primary) hypertension: Secondary | ICD-10-CM | POA: Diagnosis not present

## 2015-09-24 DIAGNOSIS — Z954 Presence of other heart-valve replacement: Secondary | ICD-10-CM

## 2015-09-24 DIAGNOSIS — I480 Paroxysmal atrial fibrillation: Secondary | ICD-10-CM

## 2015-09-24 DIAGNOSIS — Z953 Presence of xenogenic heart valve: Secondary | ICD-10-CM

## 2015-09-24 DIAGNOSIS — J449 Chronic obstructive pulmonary disease, unspecified: Secondary | ICD-10-CM

## 2015-09-24 DIAGNOSIS — K5730 Diverticulosis of large intestine without perforation or abscess without bleeding: Secondary | ICD-10-CM | POA: Diagnosis not present

## 2015-09-24 DIAGNOSIS — J383 Other diseases of vocal cords: Secondary | ICD-10-CM

## 2015-09-24 DIAGNOSIS — K219 Gastro-esophageal reflux disease without esophagitis: Secondary | ICD-10-CM | POA: Diagnosis not present

## 2015-09-24 NOTE — Progress Notes (Signed)
Patient ID: [name], female   DOB: 03-23-1931, 82 y.o.   MRN: [id number]    Location:  [facility] Place of Service: OFFICE  Chief Complaint  Patient presents with  . Medical Management of Chronic Issues    2month follow up    HPI:  82 yo female seen today for f/u. She saw Dr Elyn Aquas in April for bronchitis. She had her colonoscopy last week. No polyps but does have diverticulosis. She does not need f/u colonoscopies due to age.  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 She is followed by Dr Joya Gaskins at [facility]. Last injection given 2-3 weeks ago  Allergic rhinitis on flonase/loratidine is stable  COPD on symbicort, spiriva and daliresp. Uses HFA prn. She has not had PFTs in several yrs  HTN on metoprolol. BP controlled  GERD on prilosec is stable  OAB on myrbetriq and is followed by urology, Dr Vikki Ports. She has stress incontinence especially when she coughs  PAF on metoprolol. Rate is controlled. She sees Dr Gwenlyn Found for cardiology. She had a carotid study that showed stable stenosis. 2D echo showed nml EF in 2016 with stable prosthetic valve. Repeat 2D echo scheduled for Aug 22nd. She has a hx AV replacement. She does not take anticoagulation.   Past Medical History:  Diagnosis Date  . Aortic stenosis 07/20/2012   Aortic stenosis   . Aortic valve disorder 08/13/2011   ECHO - EF 123456; mild diastolic dysfunction; calcified aortic valve, not well visualized; mod/severe aortic stenosis w/ worsening gradients when compared to 2012  . Arthritis   . Cancer ([place])    Breast  . Carotid bruit 08/06/2008   Doppler - R ECA demonstrates noarrowing w/ elevated velocities consistent w/ >70% diameter reduction; R and L ICAs show no evidence of diameter reduction, significant tortuosity or vascular abnormality;   . Change in voice   . COPD (chronic obstructive pulmonary disease) ([place])   . Hearing loss   . Heart murmur   . Hyperlipidemia   . Hypertension    dr Gwenlyn Found  .  Osteoporosis   . PAF (paroxysmal atrial fibrillation) ([place]) 09/04/2012  . PVD (peripheral vascular disease) ([place]) 08/13/2010   R/P MV - normal pattern of perfusion in all regions, EF 76%; no significant wall abnormalities noted; normal perfusion study  . Right carotid bruit    high-grade right external carotid artery stenosis by 2 parts ultrasound 2 years ago  . S/P aortic valve replacement with bioprosthetic valve 08/30/2012   21 mm [facility] Ease bovine pericardial tissue valve    Past Surgical History:  Procedure Laterality Date  . ABDOMINAL HYSTERECTOMY     Partial  . AORTIC VALVE REPLACEMENT N/A 08/30/2012   Procedure: AORTIC VALVE REPLACEMENT (AVR);  Surgeon: Rexene Alberts, MD;  Location: [place];  Service: Open Heart Surgery;  Laterality: N/A;  . BIOPSY SHOULDER Left 10/08/2005   shave biopsy  . BREAST LUMPECTOMY  02/25/1997   right  . CARDIAC CATHETERIZATION    . [place]  . COSMETIC SURGERY Left 1997   Breast implant  . EYE SURGERY  1997   Cataract surgery  . INTRAOPERATIVE TRANSESOPHAGEAL ECHOCARDIOGRAM N/A 08/30/2012   Procedure: INTRAOPERATIVE TRANSESOPHAGEAL ECHOCARDIOGRAM;  Surgeon: Rexene Alberts, MD;  Location: [place];  Service: Open Heart Surgery;  Laterality: N/A;  . LEFT AND RIGHT HEART CATHETERIZATION WITH CORONARY ANGIOGRAM N/A 08/14/2012   Procedure: LEFT AND RIGHT HEART CATHETERIZATION WITH CORONARY ANGIOGRAM;  Surgeon: Lorretta Harp,  MD;  Location: [place] CATH [facility];  Service: Cardiovascular;  Laterality: N/A;  . LEFT HEART CATH  08/14/12   Nl cors, AS  . MASTECTOMY  09/29/95   left  . PARTIAL HYSTERECTOMY    . TOTAL HIP ARTHROPLASTY  09/2010    Patient Care Team: Gildardo Cranker, DO as PCP - General (Internal Medicine) Druscilla Brownie, MD as Consulting Physician (Dermatology) [name] Irani, MD as Consulting Physician (Gastroenterology) Neldon Mc, MD as Consulting Physician (General Surgery) Lorretta Harp, MD as Consulting Physician  (Cardiology) Bjorn Loser, MD as Consulting Physician (Urology)  Social History   Social History  . Marital status: Married    Spouse name: N/A  . Number of children: N/A  . Years of education: N/A   Occupational History  . Not on file.   Social History Main Topics  . Smoking status: Never Smoker  . Smokeless tobacco: Never Used  . Alcohol use No  . Drug use: No  . Sexual activity: Not on file   Other Topics Concern  . Not on file   Social History Narrative  . No narrative on file     reports that she has never smoked. She has never used smokeless tobacco. She reports that she does not drink alcohol or use drugs.  Family History  Problem Relation Age of Onset  . Cancer Mother     Bladder  . Early death Father     Tractor accident  . Early death Brother     Radiation protection practitioner  . Early death Brother     during heart surgery   Family Status  Relation Status  . Mother Deceased at age 72   Cause of Death: Bladder cancer  . Father Deceased at age 63   Cause of Death: Accidental  . Brother Deceased at age 30   Cause of Death: Accidental  . Sister Deceased at age Birth   Cause of Death: Unknown  . Daughter Alive  . Son Alive  . Brother Deceased at age 36   Cause of Death: Complications of heart sugery     Allergies  Allergen Reactions  . Codeine Rash    All over the body.    Medications: Patient's Medications  New Prescriptions   No medications on file  Previous Medications   ACETAMINOPHEN (TYLENOL) 325 MG TABLET    Take 2 tablets (650 mg total) by mouth every 6 (six) hours as needed.   BIOTIN 5000 MCG CAPS    Take 1 capsule by mouth daily.   BUDESONIDE-FORMOTEROL (SYMBICORT) 160-4.5 MCG/ACT INHALER    Inhale 2 puffs into the lungs 2 (two) times daily.   CALCIUM CITRATE-VITAMIN D (CITRACAL + D PO)    Take 1 tablet by mouth 2 (two) times daily.    [facility]S LORATADINE 10 MG TABLET    TAKE 1 TABLET (10 MG TOTAL) BY MOUTH DAILY.   DEXTROMETHORPHAN-GUAIFENESIN  (MUCINEX DM) 30-600 MG 12HR TABLET    One twice daily to help cough and congestion   FLUTICASONE (FLONASE) 50 MCG/ACT NASAL SPRAY    Place 1 spray into both nostrils 2 (two) times daily. 1 spray twice daily   HYDROXYZINE (ATARAX/VISTARIL) 25 MG TABLET    TAKE 1 TABLET BY MOUTH AS NEEDED   MAGNESIUM HYDROXIDE (PHILLIPS MILK OF MAGNESIA PO)    Take 15 mL by mouth. Take daily at 6 o'clock at night   METOPROLOL TARTRATE (LOPRESSOR) 25 MG TABLET    Take 1 tablet (25 mg total) by mouth 2 (  two) times daily.   MIRABEGRON ER (MYRBETRIQ) 50 MG TB24 TABLET    Take 50 mg by mouth daily.   OMEPRAZOLE (PRILOSEC) 20 MG CAPSULE    TAKE 2 CAPSULES BY MOUTH DAILY FOR REFLUX   PROAIR HFA 108 (90 BASE) MCG/ACT INHALER    INHALE 1 PUFF INTO THE LUNGS EVERY 6 (SIX) HOURS AS NEEDED FOR WHEEZING OR SHORTNESS OF BREATH.   ROFLUMILAST (DALIRESP) 500 MCG TABS TABLET    Take 500 mcg by mouth daily.   SHARK LIVER OIL-COCOA BUTTER (PREPARATION H) 0.25-3-85.5 % SUPPOSITORY    Place 1 suppository rectally as needed.   SODIUM CHLORIDE (OCEAN) 0.65 % SOLN NASAL SPRAY    Place 1 spray into both nostrils as needed for congestion.   TIOTROPIUM (SPIRIVA) 18 MCG INHALATION CAPSULE    Place 1 capsule (18 mcg total) into inhaler and inhale daily.   VITAMIN E 400 UNIT CAPSULE    Take 400 Units by mouth daily.  Modified Medications   No medications on file  Discontinued Medications   No medications on file    Review of Systems  Constitutional: Negative for activity change, appetite change, chills, diaphoresis, fatigue and fever.  HENT: Positive for voice change. Negative for ear pain and sore throat.   Eyes: Negative for visual disturbance.  Respiratory: Negative for cough, chest tightness and shortness of breath.   Cardiovascular: Negative for chest pain, palpitations and leg swelling.  Gastrointestinal: Negative for abdominal pain, blood in stool, constipation, diarrhea, nausea and vomiting.  Genitourinary: Positive for frequency.  Negative for dysuria.  Musculoskeletal: Negative for arthralgias.  Neurological: Negative for dizziness, tremors, numbness and headaches.  Psychiatric/Behavioral: Negative for sleep disturbance. The patient is not nervous/anxious.     Vitals:   09/24/15 1335  BP: 124/68  Pulse: 60  Temp: 98.3 F (36.8 C)  TempSrc: Oral  SpO2: 97%  Weight: 168 lb 9.6 oz (76.5 kg)  Height: 5\' 3"  (1.6 m)   Body mass index is 29.87 kg/m.  Physical Exam  Constitutional: She is oriented to person, place, and time. She appears well-developed and well-nourished.  HENT:  Mouth/Throat: Oropharynx is clear and moist. No oropharyngeal exudate.  Strangled voice quality  Eyes: Pupils are equal, round, and reactive to light. No scleral icterus.  Neck: Neck supple. Carotid bruit is present (systolic b/l from chest). No tracheal deviation present.  Cardiovascular: Normal rate, regular rhythm and intact distal pulses.  Exam reveals no gallop and no friction rub.   Murmur (1/6 SEM. opening snap of aortic valve) heard. No LE edema b/l. no calf TTP.   Pulmonary/Chest: Effort normal and breath sounds normal. No stridor. No respiratory distress. She has no wheezes. She has no rales.  Abdominal: Soft. Bowel sounds are normal. She exhibits no distension and no mass. There is no hepatomegaly. There is no tenderness. There is no rebound and no guarding.  Musculoskeletal: She exhibits edema.  Lymphadenopathy:    She has no cervical adenopathy.  Neurological: She is alert and oriented to person, place, and time. She has normal reflexes.  Skin: Skin is warm and dry. No rash noted.  Psychiatric: She has a normal mood and affect. Her behavior is normal. Judgment and thought content normal.     Labs reviewed: No visits with results within 3 Month(s) from this visit.  Latest known visit with results is:  Appointment on 05/30/2015  Component Date Value Ref Range Status  . Glucose 05/31/2015 99  65 - 99 mg/dL Final  .  BUN  05/31/2015 16  8 - 27 mg/dL Final  . Creatinine, Ser 05/31/2015 1.05* 0.57 - 1.00 mg/dL Final  . GFR calc non Af Amer 05/31/2015 50* >59 mL/min/1.73 Final  . GFR calc Af Amer 05/31/2015 57* >59 mL/min/1.73 Final  . BUN/Creatinine Ratio 05/31/2015 15  11 - 26 Final   Comment: **Effective June 02, 2015 BUN/Creatinine Ratio**   reference interval will be changing to:              Age                  Female          Female      0 days   -  7 days          9 - 25         9 - 26      8 days   - 30 days          8 - 16        10 - 33      1 month  -  6 months       11 - 43        11 - 75      7 months -  1 year         20 - 43        20 - 22      2 years  -  5 years        51 - 25        19 - 69      6 years  - 18 years        25 - 74        13 - 28     13 years  - 106 years        20 - 21        10 - 74     18 years  - 2 years         14 - 63         9 - 40                >59 years        10 - 28        12 - 74   . Sodium 05/31/2015 142  134 - 144 mmol/L Final  . Potassium 05/31/2015 5.0  3.5 - 5.2 mmol/L Final  . Chloride 05/31/2015 102  96 - 106 mmol/L Final  . CO2 05/31/2015 24  18 - 29 mmol/L Final  . Calcium 05/31/2015 9.9  8.7 - 10.3 mg/dL Final  . ALT 05/31/2015 13  0 - 32 IU/L Final  . WBC 05/31/2015 5.7  3.4 - 10.8 x10E3/uL Final  . RBC 05/31/2015 3.81  3.77 - 5.28 x10E6/uL Final  . Hemoglobin 05/31/2015 11.9  11.1 - 15.9 g/dL Final  . Hematocrit 05/31/2015 35.9  34.0 - 46.6 % Final  . MCV 05/31/2015 94  79 - 97 fL Final  . MCH 05/31/2015 31.2  26.6 - 33.0 pg Final  . MCHC 05/31/2015 33.1  31.5 - 35.7 g/dL Final  . RDW 05/31/2015 13.5  12.3 - 15.4 % Final  . Platelets 05/31/2015 214  150 - 379 x10E3/uL Final  . Neutrophils 05/31/2015 55  % Final  . Lymphs 05/31/2015 31  % Final  . Monocytes 05/31/2015 9  % Final  .  Eos 05/31/2015 4  % Final  . Basos 05/31/2015 1  % Final  . Neutrophils Absolute 05/31/2015 3.2  1.4 - 7.0 x10E3/uL Final  . Lymphocytes Absolute 05/31/2015 1.8   0.7 - 3.1 x10E3/uL Final  . Monocytes Absolute 05/31/2015 0.5  0.1 - 0.9 x10E3/uL Final  . EOS (ABSOLUTE) 05/31/2015 0.2  0.0 - 0.4 x10E3/uL Final  . Basophils Absolute 05/31/2015 0.0  0.0 - 0.2 x10E3/uL Final  . Immature Granulocytes 05/31/2015 0  % Final  . Immature Grans (Abs) 05/31/2015 0.0  0.0 - 0.1 x10E3/uL Final  . Cholesterol, Total 05/31/2015 152  100 - 199 mg/dL Final  . Triglycerides 05/31/2015 99  0 - 149 mg/dL Final  . HDL 05/31/2015 54  >39 mg/dL Final  . VLDL Cholesterol Cal 05/31/2015 20  5 - 40 mg/dL Final  . LDL Calculated 05/31/2015 78  0 - 99 mg/dL Final  . Chol/HDL Ratio 05/31/2015 2.8  0.0 - 4.4 ratio units Final   Comment:                                   T. Chol/HDL Ratio                                             Men  Women                               1/2 Avg.Risk  3.4    3.3                                   Avg.Risk  5.0    4.4                                2X Avg.Risk  9.6    7.1                                3X Avg.Risk 23.4   11.0     No results found.   Assessment/Plan   ICD-9-CM ICD-10-CM   1. Diverticulosis of colon without hemorrhage 562.10 K57.30   2. Essential hypertension 401.9 I10   3. Laryngeal dystonia 478.79 J38.3   4. PAF (paroxysmal atrial fibrillation) (HCC) 427.31 I48.0   5. Chronic obstructive pulmonary disease, unspecified COPD, unspecified chronic bronchitis type 496 J44.9   6. Gastroesophageal reflux disease without esophagitis 530.81 K21.9   7. S/P aortic valve replacement with bioprosthetic valve V42.2 Z95.4     Educations handout on diverticulosis given  Continue current medications as ordered  Follow up with specialists as scheduled  Follow up in 4 mos for routine visit. Fasting labs prior to appt (cmp, tsh)   [name]  [facility] [address] [place], [place] [address] [phone] Cell (Monday-Friday 8 AM - 5 PM) [phone] After 5 PM and  follow prompts

## 2015-09-24 NOTE — Patient Instructions (Addendum)
Continue current medications as ordered  Follow up with specialists as scheduled  Follow up in 4 mos for routine visit. Fasting labs prior to appt   Diverticulosis Diverticulosis is the condition that develops when small pouches (diverticula) form in the wall of your colon. Your colon, or large intestine, is where water is absorbed and stool is formed. The pouches form when the inside layer of your colon pushes through weak spots in the outer layers of your colon. CAUSES  No one knows exactly what causes diverticulosis. RISK FACTORS  Being older than 7. Your risk for this condition increases with age. Diverticulosis is rare in people younger than 40 years. By age 6, almost everyone has it.  Eating a low-fiber diet.  Being frequently constipated.  Being overweight.  Not getting enough exercise.  Smoking.  Taking over-the-counter pain medicines, like aspirin and ibuprofen. SYMPTOMS  Most people with diverticulosis do not have symptoms. DIAGNOSIS  Because diverticulosis often has no symptoms, health care providers often discover the condition during an exam for other colon problems. In many cases, a health care provider will diagnose diverticulosis while using a flexible scope to examine the colon (colonoscopy). TREATMENT  If you have never developed an infection related to diverticulosis, you may not need treatment. If you have had an infection before, treatment may include:  Eating more fruits, vegetables, and grains.  Taking a fiber supplement.  Taking a live bacteria supplement (probiotic).  Taking medicine to relax your colon. HOME CARE INSTRUCTIONS   Drink at least 6-8 glasses of water each day to prevent constipation.  Try not to strain when you have a bowel movement.  Keep all follow-up appointments. If you have had an infection before:  Increase the fiber in your diet as directed by your health care provider or dietitian.  Take a dietary fiber supplement if  your health care provider approves.  Only take medicines as directed by your health care provider. SEEK MEDICAL CARE IF:   You have abdominal pain.  You have bloating.  You have cramps.  You have not gone to the bathroom in 3 days. SEEK IMMEDIATE MEDICAL CARE IF:   Your pain gets worse.  Yourbloating becomes very bad.  You have a fever or chills, and your symptoms suddenly get worse.  You begin vomiting.  You have bowel movements that are bloody or black. MAKE SURE YOU:  Understand these instructions.  Will watch your condition.  Will get help right away if you are not doing well or get worse.   This information is not intended to replace advice given to you by your health care provider. Make sure you discuss any questions you have with your health care provider.   Document Released: 11/13/2003 Document Revised: 02/20/2013 Document Reviewed: 01/10/2013 Elsevier Interactive Patient Education [employer].

## 2015-09-25 ENCOUNTER — Encounter: Payer: Self-pay | Admitting: Cardiovascular Disease

## 2015-09-25 ENCOUNTER — Encounter: Payer: Self-pay | Admitting: *Deleted

## 2015-10-21 ENCOUNTER — Encounter (INDEPENDENT_AMBULATORY_CARE_PROVIDER_SITE_OTHER): Payer: Self-pay

## 2015-10-21 ENCOUNTER — Ambulatory Visit (HOSPITAL_COMMUNITY): Payer: Medicare Other | Attending: Cardiology

## 2015-10-21 ENCOUNTER — Other Ambulatory Visit: Payer: Self-pay

## 2015-10-21 DIAGNOSIS — I350 Nonrheumatic aortic (valve) stenosis: Secondary | ICD-10-CM | POA: Diagnosis not present

## 2015-10-21 DIAGNOSIS — I119 Hypertensive heart disease without heart failure: Secondary | ICD-10-CM | POA: Insufficient documentation

## 2015-10-21 DIAGNOSIS — Z952 Presence of prosthetic heart valve: Secondary | ICD-10-CM

## 2015-10-21 DIAGNOSIS — I10 Essential (primary) hypertension: Secondary | ICD-10-CM

## 2015-10-21 DIAGNOSIS — Z954 Presence of other heart-valve replacement: Secondary | ICD-10-CM

## 2015-10-21 DIAGNOSIS — Z953 Presence of xenogenic heart valve: Secondary | ICD-10-CM | POA: Insufficient documentation

## 2015-10-21 DIAGNOSIS — I351 Nonrheumatic aortic (valve) insufficiency: Secondary | ICD-10-CM | POA: Diagnosis not present

## 2015-10-21 DIAGNOSIS — I340 Nonrheumatic mitral (valve) insufficiency: Secondary | ICD-10-CM | POA: Diagnosis not present

## 2015-10-21 DIAGNOSIS — I359 Nonrheumatic aortic valve disorder, unspecified: Secondary | ICD-10-CM | POA: Diagnosis present

## 2015-10-22 ENCOUNTER — Telehealth: Payer: Self-pay | Admitting: Cardiovascular Disease

## 2015-10-22 ENCOUNTER — Other Ambulatory Visit: Payer: Self-pay | Admitting: *Deleted

## 2015-10-22 DIAGNOSIS — Z952 Presence of prosthetic heart valve: Secondary | ICD-10-CM

## 2015-10-22 NOTE — Telephone Encounter (Signed)
Daughter on DPR list.  Called and communicated results and noted that patient has also received communication regarding recent echo. No further concerns or questions.

## 2015-10-22 NOTE — Telephone Encounter (Signed)
New message    Patient daughter calling back regarding echo test results.

## 2015-12-04 ENCOUNTER — Other Ambulatory Visit: Payer: Self-pay | Admitting: Nurse Practitioner

## 2015-12-09 ENCOUNTER — Ambulatory Visit (INDEPENDENT_AMBULATORY_CARE_PROVIDER_SITE_OTHER): Payer: Medicare Other

## 2015-12-09 DIAGNOSIS — Z23 Encounter for immunization: Secondary | ICD-10-CM | POA: Diagnosis not present

## 2015-12-23 DIAGNOSIS — R350 Frequency of micturition: Secondary | ICD-10-CM | POA: Diagnosis not present

## 2015-12-23 DIAGNOSIS — N3946 Mixed incontinence: Secondary | ICD-10-CM | POA: Diagnosis not present

## 2016-01-01 ENCOUNTER — Other Ambulatory Visit: Payer: Self-pay | Admitting: Internal Medicine

## 2016-01-16 DIAGNOSIS — J385 Laryngeal spasm: Secondary | ICD-10-CM | POA: Diagnosis not present

## 2016-02-04 ENCOUNTER — Encounter: Payer: Self-pay | Admitting: Internal Medicine

## 2016-02-04 ENCOUNTER — Ambulatory Visit (INDEPENDENT_AMBULATORY_CARE_PROVIDER_SITE_OTHER): Payer: Medicare Other | Admitting: Internal Medicine

## 2016-02-04 ENCOUNTER — Ambulatory Visit (INDEPENDENT_AMBULATORY_CARE_PROVIDER_SITE_OTHER): Payer: Medicare Other

## 2016-02-04 VITALS — BP 150/70 | HR 65 | Temp 98.3°F | Ht 63.0 in | Wt 169.8 lb

## 2016-02-04 VITALS — BP 162/68 | HR 65 | Temp 98.3°F | Ht 63.0 in | Wt 169.8 lb

## 2016-02-04 DIAGNOSIS — K219 Gastro-esophageal reflux disease without esophagitis: Secondary | ICD-10-CM

## 2016-02-04 DIAGNOSIS — I10 Essential (primary) hypertension: Secondary | ICD-10-CM | POA: Diagnosis not present

## 2016-02-04 DIAGNOSIS — J383 Other diseases of vocal cords: Secondary | ICD-10-CM | POA: Diagnosis not present

## 2016-02-04 DIAGNOSIS — Z0000 Encounter for general adult medical examination without abnormal findings: Secondary | ICD-10-CM | POA: Diagnosis not present

## 2016-02-04 DIAGNOSIS — J302 Other seasonal allergic rhinitis: Secondary | ICD-10-CM

## 2016-02-04 DIAGNOSIS — I480 Paroxysmal atrial fibrillation: Secondary | ICD-10-CM

## 2016-02-04 DIAGNOSIS — J441 Chronic obstructive pulmonary disease with (acute) exacerbation: Secondary | ICD-10-CM

## 2016-02-04 DIAGNOSIS — Z953 Presence of xenogenic heart valve: Secondary | ICD-10-CM | POA: Diagnosis not present

## 2016-02-04 LAB — CBC WITH DIFFERENTIAL/PLATELET
Basophils Absolute: 0 cells/uL (ref 0–200)
Basophils Relative: 0 %
Eosinophils Absolute: 285 cells/uL (ref 15–500)
Eosinophils Relative: 3 %
HCT: 36.8 % (ref 35.0–45.0)
Hemoglobin: 12.3 g/dL (ref 11.7–15.5)
Lymphocytes Relative: 17 %
Lymphs Abs: 1615 cells/uL (ref 850–3900)
MCH: 31.1 pg (ref 27.0–33.0)
MCHC: 33.4 g/dL (ref 32.0–36.0)
MCV: 92.9 fL (ref 80.0–100.0)
MPV: 9.8 fL (ref 7.5–12.5)
Monocytes Absolute: 760 cells/uL (ref 200–950)
Monocytes Relative: 8 %
Neutro Abs: 6840 cells/uL (ref 1500–7800)
Neutrophils Relative %: 72 %
Platelets: 225 10*3/uL (ref 140–400)
RBC: 3.96 MIL/uL (ref 3.80–5.10)
RDW: 13.6 % (ref 11.0–15.0)
WBC: 9.5 10*3/uL (ref 3.8–10.8)

## 2016-02-04 LAB — BASIC METABOLIC PANEL WITH GFR
BUN: 20 mg/dL (ref 7–25)
CO2: 25 mmol/L (ref 20–31)
Calcium: 9.6 mg/dL (ref 8.6–10.4)
Chloride: 101 mmol/L (ref 98–110)
Creat: 1.07 mg/dL — ABNORMAL HIGH (ref 0.60–0.88)
GFR, Est African American: 56 mL/min — ABNORMAL LOW (ref >=60)
GFR, Est Non African American: 48 mL/min — ABNORMAL LOW (ref >=60)
Glucose, Bld: 85 mg/dL (ref 65–99)
Potassium: 4.3 mmol/L (ref 3.5–5.3)
Sodium: 137 mmol/L (ref 135–146)

## 2016-02-04 MED ORDER — METHYLPREDNISOLONE ACETATE 40 MG/ML IJ SUSP
40.0000 mg | Freq: Once | INTRAMUSCULAR | Status: AC
  Administered 2016-02-04: 40 mg via INTRAMUSCULAR

## 2016-02-04 MED ORDER — MOXIFLOXACIN HCL 400 MG PO TABS: 400.0000 mg | ORAL_TABLET | Freq: Every day | ORAL | 0 refills | Status: DC

## 2016-02-04 MED ORDER — SACCHAROMYCES BOULARDII 250 MG PO CAPS: 250.0000 mg | ORAL_CAPSULE | Freq: Two times a day (BID) | ORAL | 0 refills | Status: DC

## 2016-02-04 NOTE — Progress Notes (Signed)
Patient ID: [name], female   DOB: 1931/11/04, 82 y.o.   MRN: [id number]    Location:  [facility] Place of Service: OFFICE  Chief Complaint  Patient presents with  . Medical Management of Chronic Issues    4 months routine visit  . Other    MMSE 26/30 failed clock drawing     HPI:  82 yo female seen today for f/u. She c/o 2 week hx cough, sore throat and rhinorrhea. Tried OTC plain claritin without relief. She has nausea, post nasal drip. No sick contacts  She gets botox injections into vocal cord for dystonia. She is followed by Dr Joya Gaskins at [facility]. Last injection given 2-3 weeks ago  Allergic rhinitis on flonase/loratidine is stable  COPD on symbicort, spiriva and daliresp. Uses HFA prn. She has not had PFTs in several yrs. She stopped taking daliresp some time ago  HTN on metoprolol. BP controlled  GERD on prilosec is stable  OAB on myrbetriq and is followed by urology, Dr Vikki Ports. She has stress incontinence especially when she coughs  PAF on metoprolol. Rate is controlled. She sees Dr Gwenlyn Found for cardiology. She had a carotid study that showed stable stenosis. 2D echo showed nml EF in 2016 with stable prosthetic valve. Repeat 2D echo in Aug 2017 showed nml LV size and fxn with biatrial enlargement; bioprosthetic valve functioning well. She has a hx AV replacement. She does not take anticoagulation.  Past Medical History:  Diagnosis Date  . Aortic stenosis 07/20/2012   Aortic stenosis   . Aortic valve disorder 08/13/2011   ECHO - EF >03%; mild diastolic dysfunction; calcified aortic valve, not well visualized; mod/severe aortic stenosis w/ worsening gradients when compared to 2012  . Arthritis   . Cancer ([place])    Breast  . Carotid bruit 08/06/2008   Doppler - R ECA demonstrates noarrowing w/ elevated velocities consistent w/ >70% diameter reduction; R and L ICAs show no evidence of diameter reduction, significant tortuosity or vascular abnormality;   . Change in  voice   . COPD (chronic obstructive pulmonary disease) ([place])   . Hearing loss   . Heart murmur   . Hyperlipidemia   . Hypertension    dr Gwenlyn Found  . Osteoporosis   . PAF (paroxysmal atrial fibrillation) ([place]) 09/04/2012  . PVD (peripheral vascular disease) ([place]) 08/13/2010   R/P MV - normal pattern of perfusion in all regions, EF 76%; no significant wall abnormalities noted; normal perfusion study  . Right carotid bruit    high-grade right external carotid artery stenosis by 2 parts ultrasound 2 years ago  . S/P aortic valve replacement with bioprosthetic valve 08/30/2012   21 mm [facility] Ease bovine pericardial tissue valve    Past Surgical History:  Procedure Laterality Date  . ABDOMINAL HYSTERECTOMY     Partial  . AORTIC VALVE REPLACEMENT N/A 08/30/2012   Procedure: AORTIC VALVE REPLACEMENT (AVR);  Surgeon: Rexene Alberts, MD;  Location: [place];  Service: Open Heart Surgery;  Laterality: N/A;  . BIOPSY SHOULDER Left 10/08/2005   shave biopsy  . BREAST LUMPECTOMY  02/25/1997   right  . CARDIAC CATHETERIZATION    . [place]  . COSMETIC SURGERY Left 1997   Breast implant  . EYE SURGERY  1997   Cataract surgery  . INTRAOPERATIVE TRANSESOPHAGEAL ECHOCARDIOGRAM N/A 08/30/2012   Procedure: INTRAOPERATIVE TRANSESOPHAGEAL ECHOCARDIOGRAM;  Surgeon: Rexene Alberts, MD;  Location: [place];  Service: Open Heart Surgery;  Laterality: N/A;  .  LEFT AND RIGHT HEART CATHETERIZATION WITH CORONARY ANGIOGRAM N/A 08/14/2012   Procedure: LEFT AND RIGHT HEART CATHETERIZATION WITH CORONARY ANGIOGRAM;  Surgeon: Lorretta Harp, MD;  Location: [facility] CATH LAB;  Service: Cardiovascular;  Laterality: N/A;  . LEFT HEART CATH  08/14/12   Nl cors, AS  . MASTECTOMY  09/29/95   left  . PARTIAL HYSTERECTOMY    . TOTAL HIP ARTHROPLASTY  09/2010    Patient Care Team: Gildardo Cranker, DO as PCP - General (Internal Medicine) Druscilla Brownie, MD as Consulting Physician (Dermatology) [name] Irani, MD as Consulting  Physician (Gastroenterology) Neldon Mc, MD as Consulting Physician (General Surgery) Lorretta Harp, MD as Consulting Physician (Cardiology) Bjorn Loser, MD as Consulting Physician (Urology) Lavell Anchors, MD as Consulting Physician (Ophthalmology)  Social History   Social History  . Marital status: Married    Spouse name: N/A  . Number of children: N/A  . Years of education: N/A   Occupational History  . Not on file.   Social History Main Topics  . Smoking status: Never Smoker  . Smokeless tobacco: Never Used  . Alcohol use No  . Drug use: No  . Sexual activity: No   Other Topics Concern  . Not on file   Social History Narrative  . No narrative on file     reports that she has never smoked. She has never used smokeless tobacco. She reports that she does not drink alcohol or use drugs.  Family History  Problem Relation Age of Onset  . Cancer Mother     Bladder  . Early death Father     Tractor accident  . Early death Brother     Radiation protection practitioner  . Early death Brother     during heart surgery   Family Status  Relation Status  . Mother Deceased at age 71   Cause of Death: Bladder cancer  . Father Deceased at age 7   Cause of Death: Accidental  . Brother Deceased at age 52   Cause of Death: Accidental  . Sister Deceased at age Birth   Cause of Death: Unknown  . Daughter Alive  . Son Alive  . Brother Deceased at age 73   Cause of Death: Complications of heart sugery     Allergies  Allergen Reactions  . Codeine Rash    All over the body.    Medications: Patient's Medications  New Prescriptions   No medications on file  Previous Medications   ACETAMINOPHEN (TYLENOL) 325 MG TABLET    Take 2 tablets (650 mg total) by mouth every 6 (six) hours as needed.   BIOTIN 5000 MCG CAPS    Take 1 capsule by mouth daily.   BUDESONIDE-FORMOTEROL (SYMBICORT) 160-4.5 MCG/ACT INHALER    Inhale 2 puffs into the lungs 2 (two) times daily.   CALCIUM  CITRATE-VITAMIN D (CITRACAL + D PO)    Take 1 tablet by mouth 2 (two) times daily.    CVS LORATADINE 10 MG TABLET    TAKE 1 TABLET (10 MG TOTAL) BY MOUTH DAILY.   DEXTROMETHORPHAN-GUAIFENESIN (MUCINEX DM) 30-600 MG 12HR TABLET    One twice daily to help cough and congestion   FLUTICASONE (FLONASE) 50 MCG/ACT NASAL SPRAY    INHALE 1 SPRAY TWICE DAILY NOSTRIL(S)   HYDROXYZINE (ATARAX/VISTARIL) 25 MG TABLET    TAKE 1 TABLET BY MOUTH AS NEEDED   MAGNESIUM HYDROXIDE (PHILLIPS MILK OF MAGNESIA PO)    Take 15 mL by mouth. Take daily at 6  o'clock at night   METOPROLOL TARTRATE (LOPRESSOR) 25 MG TABLET    Take 1 tablet (25 mg total) by mouth 2 (two) times daily.   MIRABEGRON ER (MYRBETRIQ) 50 MG TB24 TABLET    Take 50 mg by mouth daily.   OMEPRAZOLE (PRILOSEC) 20 MG CAPSULE    TAKE 2 CAPSULES BY MOUTH DAILY FOR REFLUX   PROAIR HFA 108 (90 BASE) MCG/ACT INHALER    INHALE 1 PUFF INTO THE LUNGS EVERY 6 (SIX) HOURS AS NEEDED FOR WHEEZING OR SHORTNESS OF BREATH.   SHARK LIVER OIL-COCOA BUTTER (PREPARATION H) 0.25-3-85.5 % SUPPOSITORY    Place 1 suppository rectally as needed.   SODIUM CHLORIDE (OCEAN) 0.65 % SOLN NASAL SPRAY    Place 1 spray into both nostrils as needed for congestion.   TIOTROPIUM (SPIRIVA) 18 MCG INHALATION CAPSULE    Place 1 capsule (18 mcg total) into inhaler and inhale daily.   VITAMIN E 400 UNIT CAPSULE    Take 400 Units by mouth daily.  Modified Medications   No medications on file  Discontinued Medications   No medications on file    Review of Systems  Unable to perform ROS: Other (memory loss)    Vitals:   02/04/16 1423  BP: (!) 162/68 repeat by myself 150/70  Pulse: 65  Temp: 98.3 F (36.8 C)  TempSrc: Oral  SpO2: 99%  Weight: 169 lb 12.8 oz (77 kg)  Height: 5' 3"  (1.6 m)   Body mass index is 30.08 kg/m.  Physical Exam  Constitutional: She is oriented to person, place, and time. She appears well-developed and well-nourished.  HENT:  Mouth/Throat: Oropharynx is  clear and moist. No oropharyngeal exudate.  Right TM with increased cerumen but no impaction; left TM appears dull; no sinus TTP; oropharynx cobblestoning and redness  Eyes: Pupils are equal, round, and reactive to light. No scleral icterus.  Neck: Neck supple. Carotid bruit is present (systolic b/l from chest). No tracheal deviation present.  Cardiovascular: Normal rate, regular rhythm and intact distal pulses.  Exam reveals no gallop and no friction rub.   Murmur (1/6 SEM. opening snap of aortic valve) heard. Trace LE edema b/l. no calf TTP.   Pulmonary/Chest: Effort normal. No stridor. No respiratory distress. She has wheezes (b/l end expiratory with prolonged expiratory phase). She has no rales.  Abdominal: Soft. Bowel sounds are normal. She exhibits no distension and no mass. There is no hepatomegaly. There is no tenderness. There is no rebound and no guarding.  Musculoskeletal: She exhibits edema and tenderness.  Lymphadenopathy:    She has no cervical adenopathy.  Neurological: She is alert and oriented to person, place, and time. She has normal reflexes.  Skin: Skin is warm and dry. No rash noted.  Psychiatric: She has a normal mood and affect. Her behavior is normal. Judgment and thought content normal.     Labs reviewed: No visits with results within 3 Month(s) from this visit.  Latest known visit with results is:  Abstract on 09/25/2015  Component Date Value Ref Range Status  . HM Colonoscopy 09/18/2015 See Report  See Report, Patient Reported Normal Final    No results found.   Assessment/Plan   ICD-9-CM ICD-10-CM   1. COPD exacerbation (HCC) 491.21 J44.1 moxifloxacin (AVELOX) 400 MG tablet     saccharomyces boulardii (FLORASTOR) 250 MG capsule     CBC with Differential/Platelets     methylPREDNISolone acetate (DEPO-MEDROL) injection 40 mg  2. PAF (paroxysmal atrial fibrillation) (HCC) 427.31 I48.0  3. S/P aortic valve replacement with bioprosthetic valve V42.2 Z95.3    4. Other seasonal allergic rhinitis 477.8 J30.2 methylPREDNISolone acetate (DEPO-MEDROL) injection 40 mg  5. Gastroesophageal reflux disease without esophagitis 530.81 K21.9   6. Essential hypertension 401.9 I10 BMP with eGFR  7. Laryngeal dystonia 478.79 J38.3     Push fluids and rest  Continue current medications as ordered  Will call with lab results  Follow up with specialists as scheduled  Depo medrol injection 45m given today  Follow up in 3 mos for routine visit    Layci Stenglein S. CPerlie Gold PH[facility] [address] [place] [phone] (Monday-Friday 8 AM - 5 PM) [phone] 5 PM and follow prompts

## 2016-02-04 NOTE — Progress Notes (Signed)
Quick Notes   Health Maintenance:  Pt is UTD on health maintenance    Abnormal Screen:  None; MMSE-26/30; Failed Clock Test   Patient Concerns:  Pt has had a head cold/congestion and ear pain x 2 wks. Would like to have her ears checked and discuss if she can take Muccinex Dm. She has only been taking her Claritin for the cold.     Nurse Concerns:  BP elevated today (162/68).

## 2016-02-04 NOTE — Progress Notes (Signed)
Subjective:   [name] is a 82 y.o. female who presents for Medicare Annual (Subsequent) preventive examination.  Review of Systems:  Cardiac Risk Factors include: advanced age (>73men, >72 women);hypertension;family history of premature cardiovascular disease     Objective:     Vitals: BP (!) 162/68 (BP Location: Right Arm, Patient Position: Sitting, Cuff Size: Normal)   Pulse 65   Temp 98.3 F (36.8 C) (Oral)   Ht 5\' 3"  (1.6 m)   Wt 169 lb 12.8 oz (77 kg)   SpO2 99%   BMI 30.08 kg/m   Body mass index is 30.08 kg/m.   Tobacco History  Smoking Status  . Never Smoker  Smokeless Tobacco  . Never Used     Counseling given: No   Past Medical History:  Diagnosis Date  . Aortic stenosis 07/20/2012   Aortic stenosis   . Aortic valve disorder 08/13/2011   ECHO - EF 123456; mild diastolic dysfunction; calcified aortic valve, not well visualized; mod/severe aortic stenosis w/ worsening gradients when compared to 2012  . Arthritis   . Cancer ([place])    Breast  . Carotid bruit 08/06/2008   Doppler - R ECA demonstrates noarrowing w/ elevated velocities consistent w/ >70% diameter reduction; R and L ICAs show no evidence of diameter reduction, significant tortuosity or vascular abnormality;   . Change in voice   . COPD (chronic obstructive pulmonary disease) ([place])   . Hearing loss   . Heart murmur   . Hyperlipidemia   . Hypertension    dr Gwenlyn Found  . Osteoporosis   . PAF (paroxysmal atrial fibrillation) ([place]) 09/04/2012  . PVD (peripheral vascular disease) ([place]) 08/13/2010   R/P MV - normal pattern of perfusion in all regions, EF 76%; no significant wall abnormalities noted; normal perfusion study  . Right carotid bruit    high-grade right external carotid artery stenosis by 2 parts ultrasound 2 years ago  . S/P aortic valve replacement with bioprosthetic valve 08/30/2012   21 mm [facility] Ease bovine pericardial tissue valve   Past Surgical History:  Procedure Laterality  Date  . ABDOMINAL HYSTERECTOMY     Partial  . AORTIC VALVE REPLACEMENT N/A 08/30/2012   Procedure: AORTIC VALVE REPLACEMENT (AVR);  Surgeon: Rexene Alberts, MD;  Location: [place];  Service: Open Heart Surgery;  Laterality: N/A;  . BIOPSY SHOULDER Left 10/08/2005   shave biopsy  . BREAST LUMPECTOMY  02/25/1997   right  . CARDIAC CATHETERIZATION    . [place]  . COSMETIC SURGERY Left 1997   Breast implant  . EYE SURGERY  1997   Cataract surgery  . INTRAOPERATIVE TRANSESOPHAGEAL ECHOCARDIOGRAM N/A 08/30/2012   Procedure: INTRAOPERATIVE TRANSESOPHAGEAL ECHOCARDIOGRAM;  Surgeon: Rexene Alberts, MD;  Location: [place];  Service: Open Heart Surgery;  Laterality: N/A;  . LEFT AND RIGHT HEART CATHETERIZATION WITH CORONARY ANGIOGRAM N/A 08/14/2012   Procedure: LEFT AND RIGHT HEART CATHETERIZATION WITH CORONARY ANGIOGRAM;  Surgeon: Lorretta Harp, MD;  Location: [facility] CATH LAB;  Service: Cardiovascular;  Laterality: N/A;  . LEFT HEART CATH  08/14/12   Nl cors, AS  . MASTECTOMY  09/29/95   left  . PARTIAL HYSTERECTOMY    . TOTAL HIP ARTHROPLASTY  09/2010   Family History  Problem Relation Age of Onset  . Cancer Mother     Bladder  . Early death Father     Tractor accident  . Early death Brother     Radiation protection practitioner  .  Early death Brother     during heart surgery   History  Sexual Activity  . Sexual activity: No    Outpatient Encounter Prescriptions as of 02/04/2016  Medication Sig  . acetaminophen (TYLENOL) 325 MG tablet Take 2 tablets (650 mg total) by mouth every 6 (six) hours as needed.  . Biotin 5000 MCG CAPS Take 1 capsule by mouth daily.  . budesonide-formoterol (SYMBICORT) 160-4.5 MCG/ACT inhaler Inhale 2 puffs into the lungs 2 (two) times daily.  . Calcium Citrate-Vitamin D (CITRACAL + D PO) Take 1 tablet by mouth 2 (two) times daily.   . CVS LORATADINE 10 MG tablet TAKE 1 TABLET (10 MG TOTAL) BY MOUTH DAILY.  Marland Kitchen dextromethorphan-guaiFENesin (MUCINEX DM) 30-600 MG 12hr tablet One  twice daily to help cough and congestion  . fluticasone (FLONASE) 50 MCG/ACT nasal spray INHALE 1 SPRAY TWICE DAILY NOSTRIL(S)  . hydrOXYzine (ATARAX/VISTARIL) 25 MG tablet TAKE 1 TABLET BY MOUTH AS NEEDED  . Magnesium Hydroxide (PHILLIPS MILK OF MAGNESIA PO) Take 15 mL by mouth. Take daily at 6 o'clock at night  . metoprolol tartrate (LOPRESSOR) 25 MG tablet Take 1 tablet (25 mg total) by mouth 2 (two) times daily.  . mirabegron ER (MYRBETRIQ) 50 MG TB24 tablet Take 50 mg by mouth daily.  Marland Kitchen omeprazole (PRILOSEC) 20 MG capsule TAKE 2 CAPSULES BY MOUTH DAILY FOR REFLUX  . PROAIR HFA 108 (90 Base) MCG/ACT inhaler INHALE 1 PUFF INTO THE LUNGS EVERY 6 (SIX) HOURS AS NEEDED FOR WHEEZING OR SHORTNESS OF BREATH.  . shark liver oil-cocoa butter (PREPARATION H) 0.25-3-85.5 % suppository Place 1 suppository rectally as needed.  . sodium chloride (OCEAN) 0.65 % SOLN nasal spray Place 1 spray into both nostrils as needed for congestion.  Marland Kitchen tiotropium (SPIRIVA) 18 MCG inhalation capsule Place 1 capsule (18 mcg total) into inhaler and inhale daily.  . vitamin E 400 UNIT capsule Take 400 Units by mouth daily.  . [DISCONTINUED] roflumilast (DALIRESP) 500 MCG TABS tablet Take 500 mcg by mouth daily.   No facility-administered encounter medications on file as of 02/04/2016.     Activities of Daily Living In your present state of health, do you have any difficulty performing the following activities: 02/04/2016  Hearing? Y  Vision? N  Difficulty concentrating or making decisions? N  Walking or climbing stairs? N  Dressing or bathing? N  Doing errands, shopping? N  Preparing Food and eating ? N  Using the Toilet? N  In the past six months, have you accidently leaked urine? Y  Do you have problems with loss of bowel control? N  Managing your Medications? N  Managing your Finances? N  Housekeeping or managing your Housekeeping? N  Some recent data might be hidden    Patient Care Team: Gildardo Cranker, DO  as PCP - General (Internal Medicine) Druscilla Brownie, MD as Consulting Physician (Dermatology) [name] Irani, MD as Consulting Physician (Gastroenterology) Neldon Mc, MD as Consulting Physician (General Surgery) Lorretta Harp, MD as Consulting Physician (Cardiology) Bjorn Loser, MD as Consulting Physician (Urology) Lavell Anchors, MD as Consulting Physician (Ophthalmology)    Assessment:    Exercise Activities and Dietary recommendations Current Exercise Habits: Home exercise routine, Type of exercise: strength training/weights;stretching;walking, Time (Minutes): 15, Frequency (Times/Week): 7, Weekly Exercise (Minutes/Week): 105, Intensity: Mild, Exercise limited by: None identified  Goals    . Increase water intake          Starting 02/04/16, I will attempt to increase my water from 3 bottles to  4-5 bottles per day.       Fall Risk Fall Risk  02/04/2016 09/24/2015 05/14/2015 11/27/2014 08/21/2014  Falls in the past year? No No No No Yes  Number falls in past yr: - - - - 1  Injury with Fall? - - - - No   Depression Screen PHQ 2/9 Scores 02/04/2016 09/24/2015 01/01/2014 10/18/2012  PHQ - 2 Score 0 0 0 0     Cognitive Function MMSE - Mini Mental State Exam 02/04/2016 11/27/2014  Orientation to time 5 5  Orientation to Place 5 5  Registration 3 3  Attention/ Calculation 4 4  Recall 2 2  Language- name 2 objects 2 2  Language- repeat 1 1  Language- follow 3 step command 3 3  Language- read & follow direction 0 1  Write a sentence 0 1  Copy design 1 1  Total score 26 28        Immunization History  Administered Date(s) Administered  . Influenza,inj,Quad PF,36+ Mos 11/29/2012, 11/27/2013, 11/27/2014, 12/09/2015  . Pneumococcal Conjugate-13 05/21/2014  . Pneumococcal Polysaccharide-23 01/11/2005, 08/15/2012  . Tdap 05/28/2014  . Zoster 01/14/2009   Screening Tests Health Maintenance  Topic Date Due  . TETANUS/TDAP  05/27/2024  . INFLUENZA VACCINE  Completed  .  DEXA SCAN  Completed  . ZOSTAVAX  Completed  . PNA vac Low Risk Adult  Completed      Plan:    I have personally reviewed and addressed the Medicare Annual Wellness questionnaire and have noted the following in the patient's chart:  A. Medical and social history B. Use of alcohol, tobacco or illicit drugs  C. Current medications and supplements D. Functional ability and status E.  Nutritional status F.  Physical activity G. Advance directives H. List of other physicians I.  Hospitalizations, surgeries, and ER visits in previous 12 months J.  [place] to include hearing, vision, cognitive, depression L. Referrals and appointments - none  In addition, I have reviewed and discussed with patient certain preventive protocols, quality metrics, and best practice recommendations. A written personalized care plan for preventive services as well as general preventive health recommendations were provided to patient.  See attached scanned questionnaire for additional information.   Signed,   Allyn Kenner, LPN Health Advisor   I have reviewed the health advisor's note and was available for consultation. I agree with documentation and plan.   Harmony Sandell S. Perlie Gold  [facility] [address] [place], [place] [address] [phone] Cell (Monday-Friday 8 AM - 5 PM) [phone] After 5 PM and follow prompts

## 2016-02-04 NOTE — Patient Instructions (Signed)
Push fluids and rest  Continue current medications as ordered  Will call with lab results  Follow up with specialists as scheduled  Depo medrol injection 40mg  given today  Follow up in 3 mos for routine visit

## 2016-02-04 NOTE — Patient Instructions (Addendum)
[name] , Thank you for taking time to come for your Medicare Wellness Visit. I appreciate your ongoing commitment to your health goals. Please review the following plan we discussed and let me know if I can assist you in the future.   These are the goals we discussed: Goals    . Increase water intake          Starting 02/04/16, I will attempt to increase my water from 3 bottles to 4-5 bottles per day.        This is a list of the screening recommended for you and due dates:  Health Maintenance  Topic Date Due  . Tetanus Vaccine  05/27/2024  . Flu Shot  Completed  . DEXA scan (bone density measurement)  Completed  . Shingles Vaccine  Completed  . Pneumonia vaccines  Completed  Preventive Care for Adults  A healthy lifestyle and preventive care can promote health and wellness. Preventive health guidelines for adults include the following key practices.  . A routine yearly physical is a good way to check with your health care provider about your health and preventive screening. It is a chance to share any concerns and updates on your health and to receive a thorough exam.  . Visit your dentist for a routine exam and preventive care every 6 months. Brush your teeth twice a day and floss once a day. Good oral hygiene prevents tooth decay and gum disease.  . The frequency of eye exams is based on your age, health, family medical history, use  of contact lenses, and other factors. Follow your health care provider's ecommendations for frequency of eye exams.  . Eat a healthy diet. Foods like vegetables, fruits, whole grains, low-fat dairy products, and lean protein foods contain the nutrients you need without too many calories. Decrease your intake of foods high in solid fats, added sugars, and salt. Eat the right amount of calories for you. Get information about a proper diet from your health care provider, if necessary.  . Regular physical exercise is one of the most important things  you can do for your health. Most adults should get at least 150 minutes of moderate-intensity exercise (any activity that increases your heart rate and causes you to sweat) each week. In addition, most adults need muscle-strengthening exercises on 2 or more days a week.  Silver Sneakers may be a benefit available to you. To determine eligibility, you may visit the website: www.silversneakers.com or contact program at 251-099-7308 Mon-Fri between 8AM-8PM.   . Maintain a healthy weight. The body mass index (BMI) is a screening tool to identify possible weight problems. It provides an estimate of body fat based on height and weight. Your health care provider can find your BMI and can help you achieve or maintain a healthy weight.   For adults 20 years and older: ? A BMI below 18.5 is considered underweight. ? A BMI of 18.5 to 24.9 is normal. ? A BMI of 25 to 29.9 is considered overweight. ? A BMI of 30 and above is considered obese.   . Maintain normal blood lipids and cholesterol levels by exercising and minimizing your intake of saturated fat. Eat a balanced diet with plenty of fruit and vegetables. Blood tests for lipids and cholesterol should begin at age 20 and be repeated every 5 years. If your lipid or cholesterol levels are high, you are over 50, or you are at high risk for heart disease, you may need your cholesterol levels  checked more frequently. Ongoing high lipid and cholesterol levels should be treated with medicines if diet and exercise are not working.  . If you smoke, find out from your health care provider how to quit. If you do not use tobacco, please do not start.  . If you choose to drink alcohol, please do not consume more than 2 drinks per day. One drink is considered to be 12 ounces (355 mL) of beer, 5 ounces (148 mL) of wine, or 1.5 ounces (44 mL) of liquor.  . If you are 40-22 years old, ask your health care provider if you should take aspirin to prevent strokes.  . Use  sunscreen. Apply sunscreen liberally and repeatedly throughout the day. You should seek shade when your shadow is shorter than you. Protect yourself by wearing long sleeves, pants, a wide-brimmed hat, and sunglasses year round, whenever you are outdoors.  . Once a month, do a whole body skin exam, using a mirror to look at the skin on your back. Tell your health care provider of new moles, moles that have irregular borders, moles that are larger than a pencil eraser, or moles that have changed in shape or color.

## 2016-02-16 ENCOUNTER — Other Ambulatory Visit (HOSPITAL_BASED_OUTPATIENT_CLINIC_OR_DEPARTMENT_OTHER): Payer: Self-pay | Admitting: Internal Medicine

## 2016-03-11 DIAGNOSIS — S92112A Displaced fracture of neck of left talus, initial encounter for closed fracture: Secondary | ICD-10-CM | POA: Diagnosis not present

## 2016-03-11 DIAGNOSIS — S99912A Unspecified injury of left ankle, initial encounter: Secondary | ICD-10-CM | POA: Insufficient documentation

## 2016-03-11 DIAGNOSIS — S93402A Sprain of unspecified ligament of left ankle, initial encounter: Secondary | ICD-10-CM | POA: Diagnosis not present

## 2016-03-11 DIAGNOSIS — M7989 Other specified soft tissue disorders: Secondary | ICD-10-CM | POA: Diagnosis not present

## 2016-03-30 DIAGNOSIS — S92152D Displaced avulsion fracture (chip fracture) of left talus, subsequent encounter for fracture with routine healing: Secondary | ICD-10-CM | POA: Diagnosis not present

## 2016-03-30 DIAGNOSIS — S99912D Unspecified injury of left ankle, subsequent encounter: Secondary | ICD-10-CM | POA: Diagnosis not present

## 2016-04-20 DIAGNOSIS — S92112D Displaced fracture of neck of left talus, subsequent encounter for fracture with routine healing: Secondary | ICD-10-CM | POA: Diagnosis not present

## 2016-04-20 DIAGNOSIS — S99912D Unspecified injury of left ankle, subsequent encounter: Secondary | ICD-10-CM | POA: Diagnosis not present

## 2016-05-05 ENCOUNTER — Encounter: Payer: Self-pay | Admitting: Internal Medicine

## 2016-05-05 ENCOUNTER — Ambulatory Visit (INDEPENDENT_AMBULATORY_CARE_PROVIDER_SITE_OTHER): Payer: Medicare Other | Admitting: Internal Medicine

## 2016-05-05 VITALS — BP 118/70 | HR 72 | Temp 97.8°F | Ht 63.0 in | Wt 180.8 lb

## 2016-05-05 DIAGNOSIS — L84 Corns and callosities: Secondary | ICD-10-CM | POA: Diagnosis not present

## 2016-05-05 DIAGNOSIS — J42 Unspecified chronic bronchitis: Secondary | ICD-10-CM

## 2016-05-05 DIAGNOSIS — I10 Essential (primary) hypertension: Secondary | ICD-10-CM

## 2016-05-05 DIAGNOSIS — Z953 Presence of xenogenic heart valve: Secondary | ICD-10-CM

## 2016-05-05 DIAGNOSIS — I480 Paroxysmal atrial fibrillation: Secondary | ICD-10-CM

## 2016-05-05 DIAGNOSIS — J383 Other diseases of vocal cords: Secondary | ICD-10-CM | POA: Diagnosis not present

## 2016-05-05 DIAGNOSIS — N183 Chronic kidney disease, stage 3 unspecified: Secondary | ICD-10-CM

## 2016-05-05 DIAGNOSIS — I6529 Occlusion and stenosis of unspecified carotid artery: Secondary | ICD-10-CM

## 2016-05-05 NOTE — Progress Notes (Signed)
Patient ID: [name], female   DOB: 1931-11-29, 83 y.o.   MRN: [id number]    Location:  [facility] Place of Service: OFFICE  Chief Complaint  Patient presents with  . Medical Management of Chronic Issues    3 months routine visit    HPI:  83 yo female seen today for f/u. She fell in Jan while descending stairs at church. She sprain her left foot during the fall and f/u with Ortho Dr Jeoffrey Massed office ([place]). xrays revealed ? small avulsion fx. she was dx with ankle sprain. She wore an ASO ankle brace x 3 weeks--> a boot x 3 weeks. No PT req'd.   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 She is followed by Dr Joya Gaskins at [facility]. Next injection due Mar 16th.  Allergic rhinitis on flonase/loratidine is stable  COPD - stable on symbicort, spiriva and daliresp. Uses HFA prn. She has not had PFTs in several yrs. She stopped taking daliresp some time ago  HTN on metoprolol. BP controlled  GERD on prilosec is stable  OAB on myrbetriq and is followed by urology, Dr Vikki Ports. She has stress incontinence especially when she coughs  PAF on metoprolol. Rate is controlled. She sees Dr Gwenlyn Found for cardiology. She had a carotid study that showed stable stenosis. 2D echo showed nml EF in 2016 with stable prosthetic valve. Repeat 2D echo in Aug 2017 showed nml LV size and fxn with biatrial enlargement; bioprosthetic valve functioning well. She has a hx AV replacement. She does not take anticoagulation.  Past Medical History:  Diagnosis Date  . Aortic stenosis 07/20/2012   Aortic stenosis   . Aortic valve disorder 08/13/2011   ECHO - EF >03%; mild diastolic dysfunction; calcified aortic valve, not well visualized; mod/severe aortic stenosis w/ worsening gradients when compared to 2012  . Arthritis   . Cancer ([place])    Breast  . Carotid bruit 08/06/2008   Doppler - R ECA demonstrates noarrowing w/ elevated velocities consistent w/ >70% diameter reduction; R and L  ICAs show no evidence of diameter reduction, significant tortuosity or vascular abnormality;   . Change in voice   . COPD (chronic obstructive pulmonary disease) ([place])   . Hearing loss   . Heart murmur   . Hyperlipidemia   . Hypertension    dr Gwenlyn Found  . Osteoporosis   . PAF (paroxysmal atrial fibrillation) ([place]) 09/04/2012  . PVD (peripheral vascular disease) ([place]) 08/13/2010   R/P MV - normal pattern of perfusion in all regions, EF 76%; no significant wall abnormalities noted; normal perfusion study  . Right carotid bruit    high-grade right external carotid artery stenosis by 2 parts ultrasound 2 years ago  . S/P aortic valve replacement with bioprosthetic valve 08/30/2012   21 mm [facility] Ease bovine pericardial tissue valve    Past Surgical History:  Procedure Laterality Date  . ABDOMINAL HYSTERECTOMY     Partial  . AORTIC VALVE REPLACEMENT N/A 08/30/2012   Procedure: AORTIC VALVE REPLACEMENT (AVR);  Surgeon: Rexene Alberts, MD;  Location: [place];  Service: Open Heart Surgery;  Laterality: N/A;  . BIOPSY SHOULDER Left 10/08/2005   shave biopsy  . BREAST LUMPECTOMY  02/25/1997   right  . CARDIAC CATHETERIZATION    . [place]  . COSMETIC SURGERY Left 1997   Breast implant  . EYE SURGERY  1997   Cataract surgery  . INTRAOPERATIVE TRANSESOPHAGEAL ECHOCARDIOGRAM N/A 08/30/2012   Procedure:  INTRAOPERATIVE TRANSESOPHAGEAL ECHOCARDIOGRAM;  Surgeon: Rexene Alberts, MD;  Location: [place];  Service: Open Heart Surgery;  Laterality: N/A;  . LEFT AND RIGHT HEART CATHETERIZATION WITH CORONARY ANGIOGRAM N/A 08/14/2012   Procedure: LEFT AND RIGHT HEART CATHETERIZATION WITH CORONARY ANGIOGRAM;  Surgeon: Lorretta Harp, MD;  Location: [facility] CATH LAB;  Service: Cardiovascular;  Laterality: N/A;  . LEFT HEART CATH  08/14/12   Nl cors, AS  . MASTECTOMY  09/29/95   left  . PARTIAL HYSTERECTOMY    . TOTAL HIP ARTHROPLASTY  09/2010    Patient Care Team: Gildardo Cranker, DO as PCP - General  (Internal Medicine) Druscilla Brownie, MD as Consulting Physician (Dermatology) [name] Irani, MD as Consulting Physician (Gastroenterology) Neldon Mc, MD as Consulting Physician (General Surgery) Lorretta Harp, MD as Consulting Physician (Cardiology) Bjorn Loser, MD as Consulting Physician (Urology) Lavell Anchors, MD as Consulting Physician (Ophthalmology)  Social History   Social History  . Marital status: Married    Spouse name: N/A  . Number of children: N/A  . Years of education: N/A   Occupational History  . Not on file.   Social History Main Topics  . Smoking status: Never Smoker  . Smokeless tobacco: Never Used  . Alcohol use No  . Drug use: No  . Sexual activity: No   Other Topics Concern  . Not on file   Social History Narrative  . No narrative on file     reports that she has never smoked. She has never used smokeless tobacco. She reports that she does not drink alcohol or use drugs.  Family History  Problem Relation Age of Onset  . Cancer Mother     Bladder  . Early death Father     Tractor accident  . Early death Brother     Radiation protection practitioner  . Early death Brother     during heart surgery   Family Status  Relation Status  . Mother Deceased at age 76   Cause of Death: Bladder cancer  . Father Deceased at age 22   Cause of Death: Accidental  . Brother Deceased at age 17   Cause of Death: Accidental  . Sister Deceased at age Birth   Cause of Death: Unknown  . Daughter Alive  . Son Alive  . Brother Deceased at age 43   Cause of Death: Complications of heart sugery     Allergies  Allergen Reactions  . Codeine Rash    All over the body.    Medications: Patient's Medications  New Prescriptions   No medications on file  Previous Medications   ACETAMINOPHEN (TYLENOL) 325 MG TABLET    Take 2 tablets (650 mg total) by mouth every 6 (six) hours as needed.   BIOTIN 5000 MCG CAPS    Take 1 capsule by mouth daily.   BUDESONIDE-FORMOTEROL  (SYMBICORT) 160-4.5 MCG/ACT INHALER    Inhale 2 puffs into the lungs 2 (two) times daily.   CALCIUM CITRATE-VITAMIN D (CITRACAL + D PO)    Take 1 tablet by mouth 2 (two) times daily.    [facility] LORATADINE 10 MG TABLET    TAKE 1 TABLET (10 MG TOTAL) BY MOUTH DAILY.   DEXTROMETHORPHAN-GUAIFENESIN (MUCINEX DM) 30-600 MG 12HR TABLET    One twice daily to help cough and congestion   FLUTICASONE (FLONASE) 50 MCG/ACT NASAL SPRAY    INHALE 1 SPRAY TWICE DAILY NOSTRIL(S)   HYDROXYZINE (ATARAX/VISTARIL) 25 MG TABLET    TAKE 1 TABLET BY MOUTH  AS NEEDED   MAGNESIUM HYDROXIDE (PHILLIPS MILK OF MAGNESIA PO)    Take 15 mL by mouth. Take daily at 6 o'clock at night   METOPROLOL TARTRATE (LOPRESSOR) 25 MG TABLET    Take 1 tablet (25 mg total) by mouth 2 (two) times daily.   MIRABEGRON ER (MYRBETRIQ) 50 MG TB24 TABLET    Take 50 mg by mouth daily.   MOXIFLOXACIN (AVELOX) 400 MG TABLET    Take 1 tablet (400 mg total) by mouth daily.   OMEPRAZOLE (PRILOSEC) 20 MG CAPSULE    TAKE 2 CAPSULES BY MOUTH DAILY FOR REFLUX   PROAIR HFA 108 (90 BASE) MCG/ACT INHALER    INHALE 1 PUFF INTO THE LUNGS EVERY 6 (SIX) HOURS AS NEEDED FOR WHEEZING OR SHORTNESS OF BREATH.   SACCHAROMYCES BOULARDII (FLORASTOR) 250 MG CAPSULE    Take 1 capsule (250 mg total) by mouth 2 (two) times daily.   SHARK LIVER OIL-COCOA BUTTER (PREPARATION H) 0.25-3-85.5 % SUPPOSITORY    Place 1 suppository rectally as needed.   SODIUM CHLORIDE (OCEAN) 0.65 % SOLN NASAL SPRAY    Place 1 spray into both nostrils as needed for congestion.   TIOTROPIUM (SPIRIVA) 18 MCG INHALATION CAPSULE    Place 1 capsule (18 mcg total) into inhaler and inhale daily.   VITAMIN E 400 UNIT CAPSULE    Take 400 Units by mouth daily.  Modified Medications   No medications on file  Discontinued Medications   No medications on file    Review of Systems  Unable to perform ROS: Other (memory loss)    Vitals:   05/05/16 1425  BP: 118/70  Pulse: 72  Temp: 97.8 F (36.6 C)  TempSrc:  Oral  SpO2: 93%  Weight: 180 lb 12.8 oz (82 kg)  Height: 5' 3"  (1.6 m)   Body mass index is 32.03 kg/m.  Physical Exam  Constitutional: She is oriented to person, place, and time. She appears well-developed and well-nourished.  HENT:  Mouth/Throat: Oropharynx is clear and moist. No oropharyngeal exudate.  Right TM with increased cerumen but no impaction; left TM appears dull; no sinus TTP; oropharynx cobblestoning and redness  Eyes: Pupils are equal, round, and reactive to light. No scleral icterus.  Neck: Neck supple. Carotid bruit is present (systolic b/l from chest). No tracheal deviation present.  Cardiovascular: Normal rate, regular rhythm and intact distal pulses.  Exam reveals no gallop and no friction rub.   Murmur (1/6 SEM. opening snap of aortic valve) heard. Trace LE edema b/l. no calf TTP.   Pulmonary/Chest: Effort normal. No stridor. No respiratory distress. She has wheezes (scattered end expiratory). She has no rales.  Abdominal: Soft. Bowel sounds are normal. She exhibits no distension and no mass. There is no hepatomegaly. There is no tenderness. There is no rebound and no guarding.  Musculoskeletal: She exhibits edema and tenderness.  Lymphadenopathy:    She has no cervical adenopathy.  Neurological: She is alert and oriented to person, place, and time. She has normal reflexes.  Skin: Skin is warm and dry. No rash noted.  Right 3rd toe anterior TTP callus vs bony prominence  Psychiatric: She has a normal mood and affect. Her behavior is normal. Judgment and thought content normal.     Labs reviewed: No visits with results within 3 Month(s) from this visit.  Latest known visit with results is:  Office Visit on 02/04/2016  Component Date Value Ref Range Status  . Sodium 02/04/2016 137  135 - 146 mmol/L  Final  . Potassium 02/04/2016 4.3  3.5 - 5.3 mmol/L Final  . Chloride 02/04/2016 101  98 - 110 mmol/L Final  . CO2 02/04/2016 25  20 - 31 mmol/L Final  . Glucose,  Bld 02/04/2016 85  65 - 99 mg/dL Final  . BUN 02/04/2016 20  7 - 25 mg/dL Final  . Creat 02/04/2016 1.07* 0.60 - 0.88 mg/dL Final   Comment:   For patients > or = 81 years of age: The upper reference limit for Creatinine is approximately 13% higher for people identified as African-American.     . Calcium 02/04/2016 9.6  8.6 - 10.4 mg/dL Final  . GFR, Est African American 02/04/2016 56* >=60 mL/min Final  . GFR, Est Non African American 02/04/2016 48* >=60 mL/min Final  . WBC 02/04/2016 9.5  3.8 - 10.8 K/uL Final  . RBC 02/04/2016 3.96  3.80 - 5.10 MIL/uL Final  . Hemoglobin 02/04/2016 12.3  11.7 - 15.5 g/dL Final  . HCT 02/04/2016 36.8  35.0 - 45.0 % Final  . MCV 02/04/2016 92.9  80.0 - 100.0 fL Final  . MCH 02/04/2016 31.1  27.0 - 33.0 pg Final  . MCHC 02/04/2016 33.4  32.0 - 36.0 g/dL Final  . RDW 02/04/2016 13.6  11.0 - 15.0 % Final  . Platelets 02/04/2016 225  140 - 400 K/uL Final  . MPV 02/04/2016 9.8  7.5 - 12.5 fL Final  . Neutro Abs 02/04/2016 6840  1,500 - 7,800 cells/uL Final  . Lymphs Abs 02/04/2016 1615  850 - 3,900 cells/uL Final  . Monocytes Absolute 02/04/2016 760  200 - 950 cells/uL Final  . Eosinophils Absolute 02/04/2016 285  15 - 500 cells/uL Final  . Basophils Absolute 02/04/2016 0  0 - 200 cells/uL Final  . Neutrophils Relative % 02/04/2016 72  % Final  . Lymphocytes Relative 02/04/2016 17  % Final  . Monocytes Relative 02/04/2016 8  % Final  . Eosinophils Relative 02/04/2016 3  % Final  . Basophils Relative 02/04/2016 0  % Final  . Smear Review 02/04/2016 Criteria for review not met   Final    No results found.   Assessment/Plan   ICD-9-CM ICD-10-CM   1. Callus of foot 700 L84 Ambulatory referral to Podiatry   right 3rd toe  2. PAF (paroxysmal atrial fibrillation) ([facility]) 427.31 I48.0   3. S/P aortic valve replacement with bioprosthetic valve V42.2 Z95.3   4. CKD (chronic kidney disease) stage 3, GFR 30-59 ml/min 585.3 N18.3 CMP with eGFR  5. Essential  hypertension 401.9 I10   6. 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 Chronic bronchitis, unspecified chronic bronchitis type ([place]) 491.9 J42   8. Stenosis of carotid artery, unspecified laterality 433.10 I65.29 Lipid Panel   Will call with referral to podiatry  Continue current medications as ordered  Fasting labs in 1-2 weeks  Follow up in 4 mos for routine visit   Paighton Godette S. Perlie Gold  [facility] [address] [place],  [address] [phone] Cell (Monday-Friday 8 AM - 5 PM) [phone] After 5 PM and follow prompts

## 2016-05-05 NOTE — Patient Instructions (Addendum)
Will call with referral to podiatry  Continue current medications as ordered  Fasting labs in 1-2 weeks  Follow up in 4 mos for routine visit

## 2016-05-14 DIAGNOSIS — J385 Laryngeal spasm: Secondary | ICD-10-CM | POA: Diagnosis not present

## 2016-05-21 ENCOUNTER — Other Ambulatory Visit: Payer: Medicare Other

## 2016-05-21 DIAGNOSIS — N183 Chronic kidney disease, stage 3 unspecified: Secondary | ICD-10-CM

## 2016-05-21 DIAGNOSIS — I6529 Occlusion and stenosis of unspecified carotid artery: Secondary | ICD-10-CM | POA: Diagnosis not present

## 2016-05-21 LAB — COMPLETE METABOLIC PANEL WITH GFR
ALT: 11 U/L (ref 6–29)
AST: 17 U/L (ref 10–35)
Albumin: 3.9 g/dL (ref 3.6–5.1)
Alkaline Phosphatase: 60 U/L (ref 33–130)
BUN: 13 mg/dL (ref 7–25)
CO2: 30 mmol/L (ref 20–31)
Calcium: 9.4 mg/dL (ref 8.6–10.4)
Chloride: 106 mmol/L (ref 98–110)
Creat: 1.1 mg/dL — ABNORMAL HIGH (ref 0.60–0.88)
GFR, Est African American: 54 mL/min — ABNORMAL LOW (ref >=60)
GFR, Est Non African American: 47 mL/min — ABNORMAL LOW (ref >=60)
Glucose, Bld: 89 mg/dL (ref 65–99)
Potassium: 4.3 mmol/L (ref 3.5–5.3)
Sodium: 141 mmol/L (ref 135–146)
Total Bilirubin: 0.5 mg/dL (ref 0.2–1.2)
Total Protein: 6 g/dL — ABNORMAL LOW (ref 6.1–8.1)

## 2016-05-21 LAB — LIPID PANEL
Cholesterol: 155 mg/dL (ref <200)
HDL: 54 mg/dL (ref >50)
LDL Cholesterol: 76 mg/dL (ref <100)
Total CHOL/HDL Ratio: 2.9 Ratio (ref <5.0)
Triglycerides: 124 mg/dL (ref <150)
VLDL: 25 mg/dL (ref <30)

## 2016-06-03 ENCOUNTER — Ambulatory Visit (INDEPENDENT_AMBULATORY_CARE_PROVIDER_SITE_OTHER): Payer: Medicare Other | Admitting: Podiatry

## 2016-06-03 ENCOUNTER — Encounter: Payer: Self-pay | Admitting: Podiatry

## 2016-06-03 VITALS — Resp 16 | Ht 63.0 in | Wt 160.0 lb

## 2016-06-03 DIAGNOSIS — L84 Corns and callosities: Secondary | ICD-10-CM

## 2016-06-03 DIAGNOSIS — M2042 Other hammer toe(s) (acquired), left foot: Secondary | ICD-10-CM | POA: Diagnosis not present

## 2016-06-03 DIAGNOSIS — I6529 Occlusion and stenosis of unspecified carotid artery: Secondary | ICD-10-CM | POA: Diagnosis not present

## 2016-06-03 DIAGNOSIS — M2041 Other hammer toe(s) (acquired), right foot: Secondary | ICD-10-CM | POA: Diagnosis not present

## 2016-06-03 NOTE — Patient Instructions (Signed)
Soak Instructions    Place 1/4 cup of epsom salts in a quart of warm tap water.  Submerge your foot or feet with outer bandage intact for the initial soak; this will allow the bandage to become moist and wet for easy lift off.  Once you remove your bandage, continue to soak in the solution for 20 minutes.  This soak should be done twice a day.  Next, remove your foot or feet from solution, blot dry the affected area and cover.  You may use a band aid large enough to cover the area or use gauze and tape.  Apply other medications to the area as directed by the doctor such as polysporin neosporin.  IF YOUR SKIN BECOMES IRRITATED WHILE USING THESE INSTRUCTIONS, IT IS OKAY TO SWITCH TO  WHITE VINEGAR AND WATER. Or you may use antibacterial soap and water to keep the toe clean  Monitor for any signs/symptoms of infection. Call the office immediately if any occur or go directly to the emergency room. Call with any questions/concerns.  

## 2016-06-04 NOTE — Progress Notes (Signed)
Subjective:     Patient ID: [name], female   DOB: Aug 01, 1931, 83 y.o.   MRN: [id number]  HPI 83 year old female presents the also concerns of painful corns to the right third toe is been ongoing for a long time she states. She's had no recent treatment for this. She denies any recent injury or trauma. She has no numbness or tingling. She has no complaints today.  Review of Systems  All other systems reviewed and are negative.      Objective:   Physical Exam General: AAO x3, NAD  Dermatological: Hyperkeratotic lesion present dorsal right third DIPJ. No underlying ulceration, drainage or any signs of infection. No other open lesions or pre-ulcerative lesions identified today.  Vascular: Dorsalis Pedis artery and Posterior Tibial artery pedal pulses are 2/4 bilateral with immedate capillary fill time.  There is no pain with calf compression, swelling, warmth, erythema.   Neruologic: Grossly intact via light touch bilateral. Vibratory intact via tuning fork bilateral. Protective threshold with Semmes Wienstein monofilament intact to all pedal sites bilateral.   Musculoskeletal: Hammertoes are present and there is contracture at the right third DIPJ. Muscular strength 5/5 in all groups tested bilateral.  Gait: Unassisted, Nonantalgic.      Assessment:     Hyperkeratotic lesion due to underlying digital deformity    Plan:     -Treatment options discussed including all alternatives, risks, and complications -Etiology of symptoms were discussed -Hyperkeratotic lesion sharply debrided 1 without complications or bleeding. Offloading pads were dispensed. Discussed shoe gear modifications as well. Follow up of symptoms recur or worsen. Call any questions or concerns.  Celesta Gentile, DPM

## 2016-06-23 DIAGNOSIS — N3946 Mixed incontinence: Secondary | ICD-10-CM | POA: Diagnosis not present

## 2016-07-13 DIAGNOSIS — Z961 Presence of intraocular lens: Secondary | ICD-10-CM | POA: Diagnosis not present

## 2016-08-04 ENCOUNTER — Encounter: Payer: Self-pay | Admitting: Cardiovascular Disease

## 2016-08-04 ENCOUNTER — Ambulatory Visit (INDEPENDENT_AMBULATORY_CARE_PROVIDER_SITE_OTHER): Payer: Medicare Other | Admitting: Cardiovascular Disease

## 2016-08-04 VITALS — BP 180/80 | HR 64 | Ht 63.0 in | Wt 172.2 lb

## 2016-08-04 DIAGNOSIS — I10 Essential (primary) hypertension: Secondary | ICD-10-CM | POA: Diagnosis not present

## 2016-08-04 DIAGNOSIS — I480 Paroxysmal atrial fibrillation: Secondary | ICD-10-CM | POA: Diagnosis not present

## 2016-08-04 DIAGNOSIS — Z952 Presence of prosthetic heart valve: Secondary | ICD-10-CM

## 2016-08-04 DIAGNOSIS — I6529 Occlusion and stenosis of unspecified carotid artery: Secondary | ICD-10-CM | POA: Diagnosis not present

## 2016-08-04 MED ORDER — LISINOPRIL-HYDROCHLOROTHIAZIDE 10-12.5 MG PO TABS: 1.0000 | ORAL_TABLET | Freq: Every day | ORAL | 6 refills | Status: DC

## 2016-08-04 NOTE — Assessment & Plan Note (Signed)
History of paroxysmal atrial ablation in the past maintaining normal sinus rhythm.

## 2016-08-04 NOTE — Patient Instructions (Signed)
Medication Instructions: Your physician recommends that you continue on your current medications as directed. Please refer to the Current Medication list given to you today.  START Lisinopril-HCTZ 10-12.5 mg daily.  Labwork: Your physician recommends that you return for lab work in: 2 weeks--BMET  Testing/Procedures: Your physician has requested that you have an echocardiogram in August. Echocardiography is a painless test that uses sound waves to create images of your heart. It provides your doctor with information about the size and shape of your heart and how well your heart's chambers and valves are working. This procedure takes approximately one hour. There are no restrictions for this procedure.  Follow-Up: Your physician wants you to follow-up in: 1 year with Dr. Gwenlyn Found. You will receive a reminder letter in the mail two months in advance. If you don't receive a letter, please call our office to schedule the follow-up appointment.  Your physician recommends that you schedule a follow-up appointment in: 1 month with [facility]. Your physician has requested that you regularly monitor and record your blood pressure readings at home. Please use the same machine at the same time of day to check your readings and record them to bring to your follow-up visit.  If you need a refill on your cardiac medications before your next appointment, please call your pharmacy.

## 2016-08-04 NOTE — Progress Notes (Signed)
08/04/2016 [name]   12/01/1931  [id number]  Primary Physician Bonnie Cranker, DO Primary Cardiologist: Bonnie Harp MD [name], [place]  HPI:  The patient is a 83 year old mildly overweight married Caucasian female who I last saw 07/29/15.. She is a mother of 2, grandmother to 1 grandchild.   Her risk factors include hypertension and family history. A brother died at age 40 from heart-related issues while he was being operated on. She has never had a heart attack or stroke. She does have moderate aortic stenosis by 2D echocardiogram last performed a year ago with a valve area of 0.83 cm2, peak gradient of 57, and mean of 35. She had negative Myoview on August 13, 2010. Since I saw her last, she developed exertional jaw pain, which was fairly reproducible. Her last lipid profile a year ago was excellent with total cholesterol 166, LDL 89, HDL 44. 2-D echo was performed showed critical aortic stenosis the valve area 0.5 cm. Based on thisthe patient underwent right left heart cardiac catheterization by myself revealing normal coronaries and normal left function. She had critical aortic stenosis and ultimately underwent bioprosthetic aortic valve replacement by Dr. Roxy Carpenter on 08/30/12 with an [facility] magna ease pericardial tissue valve (21 mm) excellent result. Her postop course was complicated by nausea and paroxysmal atrial fibrillation for which she was treated with low dose amiodarone and Coumadin anticoagulation. She currently has had no further episodes of PAF. Her Coumadin and amiodarone discontinued. Her followup 2-D echocardiogram performed in August of last year revealed normal LV function with a well-functioning aortic bioprosthesis. She denies chest pain but continues to have dyspnea on exertion.    Current Outpatient Prescriptions  Medication Sig Dispense Refill  . acetaminophen (TYLENOL) 325 MG tablet Take 2 tablets (650 mg total) by mouth every 6 (six) hours as needed.      . Biotin 5000 MCG CAPS Take 1 capsule by mouth daily.    . budesonide-formoterol (SYMBICORT) 160-4.5 MCG/ACT inhaler Inhale 2 puffs into the lungs 2 (two) times daily. 1 Inhaler 3  . Calcium Citrate-Vitamin D (CITRACAL + D PO) Take 1 tablet by mouth 2 (two) times daily.     . [facility] LORATADINE 10 MG tablet TAKE 1 TABLET (10 MG TOTAL) BY MOUTH DAILY. 30 tablet 5  . fluticasone (FLONASE) 50 MCG/ACT nasal spray INHALE 1 SPRAY TWICE DAILY NOSTRIL(S) 16 g 1  . hydrOXYzine (ATARAX/VISTARIL) 25 MG tablet TAKE 1 TABLET BY MOUTH AS NEEDED 30 tablet 5  . Magnesium Hydroxide (PHILLIPS MILK OF MAGNESIA PO) Take 15 mL by mouth. Take daily at 6 o'clock at night    . metoprolol tartrate (LOPRESSOR) 25 MG tablet Take 1 tablet (25 mg total) by mouth 2 (two) times daily. 60 tablet 11  . mirabegron ER (MYRBETRIQ) 50 MG TB24 tablet Take 50 mg by mouth daily.    Marland Kitchen omeprazole (PRILOSEC) 20 MG capsule TAKE 2 CAPSULES BY MOUTH DAILY FOR REFLUX 60 capsule 5  . PROAIR HFA 108 (90 Base) MCG/ACT inhaler INHALE 1 PUFF INTO THE LUNGS EVERY 6 (SIX) HOURS AS NEEDED FOR WHEEZING OR SHORTNESS OF BREATH. 8.5 Inhaler 2  . shark liver oil-cocoa butter (PREPARATION H) 0.25-3-85.5 % suppository Place 1 suppository rectally as needed.    . sodium chloride (OCEAN) 0.65 % SOLN nasal spray Place 1 spray into both nostrils as needed for congestion. 1 Bottle 1  . tiotropium (SPIRIVA) 18 MCG inhalation capsule Place 1 capsule (18 mcg total) into inhaler and  inhale daily. 30 capsule 12  . vitamin E 400 UNIT capsule Take 400 Units by mouth daily.    Marland Kitchen lisinopril-hydrochlorothiazide (PRINZIDE,ZESTORETIC) 10-12.5 MG tablet Take 1 tablet by mouth daily. 30 tablet 6   No current facility-administered medications for this visit.     Allergies  Allergen Reactions  . Codeine Rash    All over the body.    Social History   Social History  . Marital status: Married    Spouse name: N/A  . Number of children: N/A  . Years of education: N/A    Occupational History  . Not on file.   Social History Main Topics  . Smoking status: Never Smoker  . Smokeless tobacco: Never Used  . Alcohol use No  . Drug use: No  . Sexual activity: No   Other Topics Concern  . Not on file   Social History Narrative  . No narrative on file     Review of Systems: General: negative for chills, fever, night sweats or weight changes.  Cardiovascular: negative for chest pain, dyspnea on exertion, edema, orthopnea, palpitations, paroxysmal nocturnal dyspnea or shortness of breath Dermatological: negative for rash Respiratory: negative for cough or wheezing Urologic: negative for hematuria Abdominal: negative for nausea, vomiting, diarrhea, bright red blood per rectum, melena, or hematemesis Neurologic: negative for visual changes, syncope, or dizziness All other systems reviewed and are otherwise negative except as noted above.    Blood pressure (!) 180/80, pulse 64, height 5\' 3"  (1.6 m), weight 172 lb 3.2 oz (78.1 kg).  General appearance: alert and no distress Neck: no adenopathy, no JVD, supple, symmetrical, trachea midline, thyroid not enlarged, symmetric, no tenderness/mass/nodules and High-pitched left carotid bruit Lungs: clear to auscultation bilaterally Heart: Soft outflow tract murmur consistent with aortic stenosis Extremities: extremities normal, atraumatic, no cyanosis or edema  EKG sinus rhythm at 64 without ST or T-wave changes. There were septal Q waves.I  Personally reviewed this EKG.  ASSESSMENT AND PLAN:   Aortic stenosis- critical AS at cath 08/14/12, with surgery History of critical aortic stenosis status post post bioprosthetic aortic valve replacement by Dr. Roxy Carpenter 08/30/12 with an [name] ease pericardial tissue valve (21 mm) with excellent result. Her postop course was complicated by PAF for which she was placed on amiodarone and Coumadin. This result ultimately discontinued. Her last 2-D echo performed 10/19/15  revealed normal LV systolic function with a well functioning aortic bioprosthesis.  HTN (hypertension) History of hypertension blood pressures measured at 180/80. She is only on metoprolol. She has fairly normal renal function. I am going to begin her on lisinopril hydrochlorothiazide 10/12.5. We'll check a basic metabolic panel in 2 weeks and I will see  Erasmo Downer back in one month for follow-up of labs and blood pressure.  PAF (paroxysmal atrial fibrillation) History of paroxysmal atrial ablation in the past maintaining normal sinus rhythm.      Bonnie Harp MD FACP,FACC,FAHA, [facility] 08/04/2016 2:37 PM

## 2016-08-04 NOTE — Assessment & Plan Note (Signed)
History of hypertension blood pressures measured at 180/80. She is only on metoprolol. She has fairly normal renal function. I am going to begin her on lisinopril hydrochlorothiazide 10/12.5. We'll check a basic metabolic panel in 2 weeks and I will see  [name] back in one month for follow-up of labs and blood pressure.

## 2016-08-04 NOTE — Assessment & Plan Note (Signed)
History of critical aortic stenosis status post post bioprosthetic aortic valve replacement by Dr. Roxy Manns 08/30/12 with an [facility] ease pericardial tissue valve (21 mm) with excellent result. Her postop course was complicated by PAF for which she was placed on amiodarone and Coumadin. This result ultimately discontinued. Her last 2-D echo performed 10/19/15 revealed normal LV systolic function with a well functioning aortic bioprosthesis.

## 2016-08-05 ENCOUNTER — Telehealth: Payer: Self-pay | Admitting: Cardiovascular Disease

## 2016-08-05 NOTE — Telephone Encounter (Signed)
Returned the phone call to the patient. She stated that it was recommended that she get a blood pressure monitor for home readings at her last appointment. She was told by [facility] that she could possibly receive the blood pressure monitor for free if Dr.Berry could approve it for Medicare.  [facility]ed [facility] to verify what they would need. The pharmacist stated that the patient would have to go to a medical supply store for the monitor. She would need a script with a diagnosis code. She stated that there would still be a fee at [facility] but could possibly be free at a medical supply store.   Patient called with the new information and informed that we would be back in touch. Will route to the provider for further recommendation on receiving a script with a diagnosis code for a home blood pressure monitor.

## 2016-08-05 NOTE — Telephone Encounter (Signed)
New message      Pt was seen by Dr Gwenlyn Found.  He told pt to get a bp machine.  Pt said that Dr Gwenlyn Found need to contact medicare so that they will pay for the bp machaine.  Pt want to get a [facility] [place]--[phone].  Please call and let her know if this is possible

## 2016-08-09 ENCOUNTER — Encounter: Payer: Self-pay | Admitting: *Deleted

## 2016-08-09 NOTE — Telephone Encounter (Signed)
Spoke with pt, aware letter is ready. Placed in the mail to the patient at her request.

## 2016-08-09 NOTE — Telephone Encounter (Signed)
F/U call:  Patient calling back in regards to blood pressure machine, would like to know what she should do with it. Thanks.

## 2016-08-16 ENCOUNTER — Telehealth: Payer: Self-pay | Admitting: Cardiovascular Disease

## 2016-08-16 NOTE — Telephone Encounter (Signed)
Patient called back. She needed clarification on where to go with the letter of necessity for a blood pressure cuff. She was advised to check with [facility] and call us back if we could be of more help.

## 2016-08-16 NOTE — Telephone Encounter (Signed)
New message    Pt is calling asking for a call back from Hilda Blades about a letter she received. Please call.

## 2016-09-06 NOTE — Progress Notes (Signed)
Patient ID: [name]                 DOB: 1931/06/16                      MRN: [id number]     HPI: [name] is a 83 y.o. female patient of Dr. Gwenlyn Found, with PMH below, who presents today for hypertension evaluation.  At her most recent visit with Dr. Gwenlyn Found her pressure was elevated to 180/80 and she was started on lisinopril/HCTZ. She was scheduled for BMET in 2 weeks, but this was not done - will have drawn today.   She reports today that she has developed a cough since starting new medication. She is not sure if this is from new medication or from using omeprazole long term. She also reports that the cough has contributed to urinary symptoms. She has noticed an increase in this symptoms with the new medication, but with the cough she has frequently wet herself secondary to straining while coughing. She states this is rather embarrassing and keeping her from participating in choir at church.   She denies HA, SOB, dizziness.   PMH: HTN, aortic stenosis, PAF, carotid stenosis, COPD, GERD  Current HTN meds:  Metoprolol tartrate 25mg  BID Lisinopril/HCTZ 10/12.5mg  daily in the morning  BP goal: <140/90 given age   Family History: A brother died at age 81 from heart-related issues while he was being operated on.  Home BP readings: 112-181 (mostly 120s-130s)/60s-70s HR most in 50s-60s.   Wt Readings from Last 3 Encounters:  08/04/16 172 lb 3.2 oz (78.1 kg)  06/03/16 160 lb (72.6 kg)  05/05/16 180 lb 12.8 oz (82 kg)   BP Readings from Last 3 Encounters:  09/07/16 (!) 142/74  08/04/16 (!) 180/80  05/05/16 118/70   Pulse Readings from Last 3 Encounters:  09/07/16 64  08/04/16 64  05/05/16 72    Renal function: CrCl cannot be calculated (Patient's most recent lab result is older than the maximum 21 days allowed.).  Past Medical History:  Diagnosis Date  . Aortic stenosis 07/20/2012   Aortic stenosis   . Aortic valve disorder 08/13/2011   ECHO - EF >41%; mild diastolic  dysfunction; calcified aortic valve, not well visualized; mod/severe aortic stenosis w/ worsening gradients when compared to 2012  . Arthritis   . Cancer ([place])    Breast  . Carotid bruit 08/06/2008   Doppler - R ECA demonstrates noarrowing w/ elevated velocities consistent w/ >70% diameter reduction; R and L ICAs show no evidence of diameter reduction, significant tortuosity or vascular abnormality;   . Change in voice   . COPD (chronic obstructive pulmonary disease) ([place])   . Hearing loss   . Heart murmur   . Hyperlipidemia   . Hypertension    dr Gwenlyn Found  . Osteoporosis   . PAF (paroxysmal atrial fibrillation) ([place]) 09/04/2012  . PVD (peripheral vascular disease) ([place]) 08/13/2010   R/P MV - normal pattern of perfusion in all regions, EF 76%; no significant wall abnormalities noted; normal perfusion study  . Right carotid bruit    high-grade right external carotid artery stenosis by 2 parts ultrasound 2 years ago  . S/P aortic valve replacement with bioprosthetic valve 08/30/2012   [facility] bovine pericard[facility] tissue [facility]    Current Outpatient Prescriptions on File Prior to Visit  Medication Sig Dispense Refill  . acetaminophen (TYLENOL) 325 MG tablet Take 2 tablets (650 mg total) by  mouth every 6 (six) hours as needed.    . Biotin 5000 MCG CAPS Take 1 capsule by mouth daily.    . Calcium Citrate-Vitamin D (CITRACAL + D PO) Take 1 tablet by mouth 2 (two) times daily.     . [facility] LORATADINE 10 MG tablet TAKE 1 TABLET (10 MG TOTAL) BY MOUTH DAILY. 30 tablet 5  . fluticasone (FLONASE) 50 MCG/ACT nasal spray INHALE 1 SPRAY TWICE DAILY NOSTRIL(S) 16 g 1  . hydrOXYzine (ATARAX/VISTARIL) 25 MG tablet TAKE 1 TABLET BY MOUTH AS NEEDED 30 tablet 5  . lisinopril-hydrochlorothiazide (PRINZIDE,ZESTORETIC) 10-12.5 MG tablet Take 1 tablet by mouth daily. 30 tablet 6  . Magnesium Hydroxide (PHILLIPS MILK OF MAGNESIA PO) Take 15 mL by mouth. Take daily at 6 o'clock at night    . metoprolol  tartrate (LOPRESSOR) 25 MG tablet Take 1 tablet (25 mg total) by mouth 2 (two) times daily. 60 tablet 11  . mirabegron ER (MYRBETRIQ) 50 MG TB24 tablet Take 50 mg by mouth daily.    Marland Kitchen omeprazole (PRILOSEC) 20 MG capsule TAKE 2 CAPSULES BY MOUTH DAILY FOR REFLUX 60 capsule 5  . PROAIR HFA 108 (90 Base) MCG/ACT inhaler INHALE 1 PUFF INTO THE LUNGS EVERY 6 (SIX) HOURS AS NEEDED FOR WHEEZING OR SHORTNESS OF BREATH. 8.5 Inhaler 2  . shark liver oil-cocoa butter (PREPARATION H) 0.25-3-85.5 % suppository Place 1 suppository rectally as needed.    . sodium chloride (OCEAN) 0.65 % SOLN nasal spray Place 1 spray into both nostrils as needed for congestion. 1 Bottle 1  . vitamin E 400 UNIT capsule Take 400 Units by mouth daily.    . budesonide-formoterol (SYMBICORT) 160-4.5 MCG/ACT inhaler Inhale 2 puffs into the lungs 2 (two) times daily. (Patient not taking: Reported on 09/07/2016) 1 Inhaler 3  . tiotropium (SPIRIVA) 18 MCG inhalation capsule Place 1 capsule (18 mcg total) into inhaler and inhale daily. (Patient not taking: Reported on 09/07/2016) 30 capsule 12   No current facility-administered medications on file prior to visit.     Allergies  Allergen Reactions  . Codeine Rash    All over the body.    Blood pressure (!) 142/74, pulse 64.   Assessment/Plan: Hypertension: BMET today after start lisinopril/HCTZ. BP is well controlled at home and only slightly above goal in office today, but due to side effects will plan to start valsartan 40mg  daily (which is less associated with cough than lisinopril). Stop lisinopril/HCTZ. Will not restart a diuretic at this time due to urinary symptoms. Continue to monitor pressures at home. Follow up in HTN clinic in 4-6 weeks.  Thank you, Lelan Pons. Patterson Hammersmith, [facility]  09/07/2016 2:31 PM

## 2016-09-07 ENCOUNTER — Ambulatory Visit (INDEPENDENT_AMBULATORY_CARE_PROVIDER_SITE_OTHER): Payer: Medicare Other | Admitting: Pharmacist

## 2016-09-07 ENCOUNTER — Encounter: Payer: Self-pay | Admitting: Pharmacist

## 2016-09-07 VITALS — BP 142/74 | HR 64

## 2016-09-07 DIAGNOSIS — I6529 Occlusion and stenosis of unspecified carotid artery: Secondary | ICD-10-CM

## 2016-09-07 DIAGNOSIS — I10 Essential (primary) hypertension: Secondary | ICD-10-CM | POA: Diagnosis not present

## 2016-09-07 DIAGNOSIS — I480 Paroxysmal atrial fibrillation: Secondary | ICD-10-CM | POA: Diagnosis not present

## 2016-09-07 MED ORDER — VALSARTAN 40 MG PO TABS: 40.0000 mg | ORAL_TABLET | Freq: Every day | ORAL | 1 refills | Status: DC

## 2016-09-07 NOTE — Patient Instructions (Signed)
Return for a follow up appointment in 4 weeks  Check your blood pressure at home daily (if able) and keep record of the readings.  Take your BP meds as follows: STOP lisinopril/HCTZ Start valsartan 40mg  daily   Bring all of your meds, your BP cuff and your record of home blood pressures to your next appointment.  Exercise as you're able, try to walk approximately 30 minutes per day.  Keep salt intake to a minimum, especially watch canned and prepared boxed foods.  Eat more fresh fruits and vegetables and fewer canned items.  Avoid eating in fast food restaurants.    HOW TO TAKE YOUR BLOOD PRESSURE: . Rest 5 minutes before taking your blood pressure. .  Don't smoke or drink caffeinated beverages for at least 30 minutes before. . Take your blood pressure before (not after) you eat. . Sit comfortably with your back supported and both feet on the floor (don't cross your legs). . Elevate your arm to heart level on a table or a desk. . Use the proper sized cuff. It should fit smoothly and snugly around your bare upper arm. There should be enough room to slip a fingertip under the cuff. The bottom edge of the cuff should be 1 inch above the crease of the elbow. . Ideally, take 3 measurements at one sitting and record the average.

## 2016-09-08 ENCOUNTER — Ambulatory Visit (INDEPENDENT_AMBULATORY_CARE_PROVIDER_SITE_OTHER): Payer: Medicare Other | Admitting: Internal Medicine

## 2016-09-08 ENCOUNTER — Encounter: Payer: Self-pay | Admitting: Internal Medicine

## 2016-09-08 VITALS — BP 118/68 | HR 64 | Temp 98.1°F | Ht 63.0 in | Wt 169.0 lb

## 2016-09-08 DIAGNOSIS — I480 Paroxysmal atrial fibrillation: Secondary | ICD-10-CM

## 2016-09-08 DIAGNOSIS — L821 Other seborrheic keratosis: Secondary | ICD-10-CM

## 2016-09-08 DIAGNOSIS — R229 Localized swelling, mass and lump, unspecified: Secondary | ICD-10-CM | POA: Diagnosis not present

## 2016-09-08 DIAGNOSIS — N183 Chronic kidney disease, stage 3 unspecified: Secondary | ICD-10-CM

## 2016-09-08 DIAGNOSIS — I10 Essential (primary) hypertension: Secondary | ICD-10-CM

## 2016-09-08 DIAGNOSIS — J383 Other diseases of vocal cords: Secondary | ICD-10-CM | POA: Diagnosis not present

## 2016-09-08 DIAGNOSIS — M25472 Effusion, left ankle: Secondary | ICD-10-CM

## 2016-09-08 DIAGNOSIS — I6529 Occlusion and stenosis of unspecified carotid artery: Secondary | ICD-10-CM | POA: Diagnosis not present

## 2016-09-08 DIAGNOSIS — J42 Unspecified chronic bronchitis: Secondary | ICD-10-CM | POA: Diagnosis not present

## 2016-09-08 LAB — BASIC METABOLIC PANEL
BUN/Creatinine Ratio: 12 (ref 12–28)
BUN: 15 mg/dL (ref 8–27)
CO2: 24 mmol/L (ref 20–29)
Calcium: 10.2 mg/dL (ref 8.7–10.3)
Chloride: 95 mmol/L — ABNORMAL LOW (ref 96–106)
Creatinine, Ser: 1.22 mg/dL — ABNORMAL HIGH (ref 0.57–1.00)
GFR calc Af Amer: 47 mL/min/{1.73_m2} — ABNORMAL LOW (ref >59)
GFR calc non Af Amer: 41 mL/min/{1.73_m2} — ABNORMAL LOW (ref >59)
Glucose: 94 mg/dL (ref 65–99)
Potassium: 5.4 mmol/L — ABNORMAL HIGH (ref 3.5–5.2)
Sodium: 136 mmol/L (ref 134–144)

## 2016-09-08 NOTE — Patient Instructions (Addendum)
Call with dermatology referral  Follow up with Cardiology as scheduled. Recommend repeat renal function test at next cardio visit  Continue current medications as ordered  Follow up with specialists as scheduled  Follow up in 4 mos for COPD, HTN, vocal cord dystonia.

## 2016-09-08 NOTE — Progress Notes (Signed)
Patient ID: [name], female   DOB: 11/25/1931, 83 y.o.   MRN: [id number]    Location:  [facility] Place of Service: OFFICE  Chief Complaint  Patient presents with  . Medical Management of Chronic Issues    4 months Routine Visit    HPI:  83 yo female seen today for f/u. She saw cardio last month and was started on lisinopril hct. She saw pharmacist for f/u yesterday and ACEI changed to valsartan 2/2 cough. She is a poor historian due to memory loss. 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 She is followed by Dr Joya Gaskins at [facility]. Next injection due July 13th.  Allergic rhinitis - stable on flonase/loratidine  COPD - stable on symbicort and spiriva. Uses HFA prn. She has not had PFTs in several yrs. She stopped taking daliresp some time ago  HTN - borderline controlled. Followed by cardio Dr Gwenlyn Found. Current meds consist of valsartan and metoprolol.   GERD - stable on prilosec 40 mg daily  OAB - stable on myrbetriq and is followed by urology, Dr Vikki Ports. She has stress incontinence especially when she coughs  PAF - rate controlled on metoprolol. Followed by cardio Dr Gwenlyn Found. She had a carotid study that showed stable stenosis. 2D echo showed nml EF in 2016 with stable prosthetic valve. Repeat 2D echo in Aug 2017 showed nml LV size and fxn with biatrial enlargement; bioprosthetic valve functioning well. She has a hx AV replacement. She does not take anticoagulation. LDL 76   Past Medical History:  Diagnosis Date  . Aortic stenosis 07/20/2012   Aortic stenosis   . Aortic valve disorder 08/13/2011   ECHO - EF >40%; mild diastolic dysfunction; calcified aortic valve, not well visualized; mod/severe aortic stenosis w/ worsening gradients when compared to 2012  . Arthritis   . Cancer ([place])    Breast  . Carotid bruit 08/06/2008   Doppler - R ECA demonstrates noarrowing w/ elevated velocities consistent w/ >70% diameter reduction; R  and L ICAs show no evidence of diameter reduction, significant tortuosity or vascular abnormality;   . Change in voice   . COPD (chronic obstructive pulmonary disease) ([place])   . Hearing loss   . Heart murmur   . Hyperlipidemia   . Hypertension    dr Gwenlyn Found  . Osteoporosis   . PAF (paroxysmal atrial fibrillation) ([place]) 09/04/2012  . PVD (peripheral vascular disease) ([place]) 08/13/2010   R/P MV - normal pattern of perfusion in all regions, EF 76%; no significant wall abnormalities noted; normal perfusion study  . Right carotid bruit    high-grade right external carotid artery stenosis by 2 parts ultrasound 2 years ago  . S/P aortic valve replacement with bioprosthetic valve 08/30/2012   21 mm [facility] Ease bovine pericardial tissue valve    Past Surgical History:  Procedure Laterality Date  . ABDOMINAL HYSTERECTOMY     Partial  . AORTIC VALVE REPLACEMENT N/A 08/30/2012   Procedure: AORTIC VALVE REPLACEMENT (AVR);  Surgeon: Rexene Alberts, MD;  Location: [place];  Service: Open Heart Surgery;  Laterality: N/A;  . BIOPSY SHOULDER Left 10/08/2005   shave biopsy  . BREAST LUMPECTOMY  02/25/1997   right  . CARDIAC CATHETERIZATION    . [place]  . COSMETIC SURGERY Left 1997   Breast implant  . EYE SURGERY  1997   Cataract surgery  . INTRAOPERATIVE TRANSESOPHAGEAL ECHOCARDIOGRAM N/A 08/30/2012   Procedure: INTRAOPERATIVE  TRANSESOPHAGEAL ECHOCARDIOGRAM;  Surgeon: Rexene Alberts, MD;  Location: [place];  Service: Open Heart Surgery;  Laterality: N/A;  . LEFT AND RIGHT HEART CATHETERIZATION WITH CORONARY ANGIOGRAM N/A 08/14/2012   Procedure: LEFT AND RIGHT HEART CATHETERIZATION WITH CORONARY ANGIOGRAM;  Surgeon: Lorretta Harp, MD;  Location: [facility] CATH LAB;  Service: Cardiovascular;  Laterality: N/A;  . LEFT HEART CATH  08/14/12   Nl cors, AS  . MASTECTOMY  09/29/95   left  . PARTIAL HYSTERECTOMY    . TOTAL HIP ARTHROPLASTY  09/2010    Patient Care Team: Gildardo Cranker, DO as PCP -  General (Internal Medicine) Druscilla Brownie, MD as Consulting Physician (Dermatology) [name] Irani, MD as Consulting Physician (Gastroenterology) Neldon Mc, MD as Consulting Physician (General Surgery) Lorretta Harp, MD as Consulting Physician (Cardiology) Bjorn Loser, MD as Consulting Physician (Urology) Lavell Anchors, MD as Consulting Physician (Ophthalmology)  Social History   Social History  . Marital status: Married    Spouse name: N/A  . Number of children: N/A  . Years of education: N/A   Occupational History  . Not on file.   Social History Main Topics  . Smoking status: Never Smoker  . Smokeless tobacco: Never Used  . Alcohol use No  . Drug use: No  . Sexual activity: No   Other Topics Concern  . Not on file   Social History Narrative  . No narrative on file     reports that she has never smoked. She has never used smokeless tobacco. She reports that she does not drink alcohol or use drugs.  Family History  Problem Relation Age of Onset  . Cancer Mother        Bladder  . Early death Father        Tractor accident  . Early death Brother        Radiation protection practitioner  . Early death Brother        during heart surgery   Family Status  Relation Status  . Mother Deceased at age 7       Cause of Death: Bladder cancer  . Father Deceased at age 15       Cause of Death: Accidental  . Brother Deceased at age 53       Cause of Death: Accidental  . Sister Deceased at age Birth       Cause of Death: Unknown  . Daughter Alive  . Son Alive  . Brother Deceased at age 34       Cause of Death: Complications of heart sugery     Allergies  Allergen Reactions  . Codeine Rash    All over the body.    Medications: Patient's Medications  New Prescriptions   No medications on file  Previous Medications   ACETAMINOPHEN (TYLENOL) 325 MG TABLET    Take 2 tablets (650 mg total) by mouth every 6 (six) hours as needed.   BIOTIN 5000 MCG CAPS    Take 1  capsule by mouth daily.   BUDESONIDE-FORMOTEROL (SYMBICORT) 160-4.5 MCG/ACT INHALER    Inhale 2 puffs into the lungs 2 (two) times daily.   CALCIUM CITRATE-VITAMIN D (CITRACAL + D PO)    Take 1 tablet by mouth 2 (two) times daily.    [facility] LORATADINE 10 MG TABLET    TAKE 1 TABLET (10 MG TOTAL) BY MOUTH DAILY.   FLUTICASONE (FLONASE) 50 MCG/ACT NASAL SPRAY    INHALE 1 SPRAY TWICE DAILY NOSTRIL(S)   HYDROXYZINE (ATARAX/VISTARIL)  25 MG TABLET    TAKE 1 TABLET BY MOUTH AS NEEDED   MAGNESIUM HYDROXIDE (PHILLIPS MILK OF MAGNESIA PO)    Take 15 mL by mouth. Take daily at 6 o'clock at night   METOPROLOL TARTRATE (LOPRESSOR) 25 MG TABLET    Take 1 tablet (25 mg total) by mouth 2 (two) times daily.   MIRABEGRON ER (MYRBETRIQ) 50 MG TB24 TABLET    Take 50 mg by mouth daily.   OMEPRAZOLE (PRILOSEC) 20 MG CAPSULE    TAKE 2 CAPSULES BY MOUTH DAILY FOR REFLUX   PROAIR HFA 108 (90 BASE) MCG/ACT INHALER    INHALE 1 PUFF INTO THE LUNGS EVERY 6 (SIX) HOURS AS NEEDED FOR WHEEZING OR SHORTNESS OF BREATH.   SHARK LIVER OIL-COCOA BUTTER (PREPARATION H) 0.25-3-85.5 % SUPPOSITORY    Place 1 suppository rectally as needed.   SODIUM CHLORIDE (OCEAN) 0.65 % SOLN NASAL SPRAY    Place 1 spray into both nostrils as needed for congestion.   TIOTROPIUM (SPIRIVA) 18 MCG INHALATION CAPSULE    Place 1 capsule (18 mcg total) into inhaler and inhale daily.   VALSARTAN (DIOVAN) 40 MG TABLET    Take 1 tablet (40 mg total) by mouth daily.   VITAMIN E 400 UNIT CAPSULE    Take 400 Units by mouth daily.  Modified Medications   No medications on file  Discontinued Medications   No medications on file    Review of Systems  Unable to perform ROS: Other (memory loss)    Vitals:   09/08/16 1422  BP: 118/68  Pulse: 64  Temp: 98.1 F (36.7 C)  TempSrc: Oral  SpO2: 98%  Weight: 169 lb (76.7 kg)  Height: 5\' 3"  (1.6 m)   Body mass index is 29.94 kg/m.  Physical Exam  Constitutional: She appears well-developed and well-nourished.    HENT:  Mouth/Throat: Oropharynx is clear and moist. No oropharyngeal exudate.  Eyes: Pupils are equal, round, and reactive to light. No scleral icterus.  Neck: Neck supple. Carotid bruit is present (systolic b/l from chest). No tracheal deviation present.  Cardiovascular: Normal rate, regular rhythm and intact distal pulses.  Exam reveals no gallop and no friction rub.   Murmur (1/6 SEM. opening snap of aortic valve) heard. Trace LE edema b/l. no calf TTP.   Pulmonary/Chest: Effort normal. No stridor. No respiratory distress. She has no wheezes. She has no rales.  Abdominal: Soft. Bowel sounds are normal. She exhibits no distension and no mass. There is no hepatomegaly. There is no tenderness. There is no rebound and no guarding.  Musculoskeletal: She exhibits edema (left ankle).  Lymphadenopathy:    She has no cervical adenopathy.  Neurological: She is alert.  Skin: Skin is warm and dry. No rash noted.  SK x 2 on lower back and 1 raised nodule on epigastric area  Psychiatric: She has a normal mood and affect. Her behavior is normal. Thought content normal.     Labs reviewed: Office Visit on 08/04/2016  Component Date Value Ref Range Status  . Glucose 09/07/2016 94  65 - 99 mg/dL Final  . BUN 09/07/2016 15  8 - 27 mg/dL Final  . Creatinine, Ser 09/07/2016 1.22* 0.57 - 1.00 mg/dL Final  . GFR calc non Af Amer 09/07/2016 41* >59 mL/min/1.73 Final  . GFR calc Af Amer 09/07/2016 47* >59 mL/min/1.73 Final  . BUN/Creatinine Ratio 09/07/2016 12  12 - 28 Final  . Sodium 09/07/2016 136  134 - 144 mmol/L Final  .  Potassium 09/07/2016 5.4* 3.5 - 5.2 mmol/L Final  . Chloride 09/07/2016 95* 96 - 106 mmol/L Final  . CO2 09/07/2016 24  20 - 29 mmol/L Final  . Calcium 09/07/2016 10.2  8.7 - 10.3 mg/dL Final    No results found.   Assessment/Plan   ICD-10-CM   1. Seborrheic keratosis L82.1 Ambulatory referral to Dermatology  2. Single skin nodule R22.9 Ambulatory referral to Dermatology   3. Left ankle swelling M25.472    s/p fall-->sprain in Jan 2018  4. CKD (chronic kidney disease) stage 3, GFR 30-59 ml/min N18.3   5. PAF (paroxysmal atrial fibrillation) (HCC) I48.0   6. Essential hypertension I10   7. Chronic bronchitis, unspecified chronic bronchitis type ([place]) J42   8. Laryngeal dystonia J38.3      Will discuss mammogram next OV. Last one in 2013 due to open heart sx. She will also need DXA scan at same time  Call with dermatology referral  Follow up with Cardiology as scheduled. Recommend repeat renal function test at next cardio visit  Continue current medications as ordered  Follow up with specialists as scheduled.  Follow up in 4 mos for COPD, HTN, vocal cord dystonia.  Ruston Fedora S. Perlie Gold  [facility] and [facility] [address] [place], [place] [address] [phone] Cell (Monday-Friday 8 AM - 5 PM) [phone] After 5 PM and follow prompts

## 2016-09-10 DIAGNOSIS — J385 Laryngeal spasm: Secondary | ICD-10-CM | POA: Diagnosis not present

## 2016-09-13 ENCOUNTER — Telehealth: Payer: Self-pay | Admitting: *Deleted

## 2016-09-13 DIAGNOSIS — E875 Hyperkalemia: Secondary | ICD-10-CM

## 2016-09-13 NOTE — Telephone Encounter (Addendum)
-----   Message from Lorretta Harp, MD sent at 09/12/2016 10:30 AM EDT ----- Renal fxn stable. K slightly elevated. Repeat 2 weeks   Spoke with pt, aware of lab results. Lab orders mailed to the pt

## 2016-09-28 DIAGNOSIS — E875 Hyperkalemia: Secondary | ICD-10-CM | POA: Diagnosis not present

## 2016-09-29 LAB — BASIC METABOLIC PANEL
BUN/Creatinine Ratio: 12 (ref 12–28)
BUN: 12 mg/dL (ref 8–27)
CO2: 25 mmol/L (ref 20–29)
Calcium: 10 mg/dL (ref 8.7–10.3)
Chloride: 101 mmol/L (ref 96–106)
Creatinine, Ser: 1.01 mg/dL — ABNORMAL HIGH (ref 0.57–1.00)
GFR calc Af Amer: 59 mL/min/{1.73_m2} — ABNORMAL LOW (ref >59)
GFR calc non Af Amer: 52 mL/min/{1.73_m2} — ABNORMAL LOW (ref >59)
Glucose: 95 mg/dL (ref 65–99)
Potassium: 5.2 mmol/L (ref 3.5–5.2)
Sodium: 137 mmol/L (ref 134–144)

## 2016-09-30 ENCOUNTER — Other Ambulatory Visit: Payer: Self-pay | Admitting: Cardiovascular Disease

## 2016-10-05 ENCOUNTER — Other Ambulatory Visit (HOSPITAL_BASED_OUTPATIENT_CLINIC_OR_DEPARTMENT_OTHER): Payer: Self-pay | Admitting: Internal Medicine

## 2016-10-05 ENCOUNTER — Ambulatory Visit (HOSPITAL_COMMUNITY): Payer: Medicare Other | Attending: Cardiology

## 2016-10-05 ENCOUNTER — Other Ambulatory Visit: Payer: Self-pay

## 2016-10-05 DIAGNOSIS — I081 Rheumatic disorders of both mitral and tricuspid valves: Secondary | ICD-10-CM | POA: Diagnosis not present

## 2016-10-05 DIAGNOSIS — Z952 Presence of prosthetic heart valve: Secondary | ICD-10-CM | POA: Insufficient documentation

## 2016-10-11 ENCOUNTER — Encounter: Payer: Self-pay | Admitting: Cardiovascular Disease

## 2016-10-12 ENCOUNTER — Telehealth: Payer: Self-pay | Admitting: Cardiovascular Disease

## 2016-10-12 DIAGNOSIS — D3610 Benign neoplasm of peripheral nerves and autonomic nervous system, unspecified: Secondary | ICD-10-CM | POA: Diagnosis not present

## 2016-10-12 DIAGNOSIS — L821 Other seborrheic keratosis: Secondary | ICD-10-CM | POA: Diagnosis not present

## 2016-10-12 DIAGNOSIS — L820 Inflamed seborrheic keratosis: Secondary | ICD-10-CM | POA: Diagnosis not present

## 2016-10-12 NOTE — Telephone Encounter (Signed)
New message    Pt is calling about her test results and lab results.

## 2016-10-12 NOTE — Telephone Encounter (Signed)
New message    Pt is calling about the results of her testing and lab work. Please call.

## 2016-10-14 ENCOUNTER — Other Ambulatory Visit: Payer: Self-pay

## 2016-10-14 DIAGNOSIS — I350 Nonrheumatic aortic (valve) stenosis: Secondary | ICD-10-CM

## 2016-10-14 NOTE — Telephone Encounter (Signed)
Called pt, results of labs and echo given. Pt verbalized understanding.

## 2016-10-15 ENCOUNTER — Telehealth: Payer: Self-pay | Admitting: *Deleted

## 2016-10-15 NOTE — Telephone Encounter (Signed)
lvm to schedule echo for Dr. Gwenlyn Found.

## 2016-10-19 ENCOUNTER — Ambulatory Visit (INDEPENDENT_AMBULATORY_CARE_PROVIDER_SITE_OTHER): Payer: Medicare Other | Admitting: Pharmacist

## 2016-10-19 VITALS — BP 128/70 | HR 62

## 2016-10-19 DIAGNOSIS — I6529 Occlusion and stenosis of unspecified carotid artery: Secondary | ICD-10-CM | POA: Diagnosis not present

## 2016-10-19 DIAGNOSIS — I10 Essential (primary) hypertension: Secondary | ICD-10-CM | POA: Diagnosis not present

## 2016-10-19 MED ORDER — LOSARTAN POTASSIUM 25 MG PO TABS: 25.0000 mg | ORAL_TABLET | Freq: Every day | ORAL | 1 refills | Status: DC

## 2016-10-19 NOTE — Patient Instructions (Addendum)
Return for a  follow up appointment in 4 weeks  Your blood pressure today is 128/70 pulse 62 (GOAL less then 140/90)  Check your blood pressure at home daily (if able) and keep record of the readings.  Take your BP meds as follows: *TAKE VALSARTAN 40MG  DAILY UNTIL ALL GONE , THEN START TAKING LOSARTAN 25MG *  Bring all of your meds, your BP cuff and your record of home blood pressures to your next appointment.  Exercise as you're able, try to walk approximately 30 minutes per day.  Keep salt intake to a minimum, especially watch canned and prepared boxed foods.  Eat more fresh fruits and vegetables and fewer canned items.  Avoid eating in fast food restaurants.    HOW TO TAKE YOUR BLOOD PRESSURE: . Rest 5 minutes before taking your blood pressure. .  Don't smoke or drink caffeinated beverages for at least 30 minutes before. . Take your blood pressure before (not after) you eat. . Sit comfortably with your back supported and both feet on the floor (don't cross your legs). . Elevate your arm to heart level on a table or a desk. . Use the proper sized cuff. It should fit smoothly and snugly around your bare upper arm. There should be enough room to slip a fingertip under the cuff. The bottom edge of the cuff should be 1 inch above the crease of the elbow. . Ideally, take 3 measurements at one sitting and record the average.

## 2016-10-19 NOTE — Progress Notes (Signed)
Patient ID: BEYONCA WISZ                 DOB: 1931-08-25                      MRN: [id number]     HPI: [name] is a 83 y.o. female referred by Dr. Gwenlyn Found to HTN clinic. PMH includes HTN, aortic stenosis, PAF, carotid stenosis, COPD, GERD  At her most recent visit with HTN clinic her lisinopril/HCTZ was changed to valsartan 40mg  due to reported cough with lisinopril. She presents today for HTN follow up and denies  Shortness of breath, dizziness, headaches or increase fatigue. Reports a significant improvement on cough symptoms as well.  BMET done on 09/28/2016 shows stable renal function and electrolytes within normal limits.  Current HTN meds:  Metoprolol tartrate 25mg  BID Valsartan 40mg  daily  BP goal: <140/90 given age   Family History: A brother died at age 71 from heart-related issues while he was being operated on.  Home BP readings:  21 readings; average 126/68; pulse 59-74bpm  Wt Readings from Last 3 Encounters:  09/08/16 169 lb (76.7 kg)  08/04/16 172 lb 3.2 oz (78.1 kg)  06/03/16 160 lb (72.6 kg)   BP Readings from Last 3 Encounters:  10/19/16 128/70  09/08/16 118/68  09/07/16 (!) 142/74   Pulse Readings from Last 3 Encounters:  10/19/16 62  09/08/16 64  09/07/16 64    Past Medical History:  Diagnosis Date  . Aortic stenosis 07/20/2012   Aortic stenosis   . Aortic valve disorder 08/13/2011   ECHO - EF >80%; mild diastolic dysfunction; calcified aortic valve, not well visualized; mod/severe aortic stenosis w/ worsening gradients when compared to 2012  . Arthritis   . Cancer ([place])    Breast  . Carotid bruit 08/06/2008   Doppler - R ECA demonstrates noarrowing w/ elevated velocities consistent w/ >70% diameter reduction; R and L ICAs show no evidence of diameter reduction, significant tortuosity or vascular abnormality;   . Change in voice   . COPD (chronic obstructive pulmonary disease) ([place])   . Hearing loss   . Heart murmur   . Hyperlipidemia   .  Hypertension    dr Gwenlyn Found  . Osteoporosis   . PAF (paroxysmal atrial fibrillation) ([place]) 09/04/2012  . PVD (peripheral vascular disease) ([place]) 08/13/2010   R/P MV - normal pattern of perfusion in all regions, EF 76%; no significant wall abnormalities noted; normal perfusion study  . Right carotid bruit    high-grade right external carotid artery stenosis by 2 parts ultrasound 2 years ago  . S/P aortic valve replacement with bioprosthetic valve 08/30/2012   21 mm [facility] Ease bovine pericardial tissue valve    Current Outpatient Prescriptions on File Prior to Visit  Medication Sig Dispense Refill  . acetaminophen (TYLENOL) 325 MG tablet Take 2 tablets (650 mg total) by mouth every 6 (six) hours as needed.    . Biotin 5000 MCG CAPS Take 1 capsule by mouth daily.    . budesonide-formoterol (SYMBICORT) 160-4.5 MCG/ACT inhaler Inhale 2 puffs into the lungs 2 (two) times daily. 1 Inhaler 3  . Calcium Citrate-Vitamin D (CITRACAL + D PO) Take 1 tablet by mouth 2 (two) times daily.     . [facility] LORATADINE 10 MG tablet TAKE 1 TABLET (10 MG TOTAL) BY MOUTH DAILY. 30 tablet 5  . fluticasone (FLONASE) 50 MCG/ACT nasal spray INHALE 1 SPRAY TWICE DAILY NOSTRIL(S) 16 g 1  .  Magnesium Hydroxide (PHILLIPS MILK OF MAGNESIA PO) Take 15 mL by mouth. Take daily at 6 o'clock at night    . metoprolol tartrate (LOPRESSOR) 25 MG tablet TAKE 1 TABLET (25 MG TOTAL) BY MOUTH 2 (TWO) TIMES DAILY. 60 tablet 9  . mirabegron ER (MYRBETRIQ) 50 MG TB24 tablet Take 50 mg by mouth daily.    Marland Kitchen omeprazole (PRILOSEC) 20 MG capsule TAKE 2 CAPSULES BY MOUTH DAILY FOR REFLUX 60 capsule 5  . PROAIR HFA 108 (90 Base) MCG/ACT inhaler INHALE 1 PUFF INTO THE LUNGS EVERY 6 (SIX) HOURS AS NEEDED FOR WHEEZING OR SHORTNESS OF BREATH. 8.5 Inhaler 2  . vitamin E 400 UNIT capsule Take 400 Units by mouth daily.    . hydrOXYzine (ATARAX/VISTARIL) 25 MG tablet TAKE 1 TABLET BY MOUTH AS NEEDED (Patient not taking: Reported on 10/19/2016) 30 tablet 5    . shark liver oil-cocoa butter (PREPARATION H) 0.25-3-85.5 % suppository Place 1 suppository rectally as needed.    . sodium chloride (OCEAN) 0.65 % SOLN nasal spray Place 1 spray into both nostrils as needed for congestion. (Patient not taking: Reported on 10/19/2016) 1 Bottle 1  . tiotropium (SPIRIVA) 18 MCG inhalation capsule Place 1 capsule (18 mcg total) into inhaler and inhale daily. (Patient not taking: Reported on 10/19/2016) 30 capsule 12   No current facility-administered medications on file prior to visit.     Allergies  Allergen Reactions  . Lisinopril Cough  . Codeine Rash    All over the body.    Blood pressure 128/70, pulse 62.  HTN (hypertension) [place] pressure remain well controlled in office and at home. No adverse reaction to drug noted either but this patient was affected by valsartan recall and currently unable to refill the medication. Will change valsartan 40 to losartan 25mg  daily and continue daily BP monitoring. Plan follow up with HTN clinic in 4 weeks to re-assess response to losartan.   [name] PharmD, [place] [id number] 10/20/2016 8:47 AM

## 2016-10-20 DIAGNOSIS — N3946 Mixed incontinence: Secondary | ICD-10-CM | POA: Diagnosis not present

## 2016-10-20 DIAGNOSIS — R350 Frequency of micturition: Secondary | ICD-10-CM | POA: Diagnosis not present

## 2016-10-20 NOTE — Assessment & Plan Note (Signed)
[place]oor pressure remain well controlled in office and at home. No adverse reaction to drug noted either but this patient was affected by valsartan recall and currently unable to refill the medication. Will change valsartan 40 to losartan 25mg  daily and continue daily BP monitoring. Plan follow up with HTN clinic in 4 weeks to re-assess response to losartan.

## 2016-11-03 ENCOUNTER — Other Ambulatory Visit: Payer: Self-pay | Admitting: Cardiovascular Disease

## 2016-11-16 ENCOUNTER — Ambulatory Visit (INDEPENDENT_AMBULATORY_CARE_PROVIDER_SITE_OTHER): Payer: Medicare Other | Admitting: Pharmacist Clinician (PhC)/ Clinical Pharmacy Specialist

## 2016-11-16 ENCOUNTER — Encounter: Payer: Self-pay | Admitting: Pharmacist Clinician (PhC)/ Clinical Pharmacy Specialist

## 2016-11-16 DIAGNOSIS — I6529 Occlusion and stenosis of unspecified carotid artery: Secondary | ICD-10-CM

## 2016-11-16 DIAGNOSIS — I10 Essential (primary) hypertension: Secondary | ICD-10-CM

## 2016-11-16 NOTE — Progress Notes (Signed)
Patient ID: [name]                 DOB: [birth year]                      MRN: [id number]     HPI: [name] is a 83 y.o. female referred by Dr. Gwenlyn Found to HTN clinic. PMH includes HTN, aortic stenosis, PAF, carotid stenosis, COPD and GERD.  Over the past several months her lisinopril/HCTZ was changed to valsartan 40mg  due to reported cough, then to losartan after the valsartan recall. She presents today for HTN follow up and denies shortness of breath, dizziness, headaches or increase fatigue.  She finished her last bottle of valsartan and switched to the losartan 25 mg just about 7-10 days ago.  BMET done on 09/28/2016 shows stable renal function and electrolytes within normal limits.  Patient was concerned about the high cost of losartan today, saying she paid $45 for a 30 day supply.  I spoke with a technician at her pharmacy, who assured me that her co-pay was $7, but that one of her other medications does come up at $45.    Current HTN meds:  Metoprolol tartrate 25mg  BID Losartan 25 mg daily am  BP goal: <140/90 given age   Family History: A brother died at age 29 from heart-related issues while he was being operated on.  Social History: no tobacco or alcohol; coffee in am occasional cup after dinner    Diet: adds salt only to few specific foods (ie. eggs, tomatoes)  Home BP readings: none with her today.  She does state that all home readings were between 315-400 systolic.   Home cuff is a wrist monitor from [place] - in lap read 181/84, at breast 160/75, at heart 156/77    HR 57  All readings on R arm due to prior breast cancer  Wt Readings from Last 3 Encounters:  09/08/16 169 lb (76.7 kg)  08/04/16 172 lb 3.2 oz (78.1 kg)  06/03/16 160 lb (72.6 kg)   BP Readings from Last 3 Encounters:  11/16/16 (!) 148/76  10/19/16 128/70  09/08/16 118/68   Pulse Readings from Last 3 Encounters:  11/16/16 60  10/19/16 62  09/08/16 64    Past Medical History:    Diagnosis Date  . Aortic stenosis 07/20/2012   Aortic stenosis   . Aortic valve disorder 08/13/2011   ECHO - EF >86%; mild diastolic dysfunction; calcified aortic valve, not well visualized; mod/severe aortic stenosis w/ worsening gradients when compared to 2012  . Arthritis   . Cancer ([place])    Breast  . Carotid bruit 08/06/2008   Doppler - R ECA demonstrates noarrowing w/ elevated velocities consistent w/ >70% diameter reduction; R and L ICAs show no evidence of diameter reduction, significant tortuosity or vascular abnormality;   . Change in voice   . COPD (chronic obstructive pulmonary disease) ([place])   . Hearing loss   . Heart murmur   . Hyperlipidemia   . Hypertension    dr Gwenlyn Found  . Osteoporosis   . PAF (paroxysmal atrial fibrillation) ([place]) 09/04/2012  . PVD (peripheral vascular disease) ([place]) 08/13/2010   R/P MV - normal pattern of perfusion in all regions, EF 76%; no significant wall abnormalities noted; normal perfusion study  . Right carotid bruit    high-grade right external carotid artery stenosis by 2 parts ultrasound 2 years ago  . S/P aortic valve replacement with bioprosthetic valve  08/30/2012   21 mm [facility] Ease bovine pericardial tissue valve    Current Outpatient Prescriptions on File Prior to Visit  Medication Sig Dispense Refill  . acetaminophen (TYLENOL) 325 MG tablet Take 2 tablets (650 mg total) by mouth every 6 (six) hours as needed.    . Biotin 5000 MCG CAPS Take 1 capsule by mouth daily.    . budesonide-formoterol (SYMBICORT) 160-4.5 MCG/ACT inhaler Inhale 2 puffs into the lungs 2 (two) times daily. 1 Inhaler 3  . Calcium Citrate-Vitamin D (CITRACAL + D PO) Take 1 tablet by mouth 2 (two) times daily.     . [facility] LORATADINE 10 MG tablet TAKE 1 TABLET (10 MG TOTAL) BY MOUTH DAILY. 30 tablet 5  . fluticasone (FLONASE) 50 MCG/ACT nasal spray INHALE 1 SPRAY TWICE DAILY NOSTRIL(S) 16 g 1  . hydrOXYzine (ATARAX/VISTARIL) 25 MG tablet TAKE 1 TABLET BY MOUTH AS  NEEDED (Patient not taking: Reported on 10/19/2016) 30 tablet 5  . losartan (COZAAR) 25 MG tablet Take 1 tablet (25 mg total) by mouth daily. Take place of valsartan 30 tablet 1  . Magnesium Hydroxide (PHILLIPS MILK OF MAGNESIA PO) Take 15 mL by mouth. Take daily at 6 o'clock at night    . metoprolol tartrate (LOPRESSOR) 25 MG tablet TAKE 1 TABLET (25 MG TOTAL) BY MOUTH 2 (TWO) TIMES DAILY. 60 tablet 9  . mirabegron ER (MYRBETRIQ) 50 MG TB24 tablet Take 50 mg by mouth daily.    Marland Kitchen omeprazole (PRILOSEC) 20 MG capsule TAKE 2 CAPSULES BY MOUTH DAILY FOR REFLUX 60 capsule 5  . PROAIR HFA 108 (90 Base) MCG/ACT inhaler INHALE 1 PUFF INTO THE LUNGS EVERY 6 (SIX) HOURS AS NEEDED FOR WHEEZING OR SHORTNESS OF BREATH. 8.5 Inhaler 2  . shark liver oil-cocoa butter (PREPARATION H) 0.25-3-85.5 % suppository Place 1 suppository rectally as needed.    . sodium chloride (OCEAN) 0.65 % SOLN nasal spray Place 1 spray into both nostrils as needed for congestion. (Patient not taking: Reported on 10/19/2016) 1 Bottle 1  . tiotropium (SPIRIVA) 18 MCG inhalation capsule Place 1 capsule (18 mcg total) into inhaler and inhale daily. (Patient not taking: Reported on 10/19/2016) 30 capsule 12  . valsartan (DIOVAN) 40 MG tablet TAKE 1 TABLET BY MOUTH EVERY DAY 30 tablet 8  . vitamin E 400 UNIT capsule Take 400 Units by mouth daily.     No current facility-administered medications on file prior to visit.     Allergies  Allergen Reactions  . Lisinopril Cough  . Codeine Rash    All over the body.    Blood pressure (!) 148/76, pulse 60. (home cuff read 8/1 points higher)  HTN (hypertension) Patient with essential hypertension, well controlled with home meter readings, slightly elevated in the office today.  Will have her continue with her current regimen and monitor home BP 3-4 times per week.  She knows to call the office should her pressure consistently read > 140.     Tommy Medal PharmD CPP [place]  [facility] [address] [place] [address] 11/16/2016 6:40 PM

## 2016-11-16 NOTE — Assessment & Plan Note (Signed)
Patient with essential hypertension, well controlled with home meter readings, slightly elevated in the office today.  Will have her continue with her current regimen and monitor home BP 3-4 times per week.  She knows to call the office should her pressure consistently read > 140.

## 2016-11-16 NOTE — Patient Instructions (Addendum)
  Your blood pressure today is 148/76 (goal is < 140/90)  Check your blood pressure at home several times each week and keep record of the readings.  Take your BP meds as follows:  Continue with metoprolol and losartan  Bring all of your meds, your BP cuff and your record of home blood pressures to your next appointment.  Exercise as you're able, try to walk approximately 30 minutes per day.  Keep salt intake to a minimum, especially watch canned and prepared boxed foods.  Eat more fresh fruits and vegetables and fewer canned items.  Avoid eating in fast food restaurants.    HOW TO TAKE YOUR BLOOD PRESSURE: . Rest 5 minutes before taking your blood pressure. .  Don't smoke or drink caffeinated beverages for at least 30 minutes before. . Take your blood pressure before (not after) you eat. . Sit comfortably with your back supported and both feet on the floor (don't cross your legs). . Elevate your arm to heart level on a table or a desk. . Use the proper sized cuff. It should fit smoothly and snugly around your bare upper arm. There should be enough room to slip a fingertip under the cuff. The bottom edge of the cuff should be 1 inch above the crease of the elbow. . Ideally, take 3 measurements at one sitting and record the average.

## 2016-12-16 DIAGNOSIS — L509 Urticaria, unspecified: Secondary | ICD-10-CM | POA: Diagnosis not present

## 2017-01-01 ENCOUNTER — Other Ambulatory Visit: Payer: Self-pay | Admitting: Internal Medicine

## 2017-01-11 ENCOUNTER — Ambulatory Visit (INDEPENDENT_AMBULATORY_CARE_PROVIDER_SITE_OTHER): Payer: Medicare Other | Admitting: Internal Medicine

## 2017-01-11 ENCOUNTER — Encounter: Payer: Self-pay | Admitting: Internal Medicine

## 2017-01-11 VITALS — BP 132/64 | HR 65 | Temp 98.4°F | Resp 16 | Ht 63.0 in | Wt 165.8 lb

## 2017-01-11 DIAGNOSIS — Z953 Presence of xenogenic heart valve: Secondary | ICD-10-CM | POA: Diagnosis not present

## 2017-01-11 DIAGNOSIS — Z23 Encounter for immunization: Secondary | ICD-10-CM

## 2017-01-11 DIAGNOSIS — I10 Essential (primary) hypertension: Secondary | ICD-10-CM

## 2017-01-11 DIAGNOSIS — J383 Other diseases of vocal cords: Secondary | ICD-10-CM

## 2017-01-11 DIAGNOSIS — I6529 Occlusion and stenosis of unspecified carotid artery: Secondary | ICD-10-CM

## 2017-01-11 DIAGNOSIS — Z79899 Other long term (current) drug therapy: Secondary | ICD-10-CM | POA: Diagnosis not present

## 2017-01-11 DIAGNOSIS — I480 Paroxysmal atrial fibrillation: Secondary | ICD-10-CM

## 2017-01-11 DIAGNOSIS — W19XXXA Unspecified fall, initial encounter: Secondary | ICD-10-CM | POA: Diagnosis not present

## 2017-01-11 DIAGNOSIS — J42 Unspecified chronic bronchitis: Secondary | ICD-10-CM | POA: Diagnosis not present

## 2017-01-11 NOTE — Patient Instructions (Addendum)
YOU NEED TO SPEAK WITH DR Gwenlyn Found ABOUT CHANGING YOUR VALSARTAN TO LOSARTAN DUE TO COST IS LESS  Flu shot given today  Continue other medications as ordered  Follow up with specialists as scheduled  Follow up in 4 mos AWV/CPE. Fasting labs prior to appt.

## 2017-01-11 NOTE — Progress Notes (Signed)
Patient ID: [name], female   DOB: 1931-07-16, 83 y.o.   MRN: [id number]    Location:  [facility] Place of Service: OFFICE  Chief Complaint  Patient presents with  . Medical Management of Chronic Issues    4 month follow up COPD,HTN and voval cords. No acute concerns at this time.   . Medication Refill    No refills needed at this time.   . Immunizations    Patient is interested in the flu shot today.     HPI:  83 yo female seen today for f/u. She reports she fell at the end of August in the shower. She hit the back of her head but denies LOC. She saw stars. She did not seek medical attention. She reports her head hurt for at least 4 days. No pain at this time.  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 She is followed by Dr Joya Gaskins at [facility]. Next injection due Nov 16th.  Allergic rhinitis - stable on flonase/loratidine  COPD - stable on symbicort and spiriva. Uses HFA prn. She has not had PFTs in several yrs. She stopped taking daliresp some time ago  HTN - controlled. Followed by cardio Dr Gwenlyn Found. Current meds consist of valsartan and metoprolol. She has been taking losartan due to recall on valsartan but then no longer on recall. She states cost of losartan < valsartan and she would like to change med   GERD - stable on prilosec 40 mg daily  OAB - stable on myrbetriq and is followed by urology, Dr Vikki Ports. She has stress incontinence especially when she coughs  PAF - rate controlled on metoprolol. Followed by cardio Dr Gwenlyn Found. She had a carotid study that showed stable stenosis. 2D echo showed nml EF in 2016 with stable prosthetic valve. Repeat 2D echo in Aug 2017 showed nml LV size and fxn with biatrial enlargement; bioprosthetic valve functioning well. She has a hx AV replacement. She does not take anticoagulation. LDL 76    Past Medical History:  Diagnosis Date  . Aortic stenosis 07/20/2012   Aortic stenosis   . Aortic valve disorder  08/13/2011   ECHO - EF >31%; mild diastolic dysfunction; calcified aortic valve, not well visualized; mod/severe aortic stenosis w/ worsening gradients when compared to 2012  . Arthritis   . Cancer ([place])    Breast  . Carotid bruit 08/06/2008   Doppler - R ECA demonstrates noarrowing w/ elevated velocities consistent w/ >70% diameter reduction; R and L ICAs show no evidence of diameter reduction, significant tortuosity or vascular abnormality;   . Change in voice   . COPD (chronic obstructive pulmonary disease) ([place])   . Hearing loss   . Heart murmur   . Hyperlipidemia   . Hypertension    dr Gwenlyn Found  . Osteoporosis   . PAF (paroxysmal atrial fibrillation) ([place]) 09/04/2012  . PVD (peripheral vascular disease) ([place]) 08/13/2010   R/P MV - normal pattern of perfusion in all regions, EF 76%; no significant wall abnormalities noted; normal perfusion study  . Right carotid bruit    high-grade right external carotid artery stenosis by 2 parts ultrasound 2 years ago  . S/P aortic valve replacement with bioprosthetic valve 08/30/2012   21 mm [facility] Ease bovine pericardial tissue valve    Past Surgical History:  Procedure Laterality Date  . ABDOMINAL HYSTERECTOMY     Partial  . BIOPSY SHOULDER Left 10/08/2005   shave biopsy  . BREAST LUMPECTOMY  02/25/1997   right  . CARDIAC CATHETERIZATION    . [place]  . COSMETIC SURGERY Left 1997   Breast implant  . EYE SURGERY  1997   Cataract surgery  . LEFT HEART CATH  08/14/12   Nl cors, AS  . MASTECTOMY  09/29/95   left  . PARTIAL HYSTERECTOMY    . TOTAL HIP ARTHROPLASTY  09/2010    Patient Care Team: Gildardo Cranker, DO as PCP - General (Internal Medicine) Druscilla Brownie, MD as Consulting Physician (Dermatology) [name] Irani, MD as Consulting Physician (Gastroenterology) Neldon Mc, MD as Consulting Physician (General Surgery) Lorretta Harp, MD as Consulting Physician (Cardiology) Bjorn Loser, MD as Consulting  Physician (Urology) Lavell Anchors, MD as Consulting Physician (Ophthalmology)  Social History   Socioeconomic History  . Marital status: Married    Spouse name: Not on file  . Number of children: Not on file  . Years of education: Not on file  . Highest education level: Not on file  Social Needs  . Financial resource strain: Not on file  . Food insecurity - worry: Not on file  . Food insecurity - inability: Not on file  . Transportation needs - medical: Not on file  . Transportation needs - non-medical: Not on file  Occupational History  . Not on file  Tobacco Use  . Smoking status: Never Smoker  . Smokeless tobacco: Never Used  Substance and Sexual Activity  . Alcohol use: No  . Drug use: No  . Sexual activity: No  Other Topics Concern  . Not on file  Social History Narrative  . Not on file     reports that  has never smoked. she has never used smokeless tobacco. She reports that she does not drink alcohol or use drugs.  Family History  Problem Relation Age of Onset  . Cancer Mother        Bladder  . Early death Father        Tractor accident  . Early death Brother        Radiation protection practitioner  . Early death Brother        during heart surgery   Family Status  Relation Name Status  . Mother  Deceased at age 32       Cause of Death: Bladder cancer  . Father  Deceased at age 57       Cause of Death: Accidental  . Brother  Deceased at age 78       Cause of Death: Accidental  . Sister  Deceased at age Birth       Cause of Death: Unknown  . Daughter [place] Alive  . Son [employer]  . Brother JC Deceased at age 36       Cause of Death: Complications of heart sugery     Allergies  Allergen Reactions  . Lisinopril Cough  . Codeine Rash    All over the body.    Medications:   Medication List        Accurate as of 01/11/17  2:05 PM. Always use your most recent med list.          acetaminophen 325 MG tablet Commonly known as:  TYLENOL Take 2  tablets (650 mg total) by mouth every 6 (six) hours as needed.   Biotin 5000 MCG Caps   budesonide-formoterol 160-4.5 MCG/ACT inhaler Commonly known as:  SYMBICORT Inhale 2 puffs into the lungs 2 (two) times daily.  CITRACAL + D PO   [facility]S LORATADINE 10 MG tablet Generic drug:  loratadine TAKE 1 TABLET (10 MG TOTAL) BY MOUTH DAILY.   fluticasone 50 MCG/ACT nasal spray Commonly known as:  FLONASE USE 1 SPRAY IN EACH NOSTRIL TWICE DAILY   hydrOXYzine 25 MG tablet Commonly known as:  ATARAX/VISTARIL TAKE 1 TABLET BY MOUTH AS NEEDED   losartan 25 MG tablet Commonly known as:  COZAAR Take 1 tablet (25 mg total) by mouth daily. Take place of valsartan   metoprolol tartrate 25 MG tablet Commonly known as:  LOPRESSOR TAKE 1 TABLET (25 MG TOTAL) BY MOUTH 2 (TWO) TIMES DAILY.   MYRBETRIQ 50 MG Tb24 tablet Generic drug:  mirabegron ER   omeprazole 20 MG capsule Commonly known as:  PRILOSEC TAKE 2 CAPSULES BY MOUTH DAILY FOR REFLUX   PHILLIPS MILK OF MAGNESIA PO   PROAIR HFA 108 (90 Base) MCG/ACT inhaler Generic drug:  albuterol INHALE 1 PUFF INTO THE LUNGS EVERY 6 (SIX) HOURS AS NEEDED FOR WHEEZING OR SHORTNESS OF BREATH.   shark liver oil-cocoa butter 0.25-3-85.5 % suppository Commonly known as:  PREPARATION H   sodium chloride 0.65 % Soln nasal spray Commonly known as:  OCEAN Place 1 spray into both nostrils as needed for congestion.   tiotropium 18 MCG inhalation capsule Commonly known as:  SPIRIVA Place 1 capsule (18 mcg total) into inhaler and inhale daily.   valsartan 40 MG tablet Commonly known as:  DIOVAN TAKE 1 TABLET BY MOUTH EVERY DAY   vitamin E 400 UNIT capsule       Review of Systems  HENT: Positive for voice change.   All other systems reviewed and are negative.   Vitals:   01/11/17 1339  BP: 132/64  Pulse: 65  Resp: 16  Temp: 98.4 F (36.9 C)  TempSrc: Oral  SpO2: 99%  Weight: 165 lb 12.8 oz (75.2 kg)  Height: 5' 3" (1.6 m)   Body  mass index is 29.37 kg/m.  Physical Exam  Constitutional: She is oriented to person, place, and time. She appears well-developed and well-nourished.  HENT:  Mouth/Throat: Oropharynx is clear and moist. No oropharyngeal exudate.  MMM; no oral thrush; voice is hoarse  Eyes: Pupils are equal, round, and reactive to light. No scleral icterus.  Neck: Neck supple. Carotid bruit is present (b/l systolic). No tracheal deviation present. No thyromegaly present.  Cardiovascular: Normal rate, regular rhythm and intact distal pulses. Exam reveals no gallop and no friction rub.  Murmur (2/6 SEM-->carotid b/l) heard. No LE edema b/l. no calf TTP.   Pulmonary/Chest: Effort normal and breath sounds normal. No stridor. No respiratory distress. She has no wheezes. She has no rales.  Abdominal: Soft. Normal appearance and bowel sounds are normal. She exhibits no distension and no mass. There is no hepatomegaly. There is no tenderness. There is no rigidity, no rebound and no guarding. No hernia.  Musculoskeletal: She exhibits edema.  Lymphadenopathy:    She has no cervical adenopathy.  Neurological: She is alert and oriented to person, place, and time. She has normal reflexes.  Skin: Skin is warm and dry. No rash noted.  Psychiatric: She has a normal mood and affect. Her behavior is normal. Judgment and thought content normal.     Labs reviewed: No visits with results within 3 Month(s) from this visit.  Latest known visit with results is:  Telephone on 09/13/2016  Component Date Value Ref Range Status  . Glucose 09/28/2016 95  65 - 99  mg/dL Final  . BUN 09/28/2016 12  8 - 27 mg/dL Final  . Creatinine, Ser 09/28/2016 1.01* 0.57 - 1.00 mg/dL Final  . GFR calc non Af Amer 09/28/2016 52* >59 mL/min/1.73 Final  . GFR calc Af Amer 09/28/2016 59* >59 mL/min/1.73 Final  . BUN/Creatinine Ratio 09/28/2016 12  12 - 28 Final  . Sodium 09/28/2016 137  134 - 144 mmol/L Final  . Potassium 09/28/2016 5.2  3.5 - 5.2  mmol/L Final  . Chloride 09/28/2016 101  96 - 106 mmol/L Final  . CO2 09/28/2016 25  20 - 29 mmol/L Final  . Calcium 09/28/2016 10.0  8.7 - 10.3 mg/dL Final    No results found.   Assessment/Plan   ICD-10-CM   1. 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 PAF (paroxysmal atrial fibrillation) (HCC) I48.0 CBC with Differential/Platelets    Lipid Panel    TSH  3. Fall, initial encounter W19.XXXA   4. Essential hypertension I10 CBC with Differential/Platelets    Lipid Panel  5. Chronic bronchitis, unspecified chronic bronchitis type ([place]) J42   6. S/P aortic valve replacement with bioprosthetic valve Z95.3 Lipid Panel  7. Need for immunization against influenza Z23 Flu Vaccine QUAD 36+ mos IM  8. High risk medication use Z79.899 CBC with Differential/Platelets    CMP with eGFR    Urinalysis with Reflex Microscopic    YOU NEED TO SPEAK WITH DR Gwenlyn Found ABOUT CHANGING YOUR VALSARTAN TO LOSARTAN DUE TO COST IS LESS  Flu shot given today  Continue other medications as ordered  Follow up with specialists as scheduled  Follow up in 4 mos AWV/CPE. Fasting labs prior to appt.    S. Perlie Gold  [facility] [address] [place], [place] [address] [phone] Cell (Monday-Friday 8 AM - 5 PM) [phone] After 5 PM and follow prompts

## 2017-01-14 DIAGNOSIS — J385 Laryngeal spasm: Secondary | ICD-10-CM | POA: Diagnosis not present

## 2017-01-24 ENCOUNTER — Telehealth: Payer: Self-pay | Admitting: Cardiovascular Disease

## 2017-01-24 MED ORDER — LOSARTAN POTASSIUM 25 MG PO TABS: 25.0000 mg | ORAL_TABLET | Freq: Every day | ORAL | 5 refills | Status: DC

## 2017-01-24 NOTE — Telephone Encounter (Signed)
Please call,concerning her medicine.

## 2017-01-24 NOTE — Telephone Encounter (Signed)
Returned call. Discussed concern w patient. She was switched from valsartan to losartan in august, but the valsartan was reauthorized sometime in September and she picked this medication up last time she went to pharmacy. She does not need, would like to remain on the losartan as it is cheaper and not associated with the drug recall.  Pt verbalized understanding that I would reauthorize the dispense of losartan 25mg  daily and to take this in place of the valsartan, as previously instructed. rx req sent to pharmacy electronically. Recommended she follow up with the pharmacy. Pt voiced thanks for call and assistance.

## 2017-02-09 ENCOUNTER — Ambulatory Visit: Payer: Medicare Other

## 2017-03-03 ENCOUNTER — Other Ambulatory Visit: Payer: Self-pay | Admitting: Internal Medicine

## 2017-03-08 DIAGNOSIS — R351 Nocturia: Secondary | ICD-10-CM | POA: Diagnosis not present

## 2017-03-08 DIAGNOSIS — N3946 Mixed incontinence: Secondary | ICD-10-CM | POA: Diagnosis not present

## 2017-03-17 ENCOUNTER — Telehealth: Payer: Self-pay

## 2017-03-17 NOTE — Telephone Encounter (Signed)
Called patient to try to schedule AWV in the office with either her lab or PCP appointment in March. No answer-left voicemail to call back.

## 2017-04-22 DIAGNOSIS — J385 Laryngeal spasm: Secondary | ICD-10-CM | POA: Diagnosis not present

## 2017-05-23 ENCOUNTER — Other Ambulatory Visit: Payer: Medicare Other

## 2017-05-23 ENCOUNTER — Ambulatory Visit (INDEPENDENT_AMBULATORY_CARE_PROVIDER_SITE_OTHER): Payer: Medicare Other

## 2017-05-23 VITALS — BP 158/78 | HR 66 | Temp 97.8°F | Ht 63.0 in | Wt 171.0 lb

## 2017-05-23 DIAGNOSIS — I10 Essential (primary) hypertension: Secondary | ICD-10-CM | POA: Diagnosis not present

## 2017-05-23 DIAGNOSIS — Z953 Presence of xenogenic heart valve: Secondary | ICD-10-CM | POA: Diagnosis not present

## 2017-05-23 DIAGNOSIS — I480 Paroxysmal atrial fibrillation: Secondary | ICD-10-CM | POA: Diagnosis not present

## 2017-05-23 DIAGNOSIS — Z0000 Encounter for general adult medical examination without abnormal findings: Secondary | ICD-10-CM

## 2017-05-23 DIAGNOSIS — Z79899 Other long term (current) drug therapy: Secondary | ICD-10-CM

## 2017-05-23 NOTE — Progress Notes (Signed)
Subjective:   [name] is a 84 y.o. female who presents for Medicare Annual (Subsequent) preventive examination.  Last AWV-02/04/2016       Objective:     Vitals: BP (!) 158/78 (BP Location: Right Arm, Patient Position: Sitting) Comment: hasn't taken BP meds  Pulse 66   Temp 97.8 F (36.6 C) (Oral)   Ht 5\' 3"  (1.6 m)   Wt 171 lb (77.6 kg)   SpO2 97%   BMI 30.29 kg/m   Body mass index is 30.29 kg/m.  Patient hasn't taken BP meds yet  Advanced Directives 05/23/2017 09/08/2016 05/05/2016 02/04/2016 09/24/2015 06/17/2015 05/14/2015  Does Patient Have a Medical Advance Directive? No No No No No No No  Would patient like information on creating a medical advance directive? Yes (MAU/Ambulatory/Procedural Areas - Information given) No - Patient declined No - Patient declined No - Patient declined No - patient declined information No - patient declined information Yes - Educational materials given  Pre-existing out of facility DNR order (yellow form or pink MOST form) - - - - - - -    Tobacco Social History   Tobacco Use  Smoking Status Never Smoker  Smokeless Tobacco Never Used     Counseling given: Not Answered   Clinical Intake:  Pre-visit preparation completed: No  Pain : No/denies pain     Nutritional Risks: None Diabetes: No  How often do you need to have someone help you when you read instructions, pamphlets, or other written materials from your doctor or pharmacy?: 1 - Never What is the last grade level you completed in school?: GED  Interpreter Needed?: No  Information entered by :: Tyson Dense, RN  Past Medical History:  Diagnosis Date  . Aortic stenosis 07/20/2012   Aortic stenosis   . Aortic valve disorder 08/13/2011   ECHO - EF >20%; mild diastolic dysfunction; calcified aortic valve, not well visualized; mod/severe aortic stenosis w/ worsening gradients when compared to 2012  . Arthritis   . Cancer ([place])    Breast  . Carotid bruit 08/06/2008   Doppler - R ECA demonstrates noarrowing w/ elevated velocities consistent w/ >70% diameter reduction; R and L ICAs show no evidence of diameter reduction, significant tortuosity or vascular abnormality;   . Change in voice   . COPD (chronic obstructive pulmonary disease) ([place])   . Hearing loss   . Heart murmur   . Hyperlipidemia   . Hypertension    dr Gwenlyn Found  . Osteoporosis   . PAF (paroxysmal atrial fibrillation) ([place]) 09/04/2012  . PVD (peripheral vascular disease) ([place]) 08/13/2010   R/P MV - normal pattern of perfusion in all regions, EF 76%; no significant wall abnormalities noted; normal perfusion study  . Right carotid bruit    high-grade right external carotid artery stenosis by 2 parts ultrasound 2 years ago  . S/P aortic valve replacement with bioprosthetic valve 08/30/2012   21 mm [facility] Ease bovine pericardial tissue valve   Past Surgical History:  Procedure Laterality Date  . ABDOMINAL HYSTERECTOMY     Partial  . AORTIC VALVE REPLACEMENT N/A 08/30/2012   Procedure: AORTIC VALVE REPLACEMENT (AVR);  Surgeon: Rexene Alberts, MD;  Location: [place];  Service: Open Heart Surgery;  Laterality: N/A;  . BIOPSY SHOULDER Left 10/08/2005   shave biopsy  . BREAST LUMPECTOMY  02/25/1997   right  . CARDIAC CATHETERIZATION    . [place]  . COSMETIC SURGERY Left 1997   Breast implant  .  EYE SURGERY  1997   Cataract surgery  . INTRAOPERATIVE TRANSESOPHAGEAL ECHOCARDIOGRAM N/A 08/30/2012   Procedure: INTRAOPERATIVE TRANSESOPHAGEAL ECHOCARDIOGRAM;  Surgeon: Rexene Alberts, MD;  Location: [place];  Service: Open Heart Surgery;  Laterality: N/A;  . LEFT AND RIGHT HEART CATHETERIZATION WITH CORONARY ANGIOGRAM N/A 08/14/2012   Procedure: LEFT AND RIGHT HEART CATHETERIZATION WITH CORONARY ANGIOGRAM;  Surgeon: Lorretta Harp, MD;  Location: [facility] CATH LAB;  Service: Cardiovascular;  Laterality: N/A;  . LEFT HEART CATH  08/14/12   Nl cors, AS  . MASTECTOMY  09/29/95   left  . PARTIAL  HYSTERECTOMY    . TOTAL HIP ARTHROPLASTY  09/2010   Family History  Problem Relation Age of Onset  . Cancer Mother        Bladder  . Early death Father        Tractor accident  . Early death Brother        Radiation protection practitioner  . Early death Brother        during heart surgery   Social History   Socioeconomic History  . Marital status: Widowed    Spouse name: Not on file  . Number of children: Not on file  . Years of education: Not on file  . Highest education level: Not on file  Occupational History  . Not on file  Social Needs  . Financial resource strain: Not hard at all  . Food insecurity:    Worry: Never true    Inability: Never true  . Transportation needs:    Medical: No    Non-medical: No  Tobacco Use  . Smoking status: Never Smoker  . Smokeless tobacco: Never Used  Substance and Sexual Activity  . Alcohol use: No  . Drug use: No  . Sexual activity: Never  Lifestyle  . Physical activity:    Days per week: 7 days    Minutes per session: 10 min  . Stress: Only a little  Relationships  . Social connections:    Talks on phone: More than three times a week    Gets together: More than three times a week    Attends religious service: More than 4 times per year    Active member of club or organization: No    Attends meetings of clubs or organizations: Never    Relationship status: Widowed  Other Topics Concern  . Not on file  Social History Narrative  . Not on file    Outpatient Encounter Medications as of 05/23/2017  Medication Sig  . acetaminophen (TYLENOL) 325 MG tablet Take 2 tablets (650 mg total) by mouth every 6 (six) hours as needed.  Marland Kitchen albuterol (PROAIR HFA) 108 (90 Base) MCG/ACT inhaler INHALE 1 PUFF INTO THE LUNGS EVERY 6 (SIX) HOURS AS NEEDED FOR WHEEZING OR SHORTNESS OF BREATH.  Marland Kitchen Biotin 5000 MCG CAPS Take 1 capsule by mouth daily.  . budesonide-formoterol (SYMBICORT) 160-4.5 MCG/ACT inhaler Inhale 2 puffs into the lungs 2 (two) times daily.  . Calcium  Citrate-Vitamin D (CITRACAL + D PO) Take 1 tablet by mouth 2 (two) times daily.   . [employer] LORATADINE 10 MG tablet TAKE 1 TABLET (10 MG TOTAL) BY MOUTH DAILY.  . fluticasone (FLONASE) 50 MCG/ACT nasal spray USE 1 SPRAY IN EACH NOSTRIL TWICE DAILY  . hydrOXYzine (ATARAX/VISTARIL) 25 MG tablet TAKE 1 TABLET BY MOUTH AS NEEDED  . losartan (COZAAR) 25 MG tablet Take 1 tablet (25 mg total) by mouth daily. Take place of valsartan  . Magnesium  Hydroxide (PHILLIPS MILK OF MAGNESIA PO) Take 15 mL by mouth. Take daily at 6 o'clock at night  . metoprolol tartrate (LOPRESSOR) 25 MG tablet TAKE 1 TABLET (25 MG TOTAL) BY MOUTH 2 (TWO) TIMES DAILY.  . mirabegron ER (MYRBETRIQ) 50 MG TB24 tablet Take 50 mg by mouth daily.  Marland Kitchen omeprazole (PRILOSEC) 20 MG capsule TAKE 2 CAPSULES BY MOUTH DAILY FOR REFLUX  . shark liver oil-cocoa butter (PREPARATION H) 0.25-3-85.5 % suppository Place 1 suppository rectally as needed.  . sodium chloride (OCEAN) 0.65 % SOLN nasal spray Place 1 spray into both nostrils as needed for congestion.  Marland Kitchen tiotropium (SPIRIVA) 18 MCG inhalation capsule Place 1 capsule (18 mcg total) into inhaler and inhale daily.  . vitamin E 400 UNIT capsule Take 400 Units by mouth daily.  Marland Kitchen Zoster Vaccine Adjuvanted [facility]) injection Inject 0.5 mLs into the muscle once.   No facility-administered encounter medications on file as of 05/23/2017.     Activities of Daily Living In your present state of health, do you have any difficulty performing the following activities: 05/23/2017  Hearing? Y  Vision? N  Difficulty concentrating or making decisions? N  Walking or climbing stairs? N  Dressing or bathing? N  Doing errands, shopping? N  Preparing Food and eating ? N  Using the Toilet? N  In the past six months, have you accidently leaked urine? Y  Comment stress incontinence, wears pads  Do you have problems with loss of bowel control? N  Managing your Medications? N  Managing your Finances? N  Some  recent data might be hidden    Patient Care Team: Gildardo Cranker, DO as PCP - General (Internal Medicine) Druscilla Brownie, MD as Consulting Physician (Dermatology) [name] Irani, MD (Inactive) as Consulting Physician (Gastroenterology) Neldon Mc, MD as Consulting Physician (General Surgery) Lorretta Harp, MD as Consulting Physician (Cardiology) Bjorn Loser, MD as Consulting Physician (Urology) Lavell Anchors, MD as Consulting Physician (Ophthalmology)    Assessment:   This is a routine wellness examination for [place].  Exercise Activities and Dietary recommendations Current Exercise Habits: Home exercise routine, Type of exercise: Other - see comments(air squats), Time (Minutes): 10, Frequency (Times/Week): 7, Weekly Exercise (Minutes/Week): 70, Intensity: Mild, Exercise limited by: None identified  Goals    None      Fall Risk Fall Risk  05/23/2017 09/08/2016 05/05/2016 02/04/2016 09/24/2015  Falls in the past year? No Yes Yes No No  Number falls in past yr: - 1 1 - -  Injury with Fall? - Yes Yes - -   Is the patient's home free of loose throw rugs in walkways, pet beds, electrical cords, etc?   yes      Grab bars in the bathroom? yes      Handrails on the stairs?   yes      Adequate lighting?   yes  Depression Screen PHQ 2/9 Scores 05/23/2017 02/04/2016 09/24/2015 01/01/2014  PHQ - 2 Score 1 0 0 0     Cognitive Function MMSE - Mini Mental State Exam 05/23/2017 02/04/2016 11/27/2014  Orientation to time 5 5 5   Orientation to Place 5 5 5   Registration 3 3 3   Attention/ Calculation 5 4 4   Recall 1 2 2   Language- name 2 objects 2 2 2   Language- repeat 1 1 1   Language- follow 3 step command 3 3 3   Language- read & follow direction 1 0 1  Write a sentence 1 0 1  Copy design 1 1 1  Total score 28 26 28         Immunization History  Administered Date(s) Administered  . Influenza,inj,Quad PF,6+ Mos 11/29/2012, 11/27/2013, 11/27/2014, 12/09/2015, 01/11/2017  .  Pneumococcal Conjugate-13 05/21/2014  . Pneumococcal Polysaccharide-23 01/11/2005, 08/15/2012  . Tdap 05/28/2014  . Zoster 01/14/2009    Qualifies for Shingles Vaccine? Yes, educated and prescription sent to pharmacy  Screening Tests Health Maintenance  Topic Date Due  . TETANUS/TDAP  05/27/2024  . INFLUENZA VACCINE  Completed  . DEXA SCAN  Completed  . PNA vac Low Risk Adult  Completed    Cancer Screenings: Lung: Low Dose CT Chest recommended if Age 47-80 years, 30 pack-year currently smoking OR have quit w/in 15years. Patient does not qualify. Breast:  Up to date on Mammogram? Yes   Up to date of Bone Density/Dexa? Yes Colorectal: up to date  Additional Screenings:  Hepatitis C Screening: declined     Plan:    I have personally reviewed and addressed the Medicare Annual Wellness questionnaire and have noted the following in the patient's chart:  A. Medical and social history B. Use of alcohol, tobacco or illicit drugs  C. Current medications and supplements D. Functional ability and status E.  Nutritional status F.  Physical activity G. Advance directives H. List of other physicians I.  Hospitalizations, surgeries, and ER visits in previous 12 months J.  [place] to include hearing, vision, cognitive, depression L. Referrals and appointments - none  In addition, I have reviewed and discussed with patient certain preventive protocols, quality metrics, and best practice recommendations. A written personalized care plan for preventive services as well as general preventive health recommendations were provided to patient.  See attached scanned questionnaire for additional information.   Signed,   Tyson Dense, RN Nurse Health Advisor  Patient Concerns: None

## 2017-05-23 NOTE — Patient Instructions (Signed)
Ms. [name] , Thank you for taking time to come for your Medicare Wellness Visit. I appreciate your ongoing commitment to your health goals. Please review the following plan we discussed and let me know if I can assist you in the future.   Screening recommendations/referrals: Colonoscopy excluded, you are over age 82 Mammogram excluded, you are over age 36 Bone Density up to date Recommended yearly ophthalmology/optometry visit for glaucoma screening and checkup Recommended yearly dental visit for hygiene and checkup  Vaccinations: Influenza vaccine up to date, due 2019 fall season Pneumococcal vaccine up to date, completed Tdap vaccine up to date, due 05/27/2024 Shingles vaccine due, prescription sent to pharmacy    Advanced directives: Please bring [place] a copy of your living will and health care power of attorney once you fill them out  Conditions/risks identified: none  Next appointment: Dr. Eulas Post 05/27/2017 @ 12:30pm            Tyson Dense, RN 05/26/2018 @ 8:30am   Preventive Care 65 Years and Older, Female Preventive care refers to lifestyle choices and visits with your health care provider that can promote health and wellness. What does preventive care include?  A yearly physical exam. This is also called an annual well check.  Dental exams once or twice a year.  Routine eye exams. Ask your health care provider how often you should have your eyes checked.  Personal lifestyle choices, including:  Daily care of your teeth and gums.  Regular physical activity.  Eating a healthy diet.  Avoiding tobacco and drug use.  Limiting alcohol use.  Practicing safe sex.  Taking low-dose aspirin every day.  Taking vitamin and mineral supplements as recommended by your health care provider. What happens during an annual well check? The services and screenings done by your health care provider during your annual well check will depend on your age, overall health, lifestyle risk  factors, and family history of disease. Counseling  Your health care provider may ask you questions about your:  Alcohol use.  Tobacco use.  Drug use.  Emotional well-being.  Home and relationship well-being.  Sexual activity.  Eating habits.  History of falls.  Memory and ability to understand (cognition).  Work and work Statistician.  Reproductive health. Screening  You may have the following tests or measurements:  Height, weight, and BMI.  Blood pressure.  Lipid and cholesterol levels. These may be checked every 5 years, or more frequently if you are over 87 years old.  Skin check.  Lung cancer screening. You may have this screening every year starting at age 3 if you have a 30-pack-year history of smoking and currently smoke or have quit within the past 15 years.  Fecal occult blood test (FOBT) of the stool. You may have this test every year starting at age 65.  Flexible sigmoidoscopy or colonoscopy. You may have a sigmoidoscopy every 5 years or a colonoscopy every 10 years starting at age 77.  Hepatitis C blood test.  Hepatitis B blood test.  Sexually transmitted disease (STD) testing.  Diabetes screening. This is done by checking your blood sugar (glucose) after you have not eaten for a while (fasting). You may have this done every 1-3 years.  Bone density scan. This is done to screen for osteoporosis. You may have this done starting at age 66.  Mammogram. This may be done every 1-2 years. Talk to your health care provider about how often you should have regular mammograms. Talk with your health care provider  about your test results, treatment options, and if necessary, the need for more tests. Vaccines  Your health care provider may recommend certain vaccines, such as:  Influenza vaccine. This is recommended every year.  Tetanus, diphtheria, and acellular pertussis (Tdap, Td) vaccine. You may need a Td booster every 10 years.  Zoster vaccine. You  may need this after age 14.  Pneumococcal 13-valent conjugate (PCV13) vaccine. One dose is recommended after age 20.  Pneumococcal polysaccharide (PPSV23) vaccine. One dose is recommended after age 103. Talk to your health care provider about which screenings and vaccines you need and how often you need them. This information is not intended to replace advice given to you by your health care provider. Make sure you discuss any questions you have with your health care provider. Document Released: 03/14/2015 Document Revised: 11/05/2015 Document Reviewed: 12/17/2014 Elsevier Interactive Patient Education  2017 [place] Prevention in the Home Falls can cause injuries. They can happen to people of all ages. There are many things you can do to make your home safe and to help prevent falls. What can I do on the outside of my home?  Regularly fix the edges of walkways and driveways and fix any cracks.  Remove anything that might make you trip as you walk through a door, such as a raised step or threshold.  Trim any bushes or trees on the path to your home.  Use bright outdoor lighting.  Clear any walking paths of anything that might make someone trip, such as rocks or tools.  Regularly check to see if handrails are loose or broken. Make sure that both sides of any steps have handrails.  Any raised decks and porches should have guardrails on the edges.  Have any leaves, snow, or ice cleared regularly.  Use sand or salt on walking paths during winter.  Clean up any spills in your garage right away. This includes oil or grease spills. What can I do in the bathroom?  Use night lights.  Install grab bars by the toilet and in the tub and shower. Do not use towel bars as grab bars.  Use non-skid mats or decals in the tub or shower.  If you need to sit down in the shower, use a plastic, non-slip stool.  Keep the floor dry. Clean up any water that spills on the floor as soon as  it happens.  Remove soap buildup in the tub or shower regularly.  Attach bath mats securely with double-sided non-slip rug tape.  Do not have throw rugs and other things on the floor that can make you trip. What can I do in the bedroom?  Use night lights.  Make sure that you have a light by your bed that is easy to reach.  Do not use any sheets or blankets that are too big for your bed. They should not hang down onto the floor.  Have a firm chair that has side arms. You can use this for support while you get dressed.  Do not have throw rugs and other things on the floor that can make you trip. What can I do in the kitchen?  Clean up any spills right away.  Avoid walking on wet floors.  Keep items that you use a lot in easy-to-reach places.  If you need to reach something above you, use a strong step stool that has a grab bar.  Keep electrical cords out of the way.  Do not use floor polish or  wax that makes floors slippery. If you must use wax, use non-skid floor wax.  Do not have throw rugs and other things on the floor that can make you trip. What can I do with my stairs?  Do not leave any items on the stairs.  Make sure that there are handrails on both sides of the stairs and use them. Fix handrails that are broken or loose. Make sure that handrails are as long as the stairways.  Check any carpeting to make sure that it is firmly attached to the stairs. Fix any carpet that is loose or worn.  Avoid having throw rugs at the top or bottom of the stairs. If you do have throw rugs, attach them to the floor with carpet tape.  Make sure that you have a light switch at the top of the stairs and the bottom of the stairs. If you do not have them, ask someone to add them for you. What else can I do to help prevent falls?  Wear shoes that:  Do not have high heels.  Have rubber bottoms.  Are comfortable and fit you well.  Are closed at the toe. Do not wear sandals.  If  you use a stepladder:  Make sure that it is fully opened. Do not climb a closed stepladder.  Make sure that both sides of the stepladder are locked into place.  Ask someone to hold it for you, if possible.  Clearly mark and make sure that you can see:  Any grab bars or handrails.  First and last steps.  Where the edge of each step is.  Use tools that help you move around (mobility aids) if they are needed. These include:  Canes.  Walkers.  Scooters.  Crutches.  Turn on the lights when you go into a dark area. Replace any light bulbs as soon as they burn out.  Set up your furniture so you have a clear path. Avoid moving your furniture around.  If any of your floors are uneven, fix them.  If there are any pets around you, be aware of where they are.  Review your medicines with your doctor. Some medicines can make you feel dizzy. This can increase your chance of falling. Ask your doctor what other things that you can do to help prevent falls. This information is not intended to replace advice given to you by your health care provider. Make sure you discuss any questions you have with your health care provider. Document Released: 12/12/2008 Document Revised: 07/24/2015 Document Reviewed: 03/22/2014 Elsevier Interactive Patient Education  2017 [employer].

## 2017-05-24 LAB — CBC WITH DIFFERENTIAL/PLATELET
Basophils Absolute: 72 cells/uL (ref 0–200)
Basophils Relative: 1.2 %
Eosinophils Absolute: 198 cells/uL (ref 15–500)
Eosinophils Relative: 3.3 %
HCT: 34.8 % — ABNORMAL LOW (ref 35.0–45.0)
Hemoglobin: 12.1 g/dL (ref 11.7–15.5)
Lymphs Abs: 1800 cells/uL (ref 850–3900)
MCH: 32.6 pg (ref 27.0–33.0)
MCHC: 34.8 g/dL (ref 32.0–36.0)
MCV: 93.8 fL (ref 80.0–100.0)
MPV: 10.3 fL (ref 7.5–12.5)
Monocytes Relative: 8.8 %
Neutro Abs: 3402 cells/uL (ref 1500–7800)
Neutrophils Relative %: 56.7 %
Platelets: 191 10*3/uL (ref 140–400)
RBC: 3.71 10*6/uL — ABNORMAL LOW (ref 3.80–5.10)
RDW: 12 % (ref 11.0–15.0)
Total Lymphocyte: 30 %
WBC mixed population: 528 cells/uL (ref 200–950)
WBC: 6 10*3/uL (ref 3.8–10.8)

## 2017-05-24 LAB — COMPLETE METABOLIC PANEL WITH GFR
AG Ratio: 2.3 (calc) (ref 1.0–2.5)
ALT: 11 U/L (ref 6–29)
AST: 16 U/L (ref 10–35)
Albumin: 4.1 g/dL (ref 3.6–5.1)
Alkaline phosphatase (APISO): 53 U/L (ref 33–130)
BUN/Creatinine Ratio: 13 (calc) (ref 6–22)
BUN: 15 mg/dL (ref 7–25)
CO2: 31 mmol/L (ref 20–32)
Calcium: 9.7 mg/dL (ref 8.6–10.4)
Chloride: 104 mmol/L (ref 98–110)
Creat: 1.14 mg/dL — ABNORMAL HIGH (ref 0.60–0.88)
GFR, Est African American: 51 mL/min/{1.73_m2} — ABNORMAL LOW (ref >=60)
GFR, Est Non African American: 44 mL/min/{1.73_m2} — ABNORMAL LOW (ref >=60)
Globulin: 1.8 g/dL (calc) — ABNORMAL LOW (ref 1.9–3.7)
Glucose, Bld: 97 mg/dL (ref 65–99)
Potassium: 4.5 mmol/L (ref 3.5–5.3)
Sodium: 142 mmol/L (ref 135–146)
Total Bilirubin: 0.5 mg/dL (ref 0.2–1.2)
Total Protein: 5.9 g/dL — ABNORMAL LOW (ref 6.1–8.1)

## 2017-05-24 LAB — URINALYSIS, ROUTINE W REFLEX MICROSCOPIC
Bilirubin Urine: NEGATIVE
Glucose, UA: NEGATIVE
Hgb urine dipstick: NEGATIVE
Ketones, ur: NEGATIVE
Leukocytes, UA: NEGATIVE
Nitrite: NEGATIVE
Protein, ur: NEGATIVE
Specific Gravity, Urine: 1.02 (ref 1.001–1.03)
pH: 7 (ref 5.0–8.0)

## 2017-05-24 LAB — LIPID PANEL
Cholesterol: 146 mg/dL (ref <200)
HDL: 52 mg/dL (ref >50)
LDL Cholesterol (Calc): 79 mg/dL (calc)
Non-HDL Cholesterol (Calc): 94 mg/dL (calc) (ref <130)
Total CHOL/HDL Ratio: 2.8 (calc) (ref <5.0)
Triglycerides: 72 mg/dL (ref <150)

## 2017-05-24 LAB — TSH: TSH: 1.5 mIU/L (ref 0.40–4.50)

## 2017-05-25 ENCOUNTER — Telehealth: Payer: Self-pay

## 2017-05-25 ENCOUNTER — Encounter: Payer: Medicare Other | Admitting: Internal Medicine

## 2017-05-25 NOTE — Telephone Encounter (Signed)
Discussed labs results with patient, per Dr.Carter, labs are stable on current medications, follow-up as scheduled.  Patient verbalized understanding of results. Copy of labs mailed to patient, per patient request.

## 2017-05-27 ENCOUNTER — Encounter: Payer: Medicare Other | Admitting: Internal Medicine

## 2017-06-14 ENCOUNTER — Telehealth: Payer: Self-pay | Admitting: *Deleted

## 2017-06-14 NOTE — Telephone Encounter (Signed)
Patient daughter, [name] called and stated that patient is requesting a letter signed by Dr. Eulas Post for approval for her to get her botox injection. Stated that patient had received a letter from Dr. Bettina Gavia office stating she needed approval from Dr. Eulas Post in order for her to get it.   I called Dr. Bettina Gavia office and spoke with Amy and she stated that the letter the patient received was for Insurance authorization which they have initiated through patient's insurance. She stated that they did not need anything from our office.   I called and explained to [place], daughter and she agreed.

## 2017-06-15 DIAGNOSIS — M19019 Primary osteoarthritis, unspecified shoulder: Secondary | ICD-10-CM | POA: Insufficient documentation

## 2017-06-15 DIAGNOSIS — M19011 Primary osteoarthritis, right shoulder: Secondary | ICD-10-CM | POA: Diagnosis not present

## 2017-06-15 DIAGNOSIS — G8929 Other chronic pain: Secondary | ICD-10-CM | POA: Insufficient documentation

## 2017-07-13 ENCOUNTER — Other Ambulatory Visit: Payer: Self-pay | Admitting: Cardiovascular Disease

## 2017-07-13 ENCOUNTER — Telehealth: Payer: Self-pay | Admitting: Cardiovascular Disease

## 2017-07-13 NOTE — Telephone Encounter (Signed)
Received Progress Notes from [facility] on 07/13/17, Appt 08/05/17 @ 11:15AM. [place]

## 2017-07-26 ENCOUNTER — Encounter: Payer: Self-pay | Admitting: Nurse Practitioner

## 2017-07-26 ENCOUNTER — Ambulatory Visit (INDEPENDENT_AMBULATORY_CARE_PROVIDER_SITE_OTHER): Payer: Medicare Other | Admitting: Nurse Practitioner

## 2017-07-26 VITALS — BP 134/76 | HR 72 | Temp 98.9°F | Ht 63.0 in | Wt 167.0 lb

## 2017-07-26 DIAGNOSIS — J0140 Acute pansinusitis, unspecified: Secondary | ICD-10-CM | POA: Diagnosis not present

## 2017-07-26 DIAGNOSIS — J441 Chronic obstructive pulmonary disease with (acute) exacerbation: Secondary | ICD-10-CM | POA: Diagnosis not present

## 2017-07-26 MED ORDER — FLUTICASONE PROPIONATE 50 MCG/ACT NA SUSP: 1.0000 | Freq: Two times a day (BID) | NASAL | 1 refills | Status: DC

## 2017-07-26 MED ORDER — DOXYCYCLINE HYCLATE 100 MG PO TABS: 100.0000 mg | ORAL_TABLET | Freq: Two times a day (BID) | ORAL | 0 refills | Status: DC

## 2017-07-26 MED ORDER — PREDNISONE 10 MG (21) PO TBPK: ORAL_TABLET | ORAL | 0 refills | Status: DC

## 2017-07-26 MED ORDER — ALBUTEROL SULFATE HFA 108 (90 BASE) MCG/ACT IN AERS: INHALATION_SPRAY | RESPIRATORY_TRACT | 2 refills | Status: DC

## 2017-07-26 NOTE — Patient Instructions (Signed)
neti pot twice daily Plain nasal saline spray throughout the day as needed May use tylenol 325 mg 2 tablets every 6 hours as needed aches and pains or sore throat humidifier in the home to help with the dry air Mucinex DM by mouth twice daily as needed for cough and congestion with full glass of water  Keep well hydrated Avoid forcefully blowing nose flonase 1 spray into each nare twice daily prednisone has directed Doxycycline 100 mg by mouth twice daily - take with food to avoid GI upset Probiotic twice daily  Albuterol as needed for shortness of breath/wheezing/cough    Sinusitis, Adult Sinusitis is soreness and inflammation of your sinuses. Sinuses are hollow spaces in the bones around your face. Your sinuses are located:  Around your eyes.  In the middle of your forehead.  Behind your nose.  In your cheekbones.  Your sinuses and nasal passages are lined with a stringy fluid (mucus). Mucus normally drains out of your sinuses. When your nasal tissues become inflamed or swollen, the mucus can become trapped or blocked so air cannot flow through your sinuses. This allows bacteria, viruses, and funguses to grow, which leads to infection. Sinusitis can develop quickly and last for 7?10 days (acute) or for more than 12 weeks (chronic). Sinusitis often develops after a cold. What are the causes? This condition is caused by anything that creates swelling in the sinuses or stops mucus from draining, including:  Allergies.  Asthma.  Bacterial or viral infection.  Abnormally shaped bones between the nasal passages.  Nasal growths that contain mucus (nasal polyps).  Narrow sinus openings.  Pollutants, such as chemicals or irritants in the air.  A foreign object stuck in the nose.  A fungal infection. This is rare.  What increases the risk? The following factors may make you more likely to develop this condition:  Having allergies or asthma.  Having had a recent cold or  respiratory tract infection.  Having structural deformities or blockages in your nose or sinuses.  Having a weak immune system.  Doing a lot of swimming or diving.  Overusing nasal sprays.  Smoking.  What are the signs or symptoms? The main symptoms of this condition are pain and a feeling of pressure around the affected sinuses. Other symptoms include:  Upper toothache.  Earache.  Headache.  Bad breath.  Decreased sense of smell and taste.  A cough that may get worse at night.  Fatigue.  Fever.  Thick drainage from your nose. The drainage is often green and it may contain pus (purulent).  Stuffy nose or congestion.  Postnasal drip. This is when extra mucus collects in the throat or back of the nose.  Swelling and warmth over the affected sinuses.  Sore throat.  Sensitivity to light.  How is this diagnosed? This condition is diagnosed based on symptoms, a medical history, and a physical exam. To find out if your condition is acute or chronic, your health care provider may:  Look in your nose for signs of nasal polyps.  Tap over the affected sinus to check for signs of infection.  View the inside of your sinuses using an imaging device that has a light attached (endoscope).  If your health care provider suspects that you have chronic sinusitis, you may also:  Be tested for allergies.  Have a sample of mucus taken from your nose (nasal culture) and checked for bacteria.  Have a mucus sample examined to see if your sinusitis is related to  an allergy.  If your sinusitis does not respond to treatment and it lasts longer than 8 weeks, you may have an MRI or CT scan to check your sinuses. These scans also help to determine how severe your infection is. In rare cases, a bone biopsy may be done to rule out more serious types of fungal sinus disease. How is this treated? Treatment for sinusitis depends on the cause and whether your condition is chronic or acute.  If a virus is causing your sinusitis, your symptoms will go away on their own within 10 days. You may be given medicines to relieve your symptoms, including:  Topical nasal decongestants. They shrink swollen nasal passages and let mucus drain from your sinuses.  Antihistamines. These drugs block inflammation that is triggered by allergies. This can help to ease swelling in your nose and sinuses.  Topical nasal corticosteroids. These are nasal sprays that ease inflammation and swelling in your nose and sinuses.  Nasal saline washes. These rinses can help to get rid of thick mucus in your nose.  If your condition is caused by bacteria, you will be given an antibiotic medicine. If your condition is caused by a fungus, you will be given an antifungal medicine. Surgery may be needed to correct underlying conditions, such as narrow nasal passages. Surgery may also be needed to remove polyps. Follow these instructions at home: Medicines  Take, use, or apply over-the-counter and prescription medicines only as told by your health care provider. These may include nasal sprays.  If you were prescribed an antibiotic medicine, take it as told by your health care provider. Do not stop taking the antibiotic even if you start to feel better. Hydrate and Humidify  Drink enough water to keep your urine clear or pale yellow. Staying hydrated will help to thin your mucus.  Use a cool mist humidifier to keep the humidity level in your home above 50%.  Inhale steam for 10-15 minutes, 3-4 times a day or as told by your health care provider. You can do this in the bathroom while a hot shower is running.  Limit your exposure to cool or dry air. Rest  Rest as much as possible.  Sleep with your head raised (elevated).  Make sure to get enough sleep each night. General instructions  Apply a warm, moist washcloth to your face 3-4 times a day or as told by your health care provider. This will help with  discomfort.  Wash your hands often with soap and water to reduce your exposure to viruses and other germs. If soap and water are not available, use hand sanitizer.  Do not smoke. Avoid being around people who are smoking (secondhand smoke).  Keep all follow-up visits as told by your health care provider. This is important. Contact a health care provider if:  You have a fever.  Your symptoms get worse.  Your symptoms do not improve within 10 days. Get help right away if:  You have a severe headache.  You have persistent vomiting.  You have pain or swelling around your face or eyes.  You have vision problems.  You develop confusion.  Your neck is stiff.  You have trouble breathing. This information is not intended to replace advice given to you by your health care provider. Make sure you discuss any questions you have with your health care provider. Document Released: 02/15/2005 Document Revised: 10/12/2015 Document Reviewed: 12/11/2014 Elsevier Interactive Patient Education  [employer].

## 2017-07-26 NOTE — Progress Notes (Signed)
Careteam: Patient Care Team: Gildardo Cranker, DO as PCP - General (Internal Medicine) Druscilla Brownie, MD as Consulting Physician (Dermatology) [name] Irani, MD (Inactive) as Consulting Physician (Gastroenterology) Neldon Mc, MD as Consulting Physician (General Surgery) Lorretta Harp, MD as Consulting Physician (Cardiology) Bjorn Loser, MD as Consulting Physician (Urology) Lavell Anchors, MD as Consulting Physician (Ophthalmology)  Advanced Directive information Does Patient Have a Medical Advance Directive?: No  Allergies  Allergen Reactions  . Lisinopril Cough  . Codeine Rash    All over the body.    Chief Complaint  Patient presents with  . Acute Visit    Pt is being seen due to cough, nasal congestion and sore throat for 3 weeks.      HPI: Patient is a 84 y.o. female seen in the office today due to nasal congestion and fullness in head for 3 weeks.  Allergies have been bad over the last 3 weeks.  Having nasal congestion, head hurting and cough.  Productive cough- yellow and green.  Chest congestion as well.  Getting worse.  Low energy.  No fever or chills.  Lots of pain behind eyes Has been taking allergy pill (loratidine) daily  Has taken a few doses of mucinex dm  Not currently using flonase, used all of it in the past Having wheezing, reports she is out of her albuterol.   Review of Systems:  Review of Systems  Constitutional: Positive for malaise/fatigue. Negative for chills and fever.  HENT: Positive for congestion, sinus pain and sore throat. Negative for ear pain, hearing loss and tinnitus.   Respiratory: Positive for cough, sputum production, shortness of breath and wheezing.   Cardiovascular: Negative for chest pain and palpitations.  Neurological: Positive for headaches (sinus). Negative for dizziness.    Past Medical History:  Diagnosis Date  . Aortic stenosis 07/20/2012   Aortic stenosis   . Aortic valve disorder 08/13/2011     ECHO - EF >32%; mild diastolic dysfunction; calcified aortic valve, not well visualized; mod/severe aortic stenosis w/ worsening gradients when compared to 2012  . Arthritis   . Cancer ([place])    Breast  . Carotid bruit 08/06/2008   Doppler - R ECA demonstrates noarrowing w/ elevated velocities consistent w/ >70% diameter reduction; R and L ICAs show no evidence of diameter reduction, significant tortuosity or vascular abnormality;   . Change in voice   . COPD (chronic obstructive pulmonary disease) ([place])   . Hearing loss   . Heart murmur   . Hyperlipidemia   . Hypertension    dr Gwenlyn Found  . Osteoporosis   . PAF (paroxysmal atrial fibrillation) ([place]) 09/04/2012  . PVD (peripheral vascular disease) ([place]) 08/13/2010   R/P MV - normal pattern of perfusion in all regions, EF 76%; no significant wall abnormalities noted; normal perfusion study  . Right carotid bruit    high-grade right external carotid artery stenosis by 2 parts ultrasound 2 years ago  . S/P aortic valve replacement with bioprosthetic valve 08/30/2012   21 mm [facility] Ease bovine pericardial tissue valve   Past Surgical History:  Procedure Laterality Date  . ABDOMINAL HYSTERECTOMY     Partial  . AORTIC VALVE REPLACEMENT N/A 08/30/2012   Procedure: AORTIC VALVE REPLACEMENT (AVR);  Surgeon: Rexene Alberts, MD;  Location: [facility];  Service: Open Heart Surgery;  Laterality: N/A;  . BIOPSY SHOULDER Left 10/08/2005   shave biopsy  . BREAST LUMPECTOMY  02/25/1997   right  . CARDIAC CATHETERIZATION    .  [place]  . COSMETIC SURGERY Left 1997   Breast implant  . EYE SURGERY  1997   Cataract surgery  . INTRAOPERATIVE TRANSESOPHAGEAL ECHOCARDIOGRAM N/A 08/30/2012   Procedure: INTRAOPERATIVE TRANSESOPHAGEAL ECHOCARDIOGRAM;  Surgeon: Rexene Alberts, MD;  Location: [place];  Service: Open Heart Surgery;  Laterality: N/A;  . LEFT AND RIGHT HEART CATHETERIZATION WITH CORONARY ANGIOGRAM N/A 08/14/2012   Procedure: LEFT AND RIGHT  HEART CATHETERIZATION WITH CORONARY ANGIOGRAM;  Surgeon: Lorretta Harp, MD;  Location: [facility] CATH LAB;  Service: Cardiovascular;  Laterality: N/A;  . LEFT HEART CATH  08/14/12   Nl cors, AS  . MASTECTOMY  09/29/95   left  . PARTIAL HYSTERECTOMY    . TOTAL HIP ARTHROPLASTY  09/2010   Social History:   reports that she has never smoked. She has never used smokeless tobacco. She reports that she does not drink alcohol or use drugs.  Family History  Problem Relation Age of Onset  . Cancer Mother        Bladder  . Early death Father        Tractor accident  . Early death Brother        Radiation protection practitioner  . Early death Brother        during heart surgery    Medications: Patient's Medications  New Prescriptions   No medications on file  Previous Medications   ACETAMINOPHEN (TYLENOL) 325 MG TABLET    Take 2 tablets (650 mg total) by mouth every 6 (six) hours as needed.   ALBUTEROL (PROAIR HFA) 108 (90 BASE) MCG/ACT INHALER    INHALE 1 PUFF INTO THE LUNGS EVERY 6 (SIX) HOURS AS NEEDED FOR WHEEZING OR SHORTNESS OF BREATH.   BIOTIN 5000 MCG CAPS    Take 1 capsule by mouth daily.   BUDESONIDE-FORMOTEROL (SYMBICORT) 160-4.5 MCG/ACT INHALER    Inhale 2 puffs into the lungs 2 (two) times daily.   CALCIUM CITRATE-VITAMIN D (CITRACAL + D PO)    Take 1 tablet by mouth 2 (two) times daily.    [employer] LORATADINE 10 MG TABLET    TAKE 1 TABLET (10 MG TOTAL) BY MOUTH DAILY.   FLUTICASONE (FLONASE) 50 MCG/ACT NASAL SPRAY    USE 1 SPRAY IN EACH NOSTRIL TWICE DAILY   HYDROXYZINE (ATARAX/VISTARIL) 25 MG TABLET    TAKE 1 TABLET BY MOUTH AS NEEDED   LOSARTAN (COZAAR) 25 MG TABLET    TAKE 1 TABLET (25 MG TOTAL) BY MOUTH DAILY. TAKE PLACE OF VALSARTAN   MAGNESIUM HYDROXIDE (PHILLIPS MILK OF MAGNESIA PO)    Take 15 mL by mouth. Take daily at 6 o'clock at night   METOPROLOL TARTRATE (LOPRESSOR) 25 MG TABLET    TAKE 1 TABLET (25 MG TOTAL) BY MOUTH 2 (TWO) TIMES DAILY.   MIRABEGRON ER (MYRBETRIQ) 50 MG TB24 TABLET    Take 50  mg by mouth daily.   OMEPRAZOLE (PRILOSEC) 20 MG CAPSULE    TAKE 2 CAPSULES BY MOUTH DAILY FOR REFLUX   SHARK LIVER OIL-COCOA BUTTER (PREPARATION H) 0.25-3-85.5 % SUPPOSITORY    Place 1 suppository rectally as needed.   SODIUM CHLORIDE (OCEAN) 0.65 % SOLN NASAL SPRAY    Place 1 spray into both nostrils as needed for congestion.   TIOTROPIUM (SPIRIVA) 18 MCG INHALATION CAPSULE    Place 1 capsule (18 mcg total) into inhaler and inhale daily.   VITAMIN E 400 UNIT CAPSULE    Take 400 Units by mouth daily.  Modified Medications  No medications on file  Discontinued Medications   ZOSTER VACCINE ADJUVANTED (SHINGRIX) INJECTION    Inject 0.5 mLs into the muscle once.     Physical Exam:  Vitals:   07/26/17 1341  BP: 134/76  Pulse: 72  Temp: 98.9 F (37.2 C)  TempSrc: Oral  SpO2: 95%  Weight: 167 lb (75.8 kg)  Height: 5\' 3"  (1.6 m)   Body mass index is 29.58 kg/m.  Physical Exam  Constitutional: She is oriented to person, place, and time. She appears well-developed and well-nourished.  HENT:  Head: Normocephalic and atraumatic.  Right Ear: External ear normal.  Left Ear: External ear normal.  Mouth/Throat: Oropharynx is clear and moist. No oropharyngeal exudate.  voice is hoarse  Eyes: Pupils are equal, round, and reactive to light. EOM are normal. No scleral icterus.  Neck: Normal range of motion. Neck supple. No tracheal deviation present. No thyromegaly present.  Cardiovascular: Normal rate and regular rhythm. Exam reveals no gallop and no friction rub.  Murmur (2/6 SEM) heard. Pulmonary/Chest: Effort normal. No stridor. No respiratory distress. She has decreased breath sounds. She has wheezes. She has no rales.  Abdominal: Normal appearance. There is no hepatomegaly. There is no rigidity.  Lymphadenopathy:    She has no cervical adenopathy.  Neurological: She is alert and oriented to person, place, and time. She has normal reflexes.  Skin: Skin is warm and dry.    Labs  reviewed: Basic Metabolic Panel: Recent Labs    09/07/16 1451 09/28/16 1423 05/23/17 0810  NA 136 137 142  K 5.4* 5.2 4.5  CL 95* 101 104  CO2 24 25 31   GLUCOSE 94 95 97  BUN 15 12 15   CREATININE 1.22* 1.01* 1.14*  CALCIUM 10.2 10.0 9.7  TSH  --   --  1.50   Liver Function Tests: Recent Labs    05/23/17 0810  AST 16  ALT 11  BILITOT 0.5  PROT 5.9*   No results for input(s): LIPASE, AMYLASE in the last 8760 hours. No results for input(s): AMMONIA in the last 8760 hours. CBC: Recent Labs    05/23/17 0810  WBC 6.0  NEUTROABS 3,402  HGB 12.1  HCT 34.8*  MCV 93.8  PLT 191   Lipid Panel: Recent Labs    05/23/17 0810  CHOL 146  HDL 52  LDLCALC 79  TRIG 72  CHOLHDL 2.8   TSH: Recent Labs    05/23/17 0810  TSH 1.50   A1C: Lab Results  Component Value Date   HGBA1C 5.5 08/28/2012     Assessment/Plan 1. Acute exacerbation of chronic obstructive pulmonary disease (COPD) ([place]) 2. Acute non-recurrent pansinusitis - albuterol (PROAIR HFA) 108 (90 Base) MCG/ACT inhaler; INHALE 1 PUFF INTO THE LUNGS EVERY 6 (SIX) HOURS AS NEEDED FOR WHEEZING OR SHORTNESS OF BREATH.  Dispense: 8.5 Inhaler; Refill: 2 - predniSONE (STERAPRED UNI-PAK 21 TAB) 10 MG (21) TBPK tablet; Use as directed  Dispense: 21 tablet; Refill: 0 - doxycycline (VIBRA-TABS) 100 MG tablet; Take 1 tablet (100 mg total) by mouth 2 (two) times daily.  Dispense: 20 tablet; Refill: 0 - fluticasone (FLONASE) 50 MCG/ACT nasal spray; Place 1 spray into both nostrils 2 (two) times daily.  Dispense: 16 g; Refill: 1 neti pot twice daily Plain nasal saline spray throughout the day as needed May use tylenol 325 mg 2 tablets every 6 hours as needed aches and pains or sore throat humidifier in the home to help with the dry air Mucinex DM by mouth  twice daily as needed for cough and congestion with full glass of water  Keep well hydrated Avoid forcefully blowing nose Return precautions discussed   Carlos American.  [place], [facility] [phone]

## 2017-07-29 DIAGNOSIS — J385 Laryngeal spasm: Secondary | ICD-10-CM | POA: Diagnosis not present

## 2017-08-05 ENCOUNTER — Encounter: Payer: Self-pay | Admitting: Cardiovascular Disease

## 2017-08-05 ENCOUNTER — Ambulatory Visit (INDEPENDENT_AMBULATORY_CARE_PROVIDER_SITE_OTHER): Payer: Medicare Other | Admitting: Cardiovascular Disease

## 2017-08-05 VITALS — BP 156/64 | HR 57 | Ht 63.0 in | Wt 167.0 lb

## 2017-08-05 DIAGNOSIS — I10 Essential (primary) hypertension: Secondary | ICD-10-CM

## 2017-08-05 DIAGNOSIS — I350 Nonrheumatic aortic (valve) stenosis: Secondary | ICD-10-CM | POA: Diagnosis not present

## 2017-08-05 DIAGNOSIS — Z953 Presence of xenogenic heart valve: Secondary | ICD-10-CM

## 2017-08-05 NOTE — Progress Notes (Signed)
08/05/2017 [name]   [birth year]  [id number]  Primary Physician Gildardo Cranker, DO Primary Cardiologist: Lorretta Harp MD Garret [facility]d[place], [place], [place]  HPI:  [name] is a 84 y.o.  mildly overweight married Caucasian female who I last saw  08/04/2016.  She is accompanied by her daughter [name].  She is a mother of 2, grandmother to 1 grandchild.  Her risk factors include hypertension and family history. A brother died at age 33 from heart-related issues while he was being operated on. She has never had a heart attack or stroke. She does have moderate aortic stenosis by 2D echocardiogram last performed a year ago with a valve area of 0.83 cm2, peak gradient of 57, and mean of 35. She had negative Myoview on August 13, 2010. Since I saw her last, she developed exertional jaw pain, which was fairly reproducible. Her last lipid profile a year ago was excellent with total cholesterol 166, LDL 89, HDL 44. 2-D echo was performed showed critical aortic stenosis the valve area 0.5 cm. Based on thisthe patient underwent right left heart cardiac catheterization by myself revealing normal coronaries and normal left function. She had critical aortic stenosis and ultimately underwent bioprosthetic aortic valve replacement by Dr. Roxy Manns on 08/30/12 with an Edwards magna ease pericardial tissue valve (21 mm) excellent result. Her postop course was complicated by nausea and paroxysmal atrial fibrillation for which she was treated with low dose amiodarone and Coumadin anticoagulation. She currently has had no further episodes of PAF. Her Coumadin and amiodarone discontinued. Her followup 2-D echocardiogram performed in August of last year revealed normal LV function with a well-functioning aortic bioprosthesis. She denies chest pain but continues to have dyspnea on exertion.     Current Meds  Medication Sig  . acetaminophen (TYLENOL) 325 MG tablet Take 2 tablets (650 mg total) by mouth every 6 (six) hours as  needed.  Marland Kitchen albuterol (PROAIR HFA) 108 (90 Base) MCG/ACT inhaler INHALE 1 PUFF INTO THE LUNGS EVERY 6 (SIX) HOURS AS NEEDED FOR WHEEZING OR SHORTNESS OF BREATH.  Marland Kitchen Biotin 5000 MCG CAPS Take 1 capsule by mouth daily.  . budesonide-formoterol (SYMBICORT) 160-4.5 MCG/ACT inhaler Inhale 2 puffs into the lungs 2 (two) times daily.  . Calcium Citrate-Vitamin D (CITRACAL + D PO) Take 1 tablet by mouth 2 (two) times daily.   . [facility]S LORATADINE 10 MG tablet TAKE 1 TABLET (10 MG TOTAL) BY MOUTH DAILY.  Marland Kitchen doxycycline (VIBRA-TABS) 100 MG tablet Take 1 tablet (100 mg total) by mouth 2 (two) times daily.  . fluticasone (FLONASE) 50 MCG/ACT nasal spray Place 1 spray into both nostrils 2 (two) times daily.  . hydrOXYzine (ATARAX/VISTARIL) 25 MG tablet TAKE 1 TABLET BY MOUTH AS NEEDED  . losartan (COZAAR) 25 MG tablet TAKE 1 TABLET (25 MG TOTAL) BY MOUTH DAILY. TAKE PLACE OF VALSARTAN  . Magnesium Hydroxide (PHILLIPS MILK OF MAGNESIA PO) Take 15 mL by mouth. Take daily at 6 o'clock at night  . metoprolol tartrate (LOPRESSOR) 25 MG tablet TAKE 1 TABLET (25 MG TOTAL) BY MOUTH 2 (TWO) TIMES DAILY.  . mirabegron ER (MYRBETRIQ) 50 MG TB24 tablet Take 50 mg by mouth daily.  Marland Kitchen omeprazole (PRILOSEC) 20 MG capsule TAKE 2 CAPSULES BY MOUTH DAILY FOR REFLUX  . predniSONE (STERAPRED UNI-PAK 21 TAB) 10 MG (21) TBPK tablet Use as directed  . shark liver oil-cocoa butter (PREPARATION H) 0.25-3-85.5 % suppository Place 1 suppository rectally as needed.  . sodium chloride (OCEAN)  0.65 % SOLN nasal spray Place 1 spray into both nostrils as needed for congestion.  Marland Kitchen tiotropium (SPIRIVA) 18 MCG inhalation capsule Place 1 capsule (18 mcg total) into inhaler and inhale daily.  . vitamin E 400 UNIT capsule Take 400 Units by mouth daily.     Allergies  Allergen Reactions  . Lisinopril Cough  . Codeine Rash    All over the body.    Social History   Socioeconomic History  . Marital status: Widowed    Spouse name: Not on file  .  Number of children: Not on file  . Years of education: Not on file  . Highest education level: Not on file  Occupational History  . Not on file  Social Needs  . Financial resource strain: Not hard at all  . Food insecurity:    Worry: Never true    Inability: Never true  . Transportation needs:    Medical: No    Non-medical: No  Tobacco Use  . Smoking status: Never Smoker  . Smokeless tobacco: Never Used  Substance and Sexual Activity  . Alcohol use: No  . Drug use: No  . Sexual activity: Never  Lifestyle  . Physical activity:    Days per week: 7 days    Minutes per session: 10 min  . Stress: Only a little  Relationships  . Social connections:    Talks on phone: More than three times a week    Gets together: More than three times a week    Attends religious service: More than 4 times per year    Active member of club or organization: No    Attends meetings of clubs or organizations: Never    Relationship status: Widowed  . Intimate partner violence:    Fear of current or ex partner: No    Emotionally abused: No    Physically abused: No    Forced sexual activity: No  Other Topics Concern  . Not on file  Social History Narrative  . Not on file     Review of Systems: General: negative for chills, fever, night sweats or weight changes.  Cardiovascular: negative for chest pain, dyspnea on exertion, edema, orthopnea, palpitations, paroxysmal nocturnal dyspnea or shortness of breath Dermatological: negative for rash Respiratory: negative for cough or wheezing Urologic: negative for hematuria Abdominal: negative for nausea, vomiting, diarrhea, bright red blood per rectum, melena, or hematemesis Neurologic: negative for visual changes, syncope, or dizziness All other systems reviewed and are otherwise negative except as noted above.    Blood pressure (!) 156/64, pulse (!) 57, height 5\' 3"  (1.6 m), weight 167 lb (75.8 kg).  General appearance: alert and no  distress Neck: no adenopathy, no carotid bruit, no JVD, supple, symmetrical, trachea midline and thyroid not enlarged, symmetric, no tenderness/mass/nodules Lungs: clear to auscultation bilaterally Heart: Soft outflow tract murmur Extremities: extremities normal, atraumatic, no cyanosis or edema Pulses: 2+ and symmetric Skin: Skin color, texture, turgor normal. No rashes or lesions Neurologic: Alert and oriented X 3, normal strength and tone. Normal symmetric reflexes. Normal coordination and gait  EKG sinus bradycardia 57 without ST or T wave changes.  I personally reviewed this EKG.  ASSESSMENT AND PLAN:   HTN (hypertension) History of essential hypertension blood pressure measured today at 156/64.  She is on metoprolol, losartan and hydrochlorothiazide.  Continue current meds at current dosing.  Aortic stenosis- critical AS at cath 08/14/12, with surgery History of critical aortic stenosis status post cardiac catheterization performed by  myself revealing normal coronary arteries with critical left ear 5 cm.  She also underwent aortic valve replacement by Dr. Roxy Manns 08/30/2012 with an Edwards magna ease pericardial valve (21 mm) with an excellent result.  Her symptoms improved.  Her last 2D echo performed 10/05/2016 revealed normal LV systolic function with grade 2 diastolic dysfunction and a normally functioning aortic bioprosthesis.  We will repeat a 2D echo on annual basis.      Lorretta Harp MD FACP,FACC,FAHA, [facility] 08/05/2017 11:46 AM

## 2017-08-05 NOTE — Assessment & Plan Note (Signed)
History of essential hypertension blood pressure measured today at 156/64.  She is on metoprolol, losartan and hydrochlorothiazide.  Continue current meds at current dosing.

## 2017-08-05 NOTE — Patient Instructions (Signed)
Medication Instructions: Your physician recommends that you continue on your current medications as directed. Please refer to the Current Medication list given to you today.  Testing/Procedures: Your physician has requested that you have an echocardiogram in August. Echocardiography is a painless test that uses sound waves to create images of your heart. It provides your doctor with information about the size and shape of your heart and how well your heart's chambers and valves are working. This procedure takes approximately one hour. There are no restrictions for this procedure.  Follow-Up: Your physician wants you to follow-up in: 1 year with Dr. Gwenlyn Found. You will receive a reminder letter in the mail two months in advance. If you don't receive a letter, please call our office to schedule the follow-up appointment.  If you need a refill on your cardiac medications before your next appointment, please call your pharmacy.

## 2017-08-05 NOTE — Assessment & Plan Note (Signed)
History of critical aortic stenosis status post cardiac catheterization performed by myself revealing normal coronary arteries with critical left ear 5 cm.  She also underwent aortic valve replacement by Dr. Roxy Manns 08/30/2012 with an Edwards magna ease pericardial valve (21 mm) with an excellent result.  Her symptoms improved.  Her last 2D echo performed 10/05/2016 revealed normal LV systolic function with grade 2 diastolic dysfunction and a normally functioning aortic bioprosthesis.  We will repeat a 2D echo on annual basis.

## 2017-08-09 ENCOUNTER — Ambulatory Visit (INDEPENDENT_AMBULATORY_CARE_PROVIDER_SITE_OTHER): Payer: Medicare Other | Admitting: Internal Medicine

## 2017-08-09 ENCOUNTER — Ambulatory Visit
Admission: RE | Admit: 2017-08-09 | Discharge: 2017-08-09 | Disposition: A | Discharge status: Home / Self Care | Payer: Medicare Other | Source: Ambulatory Visit | Attending: Internal Medicine | Admitting: Internal Medicine

## 2017-08-09 ENCOUNTER — Encounter: Payer: Self-pay | Admitting: Internal Medicine

## 2017-08-09 VITALS — BP 148/76 | HR 66 | Temp 98.0°F | Ht 63.0 in | Wt 168.0 lb

## 2017-08-09 DIAGNOSIS — J383 Other diseases of vocal cords: Secondary | ICD-10-CM | POA: Diagnosis not present

## 2017-08-09 DIAGNOSIS — Z79899 Other long term (current) drug therapy: Secondary | ICD-10-CM | POA: Diagnosis not present

## 2017-08-09 DIAGNOSIS — Z953 Presence of xenogenic heart valve: Secondary | ICD-10-CM

## 2017-08-09 DIAGNOSIS — J441 Chronic obstructive pulmonary disease with (acute) exacerbation: Secondary | ICD-10-CM

## 2017-08-09 DIAGNOSIS — R059 Cough, unspecified: Secondary | ICD-10-CM

## 2017-08-09 DIAGNOSIS — I480 Paroxysmal atrial fibrillation: Secondary | ICD-10-CM | POA: Diagnosis not present

## 2017-08-09 DIAGNOSIS — R05 Cough: Secondary | ICD-10-CM

## 2017-08-09 DIAGNOSIS — N183 Chronic kidney disease, stage 3 unspecified: Secondary | ICD-10-CM

## 2017-08-09 DIAGNOSIS — I10 Essential (primary) hypertension: Secondary | ICD-10-CM | POA: Diagnosis not present

## 2017-08-09 DIAGNOSIS — J302 Other seasonal allergic rhinitis: Secondary | ICD-10-CM | POA: Diagnosis not present

## 2017-08-09 MED ORDER — PREDNISONE 10 MG PO TABS: ORAL_TABLET | ORAL | 0 refills | Status: DC

## 2017-08-09 MED ORDER — AMOXICILLIN-POT CLAVULANATE 875-125 MG PO TABS: 1.0000 | ORAL_TABLET | Freq: Two times a day (BID) | ORAL | 0 refills | Status: DC

## 2017-08-09 NOTE — Progress Notes (Signed)
Patient ID: [name], female   DOB: May 10, 1931, 84 y.o.   MRN: [id number]   Location:  [facility]  Place of Service:  OFFICE  Provider: Arletha Grippe, DO  Patient Care Team: Gildardo Cranker, DO as PCP - General (Internal Medicine) Druscilla Brownie, MD as Consulting Physician (Dermatology) [name] Irani, MD (Inactive) as Consulting Physician (Gastroenterology) Neldon Mc, MD as Consulting Physician (General Surgery) Lorretta Harp, MD as Consulting Physician (Cardiology) Bjorn Loser, MD as Consulting Physician (Urology) Lavell Anchors, MD as Consulting Physician (Ophthalmology)  Extended Emergency Contact Information Primary Emergency Contact: Johnathan Hausen [place] Phone: [phone] Mobile Phone: [phone] Relation: Daughter  Code Status:  Goals of Care: Advanced Directive information Advanced Directives 07/26/2017  Does Patient Have a Medical Advance Directive? No  Would patient like information on creating a medical advance directive? -  Pre-existing out of facility DNR order (yellow form or pink MOST form) -     Chief Complaint  Patient presents with  . Medical Management of Chronic Issues    Extended visit, EGK completed by Cardiologist. Patient c/o right shoulder pain (had injection 2 months ago). AWW completed 04/2017, - fall, - depression, 28/30 MMSE.   Marland Kitchen URI    Vomiting episodes, cough, runny nose, eye pressure, and chest pain (seen Dr.Berry last Friday)     HPI: Patient is a 84 y.o. female seen in today for an extended visit.  AWV completed 05/23/17. MMSE 28/30.  She was tx for COPD exacerbation and sinusitis 07/26/17 with doxy and prednisone. She continues to have upper respiratory sx's.   She continues to have right shoulder pain. She saw PA Carlyon Shadow with Dr Lucie's office and rec'd injection in shoulder. She has pain with ROM. Xray revealed severe glenohumeral arthritis.  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 She is followed by Dr Joya Gaskins at [facility]. Last injection given 07/29/17.  Allergic rhinitis - stable on flonase/loratidine  COPD - stable on symbicort and spiriva. Uses HFA prn. She has not had PFTs in several yrs. She stopped taking daliresp some time ago  HTN - controlled. Followed by cardio Dr Gwenlyn Found. Current meds consist of valsartan and metoprolol. She has been taking losartan due to recall on valsartan but then no longer on recall. She states cost of losartan < valsartan and she would like to change med   GERD - stable on prilosec 40 mg daily  OAB - stable on myrbetriq and is followed by urology, Dr Vikki Ports. She has stress incontinence especially when she coughs. Cr 1.14  PAF - rate controlled on metoprolol. Followed by cardio Dr Gwenlyn Found. She had a carotid study that showed stable stenosis. 2D echo showed nml EF in 2016 with stable prosthetic valve. Repeat 2D echo in Aug 2017 showed nml LV size and fxn with biatrial enlargement; bioprosthetic valve functioning well. She has a hx AV replacement. She does not take anticoagulation. LDL 79   Depression screen [facility] 2/9 08/09/2017 05/23/2017 02/04/2016 09/24/2015 01/01/2014  Decreased Interest 0 0 0 0 0  Down, Depressed, Hopeless 0 1 0 0 0  PHQ - 2 Score 0 1 0 0 0    Fall Risk  08/09/2017 07/26/2017 05/23/2017 09/08/2016 05/05/2016  Falls in the past year? No No No Yes Yes  Number falls in past yr: - - - 1 1  Injury with Fall? - - - Yes Yes   [place] Exam 05/23/2017 02/04/2016 11/27/2014  Orientation  to time 5 5 5   Orientation to Place 5 5 5   Registration 3 3 3   Attention/ Calculation 5 4 4   Recall 1 2 2   Language- name 2 objects 2 2 2   Language- repeat 1 1 1   Language- follow 3 step command 3 3 3   Language- read & follow direction 1 0 1  Write a sentence 1 0 1  Copy design 1 1 1   Total score 28 26 28      Health Maintenance  Topic Date Due  . INFLUENZA VACCINE  09/29/2017  . TETANUS/TDAP   05/27/2024  . DEXA SCAN  Completed  . PNA vac Low Risk Adult  Completed     Past Medical History:  Diagnosis Date  . Aortic stenosis 07/20/2012   Aortic stenosis   . Aortic valve disorder 08/13/2011   ECHO - EF >55%; mild diastolic dysfunction; calcified aortic valve, not well visualized; mod/severe aortic stenosis w/ worsening gradients when compared to 2012  . Arthritis   . Cancer ([place])    Breast  . Carotid bruit 08/06/2008   Doppler - R ECA demonstrates noarrowing w/ elevated velocities consistent w/ >70% diameter reduction; R and L ICAs show no evidence of diameter reduction, significant tortuosity or vascular abnormality;   . Change in voice   . COPD (chronic obstructive pulmonary disease) ([place])   . Hearing loss   . Heart murmur   . Hyperlipidemia   . Hypertension    dr Gwenlyn Found  . Osteoporosis   . PAF (paroxysmal atrial fibrillation) ([place]) 09/04/2012  . PVD (peripheral vascular disease) ([place]) 08/13/2010   R/P MV - normal pattern of perfusion in all regions, EF 76%; no significant wall abnormalities noted; normal perfusion study  . Right carotid bruit    high-grade right external carotid artery stenosis by 2 parts ultrasound 2 years ago  . S/P aortic valve replacement with bioprosthetic valve 08/30/2012   21 mm [facility] Ease bovine pericardial tissue valve    Past Surgical History:  Procedure Laterality Date  . ABDOMINAL HYSTERECTOMY     Partial  . AORTIC VALVE REPLACEMENT N/A 08/30/2012   Procedure: AORTIC VALVE REPLACEMENT (AVR);  Surgeon: Rexene Alberts, MD;  Location: [place];  Service: Open Heart Surgery;  Laterality: N/A;  . BIOPSY SHOULDER Left 10/08/2005   shave biopsy  . BREAST LUMPECTOMY  02/25/1997   right  . CARDIAC CATHETERIZATION    . [place]  . COSMETIC SURGERY Left 1997   Breast implant  . EYE SURGERY  1997   Cataract surgery  . INTRAOPERATIVE TRANSESOPHAGEAL ECHOCARDIOGRAM N/A 08/30/2012   Procedure: INTRAOPERATIVE TRANSESOPHAGEAL  ECHOCARDIOGRAM;  Surgeon: Rexene Alberts, MD;  Location: [place];  Service: Open Heart Surgery;  Laterality: N/A;  . LEFT AND RIGHT HEART CATHETERIZATION WITH CORONARY ANGIOGRAM N/A 08/14/2012   Procedure: LEFT AND RIGHT HEART CATHETERIZATION WITH CORONARY ANGIOGRAM;  Surgeon: Lorretta Harp, MD;  Location: [facility] CATH LAB;  Service: Cardiovascular;  Laterality: N/A;  . LEFT HEART CATH  08/14/12   Nl cors, AS  . MASTECTOMY  09/29/95   left  . PARTIAL HYSTERECTOMY    . TOTAL HIP ARTHROPLASTY  09/2010    Family History  Problem Relation Age of Onset  . Cancer Mother        Bladder  . Early death Father        Tractor accident  . Early death Brother        Radiation protection practitioner  . Early death Brother  during heart surgery   Family Status  Relation Name Status  . Mother  Deceased at age 37       Cause of Death: Bladder cancer  . Father  Deceased at age 40       Cause of Death: Accidental  . Brother  Deceased at age 22       Cause of Death: Accidental  . Sister  Deceased at age Birth       Cause of Death: Unknown  . Daughter [facility] Alive  . Son [employer]  . Brother JC Deceased at age 18       Cause of Death: Complications of heart sugery    Social History   Socioeconomic History  . Marital status: Widowed    Spouse name: Not on file  . Number of children: Not on file  . Years of education: Not on file  . Highest education level: Not on file  Occupational History  . Not on file  Social Needs  . Financial resource strain: Not hard at all  . Food insecurity:    Worry: Never true    Inability: Never true  . Transportation needs:    Medical: No    Non-medical: No  Tobacco Use  . Smoking status: Never Smoker  . Smokeless tobacco: Never Used  Substance and Sexual Activity  . Alcohol use: No  . Drug use: No  . Sexual activity: Never  Lifestyle  . Physical activity:    Days per week: 7 days    Minutes per session: 10 min  . Stress: Only a little  Relationships   . Social connections:    Talks on phone: More than three times a week    Gets together: More than three times a week    Attends religious service: More than 4 times per year    Active member of club or organization: No    Attends meetings of clubs or organizations: Never    Relationship status: Widowed  . Intimate partner violence:    Fear of current or ex partner: No    Emotionally abused: No    Physically abused: No    Forced sexual activity: No  Other Topics Concern  . Not on file  Social History Narrative  . Not on file    Allergies  Allergen Reactions  . Lisinopril Cough  . Codeine Rash    All over the body.    Allergies as of 08/09/2017      Reactions   Lisinopril Cough   Codeine Rash   All over the body.      Medication List        Accurate as of 08/09/17  2:56 PM. Always use your most recent med list.          acetaminophen 325 MG tablet Commonly known as:  TYLENOL Take 2 tablets (650 mg total) by mouth every 6 (six) hours as needed.   albuterol 108 (90 Base) MCG/ACT inhaler Commonly known as:  PROAIR HFA INHALE 1 PUFF INTO THE LUNGS EVERY 6 (SIX) HOURS AS NEEDED FOR WHEEZING OR SHORTNESS OF BREATH.   Biotin 5000 MCG Caps Take 1 capsule by mouth daily.   budesonide-formoterol 160-4.5 MCG/ACT inhaler Commonly known as:  SYMBICORT Inhale 2 puffs into the lungs 2 (two) times daily.   CITRACAL + D PO Take 1 tablet by mouth 2 (two) times daily.   [facility] LORATADINE 10 MG tablet Generic drug:  loratadine TAKE 1 TABLET (10  MG TOTAL) BY MOUTH DAILY.   fluticasone 50 MCG/ACT nasal spray Commonly known as:  FLONASE Place 1 spray into both nostrils 2 (two) times daily.   hydrOXYzine 25 MG tablet Commonly known as:  ATARAX/VISTARIL TAKE 1 TABLET BY MOUTH AS NEEDED   losartan 25 MG tablet Commonly known as:  COZAAR TAKE 1 TABLET (25 MG TOTAL) BY MOUTH DAILY. TAKE PLACE OF VALSARTAN   metoprolol tartrate 25 MG tablet Commonly known as:  LOPRESSOR TAKE  1 TABLET (25 MG TOTAL) BY MOUTH 2 (TWO) TIMES DAILY.   MYRBETRIQ 50 MG Tb24 tablet Generic drug:  mirabegron ER Take 50 mg by mouth daily.   omeprazole 20 MG capsule Commonly known as:  PRILOSEC TAKE 2 CAPSULES BY MOUTH DAILY FOR REFLUX   PHILLIPS MILK OF MAGNESIA PO Take 15 mL by mouth. Take daily at 6 o'clock at night   shark liver oil-cocoa butter 0.25-3-85.5 % suppository Commonly known as:  PREPARATION H Place 1 suppository rectally as needed.   sodium chloride 0.65 % Soln nasal spray Commonly known as:  OCEAN Place 1 spray into both nostrils as needed for congestion.   tiotropium 18 MCG inhalation capsule Commonly known as:  SPIRIVA Place 1 capsule (18 mcg total) into inhaler and inhale daily.   vitamin E 400 UNIT capsule Take 400 Units by mouth daily.        Review of Systems:  Review of Systems  HENT: Positive for congestion and voice change.   Respiratory: Positive for cough, shortness of breath and wheezing.   Musculoskeletal: Positive for arthralgias.  All other systems reviewed and are negative.   Physical Exam: Vitals:   08/09/17 1446  BP: (!) 148/76  Pulse: 66  Temp: 98 F (36.7 C)  TempSrc: Oral  SpO2: 97%  Weight: 168 lb (76.2 kg)  Height: 5\' 3"  (1.6 m)   Body mass index is 29.76 kg/m. Physical Exam  Constitutional: She is oriented to person, place, and time. She appears well-developed and well-nourished. No distress.  Voice is hoarse  HENT:  Head: Normocephalic and atraumatic.  Right Ear: Hearing, tympanic membrane, external ear and ear canal normal.  Left Ear: Hearing, tympanic membrane, external ear and ear canal normal.  Mouth/Throat: Uvula is midline, oropharynx is clear and moist and mucous membranes are normal. She does not have dentures.  Eyes: Pupils are equal, round, and reactive to light. Conjunctivae, EOM and lids are normal. No scleral icterus.  Neck: Trachea normal and normal range of motion. Neck supple. Carotid bruit is  not present. No thyroid mass and no thyromegaly present.  Cardiovascular: Normal rate and intact distal pulses. An irregularly irregular rhythm present. Exam reveals no gallop and no friction rub.  Murmur heard.  Systolic murmur is present with a grade of 1/6. Trace LE edema b/l. No calf TTP  Pulmonary/Chest: Effort normal. No stridor. She has wheezes (end expiratory with prolonged expiratory phase). She has no rhonchi. She has no rales. She exhibits no mass. Right breast exhibits no inverted nipple, no mass, no nipple discharge, no skin change and no tenderness. Left breast exhibits no inverted nipple, no mass, no nipple discharge, no skin change and no tenderness. Breasts are asymmetrical (left mastectomy with breast implant noted).  Abdominal: Soft. Normal appearance, normal aorta and bowel sounds are normal. She exhibits no pulsatile midline mass and no mass. There is no hepatosplenomegaly. There is no tenderness. There is no rigidity, no rebound and no guarding. No hernia.  Genitourinary:  Genitourinary Comments: Deferred  due to age  Musculoskeletal: She exhibits no deformity.       Right shoulder: She exhibits decreased range of motion, tenderness, swelling and crepitus.       Arms: Lymphadenopathy:       Head (right side): No posterior auricular adenopathy present.       Head (left side): No posterior auricular adenopathy present.    She has no cervical adenopathy.       Right: No supraclavicular adenopathy present.       Left: No supraclavicular adenopathy present.  Neurological: She is alert and oriented to person, place, and time. She has normal strength and normal reflexes. No cranial nerve deficit. Gait normal.  Skin: Skin is warm, dry and intact. No rash noted. Nails show no clubbing.  Psychiatric: She has a normal mood and affect. Her speech is normal and behavior is normal. Thought content normal. Cognition and memory are normal.    Labs reviewed:  Basic Metabolic  Panel: Recent Labs    09/07/16 1451 09/28/16 1423 05/23/17 0810  NA 136 137 142  K 5.4* 5.2 4.5  CL 95* 101 104  CO2 24 25 31   GLUCOSE 94 95 97  BUN 15 12 15   CREATININE 1.22* 1.01* 1.14*  CALCIUM 10.2 10.0 9.7  TSH  --   --  1.50   Liver Function Tests: Recent Labs    05/23/17 0810  AST 16  ALT 11  BILITOT 0.5  PROT 5.9*   No results for input(s): LIPASE, AMYLASE in the last 8760 hours. No results for input(s): AMMONIA in the last 8760 hours. CBC: Recent Labs    05/23/17 0810  WBC 6.0  NEUTROABS 3,402  HGB 12.1  HCT 34.8*  MCV 93.8  PLT 191   Lipid Panel: Recent Labs    05/23/17 0810  CHOL 146  HDL 52  LDLCALC 79  TRIG 72  CHOLHDL 2.8   Lab Results  Component Value Date   HGBA1C 5.5 08/28/2012    Procedures: No results found. ECG OBTAINED AND REVIEWED BY MYSELF: NSR @60  bpm, LAD, LAE; poor R wave progression. no acute ischemic changes. no change since 10/2016.   Assessment/Plan   ICD-10-CM   1. COPD exacerbation (HCC) J44.1 DG Chest 2 View    amoxicillin-clavulanate (AUGMENTIN) 875-125 MG tablet    predniSONE (DELTASONE) 10 MG tablet   failing to change as expected  2. PAF (paroxysmal atrial fibrillation) (HCC) I48.0   3. S/P aortic valve replacement with bioprosthetic valve Z95.3   4. Essential hypertension I10   5. High risk medication use Z79.899   6. CKD (chronic kidney disease) stage 3, GFR 30-59 ml/min (HCC) N18.3   7. 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 Other seasonal allergic rhinitis J30.2   9. Cough R05 DG Chest 2 View     START AUGMENTIN 875MG  2 TIMES DAILY FOR 7 DAYS  START PREDNISONE TAPER AS DIRECTED - DO NOT USE SYMBICORT WHILE TAKING PREDNISONE. RESUME SYMBICORT ONCE PREDNISONE COMPLETED  TAKE PROBIOTIC (CULTURELLE, ACTIVIA, FLORASTER)  DAILY WHILE ON ANTIBIOTIC  Use saline nasal spray or netti pot to keep nose moist  Will call with xray results  Continue other medications as ordered  Follow up in 4 mos for COPD, HTN,  PAF, OAB  Keeping You Healthy handout given   Cordella Register. Perlie Gold  [facility] [address] [place], [place] [address] [phone] Cell (Monday-Friday 8 AM - 5 PM) [phone]  After 5 PM and follow prompts

## 2017-08-09 NOTE — Patient Instructions (Addendum)
START AUGMENTIN 875MG  2 TIMES DAILY FOR 7 DAYS  START PREDNISONE TAPER AS DIRECTED - DO NOT USE SYMBICORT WHILE TAKING PREDNISONE. RESUME SYMBICORT ONCE PREDNISONE COMPLETED  TAKE PROBIOTIC (CULTURELLE, ACTIVIA, FLORASTER)  DAILY WHILE ON ANTIBIOTIC  Use saline nasal spray or netti pot to keep nose moist  Will call with xray results  Continue other medications as ordered  Follow up in 4 mos for COPD, HTN, PAF, OAB  Keeping You Healthy  Get These Tests  Blood Pressure- Have your blood pressure checked by your healthcare provider at least once a year.  Normal blood pressure is 120/80.  Weight- Have your body mass index (BMI) calculated to screen for obesity.  BMI is a measure of body fat based on height and weight.  You can calculate your own BMI at [employer].it  Cholesterol- Have your cholesterol checked every year.  Diabetes- Have your blood sugar checked every year if you have high blood pressure, high cholesterol, a family history of diabetes or if you are overweight.  Pap Test - Have a pap test every 1 to 5 years if you have been sexually active.  If you are older than 65 and recent pap tests have been normal you may not need additional pap tests.  In addition, if you have had a hysterectomy  for benign disease additional pap tests are not necessary.  Mammogram-Yearly mammograms are essential for early detection of breast cancer  Screening for Colon Cancer- Colonoscopy starting at age 72. Screening may begin sooner depending on your family history and other health conditions.  Follow up colonoscopy as directed by your Gastroenterologist.  Screening for Osteoporosis- Screening begins at age 53 with bone density scanning, sooner if you are at higher risk for developing Osteoporosis.  Get these medicines  Calcium with Vitamin D- Your body requires 1200-1500 mg of Calcium a day and [phone] IU of Vitamin D a day.  You can only absorb 500 mg of Calcium at a time  therefore Calcium must be taken in 2 or 3 separate doses throughout the day.  Hormones- Hormone therapy has been associated with increased risk for certain cancers and heart disease.  Talk to your healthcare provider about if you need relief from menopausal symptoms.  Aspirin- Ask your healthcare provider about taking Aspirin to prevent Heart Disease and Stroke.  Get these Immuniztions  Flu shot- Every fall  Pneumonia shot- Once after the age of 73; if you are younger ask your healthcare provider if you need a pneumonia shot.  Tetanus- Every ten years.  Zostavax- Once after the age of 75 to prevent shingles.  Take these steps  Don't smoke- Your healthcare provider can help you quit. For tips on how to quit, ask your healthcare provider or go to www.smokefree.gov or call 1-800 QUIT-NOW.  Be physically active- Exercise 5 days a week for a minimum of 30 minutes.  If you are not already physically active, start slow and gradually work up to 30 minutes of moderate physical activity.  Try walking, dancing, bike riding, swimming, etc.  Eat a healthy diet- Eat a variety of healthy foods such as fruits, vegetables, whole grains, low fat milk, low fat cheeses, yogurt, lean meats, chicken, fish, eggs, dried beans, tofu, etc.  For more information go to www.thenutritionsource.org  Dental visit- Brush and floss teeth twice daily; visit your dentist twice a year.  Eye exam- Visit your Optometrist or Ophthalmologist yearly.  Drink alcohol in moderation- Limit alcohol intake to one drink or less a  day.  Never drink and drive.  Depression- Your emotional health is as important as your physical health.  If you're feeling down or losing interest in things you normally enjoy, please talk to your healthcare provider.  Seat Belts- can save your life; always wear one  Smoke/Carbon Monoxide detectors- These detectors need to be installed on the appropriate level of your home.  Replace batteries at least  once a year.  Violence- If anyone is threatening or hurting you, please tell your healthcare provider.  Living Will/ Health care power of attorney- Discuss with your healthcare provider and family.

## 2017-08-10 ENCOUNTER — Encounter: Payer: Self-pay | Admitting: Internal Medicine

## 2017-08-24 DIAGNOSIS — Z9849 Cataract extraction status, unspecified eye: Secondary | ICD-10-CM | POA: Diagnosis not present

## 2017-08-24 DIAGNOSIS — H43813 Vitreous degeneration, bilateral: Secondary | ICD-10-CM | POA: Diagnosis not present

## 2017-08-24 DIAGNOSIS — Z961 Presence of intraocular lens: Secondary | ICD-10-CM | POA: Diagnosis not present

## 2017-09-08 ENCOUNTER — Ambulatory Visit (HOSPITAL_COMMUNITY)
Admission: EM | Admit: 2017-09-08 | Discharge: 2017-09-08 | Disposition: A | Discharge status: Home / Self Care | Payer: Medicare Other | Attending: Family Medicine | Admitting: Family Medicine

## 2017-09-08 ENCOUNTER — Encounter (HOSPITAL_COMMUNITY): Payer: Self-pay | Admitting: Emergency Medicine

## 2017-09-08 ENCOUNTER — Telehealth: Payer: Self-pay | Admitting: *Deleted

## 2017-09-08 DIAGNOSIS — R600 Localized edema: Secondary | ICD-10-CM | POA: Diagnosis not present

## 2017-09-08 DIAGNOSIS — R2243 Localized swelling, mass and lump, lower limb, bilateral: Secondary | ICD-10-CM

## 2017-09-08 DIAGNOSIS — R609 Edema, unspecified: Secondary | ICD-10-CM | POA: Diagnosis not present

## 2017-09-08 LAB — POCT I-STAT, CHEM 8
BUN: 9 mg/dL (ref 8–23)
Calcium, Ion: 1.23 mmol/L (ref 1.15–1.40)
Chloride: 101 mmol/L (ref 98–111)
Creatinine, Ser: 1.2 mg/dL — ABNORMAL HIGH (ref 0.44–1.00)
Glucose, Bld: 93 mg/dL (ref 70–99)
HCT: 35 % — ABNORMAL LOW (ref 36.0–46.0)
Hemoglobin: 11.9 g/dL — ABNORMAL LOW (ref 12.0–15.0)
Potassium: 4.9 mmol/L (ref 3.5–5.1)
Sodium: 138 mmol/L (ref 135–145)
TCO2: 26 mmol/L (ref 22–32)

## 2017-09-08 MED ORDER — FUROSEMIDE 20 MG PO TABS: 20.0000 mg | ORAL_TABLET | Freq: Every day | ORAL | 0 refills | Status: DC

## 2017-09-08 NOTE — Discharge Instructions (Signed)
Use of compression stockings while up and active. Drink plenty of water to maintain adequate hydration, this can help with swelling. Elevate legs when at rest, prop with pillows at night.  Please follow up with your primary care provider for recheck in 1 week. If develop increased pain, redness, cough, shortness of breath , chest pain , or otherwise worsening please return or go to Er.

## 2017-09-08 NOTE — ED Triage Notes (Signed)
Pt c/o bilateral leg swelling x1 week.

## 2017-09-08 NOTE — Telephone Encounter (Signed)
Patient called and stated that her foot was swollen and painful. No warmth to the touch and no trauma. Stated that it has been like this for 1 week. Patient is wanting an appointment. None available. Advised patient to go to Urgent Care to have evaluated. Patient agreed.

## 2017-09-08 NOTE — ED Provider Notes (Signed)
[place]    CSN: [id number] Arrival date & time: 09/08/17  1526     History   Chief Complaint Chief Complaint  Patient presents with  . Leg Swelling    HPI [name] is a 84 y.o. female.   Abagayle presents with family with complaints of bilateral ankle and lower extremity swelling which has been ongoing for the past week. Tight sensation but no pain. No injury. States had a very brief episode of swelling once in the past but otherwise does not have regular or intermittent edema. Denies any new cough or shortness of breath . Somewhat tender generally. States tried propping them on on her recliner which minimally has helped. She feels that it has somewhat improved but family disagrees. Hx oc afib, ckd, htn, aortic stenosis, copd, aortic valve replacement.    ROS per HPI.      Past Medical History:  Diagnosis Date  . Aortic stenosis 07/20/2012   Aortic stenosis   . Aortic valve disorder 08/13/2011   ECHO - EF >67%; mild diastolic dysfunction; calcified aortic valve, not well visualized; mod/severe aortic stenosis w/ worsening gradients when compared to 2012  . Arthritis   . Cancer ([place])    Breast  . Carotid bruit 08/06/2008   Doppler - R ECA demonstrates noarrowing w/ elevated velocities consistent w/ >70% diameter reduction; R and L ICAs show no evidence of diameter reduction, significant tortuosity or vascular abnormality;   . Change in voice   . COPD (chronic obstructive pulmonary disease) ([place])   . Hearing loss   . Heart murmur   . Hyperlipidemia   . Hypertension    dr Gwenlyn Found  . Osteoporosis   . PAF (paroxysmal atrial fibrillation) ([place]) 09/04/2012  . PVD (peripheral vascular disease) ([place]) 08/13/2010   R/P MV - normal pattern of perfusion in all regions, EF 76%; no significant wall abnormalities noted; normal perfusion study  . Right carotid bruit    high-grade right external carotid artery stenosis by 2 parts ultrasound 2 years ago  . S/P aortic valve  replacement with bioprosthetic valve 08/30/2012   21 mm [facility] Ease bovine pericardial tissue valve    Patient Active Problem List   Diagnosis Date Noted  . CKD (chronic kidney disease) stage 3, GFR 30-59 ml/min (HCC) 09/08/2016  . Carotid stenosis 05/05/2016  . Injury of left ankle 03/11/2016  . Elbow pain, right 10/10/2013  . Cough 09/12/2013  . COPD exacerbation ([place]) 05/16/2013  . Acute bronchitis 04/11/2013  . 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  . Long term (current) use of anticoagulants 10/02/2012  . Overactive bladder 09/11/2012  . Acute blood loss anemia 09/11/2012  . Weakness generalized 09/11/2012  . PAF (paroxysmal atrial fibrillation) ([place]) 09/04/2012  . S/P aortic valve replacement with bioprosthetic valve 08/30/2012  . Aortic valve stenosis, critical 08/28/2012  . Normal coronary arteries-6/14 08/15/2012  . Dyspnea-exertional 08/15/2012  . Chest pain-exertional 08/15/2012  . COPD (chronic obstructive pulmonary disease)- noted on CXR 08/08/12 08/15/2012  . Aortic stenosis- critical AS at cath 08/14/12, with surgery 07/20/2012  . History of total hip arthroplasty 06/29/2012  . Unspecified constipation 06/06/2012  . Insomnia 06/06/2012  . HTN (hypertension) 06/06/2012  . Osteoporosis 06/06/2012  . GERD (gastroesophageal reflux disease) 06/06/2012  . Laryngeal spasm 12/25/2010  . Hx Left Breast cancer, DCIS 09/29/1995    Past Surgical History:  Procedure Laterality Date  . ABDOMINAL HYSTERECTOMY     Partial  . AORTIC VALVE REPLACEMENT N/A 08/30/2012   Procedure: AORTIC  VALVE REPLACEMENT (AVR);  Surgeon: Rexene Alberts, MD;  Location: [facility];  Service: Open Heart Surgery;  Laterality: N/A;  . BIOPSY SHOULDER Left 10/08/2005   shave biopsy  . BREAST LUMPECTOMY  02/25/1997   right  . CARDIAC CATHETERIZATION    . [place]  . COSMETIC SURGERY Left 1997   Breast implant  . EYE SURGERY  1997   Cataract surgery  . INTRAOPERATIVE TRANSESOPHAGEAL  ECHOCARDIOGRAM N/A 08/30/2012   Procedure: INTRAOPERATIVE TRANSESOPHAGEAL ECHOCARDIOGRAM;  Surgeon: Rexene Alberts, MD;  Location: [facility];  Service: Open Heart Surgery;  Laterality: N/A;  . LEFT AND RIGHT HEART CATHETERIZATION WITH CORONARY ANGIOGRAM N/A 08/14/2012   Procedure: LEFT AND RIGHT HEART CATHETERIZATION WITH CORONARY ANGIOGRAM;  Surgeon: Lorretta Harp, MD;  Location[facility] CATH LAB;  Service: Cardiovascular;  Laterality: N/A;  . LEFT HEART CATH  08/14/12   Nl cors, AS  . MASTECTOMY  09/29/95   left  . PARTIAL HYSTERECTOMY    . TOTAL HIP ARTHROPLASTY  09/2010    OB History   None      Home Medications    Prior to Admission medications   Medication Sig Start Date End Date Taking? Authorizing Provider  acetaminophen (TYLENOL) 325 MG tablet Take 2 tablets (650 mg total) by mouth every 6 (six) hours as needed. 09/08/12   Gold, Wayne E, PA-C  albuterol (PROAIR HFA) 108 (90 Base) MCG/ACT inhaler INHALE 1 PUFF INTO THE LUNGS EVERY 6 (SIX) HOURS AS NEEDED FOR WHEEZING OR SHORTNESS OF BREATH. 07/26/17   Lauree Chandler, NP  amoxicillin-clavulanate (AUGMENTIN) 875-125 MG tablet Take 1 tablet by mouth 2 (two) times daily. 08/09/17   Gildardo Cranker, DO  Biotin 5000 MCG CAPS Take 1 capsule by mouth daily.    [provider]  budesonide-formoterol (SYMBICORT) 160-4.5 MCG/ACT inhaler Inhale 2 puffs into the lungs 2 (two) times daily. 12/11/13   [name], MD  Calcium Citrate-Vitamin D (CITRACAL + D PO) Take 1 tablet by mouth 2 (two) times daily.     [provider]  [facility] LORATADINE 10 MG tablet TAKE 1 TABLET (10 MG TOTAL) BY MOUTH DAILY. 07/23/15   Estill Dooms, MD  fluticasone (FLONASE) 50 MCG/ACT nasal spray Place 1 spray into both nostrils 2 (two) times daily. 07/26/17   Lauree Chandler, NP  furosemide (LASIX) 20 MG tablet Take 1 tablet (20 mg total) by mouth daily for 3 days. 09/08/17 09/11/17  Zigmund Gottron, NP  hydrOXYzine (ATARAX/VISTARIL) 25 MG tablet TAKE 1  TABLET BY MOUTH AS NEEDED 06/03/15   Gildardo Cranker, DO  losartan (COZAAR) 25 MG tablet TAKE 1 TABLET (25 MG TOTAL) BY MOUTH DAILY. TAKE PLACE OF VALSARTAN 07/13/17   Lorretta Harp, MD  Magnesium Hydroxide (PHILLIPS MILK OF MAGNESIA PO) Take 15 mL by mouth. Take daily at 6 o'clock at night    [provider]  metoprolol tartrate (LOPRESSOR) 25 MG tablet TAKE 1 TABLET (25 MG TOTAL) BY MOUTH 2 (TWO) TIMES DAILY. 07/13/17   Lorretta Harp, MD  mirabegron ER (MYRBETRIQ) 50 MG TB24 tablet Take 50 mg by mouth daily.    [provider]  omeprazole (PRILOSEC) 20 MG capsule TAKE 2 CAPSULES BY MOUTH DAILY FOR REFLUX 10/05/16   Gildardo Cranker, DO  predniSONE (DELTASONE) 10 MG tablet Take 4 tabs po daily x 3 days then 3 tabs x 3 days then 2 tabs x 3 days then 1 tab x 3 days for lungs 08/09/17  Gildardo Cranker, DO  shark liver oil-cocoa butter (PREPARATION H) 0.25-3-85.5 % suppository Place 1 suppository rectally as needed.    [provider]  sodium chloride (OCEAN) 0.65 % SOLN nasal spray Place 1 spray into both nostrils as needed for congestion. 05/30/13   [name], MD  tiotropium (SPIRIVA) 18 MCG inhalation capsule Place 1 capsule (18 mcg total) into inhaler and inhale daily. 12/11/13   [name], MD  vitamin E 400 UNIT capsule Take 400 Units by mouth daily.    [provider]    Family History Family History  Problem Relation Age of Onset  . Cancer Mother        Bladder  . Early death Father        Tractor accident  . Early death Brother        Radiation protection practitioner  . Early death Brother        during heart surgery    Social History Social History   Tobacco Use  . Smoking status: Never Smoker  . Smokeless tobacco: Never Used  Substance Use Topics  . Alcohol use: No  . Drug use: No     Allergies   Lisinopril and Codeine   Review of Systems Review of Systems   Physical Exam Triage Vital Signs ED Triage Vitals  Enc Vitals Group     BP 09/08/17  1548 (!) 183/106     Pulse Rate 09/08/17 1548 99     Resp 09/08/17 1548 16     Temp 09/08/17 1548 98.2 F (36.8 C)     Temp src --      SpO2 09/08/17 1548 93 %     Weight --      Height --      Head Circumference --      Peak Flow --      Pain Score 09/08/17 1549 0     Pain Loc --      Pain Edu? --      Excl. in [place]? --    No data found.  Updated Vital Signs BP (!) 183/106   Pulse 99   Temp 98.2 F (36.8 C)   Resp 16   SpO2 93%    Physical Exam  Constitutional: She is oriented to person, place, and time. She appears well-developed and well-nourished. No distress.  Cardiovascular: Normal rate and normal pulses. An irregularly irregular rhythm present.  Murmur heard. Pulses:      Dorsalis pedis pulses are 2+ on the right side, and 2+ on the left side.  Bilateral feet and with +2 pitting edema which extends to ankles and distal lower legs; mildly tender on deep palpation   Pulmonary/Chest: Effort normal and breath sounds normal. No respiratory distress.  Neurological: She is alert and oriented to person, place, and time.  Skin: Skin is warm and dry.     UC Treatments / Results  Labs (all labs ordered are listed, but only abnormal results are displayed) Labs Reviewed  POCT I-STAT, CHEM 8 - Abnormal; Notable for the following components:      Result Value   Creatinine, Ser 1.20 (*)    Hemoglobin 11.9 (*)    HCT 35.0 (*)    All other components within normal limits    EKG None  Radiology No results found.  Procedures Procedures (including critical care time)  Medications Ordered in UC Medications - No data to display  Initial Impression / Assessment and Plan / UC Course  I have reviewed  the triage vital signs and the nursing notes.  Pertinent labs & imaging results that were available during my care of the patient were reviewed by me and considered in my medical decision making (see chart for details).     htn noted. Has been taking her medications. No  increased work of breathing, cough or shortness of breath .  Peripheral edema present, electrolytes WNL, creatinine remains near baseline, last of 1.14 04/2017. Three days of lasix initiated. Ace wraps applied, encouraged use of compression stockings. Elevation. Follow up with PCP in the next week. Return precautions provided. Patient verbalized understanding and agreeable to plan.  Ambulatory out of clinic without difficulty.    Final Clinical Impressions(s) / UC Diagnoses   Final diagnoses:  Peripheral edema     Discharge Instructions     Use of compression stockings while up and active. Drink plenty of water to maintain adequate hydration, this can help with swelling. Elevate legs when at rest, prop with pillows at night.  Please follow up with your primary care provider for recheck in 1 week. If develop increased pain, redness, cough, shortness of breath , chest pain , or otherwise worsening please return or go to Er.    ED Prescriptions    Medication Sig Dispense Auth. Provider   furosemide (LASIX) 20 MG tablet Take 1 tablet (20 mg total) by mouth daily for 3 days. 3 tablet Zigmund Gottron, NP     Controlled Substance Prescriptions [place] Controlled Substance Registry consulted? Not Applicable   Zigmund Gottron, NP 09/08/17 1650

## 2017-09-13 DIAGNOSIS — G8929 Other chronic pain: Secondary | ICD-10-CM | POA: Diagnosis not present

## 2017-09-13 DIAGNOSIS — M19011 Primary osteoarthritis, right shoulder: Secondary | ICD-10-CM | POA: Diagnosis not present

## 2017-09-16 ENCOUNTER — Telehealth: Payer: Self-pay

## 2017-09-16 ENCOUNTER — Ambulatory Visit
Admission: RE | Admit: 2017-09-16 | Discharge: 2017-09-16 | Disposition: A | Discharge status: Home / Self Care | Payer: Medicare Other | Source: Ambulatory Visit | Attending: Internal Medicine | Admitting: Internal Medicine

## 2017-09-16 ENCOUNTER — Other Ambulatory Visit: Payer: Self-pay | Admitting: Internal Medicine

## 2017-09-16 ENCOUNTER — Ambulatory Visit (INDEPENDENT_AMBULATORY_CARE_PROVIDER_SITE_OTHER): Payer: Medicare Other | Admitting: Internal Medicine

## 2017-09-16 ENCOUNTER — Encounter: Payer: Self-pay | Admitting: Internal Medicine

## 2017-09-16 ENCOUNTER — Ambulatory Visit (HOSPITAL_COMMUNITY)
Admission: RE | Admit: 2017-09-16 | Discharge: 2017-09-16 | Disposition: A | Discharge status: Home / Self Care | Payer: Medicare Other | Source: Ambulatory Visit | Attending: Internal Medicine | Admitting: Internal Medicine

## 2017-09-16 VITALS — BP 122/76 | HR 69 | Temp 98.0°F | Resp 10 | Ht 63.0 in | Wt 174.0 lb

## 2017-09-16 DIAGNOSIS — J42 Unspecified chronic bronchitis: Secondary | ICD-10-CM

## 2017-09-16 DIAGNOSIS — I480 Paroxysmal atrial fibrillation: Secondary | ICD-10-CM

## 2017-09-16 DIAGNOSIS — Z953 Presence of xenogenic heart valve: Secondary | ICD-10-CM

## 2017-09-16 DIAGNOSIS — R600 Localized edema: Secondary | ICD-10-CM

## 2017-09-16 DIAGNOSIS — R0602 Shortness of breath: Secondary | ICD-10-CM

## 2017-09-16 DIAGNOSIS — R0609 Other forms of dyspnea: Secondary | ICD-10-CM

## 2017-09-16 DIAGNOSIS — R0600 Dyspnea, unspecified: Secondary | ICD-10-CM

## 2017-09-16 DIAGNOSIS — N183 Chronic kidney disease, stage 3 unspecified: Secondary | ICD-10-CM

## 2017-09-16 DIAGNOSIS — I10 Essential (primary) hypertension: Secondary | ICD-10-CM

## 2017-09-16 DIAGNOSIS — Z79899 Other long term (current) drug therapy: Secondary | ICD-10-CM | POA: Diagnosis not present

## 2017-09-16 DIAGNOSIS — I2699 Other pulmonary embolism without acute cor pulmonale: Secondary | ICD-10-CM | POA: Diagnosis not present

## 2017-09-16 MED ORDER — FUROSEMIDE 20 MG PO TABS: 20.0000 mg | ORAL_TABLET | Freq: Every day | ORAL | 1 refills | Status: DC

## 2017-09-16 MED ORDER — IOPAMIDOL (ISOVUE-370) INJECTION 76%
60.0000 mL | Freq: Once | INTRAVENOUS | Status: AC | PRN
  Administered 2017-09-16: 60 mL via INTRAVENOUS

## 2017-09-16 NOTE — Progress Notes (Addendum)
Preliminary results by tech - Bilateral lower venous duplex completed. Results given to [facility]. Negative for deep and superficial vein thrombosis. Oda Cogan, BS, RDMS, RVT

## 2017-09-16 NOTE — Telephone Encounter (Signed)
I spoke with patient and she verbalized understanding that we are waiting on response from cardiology.   I attempted to call patient back to remind her to wear compression socks but the line was busy. Will try again later.

## 2017-09-16 NOTE — Telephone Encounter (Signed)
A call was received from vein and vascular stating that patient is negative for DVT. Patient was sent home to await any instruction from provider.

## 2017-09-16 NOTE — Telephone Encounter (Signed)
Tell pt we will contact cardiology to determine if she is a candidate for anticoagulation with eliquis, xeralto or coumadin

## 2017-09-16 NOTE — Progress Notes (Signed)
Patient ID: [name], female   DOB: [birth year], 84 y.o.   MRN: [id number]   [facility] OFFICE  Provider: DR Arletha Grippe  Code Status:  Goals of Care:  Advanced Directives 07/26/2017  Does Patient Have a Medical Advance Directive? No  Would patient like information on creating a medical advance directive? -  Pre-existing out of facility DNR order (yellow form or pink MOST form) -     Chief Complaint  Patient presents with  . Acute Visit    Extremity swelling, patient c/o bilateral leg swelling     HPI: Patient is a 84 y.o. female seen today for an acute visit for 2 week hx LE swelling. This is a new c/o. She has associated increased DOE. No palpitations, weakness. No CP. Chart review reveals 6 lb weight gain since last month in the office. She was seen in the ED last week for swelling and Rx lasix x 3 days which she has completed. Swelling a little better. She tried wearing support hose but has not seen a difference. She did not obtain compression stockings. She was seen by cardio last month and no changes made in medical tx. ECG showed no acute ischemic changes. She has a hx PAF (not on anticoagulation), s/p AVR replacement with bioprosthetic valve, COPD. She has CKD - Cr in ED 1.2.  She continues to have right shoulder pain. She saw PA Carlyon Shadow with Dr Lucie's office and rec'd injection in shoulder. She has pain with ROM. Xray revealed severe glenohumeral arthritis.  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 She is followed by Dr Joya Gaskins at [facility]. Last injection given 07/29/17.  Allergic rhinitis - stable on flonase/loratidine  COPD - stable on symbicort and spiriva. Uses HFA prn. She has not had PFTs in several yrs. She stopped taking daliresp some time ago  HTN - controlled. Followed by cardio Dr Gwenlyn Found. Current meds consist of valsartan and metoprolol. She has been taking losartan due to recall on valsartan but then no longer on recall. She  states cost of losartan < valsartan and she would like to change med    PAF - rate controlled on metoprolol. Followed by cardio Dr Gwenlyn Found. She had a carotid study that showed stable stenosis. 2D echo showed nml EF in 2016 with stable prosthetic valve. Repeat 2D echo in Aug 2017 showed nml LV size and fxn with biatrial enlargement; bioprosthetic valve functioning well. She has a hx AV replacement. She does not take anticoagulation. LDL 79     Past Medical History:  Diagnosis Date  . Aortic stenosis 07/20/2012   Aortic stenosis   . Aortic valve disorder 08/13/2011   ECHO - EF >09%; mild diastolic dysfunction; calcified aortic valve, not well visualized; mod/severe aortic stenosis w/ worsening gradients when compared to 2012  . Arthritis   . Cancer ([place])    Breast  . Carotid bruit 08/06/2008   Doppler - R ECA demonstrates noarrowing w/ elevated velocities consistent w/ >70% diameter reduction; R and L ICAs show no evidence of diameter reduction, significant tortuosity or vascular abnormality;   . Change in voice   . COPD (chronic obstructive pulmonary disease) ([place])   . Hearing loss   . Heart murmur   . Hyperlipidemia   . Hypertension    dr Gwenlyn Found  . Osteoporosis   . PAF (paroxysmal atrial fibrillation) ([place]) 09/04/2012  . PVD (peripheral vascular disease) ([place]) 08/13/2010   R/P MV - normal pattern of perfusion  in all regions, EF 76%; no significant wall abnormalities noted; normal perfusion study  . Right carotid bruit    high-grade right external carotid artery stenosis by 2 parts ultrasound 2 years ago  . S/P aortic valve replacement with bioprosthetic valve 08/30/2012   21 mm [facility] Ease bovine pericardial tissue valve    Past Surgical History:  Procedure Laterality Date  . ABDOMINAL HYSTERECTOMY     Partial  . AORTIC VALVE REPLACEMENT N/A 08/30/2012   Procedure: AORTIC VALVE REPLACEMENT (AVR);  Surgeon: Rexene Alberts, MD;  Location: [place];  Service: Open Heart Surgery;   Laterality: N/A;  . BIOPSY SHOULDER Left 10/08/2005   shave biopsy  . BREAST LUMPECTOMY  02/25/1997   right  . CARDIAC CATHETERIZATION    . [place]  . COSMETIC SURGERY Left 1997   Breast implant  . EYE SURGERY  1997   Cataract surgery  . INTRAOPERATIVE TRANSESOPHAGEAL ECHOCARDIOGRAM N/A 08/30/2012   Procedure: INTRAOPERATIVE TRANSESOPHAGEAL ECHOCARDIOGRAM;  Surgeon: Rexene Alberts, MD;  Location: [place];  Service: Open Heart Surgery;  Laterality: N/A;  . LEFT AND RIGHT HEART CATHETERIZATION WITH CORONARY ANGIOGRAM N/A 08/14/2012   Procedure: LEFT AND RIGHT HEART CATHETERIZATION WITH CORONARY ANGIOGRAM;  Surgeon: Lorretta Harp, MD;  Location: [facility] CATH LAB;  Service: Cardiovascular;  Laterality: N/A;  . LEFT HEART CATH  08/14/12   Nl cors, AS  . MASTECTOMY  09/29/95   left  . PARTIAL HYSTERECTOMY    . TOTAL HIP ARTHROPLASTY  09/2010     reports that she has never smoked. She has never used smokeless tobacco. She reports that she does not drink alcohol or use drugs. Social History   Socioeconomic History  . Marital status: Widowed    Spouse name: Not on file  . Number of children: Not on file  . Years of education: Not on file  . Highest education level: Not on file  Occupational History  . Not on file  Social Needs  . Financial resource strain: Not hard at all  . Food insecurity:    Worry: Never true    Inability: Never true  . Transportation needs:    Medical: No    Non-medical: No  Tobacco Use  . Smoking status: Never Smoker  . Smokeless tobacco: Never Used  Substance and Sexual Activity  . Alcohol use: No  . Drug use: No  . Sexual activity: Never  Lifestyle  . Physical activity:    Days per week: 7 days    Minutes per session: 10 min  . Stress: Only a little  Relationships  . Social connections:    Talks on phone: More than three times a week    Gets together: More than three times a week    Attends religious service: More than 4 times per year     Active member of club or organization: No    Attends meetings of clubs or organizations: Never    Relationship status: Widowed  . Intimate partner violence:    Fear of current or ex partner: No    Emotionally abused: No    Physically abused: No    Forced sexual activity: No  Other Topics Concern  . Not on file  Social History Narrative  . Not on file    Family History  Problem Relation Age of Onset  . Cancer Mother        Bladder  . Early death Father  Tractor accident  . Early death Brother        Radiation protection practitioner  . Early death Brother        during heart surgery    Allergies  Allergen Reactions  . Lisinopril Cough  . Codeine Rash    All over the body.    Outpatient Encounter Medications as of 09/16/2017  Medication Sig  . acetaminophen (TYLENOL) 325 MG tablet Take 2 tablets (650 mg total) by mouth every 6 (six) hours as needed.  Marland Kitchen albuterol (PROAIR HFA) 108 (90 Base) MCG/ACT inhaler INHALE 1 PUFF INTO THE LUNGS EVERY 6 (SIX) HOURS AS NEEDED FOR WHEEZING OR SHORTNESS OF BREATH.  Marland Kitchen Biotin 5000 MCG CAPS Take 1 capsule by mouth daily.  . budesonide-formoterol (SYMBICORT) 160-4.5 MCG/ACT inhaler Inhale 2 puffs into the lungs 2 (two) times daily.  . Calcium Citrate-Vitamin D (CITRACAL + D PO) Take 1 tablet by mouth 2 (two) times daily.   . [employer] LORATADINE 10 MG tablet TAKE 1 TABLET (10 MG TOTAL) BY MOUTH DAILY.  . fluticasone (FLONASE) 50 MCG/ACT nasal spray Place 1 spray into both nostrils 2 (two) times daily.  . hydrOXYzine (ATARAX/VISTARIL) 25 MG tablet TAKE 1 TABLET BY MOUTH AS NEEDED  . losartan (COZAAR) 25 MG tablet TAKE 1 TABLET (25 MG TOTAL) BY MOUTH DAILY. TAKE PLACE OF VALSARTAN  . Magnesium Hydroxide (PHILLIPS MILK OF MAGNESIA PO) Take 15 mL by mouth. Take daily at 6 o'clock at night  . metoprolol tartrate (LOPRESSOR) 25 MG tablet TAKE 1 TABLET (25 MG TOTAL) BY MOUTH 2 (TWO) TIMES DAILY.  . mirabegron ER (MYRBETRIQ) 50 MG TB24 tablet Take 50 mg by mouth daily.  Marland Kitchen  omeprazole (PRILOSEC) 20 MG capsule TAKE 2 CAPSULES BY MOUTH DAILY FOR REFLUX  . shark liver oil-cocoa butter (PREPARATION H) 0.25-3-85.5 % suppository Place 1 suppository rectally as needed.  . sodium chloride (OCEAN) 0.65 % SOLN nasal spray Place 1 spray into both nostrils as needed for congestion.  Marland Kitchen tiotropium (SPIRIVA) 18 MCG inhalation capsule Place 1 capsule (18 mcg total) into inhaler and inhale daily.  . vitamin E 400 UNIT capsule Take 400 Units by mouth daily.  . furosemide (LASIX) 20 MG tablet Take 1 tablet (20 mg total) by mouth daily for 3 days.  . [DISCONTINUED] amoxicillin-clavulanate (AUGMENTIN) 875-125 MG tablet Take 1 tablet by mouth 2 (two) times daily.  . [DISCONTINUED] predniSONE (DELTASONE) 10 MG tablet Take 4 tabs po daily x 3 days then 3 tabs x 3 days then 2 tabs x 3 days then 1 tab x 3 days for lungs   No facility-administered encounter medications on file as of 09/16/2017.     Review of Systems:  Review of Systems  Constitutional: Positive for fatigue.  Respiratory: Positive for shortness of breath and wheezing.   Cardiovascular: Positive for leg swelling. Negative for chest pain.  Musculoskeletal: Positive for arthralgias.  All other systems reviewed and are negative.   Health Maintenance  Topic Date Due  . INFLUENZA VACCINE  09/29/2017  . TETANUS/TDAP  05/27/2024  . DEXA SCAN  Completed  . PNA vac Low Risk Adult  Completed    Physical Exam: Vitals:   09/16/17 0843  BP: 122/76  Pulse: 69  Resp: 10  Temp: 98 F (36.7 C)  TempSrc: Oral  SpO2: 97%  Weight: 174 lb (78.9 kg)  Height: 5' 3"  (1.6 m)   Body mass index is 30.82 kg/m. Physical Exam  Constitutional: She is oriented to person, place, and time.  She appears well-developed and well-nourished.  HENT:  Mouth/Throat: Oropharynx is clear and moist. No oropharyngeal exudate.  MMM; no oral thrush  Eyes: Pupils are equal, round, and reactive to light. No scleral icterus.  Neck: Neck supple. No  JVD present. Carotid bruit is not present. No tracheal deviation present. No thyromegaly present.  Cardiovascular: Normal rate and intact distal pulses. An irregularly irregular rhythm present. Exam reveals no gallop and no friction rub.  Murmur (1/6 SEM) heard. +1 pitting LE edema b/l. No calf TTP  Pulmonary/Chest: Effort normal. No stridor. No respiratory distress. She has wheezes (end expiratory). She has no rales.  Abdominal: Soft. Normal appearance and bowel sounds are normal. She exhibits no distension and no mass. There is no hepatomegaly. There is no tenderness. There is no rigidity, no rebound and no guarding. No hernia.  obese  Musculoskeletal: She exhibits edema.  Lymphadenopathy:    She has no cervical adenopathy.  Neurological: She is alert and oriented to person, place, and time. She has normal reflexes.  Skin: Skin is warm and dry. No rash noted.  Psychiatric: She has a normal mood and affect. Her behavior is normal. Judgment and thought content normal.    Labs reviewed: Basic Metabolic Panel: Recent Labs    09/28/16 1423 05/23/17 0810 09/08/17 1625  NA 137 142 138  K 5.2 4.5 4.9  CL 101 104 101  CO2 25 31  --   GLUCOSE 95 97 93  BUN 12 15 9   CREATININE 1.01* 1.14* 1.20*  CALCIUM 10.0 9.7  --   TSH  --  1.50  --    Liver Function Tests: Recent Labs    05/23/17 0810  AST 16  ALT 11  BILITOT 0.5  PROT 5.9*   No results for input(s): LIPASE, AMYLASE in the last 8760 hours. No results for input(s): AMMONIA in the last 8760 hours. CBC: Recent Labs    05/23/17 0810 09/08/17 1625  WBC 6.0  --   NEUTROABS 3,402  --   HGB 12.1 11.9*  HCT 34.8* 35.0*  MCV 93.8  --   PLT 191  --    Lipid Panel: Recent Labs    05/23/17 0810  CHOL 146  HDL 52  LDLCALC 79  TRIG 72  CHOLHDL 2.8   Lab Results  Component Value Date   HGBA1C 5.5 08/28/2012    Procedures since last visit: No results found.  Assessment/Plan   ICD-10-CM   1. Bilateral lower  extremity edema R60.0 Brain Natriuretic Peptide    CMP with eGFR([facility])    CBC with Differential/Platelets    Urinalysis with Reflex Microscopic    CT ANGIO CHEST PE W OR WO CONTRAST    CANCELED: CT Angio Chest W/Cm &/Or Wo Cm   r/o CHF  2. Dyspnea on exertion R06.09 Brain Natriuretic Peptide    CMP with eGFR([facility])    CBC with Differential/Platelets    Urinalysis with Reflex Microscopic    CT ANGIO CHEST PE W OR WO CONTRAST    CANCELED: CT Angio Chest W/Cm &/Or Wo Cm  3. PAF (paroxysmal atrial fibrillation) (HCC) I48.0 Brain Natriuretic Peptide    CT ANGIO CHEST PE W OR WO CONTRAST    CANCELED: CT Angio Chest W/Cm &/Or Wo Cm  4. S/P aortic valve replacement with bioprosthetic valve Z95.3 CT ANGIO CHEST PE W OR WO CONTRAST  5. High risk medication use Z79.899   6. Essential hypertension I10   7. Chronic bronchitis, unspecified chronic bronchitis type ([place])  J42   8. CKD (chronic kidney disease) stage 3, GFR 30-59 ml/min (HCC) N18.3 CMP with eGFR([facility])    Urinalysis with Reflex Microscopic  9. Shortness of breath R06.02 CT ANGIO CHEST PE W OR WO CONTRAST    CANCELED: CT Angio Chest W/Cm &/Or Wo Cm   START FUROSEMIDE 20MG DAILY X 7 DAYS THEN USE AS NEEDED FOR SWELLING  Will call with lab results  Will call with CT results  Keep legs elevated while sitting  Wear TED/compression stockings 12 hours on and 12 hours off  Continue other medications as ordered  Follow up with specialists as scheduled  Follow up as scheduled or sooner if need be. Go to the ER if your symptoms worsen  ADDENDUM: S/W RADIOLOGY DR WATTS. CT ANGIO OF CHEST SHOWED TINY PE AND DOPPER [place] LE RECOMMENDED TO R/O DVT; b/l venous doppler study ordered  Daralyn Bert S. Perlie Gold  [facility] [address] [place], [place] [address] [phone] Cell (Monday-Friday 8 AM - 5 PM) [phone] After 5 PM and follow prompts

## 2017-09-16 NOTE — Patient Instructions (Signed)
START FUROSEMIDE 20MG  DAILY X 7 DAYS THEN USE AS NEEDED FOR SWELLING  Will call with lab results  Will call with CT results  Keep legs elevated while sitting  Wear TED/compression stockings 12 hours on and 12 hours off  Continue other medications as ordered  Follow up with specialists as scheduled  Follow up as scheduled or sooner if need be. Go to the ER if your symptoms worsen   Edema Edema is when you have too much fluid in your body or under your skin. Edema may make your legs, feet, and ankles swell up. Swelling is also common in looser tissues, like around your eyes. This is a common condition. It gets more common as you get older. There are many possible causes of edema. Eating too much salt (sodium) and being on your feet or sitting for a long time can cause edema in your legs, feet, and ankles. Hot weather may make edema worse. Edema is usually painless. Your skin may look swollen or shiny. Follow these instructions at home:  Keep the swollen body part raised (elevated) above the level of your heart when you are sitting or lying down.  Do not sit still or stand for a long time.  Do not wear tight clothes. Do not wear garters on your upper legs.  Exercise your legs. This can help the swelling go down.  Wear elastic bandages or support stockings as told by your doctor.  Eat a low-salt (low-sodium) diet to reduce fluid as told by your doctor.  Depending on the cause of your swelling, you may need to limit how much fluid you drink (fluid restriction).  Take over-the-counter and prescription medicines only as told by your doctor. Contact a doctor if:  Treatment is not working.  You have heart, liver, or kidney disease and have symptoms of edema.  You have sudden and unexplained weight gain. Get help right away if:  You have shortness of breath or chest pain.  You cannot breathe when you lie down.  You have pain, redness, or warmth in the swollen areas.  You  have heart, liver, or kidney disease and get edema all of a sudden.  You have a fever and your symptoms get worse all of a sudden. Summary  Edema is when you have too much fluid in your body or under your skin.  Edema may make your legs, feet, and ankles swell up. Swelling is also common in looser tissues, like around your eyes.  Raise (elevate) the swollen body part above the level of your heart when you are sitting or lying down.  Follow your doctor's instructions about diet and how much fluid you can drink (fluid restriction). This information is not intended to replace advice given to you by your health care provider. Make sure you discuss any questions you have with your health care provider. Document Released: 08/04/2007 Document Revised: 03/05/2016 Document Reviewed: 03/05/2016 Elsevier Interactive Patient Education  2017 [place] of Breath, Adult Shortness of breath means you have trouble breathing. Your lungs are organs for breathing. Follow these instructions at home: Pay attention to any changes in your symptoms. Take these actions to help with your condition:  Do not smoke. Smoking can cause shortness of breath. If you need help to quit smoking, ask your doctor.  Avoid things that can make it harder to breathe, such as: ? Mold. ? Dust. ? Air pollution. ? Chemical smells. ? Things that can cause allergy symptoms (allergens), if you  have allergies.  Keep your living space clean and free of mold and dust.  Rest as needed. Slowly return to your usual activities.  Take over-the-counter and prescription medicines, including oxygen and inhaled medicines, only as told by your doctor.  Keep all follow-up visits as told by your doctor. This is important.  Contact a doctor if:  Your condition does not get better as soon as expected.  You have a hard time doing your normal activities, even after you rest.  You have new symptoms. Get help right away  if:  You have trouble breathing when you are resting.  You feel light-headed or you faint.  You have a cough that is not helped by medicines.  You cough up blood.  You have pain with breathing.  You have pain in your chest, arms, shoulders, or belly (abdomen).  You have a fever.  You cannot walk up stairs.  You cannot exercise the way you normally do. This information is not intended to replace advice given to you by your health care provider. Make sure you discuss any questions you have with your health care provider. Document Released: 08/04/2007 Document Revised: 03/04/2016 Document Reviewed: 03/04/2016 Elsevier Interactive Patient Education  2017 [employer].

## 2017-09-17 LAB — URINALYSIS, ROUTINE W REFLEX MICROSCOPIC
Bilirubin Urine: NEGATIVE
Glucose, UA: NEGATIVE
Hgb urine dipstick: NEGATIVE
Ketones, ur: NEGATIVE
Leukocytes, UA: NEGATIVE
Nitrite: NEGATIVE
Protein, ur: NEGATIVE
Specific Gravity, Urine: 1.016 (ref 1.001–1.03)
pH: 7 (ref 5.0–8.0)

## 2017-09-17 LAB — CBC WITH DIFFERENTIAL/PLATELET
Basophils Absolute: 98 cells/uL (ref 0–200)
Basophils Relative: 1.4 %
Eosinophils Absolute: 182 cells/uL (ref 15–500)
Eosinophils Relative: 2.6 %
HCT: 35.3 % (ref 35.0–45.0)
Hemoglobin: 11.7 g/dL (ref 11.7–15.5)
Lymphs Abs: 1820 cells/uL (ref 850–3900)
MCH: 31.2 pg (ref 27.0–33.0)
MCHC: 33.1 g/dL (ref 32.0–36.0)
MCV: 94.1 fL (ref 80.0–100.0)
MPV: 10.1 fL (ref 7.5–12.5)
Monocytes Relative: 10.6 %
Neutro Abs: 4158 cells/uL (ref 1500–7800)
Neutrophils Relative %: 59.4 %
Platelets: 262 10*3/uL (ref 140–400)
RBC: 3.75 10*6/uL — ABNORMAL LOW (ref 3.80–5.10)
RDW: 13 % (ref 11.0–15.0)
Total Lymphocyte: 26 %
WBC mixed population: 742 cells/uL (ref 200–950)
WBC: 7 10*3/uL (ref 3.8–10.8)

## 2017-09-17 LAB — COMPLETE METABOLIC PANEL WITH GFR
AG Ratio: 2.2 (calc) (ref 1.0–2.5)
ALT: 72 U/L — ABNORMAL HIGH (ref 6–29)
AST: 68 U/L — ABNORMAL HIGH (ref 10–35)
Albumin: 4.1 g/dL (ref 3.6–5.1)
Alkaline phosphatase (APISO): 149 U/L — ABNORMAL HIGH (ref 33–130)
BUN/Creatinine Ratio: 14 (calc) (ref 6–22)
BUN: 16 mg/dL (ref 7–25)
CO2: 28 mmol/L (ref 20–32)
Calcium: 10 mg/dL (ref 8.6–10.4)
Chloride: 103 mmol/L (ref 98–110)
Creat: 1.11 mg/dL — ABNORMAL HIGH (ref 0.60–0.88)
GFR, Est African American: 53 mL/min/{1.73_m2} — ABNORMAL LOW (ref >=60)
GFR, Est Non African American: 46 mL/min/{1.73_m2} — ABNORMAL LOW (ref >=60)
Globulin: 1.9 g/dL (calc) (ref 1.9–3.7)
Glucose, Bld: 108 mg/dL (ref 65–139)
Potassium: 5.2 mmol/L (ref 3.5–5.3)
Sodium: 137 mmol/L (ref 135–146)
Total Bilirubin: 0.5 mg/dL (ref 0.2–1.2)
Total Protein: 6 g/dL — ABNORMAL LOW (ref 6.1–8.1)

## 2017-09-17 LAB — BRAIN NATRIURETIC PEPTIDE: Brain Natriuretic Peptide: 496 pg/mL — ABNORMAL HIGH (ref <100)

## 2017-09-20 MED ORDER — APIXABAN 2.5 MG PO TABS: 2.5000 mg | ORAL_TABLET | Freq: Two times a day (BID) | ORAL | 6 refills | Status: DC

## 2017-09-20 NOTE — Telephone Encounter (Signed)
Gayland Curry, DO  Denyse Amass, CMA        Dr. Gwenlyn Found recommended low dose eliquis which would be 2.5mg  po bid for anticoagulation for Mrs. Falter for the tiny blood clot in her peripheral lung. Please let her know this and also about her labs that I responded to. Thanks.    I spoke with patient and she verbalized understanding. She did not have any questions at this time but stated that her daughter may have some questions. I informed patient to have her daughter call the office and ask to speak with me.   Medication list has been updated and Rx has been sent to pharmacy.

## 2017-09-20 NOTE — Addendum Note (Signed)
Addended by: Denyse Amass on: 09/20/2017 09:05 AM   Modules accepted: Orders

## 2017-10-12 ENCOUNTER — Ambulatory Visit (HOSPITAL_COMMUNITY): Payer: Medicare Other | Attending: Cardiology

## 2017-10-12 ENCOUNTER — Other Ambulatory Visit: Payer: Self-pay

## 2017-10-12 DIAGNOSIS — Z953 Presence of xenogenic heart valve: Secondary | ICD-10-CM | POA: Insufficient documentation

## 2017-10-12 DIAGNOSIS — E669 Obesity, unspecified: Secondary | ICD-10-CM | POA: Insufficient documentation

## 2017-10-12 DIAGNOSIS — Z6830 Body mass index (BMI) 30.0-30.9, adult: Secondary | ICD-10-CM | POA: Insufficient documentation

## 2017-10-12 DIAGNOSIS — Z48812 Encounter for surgical aftercare following surgery on the circulatory system: Secondary | ICD-10-CM | POA: Insufficient documentation

## 2017-10-12 DIAGNOSIS — I480 Paroxysmal atrial fibrillation: Secondary | ICD-10-CM | POA: Diagnosis not present

## 2017-10-12 DIAGNOSIS — I359 Nonrheumatic aortic valve disorder, unspecified: Secondary | ICD-10-CM | POA: Diagnosis present

## 2017-10-16 ENCOUNTER — Other Ambulatory Visit: Payer: Self-pay | Admitting: Cardiovascular Disease

## 2017-10-17 ENCOUNTER — Telehealth: Payer: Self-pay | Admitting: *Deleted

## 2017-10-17 NOTE — Telephone Encounter (Signed)
Kimi, daughter called and stated that she had some concerns with patient's medication. Stated that patient takes Myrbetriq in the morning and Furosemide at dinner. Daughter is wondering is this should be switched or should patient just come off of Myrbetriq until her legs stop swelling and just take Furosemide in the morning. Not going to bathroom but 1 time at night.  Please Advise.   Also stated that patient is on her 2nd bottle of Furosemide and her leg is still swollen. Stated that she is wearing her hose as directed.

## 2017-10-17 NOTE — Telephone Encounter (Signed)
Rx request sent to pharmacy.  

## 2017-10-18 ENCOUNTER — Other Ambulatory Visit: Payer: Self-pay | Admitting: *Deleted

## 2017-10-18 DIAGNOSIS — Z953 Presence of xenogenic heart valve: Secondary | ICD-10-CM

## 2017-10-18 NOTE — Telephone Encounter (Signed)
I do not have any further recommendations at this time regarding her swelling; continue keeping legs elevated when seated; may need to take fluid pill every day; follow up with cardiology as scheduled; continue myrbetriq as ordered; follow up as scheduled

## 2017-10-19 ENCOUNTER — Encounter: Payer: Self-pay | Admitting: Internal Medicine

## 2017-10-19 NOTE — Telephone Encounter (Signed)
Kimi, daughter notified.

## 2017-11-04 DIAGNOSIS — J385 Laryngeal spasm: Secondary | ICD-10-CM | POA: Diagnosis not present

## 2017-11-08 ENCOUNTER — Encounter: Payer: Self-pay | Admitting: Internal Medicine

## 2017-11-08 ENCOUNTER — Ambulatory Visit (INDEPENDENT_AMBULATORY_CARE_PROVIDER_SITE_OTHER): Payer: Medicare Other | Admitting: Internal Medicine

## 2017-11-08 ENCOUNTER — Other Ambulatory Visit: Payer: Self-pay | Admitting: Internal Medicine

## 2017-11-08 VITALS — BP 126/74 | HR 73 | Temp 98.3°F | Ht 63.0 in | Wt 170.4 lb

## 2017-11-08 DIAGNOSIS — R600 Localized edema: Secondary | ICD-10-CM | POA: Diagnosis not present

## 2017-11-08 DIAGNOSIS — Z853 Personal history of malignant neoplasm of breast: Secondary | ICD-10-CM | POA: Diagnosis not present

## 2017-11-08 DIAGNOSIS — R945 Abnormal results of liver function studies: Secondary | ICD-10-CM

## 2017-11-08 DIAGNOSIS — N183 Chronic kidney disease, stage 3 unspecified: Secondary | ICD-10-CM

## 2017-11-08 DIAGNOSIS — I480 Paroxysmal atrial fibrillation: Secondary | ICD-10-CM | POA: Diagnosis not present

## 2017-11-08 DIAGNOSIS — R7989 Other specified abnormal findings of blood chemistry: Secondary | ICD-10-CM

## 2017-11-08 LAB — COMPLETE METABOLIC PANEL WITH GFR
AG Ratio: 2 (calc) (ref 1.0–2.5)
ALT: 26 U/L (ref 6–29)
AST: 32 U/L (ref 10–35)
Albumin: 4.1 g/dL (ref 3.6–5.1)
Alkaline phosphatase (APISO): 66 U/L (ref 33–130)
BUN/Creatinine Ratio: 11 (calc) (ref 6–22)
BUN: 12 mg/dL (ref 7–25)
CO2: 30 mmol/L (ref 20–32)
Calcium: 10.3 mg/dL (ref 8.6–10.4)
Chloride: 102 mmol/L (ref 98–110)
Creat: 1.14 mg/dL — ABNORMAL HIGH (ref 0.60–0.88)
GFR, Est African American: 51 mL/min/{1.73_m2} — ABNORMAL LOW (ref >=60)
GFR, Est Non African American: 44 mL/min/{1.73_m2} — ABNORMAL LOW (ref >=60)
Globulin: 2.1 g/dL (calc) (ref 1.9–3.7)
Glucose, Bld: 95 mg/dL (ref 65–139)
Potassium: 4.8 mmol/L (ref 3.5–5.3)
Sodium: 137 mmol/L (ref 135–146)
Total Bilirubin: 0.7 mg/dL (ref 0.2–1.2)
Total Protein: 6.2 g/dL (ref 6.1–8.1)

## 2017-11-08 MED ORDER — FUROSEMIDE 20 MG PO TABS: 20.0000 mg | ORAL_TABLET | Freq: Every day | ORAL | 6 refills | Status: DC

## 2017-11-08 NOTE — Progress Notes (Signed)
Patient ID: [name], female   DOB: [birth year], 84 y.o.   MRN: [id number]   [facility] OFFICE  Provider: DR Arletha Grippe  Code Status:  Goals of Care:  Advanced Directives 11/08/2017  Does Patient Have a Medical Advance Directive? No  Would patient like information on creating a medical advance directive? -  Pre-existing out of facility DNR order (yellow form or pink MOST form) -     Chief Complaint  Patient presents with  . Acute Visit    Pt is being seen due to ongoing lower leg swelling for last 2 months.     HPI: Patient is a 84 y.o. female seen today for an acute visit for BLE edema. She wears TED stockings as ordered. She is unable to sit with legs elevated above heart as she gets nauseated. She also takes lasix every day. In July 2019, K 5.2; Cr 1.11. LE venous doppler US neg DVT in July 2019. CTA chest revealed tiny right lung nonocclusive clot. She takes eliquis for anticoagulation. She also has PAF  Past Medical History:  Diagnosis Date  . Aortic stenosis 07/20/2012   Aortic stenosis   . Aortic valve disorder 08/13/2011   ECHO - EF >16%; mild diastolic dysfunction; calcified aortic valve, not well visualized; mod/severe aortic stenosis w/ worsening gradients when compared to 2012  . Arthritis   . Cancer ([place])    Breast  . Carotid bruit 08/06/2008   Doppler - R ECA demonstrates noarrowing w/ elevated velocities consistent w/ >70% diameter reduction; R and L ICAs show no evidence of diameter reduction, significant tortuosity or vascular abnormality;   . Change in voice   . COPD (chronic obstructive pulmonary disease) ([place])   . Hearing loss   . Heart murmur   . Hyperlipidemia   . Hypertension    dr Gwenlyn Found  . Osteoporosis   . PAF (paroxysmal atrial fibrillation) ([place]) 09/04/2012  . PVD (peripheral vascular disease) ([place]) 08/13/2010   R/P MV - normal pattern of perfusion in all regions, EF 76%; no significant wall abnormalities noted; normal perfusion study  . Right carotid  bruit    high-grade right external carotid artery stenosis by 2 parts ultrasound 2 years ago  . S/P aortic valve replacement with bioprosthetic valve 08/30/2012   21 mm [facility] Ease bovine pericardial tissue valve    Past Surgical History:  Procedure Laterality Date  . ABDOMINAL HYSTERECTOMY     Partial  . AORTIC VALVE REPLACEMENT N/A 08/30/2012   Procedure: AORTIC VALVE REPLACEMENT (AVR);  Surgeon: Rexene Alberts, MD;  Location: [place];  Service: Open Heart Surgery;  Laterality: N/A;  . BIOPSY SHOULDER Left 10/08/2005   shave biopsy  . BREAST LUMPECTOMY  02/25/1997   right  . CARDIAC CATHETERIZATION    . [place]  . COSMETIC SURGERY Left 1997   Breast implant  . EYE SURGERY  1997   Cataract surgery  . INTRAOPERATIVE TRANSESOPHAGEAL ECHOCARDIOGRAM N/A 08/30/2012   Procedure: INTRAOPERATIVE TRANSESOPHAGEAL ECHOCARDIOGRAM;  Surgeon: Rexene Alberts, MD;  Location: [place];  Service: Open Heart Surgery;  Laterality: N/A;  . LEFT AND RIGHT HEART CATHETERIZATION WITH CORONARY ANGIOGRAM N/A 08/14/2012   Procedure: LEFT AND RIGHT HEART CATHETERIZATION WITH CORONARY ANGIOGRAM;  Surgeon: Lorretta Harp, MD;  Location: [facility] CATH LAB;  Service: Cardiovascular;  Laterality: N/A;  . LEFT HEART CATH  08/14/12   Nl cors, AS  . MASTECTOMY  09/29/95   left  . PARTIAL HYSTERECTOMY    .  TOTAL HIP ARTHROPLASTY  09/2010     reports that she has never smoked. She has never used smokeless tobacco. She reports that she does not drink alcohol or use drugs. Social History   Socioeconomic History  . Marital status: Widowed    Spouse name: Not on file  . Number of children: Not on file  . Years of education: Not on file  . Highest education level: Not on file  Occupational History  . Not on file  Social Needs  . Financial resource strain: Not hard at all  . Food insecurity:    Worry: Never true    Inability: Never true  . Transportation needs:    Medical: No    Non-medical: No  Tobacco  Use  . Smoking status: Never Smoker  . Smokeless tobacco: Never Used  Substance and Sexual Activity  . Alcohol use: No  . Drug use: No  . Sexual activity: Never  Lifestyle  . Physical activity:    Days per week: 7 days    Minutes per session: 10 min  . Stress: Only a little  Relationships  . Social connections:    Talks on phone: More than three times a week    Gets together: More than three times a week    Attends religious service: More than 4 times per year    Active member of club or organization: No    Attends meetings of clubs or organizations: Never    Relationship status: Widowed  . Intimate partner violence:    Fear of current or ex partner: No    Emotionally abused: No    Physically abused: No    Forced sexual activity: No  Other Topics Concern  . Not on file  Social History Narrative  . Not on file    Family History  Problem Relation Age of Onset  . Cancer Mother        Bladder  . Early death Father        Tractor accident  . Early death Brother        Radiation protection practitioner  . Early death Brother        during heart surgery    Allergies  Allergen Reactions  . Lisinopril Cough  . Codeine Rash    All over the body.    Outpatient Encounter Medications as of 11/08/2017  Medication Sig  . acetaminophen (TYLENOL) 325 MG tablet Take 2 tablets (650 mg total) by mouth every 6 (six) hours as needed.  Marland Kitchen albuterol (PROAIR HFA) 108 (90 Base) MCG/ACT inhaler INHALE 1 PUFF INTO THE LUNGS EVERY 6 (SIX) HOURS AS NEEDED FOR WHEEZING OR SHORTNESS OF BREATH.  Marland Kitchen apixaban (ELIQUIS) 2.5 MG TABS tablet Take 1 tablet (2.5 mg total) by mouth 2 (two) times daily.  . Biotin 5000 MCG CAPS Take 1 capsule by mouth daily.  . budesonide-formoterol (SYMBICORT) 160-4.5 MCG/ACT inhaler Inhale 2 puffs into the lungs 2 (two) times daily.  . Calcium Citrate-Vitamin D (CITRACAL + D PO) Take 1 tablet by mouth 2 (two) times daily.   . [employer]S LORATADINE 10 MG tablet TAKE 1 TABLET (10 MG TOTAL) BY MOUTH  DAILY.  . fluticasone (FLONASE) 50 MCG/ACT nasal spray Place 1 spray into both nostrils 2 (two) times daily.  . furosemide (LASIX) 20 MG tablet Take 1 tablet (20 mg total) by mouth daily as needed.  . hydrOXYzine (ATARAX/VISTARIL) 25 MG tablet TAKE 1 TABLET BY MOUTH AS NEEDED  . losartan (COZAAR) 25 MG tablet TAKE  1 TABLET (25 MG TOTAL) BY MOUTH DAILY. TAKE PLACE OF VALSARTAN  . Magnesium Hydroxide (PHILLIPS MILK OF MAGNESIA PO) Take 15 mL by mouth. Take daily at 6 o'clock at night  . metoprolol tartrate (LOPRESSOR) 25 MG tablet TAKE 1 TABLET (25 MG TOTAL) BY MOUTH 2 (TWO) TIMES DAILY.  . mirabegron ER (MYRBETRIQ) 50 MG TB24 tablet Take 50 mg by mouth daily.  Marland Kitchen omeprazole (PRILOSEC) 20 MG capsule TAKE 2 CAPSULES BY MOUTH DAILY FOR REFLUX  . shark liver oil-cocoa butter (PREPARATION H) 0.25-3-85.5 % suppository Place 1 suppository rectally as needed.  . sodium chloride (OCEAN) 0.65 % SOLN nasal spray Place 1 spray into both nostrils as needed for congestion.  Marland Kitchen tiotropium (SPIRIVA) 18 MCG inhalation capsule Place 1 capsule (18 mcg total) into inhaler and inhale daily.  . vitamin E 400 UNIT capsule Take 400 Units by mouth daily.   No facility-administered encounter medications on file as of 11/08/2017.     Review of Systems:  Review of Systems  Cardiovascular: Positive for leg swelling.  Musculoskeletal: Positive for arthralgias and gait problem.  All other systems reviewed and are negative.   Health Maintenance  Topic Date Due  . INFLUENZA VACCINE  09/29/2017  . TETANUS/TDAP  05/27/2024  . DEXA SCAN  Completed  . PNA vac Low Risk Adult  Completed    Physical Exam: Vitals:   11/08/17 1453  BP: 126/74  Pulse: 73  Temp: 98.3 F (36.8 C)  TempSrc: Oral  SpO2: 96%  Weight: 170 lb 6.4 oz (77.3 kg)  Height: 5' 3"  (1.6 m)   Body mass index is 30.19 kg/m. Physical Exam  Constitutional: She is oriented to person, place, and time. She appears well-developed.  Frail appearing in NAD   Cardiovascular: Exam reveals no gallop and no friction rub.  Murmur (1/6 SEM) heard. Trace R>LLE edema; no calf TTP  Pulmonary/Chest: She has wheezes (spare end expiratory).  Musculoskeletal: She exhibits edema.  Neurological: She is alert and oriented to person, place, and time.  Skin: Skin is warm and dry. Rash (chronic venous stasis changes) noted.  Psychiatric: She has a normal mood and affect. Her behavior is normal. Judgment and thought content normal.    Labs reviewed: Basic Metabolic Panel: Recent Labs    05/23/17 0810 09/08/17 1625 09/16/17 0939  NA 142 138 137  K 4.5 4.9 5.2  CL 104 101 103  CO2 31  --  28  GLUCOSE 97 93 108  BUN 15 9 16   CREATININE 1.14* 1.20* 1.11*  CALCIUM 9.7  --  10.0  TSH 1.50  --   --    Liver Function Tests: Recent Labs    05/23/17 0810 09/16/17 0939  AST 16 68*  ALT 11 72*  BILITOT 0.5 0.5  PROT 5.9* 6.0*   No results for input(s): LIPASE, AMYLASE in the last 8760 hours. No results for input(s): AMMONIA in the last 8760 hours. CBC: Recent Labs    05/23/17 0810 09/08/17 1625 09/16/17 0939  WBC 6.0  --  7.0  NEUTROABS 3,402  --  4,158  HGB 12.1 11.9* 11.7  HCT 34.8* 35.0* 35.3  MCV 93.8  --  94.1  PLT 191  --  262   Lipid Panel: Recent Labs    05/23/17 0810  CHOL 146  HDL 52  LDLCALC 79  TRIG 72  CHOLHDL 2.8   Lab Results  Component Value Date   HGBA1C 5.5 08/28/2012    Procedures since last visit: No  results found.  Assessment/Plan   ICD-10-CM   1. Elevated LFTs R94.5 CMP with eGFR([facility])    US Abdomen Limited RUQ  2. Bilateral lower extremity edema R60.0 CMP with eGFR([facility])    furosemide (LASIX) 20 MG tablet  3. PAF (paroxysmal atrial fibrillation) (HCC) I48.0   4. CKD (chronic kidney disease) stage 3, GFR 30-59 ml/min (HCC) N18.3   5. History of breast cancer Z85.3    CONTINUE CURRENT MEDICATIONS AS ORDERED  Will call with abdominal US appt to work up elevated liver enzymes  Will call with lab  results  Follow up as scheduled or sooner if need be.   Leanny Moeckel S. Perlie Gold  [facility] [address] [place],  [address] [phone] Cell (Monday-Friday 8 AM - 5 PM) [phone] After 5 PM and follow prompts

## 2017-11-08 NOTE — Patient Instructions (Addendum)
CONTINUE CURRENT MEDICATIONS AS ORDERED  Will call with abdominal US appt to work up elevated liver enzymes  Will call with lab results  Follow up as scheduled or sooner if need be.

## 2017-11-22 ENCOUNTER — Ambulatory Visit
Admission: RE | Admit: 2017-11-22 | Discharge: 2017-11-22 | Disposition: A | Discharge status: Home / Self Care | Payer: Medicare Other | Source: Ambulatory Visit | Attending: Internal Medicine | Admitting: Internal Medicine

## 2017-11-22 DIAGNOSIS — R748 Abnormal levels of other serum enzymes: Secondary | ICD-10-CM | POA: Diagnosis not present

## 2017-11-22 DIAGNOSIS — R7989 Other specified abnormal findings of blood chemistry: Secondary | ICD-10-CM

## 2017-11-22 DIAGNOSIS — R945 Abnormal results of liver function studies: Principal | ICD-10-CM

## 2017-11-23 ENCOUNTER — Other Ambulatory Visit: Payer: Self-pay

## 2017-11-23 DIAGNOSIS — K8020 Calculus of gallbladder without cholecystitis without obstruction: Secondary | ICD-10-CM

## 2017-11-23 DIAGNOSIS — R935 Abnormal findings on diagnostic imaging of other abdominal regions, including retroperitoneum: Secondary | ICD-10-CM

## 2017-12-05 ENCOUNTER — Telehealth: Payer: Self-pay

## 2017-12-05 NOTE — Telephone Encounter (Signed)
Continue fluid pill as ordered and follow up as scheduled

## 2017-12-05 NOTE — Telephone Encounter (Signed)
Patient called to inform Dr.Carter that the fluid pill is only helping a little. Patient still with swelling.  Patient has pending appointment on 12/20/17

## 2017-12-06 NOTE — Telephone Encounter (Signed)
Patient notified and agreed.  

## 2017-12-06 NOTE — Telephone Encounter (Signed)
LMOM to [facility]

## 2017-12-16 ENCOUNTER — Other Ambulatory Visit: Payer: Self-pay | Admitting: Cardiovascular Disease

## 2017-12-19 DIAGNOSIS — K8020 Calculus of gallbladder without cholecystitis without obstruction: Secondary | ICD-10-CM | POA: Diagnosis not present

## 2017-12-19 DIAGNOSIS — R945 Abnormal results of liver function studies: Secondary | ICD-10-CM | POA: Diagnosis not present

## 2017-12-20 ENCOUNTER — Ambulatory Visit (INDEPENDENT_AMBULATORY_CARE_PROVIDER_SITE_OTHER): Payer: Medicare Other | Admitting: Internal Medicine

## 2017-12-20 ENCOUNTER — Encounter: Payer: Self-pay | Admitting: Internal Medicine

## 2017-12-20 VITALS — BP 150/82 | HR 80 | Temp 98.0°F | Ht 63.0 in | Wt 172.0 lb

## 2017-12-20 DIAGNOSIS — Z23 Encounter for immunization: Secondary | ICD-10-CM | POA: Diagnosis not present

## 2017-12-20 DIAGNOSIS — Z953 Presence of xenogenic heart valve: Secondary | ICD-10-CM | POA: Diagnosis not present

## 2017-12-20 DIAGNOSIS — I480 Paroxysmal atrial fibrillation: Secondary | ICD-10-CM

## 2017-12-20 DIAGNOSIS — R0600 Dyspnea, unspecified: Secondary | ICD-10-CM

## 2017-12-20 DIAGNOSIS — J383 Other diseases of vocal cords: Secondary | ICD-10-CM | POA: Diagnosis not present

## 2017-12-20 DIAGNOSIS — J42 Unspecified chronic bronchitis: Secondary | ICD-10-CM

## 2017-12-20 DIAGNOSIS — K8020 Calculus of gallbladder without cholecystitis without obstruction: Secondary | ICD-10-CM | POA: Diagnosis not present

## 2017-12-20 DIAGNOSIS — R600 Localized edema: Secondary | ICD-10-CM

## 2017-12-20 DIAGNOSIS — R0609 Other forms of dyspnea: Secondary | ICD-10-CM | POA: Diagnosis not present

## 2017-12-20 DIAGNOSIS — I2693 Single subsegmental pulmonary embolism without acute cor pulmonale: Secondary | ICD-10-CM | POA: Diagnosis not present

## 2017-12-20 NOTE — Progress Notes (Signed)
Patient ID: [name], female   DOB: 04-22-1931, 84 y.o.   MRN: [id number]   Location:  [facility] OFFICE  Provider: DR Arletha Grippe  Code Status:  Goals of Care:  Advanced Directives 11/08/2017  Does Patient Have a Medical Advance Directive? No  Would patient like information on creating a medical advance directive? -  Pre-existing out of facility DNR order (yellow form or pink MOST form) -     Chief Complaint  Patient presents with  . Medical Management of Chronic Issues    4 month follow-up on COPD, HTN, PAF, and OAB   . Immunizations    Flu Vaccine     HPI: Patient is a 84 y.o. female seen today for medical management of chronic diseases.  She saw gen sx Dr Harrington Challenger yesterday for elevated LFTs and gallstones. No sx recommended at this time. She remains asymptomatic   Gallstones - LFTs back to nml with Tbili 0.7; RUQ [place] 11/22/17 revealed multiple gallstones but no cholecystitis or biliary distension; fatty liver  Chronic right shoulder pain - followed by Ortho Dr Laureen Ochs; her last shoulder injection was 09/13/17 by PA Carlyon Shadow. She has pain with ROM. Xray revealed severe glenohumeral arthritis.  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 She is followed by Dr Joya Gaskins at [facility]. Last injection given 07/29/17.  Allergic rhinitis - stable on flonase/loratidine  COPD - stable on symbicort and spiriva. Uses HFA prn. She has not had PFTs in several yrs. She stopped taking daliresp some time ago  HTN - controlled. Followed by cardio Dr Gwenlyn Found. Current meds consist of valsartan and metoprolol. She has been taking losartan due to recall on valsartan but then no longer on recall. She states cost of losartan < valsartan and she would like to change med   PAF - rate controlled on metoprolol. Followed by cardio Dr Gwenlyn Found. She had a carotid study that showed stable stenosis. 2D echo showed nml EF in 2016 with stable prosthetic valve. Repeat 2D echo in Aug 2017 showed  nml LV size and fxn with biatrial enlargement; bioprosthetic valve functioning well. She has a hx AV replacement. She does not take anticoagulation. LDL 79  Past Medical History:  Diagnosis Date  . Aortic stenosis 07/20/2012   Aortic stenosis   . Aortic valve disorder 08/13/2011   ECHO - EF >40%; mild diastolic dysfunction; calcified aortic valve, not well visualized; mod/severe aortic stenosis w/ worsening gradients when compared to 2012  . Arthritis   . Cancer ([place])    Breast  . Carotid bruit 08/06/2008   Doppler - R ECA demonstrates noarrowing w/ elevated velocities consistent w/ >70% diameter reduction; R and L ICAs show no evidence of diameter reduction, significant tortuosity or vascular abnormality;   . Change in voice   . COPD (chronic obstructive pulmonary disease) ([place])   . Hearing loss   . Heart murmur   . Hyperlipidemia   . Hypertension    dr Gwenlyn Found  . Osteoporosis   . PAF (paroxysmal atrial fibrillation) ([place]) 09/04/2012  . PVD (peripheral vascular disease) ([place]) 08/13/2010   R/P MV - normal pattern of perfusion in all regions, EF 76%; no significant wall abnormalities noted; normal perfusion study  . Right carotid bruit    high-grade right external carotid artery stenosis by 2 parts ultrasound 2 years ago  . S/P aortic valve replacement with bioprosthetic valve 08/30/2012   21 mm [facility] Ease bovine pericardial tissue valve  Past Surgical History:  Procedure Laterality Date  . ABDOMINAL HYSTERECTOMY     Partial  . AORTIC VALVE REPLACEMENT N/A 08/30/2012   Procedure: AORTIC VALVE REPLACEMENT (AVR);  Surgeon: Rexene Alberts, MD;  Location: [place];  Service: Open Heart Surgery;  Laterality: N/A;  . BIOPSY SHOULDER Left 10/08/2005   shave biopsy  . BREAST LUMPECTOMY  02/25/1997   right  . CARDIAC CATHETERIZATION    . [place]  . COSMETIC SURGERY Left 1997   Breast implant  . EYE SURGERY  1997   Cataract surgery  . INTRAOPERATIVE TRANSESOPHAGEAL  ECHOCARDIOGRAM N/A 08/30/2012   Procedure: INTRAOPERATIVE TRANSESOPHAGEAL ECHOCARDIOGRAM;  Surgeon: Rexene Alberts, MD;  Location: [place];  Service: Open Heart Surgery;  Laterality: N/A;  . LEFT AND RIGHT HEART CATHETERIZATION WITH CORONARY ANGIOGRAM N/A 08/14/2012   Procedure: LEFT AND RIGHT HEART CATHETERIZATION WITH CORONARY ANGIOGRAM;  Surgeon: Lorretta Harp, MD;  Location: [facility] CATH LAB;  Service: Cardiovascular;  Laterality: N/A;  . LEFT HEART CATH  08/14/12   Nl cors, AS  . MASTECTOMY  09/29/95   left  . PARTIAL HYSTERECTOMY    . TOTAL HIP ARTHROPLASTY  09/2010     reports that she has never smoked. She has never used smokeless tobacco. She reports that she does not drink alcohol or use drugs. Social History   Socioeconomic History  . Marital status: Widowed    Spouse name: Not on file  . Number of children: Not on file  . Years of education: Not on file  . Highest education level: Not on file  Occupational History  . Not on file  Social Needs  . Financial resource strain: Not hard at all  . Food insecurity:    Worry: Never true    Inability: Never true  . Transportation needs:    Medical: No    Non-medical: No  Tobacco Use  . Smoking status: Never Smoker  . Smokeless tobacco: Never Used  Substance and Sexual Activity  . Alcohol use: No  . Drug use: No  . Sexual activity: Never  Lifestyle  . Physical activity:    Days per week: 7 days    Minutes per session: 10 min  . Stress: Only a little  Relationships  . Social connections:    Talks on phone: More than three times a week    Gets together: More than three times a week    Attends religious service: More than 4 times per year    Active member of club or organization: No    Attends meetings of clubs or organizations: Never    Relationship status: Widowed  . Intimate partner violence:    Fear of current or ex partner: No    Emotionally abused: No    Physically abused: No    Forced sexual activity: No  Other  Topics Concern  . Not on file  Social History Narrative  . Not on file    Family History  Problem Relation Age of Onset  . Cancer Mother        Bladder  . Early death Father        Tractor accident  . Early death Brother        Radiation protection practitioner  . Early death Brother        during heart surgery    Allergies  Allergen Reactions  . Lisinopril Cough  . Codeine Rash    All over the body.    Outpatient Encounter  Medications as of 12/20/2017  Medication Sig  . acetaminophen (TYLENOL) 325 MG tablet Take 2 tablets (650 mg total) by mouth every 6 (six) hours as needed.  Marland Kitchen albuterol (PROAIR HFA) 108 (90 Base) MCG/ACT inhaler INHALE 1 PUFF INTO THE LUNGS EVERY 6 (SIX) HOURS AS NEEDED FOR WHEEZING OR SHORTNESS OF BREATH.  Marland Kitchen apixaban (ELIQUIS) 2.5 MG TABS tablet Take 1 tablet (2.5 mg total) by mouth 2 (two) times daily.  . Biotin 5000 MCG CAPS Take 1 capsule by mouth daily.  . budesonide-formoterol (SYMBICORT) 160-4.5 MCG/ACT inhaler Inhale 2 puffs into the lungs 2 (two) times daily.  . Calcium Citrate-Vitamin D (CITRACAL + D PO) Take 1 tablet by mouth 2 (two) times daily.   . CVS LORATADINE 10 MG tablet TAKE 1 TABLET (10 MG TOTAL) BY MOUTH DAILY.  . fluticasone (FLONASE) 50 MCG/ACT nasal spray Place 1 spray into both nostrils 2 (two) times daily.  . furosemide (LASIX) 20 MG tablet Take 1 tablet (20 mg total) by mouth daily.  . hydrOXYzine (ATARAX/VISTARIL) 25 MG tablet TAKE 1 TABLET BY MOUTH AS NEEDED  . losartan (COZAAR) 25 MG tablet TAKE 1 TABLET (25 MG TOTAL) BY MOUTH DAILY. TAKE PLACE OF VALSARTAN  . Magnesium Hydroxide (PHILLIPS MILK OF MAGNESIA PO) Take 15 mL by mouth. Take daily at 6 o'clock at night  . metoprolol tartrate (LOPRESSOR) 25 MG tablet TAKE 1 TABLET (25 MG TOTAL) BY MOUTH 2 (TWO) TIMES DAILY.  . mirabegron ER (MYRBETRIQ) 50 MG TB24 tablet Take 50 mg by mouth daily.  Marland Kitchen omeprazole (PRILOSEC) 20 MG capsule TAKE 2 CAPSULES BY MOUTH DAILY FOR REFLUX  . shark liver oil-cocoa  butter (PREPARATION H) 0.25-3-85.5 % suppository Place 1 suppository rectally as needed.  . sodium chloride (OCEAN) 0.65 % SOLN nasal spray Place 1 spray into both nostrils as needed for congestion.  Marland Kitchen tiotropium (SPIRIVA) 18 MCG inhalation capsule Place 1 capsule (18 mcg total) into inhaler and inhale daily.  . vitamin E 400 UNIT capsule Take 400 Units by mouth daily.   No facility-administered encounter medications on file as of 12/20/2017.     Review of Systems:  Review of Systems  Health Maintenance  Topic Date Due  . INFLUENZA VACCINE  09/29/2017  . TETANUS/TDAP  05/27/2024  . DEXA SCAN  Completed  . PNA vac Low Risk Adult  Completed    Physical Exam: Vitals:   12/20/17 1418  Weight: 172 lb (78 kg)  Height: 5\' 3"  (1.6 m)   Body mass index is 30.47 kg/m. Physical Exam  Constitutional: She is oriented to person, place, and time. She appears well-developed and well-nourished.  HENT:  Mouth/Throat: Oropharynx is clear and moist. No oropharyngeal exudate.  MMM; no oral thrush  Eyes: Pupils are equal, round, and reactive to light. No scleral icterus.  Neck: Neck supple. Carotid bruit is not present. No tracheal deviation present. No thyromegaly present.  Cardiovascular: Normal rate and intact distal pulses. An irregularly irregular rhythm present. Exam reveals no gallop and no friction rub.  Murmur (1/6 SEM) heard. +1 pitting LE edema b/l; no calf TTP; varicose veins palpable b/l calf; TED stockings intact b/l LE  Pulmonary/Chest: Effort normal. No stridor. No respiratory distress. She has no wheezes. She has rales. She exhibits no tenderness.  Abdominal: Soft. Normal appearance and bowel sounds are normal. She exhibits no distension and no mass. There is no hepatomegaly. There is no tenderness. There is no rigidity, no rebound and no guarding. No hernia.  obese  Musculoskeletal: She exhibits edema (small joints).  Lymphadenopathy:    She has no cervical adenopathy.    Neurological: She is alert and oriented to person, place, and time. She has normal reflexes.  Skin: Skin is warm and dry. No rash noted.  Psychiatric: She has a normal mood and affect. Her behavior is normal. Judgment and thought content normal.  Voice is hoarse    Labs reviewed: Basic Metabolic Panel: Recent Labs    05/23/17 0810 09/08/17 1625 09/16/17 0939 11/08/17 1603  NA 142 138 137 137  K 4.5 4.9 5.2 4.8  CL 104 101 103 102  CO2 31  --  28 30  GLUCOSE 97 93 108 95  BUN 15 9 16 12   CREATININE 1.14* 1.20* 1.11* 1.14*  CALCIUM 9.7  --  10.0 10.3  TSH 1.50  --   --   --    Liver Function Tests: Recent Labs    05/23/17 0810 09/16/17 0939 11/08/17 1603  AST 16 68* 32  ALT 11 72* 26  BILITOT 0.5 0.5 0.7  PROT 5.9* 6.0* 6.2   No results for input(s): LIPASE, AMYLASE in the last 8760 hours. No results for input(s): AMMONIA in the last 8760 hours. CBC: Recent Labs    05/23/17 0810 09/08/17 1625 09/16/17 0939  WBC 6.0  --  7.0  NEUTROABS 3,402  --  4,158  HGB 12.1 11.9* 11.7  HCT 34.8* 35.0* 35.3  MCV 93.8  --  94.1  PLT 191  --  262   Lipid Panel: Recent Labs    05/23/17 0810  CHOL 146  HDL 52  LDLCALC 79  TRIG 72  CHOLHDL 2.8   Lab Results  Component Value Date   HGBA1C 5.5 08/28/2012    Procedures since last visit: US Abdomen Limited Ruq  Result Date: 11/22/2017 CLINICAL DATA:  Elevated liver enzymes. EXAM: ULTRASOUND ABDOMEN LIMITED RIGHT UPPER QUADRANT COMPARISON:  Ultrasound 06/04/2014. FINDINGS: Gallbladder: Gallstones with the largest measuring 1.9 cm noted. No gallbladder wall thickening. Negative Murphy sign. Common bile duct: Diameter: 4.2 mm Liver: Increased echogenicity consistent fatty infiltration and/or hepatocellular disease. Portal vein is patent on color Doppler imaging with normal direction of blood flow towards the liver. IMPRESSION: 1. Multiple gallstones. No evidence of cholecystitis or biliary distention. 2. Echogenic liver  consistent fatty infiltration and/or hepatocellular disease. Electronically Signed   By: Marcello Moores  Register   On: 11/22/2017 14:25    Assessment/Plan   ICD-10-CM   1. Single subsegmental pulmonary embolism without acute cor pulmonale I26.93    right; nonobstructive  2. PAF (paroxysmal atrial fibrillation) (HCC) I48.0   3. Bilateral lower extremity edema R60.0   4. S/P aortic valve replacement with bioprosthetic valve Z95.3   5. Gallstones K80.20   6. Dyspnea on exertion R06.09   7. 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 Chronic bronchitis, unspecified chronic bronchitis type ([place]) J42    Influenza vaccine given today  Continue current medications as ordered  Recommend you stay on eliquis at least 6 mos for treatment of blood clot in lung but you should discuss with Dr Gwenlyn Found whether or no to continue taking medication as you have irregular heart beat and valve replacement which will put you at increased risk of blood clots  Follow up with specialists as scheduled  Follow up in 4 mos with Sherrie Mustache, NP-C for routine visit. Fasting labs prior to appt (cmp, lipid panel)     [name] S. Eulas Post, D. O., F. [place]  [facility] [address] [place], [place] [address] [phone] Cell (Monday-Friday 8 AM - 5 PM) [phone] After 5 PM and follow prompts

## 2017-12-20 NOTE — Patient Instructions (Addendum)
Influenza vaccine given today  Continue current medications as ordered  Recommend you stay on eliquis at least 6 mos for treatment of blood clot in lung but you should discuss with Dr Gwenlyn Found whether or no to continue taking medication as you have irregular heart beat and valve replacement which will put you at increased risk of blood clots  Follow up with specialists as scheduled  Follow up in 4 mos with Bonnie Mustache, NP-C for routine visit. Fasting labs prior to appt

## 2018-01-12 ENCOUNTER — Other Ambulatory Visit: Payer: Self-pay | Admitting: Cardiovascular Disease

## 2018-01-16 ENCOUNTER — Telehealth: Payer: Self-pay

## 2018-01-16 NOTE — Telephone Encounter (Signed)
Patient's daughter called requesting samples of Eliquis for medication is over 100 dollars and patient is in the doughnut whole.   Unfortunately we did not have any samples, I recommended that patient call cardiology to see it they can provider her with samples or call us next week to check.  Patient's daughter also asked if she could stop by one day this week and have a handicap placard filled out. I advised that usually forms are dropped off and we will call when ready for pick-up. Maudie Mercury will take her chances with waiting in office to see if form can be completed, patient with deadline by the end of November 2019.  S.Chrae B/CMA

## 2018-01-17 ENCOUNTER — Telehealth: Payer: Self-pay | Admitting: Cardiovascular Disease

## 2018-01-17 ENCOUNTER — Other Ambulatory Visit: Payer: Self-pay | Admitting: Cardiovascular Disease

## 2018-01-17 ENCOUNTER — Telehealth: Payer: Self-pay

## 2018-01-17 NOTE — Telephone Encounter (Signed)
New Message:      Pt c/o medication issue:  1. Name of Medication: apixaban (ELIQUIS) 2.5 MG TABS tablet  2. How are you currently taking this medication (dosage and times per day)? Take 1 tablet (2.5 mg total) by mouth 2 (two) times daily.  3. Are you having a reaction (difficulty breathing--STAT)? No  4. What is your medication issue? Pt's niece would like to discuss this medication with the nurse

## 2018-01-17 NOTE — Telephone Encounter (Signed)
Spoke with patients daughter and she only wants to speak with Luisa Dago RN with Dr Gwenlyn Found regarding Eliquis. Will forward as requested

## 2018-01-17 NOTE — Telephone Encounter (Signed)
Patient dropped of a disability parking placard application to be completed by provider. Pt stated that she needs this application back ASAP as her current placard expires at the end of this month.   Form was completed with information I could provide and placed in Jessica's review and sign folder for completion.

## 2018-01-17 NOTE — Telephone Encounter (Signed)
Disability parking placard application was completed by provider. I left a message for patient explaining that form was complete and it would be placed in the front desk filing cabinet for pick up.

## 2018-01-18 NOTE — Telephone Encounter (Signed)
Spoke with pt dtr, aware samples of eliquis will be placed at the front desk for pick up and an applications for patient assistance will be placed in the bag.

## 2018-01-19 ENCOUNTER — Telehealth: Payer: Self-pay | Admitting: Cardiovascular Disease

## 2018-01-19 NOTE — Telephone Encounter (Signed)
Spoke with daughter and patient will wait on Eliquis samples. Mailed 30 day card and patient assistance papers

## 2018-01-19 NOTE — Telephone Encounter (Signed)
New message:      Pt daughter is returning a call from yesterday.

## 2018-01-31 ENCOUNTER — Telehealth: Payer: Self-pay | Admitting: Cardiovascular Disease

## 2018-01-31 NOTE — Telephone Encounter (Signed)
Returned call to patient's daughter. Explained that no eliquis 2.5mg  samples are available but we can provide 5mg  samples she can cut in half. Daughter really only wants 2.5mg  samples and asked that we call when some come in. Explained that we have no way of knowing when eliquis 2.5mg  samples will be provided. She states patient has her Rx and has the medications, they are just hoping to acquire some samples for the future. She states they will call back at a later date to check on this.

## 2018-01-31 NOTE — Telephone Encounter (Signed)
  Patient calling the office for samples of medication:   1.  What medication and dosage are you requesting samples for? apixaban (ELIQUIS) 2.5 MG TABS tablet  2.  Are you currently out of this medication? Has a few days left

## 2018-02-08 ENCOUNTER — Other Ambulatory Visit: Payer: Self-pay | Admitting: Cardiovascular Disease

## 2018-02-08 DIAGNOSIS — M1712 Unilateral primary osteoarthritis, left knee: Secondary | ICD-10-CM | POA: Diagnosis not present

## 2018-02-08 DIAGNOSIS — M25562 Pain in left knee: Secondary | ICD-10-CM | POA: Diagnosis not present

## 2018-02-08 NOTE — Telephone Encounter (Signed)
Rx request sent to pharmacy.  

## 2018-03-17 DIAGNOSIS — J385 Laryngeal spasm: Secondary | ICD-10-CM | POA: Diagnosis not present

## 2018-04-04 ENCOUNTER — Other Ambulatory Visit: Payer: Self-pay | Admitting: *Deleted

## 2018-04-04 MED ORDER — APIXABAN 2.5 MG PO TABS: 2.5000 mg | ORAL_TABLET | Freq: Two times a day (BID) | ORAL | 0 refills | Status: DC

## 2018-04-04 NOTE — Telephone Encounter (Signed)
[facility] 

## 2018-04-07 DIAGNOSIS — N183 Chronic kidney disease, stage 3 unspecified: Secondary | ICD-10-CM

## 2018-04-07 DIAGNOSIS — Z79899 Other long term (current) drug therapy: Secondary | ICD-10-CM

## 2018-04-07 DIAGNOSIS — I10 Essential (primary) hypertension: Secondary | ICD-10-CM

## 2018-04-12 ENCOUNTER — Other Ambulatory Visit: Payer: Medicare Other

## 2018-04-12 DIAGNOSIS — N183 Chronic kidney disease, stage 3 unspecified: Secondary | ICD-10-CM

## 2018-04-12 DIAGNOSIS — I10 Essential (primary) hypertension: Secondary | ICD-10-CM

## 2018-04-12 DIAGNOSIS — Z79899 Other long term (current) drug therapy: Secondary | ICD-10-CM

## 2018-04-12 LAB — COMPLETE METABOLIC PANEL WITH GFR
AG Ratio: 2.5 (calc) (ref 1.0–2.5)
ALT: 36 U/L — ABNORMAL HIGH (ref 6–29)
AST: 36 U/L — ABNORMAL HIGH (ref 10–35)
Albumin: 4 g/dL (ref 3.6–5.1)
Alkaline phosphatase (APISO): 64 U/L (ref 37–153)
BUN/Creatinine Ratio: 13 (calc) (ref 6–22)
BUN: 17 mg/dL (ref 7–25)
CO2: 30 mmol/L (ref 20–32)
Calcium: 9.9 mg/dL (ref 8.6–10.4)
Chloride: 105 mmol/L (ref 98–110)
Creat: 1.3 mg/dL — ABNORMAL HIGH (ref 0.60–0.88)
GFR, Est African American: 44 mL/min/{1.73_m2} — ABNORMAL LOW (ref >=60)
GFR, Est Non African American: 38 mL/min/{1.73_m2} — ABNORMAL LOW (ref >=60)
Globulin: 1.6 g/dL (calc) — ABNORMAL LOW (ref 1.9–3.7)
Glucose, Bld: 91 mg/dL (ref 65–99)
Potassium: 5 mmol/L (ref 3.5–5.3)
Sodium: 142 mmol/L (ref 135–146)
Total Bilirubin: 0.6 mg/dL (ref 0.2–1.2)
Total Protein: 5.6 g/dL — ABNORMAL LOW (ref 6.1–8.1)

## 2018-04-12 LAB — LIPID PANEL
Cholesterol: 117 mg/dL (ref <200)
HDL: 42 mg/dL — ABNORMAL LOW (ref >=50)
LDL Cholesterol (Calc): 58 mg/dL (calc)
Non-HDL Cholesterol (Calc): 75 mg/dL (calc) (ref <130)
Total CHOL/HDL Ratio: 2.8 (calc) (ref <5.0)
Triglycerides: 89 mg/dL (ref <150)

## 2018-04-19 ENCOUNTER — Encounter: Payer: Self-pay | Admitting: Nurse Practitioner

## 2018-04-19 ENCOUNTER — Ambulatory Visit (INDEPENDENT_AMBULATORY_CARE_PROVIDER_SITE_OTHER): Payer: Medicare Other | Admitting: Nurse Practitioner

## 2018-04-19 VITALS — BP 142/80 | HR 88 | Temp 98.5°F | Resp 10 | Ht 63.0 in | Wt 161.0 lb

## 2018-04-19 DIAGNOSIS — R945 Abnormal results of liver function studies: Secondary | ICD-10-CM | POA: Diagnosis not present

## 2018-04-19 DIAGNOSIS — R600 Localized edema: Secondary | ICD-10-CM

## 2018-04-19 DIAGNOSIS — N3281 Overactive bladder: Secondary | ICD-10-CM | POA: Diagnosis not present

## 2018-04-19 DIAGNOSIS — N183 Chronic kidney disease, stage 3 unspecified: Secondary | ICD-10-CM

## 2018-04-19 DIAGNOSIS — J449 Chronic obstructive pulmonary disease, unspecified: Secondary | ICD-10-CM | POA: Diagnosis not present

## 2018-04-19 DIAGNOSIS — Z953 Presence of xenogenic heart valve: Secondary | ICD-10-CM

## 2018-04-19 DIAGNOSIS — R7989 Other specified abnormal findings of blood chemistry: Secondary | ICD-10-CM

## 2018-04-19 DIAGNOSIS — I480 Paroxysmal atrial fibrillation: Secondary | ICD-10-CM

## 2018-04-19 DIAGNOSIS — I2693 Single subsegmental pulmonary embolism without acute cor pulmonale: Secondary | ICD-10-CM

## 2018-04-19 DIAGNOSIS — J383 Other diseases of vocal cords: Secondary | ICD-10-CM | POA: Diagnosis not present

## 2018-04-19 MED ORDER — BUDESONIDE-FORMOTEROL FUMARATE 160-4.5 MCG/ACT IN AERO: 2.0000 | INHALATION_SPRAY | Freq: Two times a day (BID) | RESPIRATORY_TRACT | 3 refills | Status: DC

## 2018-04-19 MED ORDER — FUROSEMIDE 20 MG PO TABS: 20.0000 mg | ORAL_TABLET | Freq: Every day | ORAL | 6 refills | Status: DC

## 2018-04-19 MED ORDER — TIOTROPIUM BROMIDE MONOHYDRATE 18 MCG IN CAPS: 18.0000 ug | ORAL_CAPSULE | Freq: Every day | RESPIRATORY_TRACT | 12 refills | Status: DC

## 2018-04-19 NOTE — Progress Notes (Signed)
Careteam: Patient Care Team: Lauree Chandler, NP as PCP - General (Geriatric Medicine) Druscilla Brownie, MD as Consulting Physician (Dermatology) [name] Irani, MD (Inactive) as Consulting Physician (Gastroenterology) Neldon Mc, MD as Consulting Physician (General Surgery) Lorretta Harp, MD as Consulting Physician (Cardiology) Bjorn Loser, MD as Consulting Physician (Urology) Lavell Anchors, MD as Consulting Physician (Ophthalmology)  Advanced Directive information Does Patient Have a Medical Advance Directive?: No, Would patient like information on creating a medical advance directive?: Yes (MAU/Ambulatory/Procedural Areas - Information given)(given at previous visit )  Allergies  Allergen Reactions  . Lisinopril Cough  . Codeine Rash    All over the body.    Chief Complaint  Patient presents with  . Medical Management of Chronic Issues    4 month follow-up, discuss labs (copy printed). Patient c/o bilateral leg swelling   . Advanced Directive    Patient has HCPOA/Living will paperwork, not filled out yet   . Medication Management    Stopped fluid pill in december 2019 due to decreased swelling      HPI: Patient is a 85 y.o. female seen in the office today routine follow up.   Gallstones - She saw gen sx Dr Harrington Challenger for elevated LFTs and gallstones. No sx recommended at this time. She remains asymptomatic. Currently monitoring.  LFTs went back to nml with Tbili 0.7; RUQ [place] 11/22/17 revealed multiple gallstones but no cholecystitis or biliary distension; fatty liver  Chronic right shoulder pain - unchanged. followed by Ortho Dr Laureen Ochs; her last shoulder injection was 09/13/17 by PA Carlyon Shadow. She has pain with ROM. Xray revealed severe glenohumeral arthritis.  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 She is followed by Dr [facility]a [facility]. Last injection given 03/2018  Allergic rhinitis - stable on  flonase/loratidine  COPD - never smoked,  symbicort and spiriva on med list but has not had refilled since 2015. Reports they are expensive. Continues to have shortness of breath and cough and using albuterol 2 puffs daily. She has not had PFTs in several yrs. Breathing has not been well controlled since heart surgery 5 years ago. No increase in shortness of breath.   HTN - controlled. Followed by cardio Dr Gwenlyn Found. Current meds consist of losartan and metoprolol.   PAF - rate controlled on metoprolol. Followed by cardio Dr Gwenlyn Found. She had a carotid study that showed stable stenosis. 2D echo showed nml EF in 2016 with stable prosthetic valve. Repeat 2D echo in Aug 2017 showed nml LV size and fxn with biatrial enlargement; bioprosthetic valve functioning well. She has a hx AV replacement. Currently eliquis twice daily. No bleeding.   Review of Systems:  Review of Systems  Constitutional: Negative for chills, fever and weight loss.  HENT: Negative for tinnitus.   Respiratory: Positive for cough. Negative for sputum production and shortness of breath.   Cardiovascular: Negative for chest pain, palpitations and leg swelling.  Gastrointestinal: Negative for abdominal pain, constipation, diarrhea and heartburn.  Genitourinary: Negative for dysuria, frequency and urgency.  Musculoskeletal: Positive for myalgias. Negative for back pain, falls and joint pain.  Skin: Negative.   Neurological: Negative for dizziness and headaches.  Psychiatric/Behavioral: Negative for depression and memory loss. The patient does not have insomnia.     Past Medical History:  Diagnosis Date  . Aortic stenosis 07/20/2012   Aortic stenosis   . Aortic valve disorder 08/13/2011   ECHO - EF >03%; mild diastolic dysfunction; calcified aortic valve, not  well visualized; mod/severe aortic stenosis w/ worsening gradients when compared to 2012  . Arthritis   . Cancer ([place])    Breast  . Carotid bruit 08/06/2008   Doppler - R ECA  demonstrates noarrowing w/ elevated velocities consistent w/ >70% diameter reduction; R and L ICAs show no evidence of diameter reduction, significant tortuosity or vascular abnormality;   . Change in voice   . COPD (chronic obstructive pulmonary disease) ([place])   . Hearing loss   . Heart murmur   . Hyperlipidemia   . Hypertension    dr Gwenlyn Found  . Osteoporosis   . PAF (paroxysmal atrial fibrillation) ([place]) 09/04/2012  . PVD (peripheral vascular disease) ([place]) 08/13/2010   R/P MV - normal pattern of perfusion in all regions, EF 76%; no significant wall abnormalities noted; normal perfusion study  . Right carotid bruit    high-grade right external carotid artery stenosis by 2 parts ultrasound 2 years ago  . S/P aortic valve replacement with bioprosthetic valve 08/30/2012   21 mm [facility] Ease bovine pericardial tissue valve   Past Surgical History:  Procedure Laterality Date  . ABDOMINAL HYSTERECTOMY     Partial  . AORTIC VALVE REPLACEMENT N/A 08/30/2012   Procedure: AORTIC VALVE REPLACEMENT (AVR);  Surgeon: Rexene Alberts, MD;  Location: [place];  Service: Open Heart Surgery;  Laterality: N/A;  . BIOPSY SHOULDER Left 10/08/2005   shave biopsy  . BREAST LUMPECTOMY  02/25/1997   right  . CARDIAC CATHETERIZATION    . [place]  . COSMETIC SURGERY Left 1997   Breast implant  . EYE SURGERY  1997   Cataract surgery  . INTRAOPERATIVE TRANSESOPHAGEAL ECHOCARDIOGRAM N/A 08/30/2012   Procedure: INTRAOPERATIVE TRANSESOPHAGEAL ECHOCARDIOGRAM;  Surgeon: Rexene Alberts, MD;  Location: [facility];  Service: Open Heart Surgery;  Laterality: N/A;  . LEFT AND RIGHT HEART CATHETERIZATION WITH CORONARY ANGIOGRAM N/A 08/14/2012   Procedure: LEFT AND RIGHT HEART CATHETERIZATION WITH CORONARY ANGIOGRAM;  Surgeon: Lorretta Harp, MD;  Location: [facility] CATH LAB;  Service: Cardiovascular;  Laterality: N/A;  . LEFT HEART CATH  08/14/12   Nl cors, AS  . MASTECTOMY  09/29/95   left  . PARTIAL HYSTERECTOMY    .  TOTAL HIP ARTHROPLASTY  09/2010   Social History:   reports that she has never smoked. She has never used smokeless tobacco. She reports that she does not drink alcohol or use drugs.  Family History  Problem Relation Age of Onset  . Cancer Mother        Bladder  . Early death Father        Tractor accident  . Early death Brother        Radiation protection practitioner  . Early death Brother        during heart surgery    Medications: Patient's Medications  New Prescriptions   No medications on file  Previous Medications   ACETAMINOPHEN (TYLENOL) 325 MG TABLET    Take 2 tablets (650 mg total) by mouth every 6 (six) hours as needed.   ALBUTEROL (PROAIR HFA) 108 (90 BASE) MCG/ACT INHALER    INHALE 1 PUFF INTO THE LUNGS EVERY 6 (SIX) HOURS AS NEEDED FOR WHEEZING OR SHORTNESS OF BREATH.   APIXABAN (ELIQUIS) 2.5 MG TABS TABLET    Take 1 tablet (2.5 mg total) by mouth 2 (two) times daily.   BIOTIN 5000 MCG CAPS    Take 1 capsule by mouth daily.   BUDESONIDE-FORMOTEROL (SYMBICORT) 160-4.5 MCG/ACT INHALER  Inhale 2 puffs into the lungs 2 (two) times daily.   CALCIUM CITRATE-VITAMIN D (CITRACAL + D PO)    Take 1 tablet by mouth 2 (two) times daily.    [facility] LORATADINE 10 MG TABLET    TAKE 1 TABLET (10 MG TOTAL) BY MOUTH DAILY.   FLUTICASONE (FLONASE) 50 MCG/ACT NASAL SPRAY    Place 1 spray into both nostrils 2 (two) times daily.   FUROSEMIDE (LASIX) 20 MG TABLET    Take 1 tablet (20 mg total) by mouth daily.   HYDROXYZINE (ATARAX/VISTARIL) 25 MG TABLET    TAKE 1 TABLET BY MOUTH AS NEEDED   LOSARTAN (COZAAR) 25 MG TABLET    TAKE 1 TABLET (25 MG TOTAL) BY MOUTH DAILY. TAKE PLACE OF VALSARTAN   MAGNESIUM HYDROXIDE (PHILLIPS MILK OF MAGNESIA PO)    Take 15 mL by mouth. Take daily at 6 o'clock at night   METOPROLOL TARTRATE (LOPRESSOR) 25 MG TABLET    TAKE 1 TABLET (25 MG TOTAL) BY MOUTH 2 (TWO) TIMES DAILY.   MIRABEGRON ER (MYRBETRIQ) 50 MG TB24 TABLET    Take 50 mg by mouth daily.   OMEPRAZOLE (PRILOSEC) 20 MG  CAPSULE    TAKE 2 CAPSULES BY MOUTH DAILY FOR REFLUX   SHARK LIVER OIL-COCOA BUTTER (PREPARATION H) 0.25-3-85.5 % SUPPOSITORY    Place 1 suppository rectally as needed.   SODIUM CHLORIDE (OCEAN) 0.65 % SOLN NASAL SPRAY    Place 1 spray into both nostrils as needed for congestion.   TIOTROPIUM (SPIRIVA) 18 MCG INHALATION CAPSULE    Place 1 capsule (18 mcg total) into inhaler and inhale daily.   VITAMIN E 400 UNIT CAPSULE    Take 400 Units by mouth daily.  Modified Medications   No medications on file  Discontinued Medications   No medications on file     Physical Exam:  Vitals:   04/19/18 1414  BP: (!) 142/80  Pulse: 88  Resp: 10  Temp: 98.5 F (36.9 C)  TempSrc: Oral  SpO2: 96%  Weight: 161 lb (73 kg)  Height: 5\' 3"  (1.6 m)   Body mass index is 28.52 kg/m.  Physical Exam Constitutional:      Appearance: Normal appearance. She is well-developed.  HENT:     Mouth/Throat:     Pharynx: No oropharyngeal exudate.  Eyes:     General: No scleral icterus.    Pupils: Pupils are equal, round, and reactive to light.  Neck:     Musculoskeletal: Neck supple.     Thyroid: No thyromegaly.     Vascular: No carotid bruit.     Trachea: No tracheal deviation.  Cardiovascular:     Rate and Rhythm: Normal rate. Rhythm irregularly irregular.     Heart sounds: Murmur (1/6 SEM) present. No friction rub. No gallop.      Comments: +1 pitting LE edema b/l; no calf TTP; varicose veins palpable b/l calf; TED stockings intact b/l LE Pulmonary:     Effort: Pulmonary effort is normal. No respiratory distress.     Breath sounds: No stridor. Decreased breath sounds and rales present. No wheezing.  Chest:     Chest wall: No tenderness.  Abdominal:     General: Bowel sounds are normal. There is no distension.     Palpations: Abdomen is soft. Abdomen is not rigid. There is no hepatomegaly or mass.     Tenderness: There is no abdominal tenderness. There is no guarding or rebound.     Hernia: No  hernia is present.     Comments: obese  Lymphadenopathy:     Cervical: No cervical adenopathy.  Skin:    General: Skin is warm and dry.     Findings: No rash.  Neurological:     Mental Status: She is alert and oriented to person, place, and time.     Deep Tendon Reflexes: Reflexes are normal and symmetric.  Psychiatric:        Behavior: Behavior normal.        Thought Content: Thought content normal.        Judgment: Judgment normal.     Comments: Voice is hoarse     Labs reviewed: Basic Metabolic Panel: Recent Labs    05/23/17 0810  09/16/17 0939 11/08/17 1603 04/12/18 0803  NA 142   < > 137 137 142  K 4.5   < > 5.2 4.8 5.0  CL 104   < > 103 102 105  CO2 31  --  28 30 30   GLUCOSE 97   < > 108 95 91  BUN 15   < > 16 12 17   CREATININE 1.14*   < > 1.11* 1.14* 1.30*  CALCIUM 9.7  --  10.0 10.3 9.9  TSH 1.50  --   --   --   --    < > = values in this interval not displayed.   Liver Function Tests: Recent Labs    09/16/17 0939 11/08/17 1603 04/12/18 0803  AST 68* 32 36*  ALT 72* 26 36*  BILITOT 0.5 0.7 0.6  PROT 6.0* 6.2 5.6*   No results for input(s): LIPASE, AMYLASE in the last 8760 hours. No results for input(s): AMMONIA in the last 8760 hours. CBC: Recent Labs    05/23/17 0810 09/08/17 1625 09/16/17 0939  WBC 6.0  --  7.0  NEUTROABS 3,402  --  4,158  HGB 12.1 11.9* 11.7  HCT 34.8* 35.0* 35.3  MCV 93.8  --  94.1  PLT 191  --  262   Lipid Panel: Recent Labs    05/23/17 0810 04/12/18 0803  CHOL 146 117  HDL 52 42*  LDLCALC 79 58  TRIG 72 89  CHOLHDL 2.8 2.8   TSH: Recent Labs    05/23/17 0810  TSH 1.50   A1C: Lab Results  Component Value Date   HGBA1C 5.5 08/28/2012     Assessment/Plan 1. Bilateral lower extremity edema -had improved but has since stopped lasix. Will restart at this time and monitor.  - furosemide (LASIX) 20 MG tablet; Take 1 tablet (20 mg total) by mouth daily.  Dispense: 30 tablet; Refill: 6  2. Chronic  obstructive pulmonary disease, unspecified COPD type ([place]) -ongoing shortness of breath. COPD is not controlled. She has NOT been taking symbicort or spiriva- refill provided today.  -cost is an issue. -using albuterol twice daily routinely to control symptoms.  - Ambulatory referral to Pulmonology - budesonide-formoterol (SYMBICORT) 160-4.5 MCG/ACT inhaler; Inhale 2 puffs into the lungs 2 (two) times daily.  Dispense: 1 Inhaler; Refill: 3 - tiotropium (SPIRIVA) 18 MCG inhalation capsule; Place 1 capsule (18 mcg total) into inhaler and inhale daily.  Dispense: 30 capsule; Refill: 12  3. Single subsegmental pulmonary embolism without acute cor pulmonale -continues on eliquis.   4. PAF (paroxysmal atrial fibrillation) (HCC) Rate controlled. Continues on eliquis for anticoagulation.   5. Elevated LFTs -stable, will continue to monitor.   6. Laryngeal dystonia Stable, continues with injections into vocal cord.   7. S/P  aortic valve replacement with bioprosthetic valve Stable, continues with yearly follow up from cardiology  8. CKD (chronic kidney disease) stage 3, GFR 30-59 ml/min (HCC) -Encourage proper hydration and to avoid NSAIDS (Aleve, Advil, Motrin, Ibuprofen)   9. Overactive bladder Stable, continue myrbetriq   Next appt: 2 weeks for swelling and COPD.  Carlos American. [place], [facility] [phone]

## 2018-04-19 NOTE — Patient Instructions (Signed)
Refill provided for symbicort and spririva At the lease get symbicort and restart this twice daily Continue albuterol AS NEEDED for cough/shortness of breath/wheezing  Pulmonary referral has been place for further evaluation of breathing.   Restart lasix 20 mg daily  Follow up in 2 weeks

## 2018-04-28 ENCOUNTER — Other Ambulatory Visit: Payer: Self-pay | Admitting: Nurse Practitioner

## 2018-05-03 ENCOUNTER — Encounter: Payer: Self-pay | Admitting: Nurse Practitioner

## 2018-05-03 ENCOUNTER — Ambulatory Visit (INDEPENDENT_AMBULATORY_CARE_PROVIDER_SITE_OTHER): Payer: Medicare Other | Admitting: Nurse Practitioner

## 2018-05-03 VITALS — BP 146/90 | HR 69 | Temp 97.8°F | Ht 63.0 in | Wt 153.6 lb

## 2018-05-03 DIAGNOSIS — R600 Localized edema: Secondary | ICD-10-CM

## 2018-05-03 DIAGNOSIS — J449 Chronic obstructive pulmonary disease, unspecified: Secondary | ICD-10-CM

## 2018-05-03 DIAGNOSIS — I10 Essential (primary) hypertension: Secondary | ICD-10-CM

## 2018-05-03 MED ORDER — LOSARTAN POTASSIUM 50 MG PO TABS: 50.0000 mg | ORAL_TABLET | Freq: Every day | ORAL | 1 refills | Status: DC

## 2018-05-03 NOTE — Progress Notes (Signed)
Careteam: Patient Care Team: Lauree Chandler, NP as PCP - General (Geriatric Medicine) Druscilla Brownie, MD as Consulting Physician (Dermatology) [name] Irani, MD (Inactive) as Consulting Physician (Gastroenterology) Neldon Mc, MD as Consulting Physician (General Surgery) Lorretta Harp, MD as Consulting Physician (Cardiology) Bjorn Loser, MD as Consulting Physician (Urology) Lavell Anchors, MD as Consulting Physician (Ophthalmology)  Advanced Directive information    Allergies  Allergen Reactions  . Lisinopril Cough  . Codeine Rash    All over the body.    Chief Complaint  Patient presents with  . Medical Management of Chronic Issues    Patient returns to the office today. She says her cough is better. She says she is taking a vitamin  C but unsure of the dose. Advised patient to call back with dose.,     HPI: Patient is a 85 y.o. female seen in the office today for follow up on cough.  At last OV pt with worsening cough and congestion had not been taking symbicort or spiriva due to the cough and had been using albuterol for maintenance. Since last OV she picked up both inhalers and using symbicort twice daily and spiriva daily. She reports cough and breathing has improved but still remains short of breath at times. Pulmonary referral has been placed.  She has not needed albuterol since she started symbicort and spiriva.   Lower leg edema- restarted lasix after last OV due to worsen LE edema. Reports this has now improved.    Review of Systems:  Review of Systems  Constitutional: Negative for chills, fever and weight loss.  HENT: Negative for tinnitus.   Respiratory: Positive for shortness of breath (overall improved). Negative for cough and sputum production.   Cardiovascular: Positive for leg swelling (improved). Negative for chest pain and palpitations.  Skin: Negative.   Neurological: Negative for dizziness and headaches.    Psychiatric/Behavioral: Negative for memory loss. The patient does not have insomnia.     Past Medical History:  Diagnosis Date  . Aortic stenosis 07/20/2012   Aortic stenosis   . Aortic valve disorder 08/13/2011   ECHO - EF >24%; mild diastolic dysfunction; calcified aortic valve, not well visualized; mod/severe aortic stenosis w/ worsening gradients when compared to 2012  . Arthritis   . Cancer ([place])    Breast  . Carotid bruit 08/06/2008   Doppler - R ECA demonstrates noarrowing w/ elevated velocities consistent w/ >70% diameter reduction; R and L ICAs show no evidence of diameter reduction, significant tortuosity or vascular abnormality;   . Change in voice   . COPD (chronic obstructive pulmonary disease) ([place])   . Hearing loss   . Heart murmur   . Hyperlipidemia   . Hypertension    dr Gwenlyn Found  . Osteoporosis   . PAF (paroxysmal atrial fibrillation) ([place]) 09/04/2012  . PVD (peripheral vascular disease) ([place]) 08/13/2010   R/P MV - normal pattern of perfusion in all regions, EF 76%; no significant wall abnormalities noted; normal perfusion study  . Right carotid bruit    high-grade right external carotid artery stenosis by 2 parts ultrasound 2 years ago  . S/P aortic valve replacement with bioprosthetic valve 08/30/2012   21 mm [facility] Ease bovine pericardial tissue valve   Past Surgical History:  Procedure Laterality Date  . ABDOMINAL HYSTERECTOMY     Partial  . AORTIC VALVE REPLACEMENT N/A 08/30/2012   Procedure: AORTIC VALVE REPLACEMENT (AVR);  Surgeon: Rexene Alberts, MD;  Location: [place];  Service:  Open Heart Surgery;  Laterality: N/A;  . BIOPSY SHOULDER Left 10/08/2005   shave biopsy  . BREAST LUMPECTOMY  02/25/1997   right  . CARDIAC CATHETERIZATION    . [place]  . COSMETIC SURGERY Left 1997   Breast implant  . EYE SURGERY  1997   Cataract surgery  . INTRAOPERATIVE TRANSESOPHAGEAL ECHOCARDIOGRAM N/A 08/30/2012   Procedure: INTRAOPERATIVE TRANSESOPHAGEAL  ECHOCARDIOGRAM;  Surgeon: Rexene Alberts, MD;  Location: [place];  Service: Open Heart Surgery;  Laterality: N/A;  . LEFT AND RIGHT HEART CATHETERIZATION WITH CORONARY ANGIOGRAM N/A 08/14/2012   Procedure: LEFT AND RIGHT HEART CATHETERIZATION WITH CORONARY ANGIOGRAM;  Surgeon: Lorretta Harp, MD;  Location[facility] C[facility] LAB;  Service: Cardiovascular;  Laterality: N/A;  . LEFT HEART CATH  08/14/12   Nl cors, AS  . MASTECTOMY  09/29/95   left  . PARTIAL HYSTERECTOMY    . TOTAL HIP ARTHROPLASTY  09/2010   Social History:   reports that she has never smoked. She has never used smokeless tobacco. She reports that she does not drink alcohol or use drugs.  Family History  Problem Relation Age of Onset  . Cancer Mother        Bladder  . Early death Father        Tractor accident  . Early death Brother        Radiation protection practitioner  . Early death Brother        during heart surgery    Medications: Patient's Medications  New Prescriptions   No medications on file  Previous Medications   ACETAMINOPHEN (TYLENOL) 325 MG TABLET    Take 2 tablets (650 mg total) by mouth every 6 (six) hours as needed.   ALBUTEROL (PROAIR HFA) 108 (90 BASE) MCG/ACT INHALER    INHALE 1 PUFF INTO THE LUNGS EVERY 6 (SIX) HOURS AS NEEDED FOR WHEEZING OR SHORTNESS OF BREATH.   ASCORBIC ACID (VITAMIN C) 100 MG TABLET    Take 100 mg by mouth daily.   BIOTIN 5000 MCG CAPS    Take 1 capsule by mouth daily.   BUDESONIDE-FORMOTEROL (SYMBICORT) 160-4.5 MCG/ACT INHALER    Inhale 2 puffs into the lungs 2 (two) times daily.   CALCIUM CITRATE-VITAMIN D (CITRACAL + D PO)    Take 1 tablet by mouth 2 (two) times daily.    [employer] LORATADINE 10 MG TABLET    TAKE 1 TABLET (10 MG TOTAL) BY MOUTH DAILY.   ELIQUIS 2.5 MG TABS TABLET    TAKE 1 TABLET BY MOUTH TWICE A DAY   FLUTICASONE (FLONASE) 50 MCG/ACT NASAL SPRAY    Place 1 spray into both nostrils 2 (two) times daily.   FUROSEMIDE (LASIX) 20 MG TABLET    Take 1 tablet (20 mg total) by mouth daily.    HYDROXYZINE (ATARAX/VISTARIL) 25 MG TABLET    TAKE 1 TABLET BY MOUTH AS NEEDED   LOSARTAN (COZAAR) 25 MG TABLET    TAKE 1 TABLET (25 MG TOTAL) BY MOUTH DAILY. TAKE PLACE OF VALSARTAN   MAGNESIUM HYDROXIDE (PHILLIPS MILK OF MAGNESIA PO)    Take 15 mL by mouth. Take daily at 6 o'clock at night   METOPROLOL TARTRATE (LOPRESSOR) 25 MG TABLET    TAKE 1 TABLET (25 MG TOTAL) BY MOUTH 2 (TWO) TIMES DAILY.   MIRABEGRON ER (MYRBETRIQ) 50 MG TB24 TABLET    Take 50 mg by mouth daily.   OMEPRAZOLE (PRILOSEC) 20 MG CAPSULE    TAKE 2 CAPSULES BY  MOUTH DAILY FOR REFLUX   SHARK LIVER OIL-COCOA BUTTER (PREPARATION H) 0.25-3-85.5 % SUPPOSITORY    Place 1 suppository rectally as needed.   SODIUM CHLORIDE (OCEAN) 0.65 % SOLN NASAL SPRAY    Place 1 spray into both nostrils as needed for congestion.   TIOTROPIUM (SPIRIVA) 18 MCG INHALATION CAPSULE    Place 1 capsule (18 mcg total) into inhaler and inhale daily.   VITAMIN E 400 UNIT CAPSULE    Take 400 Units by mouth daily.  Modified Medications   No medications on file  Discontinued Medications   No medications on file     Physical Exam:  Vitals:   05/03/18 1546  BP: (!) 146/90  Pulse: 69  Temp: 97.8 F (36.6 C)  SpO2: 97%  Weight: 153 lb 9.6 oz (69.7 kg)  Height: 5' 3"  (1.6 m)   Body mass index is 27.21 kg/m.  Physical Exam Constitutional:      Appearance: Normal appearance. She is well-developed.  HENT:     Mouth/Throat:     Pharynx: No oropharyngeal exudate.  Eyes:     General: No scleral icterus.    Pupils: Pupils are equal, round, and reactive to light.  Neck:     Musculoskeletal: Neck supple.     Thyroid: No thyromegaly.     Vascular: No carotid bruit.     Trachea: No tracheal deviation.  Cardiovascular:     Rate and Rhythm: Normal rate. Rhythm irregularly irregular.     Heart sounds: Murmur (3/6 SEM) present. No friction rub. No gallop.   Pulmonary:     Effort: Pulmonary effort is normal. No respiratory distress.     Breath sounds:  No stridor. Decreased breath sounds and rales present. No wheezing.  Chest:     Chest wall: No tenderness.  Abdominal:     General: Bowel sounds are normal. There is no distension.     Palpations: Abdomen is soft. Abdomen is not rigid. There is no hepatomegaly or mass.     Tenderness: There is no abdominal tenderness. There is no guarding or rebound.     Hernia: No hernia is present.     Comments: obese  Musculoskeletal:     Right lower leg: Edema present.     Left lower leg: Edema present.     Comments: Pitting edema bilaterally  Lymphadenopathy:     Cervical: No cervical adenopathy.  Skin:    General: Skin is warm and dry.     Findings: No rash.  Neurological:     Mental Status: She is alert and oriented to person, place, and time.     Deep Tendon Reflexes: Reflexes are normal and symmetric.  Psychiatric:        Behavior: Behavior normal.        Thought Content: Thought content normal.        Judgment: Judgment normal.     Comments: Voice is hoarse     Labs reviewed: Basic Metabolic Panel: Recent Labs    05/23/17 0810  09/16/17 0939 11/08/17 1603 04/12/18 0803  NA 142   < > 137 137 142  K 4.5   < > 5.2 4.8 5.0  CL 104   < > 103 102 105  CO2 31  --  28 30 30   GLUCOSE 97   < > 108 95 91  BUN 15   < > 16 12 17   CREATININE 1.14*   < > 1.11* 1.14* 1.30*  CALCIUM 9.7  --  10.0 10.3  9.9  TSH 1.50  --   --   --   --    < > = values in this interval not displayed.   Liver Function Tests: Recent Labs    09/16/17 0939 11/08/17 1603 04/12/18 0803  AST 68* 32 36*  ALT 72* 26 36*  BILITOT 0.5 0.7 0.6  PROT 6.0* 6.2 5.6*   No results for input(s): LIPASE, AMYLASE in the last 8760 hours. No results for input(s): AMMONIA in the last 8760 hours. CBC: Recent Labs    05/23/17 0810 09/08/17 1625 09/16/17 0939  WBC 6.0  --  7.0  NEUTROABS 3,402  --  4,158  HGB 12.1 11.9* 11.7  HCT 34.8* 35.0* 35.3  MCV 93.8  --  94.1  PLT 191  --  262   Lipid Panel: Recent Labs      05/23/17 0810 04/12/18 0803  CHOL 146 117  HDL 52 42*  LDLCALC 79 58  TRIG 72 89  CHOLHDL 2.8 2.8   TSH: Recent Labs    05/23/17 0810  TSH 1.50   A1C: Lab Results  Component Value Date   HGBA1C 5.5 08/28/2012     Assessment/Plan 1. Bilateral lower extremity edema -improved since using lasix, will continue at this time. Encouraged compression hose with elevation of LE. Also low sodium diet.  - BMP with eGFR([facility])  2. Chronic obstructive pulmonary disease, unspecified COPD type (HCC) -cough and congestion has improved with adding spiriva and symbicort. She reports she is still having trouble with shortness of breat at times however has not needed albuterol. Pulmonary referral is pending.    3. Essential hypertension Remains elevated on recheck. States blood pressure is about the same at home.  DASH diet encouraged and will increase losartan to 50 mg by mouth daily  - losartan (COZAAR) 50 MG tablet; Take 1 tablet (50 mg total) by mouth daily. Take place of valsartan  Dispense: 30 tablet; Refill: 1  Next appt: 05/26/2018 for blood pressure recheck and AWV  Jessica K. [place], [facility] [phone]

## 2018-05-03 NOTE — Patient Instructions (Addendum)
Continue medication as prescribed.   Increase losartan to 50 mg by mouth daily for blood pressure.   DASH Eating Plan DASH stands for "Dietary Approaches to Stop Hypertension." The DASH eating plan is a healthy eating plan that has been shown to reduce high blood pressure (hypertension). It may also reduce your risk for type 2 diabetes, heart disease, and stroke. The DASH eating plan may also help with weight loss. What are tips for following this plan?  General guidelines  Avoid eating more than 2,300 mg (milligrams) of salt (sodium) a day. If you have hypertension, you may need to reduce your sodium intake to 1,500 mg a day.  Limit alcohol intake to no more than 1 drink a day for nonpregnant women and 2 drinks a day for men. One drink equals 12 oz of beer, 5 oz of wine, or 1 oz of hard liquor.  Work with your health care provider to maintain a healthy body weight or to lose weight. Ask what an ideal weight is for you.  Get at least 30 minutes of exercise that causes your heart to beat faster (aerobic exercise) most days of the week. Activities may include walking, swimming, or biking.  Work with your health care provider or diet and nutrition specialist (dietitian) to adjust your eating plan to your individual calorie needs. Reading food labels   Check food labels for the amount of sodium per serving. Choose foods with less than 5 percent of the Daily Value of sodium. Generally, foods with less than 300 mg of sodium per serving fit into this eating plan.  To find whole grains, look for the word "whole" as the first word in the ingredient list. Shopping  Buy products labeled as "low-sodium" or "no salt added."  Buy fresh foods. Avoid canned foods and premade or frozen meals. Cooking  Avoid adding salt when cooking. Use salt-free seasonings or herbs instead of table salt or sea salt. Check with your health care provider or pharmacist before using salt substitutes.  Do not fry  foods. Cook foods using healthy methods such as baking, boiling, grilling, and broiling instead.  Cook with heart-healthy oils, such as olive, canola, soybean, or sunflower oil. Meal planning  Eat a balanced diet that includes: ? 5 or more servings of fruits and vegetables each day. At each meal, try to fill half of your plate with fruits and vegetables. ? Up to 6-8 servings of whole grains each day. ? Less than 6 oz of lean meat, poultry, or fish each day. A 3-oz serving of meat is about the same size as a deck of cards. One egg equals 1 oz. ? 2 servings of low-fat dairy each day. ? A serving of nuts, seeds, or beans 5 times each week. ? Heart-healthy fats. Healthy fats called Omega-3 fatty acids are found in foods such as flaxseeds and coldwater fish, like sardines, salmon, and mackerel.  Limit how much you eat of the following: ? Canned or prepackaged foods. ? Food that is high in trans fat, such as fried foods. ? Food that is high in saturated fat, such as fatty meat. ? Sweets, desserts, sugary drinks, and other foods with added sugar. ? Full-fat dairy products.  Do not salt foods before eating.  Try to eat at least 2 vegetarian meals each week.  Eat more home-cooked food and less restaurant, buffet, and fast food.  When eating at a restaurant, ask that your food be prepared with less salt or no salt, if  possible. What foods are recommended? The items listed may not be a complete list. Talk with your dietitian about what dietary choices are best for you. Grains Whole-grain or whole-wheat bread. Whole-grain or whole-wheat pasta. Brown rice. [name]. Bulgur. Whole-grain and low-sodium cereals. Pita bread. Low-fat, low-sodium crackers. Whole-wheat flour tortillas. Vegetables Fresh or frozen vegetables (raw, steamed, roasted, or grilled). Low-sodium or reduced-sodium tomato and vegetable juice. Low-sodium or reduced-sodium tomato sauce and tomato paste. Low-sodium or  reduced-sodium canned vegetables. Fruits All fresh, dried, or frozen fruit. Canned fruit in natural juice (without added sugar). Meat and other protein foods Skinless chicken or [place]. Ground chicken or [place]. Pork with fat trimmed off. Fish and seafood. Egg whites. Dried beans, peas, or lentils. Unsalted nuts, nut butters, and seeds. Unsalted canned beans. Lean cuts of beef with fat trimmed off. Low-sodium, lean deli meat. Dairy Low-fat (1%) or fat-free (skim) milk. Fat-free, low-fat, or reduced-fat cheeses. Nonfat, low-sodium ricotta or cottage cheese. Low-fat or nonfat yogurt. Low-fat, low-sodium cheese. Fats and oils Soft margarine without trans fats. Vegetable oil. Low-fat, reduced-fat, or light mayonnaise and salad dressings (reduced-sodium). Canola, safflower, olive, soybean, and sunflower oils. Avocado. Seasoning and other foods Herbs. Spices. Seasoning mixes without salt. Unsalted popcorn and pretzels. Fat-free sweets. What foods are not recommended? The items listed may not be a complete list. Talk with your dietitian about what dietary choices are best for you. Grains Baked goods made with fat, such as croissants, muffins, or some breads. Dry pasta or rice meal packs. Vegetables Creamed or fried vegetables. Vegetables in a cheese sauce. Regular canned vegetables (not low-sodium or reduced-sodium). Regular canned tomato sauce and paste (not low-sodium or reduced-sodium). Regular tomato and vegetable juice (not low-sodium or reduced-sodium). [name]. Olives. Fruits Canned fruit in a light or heavy syrup. Fried fruit. Fruit in cream or butter sauce. Meat and other protein foods Fatty cuts of meat. Ribs. Fried meat. [name]. Sausage. Bologna and other processed lunch meats. Salami. Fatback. Hotdogs. Bratwurst. Salted nuts and seeds. Canned beans with added salt. Canned or smoked fish. Whole eggs or egg yolks. Chicken or [place] with skin. Dairy Whole or 2% milk, cream, and half-and-half.  Whole or full-fat cream cheese. Whole-fat or sweetened yogurt. Full-fat cheese. Nondairy creamers. Whipped toppings. Processed cheese and cheese spreads. Fats and oils Butter. Stick margarine. Lard. Shortening. Ghee. Bacon fat. Tropical oils, such as coconut, palm kernel, or palm oil. Seasoning and other foods Salted popcorn and pretzels. Onion salt, garlic salt, seasoned salt, table salt, and sea salt. Worcestershire sauce. Tartar sauce. Barbecue sauce. Teriyaki sauce. Soy sauce, including reduced-sodium. Steak sauce. Canned and packaged gravies. Fish sauce. Oyster sauce. Cocktail sauce. Horseradish that you find on the shelf. Ketchup. Mustard. Meat flavorings and tenderizers. Bouillon cubes. Hot sauce and Tabasco sauce. Premade or packaged marinades. Premade or packaged taco seasonings. Relishes. Regular salad dressings. Where to find more information:  National Heart, Lung, and [place]: https://wilson-eaton.com/  American Heart Association: www.heart.org Summary  The DASH eating plan is a healthy eating plan that has been shown to reduce high blood pressure (hypertension). It may also reduce your risk for type 2 diabetes, heart disease, and stroke.  With the DASH eating plan, you should limit salt (sodium) intake to 2,300 mg a day. If you have hypertension, you may need to reduce your sodium intake to 1,500 mg a day.  When on the DASH eating plan, aim to eat more fresh fruits and vegetables, whole grains, lean proteins, low-fat dairy, and heart-healthy fats.  Work with your health care provider or diet and nutrition specialist (dietitian) to adjust your eating plan to your individual calorie needs. This information is not intended to replace advice given to you by your health care provider. Make sure you discuss any questions you have with your health care provider. Document Released: 02/04/2011 Document Revised: 02/09/2016 Document Reviewed: 02/09/2016 Elsevier Interactive Patient Education   2019 [employer].

## 2018-05-04 ENCOUNTER — Telehealth: Payer: Self-pay | Admitting: *Deleted

## 2018-05-04 LAB — BASIC METABOLIC PANEL WITH GFR
BUN/Creatinine Ratio: 20 (calc) (ref 6–22)
BUN: 25 mg/dL (ref 7–25)
CO2: 31 mmol/L (ref 20–32)
Calcium: 10.2 mg/dL (ref 8.6–10.4)
Chloride: 103 mmol/L (ref 98–110)
Creat: 1.22 mg/dL — ABNORMAL HIGH (ref 0.60–0.88)
GFR, Est African American: 47 mL/min/{1.73_m2} — ABNORMAL LOW (ref >=60)
GFR, Est Non African American: 40 mL/min/{1.73_m2} — ABNORMAL LOW (ref >=60)
Glucose, Bld: 91 mg/dL (ref 65–139)
Potassium: 4.8 mmol/L (ref 3.5–5.3)
Sodium: 141 mmol/L (ref 135–146)

## 2018-05-04 NOTE — Telephone Encounter (Signed)
Pt daughter WOULD NOT tell me what the phone call was about and only wanted to speak to anita about pt's visit yesterday.

## 2018-05-04 NOTE — Telephone Encounter (Signed)
Called daughter, [name]. She just wanted to confirm increase in Losartan. Stated that she was going to also call Dr. Kennon Holter office to make them aware of the change.

## 2018-05-04 NOTE — Telephone Encounter (Signed)
Okay thank you

## 2018-05-10 DIAGNOSIS — R350 Frequency of micturition: Secondary | ICD-10-CM | POA: Diagnosis not present

## 2018-05-10 DIAGNOSIS — N3946 Mixed incontinence: Secondary | ICD-10-CM | POA: Diagnosis not present

## 2018-05-17 ENCOUNTER — Ambulatory Visit: Payer: Medicare Other | Admitting: Nurse Practitioner

## 2018-05-23 ENCOUNTER — Other Ambulatory Visit: Payer: Self-pay

## 2018-05-23 ENCOUNTER — Other Ambulatory Visit: Payer: Self-pay | Admitting: Cardiovascular Disease

## 2018-05-23 MED ORDER — LOSARTAN POTASSIUM 25 MG PO TABS: 50.0000 mg | ORAL_TABLET | Freq: Every day | ORAL | 0 refills | Status: DC

## 2018-05-23 NOTE — Telephone Encounter (Signed)
Pharmacy sent documentation letting provider know Losartan 50 mg is on back order. Provider is aware and stated to change to Losartan 25 mg tab now. Patient will take 2 (25mg ) tablets to equal 50mg .   High allergy alert - routing to provider to approve.

## 2018-05-26 ENCOUNTER — Encounter: Payer: Medicare Other | Admitting: Family

## 2018-05-26 ENCOUNTER — Ambulatory Visit: Payer: 59

## 2018-05-26 ENCOUNTER — Ambulatory Visit: Payer: Medicare Other | Admitting: Nurse Practitioner

## 2018-05-29 ENCOUNTER — Ambulatory Visit: Payer: Self-pay | Admitting: Nurse Practitioner

## 2018-05-31 ENCOUNTER — Ambulatory Visit (INDEPENDENT_AMBULATORY_CARE_PROVIDER_SITE_OTHER): Payer: Medicare Other | Admitting: Nurse Practitioner

## 2018-05-31 ENCOUNTER — Encounter: Payer: Self-pay | Admitting: Nurse Practitioner

## 2018-05-31 ENCOUNTER — Other Ambulatory Visit: Payer: Self-pay

## 2018-05-31 DIAGNOSIS — Z1231 Encounter for screening mammogram for malignant neoplasm of breast: Secondary | ICD-10-CM

## 2018-05-31 DIAGNOSIS — Z0000 Encounter for general adult medical examination without abnormal findings: Secondary | ICD-10-CM | POA: Diagnosis not present

## 2018-05-31 DIAGNOSIS — I10 Essential (primary) hypertension: Secondary | ICD-10-CM | POA: Diagnosis not present

## 2018-05-31 DIAGNOSIS — Z79899 Other long term (current) drug therapy: Secondary | ICD-10-CM | POA: Diagnosis not present

## 2018-05-31 DIAGNOSIS — E2839 Other primary ovarian failure: Secondary | ICD-10-CM | POA: Diagnosis not present

## 2018-05-31 NOTE — Patient Instructions (Signed)
Continue losartan 50 mg by mouth daily with lopressor 25 mg by mouth twice daily for blood pressure   DASH Eating Plan DASH stands for "Dietary Approaches to Stop Hypertension." The DASH eating plan is a healthy eating plan that has been shown to reduce high blood pressure (hypertension). It may also reduce your risk for type 2 diabetes, heart disease, and stroke. The DASH eating plan may also help with weight loss. What are tips for following this plan?  General guidelines  Avoid eating more than 2,300 mg (milligrams) of salt (sodium) a day. If you have hypertension, you may need to reduce your sodium intake to 1,500 mg a day.  Limit alcohol intake to no more than 1 drink a day for nonpregnant women and 2 drinks a day for men. One drink equals 12 oz of beer, 5 oz of wine, or 1 oz of hard liquor.  Work with your health care provider to maintain a healthy body weight or to lose weight. Ask what an ideal weight is for you.  Get at least 30 minutes of exercise that causes your heart to beat faster (aerobic exercise) most days of the week. Activities may include walking, swimming, or biking.  Work with your health care provider or diet and nutrition specialist (dietitian) to adjust your eating plan to your individual calorie needs. Reading food labels   Check food labels for the amount of sodium per serving. Choose foods with less than 5 percent of the Daily Value of sodium. Generally, foods with less than 300 mg of sodium per serving fit into this eating plan.  To find whole grains, look for the word "whole" as the first word in the ingredient list. Shopping  Buy products labeled as "low-sodium" or "no salt added."  Buy fresh foods. Avoid canned foods and premade or frozen meals. Cooking  Avoid adding salt when cooking. Use salt-free seasonings or herbs instead of table salt or sea salt. Check with your health care provider or pharmacist before using salt substitutes.  Do not fry  foods. Cook foods using healthy methods such as baking, boiling, grilling, and broiling instead.  Cook with heart-healthy oils, such as olive, canola, soybean, or sunflower oil. Meal planning  Eat a balanced diet that includes: ? 5 or more servings of fruits and vegetables each day. At each meal, try to fill half of your plate with fruits and vegetables. ? Up to 6-8 servings of whole grains each day. ? Less than 6 oz of lean meat, poultry, or fish each day. A 3-oz serving of meat is about the same size as a deck of cards. One egg equals 1 oz. ? 2 servings of low-fat dairy each day. ? A serving of nuts, seeds, or beans 5 times each week. ? Heart-healthy fats. Healthy fats called Omega-3 fatty acids are found in foods such as flaxseeds and coldwater fish, like sardines, salmon, and mackerel.  Limit how much you eat of the following: ? Canned or prepackaged foods. ? Food that is high in trans fat, such as fried foods. ? Food that is high in saturated fat, such as fatty meat. ? Sweets, desserts, sugary drinks, and other foods with added sugar. ? Full-fat dairy products.  Do not salt foods before eating.  Try to eat at least 2 vegetarian meals each week.  Eat more home-cooked food and less restaurant, buffet, and fast food.  When eating at a restaurant, ask that your food be prepared with less salt or no salt,  if possible. What foods are recommended? The items listed may not be a complete list. Talk with your dietitian about what dietary choices are best for you. Grains Whole-grain or whole-wheat bread. Whole-grain or whole-wheat pasta. Brown rice. [name]. Bulgur. Whole-grain and low-sodium cereals. Pita bread. Low-fat, low-sodium crackers. Whole-wheat flour tortillas. Vegetables Fresh or frozen vegetables (raw, steamed, roasted, or grilled). Low-sodium or reduced-sodium tomato and vegetable juice. Low-sodium or reduced-sodium tomato sauce and tomato paste. Low-sodium or  reduced-sodium canned vegetables. Fruits All fresh, dried, or frozen fruit. Canned fruit in natural juice (without added sugar). Meat and other protein foods Skinless chicken or [place]. Ground chicken or [place]. Pork with fat trimmed off. Fish and seafood. Egg whites. Dried beans, peas, or lentils. Unsalted nuts, nut butters, and seeds. Unsalted canned beans. Lean cuts of beef with fat trimmed off. Low-sodium, lean deli meat. Dairy Low-fat (1%) or fat-free (skim) milk. Fat-free, low-fat, or reduced-fat cheeses. Nonfat, low-sodium ricotta or cottage cheese. Low-fat or nonfat yogurt. Low-fat, low-sodium cheese. Fats and oils Soft margarine without trans fats. Vegetable oil. Low-fat, reduced-fat, or light mayonnaise and salad dressings (reduced-sodium). Canola, safflower, olive, soybean, and sunflower oils. Avocado. Seasoning and other foods Herbs. Spices. Seasoning mixes without salt. Unsalted popcorn and pretzels. Fat-free sweets. What foods are not recommended? The items listed may not be a complete list. Talk with your dietitian about what dietary choices are best for you. Grains Baked goods made with fat, such as croissants, muffins, or some breads. Dry pasta or rice meal packs. Vegetables Creamed or fried vegetables. Vegetables in a cheese sauce. Regular canned vegetables (not low-sodium or reduced-sodium). Regular canned tomato sauce and paste (not low-sodium or reduced-sodium). Regular tomato and vegetable juice (not low-sodium or reduced-sodium). [name]. Olives. Fruits Canned fruit in a light or heavy syrup. Fried fruit. Fruit in cream or butter sauce. Meat and other protein foods Fatty cuts of meat. Ribs. Fried meat. [name]. Sausage. Bologna and other processed lunch meats. Salami. Fatback. Hotdogs. Bratwurst. Salted nuts and seeds. Canned beans with added salt. Canned or smoked fish. Whole eggs or egg yolks. Chicken or [place] with skin. Dairy Whole or 2% milk, cream, and half-and-half.  Whole or full-fat cream cheese. Whole-fat or sweetened yogurt. Full-fat cheese. Nondairy creamers. Whipped toppings. Processed cheese and cheese spreads. Fats and oils Butter. Stick margarine. Lard. Shortening. Ghee. Bacon fat. Tropical oils, such as coconut, palm kernel, or palm oil. Seasoning and other foods Salted popcorn and pretzels. Onion salt, garlic salt, seasoned salt, table salt, and sea salt. Worcestershire sauce. Tartar sauce. Barbecue sauce. Teriyaki sauce. Soy sauce, including reduced-sodium. Steak sauce. Canned and packaged gravies. Fish sauce. Oyster sauce. Cocktail sauce. Horseradish that you find on the shelf. Ketchup. Mustard. Meat flavorings and tenderizers. Bouillon cubes. Hot sauce and Tabasco sauce. Premade or packaged marinades. Premade or packaged taco seasonings. Relishes. Regular salad dressings. Where to find more information:  National Heart, Lung, and [place]: https://wilson-eaton.com/  American Heart Association: www.heart.org Summary  The DASH eating plan is a healthy eating plan that has been shown to reduce high blood pressure (hypertension). It may also reduce your risk for type 2 diabetes, heart disease, and stroke.  With the DASH eating plan, you should limit salt (sodium) intake to 2,300 mg a day. If you have hypertension, you may need to reduce your sodium intake to 1,500 mg a day.  When on the DASH eating plan, aim to eat more fresh fruits and vegetables, whole grains, lean proteins, low-fat dairy, and heart-healthy fats.  Work with your health care provider or diet and nutrition specialist (dietitian) to adjust your eating plan to your individual calorie needs. This information is not intended to replace advice given to you by your health care provider. Make sure you discuss any questions you have with your health care provider. Document Released: 02/04/2011 Document Revised: 02/09/2016 Document Reviewed: 02/09/2016 Elsevier Interactive Patient Education   2019 [employer].

## 2018-05-31 NOTE — Progress Notes (Signed)
Subjective:   [name] is a 85 y.o. female who presents for Medicare Annual (Subsequent) preventive examination.  Review of Systems:   Cardiac Risk Factors include: advanced age (>86men, >32 women);family history of premature cardiovascular disease;hypertension;dyslipidemia     Objective:     Vitals: There were no vitals taken for this visit.  There is no height or weight on file to calculate BMI.  Advanced Directives 05/31/2018 04/19/2018 11/08/2017 07/26/2017 05/23/2017 09/08/2016 05/05/2016  Does Patient Have a Medical Advance Directive? No No No No No No No  Would patient like information on creating a medical advance directive? Yes (MAU/Ambulatory/Procedural Areas - Information given) Yes (MAU/Ambulatory/Procedural Areas - Information given) - - Yes (MAU/Ambulatory/Procedural Areas - Information given) No - Patient declined No - Patient declined  Pre-existing out of facility DNR order (yellow form or pink MOST form) - - - - - - -    Tobacco Social History   Tobacco Use  Smoking Status Never Smoker  Smokeless Tobacco Never Used     Counseling given: Not Answered   Clinical Intake:  Pre-visit preparation completed: Yes  Pain : No/denies pain     BMI - recorded: 27.21 Nutritional Status: BMI 25 -29 Overweight Nutritional Risks: None Diabetes: No  How often do you need to have someone help you when you read instructions, pamphlets, or other written materials from your doctor or pharmacy?: 3 - Sometimes What is the last grade level you completed in school?: 9th grade  Interpreter Needed?: No     Past Medical History:  Diagnosis Date  . Aortic stenosis 07/20/2012   Aortic stenosis   . Aortic valve disorder 08/13/2011   ECHO - EF >67%; mild diastolic dysfunction; calcified aortic valve, not well visualized; mod/severe aortic stenosis w/ worsening gradients when compared to 2012  . Arthritis   . Cancer ([place])    Breast  . Carotid bruit 08/06/2008   Doppler - R ECA  demonstrates noarrowing w/ elevated velocities consistent w/ >70% diameter reduction; R and L ICAs show no evidence of diameter reduction, significant tortuosity or vascular abnormality;   . Change in voice   . COPD (chronic obstructive pulmonary disease) ([place])   . Hearing loss   . Heart murmur   . Hyperlipidemia   . Hypertension    dr Gwenlyn Found  . Osteoporosis   . PAF (paroxysmal atrial fibrillation) ([place]) 09/04/2012  . PVD (peripheral vascular disease) ([place]) 08/13/2010   R/P MV - normal pattern of perfusion in all regions, EF 76%; no significant wall abnormalities noted; normal perfusion study  . Right carotid bruit    high-grade right external carotid artery stenosis by 2 parts ultrasound 2 years ago  . S/P aortic valve replacement with bioprosthetic valve 08/30/2012   21 mm [facility] Ease bovine pericardial tissue valve   Past Surgical History:  Procedure Laterality Date  . ABDOMINAL HYSTERECTOMY     Partial  . AORTIC VALVE REPLACEMENT N/A 08/30/2012   Procedure: AORTIC VALVE REPLACEMENT (AVR);  Surgeon: Rexene Alberts, MD;  Location: [facility];  Service: Open Heart Surgery;  Laterality: N/A;  . BIOPSY SHOULDER Left 10/08/2005   shave biopsy  . BREAST LUMPECTOMY  02/25/1997   right  . CARDIAC CATHETERIZATION    . [place]  . COSMETIC SURGERY Left 1997   Breast implant  . EYE SURGERY  1997   Cataract surgery  . INTRAOPERATIVE TRANSESOPHAGEAL ECHOCARDIOGRAM N/A 08/30/2012   Procedure: INTRAOPERATIVE TRANSESOPHAGEAL ECHOCARDIOGRAM;  Surgeon: Rexene Alberts,  MD;  Location: [facility] OR;  Service: Open Heart Surgery;  Laterality: N/A;  . LEFT AND RIGHT HEART CATHETERIZATION WITH CORONARY ANGIOGRAM N/A 08/14/2012   Procedure: LEFT AND RIGHT HEART CATHETERIZATION WITH CORONARY ANGIOGRAM;  Surgeon: Lorretta Harp, MD;  Location: [facility] CATH LAB;  Service: Cardiovascular;  Laterality: N/A;  . LEFT HEART CATH  08/14/12   Nl cors, AS  . MASTECTOMY  09/29/95   left  . PARTIAL HYSTERECTOMY    .  TOTAL HIP ARTHROPLASTY  09/2010   Family History  Problem Relation Age of Onset  . Cancer Mother        Bladder  . Early death Father        Tractor accident  . Early death Brother        Radiation protection practitioner  . Early death Brother        during heart surgery   Social History   Socioeconomic History  . Marital status: Widowed    Spouse name: Not on file  . Number of children: Not on file  . Years of education: Not on file  . Highest education level: Not on file  Occupational History  . Not on file  Social Needs  . Financial resource strain: Not hard at all  . Food insecurity:    Worry: Never true    Inability: Never true  . Transportation needs:    Medical: No    Non-medical: No  Tobacco Use  . Smoking status: Never Smoker  . Smokeless tobacco: Never Used  Substance and Sexual Activity  . Alcohol use: No  . Drug use: No  . Sexual activity: Never  Lifestyle  . Physical activity:    Days per week: 7 days    Minutes per session: 10 min  . Stress: Only a little  Relationships  . Social connections:    Talks on phone: More than three times a week    Gets together: More than three times a week    Attends religious service: More than 4 times per year    Active member of club or organization: No    Attends meetings of clubs or organizations: Never    Relationship status: Widowed  Other Topics Concern  . Not on file  Social History Narrative  . Not on file    Outpatient Encounter Medications as of 05/31/2018  Medication Sig  . acetaminophen (TYLENOL) 325 MG tablet Take 2 tablets (650 mg total) by mouth every 6 (six) hours as needed.  Marland Kitchen albuterol (PROAIR HFA) 108 (90 Base) MCG/ACT inhaler INHALE 1 PUFF INTO THE LUNGS EVERY 6 (SIX) HOURS AS NEEDED FOR WHEEZING OR SHORTNESS OF BREATH.  Marland Kitchen Ascorbic Acid (VITAMIN C) 100 MG tablet Take 100 mg by mouth daily.  . Biotin 5000 MCG CAPS Take 1 capsule by mouth daily.  . budesonide-formoterol (SYMBICORT) 160-4.5 MCG/ACT inhaler Inhale 2  puffs into the lungs 2 (two) times daily.  . Calcium Citrate-Vitamin D (CITRACAL + D PO) Take 1 tablet by mouth 2 (two) times daily.   . [facility] LORATADINE 10 MG tablet TAKE 1 TABLET (10 MG TOTAL) BY MOUTH DAILY.  Marland Kitchen ELIQUIS 2.5 MG TABS tablet TAKE 1 TABLET BY MOUTH TWICE A DAY  . fluticasone (FLONASE) 50 MCG/ACT nasal spray Place 1 spray into both nostrils 2 (two) times daily.  . furosemide (LASIX) 20 MG tablet Take 1 tablet (20 mg total) by mouth daily.  . hydrOXYzine (ATARAX/VISTARIL) 25 MG tablet TAKE 1 TABLET BY MOUTH AS NEEDED  .  losartan (COZAAR) 25 MG tablet Take 2 tablets (50 mg total) by mouth daily. Take 2 tabs to = 50 mg total  . Magnesium Hydroxide (PHILLIPS MILK OF MAGNESIA PO) Take 15 mL by mouth. Take daily at 6 o'clock at night  . metoprolol tartrate (LOPRESSOR) 25 MG tablet TAKE 1 TABLET (25 MG TOTAL) BY MOUTH 2 (TWO) TIMES DAILY.  . mirabegron ER (MYRBETRIQ) 50 MG TB24 tablet Take 50 mg by mouth daily.  Marland Kitchen omeprazole (PRILOSEC) 20 MG capsule TAKE 2 CAPSULES BY MOUTH DAILY FOR REFLUX  . shark liver oil-cocoa butter (PREPARATION H) 0.25-3-85.5 % suppository Place 1 suppository rectally as needed.  . sodium chloride (OCEAN) 0.65 % SOLN nasal spray Place 1 spray into both nostrils as needed for congestion.  Marland Kitchen tiotropium (SPIRIVA) 18 MCG inhalation capsule Place 1 capsule (18 mcg total) into inhaler and inhale daily.  . vitamin E 400 UNIT capsule Take 400 Units by mouth daily.   No facility-administered encounter medications on file as of 05/31/2018.     Activities of Daily Living In your present state of health, do you have any difficulty performing the following activities: 05/31/2018  Hearing? Y  Vision? N  Difficulty concentrating or making decisions? N  Walking or climbing stairs? Y  Comment due to OA  Dressing or bathing? N  Doing errands, shopping? N  Preparing Food and eating ? N  Using the Toilet? N  In the past six months, have you accidently leaked urine? Y  Comment  leaks when she cough, following with Urologist  Do you have problems with loss of bowel control? N  Managing your Medications? N  Managing your Finances? N  Housekeeping or managing your Housekeeping? N  Some recent data might be hidden    Patient Care Team: Lauree Chandler, NP as PCP - General (Geriatric Medicine) Druscilla Brownie, MD as Consulting Physician (Dermatology) [name] Irani, MD (Inactive) as Consulting Physician (Gastroenterology) Neldon Mc, MD as Consulting Physician (General Surgery) Lorretta Harp, MD as Consulting Physician (Cardiology) Bjorn Loser, MD as Consulting Physician (Urology) Lavell Anchors, MD as Consulting Physician (Ophthalmology)    Assessment:   This is a routine wellness examination for Wren.  Exercise Activities and Dietary recommendations Current Exercise Habits: Home exercise routine, Type of exercise: walking;stretching;strength training/weights, Time (Minutes): 30, Frequency (Times/Week): 4, Weekly Exercise (Minutes/Week): 120, Intensity: Moderate  Goals    . Increase water intake     Starting 02/04/16, I will attempt to increase my water from 3 bottles to 4-5 bottles per day.        Fall Risk Fall Risk  05/31/2018 05/03/2018 04/19/2018 12/20/2017 11/08/2017  Falls in the past year? 1 1 1  No No  Number falls in past yr: 0 0 0 - -  Injury with Fall? 0 - 0 - -   Is the patient's home free of loose throw rugs in walkways, pet beds, electrical cords, etc?   yes      Grab bars in the bathroom? yes      Handrails on the stairs?   yes      Adequate lighting?   yes  Timed Get Up and Go performed: na  Depression Screen PHQ 2/9 Scores 05/31/2018 08/09/2017 05/23/2017 02/04/2016  PHQ - 2 Score 0 0 1 0     Cognitive Function MMSE - Mini Mental State Exam 05/23/2017 02/04/2016 11/27/2014  Orientation to time 5 5 5   Orientation to Place 5 5 5   Registration 3 3 3   Attention/ Calculation  5 4 4   Recall 1 2 2   Language- name 2 objects 2  2 2   Language- repeat 1 1 1   Language- follow 3 step command 3 3 3   Language- read & follow direction 1 0 1  Write a sentence 1 0 1  Copy design 1 1 1   Total score 28 26 28      6CIT Screen 05/31/2018  What Year? 0 points  What month? 0 points  What time? 0 points  Count back from 20 0 points  Months in reverse 0 points  Repeat phrase 4 points  Total Score 4    Immunization History  Administered Date(s) Administered  . Influenza,inj,Quad PF,6+ Mos 11/29/2012, 11/27/2013, 11/27/2014, 12/09/2015, 01/11/2017, 12/20/2017  . Pneumococcal Conjugate-13 05/21/2014  . Pneumococcal Polysaccharide-23 01/11/2005, 08/15/2012  . Tdap 05/28/2014  . Zoster 01/14/2009  . Zoster Recombinat (Shingrix) 06/24/2017, 11/02/2017    Qualifies for Shingles Vaccine?yes, up to date  Screening Tests Health Maintenance  Topic Date Due  . INFLUENZA VACCINE  09/30/2018  . TETANUS/TDAP  05/27/2024  . DEXA SCAN  Completed  . PNA vac Low Risk Adult  Completed    Cancer Screenings: Lung: Low Dose CT Chest recommended if Age 54-80 years, 30 pack-year currently smoking OR have quit w/in 15years. Patient does not qualify. Breast:  Up to date on Mammogram? No   Up to date of Bone Density/Dexa? No Colorectal: up to date   Additional Screenings:  Hepatitis C Screening: na     Plan:    I have personally reviewed and noted the following in the patient's chart:   . Medical and social history . Use of alcohol, tobacco or illicit drugs  . Current medications and supplements . Functional ability and status . Nutritional status . Physical activity . Advanced directives . List of other physicians . upHospitalizations, surgeries, and ER visits in previous 12 months . Vitals . Screenings to include cognitive, depression, and falls . Referrals and appointments  In addition, I have reviewed and discussed with patient certain preventive protocols, quality metrics, and best practice recommendations. A  written personalized care plan for preventive services as well as general preventive health recommendations were provided to patient.     Lauree Chandler, NP  05/31/2018

## 2018-05-31 NOTE — Progress Notes (Signed)
This service is provided via telemedicine  No vital signs collected/recorded due to the encounter was a telemedicine visit.   Location of patient (ex: home, work): Home  Patient consents to a telephone visit:  Yes  Location of the provider (ex: office, home):  [employer], Office      Names of all persons participating in the telemedicine service and their role in the encounter:  S.Chrae B/CMA, Sherrie Mustache, NP, and Patient    Time spent on call: 12 min with CMA

## 2018-05-31 NOTE — Patient Instructions (Signed)
[name] , Thank you for taking time to come for your Medicare Wellness Visit. I appreciate your ongoing commitment to your health goals. Please review the following plan we discussed and let me know if I can assist you in the future.   Screening recommendations/referrals: Colonoscopy aged out Mammogram order placed, you can schedule Bone Density order placed, you can schedule Recommended yearly ophthalmology/optometry visit for glaucoma screening and checkup Recommended yearly dental visit for hygiene and checkup  Vaccinations: Influenza vaccine up to date Pneumococcal vaccine up to date Tdap vaccine up to date Shingles vaccine up to date    Advanced directives: bring to office so we can have on file.   Next appointment: 1 year for AWV    Preventive Care 14 Years and Older, Female Preventive care refers to lifestyle choices and visits with your health care provider that can promote health and wellness. What does preventive care include?  A yearly physical exam. This is also called an annual well check.  Dental exams once or twice a year.  Routine eye exams. Ask your health care provider how often you should have your eyes checked.  Personal lifestyle choices, including:  Daily care of your teeth and gums.  Regular physical activity.  Eating a healthy diet.  Avoiding tobacco and drug use.  Limiting alcohol use.  Practicing safe sex.  Taking low-dose aspirin every day.  Taking vitamin and mineral supplements as recommended by your health care provider. What happens during an annual well check? The services and screenings done by your health care provider during your annual well check will depend on your age, overall health, lifestyle risk factors, and family history of disease. Counseling  Your health care provider may ask you questions about your:  Alcohol use.  Tobacco use.  Drug use.  Emotional well-being.  Home and relationship well-being.  Sexual  activity.  Eating habits.  History of falls.  Memory and ability to understand (cognition).  Work and work Statistician.  Reproductive health. Screening  You may have the following tests or measurements:  Height, weight, and BMI.  Blood pressure.  Lipid and cholesterol levels. These may be checked every 5 years, or more frequently if you are over 94 years old.  Skin check.  Lung cancer screening. You may have this screening every year starting at age 61 if you have a 30-pack-year history of smoking and currently smoke or have quit within the past 15 years.  Fecal occult blood test (FOBT) of the stool. You may have this test every year starting at age 81.  Flexible sigmoidoscopy or colonoscopy. You may have a sigmoidoscopy every 5 years or a colonoscopy every 10 years starting at age 30.  Hepatitis C blood test.  Hepatitis B blood test.  Sexually transmitted disease (STD) testing.  Diabetes screening. This is done by checking your blood sugar (glucose) after you have not eaten for a while (fasting). You may have this done every 1-3 years.  Bone density scan. This is done to screen for osteoporosis. You may have this done starting at age 65.  Mammogram. This may be done every 1-2 years. Talk to your health care provider about how often you should have regular mammograms. Talk with your health care provider about your test results, treatment options, and if necessary, the need for more tests. Vaccines  Your health care provider may recommend certain vaccines, such as:  Influenza vaccine. This is recommended every year.  Tetanus, diphtheria, and acellular pertussis (Tdap, Td) vaccine.  You may need a Td booster every 10 years.  Zoster vaccine. You may need this after age 56.  Pneumococcal 13-valent conjugate (PCV13) vaccine. One dose is recommended after age 28.  Pneumococcal polysaccharide (PPSV23) vaccine. One dose is recommended after age 58. Talk to your health care  provider about which screenings and vaccines you need and how often you need them. This information is not intended to replace advice given to you by your health care provider. Make sure you discuss any questions you have with your health care provider. Document Released: 03/14/2015 Document Revised: 11/05/2015 Document Reviewed: 12/17/2014 Elsevier Interactive Patient Education  2017 [place] Prevention in the Home Falls can cause injuries. They can happen to people of all ages. There are many things you can do to make your home safe and to help prevent falls. What can I do on the outside of my home?  Regularly fix the edges of walkways and driveways and fix any cracks.  Remove anything that might make you trip as you walk through a door, such as a raised step or threshold.  Trim any bushes or trees on the path to your home.  Use bright outdoor lighting.  Clear any walking paths of anything that might make someone trip, such as rocks or tools.  Regularly check to see if handrails are loose or broken. Make sure that both sides of any steps have handrails.  Any raised decks and porches should have guardrails on the edges.  Have any leaves, snow, or ice cleared regularly.  Use sand or salt on walking paths during winter.  Clean up any spills in your garage right away. This includes oil or grease spills. What can I do in the bathroom?  Use night lights.  Install grab bars by the toilet and in the tub and shower. Do not use towel bars as grab bars.  Use non-skid mats or decals in the tub or shower.  If you need to sit down in the shower, use a plastic, non-slip stool.  Keep the floor dry. Clean up any water that spills on the floor as soon as it happens.  Remove soap buildup in the tub or shower regularly.  Attach bath mats securely with double-sided non-slip rug tape.  Do not have throw rugs and other things on the floor that can make you trip. What can I do in  the bedroom?  Use night lights.  Make sure that you have a light by your bed that is easy to reach.  Do not use any sheets or blankets that are too big for your bed. They should not hang down onto the floor.  Have a firm chair that has side arms. You can use this for support while you get dressed.  Do not have throw rugs and other things on the floor that can make you trip. What can I do in the kitchen?  Clean up any spills right away.  Avoid walking on wet floors.  Keep items that you use a lot in easy-to-reach places.  If you need to reach something above you, use a strong step stool that has a grab bar.  Keep electrical cords out of the way.  Do not use floor polish or wax that makes floors slippery. If you must use wax, use non-skid floor wax.  Do not have throw rugs and other things on the floor that can make you trip. What can I do with my stairs?  Do not leave any  items on the stairs.  Make sure that there are handrails on both sides of the stairs and use them. Fix handrails that are broken or loose. Make sure that handrails are as long as the stairways.  Check any carpeting to make sure that it is firmly attached to the stairs. Fix any carpet that is loose or worn.  Avoid having throw rugs at the top or bottom of the stairs. If you do have throw rugs, attach them to the floor with carpet tape.  Make sure that you have a light switch at the top of the stairs and the bottom of the stairs. If you do not have them, ask someone to add them for you. What else can I do to help prevent falls?  Wear shoes that:  Do not have high heels.  Have rubber bottoms.  Are comfortable and fit you well.  Are closed at the toe. Do not wear sandals.  If you use a stepladder:  Make sure that it is fully opened. Do not climb a closed stepladder.  Make sure that both sides of the stepladder are locked into place.  Ask someone to hold it for you, if possible.  Clearly mark and  make sure that you can see:  Any grab bars or handrails.  First and last steps.  Where the edge of each step is.  Use tools that help you move around (mobility aids) if they are needed. These include:  Canes.  Walkers.  Scooters.  Crutches.  Turn on the lights when you go into a dark area. Replace any light bulbs as soon as they burn out.  Set up your furniture so you have a clear path. Avoid moving your furniture around.  If any of your floors are uneven, fix them.  If there are any pets around you, be aware of where they are.  Review your medicines with your doctor. Some medicines can make you feel dizzy. This can increase your chance of falling. Ask your doctor what other things that you can do to help prevent falls. This information is not intended to replace advice given to you by your health care provider. Make sure you discuss any questions you have with your health care provider. Document Released: 12/12/2008 Document Revised: 07/24/2015 Document Reviewed: 03/22/2014 Elsevier Interactive Patient Education  2017 [employer].

## 2018-05-31 NOTE — Progress Notes (Signed)
This service is provided via telemedicine  No vital signs collected/recorded due to the encounter was a telemedicine visit.   Location of patient (ex: home, work):  Home  Patient consents to a telephone visit:  Yes  Location of the provider (ex: office, home):  [employer], Office    Names of all persons participating in the telemedicine service and their role in the encounter:  S.Chrae B/CMA, Sherrie Mustache, NP, and Patient   Time spent on call:  12 min  Virtual Visit via Telephone Note  I connected with [name] on 05/31/18 at 0945am by telephone and verified that I am speaking with the correct person using two identifiers.   I discussed the limitations, risks, security and privacy concerns of performing an evaluation and management service by telephone and the availability of in person appointments. I also discussed with the patient that there may be a patient responsible charge related to this service. The patient expressed understanding and agreed to proceed.      Careteam: Patient Care Team: Lauree Chandler, NP as PCP - General (Geriatric Medicine) Druscilla Brownie, MD as Consulting Physician (Dermatology) [name] Irani, MD (Inactive) as Consulting Physician (Gastroenterology) Neldon Mc, MD as Consulting Physician (General Surgery) Lorretta Harp, MD as Consulting Physician (Cardiology) Bjorn Loser, MD as Consulting Physician (Urology) Lavell Anchors, MD as Consulting Physician (Ophthalmology)  Advanced Directive information Does Patient Have a Medical Advance Directive?: No, Would patient like information on creating a medical advance directive?: Yes (MAU/Ambulatory/Procedural Areas - Information given)(Given at previous visit )  Allergies  Allergen Reactions  . Lisinopril Cough  . Codeine Rash    All over the body.    Chief Complaint  Patient presents with  . Follow-up    Blood pressure follow-up      HPI: Patient is a 85  y.o. female to follow up blood pressure Blood pressure was elevated in office and losartan was increased to 50 mg daily Last night she took blood pressure and it was 116/50. another reading she got was 133/51.  Has not taken blood pressure today.  Reports blood pressure is better.  No side effects from increasing medication.  Still has a little LE edema but overall better with lasix.     Review of Systems:  Review of Systems  Constitutional: Negative for chills, fever and malaise/fatigue.  Respiratory: Positive for shortness of breath (chronic and stable). Negative for cough and sputum production.   Cardiovascular: Negative for chest pain and leg swelling.  Neurological: Negative for dizziness and headaches.    Past Medical History:  Diagnosis Date  . Aortic stenosis 07/20/2012   Aortic stenosis   . Aortic valve disorder 08/13/2011   ECHO - EF >16%; mild diastolic dysfunction; calcified aortic valve, not well visualized; mod/severe aortic stenosis w/ worsening gradients when compared to 2012  . Arthritis   . Cancer ([place])    Breast  . Carotid bruit 08/06/2008   Doppler - R ECA demonstrates noarrowing w/ elevated velocities consistent w/ >70% diameter reduction; R and L ICAs show no evidence of diameter reduction, significant tortuosity or vascular abnormality;   . Change in voice   . COPD (chronic obstructive pulmonary disease) ([place])   . Hearing loss   . Heart murmur   . Hyperlipidemia   . Hypertension    dr Gwenlyn Found  . Osteoporosis   . PAF (paroxysmal atrial fibrillation) ([place]) 09/04/2012  . PVD (peripheral vascular disease) ([place]) 08/13/2010   R/P MV - normal pattern  of perfusion in all regions, EF 76%; no significant wall abnormalities noted; normal perfusion study  . Right carotid bruit    high-grade right external carotid artery stenosis by 2 parts ultrasound 2 years ago  . S/P aortic valve replacement with bioprosthetic valve 08/30/2012   21 mm [facility] Ease bovine pericardial  tissue valve   Past Surgical History:  Procedure Laterality Date  . ABDOMINAL HYSTERECTOMY     Partial  . AORTIC VALVE REPLACEMENT N/A 08/30/2012   Procedure: AORTIC VALVE REPLACEMENT (AVR);  Surgeon: Rexene Alberts, MD;  Location: [place];  Service: Open Heart Surgery;  Laterality: N/A;  . BIOPSY SHOULDER Left 10/08/2005   shave biopsy  . BREAST LUMPECTOMY  02/25/1997   right  . CARDIAC CATHETERIZATION    . [place]  . COSMETIC SURGERY Left 1997   Breast implant  . EYE SURGERY  1997   Cataract surgery  . INTRAOPERATIVE TRANSESOPHAGEAL ECHOCARDIOGRAM N/A 08/30/2012   Procedure: INTRAOPERATIVE TRANSESOPHAGEAL ECHOCARDIOGRAM;  Surgeon: Rexene Alberts, MD;  Location: [place];  Service: Open Heart Surgery;  Laterality: N/A;  . LEFT AND RIGHT HEART CATHETERIZATION WITH CORONARY ANGIOGRAM N/A 08/14/2012   Procedure: LEFT AND RIGHT HEART CATHETERIZATION WITH CORONARY ANGIOGRAM;  Surgeon: Lorretta Harp, MD;  Location: [facility] CATH LAB;  Service: Cardiovascular;  Laterality: N/A;  . LEFT HEART CATH  08/14/12   Nl cors, AS  . MASTECTOMY  09/29/95   left  . PARTIAL HYSTERECTOMY    . TOTAL HIP ARTHROPLASTY  09/2010   Social History:   reports that she has never smoked. She has never used smokeless tobacco. She reports that she does not drink alcohol or use drugs.  Family History  Problem Relation Age of Onset  . Cancer Mother        Bladder  . Early death Father        Tractor accident  . Early death Brother        Radiation protection practitioner  . Early death Brother        during heart surgery    Medications: Patient's Medications  New Prescriptions   No medications on file  Previous Medications   ACETAMINOPHEN (TYLENOL) 325 MG TABLET    Take 2 tablets (650 mg total) by mouth every 6 (six) hours as needed.   ALBUTEROL (PROAIR HFA) 108 (90 BASE) MCG/ACT INHALER    INHALE 1 PUFF INTO THE LUNGS EVERY 6 (SIX) HOURS AS NEEDED FOR WHEEZING OR SHORTNESS OF BREATH.   ASCORBIC ACID (VITAMIN C) 100 MG  TABLET    Take 100 mg by mouth daily.   BIOTIN 5000 MCG CAPS    Take 1 capsule by mouth daily.   BUDESONIDE-FORMOTEROL (SYMBICORT) 160-4.5 MCG/ACT INHALER    Inhale 2 puffs into the lungs 2 (two) times daily.   CALCIUM CITRATE-VITAMIN D (CITRACAL + D PO)    Take 1 tablet by mouth 2 (two) times daily.    [facility] LORATADINE 10 MG TABLET    TAKE 1 TABLET (10 MG TOTAL) BY MOUTH DAILY.   ELIQUIS 2.5 MG TABS TABLET    TAKE 1 TABLET BY MOUTH TWICE A DAY   FLUTICASONE (FLONASE) 50 MCG/ACT NASAL SPRAY    Place 1 spray into both nostrils 2 (two) times daily.   FUROSEMIDE (LASIX) 20 MG TABLET    Take 1 tablet (20 mg total) by mouth daily.   HYDROXYZINE (ATARAX/VISTARIL) 25 MG TABLET    TAKE 1 TABLET BY MOUTH AS NEEDED  LOSARTAN (COZAAR) 25 MG TABLET    Take 2 tablets (50 mg total) by mouth daily. Take 2 tabs to = 50 mg total   MAGNESIUM HYDROXIDE (PHILLIPS MILK OF MAGNESIA PO)    Take 15 mL by mouth. Take daily at 6 o'clock at night   METOPROLOL TARTRATE (LOPRESSOR) 25 MG TABLET    TAKE 1 TABLET (25 MG TOTAL) BY MOUTH 2 (TWO) TIMES DAILY.   MIRABEGRON ER (MYRBETRIQ) 50 MG TB24 TABLET    Take 50 mg by mouth daily.   OMEPRAZOLE (PRILOSEC) 20 MG CAPSULE    TAKE 2 CAPSULES BY MOUTH DAILY FOR REFLUX   SHARK LIVER OIL-COCOA BUTTER (PREPARATION H) 0.25-3-85.5 % SUPPOSITORY    Place 1 suppository rectally as needed.   SODIUM CHLORIDE (OCEAN) 0.65 % SOLN NASAL SPRAY    Place 1 spray into both nostrils as needed for congestion.   TIOTROPIUM (SPIRIVA) 18 MCG INHALATION CAPSULE    Place 1 capsule (18 mcg total) into inhaler and inhale daily.   VITAMIN E 400 UNIT CAPSULE    Take 400 Units by mouth daily.  Modified Medications   No medications on file  Discontinued Medications   No medications on file     Physical Exam:  Unable due to tele-visit.   Labs reviewed: Basic Metabolic Panel: Recent Labs    11/08/17 1603 04/12/18 0803 05/03/18 1618  NA 137 142 141  K 4.8 5.0 4.8  CL 102 105 103  CO2 30 30 31    GLUCOSE 95 91 91  BUN 12 17 25   CREATININE 1.14* 1.30* 1.22*  CALCIUM 10.3 9.9 10.2   Liver Function Tests: Recent Labs    09/16/17 0939 11/08/17 1603 04/12/18 0803  AST 68* 32 36*  ALT 72* 26 36*  BILITOT 0.5 0.7 0.6  PROT 6.0* 6.2 5.6*   No results for input(s): LIPASE, AMYLASE in the last 8760 hours. No results for input(s): AMMONIA in the last 8760 hours. CBC: Recent Labs    09/08/17 1625 09/16/17 0939  WBC  --  7.0  NEUTROABS  --  4,158  HGB 11.9* 11.7  HCT 35.0* 35.3  MCV  --  94.1  PLT  --  262   Lipid Panel: Recent Labs    04/12/18 0803  CHOL 117  HDL 42*  LDLCALC 58  TRIG 89  CHOLHDL 2.8   TSH: No results for input(s): TSH in the last 8760 hours. A1C: Lab Results  Component Value Date   HGBA1C 5.5 08/28/2012     Assessment/Plan 1. Essential hypertension Continue losartan 50 mg by mouth daily and lopressor 25 mg by mouth twice daily. Blood pressure has improved with increase in losartan. Low sodium diet encouraged.   Next appt: 3 months for routine follow up.  [employer]. Harle Battiest  [facility] [phone]   Follow Up Instructions:    I discussed the assessment and treatment plan with the patient. The patient was provided an opportunity to ask questions and all were answered. The patient agreed with the plan and demonstrated an understanding of the instructions.   The patient was advised to call back or seek an in-person evaluation if the symptoms worsen or if the condition fails to improve as anticipated.  I provided 7 minutes of non-face-to-face time during this encounter.   Lauree Chandler, NP

## 2018-06-01 ENCOUNTER — Other Ambulatory Visit: Payer: Self-pay | Admitting: *Deleted

## 2018-06-01 MED ORDER — HYDROXYZINE HCL 25 MG PO TABS: 25.0000 mg | ORAL_TABLET | ORAL | 3 refills | Status: DC | PRN

## 2018-06-01 MED ORDER — HYDROXYZINE HCL 25 MG PO TABS: ORAL_TABLET | ORAL | 3 refills | Status: DC

## 2018-06-01 NOTE — Addendum Note (Signed)
Addended by: Rafael Bihari A on: 06/01/2018 04:27 PM   Modules accepted: Orders

## 2018-06-01 NOTE — Telephone Encounter (Signed)
Received fax from pharmacy stating need dx.Patient stated she uses it for itching and she has been on it for years.

## 2018-06-01 NOTE — Telephone Encounter (Signed)
Patient requested refill

## 2018-06-19 ENCOUNTER — Institutional Professional Consult (permissible substitution): Payer: 59 | Admitting: Pulmonary Disease

## 2018-06-21 ENCOUNTER — Institutional Professional Consult (permissible substitution): Payer: Medicare Other | Admitting: Internal Medicine

## 2018-06-25 ENCOUNTER — Encounter: Payer: Self-pay | Admitting: Nurse Practitioner

## 2018-07-12 ENCOUNTER — Telehealth: Payer: Self-pay | Admitting: *Deleted

## 2018-07-12 NOTE — Telephone Encounter (Signed)
Can we put her down for a tele-visit tomorrow, have her take her blood pressure and HR (after she has taken her medication) for [place] so we can have updated reading

## 2018-07-12 NOTE — Telephone Encounter (Signed)
[name], daughter called and stated that patient is wanting to cut her Losartan back to 25mg . Stated that since taking the higher dose her hair has been thinning.   Also patient stated that she read that the Myrbetriq can increase blood pressure. Patient stated that she is willing to go off of the medication to see if it will help.   Please Advise.

## 2018-07-12 NOTE — Telephone Encounter (Signed)
Had appointments scheduled for tomorrow. Scheduled a TeleVisit for Friday instead.

## 2018-07-14 ENCOUNTER — Ambulatory Visit: Payer: 59 | Admitting: Nurse Practitioner

## 2018-07-14 DIAGNOSIS — J385 Laryngeal spasm: Secondary | ICD-10-CM | POA: Diagnosis not present

## 2018-07-18 ENCOUNTER — Other Ambulatory Visit: Payer: Self-pay

## 2018-07-18 ENCOUNTER — Ambulatory Visit: Payer: Medicare Other | Admitting: Nurse Practitioner

## 2018-07-18 ENCOUNTER — Ambulatory Visit (INDEPENDENT_AMBULATORY_CARE_PROVIDER_SITE_OTHER): Payer: Medicare Other | Admitting: Nurse Practitioner

## 2018-07-18 ENCOUNTER — Encounter: Payer: Self-pay | Admitting: Nurse Practitioner

## 2018-07-18 DIAGNOSIS — N3281 Overactive bladder: Secondary | ICD-10-CM

## 2018-07-18 DIAGNOSIS — I10 Essential (primary) hypertension: Secondary | ICD-10-CM

## 2018-07-18 NOTE — Patient Instructions (Signed)
STOP myrbetriq, monitor urination and blood pressure. We will follow up at next office visit. Call prior if needed.

## 2018-07-18 NOTE — Progress Notes (Signed)
This service is provided via telemedicine  No vital signs collected/recorded due to the encounter was a telemedicine visit.   Location of patient (ex: home, work):  Home  Patient consents to a telephone visit: Yes  Location of the provider (ex: office, home):  [facility], Office   Name of any referring provider:  N/A  Names of all persons participating in the telemedicine service and their role in the encounter:  S.Chrae B/CMA, Sherrie Mustache, NP, and Patient   Time spent on call:  7 min with medical assistant      Careteam: Patient Care Team: Lauree Chandler, NP as PCP - General (Geriatric Medicine) Druscilla Brownie, MD as Consulting Physician (Dermatology) [name] Irani, MD (Inactive) as Consulting Physician (Gastroenterology) Neldon Mc, MD as Consulting Physician (General Surgery) Lorretta Harp, MD as Consulting Physician (Cardiology) Bjorn Loser, MD as Consulting Physician (Urology) Lavell Anchors, MD as Consulting Physician (Ophthalmology)  Advanced Directive information    Allergies  Allergen Reactions  . Lisinopril Cough  . Codeine Rash    All over the body.    Chief Complaint  Patient presents with  . Acute Visit    Discuss Myrbetriq, patient thinks it is causing elevated blood pressure. B/P reading today was 158/83 (took medication prior)      HPI: Patient is a 85 y.o. female to discuss blood pressure.  Reports she has been on myrbetriq for a long time but started reading the insert and said could cause blood pressure to be worse if you have hx of htn.  Pt with hx of OAB- reports this is controlled at this time.   htn- 158/83, 154/80. During the day 145/74, 130/73, 115/70 Taking losartan 25 mg daily and metopro;lol 25 mg by mouth twice daily   Review of Systems:  Review of Systems  Constitutional: Negative for chills, fever and malaise/fatigue.  Respiratory: Positive for shortness of breath (chronic and stable).  Negative for cough and sputum production.   Cardiovascular: Negative for chest pain and leg swelling.  Genitourinary: Negative for dysuria, frequency and urgency.       Hx of oab, controlled currently. Does not have to get up at night.   Neurological: Negative for dizziness and headaches.    Past Medical History:  Diagnosis Date  . Aortic stenosis 07/20/2012   Aortic stenosis   . Aortic valve disorder 08/13/2011   ECHO - EF >82%; mild diastolic dysfunction; calcified aortic valve, not well visualized; mod/severe aortic stenosis w/ worsening gradients when compared to 2012  . Arthritis   . Cancer ([place])    Breast  . Carotid bruit 08/06/2008   Doppler - R ECA demonstrates noarrowing w/ elevated velocities consistent w/ >70% diameter reduction; R and L ICAs show no evidence of diameter reduction, significant tortuosity or vascular abnormality;   . Change in voice   . COPD (chronic obstructive pulmonary disease) ([place])   . Hearing loss   . Heart murmur   . Hyperlipidemia   . Hypertension    dr Gwenlyn Found  . Osteoporosis   . PAF (paroxysmal atrial fibrillation) ([place]) 09/04/2012  . PVD (peripheral vascular disease) ([place]) 08/13/2010   R/P MV - normal pattern of perfusion in all regions, EF 76%; no significant wall abnormalities noted; normal perfusion study  . Right carotid bruit    high-grade right external carotid artery stenosis by 2 parts ultrasound 2 years ago  . S/P aortic valve replacement with bioprosthetic valve 08/30/2012   21 mm [facility] Ease bovine  pericardial tissue valve   Past Surgical History:  Procedure Laterality Date  . ABDOMINAL HYSTERECTOMY     Partial  . AORTIC VALVE REPLACEMENT N/A 08/30/2012   Procedure: AORTIC VALVE REPLACEMENT (AVR);  Surgeon: Rexene Alberts, MD;  Location: [place];  Service: Open Heart Surgery;  Laterality: N/A;  . BIOPSY SHOULDER Left 10/08/2005   shave biopsy  . BREAST LUMPECTOMY  02/25/1997   right  . CARDIAC CATHETERIZATION    . [place]  . COSMETIC SURGERY Left 1997   Breast implant  . EYE SURGERY  1997   Cataract surgery  . INTRAOPERATIVE TRANSESOPHAGEAL ECHOCARDIOGRAM N/A 08/30/2012   Procedure: INTRAOPERATIVE TRANSESOPHAGEAL ECHOCARDIOGRAM;  Surgeon: Rexene Alberts, MD;  Location: [place];  Service: Open Heart Surgery;  Laterality: N/A;  . LEFT AND RIGHT HEART CATHETERIZATION WITH CORONARY ANGIOGRAM N/A 08/14/2012   Procedure: LEFT AND RIGHT HEART CATHETERIZATION WITH CORONARY ANGIOGRAM;  Surgeon: Lorretta Harp, MD;  Location: [facility] CATH LAB;  Service: Cardiovascular;  Laterality: N/A;  . LEFT HEART CATH  08/14/12   Nl cors, AS  . MASTECTOMY  09/29/95   left  . PARTIAL HYSTERECTOMY    . TOTAL HIP ARTHROPLASTY  09/2010   Social History:   reports that she has never smoked. She has never used smokeless tobacco. She reports that she does not drink alcohol or use drugs.  Family History  Problem Relation Age of Onset  . Cancer Mother        Bladder  . Early death Father        Tractor accident  . Early death Brother        Radiation protection practitioner  . Early death Brother        during heart surgery    Medications: Patient's Medications  New Prescriptions   No medications on file  Previous Medications   ACETAMINOPHEN (TYLENOL) 325 MG TABLET    Take 2 tablets (650 mg total) by mouth every 6 (six) hours as needed.   ALBUTEROL (PROAIR HFA) 108 (90 BASE) MCG/ACT INHALER    INHALE 1 PUFF INTO THE LUNGS EVERY 6 (SIX) HOURS AS NEEDED FOR WHEEZING OR SHORTNESS OF BREATH.   ASCORBIC ACID (VITAMIN C) 100 MG TABLET    Take 100 mg by mouth daily.   BIOTIN 5000 MCG CAPS    Take 1 capsule by mouth daily.   BUDESONIDE-FORMOTEROL (SYMBICORT) 160-4.5 MCG/ACT INHALER    Inhale 2 puffs into the lungs 2 (two) times daily.   CALCIUM CITRATE-VITAMIN D (CITRACAL + D PO)    Take 1 tablet by mouth 2 (two) times daily.    CVS LORATADINE 10 MG TABLET    TAKE 1 TABLET (10 MG TOTAL) BY MOUTH DAILY.   ELIQUIS 2.5 MG TABS TABLET    TAKE 1 TABLET BY MOUTH  TWICE A DAY   FLUTICASONE (FLONASE) 50 MCG/ACT NASAL SPRAY    Place 1 spray into both nostrils 2 (two) times daily.   FUROSEMIDE (LASIX) 20 MG TABLET    Take 1 tablet (20 mg total) by mouth daily.   HYDROXYZINE (ATARAX/VISTARIL) 25 MG TABLET    Take one tablet by mouth daily as needed for itching.   LOSARTAN (COZAAR) 25 MG TABLET    Take 2 tablets (50 mg total) by mouth daily. Take 2 tabs to = 50 mg total   MAGNESIUM HYDROXIDE (PHILLIPS MILK OF MAGNESIA PO)    Take 15 mL by mouth. Take daily at 6 o'clock at night  METOPROLOL TARTRATE (LOPRESSOR) 25 MG TABLET    TAKE 1 TABLET (25 MG TOTAL) BY MOUTH 2 (TWO) TIMES DAILY.   MIRABEGRON ER (MYRBETRIQ) 50 MG TB24 TABLET    Take 50 mg by mouth daily.   OMEPRAZOLE (PRILOSEC) 20 MG CAPSULE    TAKE 2 CAPSULES BY MOUTH DAILY FOR REFLUX   SHARK LIVER OIL-COCOA BUTTER (PREPARATION H) 0.25-3-85.5 % SUPPOSITORY    Place 1 suppository rectally as needed.   SODIUM CHLORIDE (OCEAN) 0.65 % SOLN NASAL SPRAY    Place 1 spray into both nostrils as needed for congestion.   TIOTROPIUM (SPIRIVA) 18 MCG INHALATION CAPSULE    Place 1 capsule (18 mcg total) into inhaler and inhale daily.   VITAMIN E 400 UNIT CAPSULE    Take 400 Units by mouth daily.  Modified Medications   No medications on file  Discontinued Medications   No medications on file     Physical Exam: 158/83    Labs reviewed: Basic Metabolic Panel: Recent Labs    11/08/17 1603 04/12/18 0803 05/03/18 1618  NA 137 142 141  K 4.8 5.0 4.8  CL 102 105 103  CO2 30 30 31   GLUCOSE 95 91 91  BUN 12 17 25   CREATININE 1.14* 1.30* 1.22*  CALCIUM 10.3 9.9 10.2   Liver Function Tests: Recent Labs    09/16/17 0939 11/08/17 1603 04/12/18 0803  AST 68* 32 36*  ALT 72* 26 36*  BILITOT 0.5 0.7 0.6  PROT 6.0* 6.2 5.6*   No results for input(s): LIPASE, AMYLASE in the last 8760 hours. No results for input(s): AMMONIA in the last 8760 hours. CBC: Recent Labs    09/08/17 1625 09/16/17 0939  WBC   --  7.0  NEUTROABS  --  4,158  HGB 11.9* 11.7  HCT 35.0* 35.3  MCV  --  94.1  PLT  --  262   Lipid Panel: Recent Labs    04/12/18 0803  CHOL 117  HDL 42*  LDLCALC 58  TRIG 89  CHOLHDL 2.8   TSH: No results for input(s): TSH in the last 8760 hours. A1C: Lab Results  Component Value Date   HGBA1C 5.5 08/28/2012     Assessment/Plan 1. Overactive bladder Feels like myrbetriq is contributing to elevated blood pressure readings and OAB is currently not an issue. She is taking myrbetriq 50 mg daily so will stop at this time. If she starts having more issues with OAB consider starting back at 25 mg daily  2. Essential hypertension Blood pressure variable but have been on the higher side. Will stop myrbetriq and monitor. May also need adjustment in blood pressure medication.  Next appt: to keep follow up as scheduled. 08/31/2018, sooner if needed.  Carlos American. Harle Battiest  [facility] [phone]    Virtual Visit via Telephone Note  I connected with@ on 07/18/18 at  1:00 PM EDT by telephone and verified that I am speaking with the correct person using two identifiers.  Location: Patient: home Provider: office   I discussed the limitations, risks, security and privacy concerns of performing an evaluation and management service by telephone and the availability of in person appointments. I also discussed with the patient that there may be a patient responsible charge related to this service. The patient expressed understanding and agreed to proceed.   I discussed the assessment and treatment plan with the patient. The patient was provided an opportunity to ask questions and all were answered. The  patient agreed with the plan and demonstrated an understanding of the instructions.   The patient was advised to call back or seek an in-person evaluation if the symptoms worsen or if the condition fails to improve as anticipated.  I provided 8 minutes of  non-face-to-face time during this encounter.  Carlos American. Harle Battiest Avs printed and mailed

## 2018-08-16 ENCOUNTER — Other Ambulatory Visit: Payer: Self-pay | Admitting: Nurse Practitioner

## 2018-08-16 NOTE — Telephone Encounter (Signed)
Patient requesting refill of losartan 25 mg tablets. Verified medication and pharmacy. High Allergy Alert indicated routing to provider for approval.

## 2018-08-22 ENCOUNTER — Other Ambulatory Visit: Payer: Self-pay

## 2018-08-22 ENCOUNTER — Ambulatory Visit (INDEPENDENT_AMBULATORY_CARE_PROVIDER_SITE_OTHER): Payer: Medicare Other

## 2018-08-22 ENCOUNTER — Encounter: Payer: Self-pay | Admitting: Internal Medicine

## 2018-08-22 ENCOUNTER — Ambulatory Visit (INDEPENDENT_AMBULATORY_CARE_PROVIDER_SITE_OTHER): Payer: Medicare Other | Admitting: Internal Medicine

## 2018-08-22 DIAGNOSIS — R058 Other specified cough: Secondary | ICD-10-CM

## 2018-08-22 DIAGNOSIS — R0609 Other forms of dyspnea: Secondary | ICD-10-CM | POA: Diagnosis not present

## 2018-08-22 DIAGNOSIS — J449 Chronic obstructive pulmonary disease, unspecified: Secondary | ICD-10-CM

## 2018-08-22 DIAGNOSIS — R05 Cough: Secondary | ICD-10-CM

## 2018-08-22 DIAGNOSIS — I517 Cardiomegaly: Secondary | ICD-10-CM | POA: Diagnosis not present

## 2018-08-22 DIAGNOSIS — R0600 Dyspnea, unspecified: Secondary | ICD-10-CM

## 2018-08-22 DIAGNOSIS — I700 Atherosclerosis of aorta: Secondary | ICD-10-CM | POA: Diagnosis not present

## 2018-08-22 MED ORDER — TIOTROPIUM BROMIDE MONOHYDRATE 18 MCG IN CAPS: 18.0000 ug | ORAL_CAPSULE | Freq: Every day | RESPIRATORY_TRACT | 12 refills | Status: DC

## 2018-08-22 MED ORDER — BUDESONIDE-FORMOTEROL FUMARATE 160-4.5 MCG/ACT IN AERO: 2.0000 | INHALATION_SPRAY | Freq: Two times a day (BID) | RESPIRATORY_TRACT | 3 refills | Status: DC

## 2018-08-22 NOTE — Patient Instructions (Addendum)
Prilosec (omeprazole)  20 mg x 2 x 30 min  Take 30- 60 min before your first and last meals of the day and take pepcid ac (otc)  20 mg after supper.  If you are still having troubles with the nighttime cough > For drainage / throat tickle try take CHLORPHENIRAMINE  4 mg  (Chlortab 4mg   at [employer] should be easiest to find in the green box)  take one every 4 hours as needed - available over the counter- may cause drowsiness so start with just a bedtime dose or two and see how you tolerate it before trying in daytime.    Stop spiriva / symbiocort and loratidine(clariton)   GERD (REFLUX)  is an extremely common cause of respiratory symptoms just like yours , many times with no obvious heartburn at all.    It can be treated with medication, but also with lifestyle changes including elevation of the head of your bed (ideally with 6 -8inch blocks under the headboard of your bed),  Smoking cessation, avoidance of late meals, excessive alcohol, and avoid fatty foods, chocolate, peppermint, colas, red wine, and acidic juices such as orange juice.  NO MINT OR MENTHOL PRODUCTS SO NO COUGH DROPS  USE SUGARLESS CANDY INSTEAD (Jolley ranchers or Stover's or Life Savers) or even ice chips will also do - the key is to swallow to prevent all throat clearing. NO OIL BASED VITAMINS - use powdered substitutes.  Avoid fish oil when coughing.    Please schedule a follow up office visit in 4 weeks, sooner if needed  with all medications /inhalers/ solutions in hand so we can verify exactly what you are taking. This includes all medications from all doctors and over the counters

## 2018-08-22 NOTE — Progress Notes (Signed)
[name], female    DOB: 1932/01/08,     MRN: [id number]   Brief patient profile:  56 yowf never smoker  With onset doe house to mailbox x 2014 and no better p  AVR 2014 (though records suggest her breathing did improve) or spiriva, symbicort or saba assoc with noct cough so referred to pulmonary clinic 08/22/2018 by Sherrie Mustache   Was being followed by [facility] / Terie Purser Joya Gaskins since 1992 for "laryngel spasms" with botulinum injects most recent 07/14/18    History of Present Illness  08/22/2018  Pulmonary/ 1st office eval/Damir Leung  Chief Complaint  Patient presents with  . Pulmonary Consult    Referred by Sherrie Mustache, NP. Pt c/o SOB since had heart surgery in 2014. She has occ cough when she lies down.   Dyspnea: slt uphill 50 ft and has to stop Cough: assoc with ex / candy helps but using peppermint  Sleep:p lie down x 5-10 min most nights stars coughing, helps to sit up  SABA use: once a day at most  Seeing Dr Joya Gaskins every 3-4 months with shots as above helps her voice and ? Her sob  for a month or two.  On  prilosec 20 mg daily 15 min ac    No obvious day to day or daytime variability or assoc excess/ purulent sputum or mucus plugs or hemoptysis or cp or chest tightness, subjective wheeze or overt sinus or hb symptoms.     Also denies any obvious fluctuation of symptoms with weather or environmental changes or other aggravating or alleviating factors except as outlined above   No unusual exposure hx or h/o childhood pna/ asthma or knowledge of premature birth.  Current Allergies, Complete Past Medical History, Past Surgical History, Family History, and Social History were reviewed in [employer] record.  ROS  The following are not active complaints unless bolded Hoarseness, sore throat, dysphagia, dental problems, itching, sneezing,  nasal congestion or discharge of excess mucus or purulent secretions, ear ache,   fever, chills, sweats, unintended wt  loss or wt gain, classically pleuritic or exertional cp,  orthopnea pnd or arm/hand swelling  or leg swelling, presyncope, palpitations, abdominal pain, anorexia, nausea, vomiting, diarrhea  or change in bowel habits or change in bladder habits, change in stools or change in urine, dysuria, hematuria,  rash, arthralgias, visual complaints, headache, numbness, weakness or ataxia or problems with walking or coordination,  change in mood or  memory.           Past Medical History:  Diagnosis Date  . Aortic stenosis 07/20/2012   Aortic stenosis   . Aortic valve disorder 08/13/2011   ECHO - EF >43%; mild diastolic dysfunction; calcified aortic valve, not well visualized; mod/severe aortic stenosis w/ worsening gradients when compared to 2012  . Arthritis   . Cancer ([employer])    Breast  . Carotid bruit 08/06/2008   Doppler - R ECA demonstrates noarrowing w/ elevated velocities consistent w/ >70% diameter reduction; R and L ICAs show no evidence of diameter reduction, significant tortuosity or vascular abnormality;   . Change in voice   . COPD (chronic obstructive pulmonary disease) ([place])   . Hearing loss   . Heart murmur   . Hyperlipidemia   . Hypertension    dr Gwenlyn Found  . Osteoporosis   . PAF (paroxysmal atrial fibrillation) ([place]) 09/04/2012  . PVD (peripheral vascular disease) ([place]) 08/13/2010   R/P MV - normal pattern of perfusion in all  regions, EF 76%; no significant wall abnormalities noted; normal perfusion study  . Right carotid bruit    high-grade right external carotid artery stenosis by 2 parts ultrasound 2 years ago  . S/P aortic valve replacement with bioprosthetic valve 08/30/2012   21 mm [facility] Ease bovine pericardial tissue valve    Outpatient Medications Prior to Visit  Medication Sig Dispense Refill  . acetaminophen (TYLENOL) 325 MG tablet Take 2 tablets (650 mg total) by mouth every 6 (six) hours as needed.    Marland Kitchen albuterol (PROAIR HFA) 108 (90 Base) MCG/ACT inhaler INHALE 1  PUFF INTO THE LUNGS EVERY 6 (SIX) HOURS AS NEEDED FOR WHEEZING OR SHORTNESS OF BREATH. 8.5 Inhaler 2  . Ascorbic Acid (VITAMIN C) 100 MG tablet Take 100 mg by mouth daily.    . Biotin 5000 MCG CAPS Take 1 capsule by mouth daily.    .       . Calcium Citrate-Vitamin D (CITRACAL + D PO) Take 1 tablet by mouth 2 (two) times daily.     . CVS LORATADINE 10 MG tablet TAKE 1 TABLET (10 MG TOTAL) BY MOUTH DAILY. 30 tablet 5  . ELIQUIS 2.5 MG TABS tablet TAKE 1 TABLET BY MOUTH TWICE A DAY 60 tablet 5  . fluticasone (FLONASE) 50 MCG/ACT nasal spray Place 1 spray into both nostrils 2 (two) times daily. 16 g 1  . furosemide (LASIX) 20 MG tablet Take 1 tablet (20 mg total) by mouth daily. 30 tablet 6  . hydrOXYzine (ATARAX/VISTARIL) 25 MG tablet Take one tablet by mouth daily as needed for itching. 30 tablet 3  . losartan (COZAAR) 25 MG tablet TAKE 2 TABLETS (50 MG TOTAL) BY MOUTH DAILY. TAKE 2 TABS TO = 50 MG TOTAL 180 tablet 1  . Magnesium Hydroxide (PHILLIPS MILK OF MAGNESIA PO) Take 15 mL by mouth. Take daily at 6 o'clock at night    . metoprolol tartrate (LOPRESSOR) 25 MG tablet TAKE 1 TABLET (25 MG TOTAL) BY MOUTH 2 (TWO) TIMES DAILY. 180 tablet 2  . omeprazole (PRILOSEC) 20 MG capsule TAKE 2 CAPSULES BY MOUTH DAILY FOR REFLUX 60 capsule 5  . shark liver oil-cocoa butter (PREPARATION H) 0.25-3-85.5 % suppository Place 1 suppository rectally as needed.    . sodium chloride (OCEAN) 0.65 % SOLN nasal spray Place 1 spray into both nostrils as needed for congestion. 1 Bottle 1  .     12  . vitamin E 400 UNIT capsule Take 400 Units by mouth daily.         Objective:     BP 140/74 (BP Location: Left Arm, Cuff Size: Normal)   Pulse 71   Temp 98.3 F (36.8 C) (Oral)   Ht 5\' 3"  (1.6 m)   Wt 153 lb 4.8 oz (69.5 kg)   SpO2 96%   BMI 27.16 kg/m   SpO2: 96 % RA  pseudowheeze / II-III/VI   amb wf with severe hoarseness and classic vcd  HEENT: nl dentition, turbinates bilaterally, and oropharynx. Nl  external ear canals without cough reflex   NECK :  without JVD/Nodes/TM/ nl carotid upstrokes bilaterally   LUNGS: no acc muscle use,  Nl contour chest which is clear to A and P bilaterally without cough on insp or exp maneuvers   CV:  RRR  II-III/VI sem  no s3 or murmur or increase in P2, and no edema   ABD:  soft and nontender with nl inspiratory excursion in the supine position. No bruits or  organomegaly appreciated, bowel sounds nl  MS:  Nl gait/ ext warm without deformities, calf tenderness, cyanosis or clubbing No obvious joint restrictions   SKIN: warm and dry without lesions    NEURO:  alert, approp, nl sensorium with  no motor or cerebellar deficits apparent.     Labs ordered/ reviewed:      Chemistry      Component Value Date/Time   NA 138 08/22/2018 1630   NA 137 09/28/2016 1423   K 3.9 08/22/2018 1630   CL 101 08/22/2018 1630   CO2 29 08/22/2018 1630   BUN 16 08/22/2018 1630   BUN 12 09/28/2016 1423   CREATININE 0.97 08/22/2018 1630   CREATININE 1.22 (H) 05/03/2018 1618      Component Value Date/Time   CALCIUM 9.9 08/22/2018 1630   ALKPHOS 60 05/21/2016 0810   AST 36 (H) 04/12/2018 0803   ALT 36 (H) 04/12/2018 0803   BILITOT 0.6 04/12/2018 0803   BILITOT 0.3 12/02/2014 0945        Lab Results  Component Value Date   WBC 7.2 08/22/2018   HGB 13.0 08/22/2018   HCT 38.5 08/22/2018   MCV 97.9 08/22/2018   PLT 165.0 08/22/2018       EOS                                                               0.1                                    08/22/2018       Lab Results  Component Value Date   TSH 1.13 08/22/2018     Lab Results  Component Value Date   PROBNP 383.0 (H) 08/22/2018           CXR PA and Lateral:   08/22/2018 :    I personally reviewed images and agree with radiology impression as follows:    No active cardiopulmonary disease.    Assessment   DOE (dyspnea on exertion) Onset 2014 improved p AVR 2014 assoc with VCD - 08/22/2018    Walked RA  2 laps @  approx 27ft each @ moderate  pace  stopped due to  End of study, sats still 98% but "winded" at end    Symptoms are markedly disproportionate to objective findings and not clear to what extent this is actually a pulmonary  problem but pt does appear to have difficult to sort out respiratory symptoms of unknown origin for which  DDX  = almost all start with A and  include Adherence, Ace Inhibitors, Acid Reflux, Active Sinus Disease, Alpha 1 Antitripsin deficiency, Anxiety masquerading as Airways dz,  ABPA,  Allergy(esp in young), Aspiration (esp in elderly), Adverse effects of meds,  Active smoking or Vaping, A bunch of PE's/clot burden (a few small clots can't cause this syndrome unless there is already severe underlying pulm or vascular dz with poor reserve),  Anemia or thyroid disorder, plus two Bs  = Bronchiectasis and Beta blocker use..and one C= CHF     Adherence is always the initial "prime suspect" and is a multilayered concern that requires a "trust but verify" approach in every patient - starting with  knowing how to use medications, especially inhalers, correctly, keeping up with refills and understanding the fundamental difference between maintenance and prns vs those medications only taken for a very short course and then stopped and not refilled.  - return with all meds in hand using a trust but verify approach to confirm accurate Medication  Reconciliation The principal here is that until we are certain that the  patients are doing what we've asked, it makes no sense to ask them to do more.   ? Acid (or non-acid) GERD > always difficult to exclude as up to 75% of pts in some series report no assoc GI/ Heartburn symptoms> rec max (24h)  acid suppression and diet restrictions/ reviewed and instructions given in writing.    ? Anxiety/depression/ deconditioning  > usually at the bottom of this list of usual suspects but should be higher on this pt's based on H and P and  note already on psychotropics and may interfere with adherence and also interpretation of response or lack thereof to symptom management which can be quite subjective.   ? Allergy/ asthma > unlikely based on no assoc rhinitis or variability  ? Adverse drug effects > stop all inhalers since not helping and may aggravate uacs, esp dpi's like spiriva handihaler  ? Anemia/ thyroid dz > excluded today   ? A bunch of PE's > unlikely on eliquis   ? 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  While not intuitively obvious, many patients with chronic low grade reflux do not cough until there is a primary insult that disturbs the protective epithelial barrier and exposes sensitive nerve endings.   This is typically viral but can due to PNDS and  either may apply here.     >>>The point is that once this occurs, it is difficult to eliminate the cycle  using anything but a maximally effective acid suppression regimen at least in the short run, accompanied by an appropriate diet to address non acid GERD and control / eliminate pnds with 1st gen H1 blockers per guidelines  (esp at hs) and try to stop the urge to cough by using non-mint/menthol hard candies with close f/u by Dr Joya Gaskins  >> ideally needs pfts with f/v loop when COVID - 19 restrictions have been lifted.      Total time devoted to counseling  > 50 % of initial 60 min office visit:  reviewed case with pt/daughter/  directly observed portions of ambulatory 02 saturation study/  discussion of  options/alternatives/ personally creating written customized instructions  in presence of pt  then going over those specific  Instructions directly with the pt including how to use all of the meds but in particular covering each new medication in detail and the difference between the maintenance= "automatic" meds and the prns using an action plan format for the latter (If this problem/symptom => do that organization reading Left to right).  Please see AVS from this visit for a full list  of these instructions which I personally wrote for this pt and  are unique to this visit.      Christinia Gully, MD 08/23/2018

## 2018-08-23 ENCOUNTER — Encounter: Payer: Self-pay | Admitting: Internal Medicine

## 2018-08-23 ENCOUNTER — Telehealth: Payer: Self-pay | Admitting: Internal Medicine

## 2018-08-23 DIAGNOSIS — R058 Other specified cough: Secondary | ICD-10-CM | POA: Insufficient documentation

## 2018-08-23 DIAGNOSIS — R05 Cough: Secondary | ICD-10-CM | POA: Insufficient documentation

## 2018-08-23 LAB — RESPIRATORY ALLERGY PROFILE REGION II ~~LOC~~

## 2018-08-23 LAB — CBC WITH DIFFERENTIAL/PLATELET
Basophils Absolute: 0.1 10*3/uL (ref 0.0–0.1)
Basophils Relative: 1.4 % (ref 0.0–3.0)
Eosinophils Absolute: 0.1 10*3/uL (ref 0.0–0.7)
Eosinophils Relative: 2 % (ref 0.0–5.0)
HCT: 38.5 % (ref 36.0–46.0)
Hemoglobin: 13 g/dL (ref 12.0–15.0)
Lymphocytes Relative: 27.8 % (ref 12.0–46.0)
Lymphs Abs: 2 10*3/uL (ref 0.7–4.0)
MCHC: 33.7 g/dL (ref 30.0–36.0)
MCV: 97.9 fl (ref 78.0–100.0)
Monocytes Absolute: 0.5 10*3/uL (ref 0.1–1.0)
Monocytes Relative: 7.3 % (ref 3.0–12.0)
Neutro Abs: 4.4 10*3/uL (ref 1.4–7.7)
Neutrophils Relative %: 61.5 % (ref 43.0–77.0)
Platelets: 165 10*3/uL (ref 150.0–400.0)
RBC: 3.93 Mil/uL (ref 3.87–5.11)
RDW: 13.9 % (ref 11.5–15.5)
WBC: 7.2 10*3/uL (ref 4.0–10.5)

## 2018-08-23 LAB — BASIC METABOLIC PANEL
BUN: 16 mg/dL (ref 6–23)
CO2: 29 mEq/L (ref 19–32)
Calcium: 9.9 mg/dL (ref 8.4–10.5)
Chloride: 101 mEq/L (ref 96–112)
Creatinine, Ser: 0.97 mg/dL (ref 0.40–1.20)
GFR: 54.54 mL/min — ABNORMAL LOW (ref >60.00)
Glucose, Bld: 87 mg/dL (ref 70–99)
Potassium: 3.9 mEq/L (ref 3.5–5.1)
Sodium: 138 mEq/L (ref 135–145)

## 2018-08-23 LAB — INTERPRETATION:

## 2018-08-23 LAB — BRAIN NATRIURETIC PEPTIDE: Pro B Natriuretic peptide (BNP): 383 pg/mL — ABNORMAL HIGH (ref 0.0–100.0)

## 2018-08-23 LAB — TSH: TSH: 1.13 u[IU]/mL (ref 0.35–4.50)

## 2018-08-23 MED ORDER — FAMOTIDINE 20 MG PO TABS: ORAL_TABLET | ORAL | Status: DC

## 2018-08-23 NOTE — Telephone Encounter (Signed)
LMTCB

## 2018-08-23 NOTE — Assessment & Plan Note (Signed)
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  While not intuitively obvious, many patients with chronic low grade reflux do not cough until there is a primary insult that disturbs the protective epithelial barrier and exposes sensitive nerve endings.   This is typically viral but can due to PNDS and  either may apply here.     >>>The point is that once this occurs, it is difficult to eliminate the cycle  using anything but a maximally effective acid suppression regimen at least in the short run, accompanied by an appropriate diet to address non acid GERD and control / eliminate pnds with 1st gen H1 blockers per guidelines  (esp at hs) and try to stop the urge to cough by using non-mint/menthol hard candies with close f/u by Dr Joya Gaskins  >> ideally needs pfts with f/v loop when COVID - 19 restrictions have been lifted.      Total time devoted to counseling  > 50 % of initial 60 min office visit:  reviewed case with pt/daughter/  directly observed portions of ambulatory 02 saturation study/  discussion of options/alternatives/ personally creating written customized instructions  in presence of pt  then going over those specific  Instructions directly with the pt including how to use all of the meds but in particular covering each new medication in detail and the difference between the maintenance= "automatic" meds and the prns using an action plan format for the latter (If this problem/symptom => do that organization reading Left to right).  Please see AVS from this visit for a full list of these instructions which I personally wrote for this pt and  are unique to this visit.

## 2018-08-23 NOTE — Progress Notes (Signed)
LMTCB

## 2018-08-23 NOTE — Assessment & Plan Note (Addendum)
Onset 2014 improved p AVR 2014 assoc with VCD - 08/22/2018   Walked RA  2 laps @  approx 233ft each @ moderate  pace  stopped due to  End of study, sats still 98% but "winded" at end    Symptoms are markedly disproportionate to objective findings and not clear to what extent this is actually a pulmonary  problem but pt does appear to have difficult to sort out respiratory symptoms of unknown origin for which  DDX  = almost all start with A and  include Adherence, Ace Inhibitors, Acid Reflux, Active Sinus Disease, Alpha 1 Antitripsin deficiency, Anxiety masquerading as Airways dz,  ABPA,  Allergy(esp in young), Aspiration (esp in elderly), Adverse effects of meds,  Active smoking or Vaping, A bunch of PE's/clot burden (a few small clots can't cause this syndrome unless there is already severe underlying pulm or vascular dz with poor reserve),  Anemia or thyroid disorder, plus two Bs  = Bronchiectasis and Beta blocker use..and one C= CHF     Adherence is always the initial "prime suspect" and is a multilayered concern that requires a "trust but verify" approach in every patient - starting with knowing how to use medications, especially inhalers, correctly, keeping up with refills and understanding the fundamental difference between maintenance and prns vs those medications only taken for a very short course and then stopped and not refilled.  - return with all meds in hand using a trust but verify approach to confirm accurate Medication  Reconciliation The principal here is that until we are certain that the  patients are doing what we've asked, it makes no sense to ask them to do more.   ? Acid (or non-acid) GERD > always difficult to exclude as up to 75% of pts in some series report no assoc GI/ Heartburn symptoms> rec max (24h)  acid suppression and diet restrictions/ reviewed and instructions given in writing.    ? Anxiety/depression/ deconditioning  > usually at the bottom of this list of usual  suspects but should be higher on this pt's based on H and P and note already on psychotropics and may interfere with adherence and also interpretation of response or lack thereof to symptom management which can be quite subjective.   ? Allergy/ asthma > unlikely based on no assoc rhinitis or variability  ? Adverse drug effects > stop all inhalers since not helping and may aggravate uacs, esp dpi's like spiriva handihaler  ? Anemia/ thyroid dz > excluded today   ? A bunch of PE's > unlikely on eliquis   ? chf >  Echo ok 10/02/17 with nl Right side also - bnp intermediate range likely due to diastolic dysfunction

## 2018-08-29 DIAGNOSIS — H43813 Vitreous degeneration, bilateral: Secondary | ICD-10-CM | POA: Diagnosis not present

## 2018-08-29 DIAGNOSIS — Z9849 Cataract extraction status, unspecified eye: Secondary | ICD-10-CM | POA: Diagnosis not present

## 2018-08-29 DIAGNOSIS — Z961 Presence of intraocular lens: Secondary | ICD-10-CM | POA: Diagnosis not present

## 2018-08-31 ENCOUNTER — Other Ambulatory Visit: Payer: Self-pay

## 2018-08-31 ENCOUNTER — Ambulatory Visit (INDEPENDENT_AMBULATORY_CARE_PROVIDER_SITE_OTHER): Payer: Medicare Other | Admitting: Nurse Practitioner

## 2018-08-31 ENCOUNTER — Encounter: Payer: Self-pay | Admitting: Nurse Practitioner

## 2018-08-31 VITALS — BP 144/72 | HR 71 | Temp 98.8°F | Ht 63.0 in | Wt 148.0 lb

## 2018-08-31 DIAGNOSIS — N3281 Overactive bladder: Secondary | ICD-10-CM | POA: Diagnosis not present

## 2018-08-31 DIAGNOSIS — R05 Cough: Secondary | ICD-10-CM

## 2018-08-31 DIAGNOSIS — K219 Gastro-esophageal reflux disease without esophagitis: Secondary | ICD-10-CM | POA: Diagnosis not present

## 2018-08-31 DIAGNOSIS — R058 Other specified cough: Secondary | ICD-10-CM

## 2018-08-31 DIAGNOSIS — I480 Paroxysmal atrial fibrillation: Secondary | ICD-10-CM

## 2018-08-31 DIAGNOSIS — I10 Essential (primary) hypertension: Secondary | ICD-10-CM | POA: Diagnosis not present

## 2018-08-31 DIAGNOSIS — R600 Localized edema: Secondary | ICD-10-CM

## 2018-08-31 DIAGNOSIS — N183 Chronic kidney disease, stage 3 unspecified: Secondary | ICD-10-CM

## 2018-08-31 DIAGNOSIS — J0140 Acute pansinusitis, unspecified: Secondary | ICD-10-CM | POA: Diagnosis not present

## 2018-08-31 MED ORDER — FLUTICASONE PROPIONATE 50 MCG/ACT NA SUSP: 1.0000 | Freq: Two times a day (BID) | NASAL | 1 refills | Status: DC

## 2018-08-31 NOTE — Progress Notes (Signed)
Careteam: Patient Care Team: Lauree Chandler, NP as PCP - General (Geriatric Medicine) Druscilla Brownie, MD as Consulting Physician (Dermatology) [name] Irani, MD (Inactive) as Consulting Physician (Gastroenterology) Neldon Mc, MD as Consulting Physician (General Surgery) Lorretta Harp, MD as Consulting Physician (Cardiology) Bjorn Loser, MD as Consulting Physician (Urology) Lavell Anchors, MD as Consulting Physician (Ophthalmology)  Advanced Directive information Does Patient Have a Medical Advance Directive?: No, Would patient like information on creating a medical advance directive?: No - Patient declined  Allergies  Allergen Reactions  . Lisinopril Cough  . Codeine Rash    All over the body.    Chief Complaint  Patient presents with  . Medical Management of Chronic Issues    3 month follow-up. Here with daughter   . Medication Management    Discuss if Mucinex is ok to take as needed   . Advanced Directive    Discuss different types of Advance Directives     HPI: Patient is a 85 y.o. female seen in the office today for routine follow up.  OAB- stopped myrbetriq and feels like there is not an issue with OAB  htn- does not check blood pressure at home. Taking losartan 50 mg by mouth daily, metoprolol 25 mg by mouth twice daily  LE EDEMA- uses lasix rarely if needed for swelling to lower legs.   DOE- followed up with Dr Melvyn Novas, stopped spiriva/symbicort and claritin, she has started Prilosec (omeprazole)  20 mg x 2 x 30 min  Take 30- 60 min before your first and last meals of the day and take pepcid ac (otc)  20 mg after supper. She is also using chlorpheniramine 4 mg as needed, reports cough is better   A fib- continues on eliquis twice daily for anticoagulation and metoprolol 25 mg BID>   Review of Systems:  Review of Systems  Constitutional: Negative for chills, fever and weight loss.  HENT: Negative for tinnitus.   Respiratory: Positive for  cough (improved) and shortness of breath (stable). Negative for sputum production.   Cardiovascular: Negative for chest pain, palpitations and leg swelling.  Gastrointestinal: Negative for abdominal pain, constipation, diarrhea and heartburn.  Genitourinary: Negative for dysuria, frequency and urgency.  Musculoskeletal: Negative for myalgias.  Skin: Negative.   Neurological: Negative for dizziness and headaches.  Psychiatric/Behavioral: Negative for memory loss. The patient does not have insomnia.     Past Medical History:  Diagnosis Date  . Aortic stenosis 07/20/2012   Aortic stenosis   . Aortic valve disorder 08/13/2011   ECHO - EF >69%; mild diastolic dysfunction; calcified aortic valve, not well visualized; mod/severe aortic stenosis w/ worsening gradients when compared to 2012  . Arthritis   . Cancer ([place])    Breast  . Carotid bruit 08/06/2008   Doppler - R ECA demonstrates noarrowing w/ elevated velocities consistent w/ >70% diameter reduction; R and L ICAs show no evidence of diameter reduction, significant tortuosity or vascular abnormality;   . Change in voice   . COPD (chronic obstructive pulmonary disease) ([place])   . Hearing loss   . Heart murmur   . Hyperlipidemia   . Hypertension    dr Gwenlyn Found  . Osteoporosis   . PAF (paroxysmal atrial fibrillation) ([place]) 09/04/2012  . PVD (peripheral vascular disease) ([place]) 08/13/2010   R/P MV - normal pattern of perfusion in all regions, EF 76%; no significant wall abnormalities noted; normal perfusion study  . Right carotid bruit    high-grade right external carotid artery  stenosis by 2 parts ultrasound 2 years ago  . S/P aortic valve replacement with bioprosthetic valve 08/30/2012   21 mm [facility] Ease bovine pericardial tissue valve   Past Surgical History:  Procedure Laterality Date  . ABDOMINAL HYSTERECTOMY     Partial  . AORTIC VALVE REPLACEMENT N/A 08/30/2012   Procedure: AORTIC VALVE REPLACEMENT (AVR);  Surgeon: Rexene Alberts, MD;  Location: [place];  Service: Open Heart Surgery;  Laterality: N/A;  . BIOPSY SHOULDER Left 10/08/2005   shave biopsy  . BREAST LUMPECTOMY  02/25/1997   right  . CARDIAC CATHETERIZATION    . [place]  . COSMETIC SURGERY Left 1997   Breast implant  . EYE SURGERY  1997   Cataract surgery  . INTRAOPERATIVE TRANSESOPHAGEAL ECHOCARDIOGRAM N/A 08/30/2012   Procedure: INTRAOPERATIVE TRANSESOPHAGEAL ECHOCARDIOGRAM;  Surgeon: Rexene Alberts, MD;  Location: [place];  Service: Open Heart Surgery;  Laterality: N/A;  . LEFT AND RIGHT HEART CATHETERIZATION WITH CORONARY ANGIOGRAM N/A 08/14/2012   Procedure: LEFT AND RIGHT HEART CATHETERIZATION WITH CORONARY ANGIOGRAM;  Surgeon: Lorretta Harp, MD;  [facility];  Service: Cardiovascular;  Laterality: N/A;  . LEFT HEART CATH  08/14/12   Nl cors, AS  . MASTECTOMY  09/29/95   left  . PARTIAL HYSTERECTOMY    . TOTAL HIP ARTHROPLASTY  09/2010   Social History:   reports that she has never smoked. She has never used smokeless tobacco. She reports that she does not drink alcohol or use drugs.  Family History  Problem Relation Age of Onset  . Cancer Mother        Bladder  . Early death Father        Tractor accident  . Early death Brother        Radiation protection practitioner  . Early death Brother        during heart surgery    Medications: Patient's Medications  New Prescriptions   No medications on file  Previous Medications   ACETAMINOPHEN (TYLENOL) 325 MG TABLET    Take 2 tablets (650 mg total) by mouth every 6 (six) hours as needed.   ALBUTEROL (PROAIR HFA) 108 (90 BASE) MCG/ACT INHALER    INHALE 1 PUFF INTO THE LUNGS EVERY 6 (SIX) HOURS AS NEEDED FOR WHEEZING OR SHORTNESS OF BREATH.   AMOXICILLIN (AMOXIL) 500 MG TABLET    Take 4 by mouth 1 hour prior to dental procedure   ASCORBIC ACID (VITAMIN C) 100 MG TABLET    Take 100 mg by mouth daily.   BIOTIN 5000 MCG CAPS    Take 1 capsule by mouth daily.   CALCIUM CITRATE-VITAMIN D  (CITRACAL + D PO)    Take 1 tablet by mouth 2 (two) times daily.    CHLORPHENIRAMINE MALEATE (EQ CHLORTABS PO)    Take 1 tablet by mouth every 4 (four) hours as needed.   DEXTROMETHORPHAN-GUAIFENESIN (MUCINEX DM) 30-600 MG 12HR TABLET    Take 1 tablet by mouth 2 (two) times daily.   ELIQUIS 2.5 MG TABS TABLET    TAKE 1 TABLET BY MOUTH TWICE A DAY   FAMOTIDINE (PEPCID) 20 MG TABLET    One after supper   FLUTICASONE (FLONASE) 50 MCG/ACT NASAL SPRAY    Place 1 spray into both nostrils 2 (two) times daily.   FUROSEMIDE (LASIX) 20 MG TABLET    Take 1 tablet (20 mg total) by mouth daily.   HYDROXYZINE (ATARAX/VISTARIL) 25 MG TABLET  Take one tablet by mouth daily as needed for itching.   LOSARTAN (COZAAR) 25 MG TABLET    TAKE 2 TABLETS (50 MG TOTAL) BY MOUTH DAILY. TAKE 2 TABS TO = 50 MG TOTAL   MAGNESIUM HYDROXIDE (PHILLIPS MILK OF MAGNESIA PO)    Take 15 mL by mouth. Take daily at 6 o'clock at night   METOPROLOL TARTRATE (LOPRESSOR) 25 MG TABLET    TAKE 1 TABLET (25 MG TOTAL) BY MOUTH 2 (TWO) TIMES DAILY.   OMEPRAZOLE (PRILOSEC) 20 MG CAPSULE    TAKE 2 CAPSULES BY MOUTH DAILY FOR REFLUX   SHARK LIVER OIL-COCOA BUTTER (PREPARATION H) 0.25-3-85.5 % SUPPOSITORY    Place 1 suppository rectally as needed.   VITAMIN E 400 UNIT CAPSULE    Take 400 Units by mouth daily.  Modified Medications   No medications on file  Discontinued Medications   SODIUM CHLORIDE (OCEAN) 0.65 % SOLN NASAL SPRAY    Place 1 spray into both nostrils as needed for congestion.    Physical Exam:  Vitals:   08/31/18 1454  BP: (!) 144/72  Pulse: 71  Temp: 98.8 F (37.1 C)  TempSrc: Oral  SpO2: 97%  Weight: 148 lb (67.1 kg)  Height: 5\' 3"  (1.6 m)   Body mass index is 26.22 kg/m. Wt Readings from Last 3 Encounters:  08/31/18 148 lb (67.1 kg)  08/22/18 153 lb 4.8 oz (69.5 kg)  05/03/18 153 lb 9.6 oz (69.7 kg)    Physical Exam Constitutional:      Appearance: Normal appearance. She is well-developed.  HENT:      Mouth/Throat:     Pharynx: No oropharyngeal exudate.  Eyes:     General: No scleral icterus.    Pupils: Pupils are equal, round, and reactive to light.  Neck:     Musculoskeletal: Neck supple.     Thyroid: No thyromegaly.     Vascular: No carotid bruit.     Trachea: No tracheal deviation.  Cardiovascular:     Rate and Rhythm: Normal rate. Rhythm irregularly irregular.     Heart sounds: Murmur (3/6 SEM) present. No friction rub. No gallop.   Pulmonary:     Effort: Pulmonary effort is normal. No respiratory distress.     Breath sounds: No stridor. Decreased breath sounds and rales present. No wheezing.  Chest:     Chest wall: No tenderness.  Abdominal:     General: Bowel sounds are normal. There is no distension.     Palpations: Abdomen is soft. Abdomen is not rigid. There is no hepatomegaly or mass.     Tenderness: There is no abdominal tenderness. There is no guarding or rebound.     Hernia: No hernia is present.     Comments: obese  Musculoskeletal:     Right lower leg: Edema present.     Left lower leg: Edema present.  Lymphadenopathy:     Cervical: No cervical adenopathy.  Skin:    General: Skin is warm and dry.     Findings: No rash.  Neurological:     Mental Status: She is alert and oriented to person, place, and time.     Deep Tendon Reflexes: Reflexes are normal and symmetric.  Psychiatric:        Behavior: Behavior normal.        Thought Content: Thought content normal.        Judgment: Judgment normal.     Comments: Voice is hoarse     Labs reviewed: Basic Metabolic  Panel: Recent Labs    04/12/18 0803 05/03/18 1618 08/22/18 1630  NA 142 141 138  K 5.0 4.8 3.9  CL 105 103 101  CO2 30 31 29   GLUCOSE 91 91 87  BUN 17 25 16   CREATININE 1.30* 1.22* 0.97  CALCIUM 9.9 10.2 9.9  TSH  --   --  1.13   Liver Function Tests: Recent Labs    09/16/17 0939 11/08/17 1603 04/12/18 0803  AST 68* 32 36*  ALT 72* 26 36*  BILITOT 0.5 0.7 0.6  PROT 6.0* 6.2  5.6*   No results for input(s): LIPASE, AMYLASE in the last 8760 hours. No results for input(s): AMMONIA in the last 8760 hours. CBC: Recent Labs    09/08/17 1625 09/16/17 0939 08/22/18 1630  WBC  --  7.0 7.2  NEUTROABS  --  4,158 4.4  HGB 11.9* 11.7 13.0  HCT 35.0* 35.3 38.5  MCV  --  94.1 97.9  PLT  --  262 165.0   Lipid Panel: Recent Labs    04/12/18 0803  CHOL 117  HDL 42*  LDLCALC 58  TRIG 89  CHOLHDL 2.8   TSH: Recent Labs    08/22/18 1630  TSH 1.13   A1C: Lab Results  Component Value Date   HGBA1C 5.5 08/28/2012     Assessment/Plan 1. Acute non-recurrent pansinusitis -no current issue, request refill.  - fluticasone (FLONASE) 50 MCG/ACT nasal spray; Place 1 spray into both nostrils 2 (two) times daily.  Dispense: 16 g; Refill: 1  2. Upper airway cough syndrome Following with pulmonary, cough has improved with current regimen.   3. CKD (chronic kidney disease) stage 3, GFR 30-59 ml/min (HCC) Encourage proper hydration and to avoid NSAIDS (Aleve, Advil, Motrin, Ibuprofen)   4. Overactive bladder Off myrbetriq, no worsening of symptoms.  5. Bilateral lower extremity edema Stable, uses lasix as needed  6. PAF (paroxysmal atrial fibrillation) (HCC) Rate controlled, continues on eliquis 2.5 mg BID and metoprolol BID  7. Essential hypertension Elevated in office, reports this is not uncommon for her and better at home. To check home bp and notify if bp staying over 140/90. DASH diet encouraged  8. Gastroesophageal reflux disease without esophagitis Controlled with omeprazole and pepcid.   Next appt: 12/21/2018 Carlos American. [place], [facility] [phone]

## 2018-08-31 NOTE — Patient Instructions (Signed)
Blood pressure goal <140/90  mucinex DM is okay to take twice daily as needed cough and congestion   DASH Eating Plan DASH stands for "Dietary Approaches to Stop Hypertension." The DASH eating plan is a healthy eating plan that has been shown to reduce high blood pressure (hypertension). It may also reduce your risk for type 2 diabetes, heart disease, and stroke. The DASH eating plan may also help with weight loss. What are tips for following this plan?  General guidelines  Avoid eating more than 2,300 mg (milligrams) of salt (sodium) a day. If you have hypertension, you may need to reduce your sodium intake to 1,500 mg a day.  Limit alcohol intake to no more than 1 drink a day for nonpregnant women and 2 drinks a day for men. One drink equals 12 oz of beer, 5 oz of wine, or 1 oz of hard liquor.  Work with your health care provider to maintain a healthy body weight or to lose weight. Ask what an ideal weight is for you.  Get at least 30 minutes of exercise that causes your heart to beat faster (aerobic exercise) most days of the week. Activities may include walking, swimming, or biking.  Work with your health care provider or diet and nutrition specialist (dietitian) to adjust your eating plan to your individual calorie needs. Reading food labels   Check food labels for the amount of sodium per serving. Choose foods with less than 5 percent of the Daily Value of sodium. Generally, foods with less than 300 mg of sodium per serving fit into this eating plan.  To find whole grains, look for the word "whole" as the first word in the ingredient list. Shopping  Buy products labeled as "low-sodium" or "no salt added."  Buy fresh foods. Avoid canned foods and premade or frozen meals. Cooking  Avoid adding salt when cooking. Use salt-free seasonings or herbs instead of table salt or sea salt. Check with your health care provider or pharmacist before using salt substitutes.  Do not fry  foods. Cook foods using healthy methods such as baking, boiling, grilling, and broiling instead.  Cook with heart-healthy oils, such as olive, canola, soybean, or sunflower oil. Meal planning  Eat a balanced diet that includes: ? 5 or more servings of fruits and vegetables each day. At each meal, try to fill half of your plate with fruits and vegetables. ? Up to 6-8 servings of whole grains each day. ? Less than 6 oz of lean meat, poultry, or fish each day. A 3-oz serving of meat is about the same size as a deck of cards. One egg equals 1 oz. ? 2 servings of low-fat dairy each day. ? A serving of nuts, seeds, or beans 5 times each week. ? Heart-healthy fats. Healthy fats called Omega-3 fatty acids are found in foods such as flaxseeds and coldwater fish, like sardines, salmon, and mackerel.  Limit how much you eat of the following: ? Canned or prepackaged foods. ? Food that is high in trans fat, such as fried foods. ? Food that is high in saturated fat, such as fatty meat. ? Sweets, desserts, sugary drinks, and other foods with added sugar. ? Full-fat dairy products.  Do not salt foods before eating.  Try to eat at least 2 vegetarian meals each week.  Eat more home-cooked food and less restaurant, buffet, and fast food.  When eating at a restaurant, ask that your food be prepared with less salt or no  salt, if possible. What foods are recommended? The items listed may not be a complete list. Talk with your dietitian about what dietary choices are best for you. Grains Whole-grain or whole-wheat bread. Whole-grain or whole-wheat pasta. Brown rice. [name]. Bulgur. Whole-grain and low-sodium cereals. Pita bread. Low-fat, low-sodium crackers. Whole-wheat flour tortillas. Vegetables Fresh or frozen vegetables (raw, steamed, roasted, or grilled). Low-sodium or reduced-sodium tomato and vegetable juice. Low-sodium or reduced-sodium tomato sauce and tomato paste. Low-sodium or  reduced-sodium canned vegetables. Fruits All fresh, dried, or frozen fruit. Canned fruit in natural juice (without added sugar). Meat and other protein foods Skinless chicken or [place]. Ground chicken or [place]. Pork with fat trimmed off. Fish and seafood. Egg whites. Dried beans, peas, or lentils. Unsalted nuts, nut butters, and seeds. Unsalted canned beans. Lean cuts of beef with fat trimmed off. Low-sodium, lean deli meat. Dairy Low-fat (1%) or fat-free (skim) milk. Fat-free, low-fat, or reduced-fat cheeses. Nonfat, low-sodium ricotta or cottage cheese. Low-fat or nonfat yogurt. Low-fat, low-sodium cheese. Fats and oils Soft margarine without trans fats. Vegetable oil. Low-fat, reduced-fat, or light mayonnaise and salad dressings (reduced-sodium). Canola, safflower, olive, soybean, and sunflower oils. Avocado. Seasoning and other foods Herbs. Spices. Seasoning mixes without salt. Unsalted popcorn and pretzels. Fat-free sweets. What foods are not recommended? The items listed may not be a complete list. Talk with your dietitian about what dietary choices are best for you. Grains Baked goods made with fat, such as croissants, muffins, or some breads. Dry pasta or rice meal packs. Vegetables Creamed or fried vegetables. Vegetables in a cheese sauce. Regular canned vegetables (not low-sodium or reduced-sodium). Regular canned tomato sauce and paste (not low-sodium or reduced-sodium). Regular tomato and vegetable juice (not low-sodium or reduced-sodium). [name]. Olives. Fruits Canned fruit in a light or heavy syrup. Fried fruit. Fruit in cream or butter sauce. Meat and other protein foods Fatty cuts of meat. Ribs. Fried meat. [name]. Sausage. Bologna and other processed lunch meats. Salami. Fatback. Hotdogs. Bratwurst. Salted nuts and seeds. Canned beans with added salt. Canned or smoked fish. Whole eggs or egg yolks. Chicken or [place] with skin. Dairy Whole or 2% milk, cream, and half-and-half.  Whole or full-fat cream cheese. Whole-fat or sweetened yogurt. Full-fat cheese. Nondairy creamers. Whipped toppings. Processed cheese and cheese spreads. Fats and oils Butter. Stick margarine. Lard. Shortening. Ghee. Bacon fat. Tropical oils, such as coconut, palm kernel, or palm oil. Seasoning and other foods Salted popcorn and pretzels. Onion salt, garlic salt, seasoned salt, table salt, and sea salt. Worcestershire sauce. Tartar sauce. Barbecue sauce. Teriyaki sauce. Soy sauce, including reduced-sodium. Steak sauce. Canned and packaged gravies. Fish sauce. Oyster sauce. Cocktail sauce. Horseradish that you find on the shelf. Ketchup. Mustard. Meat flavorings and tenderizers. Bouillon cubes. Hot sauce and Tabasco sauce. Premade or packaged marinades. Premade or packaged taco seasonings. Relishes. Regular salad dressings. Where to find more information:  National Heart, Lung, and [place]: https://wilson-eaton.com/  American Heart Association: www.heart.org Summary  The DASH eating plan is a healthy eating plan that has been shown to reduce high blood pressure (hypertension). It may also reduce your risk for type 2 diabetes, heart disease, and stroke.  With the DASH eating plan, you should limit salt (sodium) intake to 2,300 mg a day. If you have hypertension, you may need to reduce your sodium intake to 1,500 mg a day.  When on the DASH eating plan, aim to eat more fresh fruits and vegetables, whole grains, lean proteins, low-fat dairy, and heart-healthy  fats.  Work with your health care provider or diet and nutrition specialist (dietitian) to adjust your eating plan to your individual calorie needs. This information is not intended to replace advice given to you by your health care provider. Make sure you discuss any questions you have with your health care provider. Document Released: 02/04/2011 Document Revised: 01/28/2017 Document Reviewed: 02/09/2016 Elsevier Patient Education  2020  [employer].

## 2018-09-11 ENCOUNTER — Institutional Professional Consult (permissible substitution): Payer: Medicare Other | Admitting: Internal Medicine

## 2018-09-26 ENCOUNTER — Ambulatory Visit (INDEPENDENT_AMBULATORY_CARE_PROVIDER_SITE_OTHER): Payer: Medicare Other | Admitting: Internal Medicine

## 2018-09-26 ENCOUNTER — Other Ambulatory Visit: Payer: Self-pay

## 2018-09-26 ENCOUNTER — Encounter: Payer: Self-pay | Admitting: Internal Medicine

## 2018-09-26 DIAGNOSIS — R05 Cough: Secondary | ICD-10-CM

## 2018-09-26 DIAGNOSIS — R0609 Other forms of dyspnea: Secondary | ICD-10-CM

## 2018-09-26 DIAGNOSIS — R058 Other specified cough: Secondary | ICD-10-CM

## 2018-09-26 DIAGNOSIS — R0600 Dyspnea, unspecified: Secondary | ICD-10-CM

## 2018-09-26 NOTE — Patient Instructions (Addendum)
For cough /congestion> mucinex dm 1200 mg every 12 hours as needed   Ok to use symbicort but use it an hour before you go to [employer]    Work on inhaler technique:  relax and gently blow all the way out then take a nice smooth deep breath back in, triggering the inhaler at same time you start breathing in.  Hold for up to 5 seconds if you can. Blow out thru nose. Rinse and gargle with water when done   Pulmonary follow up is as needed but we will need to do PFTs which requires COVID 19

## 2018-09-26 NOTE — Progress Notes (Signed)
[name], female    DOB: 1931/05/23,     MRN: [id number]   Brief patient profile:  51 yowf never smoker  With onset doe house to mailbox x 2014 and no better p  AVR 2014 (though records suggest her breathing did improve) or spiriva, symbicort or saba assoc with noct cough so referred to pulmonary clinic 08/22/2018 by Sherrie Mustache   Was being followed by [facility] / Terie Purser Joya Gaskins since 1992 for "laryngel spasms" with botulinum injects most recent 07/14/18    History of Present Illness  08/22/2018  Pulmonary/ 1st office eval/[name]  Chief Complaint  Patient presents with  . Pulmonary Consult    Referred by Sherrie Mustache, NP. Pt c/o SOB since had heart surgery in 2014. She has occ cough when she lies down.   Dyspnea: slt uphill 50 ft and has to stop Cough: assoc with ex / candy helps but using peppermint  Sleep:p lie down x 5-10 min most nights stars coughing, helps to sit up  SABA use: once a day at most  Seeing Dr Joya Gaskins every 3-4 months with shots as above helps her voice and ? Her sob  for a month or two.  On  prilosec 20 mg daily 15 min ac  rec Prilosec (omeprazole)  20 mg x 2 x 30 min  Take 30- 60 min before your first and last meals of the day and take pepcid ac (otc)  20 mg after supper. If you are still having troubles with the nighttime cough > For drainage / throat tickle try take CHLORPHENIRAMINE  4 mg  (Chlortab 4mg   at [employer]  Stop spiriva / symbiocort and loratidine(clariton)  GERD  Please schedule a follow up office visit in 4 weeks, sooner if needed  with all medications /inhalers/ solutions in hand so we can verify exactly what you are taking. This includes all medications from all doctors and over the counters    09/26/2018  f/u ov/[name] re: vcd/living at home - did not bring all meds  Chief Complaint  Patient presents with  . Follow-up    Breathing has improved some. Her cough is unchanged.   Dyspnea:  slt uphill to mb s stopping using symbicort  when gets back as oob at that point  Cough: better with candy  Sleeping: fine most nights  SABA use: not using  02: none    No obvious day to day or daytime variability or assoc excess/ purulent sputum or mucus plugs or hemoptysis or cp or chest tightness, subjective wheeze or overt sinus or hb symptoms.   Sleeping flat  without nocturnal  or early am exacerbation  of respiratory  c/o's or need for noct saba. Also denies any obvious fluctuation of symptoms with weather or environmental changes or other aggravating or alleviating factors except as outlined above   No unusual exposure hx or h/o childhood pna/ asthma or knowledge of premature birth.  Current Allergies, Complete Past Medical History, Past Surgical History, Family History, and Social History were reviewed in [employer] record.  ROS  The following are not active complaints unless bolded Hoarseness, sore throat, dysphagia, dental problems, itching, sneezing,  nasal congestion or discharge of excess mucus or purulent secretions, ear ache,   fever, chills, sweats, unintended wt loss or wt gain, classically pleuritic or exertional cp,  orthopnea pnd or arm/hand swelling  or leg swelling, presyncope, palpitations, abdominal pain, anorexia, nausea, vomiting, diarrhea  or change in bowel habits or  change in bladder habits, change in stools or change in urine, dysuria, hematuria,  rash, arthralgias, visual complaints, headache, numbness, weakness or ataxia or problems with walking or coordination,  change in mood or  memory.        Current Meds  Medication Sig  . acetaminophen (TYLENOL) 325 MG tablet Take 2 tablets (650 mg total) by mouth every 6 (six) hours as needed.  Marland Kitchen albuterol (PROAIR HFA) 108 (90 Base) MCG/ACT inhaler INHALE 1 PUFF INTO THE LUNGS EVERY 6 (SIX) HOURS AS NEEDED FOR WHEEZING OR SHORTNESS OF BREATH.  Marland Kitchen Ascorbic Acid (VITAMIN C) 100 MG tablet Take 100 mg by mouth daily.  . Biotin 5000 MCG CAPS Take  1 capsule by mouth daily.  . budesonide-formoterol (SYMBICORT) 160-4.5 MCG/ACT inhaler Inhale 2 puffs into the lungs 2 (two) times daily.  . Calcium Citrate-Vitamin D (CITRACAL + D PO) Take 1 tablet by mouth 2 (two) times daily.   . Chlorpheniramine Maleate (EQ CHLORTABS PO) Take 1 tablet by mouth every 4 (four) hours as needed.  Marland Kitchen dextromethorphan-guaiFENesin (MUCINEX DM) 30-600 MG 12hr tablet Take 1 tablet by mouth 2 (two) times daily.  Marland Kitchen ELIQUIS 2.5 MG TABS tablet TAKE 1 TABLET BY MOUTH TWICE A DAY  . famotidine (PEPCID) 20 MG tablet One after supper  . fluticasone (FLONASE) 50 MCG/ACT nasal spray Place 1 spray into both nostrils 2 (two) times daily.  . hydrOXYzine (ATARAX/VISTARIL) 25 MG tablet Take one tablet by mouth daily as needed for itching.  . losartan (COZAAR) 25 MG tablet TAKE 2 TABLETS (50 MG TOTAL) BY MOUTH DAILY. TAKE 2 TABS TO = 50 MG TOTAL  . Magnesium Hydroxide (PHILLIPS MILK OF MAGNESIA PO) Take 15 mL by mouth. Take daily at 6 o'clock at night  . metoprolol tartrate (LOPRESSOR) 25 MG tablet TAKE 1 TABLET (25 MG TOTAL) BY MOUTH 2 (TWO) TIMES DAILY.  Marland Kitchen Omega-3 Fatty Acids (FISH OIL PO) Take 1 capsule by mouth daily.  Marland Kitchen omeprazole (PRILOSEC) 20 MG capsule TAKE 2 CAPSULES BY MOUTH DAILY FOR REFLUX  . shark liver oil-cocoa butter (PREPARATION H) 0.25-3-85.5 % suppository Place 1 suppository rectally as needed.  . vitamin E 400 UNIT capsule Take 400 Units by mouth daily.                Past Medical History:  Diagnosis Date  . Aortic stenosis 07/20/2012   Aortic stenosis   . Aortic valve disorder 08/13/2011   ECHO - EF >67%; mild diastolic dysfunction; calcified aortic valve, not well visualized; mod/severe aortic stenosis w/ worsening gradients when compared to 2012  . Arthritis   . Cancer ([place])    Breast  . Carotid bruit 08/06/2008   Doppler - R ECA demonstrates noarrowing w/ elevated velocities consistent w/ >70% diameter reduction; R and L ICAs show no evidence of  diameter reduction, significant tortuosity or vascular abnormality;   . Change in voice   . COPD (chronic obstructive pulmonary disease) ([place])   . Hearing loss   . Heart murmur   . Hyperlipidemia   . Hypertension    dr Gwenlyn Found  . Osteoporosis   . PAF (paroxysmal atrial fibrillation) ([place]) 09/04/2012  . PVD (peripheral vascular disease) ([place]) 08/13/2010   R/P MV - normal pattern of perfusion in all regions, EF 76%; no significant wall abnormalities noted; normal perfusion study  . Right carotid bruit    high-grade right external carotid artery stenosis by 2 parts ultrasound 2 years ago  . S/P aortic valve replacement with  bioprosthetic valve 08/30/2012   21 mm [facility] Ease bovine pericardial tissue valve       Objective:    amb wf nad   Wt Readings from Last 3 Encounters:  09/26/18 148 lb (67.1 kg)  08/31/18 148 lb (67.1 kg)  08/22/18 153 lb 4.8 oz (69.5 kg)     Vital signs reviewed - Note on arrival 02 sats  100% on RA        HEENT: nl dentition, turbinates bilaterally, and oropharynx. Nl external ear canals without cough reflex   NECK :  without JVD/Nodes/TM/ nl carotid upstrokes bilaterally   LUNGS: no acc muscle use,  Nl contour chest which is clear to A and P bilaterally without cough on insp or exp maneuvers   CV:  RRR  no s3   II-III/VI SEM  No  increase in P2, and no edema   ABD:  soft and nontender with nl inspiratory excursion in the supine position. No bruits or organomegaly appreciated, bowel sounds nl  MS:  Nl gait/ ext warm without deformities, calf tenderness, cyanosis or clubbing No obvious joint restrictions   SKIN: warm and dry without lesions    NEURO:  alert, approp, nl sensorium with  no motor or cerebellar deficits apparent.                      Assessment

## 2018-09-27 ENCOUNTER — Encounter: Payer: Self-pay | Admitting: Internal Medicine

## 2018-09-27 NOTE — Assessment & Plan Note (Addendum)
Onset 2014 improved p AVR 2014 assoc with VCD - 08/22/2018   Walked RA  2 laps @  approx 243ft each @ moderate  pace  stopped due to  End of study, sats still 98%  but "winded" at end   Convinced symbicort helps p exertion but has never tried prior and suggested she do explaining it may well be the resting that helps and not the symbicort which will give her 12 hour relief if there is a true asthmatic component here, which I frankly doubt  - The proper method of use, as well as anticipated side effects, of a metered-dose inhaler are discussed and demonstrated to the patient.    Next step in w/u is PFTs when  COVID - 19 restrictions have been lifted.

## 2018-09-27 NOTE — Assessment & Plan Note (Addendum)
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 /  IgE  60 RAST neg   Better with H1 and [place] candy > no change rx/ f/u Dr Joya Gaskins and here prn    I had an extended discussion with the patient reviewing all relevant studies completed to date and  lasting 15 to 20 minutes of a 25 minute visit    I performed detailed device teaching using a teach back method which extended face to face time for this visit (see above)  Each maintenance medication was reviewed in detail including emphasizing most importantly the difference between maintenance and prns and under what circumstances the prns are to be triggered using an action plan format that is not reflected in the computer generated alphabetically organized AVS which I have not found useful in most complex patients, especially with respiratory illnesses  Please see AVS for specific instructions unique to this visit that I personally wrote and verbalized to the the pt in detail and then reviewed with pt  by my nurse highlighting any  changes in therapy recommended at today's visit to their plan of care.

## 2018-10-03 ENCOUNTER — Ambulatory Visit
Admission: RE | Admit: 2018-10-03 | Discharge: 2018-10-03 | Disposition: A | Discharge status: Home / Self Care | Payer: Medicare Other | Source: Ambulatory Visit | Attending: Nurse Practitioner | Admitting: Nurse Practitioner

## 2018-10-03 ENCOUNTER — Other Ambulatory Visit: Payer: Self-pay

## 2018-10-03 DIAGNOSIS — Z1231 Encounter for screening mammogram for malignant neoplasm of breast: Secondary | ICD-10-CM

## 2018-10-03 DIAGNOSIS — Z780 Asymptomatic menopausal state: Secondary | ICD-10-CM | POA: Diagnosis not present

## 2018-10-03 DIAGNOSIS — M8589 Other specified disorders of bone density and structure, multiple sites: Secondary | ICD-10-CM | POA: Diagnosis not present

## 2018-10-03 DIAGNOSIS — E2839 Other primary ovarian failure: Secondary | ICD-10-CM

## 2018-10-04 ENCOUNTER — Other Ambulatory Visit: Payer: 59

## 2018-10-04 ENCOUNTER — Ambulatory Visit: Payer: 59

## 2018-10-17 ENCOUNTER — Telehealth: Payer: Self-pay | Admitting: Cardiovascular Disease

## 2018-10-17 NOTE — Telephone Encounter (Signed)
New Message     Patient calling the office for samples of medication:   1.  What medication and dosage are you requesting samples for? Eliquis 2.5mg     2.  Are you currently out of this medication?  Yes she is out

## 2018-10-17 NOTE — Telephone Encounter (Signed)
Returned call to daughter notified no samples at this time she will ask at her upcoming appt also

## 2018-10-17 NOTE — Telephone Encounter (Signed)
No samples at this time

## 2018-10-18 ENCOUNTER — Other Ambulatory Visit: Payer: Self-pay

## 2018-10-18 ENCOUNTER — Ambulatory Visit (INDEPENDENT_AMBULATORY_CARE_PROVIDER_SITE_OTHER): Payer: Medicare Other | Admitting: Cardiovascular Disease

## 2018-10-18 ENCOUNTER — Encounter: Payer: Self-pay | Admitting: Cardiovascular Disease

## 2018-10-18 ENCOUNTER — Telehealth: Payer: Self-pay

## 2018-10-18 VITALS — BP 136/78 | HR 68 | Temp 97.9°F | Ht 63.0 in | Wt 147.0 lb

## 2018-10-18 DIAGNOSIS — I10 Essential (primary) hypertension: Secondary | ICD-10-CM | POA: Diagnosis not present

## 2018-10-18 DIAGNOSIS — Z952 Presence of prosthetic heart valve: Secondary | ICD-10-CM

## 2018-10-18 DIAGNOSIS — I350 Nonrheumatic aortic (valve) stenosis: Secondary | ICD-10-CM | POA: Diagnosis not present

## 2018-10-18 DIAGNOSIS — R0609 Other forms of dyspnea: Secondary | ICD-10-CM | POA: Diagnosis not present

## 2018-10-18 DIAGNOSIS — I480 Paroxysmal atrial fibrillation: Secondary | ICD-10-CM | POA: Diagnosis not present

## 2018-10-18 DIAGNOSIS — R0600 Dyspnea, unspecified: Secondary | ICD-10-CM

## 2018-10-18 MED ORDER — APIXABAN 2.5 MG PO TABS: 2.5000 mg | ORAL_TABLET | Freq: Two times a day (BID) | ORAL | 0 refills | Status: DC

## 2018-10-18 NOTE — Assessment & Plan Note (Signed)
History of PAF now persistent, rate controlled on metoprolol and on Eliquis oral anticoagulation.

## 2018-10-18 NOTE — Patient Instructions (Signed)
Medication Instructions:  Your physician recommends that you continue on your current medications as directed. Please refer to the Current Medication list given to you today.  If you need a refill on your cardiac medications before your next appointment, please call your pharmacy.   Lab work: none If you have labs (blood work) drawn today and your tests are completely normal, you will receive your results only by: Marland Kitchen MyChart Message (if you have MyChart) OR . A paper copy in the mail If you have any lab test that is abnormal or we need to change your treatment, we will call you to review the results.  Testing/Procedures: Your physician has requested that you have an echocardiogram. Echocardiography is a painless test that uses sound waves to create images of your heart. It provides your doctor with information about the size and shape of your heart and how well your heart's chambers and valves are working. This procedure takes approximately one hour. There are no restrictions for this procedure. LOCATION: [facility]: [place], [place], [place] [address]   Follow-Up: At [facility], you and your health needs are our priority.  As part of our continuing mission to provide you with exceptional heart care, we have created designated Provider Care Teams.  These Care Teams include your primary Cardiologist (physician) and Advanced Practice Providers (APPs -  Physician Assistants and Nurse Practitioners) who all work together to provide you with the care you need, when you need it. You will need a follow up appointment in 12 months with Dr. Quay Burow.  Please call our office 2 months in advance to schedule this/each appointment.

## 2018-10-18 NOTE — Assessment & Plan Note (Signed)
History of critical aortic stenosis status post surgical AVR by Dr. Roxy Manns 2/14 with an Edwards magna ease pericardial tissue valve (21 mm) with an excellent result.  She had normal coronary arteries Prior to her valve replacement.  Most recent echo performed 10/12/2017 revealed normal LV systolic function, mild LVH with an intact well-functioning aortic bioprosthesis.  We will recheck a 2D echocardiogram.

## 2018-10-18 NOTE — Progress Notes (Signed)
10/18/2018 [name]   14-Nov-1931  [id number]  Primary Physician Lauree Chandler, NP Primary Cardiologist: Lorretta Harp MD Garret Reddish, [place], [place]  HPI:  [name] is a 85 y.o.   mildly overweight married Caucasian female who I last saw  08/05/2017.  She is accompanied by her daughter [name].  She is a mother of 2, grandmother to 1 grandchild. Her risk factors include hypertension and family history. A brother died at age 13 from heart-related issues while he was being operated on. She has never had a heart attack or stroke. She does have moderate aortic stenosis by 2D echocardiogram last performed a year ago with a valve area of 0.83 cm2, peak gradient of 57, and mean of 35. She had negative Myoview on August 13, 2010. Since I saw her last, she developed exertional jaw pain, which was fairly reproducible. Her last lipid profile a year ago was excellent with total cholesterol 166, LDL 89, HDL 44. 2-D echo was performed showed critical aortic stenosis the valve area 0.5 cm. Based on thisthe patient underwent right left heart cardiac catheterization by myself revealing normal coronaries and normal left function. She had critical aortic stenosis and ultimately underwent bioprosthetic aortic valve replacement by Dr. Roxy Manns on 08/30/12 with an Edwards magna ease pericardial tissue valve (21 mm) excellent result. Her postop course was complicated by nausea and paroxysmal atrial fibrillation for which she was treated with low dose amiodarone and Coumadin anticoagulation.  These drugs were ultimately discontinued.  Since I saw her a year ago she remains cardiovascular stable.  She continues to have dyspnea probably related to her COPD on inhaled bronchodilators followed by Dr. Melvyn Novas.  Her last 2D echo performed 10/12/2017 revealed normal LV systolic function, mild LVH with a well-functioning aortic bioprosthesis.  Current Meds  Medication Sig  . acetaminophen (TYLENOL) 325 MG tablet Take 2  tablets (650 mg total) by mouth every 6 (six) hours as needed.  Marland Kitchen albuterol (PROAIR HFA) 108 (90 Base) MCG/ACT inhaler INHALE 1 PUFF INTO THE LUNGS EVERY 6 (SIX) HOURS AS NEEDED FOR WHEEZING OR SHORTNESS OF BREATH.  Marland Kitchen amoxicillin (AMOXIL) 500 MG tablet Take 4 by mouth 1 hour prior to dental procedure  . Ascorbic Acid (VITAMIN C) 100 MG tablet Take 100 mg by mouth daily.  . Biotin 5000 MCG CAPS Take 1 capsule by mouth daily.  . budesonide-formoterol (SYMBICORT) 160-4.5 MCG/ACT inhaler Inhale 2 puffs into the lungs 2 (two) times daily.  . Calcium Citrate-Vitamin D (CITRACAL + D PO) Take 1 tablet by mouth 2 (two) times daily.   . Chlorpheniramine Maleate (EQ CHLORTABS PO) Take 1 tablet by mouth every 4 (four) hours as needed.  Marland Kitchen dextromethorphan-guaiFENesin (MUCINEX DM) 30-600 MG 12hr tablet Take 1 tablet by mouth 2 (two) times daily.  Marland Kitchen ELIQUIS 2.5 MG TABS tablet TAKE 1 TABLET BY MOUTH TWICE A DAY  . famotidine (PEPCID) 20 MG tablet One after supper  . fluticasone (FLONASE) 50 MCG/ACT nasal spray Place 1 spray into both nostrils 2 (two) times daily.  . hydrOXYzine (ATARAX/VISTARIL) 25 MG tablet Take one tablet by mouth daily as needed for itching.  . losartan (COZAAR) 25 MG tablet TAKE 2 TABLETS (50 MG TOTAL) BY MOUTH DAILY. TAKE 2 TABS TO = 50 MG TOTAL  . Magnesium Hydroxide (PHILLIPS MILK OF MAGNESIA PO) Take 15 mL by mouth. Take daily at 6 o'clock at night  . metoprolol tartrate (LOPRESSOR) 25 MG tablet TAKE 1 TABLET (25 MG  TOTAL) BY MOUTH 2 (TWO) TIMES DAILY.  Marland Kitchen Omega-3 Fatty Acids (FISH OIL PO) Take 1 capsule by mouth daily.  Marland Kitchen omeprazole (PRILOSEC) 20 MG capsule TAKE 2 CAPSULES BY MOUTH DAILY FOR REFLUX  . shark liver oil-cocoa butter (PREPARATION H) 0.25-3-85.5 % suppository Place 1 suppository rectally as needed.  . vitamin E 400 UNIT capsule Take 400 Units by mouth daily.     Allergies  Allergen Reactions  . Lisinopril Cough  . Codeine Rash    All over the body.    Social History    Socioeconomic History  . Marital status: Widowed    Spouse name: Not on file  . Number of children: Not on file  . Years of education: Not on file  . Highest education level: Not on file  Occupational History  . Not on file  Social Needs  . Financial resource strain: Not hard at all  . Food insecurity    Worry: Never true    Inability: Never true  . Transportation needs    Medical: No    Non-medical: No  Tobacco Use  . Smoking status: Never Smoker  . Smokeless tobacco: Never Used  Substance and Sexual Activity  . Alcohol use: No  . Drug use: No  . Sexual activity: Never  Lifestyle  . Physical activity    Days per week: 7 days    Minutes per session: 10 min  . Stress: Only a little  Relationships  . Social connections    Talks on phone: More than three times a week    Gets together: More than three times a week    Attends religious service: More than 4 times per year    Active member of club or organization: No    Attends meetings of clubs or organizations: Never    Relationship status: Widowed  . Intimate partner violence    Fear of current or ex partner: No    Emotionally abused: No    Physically abused: No    Forced sexual activity: No  Other Topics Concern  . Not on file  Social History Narrative  . Not on file     Review of Systems: General: negative for chills, fever, night sweats or weight changes.  Cardiovascular: negative for chest pain, dyspnea on exertion, edema, orthopnea, palpitations, paroxysmal nocturnal dyspnea or shortness of breath Dermatological: negative for rash Respiratory: negative for cough or wheezing Urologic: negative for hematuria Abdominal: negative for nausea, vomiting, diarrhea, bright red blood per rectum, melena, or hematemesis Neurologic: negative for visual changes, syncope, or dizziness All other systems reviewed and are otherwise negative except as noted above.    Blood pressure 136/78, pulse 68, temperature 97.9 F  (36.6 C), height 5\' 3"  (1.6 m), weight 147 lb (66.7 kg).  General appearance: alert and no distress Neck: no adenopathy, no carotid bruit, no JVD, supple, symmetrical, trachea midline and thyroid not enlarged, symmetric, no tenderness/mass/nodules Lungs: clear to auscultation bilaterally Heart: irregularly irregular rhythm Extremities: extremities normal, atraumatic, no cyanosis or edema Pulses: 2+ and symmetric Skin: Skin color, texture, turgor normal. No rashes or lesions Neurologic: Alert and oriented X 3, normal strength and tone. Normal symmetric reflexes. Normal coordination and gait  EKG atrial fibrillation with a ventricular spots of 68 and nonspecific ST and T wave changes.  I personally reviewed this EKG.  ASSESSMENT AND PLAN:   Essential hypertension History of essential hypertension her blood pressure measured today at 136/78.  She is on losartan and metoprolol.  Aortic stenosis- critical AS at cath 08/14/12, with surgery History of critical aortic stenosis status post surgical AVR by Dr. Roxy Manns 2/14 with an Edwards magna ease pericardial tissue valve (21 mm) with an excellent result.  She had normal coronary arteries Prior to her valve replacement.  Most recent echo performed 10/12/2017 revealed normal LV systolic function, mild LVH with an intact well-functioning aortic bioprosthesis.  We will recheck a 2D echocardiogram.  DOE (dyspnea on exertion) History of chronic dyspnea most likely not cardiovascular nature followed by Dr. Melvyn Novas .  She is on inhaled bronchodilators as well as antireflux measures.  PAF (paroxysmal atrial fibrillation) History of PAF now persistent, rate controlled on metoprolol and on Eliquis oral anticoagulation.      Lorretta Harp MD FACP,FACC,FAHA, [facility] 10/18/2018 2:08 PM

## 2018-10-18 NOTE — Assessment & Plan Note (Signed)
History of essential hypertension her blood pressure measured today at 136/78.  She is on losartan and metoprolol.

## 2018-10-18 NOTE — Assessment & Plan Note (Signed)
History of chronic dyspnea most likely not cardiovascular nature followed by Dr. Melvyn Novas .  She is on inhaled bronchodilators as well as antireflux measures.

## 2018-10-18 NOTE — Telephone Encounter (Signed)
Medication Samples have been provided to the patient.  Drug name: Eliquis    Strength: 2.5 mg        Qty: 1 box  LOT: [id number]  Exp.Date: SEP 2021   Denorris Reust O Mariko Nowakowski 2:25 PM 10/18/2018

## 2018-10-27 DIAGNOSIS — J385 Laryngeal spasm: Secondary | ICD-10-CM | POA: Diagnosis not present

## 2018-11-09 DIAGNOSIS — D485 Neoplasm of uncertain behavior of skin: Secondary | ICD-10-CM | POA: Diagnosis not present

## 2018-11-09 DIAGNOSIS — C44521 Squamous cell carcinoma of skin of breast: Secondary | ICD-10-CM | POA: Diagnosis not present

## 2018-11-09 DIAGNOSIS — L578 Other skin changes due to chronic exposure to nonionizing radiation: Secondary | ICD-10-CM | POA: Diagnosis not present

## 2018-11-13 ENCOUNTER — Ambulatory Visit (HOSPITAL_COMMUNITY): Payer: Medicare Other | Attending: Cardiovascular Disease

## 2018-11-13 ENCOUNTER — Other Ambulatory Visit: Payer: Self-pay

## 2018-11-13 DIAGNOSIS — Z952 Presence of prosthetic heart valve: Secondary | ICD-10-CM

## 2018-11-15 ENCOUNTER — Telehealth: Payer: Self-pay | Admitting: Cardiovascular Disease

## 2018-11-15 NOTE — Telephone Encounter (Signed)
New message   Patient is returning call for echo results. Please call. 

## 2018-11-15 NOTE — Telephone Encounter (Signed)
Patient made aware of results and verbalized understanding.   Notes recorded by Lorretta Harp, MD on 11/14/2018 at 8:00 AM EDT  Normal LV systolic function with a well-functioning aortic prosthesis, severe TR which may have progressed since last echo with normal right ventricular systolic pressure. Repeat 12 months

## 2018-11-22 DIAGNOSIS — C44521 Squamous cell carcinoma of skin of breast: Secondary | ICD-10-CM | POA: Diagnosis not present

## 2018-11-22 DIAGNOSIS — L905 Scar conditions and fibrosis of skin: Secondary | ICD-10-CM | POA: Diagnosis not present

## 2018-11-28 ENCOUNTER — Telehealth: Payer: Self-pay | Admitting: Internal Medicine

## 2018-12-04 NOTE — Telephone Encounter (Signed)
pft scheduled 11/17-pr

## 2018-12-06 DIAGNOSIS — Z85828 Personal history of other malignant neoplasm of skin: Secondary | ICD-10-CM | POA: Diagnosis not present

## 2018-12-06 DIAGNOSIS — L821 Other seborrheic keratosis: Secondary | ICD-10-CM | POA: Diagnosis not present

## 2018-12-06 DIAGNOSIS — L905 Scar conditions and fibrosis of skin: Secondary | ICD-10-CM | POA: Diagnosis not present

## 2018-12-20 ENCOUNTER — Other Ambulatory Visit: Payer: Self-pay

## 2018-12-20 ENCOUNTER — Ambulatory Visit (INDEPENDENT_AMBULATORY_CARE_PROVIDER_SITE_OTHER): Payer: Medicare Other | Admitting: Nurse Practitioner

## 2018-12-20 ENCOUNTER — Encounter: Payer: Self-pay | Admitting: Nurse Practitioner

## 2018-12-20 VITALS — BP 148/70 | HR 71 | Temp 97.5°F | Resp 20 | Ht 63.0 in | Wt 142.6 lb

## 2018-12-20 DIAGNOSIS — I480 Paroxysmal atrial fibrillation: Secondary | ICD-10-CM

## 2018-12-20 DIAGNOSIS — R05 Cough: Secondary | ICD-10-CM

## 2018-12-20 DIAGNOSIS — N3281 Overactive bladder: Secondary | ICD-10-CM | POA: Diagnosis not present

## 2018-12-20 DIAGNOSIS — N183 Chronic kidney disease, stage 3 unspecified: Secondary | ICD-10-CM

## 2018-12-20 DIAGNOSIS — I10 Essential (primary) hypertension: Secondary | ICD-10-CM | POA: Diagnosis not present

## 2018-12-20 DIAGNOSIS — Z23 Encounter for immunization: Secondary | ICD-10-CM

## 2018-12-20 DIAGNOSIS — K219 Gastro-esophageal reflux disease without esophagitis: Secondary | ICD-10-CM | POA: Diagnosis not present

## 2018-12-20 DIAGNOSIS — R058 Other specified cough: Secondary | ICD-10-CM

## 2018-12-20 NOTE — Progress Notes (Signed)
Careteam: Patient Care Team: Lauree Chandler, NP as PCP - General (Geriatric Medicine) Druscilla Brownie, MD as Consulting Physician (Dermatology) [name] Irani, MD (Inactive) as Consulting Physician (Gastroenterology) Neldon Mc, MD as Consulting Physician (General Surgery) Lorretta Harp, MD as Consulting Physician (Cardiology) Bjorn Loser, MD as Consulting Physician (Urology) Lavell Anchors, MD as Consulting Physician (Ophthalmology)  Advanced Directive information    Allergies  Allergen Reactions  . Lisinopril Cough  . Codeine Rash    All over the body.    Chief Complaint  Patient presents with  . Medical Management of Chronic Issues    4 month follow up   . Immunizations    Flu shot     HPI: Patient is a 85 y.o. female seen in the office today for routine follow up.   OAB- stopped myrbetriq and feels like there is not an issue with OAB  htn- - reports blood pressure is 130/60s at home Taking losartan 50 mg by mouth daily, metoprolol 25 mg by mouth twice daily  Le edema- rarely uses lasix if needed for swelling to lower legs.   DOE/chronic cough - using omeprazole and pepcid routinely, She is not sure if she is using chlorpheniramine 4 mg as needed, however overall reports cough is better. PFT scheduled for November.   A fib- continues on eliquis twice daily for anticoagulation and metoprolol 25 mg BID. Stable on current medication.   GERD- using Pepcid and omeprazole with good relief   Review of Systems:  Review of Systems  Constitutional: Negative for chills, fever and weight loss.  HENT: Negative for tinnitus.   Respiratory: Positive for cough (improved). Negative for sputum production and shortness of breath.   Cardiovascular: Negative for chest pain, palpitations and leg swelling.  Gastrointestinal: Negative for abdominal pain, constipation, diarrhea and heartburn.  Genitourinary: Negative for dysuria, frequency and urgency.   Musculoskeletal: Positive for joint pain (in legs, uses tylenol which is benefitical ). Negative for back pain, falls and myalgias.  Skin: Negative.   Neurological: Negative for dizziness and headaches.  Psychiatric/Behavioral: Negative for depression and memory loss. The patient does not have insomnia.     Past Medical History:  Diagnosis Date  . Aortic stenosis 07/20/2012   Aortic stenosis   . Aortic valve disorder 08/13/2011   ECHO - EF 123456; mild diastolic dysfunction; calcified aortic valve, not well visualized; mod/severe aortic stenosis w/ worsening gradients when compared to 2012  . Arthritis   . Cancer ([place])    Breast  . Carotid bruit 08/06/2008   Doppler - R ECA demonstrates noarrowing w/ elevated velocities consistent w/ >70% diameter reduction; R and L ICAs show no evidence of diameter reduction, significant tortuosity or vascular abnormality;   . Change in voice   . COPD (chronic obstructive pulmonary disease) ([place])   . Hearing loss   . Heart murmur   . Hyperlipidemia   . Hypertension    dr Gwenlyn Found  . Osteoporosis   . PAF (paroxysmal atrial fibrillation) ([place]) 09/04/2012  . PVD (peripheral vascular disease) ([place]) 08/13/2010   R/P MV - normal pattern of perfusion in all regions, EF 76%; no significant wall abnormalities noted; normal perfusion study  . Right carotid bruit    high-grade right external carotid artery stenosis by 2 parts ultrasound 2 years ago  . S/P aortic valve replacement with bioprosthetic valve 08/30/2012   21 mm [facility] Ease bovine pericardial tissue valve   Past Surgical History:  Procedure Laterality Date  .  ABDOMINAL HYSTERECTOMY     Partial  . AORTIC VALVE REPLACEMENT N/A 08/30/2012   Procedure: AORTIC VALVE REPLACEMENT (AVR);  Surgeon: Rexene Alberts, MD;  Location: [facility];  Service: Open Heart Surgery;  Laterality: N/A;  . BIOPSY SHOULDER Left 10/08/2005   shave biopsy  . BREAST LUMPECTOMY  02/25/1997   right  . CARDIAC CATHETERIZATION    .  [place]  . COSMETIC SURGERY Left 1997   Breast implant  . EYE SURGERY  1997   Cataract surgery  . INTRAOPERATIVE TRANSESOPHAGEAL ECHOCARDIOGRAM N/A 08/30/2012   Procedure: INTRAOPERATIVE TRANSESOPHAGEAL ECHOCARDIOGRAM;  Surgeon: Rexene Alberts, MD;  Location: [facility];  Service: Open Heart Surgery;  Laterality: N/A;  . LEFT AND RIGHT HEART CATHETERIZATION WITH CORONARY ANGIOGRAM N/A 08/14/2012   Procedure: LEFT AND RIGHT HEART CATHETERIZATION WITH CORONARY ANGIOGRAM;  Surgeon: Lorretta Harp, MD;  Location: [facility] CATH LAB;  Service: Cardiovascular;  Laterality: N/A;  . LEFT HEART CATH  08/14/12   Nl cors, AS  . MASTECTOMY  09/29/95   left  . PARTIAL HYSTERECTOMY    . TOTAL HIP ARTHROPLASTY  09/2010   Social History:   reports that she has never smoked. She has never used smokeless tobacco. She reports that she does not drink alcohol or use drugs.  Family History  Problem Relation Age of Onset  . Cancer Mother        Bladder  . Early death Father        Tractor accident  . Early death Brother        Radiation protection practitioner  . Early death Brother        during heart surgery    Medications: Patient's Medications  New Prescriptions   No medications on file  Previous Medications   ACETAMINOPHEN (TYLENOL) 325 MG TABLET    Take 650 mg by mouth at bedtime.   ACETAMINOPHEN (TYLENOL) 650 MG CR TABLET    Take 650 mg by mouth. 1 a day every morning   ALBUTEROL (PROAIR HFA) 108 (90 BASE) MCG/ACT INHALER    INHALE 1 PUFF INTO THE LUNGS EVERY 6 (SIX) HOURS AS NEEDED FOR WHEEZING OR SHORTNESS OF BREATH.   AMOXICILLIN (AMOXIL) 500 MG TABLET    Take 4 by mouth 1 hour prior to dental procedure   APIXABAN (ELIQUIS) 2.5 MG TABS TABLET    Take 1 tablet (2.5 mg total) by mouth 2 (two) times daily.   ASCORBIC ACID (VITAMIN C) 100 MG TABLET    Take 100 mg by mouth daily.   BIOTIN 5000 MCG CAPS    Take 1 capsule by mouth daily.   BUDESONIDE-FORMOTEROL (SYMBICORT) 160-4.5 MCG/ACT INHALER    Inhale 2 puffs into  the lungs 2 (two) times daily.   CALCIUM CITRATE-VITAMIN D (CITRACAL + D PO)    Take 1 tablet by mouth 2 (two) times daily.    CHLORPHENIRAMINE MALEATE (EQ CHLORTABS PO)    Take 1 tablet by mouth every 4 (four) hours as needed.   DEXTROMETHORPHAN-GUAIFENESIN (MUCINEX DM) 30-600 MG 12HR TABLET    Take 1 tablet by mouth 2 (two) times daily.   FAMOTIDINE (PEPCID) 20 MG TABLET    One after supper   FLUTICASONE (FLONASE) 50 MCG/ACT NASAL SPRAY    Place 1 spray into both nostrils 2 (two) times daily.   HYDROXYZINE (ATARAX/VISTARIL) 25 MG TABLET    Take one tablet by mouth daily as needed for itching.   LOSARTAN (COZAAR) 25 MG TABLET  TAKE 2 TABLETS (50 MG TOTAL) BY MOUTH DAILY. TAKE 2 TABS TO = 50 MG TOTAL   MAGNESIUM HYDROXIDE (PHILLIPS MILK OF MAGNESIA PO)    Take 15 mL by mouth. Take daily at 6 o'clock at night   METOPROLOL TARTRATE (LOPRESSOR) 25 MG TABLET    TAKE 1 TABLET (25 MG TOTAL) BY MOUTH 2 (TWO) TIMES DAILY.   OMEGA-3 FATTY ACIDS (FISH OIL PO)    Take 1 capsule by mouth daily.   OMEPRAZOLE (PRILOSEC) 20 MG CAPSULE    TAKE 2 CAPSULES BY MOUTH DAILY FOR REFLUX   SHARK LIVER OIL-COCOA BUTTER (PREPARATION H) 0.25-3-85.5 % SUPPOSITORY    Place 1 suppository rectally as needed.   VITAMIN E 400 UNIT CAPSULE    Take 400 Units by mouth daily.  Modified Medications   No medications on file  Discontinued Medications   ACETAMINOPHEN (TYLENOL) 325 MG TABLET    Take 2 tablets (650 mg total) by mouth every 6 (six) hours as needed.    Physical Exam:  Vitals:   12/20/18 1531  BP: (!) 148/70  Pulse: 71  Resp: 20  Temp: (!) 97.5 F (36.4 C)  TempSrc: Oral  SpO2: 98%  Weight: 142 lb 9.6 oz (64.7 kg)  Height: 5\' 3"  (1.6 m)   Body mass index is 25.26 kg/m. Wt Readings from Last 3 Encounters:  12/20/18 142 lb 9.6 oz (64.7 kg)  10/18/18 147 lb (66.7 kg)  09/26/18 148 lb (67.1 kg)   Physical Exam  Constitutional: She is oriented to person, place, and time. She appears well-developed and  well-nourished.  HENT:  Head: Normocephalic and atraumatic.  Eyes: Pupils are equal, round, and reactive to light.  Cardiovascular: An irregular rhythm present.  Murmur heard. Respiratory: Effort normal and breath sounds normal.  GI: Soft. Bowel sounds are normal.  Musculoskeletal: Normal range of motion.        General: No edema.  Neurological: She is alert and oriented to person, place, and time.  Skin: Skin is warm and dry.  Psychiatric: She has a normal mood and affect.    Labs reviewed: Basic Metabolic Panel: Recent Labs    04/12/18 0803 05/03/18 1618 08/22/18 1630  NA 142 141 138  K 5.0 4.8 3.9  CL 105 103 101  CO2 30 31 29   GLUCOSE 91 91 87  BUN 17 25 16   CREATININE 1.30* 1.22* 0.97  CALCIUM 9.9 10.2 9.9  TSH  --   --  1.13   Liver Function Tests: Recent Labs    04/12/18 0803  AST 36*  ALT 36*  BILITOT 0.6  PROT 5.6*   No results for input(s): LIPASE, AMYLASE in the last 8760 hours. No results for input(s): AMMONIA in the last 8760 hours. CBC: Recent Labs    08/22/18 1630  WBC 7.2  NEUTROABS 4.4  HGB 13.0  HCT 38.5  MCV 97.9  PLT 165.0   Lipid Panel: Recent Labs    04/12/18 0803  CHOL 117  HDL 42*  LDLCALC 58  TRIG 89  CHOLHDL 2.8   TSH: Recent Labs    08/22/18 1630  TSH 1.13   A1C: Lab Results  Component Value Date   HGBA1C 5.5 08/28/2012     Assessment/Plan 1. Need for immunization against influenza - Flu Vaccine QUAD High Dose(Fluad)  2. Stage 3 chronic kidney disease, unspecified whether stage 3a or 3b CKD -Encourage proper hydration and to avoid NSAIDS (Aleve, Advil, Motrin, Ibuprofen)  - CBC with Differential/Platelet - COMPLETE  METABOLIC PANEL WITH GFR  3. PAF (paroxysmal atrial fibrillation) ([place]) Rate controlled, continues on eliquis for anticoagulation - CBC with Differential/Platelet - COMPLETE METABOLIC PANEL WITH GFR  4. Upper airway cough syndrome Improved with treatment for GERD, also having PFTS done in  November. Uses mucinex DM as needed. Using Symbicort occasionally, does not need routinely   5. Overactive bladder -stable at this time. Not currently using medication. Continue with scheduled toileting.    6. Essential hypertension -blood pressures are improved at home. Will continue current regimen at this time - Lipid panel; Future - COMPLETE METABOLIC PANEL WITH GFR; Future - CBC with Differential/Platelet; Future  7. Gastroesophageal reflux disease without esophagitis Stable on prilosec and pepcid with dietary modifications.   Next appt: 6 months with labs prior  Camree Wigington K. [place], [facility] [phone]

## 2018-12-21 ENCOUNTER — Telehealth: Payer: Self-pay

## 2018-12-21 ENCOUNTER — Ambulatory Visit: Payer: Medicare Other | Admitting: Nurse Practitioner

## 2018-12-21 LAB — COMPLETE METABOLIC PANEL WITH GFR
AG Ratio: 2.4 (calc) (ref 1.0–2.5)
ALT: 14 U/L (ref 6–29)
AST: 21 U/L (ref 10–35)
Albumin: 4.1 g/dL (ref 3.6–5.1)
Alkaline phosphatase (APISO): 53 U/L (ref 37–153)
BUN/Creatinine Ratio: 13 (calc) (ref 6–22)
BUN: 16 mg/dL (ref 7–25)
CO2: 28 mmol/L (ref 20–32)
Calcium: 10.2 mg/dL (ref 8.6–10.4)
Chloride: 102 mmol/L (ref 98–110)
Creat: 1.23 mg/dL — ABNORMAL HIGH (ref 0.60–0.88)
GFR, Est African American: 46 mL/min/{1.73_m2} — ABNORMAL LOW (ref >=60)
GFR, Est Non African American: 40 mL/min/{1.73_m2} — ABNORMAL LOW (ref >=60)
Globulin: 1.7 g/dL (calc) — ABNORMAL LOW (ref 1.9–3.7)
Glucose, Bld: 94 mg/dL (ref 65–139)
Potassium: 4.6 mmol/L (ref 3.5–5.3)
Sodium: 137 mmol/L (ref 135–146)
Total Bilirubin: 0.6 mg/dL (ref 0.2–1.2)
Total Protein: 5.8 g/dL — ABNORMAL LOW (ref 6.1–8.1)

## 2018-12-21 LAB — CBC WITH DIFFERENTIAL/PLATELET
Absolute Monocytes: 468 cells/uL (ref 200–950)
Basophils Absolute: 43 cells/uL (ref 0–200)
Basophils Relative: 0.6 %
Eosinophils Absolute: 108 cells/uL (ref 15–500)
Eosinophils Relative: 1.5 %
HCT: 36.3 % (ref 35.0–45.0)
Hemoglobin: 12.3 g/dL (ref 11.7–15.5)
Lymphs Abs: 2023 cells/uL (ref 850–3900)
MCH: 32.9 pg (ref 27.0–33.0)
MCHC: 33.9 g/dL (ref 32.0–36.0)
MCV: 97.1 fL (ref 80.0–100.0)
MPV: 10.6 fL (ref 7.5–12.5)
Monocytes Relative: 6.5 %
Neutro Abs: 4558 cells/uL (ref 1500–7800)
Neutrophils Relative %: 63.3 %
Platelets: 159 10*3/uL (ref 140–400)
RBC: 3.74 10*6/uL — ABNORMAL LOW (ref 3.80–5.10)
RDW: 13.1 % (ref 11.0–15.0)
Total Lymphocyte: 28.1 %
WBC: 7.2 10*3/uL (ref 3.8–10.8)

## 2018-12-21 NOTE — Telephone Encounter (Signed)
Noted thank you

## 2018-12-21 NOTE — Telephone Encounter (Signed)
Patients daughter [name] called to discuss lab results for patient did not understand lab results when discussed earlier with Narka, refer to labs dated 12/20/2018.  Patients daughter asked for specific protein recommendations from Lauree Chandler, NP   I did a Internet search for foods high in protein and shared with patients daughter. I also asked if we should changed the point of contact since patient does not understand labs and Kimi responded no, lets keep it as is.

## 2019-01-01 ENCOUNTER — Telehealth: Payer: Self-pay | Admitting: Internal Medicine

## 2019-01-02 NOTE — Telephone Encounter (Signed)
ATC pt' daughter no answer. Left message for her to call back.

## 2019-01-02 NOTE — Telephone Encounter (Signed)
Pt daughter returning phone call

## 2019-01-02 NOTE — Telephone Encounter (Signed)
Called spoke with patient. She is aware of the covid test on 01/12/19. Spoke with daughter to let her know patient was set up and aware of the date and time.  Nothing further needed at this time.

## 2019-01-03 ENCOUNTER — Telehealth: Payer: Self-pay | Admitting: Cardiovascular Disease

## 2019-01-03 ENCOUNTER — Other Ambulatory Visit: Payer: Self-pay | Admitting: *Deleted

## 2019-01-03 MED ORDER — LOSARTAN POTASSIUM 25 MG PO TABS: 50.0000 mg | ORAL_TABLET | Freq: Every day | ORAL | 1 refills | Status: DC

## 2019-01-03 MED ORDER — OMEPRAZOLE 20 MG PO CPDR: DELAYED_RELEASE_CAPSULE | ORAL | 1 refills | Status: DC

## 2019-01-03 MED ORDER — HYDROXYZINE HCL 25 MG PO TABS: ORAL_TABLET | ORAL | 3 refills | Status: DC

## 2019-01-03 MED ORDER — METOPROLOL TARTRATE 25 MG PO TABS: 25.0000 mg | ORAL_TABLET | Freq: Two times a day (BID) | ORAL | 3 refills | Status: DC

## 2019-01-03 MED FILL — OMEPRAZOLE 20 MG CAPSULE DR: 20 | 90 days supply | Qty: 180 | Fill #0

## 2019-01-03 NOTE — Telephone Encounter (Signed)
New Message:    Daughter called and said pt is changing pharmacy. She will now be using [facility] on [address]

## 2019-01-03 NOTE — Telephone Encounter (Signed)
Daughter, [name] called and stated that patient wants to switch pharmacy to [facility] and have her Rx's faxed there. Rx's we refill faxed but daughter and patient informed that they would also need to call the Cardiologist office to have the others filled. They agreed.    Pended Rx and sent to [facility] for approval due to [place].

## 2019-01-03 NOTE — Telephone Encounter (Signed)
Changed pharmacy and refilled Metoprolol

## 2019-01-03 NOTE — Telephone Encounter (Signed)
Follow Up:      Pt's daughter is returning your call.

## 2019-01-03 NOTE — Telephone Encounter (Signed)
Daughter wanting Eliquis sent to [facility].  Will forward to [facility] to be sent

## 2019-01-04 ENCOUNTER — Telehealth: Payer: Self-pay | Admitting: *Deleted

## 2019-01-04 MED ORDER — APIXABAN 5 MG PO TABS: 5.0000 mg | ORAL_TABLET | Freq: Two times a day (BID) | ORAL | 1 refills | Status: DC

## 2019-01-04 MED FILL — ELIQUIS 5 MG TABLET: 5 | 90 days supply | Qty: 180 | Fill #0

## 2019-01-04 NOTE — Telephone Encounter (Signed)
Left message at PCP office.   They are usual prescribers for Eliquis. Patient should be on Eliquis 5mg  twice daily instead of 2.5mg  twice daily.   Waiting for call back to discuss reasons for low dose.

## 2019-01-04 NOTE — Telephone Encounter (Signed)
Called and notified the pt's daughter that the dose was changed to 5mg . Instructed the pt two finish out the 2.5 but to double up to be at the correct dose. The pt's daughter voiced understanding. New rx for eliquis 5mg  sent to [facility]

## 2019-01-04 NOTE — Telephone Encounter (Signed)
[name] with [facility] office is calling stating that patient is requesting refill of Eliquis from their office and she is calling to confirm the dosage. Please Advise.   Should it be 2.5mg  twice daily or 5mg  twice daily.

## 2019-01-04 NOTE — Telephone Encounter (Signed)
Spoke with [name] and they will refill medication.

## 2019-01-04 NOTE — Telephone Encounter (Signed)
Dr Gwenlyn Found placed her on the 2.5 twice daily dose back in July of 2019 and he has been the one refilling. We can send in refill at this time if needed.

## 2019-01-10 ENCOUNTER — Telehealth: Payer: Self-pay

## 2019-01-10 ENCOUNTER — Telehealth: Payer: Self-pay | Admitting: Cardiovascular Disease

## 2019-01-10 ENCOUNTER — Encounter: Payer: Self-pay | Admitting: Cardiovascular Disease

## 2019-01-10 NOTE — Telephone Encounter (Signed)
error 

## 2019-01-10 NOTE — Telephone Encounter (Signed)
Patients daughter Christene Lye called to see if we had any samples of Eliquis 5 mg but I informed her we did not I asked her if her mother needed a refill on her medication she said no but it never hurts to have some samples on hand

## 2019-01-10 NOTE — Telephone Encounter (Signed)
Patient's daughter would like to know why they increased her Eliquis from 2.5mg  to 5mg .

## 2019-01-10 NOTE — Telephone Encounter (Signed)
Called and explained to the pt why they are are the higher dose. Pt voiced understanding

## 2019-01-12 ENCOUNTER — Other Ambulatory Visit (HOSPITAL_COMMUNITY)
Admission: RE | Admit: 2019-01-12 | Discharge: 2019-01-12 | Disposition: A | Discharge status: Home / Self Care | Payer: Medicare Other | Source: Ambulatory Visit | Attending: Internal Medicine | Admitting: Internal Medicine

## 2019-01-12 DIAGNOSIS — Z01812 Encounter for preprocedural laboratory examination: Secondary | ICD-10-CM | POA: Insufficient documentation

## 2019-01-12 DIAGNOSIS — Z20828 Contact with and (suspected) exposure to other viral communicable diseases: Secondary | ICD-10-CM | POA: Diagnosis not present

## 2019-01-14 LAB — NOVEL CORONAVIRUS, NAA (HOSP ORDER, SEND-OUT TO REF LAB; TAT 18-24 HRS): SARS-CoV-2, NAA: NOT DETECTED

## 2019-01-15 ENCOUNTER — Other Ambulatory Visit: Payer: Self-pay | Admitting: *Deleted

## 2019-01-15 DIAGNOSIS — R0600 Dyspnea, unspecified: Secondary | ICD-10-CM

## 2019-01-15 DIAGNOSIS — J449 Chronic obstructive pulmonary disease, unspecified: Secondary | ICD-10-CM

## 2019-01-15 DIAGNOSIS — R0609 Other forms of dyspnea: Secondary | ICD-10-CM

## 2019-01-15 NOTE — Progress Notes (Signed)
Spoke with pt and notified of results per Dr. Wert. Pt verbalized understanding and denied any questions. 

## 2019-01-16 ENCOUNTER — Ambulatory Visit (INDEPENDENT_AMBULATORY_CARE_PROVIDER_SITE_OTHER): Payer: Medicare Other | Admitting: Internal Medicine

## 2019-01-16 ENCOUNTER — Other Ambulatory Visit: Payer: Self-pay

## 2019-01-16 ENCOUNTER — Ambulatory Visit: Payer: Medicare Other | Admitting: Internal Medicine

## 2019-01-16 DIAGNOSIS — J449 Chronic obstructive pulmonary disease, unspecified: Secondary | ICD-10-CM

## 2019-01-16 DIAGNOSIS — R0609 Other forms of dyspnea: Secondary | ICD-10-CM

## 2019-01-16 DIAGNOSIS — R0600 Dyspnea, unspecified: Secondary | ICD-10-CM

## 2019-01-16 LAB — PULMONARY FUNCTION TEST
DL/VA % pred: 118 %
DL/VA: 4.79 ml/min/mmHg/L
DLCO unc % pred: 94 %
DLCO unc: 17.22 ml/min/mmHg
FEF 25-75 Post: 1.03 L/sec
FEF 25-75 Pre: 0.69 L/sec
FEF2575-%Change-Post: 48 %
FEF2575-%Pred-Post: 92 %
FEF2575-%Pred-Pre: 62 %
FEV1-%Change-Post: 13 %
FEV1-%Pred-Post: 68 %
FEV1-%Pred-Pre: 60 %
FEV1-Post: 1.18 L
FEV1-Pre: 1.04 L
FEV1FVC-%Change-Post: 3 %
FEV1FVC-%Pred-Pre: 94 %
FEV6-%Change-Post: 11 %
FEV6-%Pred-Post: 75 %
FEV6-%Pred-Pre: 68 %
FEV6-Post: 1.65 L
FEV6-Pre: 1.49 L
FEV6FVC-%Change-Post: 1 %
FEV6FVC-%Pred-Post: 106 %
FEV6FVC-%Pred-Pre: 105 %
FVC-%Change-Post: 9 %
FVC-%Pred-Post: 70 %
FVC-%Pred-Pre: 64 %
FVC-Post: 1.65 L
FVC-Pre: 1.5 L
Post FEV1/FVC ratio: 71 %
Post FEV6/FVC ratio: 100 %
Pre FEV1/FVC ratio: 69 %
Pre FEV6/FVC Ratio: 99 %
RV % pred: 101 %
RV: 2.51 L
TLC % pred: 83 %
TLC: 4.16 L

## 2019-01-16 NOTE — Progress Notes (Signed)
Full PFT performed today. °

## 2019-01-22 ENCOUNTER — Ambulatory Visit (INDEPENDENT_AMBULATORY_CARE_PROVIDER_SITE_OTHER): Payer: Medicare Other | Admitting: Internal Medicine

## 2019-01-22 ENCOUNTER — Other Ambulatory Visit: Payer: Self-pay

## 2019-01-22 ENCOUNTER — Encounter: Payer: Self-pay | Admitting: Internal Medicine

## 2019-01-22 DIAGNOSIS — J45991 Cough variant asthma: Secondary | ICD-10-CM

## 2019-01-22 DIAGNOSIS — R05 Cough: Secondary | ICD-10-CM | POA: Diagnosis not present

## 2019-01-22 DIAGNOSIS — R058 Other specified cough: Secondary | ICD-10-CM

## 2019-01-22 MED ORDER — BUDESONIDE-FORMOTEROL FUMARATE 80-4.5 MCG/ACT IN AERO: INHALATION_SPRAY | RESPIRATORY_TRACT | 11 refills | Status: DC

## 2019-01-22 MED FILL — SYMBICORT 80-4.5 MCG INH: 80-4.5 | 60 days supply | Qty: 10 | Fill #0

## 2019-01-22 NOTE — Progress Notes (Signed)
[name], female    DOB: Feb 05, 1932,     MRN: [id number]   Brief patient profile:  71 yowf never smoker  With onset doe house to mailbox x 2014 and no better p  AVR 2014 (though records suggest her breathing did improve) or spiriva, symbicort or saba assoc with noct cough so referred to pulmonary clinic 08/22/2018 by Sherrie Mustache   Was being followed by [facility] / Terie Purser Joya Gaskins since 1992 for "laryngel spasms" with botulinum injects     History of Present Illness  08/22/2018  Pulmonary/ 1st office eval/Wert  Chief Complaint  Patient presents with  . Pulmonary Consult    Referred by Sherrie Mustache, NP. Pt c/o SOB since had heart surgery in 2014. She has occ cough when she lies down.   Dyspnea: slt uphill 50 ft and has to stop Cough: assoc with ex / candy helps but using peppermint  Sleep:p lie down x 5-10 min most nights starts coughing, helps to sit up  SABA use: once a day at most  Seeing Dr Joya Gaskins every 3-4 months with shots as above helps her voice and ? Her sob  for a month or two.  On  prilosec 20 mg daily 15 min ac  rec Prilosec (omeprazole)  20 mg x 2 x 30 min  Take 30- 60 min before your first and last meals of the day and take pepcid ac (otc)  20 mg after supper. If you are still having troubles with the nighttime cough > For drainage / throat tickle try take CHLORPHENIRAMINE  4 mg  (Chlortab 4mg   at [employer]  Stop spiriva / symbiocort and loratidine(clariton)  GERD diet   Please schedule a follow up office visit in 4 weeks, sooner if needed  with all medications /inhalers/ solutions in hand so we can verify exactly what you are taking. This includes all medications from all doctors and over the counters    09/26/2018  f/u ov/Wert re: vcd/living at home - did not bring all meds  Chief Complaint  Patient presents with  . Follow-up    Breathing has improved some. Her cough is unchanged.   Dyspnea:  slt uphill to mb s stopping using symbicort when gets back  as oob at that point  Cough: better with candy  Sleeping: fine most nights  SABA use: not using  02: none  rec For cough /congestion> mucinex dm 1200 mg every 12 hours as needed  Ok to use symbicort but use it an hour before you go to [employer]   Work on inhaler technique:   01/22/2019  f/u ov/Wert re: vcd/ did not bring meds  -  Using symbicort 80 prn not sure it helps   Chief Complaint  Patient presents with  . Follow-up    Breathing is "ok". She has had PFT's.   Dyspnea:  mb and back several times a week ? If symbicort helped  Cough: better  Sleeping: fine with bed blocks better than before  SABA use: none  02: none    No obvious day to day or daytime variability or assoc excess/ purulent sputum or mucus plugs or hemoptysis or cp or chest tightness, subjective wheeze or overt sinus or hb symptoms.   Sleeping  without nocturnal  or early am exacerbation  of respiratory  c/o's or need for noct saba. Also denies any obvious fluctuation of symptoms with weather or environmental changes or other aggravating or alleviating factors except as outlined above  No unusual exposure hx or h/o childhood pna/ asthma or knowledge of premature birth.  Current Allergies, Complete Past Medical History, Past Surgical History, Family History, and Social History were reviewed in [employer] record.  ROS  The following are not active complaints unless bolded Hoarseness, sore throat, dysphagia, dental problems, itching, sneezing,  nasal congestion or discharge of excess mucus or purulent secretions, ear ache,   fever, chills, sweats, unintended wt loss or wt gain, classically pleuritic or exertional cp,  orthopnea pnd or arm/hand swelling  or leg swelling, presyncope, palpitations, abdominal pain, anorexia, nausea, vomiting, diarrhea  or change in bowel habits or change in bladder habits, change in stools or change in urine, dysuria, hematuria,  rash, arthralgias, visual complaints,  headache, numbness, weakness or ataxia or problems with walking or coordination,  change in mood or  memory.        Current Meds  Medication Sig  . acetaminophen (TYLENOL) 325 MG tablet Take 650 mg by mouth at bedtime.  Marland Kitchen acetaminophen (TYLENOL) 650 MG CR tablet Take 650 mg by mouth. 1 a day every morning  . albuterol (PROAIR HFA) 108 (90 Base) MCG/ACT inhaler INHALE 1 PUFF INTO THE LUNGS EVERY 6 (SIX) HOURS AS NEEDED FOR WHEEZING OR SHORTNESS OF BREATH.  Marland Kitchen apixaban (ELIQUIS) 5 MG TABS tablet Take 1 tablet (5 mg total) by mouth 2 (two) times daily.  . Ascorbic Acid (VITAMIN C) 100 MG tablet Take 100 mg by mouth daily.  . Biotin 5000 MCG CAPS Take 1 capsule by mouth daily.  . budesonide-formoterol (SYMBICORT) 160-4.5 MCG/ACT inhaler Inhale 2 puffs into the lungs 2 (two) times daily.  . Calcium Citrate-Vitamin D (CITRACAL + D PO) Take 1 tablet by mouth 2 (two) times daily.   . Chlorpheniramine Maleate (EQ CHLORTABS PO) Take 1 tablet by mouth every 4 (four) hours as needed.  Marland Kitchen dextromethorphan-guaiFENesin (MUCINEX DM) 30-600 MG 12hr tablet Take 1 tablet by mouth 2 (two) times daily as needed.   . famotidine (PEPCID) 20 MG tablet One after supper  . fluticasone (FLONASE) 50 MCG/ACT nasal spray Place 1 spray into both nostrils 2 (two) times daily.  . hydrOXYzine (ATARAX/VISTARIL) 25 MG tablet Take one tablet by mouth daily as needed for itching.  . losartan (COZAAR) 25 MG tablet Take 2 tablets (50 mg total) by mouth daily.  . Magnesium Hydroxide (PHILLIPS MILK OF MAGNESIA PO) Take 15 mL by mouth. Take daily at 6 o'clock at night  . metoprolol tartrate (LOPRESSOR) 25 MG tablet Take 1 tablet (25 mg total) by mouth 2 (two) times daily.  . Omega-3 Fatty Acids (FISH OIL PO) Take 1 capsule by mouth daily.  Marland Kitchen omeprazole (PRILOSEC) 20 MG capsule Take 20 mg by mouth daily.  . shark liver oil-cocoa butter (PREPARATION H) 0.25-3-85.5 % suppository Place 1 suppository rectally as needed.  . vitamin E 400 UNIT  capsule Take 400 Units by mouth daily.                         Objective:    amb hoarse  wf nad    01/22/2019     149  09/26/18 148 lb (67.1 kg)  08/31/18 148 lb (67.1 kg)  08/22/18 153 lb 4.8 oz (69.5 kg)    Vital signs reviewed - Note on arrival 02 sats  100% on RA     HEENT : pt wearing mask not removed for exam due to covid -19 concerns.  NECK :  without JVD/Nodes/TM/ nl carotid upstrokes bilaterally   LUNGS: no acc muscle use,  Nl contour chest which is clear to A and P bilaterally without cough on insp or exp maneuvers   CV:  RRR  no s3  II/ VI sem s  increase in P2, and no edema   ABD:  soft and nontender with nl inspiratory excursion in the supine position. No bruits or organomegaly appreciated, bowel sounds nl  MS:  Nl gait/ ext warm without deformities, calf tenderness, cyanosis or clubbing No obvious joint restrictions   SKIN: warm and dry without lesions    NEURO:  alert, approp, nl sensorium with  no motor or cerebellar deficits apparent.               Assessment

## 2019-01-22 NOTE — Patient Instructions (Addendum)
Change symbicort to 80 (dulera 100)   2 pffs each am  Before daily dental care  Please schedule a follow up visit in 6  months but call sooner if needed

## 2019-01-23 ENCOUNTER — Encounter: Payer: Self-pay | Admitting: Internal Medicine

## 2019-01-23 NOTE — Assessment & Plan Note (Signed)
Onset ? 2014  - max rx for gerd / 1st gen H1 blockers per guidelines  08/22/2018 > some better  - Allergy profile 01/16/2019 >  Eos 0. /  IgE  60 RAST neg  - PFT's  01/22/2019  FEV1 1.18 (68 % ) ratio 0.71  p 13 % improvement from saba p 0 prior to study with DLCO  17.22 (94%) corrects to 4.79 (118%)  for alv volume and FV curve min curvature, no insp plateau  - 01/22/2019  After extensive coaching inhaler device,  effectiveness =   75% with symbicort rec 80 x 2 pffs daily followed by her routine dental/oral care    So she does have a small element of asthma and may benefit from more routine use of laba/ics in low doses so as not to aggravate her UACS with option to increase to 2 bid for any flares.  Encouraged also to keep up her regular walking to maintain conditioning  I had an extended discussion with the patient and daugher Kimi  reviewing all relevant studies completed to date and  lasting 15 to 20 minutes of a 25 minute visit    I performed detailed device teaching using a teach back method which extended face to face time for this visit (see above)  Each maintenance medication was reviewed in detail including emphasizing most importantly the difference between maintenance and prns and under what circumstances the prns are to be triggered using an action plan format that is not reflected in the computer generated alphabetically organized AVS which I have not found useful in most complex patients, especially with respiratory illnesses  Please see AVS for specific instructions unique to this visit that I personally wrote and verbalized to the the pt in detail and then reviewed with pt  by my nurse highlighting any  changes in therapy recommended at today's visit to their plan of care.

## 2019-01-23 NOTE — Assessment & Plan Note (Signed)
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 /  IgE  60 RAST neg   F/u with Dr Joya Gaskins planned for Dec 2020

## 2019-02-02 DIAGNOSIS — J385 Laryngeal spasm: Secondary | ICD-10-CM | POA: Diagnosis not present

## 2019-02-12 ENCOUNTER — Other Ambulatory Visit: Payer: Self-pay

## 2019-02-12 ENCOUNTER — Encounter: Payer: Self-pay | Admitting: Family

## 2019-02-12 ENCOUNTER — Ambulatory Visit (INDEPENDENT_AMBULATORY_CARE_PROVIDER_SITE_OTHER): Payer: Medicare Other | Admitting: Family

## 2019-02-12 VITALS — BP 144/80 | HR 68 | Temp 97.7°F | Ht 63.0 in | Wt 146.2 lb

## 2019-02-12 DIAGNOSIS — G44309 Post-traumatic headache, unspecified, not intractable: Secondary | ICD-10-CM

## 2019-02-12 DIAGNOSIS — W19XXXA Unspecified fall, initial encounter: Secondary | ICD-10-CM

## 2019-02-12 DIAGNOSIS — H538 Other visual disturbances: Secondary | ICD-10-CM

## 2019-02-12 DIAGNOSIS — Y92009 Unspecified place in unspecified non-institutional (private) residence as the place of occurrence of the external cause: Secondary | ICD-10-CM

## 2019-02-12 DIAGNOSIS — H9209 Otalgia, unspecified ear: Secondary | ICD-10-CM

## 2019-02-12 DIAGNOSIS — R110 Nausea: Secondary | ICD-10-CM

## 2019-02-12 NOTE — Patient Instructions (Signed)
Please go to ED if symptoms worsen.

## 2019-02-12 NOTE — Progress Notes (Signed)
Provider: Paitynn Mikus FNP-C  Lauree Chandler, NP  Patient Care Team: Lauree Chandler, NP as PCP - General (Geriatric Medicine) Druscilla Brownie, MD as Consulting Physician (Dermatology) [name] Irani, MD (Inactive) as Consulting Physician (Gastroenterology) Neldon Mc, MD as Consulting Physician (General Surgery) Lorretta Harp, MD as Consulting Physician (Cardiology) Bjorn Loser, MD as Consulting Physician (Urology) Lavell Anchors, MD as Consulting Physician (Ophthalmology)  Extended Emergency Contact Information Primary Emergency Contact: [place] of [place] Phone: [phone] Mobile Phone: [phone] Relation: Daughter Secondary Emergency Contact: Raffie, [place] Mobile Phone: [phone] Relation: Son  Code Status:  Full Code  Goals of care: Advanced Directive information Advanced Directives 08/31/2018  Does Patient Have a Medical Advance Directive? No  Would patient like information on creating a medical advance directive? No - Patient declined  Pre-existing out of facility DNR order (yellow form or pink MOST form) -     Chief Complaint  Patient presents with  . Acute Visit    [place] 01/12/2019 and hit head, been having headaches since fall, patient states she believes she needs MRI. Sometimes she feels nauseous and causes ear to hurt.   . Medication Management    Patient would like samples of Eliquis 5 and metoprolol 25 if possible      HPI:  Pt is a 85 y.o. female seen today for an acute visit for evaluation of [place] 01/12/2019 and hit head.she states has been having headaches since the  Fall.also has blurry vision.Sometimes she feels nauseous.Also states ears hurt since she fell.she denies loss of consciousness,dizziness,weakness or numbness of extremities.she states did not go to the ED when she fell due fear for COVID-19.she would like an MRI of the brain to be ordered but does not want to go to the ED.  she would like  samples of Eliquis 5 and metoprolol 25 if possible.No samples available.Patient verbalized understanding.    Past Medical History:  Diagnosis Date  . Aortic stenosis 07/20/2012   Aortic stenosis   . Aortic valve disorder 08/13/2011   ECHO - EF 123456; mild diastolic dysfunction; calcified aortic valve, not well visualized; mod/severe aortic stenosis w/ worsening gradients when compared to 2012  . Arthritis   . Cancer ([place])    Breast  . Carotid bruit 08/06/2008   Doppler - R ECA demonstrates noarrowing w/ elevated velocities consistent w/ >70% diameter reduction; R and L ICAs show no evidence of diameter reduction, significant tortuosity or vascular abnormality;   . Change in voice   . COPD (chronic obstructive pulmonary disease) ([place])   . Hearing loss   . Heart murmur   . Hyperlipidemia   . Hypertension    dr Gwenlyn Found  . Osteoporosis   . PAF (paroxysmal atrial fibrillation) ([place]) 09/04/2012  . PVD (peripheral vascular disease) ([place]) 08/13/2010   R/P MV - normal pattern of perfusion in all regions, EF 76%; no significant wall abnormalities noted; normal perfusion study  . Right carotid bruit    high-grade right external carotid artery stenosis by 2 parts ultrasound 2 years ago  . S/P aortic valve replacement with bioprosthetic valve 08/30/2012   21 mm [facility] Ease bovine pericardial tissue valve   Past Surgical History:  Procedure Laterality Date  . ABDOMINAL HYSTERECTOMY     Partial  . AORTIC VALVE REPLACEMENT N/A 08/30/2012   Procedure: AORTIC VALVE REPLACEMENT (AVR);  Surgeon: Rexene Alberts, MD;  Location: [facility];  Service: Open Heart Surgery;  Laterality: N/A;  . BIOPSY SHOULDER Left 10/08/2005  shave biopsy  . BREAST LUMPECTOMY  02/25/1997   right  . CARDIAC CATHETERIZATION    . [place]  . COSMETIC SURGERY Left 1997   Breast implant  . EYE SURGERY  1997   Cataract surgery  . INTRAOPERATIVE TRANSESOPHAGEAL ECHOCARDIOGRAM N/A 08/30/2012   Procedure: INTRAOPERATIVE  TRANSESOPHAGEAL ECHOCARDIOGRAM;  Surgeon: Rexene Alberts, MD;  Location: [facility];  Service: Open Heart Surgery;  Laterality: N/A;  . LEFT AND RIGHT HEART CATHETERIZATION WITH CORONARY ANGIOGRAM N/A 08/14/2012   Procedure: LEFT AND RIGHT HEART CATHETERIZATION WITH CORONARY ANGIOGRAM;  Surgeon: Lorretta Harp, MD;  Location: [facility] CATH LAB;  Service: Cardiovascular;  Laterality: N/A;  . LEFT HEART CATH  08/14/12   Nl cors, AS  . MASTECTOMY  09/29/95   left  . PARTIAL HYSTERECTOMY    . TOTAL HIP ARTHROPLASTY  09/2010    Allergies  Allergen Reactions  . Lisinopril Cough  . Codeine Rash    All over the body.    Outpatient Encounter Medications as of 02/12/2019  Medication Sig  . acetaminophen (TYLENOL) 325 MG tablet Take 650 mg by mouth at bedtime.  Marland Kitchen acetaminophen (TYLENOL) 650 MG CR tablet Take 650 mg by mouth. 1 a day every morning  . albuterol (PROAIR HFA) 108 (90 Base) MCG/ACT inhaler INHALE 1 PUFF INTO THE LUNGS EVERY 6 (SIX) HOURS AS NEEDED FOR WHEEZING OR SHORTNESS OF BREATH.  Marland Kitchen amoxicillin (AMOXIL) 500 MG tablet Take 4 by mouth 1 hour prior to dental procedure  . apixaban (ELIQUIS) 5 MG TABS tablet Take 1 tablet (5 mg total) by mouth 2 (two) times daily.  . Ascorbic Acid (VITAMIN C) 100 MG tablet Take 100 mg by mouth daily.  . Biotin 5000 MCG CAPS Take 1 capsule by mouth daily.  . budesonide-formoterol (SYMBICORT) 80-4.5 MCG/ACT inhaler 2 puffs each am  . Calcium Citrate-Vitamin D (CITRACAL + D PO) Take 1 tablet by mouth 2 (two) times daily.   . Chlorpheniramine Maleate (EQ CHLORTABS PO) Take 1 tablet by mouth every 4 (four) hours as needed.  Marland Kitchen dextromethorphan-guaiFENesin (MUCINEX DM) 30-600 MG 12hr tablet Take 1 tablet by mouth 2 (two) times daily as needed.   . famotidine (PEPCID) 20 MG tablet One after supper  . fluticasone (FLONASE) 50 MCG/ACT nasal spray Place 1 spray into both nostrils 2 (two) times daily.  . hydrOXYzine (ATARAX/VISTARIL) 25 MG tablet Take one tablet by mouth  daily as needed for itching.  . losartan (COZAAR) 25 MG tablet Take 2 tablets (50 mg total) by mouth daily.  . Magnesium Hydroxide (PHILLIPS MILK OF MAGNESIA PO) Take 15 mL by mouth. Take daily at 6 o'clock at night  . metoprolol tartrate (LOPRESSOR) 25 MG tablet Take 1 tablet (25 mg total) by mouth 2 (two) times daily.  . Omega-3 Fatty Acids (FISH OIL PO) Take 1 capsule by mouth daily.  Marland Kitchen omeprazole (PRILOSEC) 20 MG capsule Take 20 mg by mouth daily.  . shark liver oil-cocoa butter (PREPARATION H) 0.25-3-85.5 % suppository Place 1 suppository rectally as needed.  . vitamin E 400 UNIT capsule Take 400 Units by mouth daily.   No facility-administered encounter medications on file as of 02/12/2019.    Review of Systems  Constitutional: Negative for appetite change, chills, fatigue and fever.  HENT: Positive for hearing loss. Negative for congestion, ear discharge, rhinorrhea, sinus pressure, sinus pain, sneezing, sore throat and trouble swallowing.   Eyes: Negative for pain, discharge, redness and itching.  Blurry vision   Respiratory: Negative for cough, chest tightness, shortness of breath and wheezing.   Cardiovascular: Negative for chest pain, palpitations and leg swelling.  Gastrointestinal: Negative for abdominal distention, abdominal pain, constipation, diarrhea, nausea and vomiting.  Endocrine: Negative for polydipsia, polyphagia and polyuria.  Genitourinary: Negative for difficulty urinating, dysuria, flank pain, frequency and urgency.  Musculoskeletal: Positive for gait problem. Negative for myalgias.  Skin: Negative for color change, pallor and rash.  Neurological: Positive for headaches. Negative for dizziness, syncope, speech difficulty, weakness, light-headedness and numbness.  Hematological: Does not bruise/bleed easily.  Psychiatric/Behavioral: Negative for agitation, confusion and sleep disturbance. The patient is not nervous/anxious.     Immunization History    Administered Date(s) Administered  . Fluad Quad(high Dose 65+) 12/20/2018  . Influenza,inj,Quad PF,6+ Mos 11/29/2012, 11/27/2013, 11/27/2014, 12/09/2015, 01/11/2017, 12/20/2017  . Pneumococcal Conjugate-13 05/21/2014  . Pneumococcal Polysaccharide-23 01/11/2005, 08/15/2012  . Tdap 05/28/2014  . Zoster 01/14/2009  . Zoster Recombinat (Shingrix) 06/24/2017, 11/02/2017   Pertinent  Health Maintenance Due  Topic Date Due  . INFLUENZA VACCINE  Completed  . DEXA SCAN  Completed  . PNA vac Low Risk Adult  Completed   Fall Risk  02/12/2019 12/20/2018 08/31/2018 07/18/2018 05/31/2018  Falls in the past year? 1 0 0 0 1  Number falls in past yr: 0 - 0 0 0  Injury with Fall? 1 - 0 0 0    Vitals:   02/12/19 1509  BP: (!) 144/80  Pulse: 68  Temp: 97.7 F (36.5 C)  TempSrc: Temporal  SpO2: 98%  Weight: 146 lb 3.2 oz (66.3 kg)  Height: 5\' 3"  (1.6 m)   Body mass index is 25.9 kg/m. Physical Exam Vitals reviewed.  Constitutional:      General: She is not in acute distress.    Appearance: She is overweight. She is not ill-appearing.  HENT:     Head: Normocephalic.     Right Ear: Tympanic membrane, ear canal and external ear normal. There is no impacted cerumen.     Left Ear: Tympanic membrane, ear canal and external ear normal. There is no impacted cerumen.     Ears:     Comments: Wears Bilateral hearing aids     Nose: Nose normal. No congestion or rhinorrhea.     Mouth/Throat:     Mouth: Mucous membranes are moist.     Pharynx: Oropharynx is clear. No oropharyngeal exudate or posterior oropharyngeal erythema.  Eyes:     General: No scleral icterus.       Right eye: No discharge.        Left eye: No discharge.     Extraocular Movements: Extraocular movements intact.     Conjunctiva/sclera: Conjunctivae normal.     Pupils: Pupils are equal, round, and reactive to light.  Neck:     Vascular: No carotid bruit.  Cardiovascular:     Rate and Rhythm: Normal rate and regular rhythm.      Pulses: Normal pulses.     Heart sounds: Murmur present. No friction rub. No gallop.   Pulmonary:     Effort: Pulmonary effort is normal. No respiratory distress.     Breath sounds: Normal breath sounds. No wheezing, rhonchi or rales.  Chest:     Chest wall: No tenderness.  Abdominal:     General: Bowel sounds are normal. There is no distension.     Palpations: Abdomen is soft. There is no mass.     Tenderness: There is no abdominal tenderness.  There is no right CVA tenderness, left CVA tenderness, guarding or rebound.  Musculoskeletal:        General: No swelling or tenderness.     Cervical back: Normal range of motion. No rigidity or tenderness.     Right lower leg: No edema.     Left lower leg: No edema.     Comments: Unsteady gait walks with a cane   Lymphadenopathy:     Cervical: No cervical adenopathy.  Skin:    General: Skin is warm and dry.     Coloration: Skin is not pale.     Findings: No bruising, erythema or rash.  Neurological:     Mental Status: She is alert. Mental status is at baseline.     Cranial Nerves: No cranial nerve deficit.     Sensory: No sensory deficit.     Motor: No weakness.     Coordination: Coordination normal.     Gait: Gait abnormal.  Psychiatric:        Mood and Affect: Mood normal.        Behavior: Behavior normal.        Thought Content: Thought content normal.        Judgment: Judgment normal.     Labs reviewed: Recent Labs    05/03/18 1618 08/22/18 1630 12/20/18 1605  NA 141 138 137  K 4.8 3.9 4.6  CL 103 101 102  CO2 31 29 28   GLUCOSE 91 87 94  BUN 25 16 16   CREATININE 1.22* 0.97 1.23*  CALCIUM 10.2 9.9 10.2   Recent Labs    04/12/18 0803 12/20/18 1605  AST 36* 21  ALT 36* 14  BILITOT 0.6 0.6  PROT 5.6* 5.8*   Recent Labs    08/22/18 1630 12/20/18 1605  WBC 7.2 7.2  NEUTROABS 4.4 4,558  HGB 13.0 12.3  HCT 38.5 36.3  MCV 97.9 97.1  PLT 165.0 159   Lab Results  Component Value Date   TSH 1.13 08/22/2018    Lab Results  Component Value Date   HGBA1C 5.5 08/28/2012   Lab Results  Component Value Date   CHOL 117 04/12/2018   HDL 42 (L) 04/12/2018   LDLCALC 58 04/12/2018   TRIG 89 04/12/2018   CHOLHDL 2.8 04/12/2018    Significant Diagnostic Results in last 30 days:  No results found.  Assessment/Plan 1. Post-traumatic headache, not intractable, unspecified chronicity pattern Status post fall 01/12/2019 reports hit back of the head since then has had constant headaches associated with blurry vision,pain in the ears and nausea.she declined to go to the ED.also declined CT scan.Request MRI to be ordered.per [facility] referral coordinator the soonest appointment for MRI will be in January.patient prefers to wait until January ,2021 for the appointment rather than go to the ED or have a CT scan.Will also consider referral to Neurologist if she agrees.  - MR Brain W Wo Contrast; Future  2. Blurry vision, bilateral Onset since fall. - MR Brain W Wo Contrast; Future  3. Nausea On set since fall.No worsening symptoms.eating well without any vomiting. - MR Brain W Wo Contrast; Future  4. Fall at home, initial encounter Recalls falling backwards hitting back of the head on the floor.No bruise on the head since it's been a month since she fell.No tenderness to palpation.   5. Ear ache Reports aches due since fall.No changes in her hearing.TM clear  - MR Brain W Wo Contrast; Future   Family/ staff Communication: Reviewed plan of  care with patient.  Labs/tests ordered: - MR Brain W Wo Contrast; Future  Haydn Hutsell C Meliana Canner, NP

## 2019-02-13 ENCOUNTER — Other Ambulatory Visit: Payer: Self-pay | Admitting: Cardiovascular Disease

## 2019-02-13 ENCOUNTER — Other Ambulatory Visit: Payer: Self-pay | Admitting: Nurse Practitioner

## 2019-02-13 NOTE — Telephone Encounter (Signed)
High risk or very high risk warning populated when attempting to refill medication. RX request sent to PCP for review and approval if warranted.   

## 2019-02-19 MED FILL — ELIQUIS 5 MG TABLET: 5 | 30 days supply | Qty: 60 | Fill #1

## 2019-03-13 ENCOUNTER — Other Ambulatory Visit: Payer: Self-pay

## 2019-03-13 ENCOUNTER — Ambulatory Visit
Admission: RE | Admit: 2019-03-13 | Discharge: 2019-03-13 | Disposition: A | Discharge status: Home / Self Care | Payer: Medicare Other | Source: Ambulatory Visit | Attending: Family | Admitting: Family

## 2019-03-13 DIAGNOSIS — G44309 Post-traumatic headache, unspecified, not intractable: Secondary | ICD-10-CM | POA: Diagnosis not present

## 2019-03-13 DIAGNOSIS — R110 Nausea: Secondary | ICD-10-CM

## 2019-03-13 DIAGNOSIS — H538 Other visual disturbances: Secondary | ICD-10-CM

## 2019-03-13 DIAGNOSIS — H9209 Otalgia, unspecified ear: Secondary | ICD-10-CM

## 2019-03-13 MED ORDER — GADOBENATE DIMEGLUMINE 529 MG/ML IV SOLN
6.0000 mL | Freq: Once | INTRAVENOUS | Status: AC | PRN
  Administered 2019-03-13: 6 mL via INTRAVENOUS

## 2019-03-19 MED FILL — ELIQUIS 5 MG TABLET: 5 | 30 days supply | Qty: 60 | Fill #2

## 2019-03-20 ENCOUNTER — Telehealth: Payer: Self-pay | Admitting: *Deleted

## 2019-03-20 DIAGNOSIS — H538 Other visual disturbances: Secondary | ICD-10-CM

## 2019-03-20 DIAGNOSIS — G44309 Post-traumatic headache, unspecified, not intractable: Secondary | ICD-10-CM

## 2019-03-20 NOTE — Telephone Encounter (Signed)
Patient calling asking for the results of the MRI that was done on 03/13/2019. Please advise

## 2019-03-20 NOTE — Telephone Encounter (Signed)
It appears [name] ordered the MRI.   No evidence of acute intracranial abnormality.   Moderate chronic small vessel ischemic disease has progressed since prior MRI 04/17/2009.   Mild generalized cerebral atrophy.   There is no acute bleed or mass noted. She does have small vessel ischemic disease (small stroke) with some shrinkage of the brain.   If she is still having symptoms would recommend neurology referral.

## 2019-03-20 NOTE — Telephone Encounter (Signed)
Spoke with patient and advised results letter mailed to patients home address with results.  

## 2019-03-22 NOTE — Telephone Encounter (Signed)
Patient daughter [name] called back and stated that patient is requesting the referral to the Neurologist.  Please place the referral.  Daughter is requesting an afternoon appointment. Please call daughter [name] [phone] with any information/appointment.

## 2019-03-22 NOTE — Addendum Note (Signed)
Addended by: Lauree Chandler on: 03/22/2019 03:10 PM   Modules accepted: Orders

## 2019-04-16 MED FILL — ELIQUIS 5 MG TABLET: 5 | 30 days supply | Qty: 60 | Fill #3

## 2019-04-23 MED FILL — FLUTICASONE PROP 50 MCG SPR: 50 | 30 days supply | Qty: 16 | Fill #0

## 2019-04-23 MED FILL — SYMBICORT 80-4.5 MCG INH: 80-4.5 | 60 days supply | Qty: 10 | Fill #1

## 2019-04-23 MED FILL — METOPROLOL TARTRATE 25 MG T: 25 | 90 days supply | Qty: 180 | Fill #0

## 2019-04-23 MED FILL — LOSARTAN POTASSIUM 25 MG TA: 25 | 30 days supply | Qty: 60 | Fill #0

## 2019-05-03 ENCOUNTER — Telehealth: Payer: Self-pay | Admitting: *Deleted

## 2019-05-03 ENCOUNTER — Ambulatory Visit (INDEPENDENT_AMBULATORY_CARE_PROVIDER_SITE_OTHER): Payer: Medicare Other | Admitting: Neurology

## 2019-05-03 ENCOUNTER — Other Ambulatory Visit: Payer: Self-pay

## 2019-05-03 ENCOUNTER — Encounter: Payer: Self-pay | Admitting: Neurology

## 2019-05-03 VITALS — BP 203/98 | HR 102 | Resp 20 | Ht 63.0 in | Wt 145.0 lb

## 2019-05-03 DIAGNOSIS — G44309 Post-traumatic headache, unspecified, not intractable: Secondary | ICD-10-CM

## 2019-05-03 NOTE — Patient Instructions (Signed)
Your MRI of the brain looked okay.  Your neurologic exam looks okay.  I don't see anything concerning

## 2019-05-03 NOTE — Progress Notes (Signed)
NEUROLOGY CONSULTATION NOTE  [name] MRN: [id number] DOB: 1931-10-29  Referring provider: Sherrie Mustache, NP Primary care provider: Sherrie Mustache, NP  Reason for consult:  headache  HISTORY OF PRESENT ILLNESS: [name] is an 86 year old female with laryngeal spasm and paroxysmal atrial fibrillation on Eliquis who presents for post-traumatic headache.  History supplemented by geriatric notes.  She fell on 01/12/2019, striking the back of her head.  She did not immediately seek attention in the ED due to fear of Covid.  She followed up with her PCP about a month later.  At that time, she continued to experience blurred vision, headache and some nausea.  MRI of brain with and without contrast from 03/13/2019 personally reviewed and showed chronic small vessel ischemic changes but no acute abnormality.  Since then, headaches have improved.  She may have a headache every night when she lays down but it only lasts 10 minutes and she is able to fall asleep.  No neck pain.  She no longer has blurred vision but at times thinks she sees movement in her periphery but when she turns to look, nobody is there.     PAST MEDICAL HISTORY: Past Medical History:  Diagnosis Date  . Aortic stenosis 07/20/2012   Aortic stenosis   . Aortic valve disorder 08/13/2011   ECHO - EF 123456; mild diastolic dysfunction; calcified aortic valve, not well visualized; mod/severe aortic stenosis w/ worsening gradients when compared to 2012  . Arthritis   . Cancer ([place])    Breast  . Carotid bruit 08/06/2008   Doppler - R ECA demonstrates noarrowing w/ elevated velocities consistent w/ >70% diameter reduction; R and L ICAs show no evidence of diameter reduction, significant tortuosity or vascular abnormality;   . Change in voice   . COPD (chronic obstructive pulmonary disease) ([place])   . Hearing loss   . Heart murmur   . Hyperlipidemia   . Hypertension    dr Gwenlyn Found  . Osteoporosis   . PAF (paroxysmal  atrial fibrillation) ([place]) 09/04/2012  . PVD (peripheral vascular disease) ([place]) 08/13/2010   R/P MV - normal pattern of perfusion in all regions, EF 76%; no significant wall abnormalities noted; normal perfusion study  . Right carotid bruit    high-grade right external carotid artery stenosis by 2 parts ultrasound 2 years ago  . S/P aortic valve replacement with bioprosthetic valve 08/30/2012   21 mm [facility] Ease bovine pericardial tissue valve    PAST SURGICAL HISTORY: Past Surgical History:  Procedure Laterality Date  . ABDOMINAL HYSTERECTOMY     Partial  . AORTIC VALVE REPLACEMENT N/A 08/30/2012   Procedure: AORTIC VALVE REPLACEMENT (AVR);  Surgeon: Rexene Alberts, MD;  Location: [place];  Service: Open Heart Surgery;  Laterality: N/A;  . BIOPSY SHOULDER Left 10/08/2005   shave biopsy  . BREAST LUMPECTOMY  02/25/1997   right  . CARDIAC CATHETERIZATION    . [place]  . COSMETIC SURGERY Left 1997   Breast implant  . EYE SURGERY  1997   Cataract surgery  . INTRAOPERATIVE TRANSESOPHAGEAL ECHOCARDIOGRAM N/A 08/30/2012   Procedure: INTRAOPERATIVE TRANSESOPHAGEAL ECHOCARDIOGRAM;  Surgeon: Rexene Alberts, MD;  Location: [place];  Service: Open Heart Surgery;  Laterality: N/A;  . LEFT AND RIGHT HEART CATHETERIZATION WITH CORONARY ANGIOGRAM N/A 08/14/2012   Procedure: LEFT AND RIGHT HEART CATHETERIZATION WITH CORONARY ANGIOGRAM;  Surgeon: Lorretta Harp, MD;  Location[facility]ATH LAB;  Service: Cardiovascular;  Laterality: N/A;  .  LEFT HEART CATH  08/14/12   Nl cors, AS  . MASTECTOMY  09/29/95   left  . PARTIAL HYSTERECTOMY    . TOTAL HIP ARTHROPLASTY  09/2010    MEDICATIONS: Current Outpatient Medications on File Prior to Visit  Medication Sig Dispense Refill  . acetaminophen (TYLENOL) 325 MG tablet Take 650 mg by mouth at bedtime.    Marland Kitchen acetaminophen (TYLENOL) 650 MG CR tablet Take 650 mg by mouth. 1 a day every morning    . albuterol (PROAIR HFA) 108 (90 Base) MCG/ACT inhaler  INHALE 1 PUFF INTO THE LUNGS EVERY 6 (SIX) HOURS AS NEEDED FOR WHEEZING OR SHORTNESS OF BREATH. 8.5 Inhaler 2  . amoxicillin (AMOXIL) 500 MG tablet Take 4 by mouth 1 hour prior to dental procedure    . apixaban (ELIQUIS) 5 MG TABS tablet Take 1 tablet (5 mg total) by mouth 2 (two) times daily. 180 tablet 1  . Ascorbic Acid (VITAMIN C) 100 MG tablet Take 100 mg by mouth daily.    . Biotin 5000 MCG CAPS Take 1 capsule by mouth daily.    . budesonide-formoterol (SYMBICORT) 80-4.5 MCG/ACT inhaler 2 puffs each am 1 Inhaler 11  . Calcium Citrate-Vitamin D (CITRACAL + D PO) Take 1 tablet by mouth 2 (two) times daily.     . Chlorpheniramine Maleate (EQ CHLORTABS PO) Take 1 tablet by mouth every 4 (four) hours as needed.    Marland Kitchen dextromethorphan-guaiFENesin (MUCINEX DM) 30-600 MG 12hr tablet Take 1 tablet by mouth 2 (two) times daily as needed.     . famotidine (PEPCID) 20 MG tablet One after supper    . fluticasone (FLONASE) 50 MCG/ACT nasal spray Place 1 spray into both nostrils 2 (two) times daily. 16 g 1  . hydrOXYzine (ATARAX/VISTARIL) 25 MG tablet Take one tablet by mouth daily as needed for itching. 30 tablet 3  . losartan (COZAAR) 25 MG tablet Take 2 tablets (50 mg total) by mouth daily. 180 tablet 1  . Magnesium Hydroxide (PHILLIPS MILK OF MAGNESIA PO) Take 15 mL by mouth. Take daily at 6 o'clock at night    . metoprolol tartrate (LOPRESSOR) 25 MG tablet TAKE 1 TABLET BY MOUTH TWICE A DAY 180 tablet 2  . Omega-3 Fatty Acids (FISH OIL PO) Take 1 capsule by mouth daily.    Marland Kitchen omeprazole (PRILOSEC) 20 MG capsule Take 20 mg by mouth daily.    . shark liver oil-cocoa butter (PREPARATION H) 0.25-3-85.5 % suppository Place 1 suppository rectally as needed.    . vitamin E 400 UNIT capsule Take 400 Units by mouth daily.     No current facility-administered medications on file prior to visit.    ALLERGIES: Allergies  Allergen Reactions  . Lisinopril Cough  . Codeine Rash    All over the body.     FAMILY HISTORY: Family History  Problem Relation Age of Onset  . Cancer Mother        Bladder  . Early death Father        Tractor accident  . Early death Brother        Radiation protection practitioner  . Early death Brother        during heart surgery  .  SOCIAL HISTORY: Social History   Socioeconomic History  . Marital status: Widowed    Spouse name: Not on file  . Number of children: 2  . Years of education: 9  . Highest education level: Not on file  Occupational History  . Occupation: retired  Tobacco Use  . Smoking status: Never Smoker  . Smokeless tobacco: Never Used  Substance and Sexual Activity  . Alcohol use: No  . Drug use: No  . Sexual activity: Never  Other Topics Concern  . Not on file  Social History Narrative   Right handed   One story home   Social Determinants of Health   Financial Resource Strain:   . Difficulty of Paying Living Expenses: Not on file  Food Insecurity:   . Worried About Charity fundraiser in the Last Year: Not on file  . Ran Out of Food in the Last Year: Not on file  Transportation Needs:   . Lack of Transportation (Medical): Not on file  . Lack of Transportation (Non-Medical): Not on file  Physical Activity:   . Days of Exercise per Week: Not on file  . Minutes of Exercise per Session: Not on file  Stress:   . Feeling of Stress : Not on file  Social Connections:   . Frequency of Communication with Friends and Family: Not on file  . Frequency of Social Gatherings with Friends and Family: Not on file  . Attends Religious Services: Not on file  . Active Member of Clubs or Organizations: Not on file  . Attends Archivist Meetings: Not on file  . Marital Status: Not on file  Intimate Partner Violence:   . Fear of Current or Ex-Partner: Not on file  . Emotionally Abused: Not on file  . Physically Abused: Not on file  . Sexually Abused: Not on file    REVIEW OF SYSTEMS: Constitutional: No fevers, chills, or sweats, no  generalized fatigue, change in appetite Eyes: No visual changes, double vision, eye pain Ear, nose and throat: No hearing loss, ear pain, nasal congestion, sore throat Cardiovascular: No chest pain, palpitations Respiratory:  No shortness of breath at rest or with exertion, wheezes GastrointestinaI: No nausea, vomiting, diarrhea, abdominal pain, fecal incontinence Genitourinary:  No dysuria, urinary retention or frequency Musculoskeletal:  No neck pain, back pain Integumentary: No rash, pruritus, skin lesions Neurological: as above Psychiatric: No depression, insomnia, anxiety Endocrine: No palpitations, fatigue, diaphoresis, mood swings, change in appetite, change in weight, increased thirst Hematologic/Lymphatic:  No purpura, petechiae. Allergic/Immunologic: no itchy/runny eyes, nasal congestion, recent allergic reactions, rashes  PHYSICAL EXAM: Blood pressure (!) 203/98, pulse (!) 102, resp. rate 20, height 5\' 3"  (1.6 m), weight 145 lb (65.8 kg), SpO2 95 %. General: No acute distress.  Patient appears well-groomed.  Head:  Normocephalic/atraumatic Eyes:  fundi examined but not visualized Neck: supple, no paraspinal tenderness, full range of motion Back: No paraspinal tenderness Heart: regular rate and rhythm Lungs: Clear to auscultation bilaterally. Vascular: No carotid bruits. Neurological Exam: Mental status: alert and oriented to person, place, and time, recent and remote memory intact, fund of knowledge intact, attention and concentration intact, speech fluent and not dysarthric, language intact. Cranial nerves: CN I: not tested CN II: pupils equal, round and reactive to light, visual fields intact CN III, IV, VI:  full range of motion, no nystagmus, no ptosis CN V: facial sensation intact CN VII: upper and lower face symmetric CN VIII: hearing intact CN IX, X: gag intact, uvula midline CN XI: sternocleidomastoid and trapezius muscles intact CN XII: tongue midline Bulk &  Tone: normal, no fasciculations. Motor:  5/5 throughout  Sensation: temperature and vibration sensation intact (except reduced vibratory sensation in the right first toe). Deep Tendon Reflexes:  trace throughout, toes downgoing. Finger to  nose testing:  Without dysmetria.  Heel to shin:  Without dysmetria.  Gait:  Wide-based gait.  Romberg negative.  IMPRESSION: Post-traumatic headache, not intractable, improved.  Currently manageable, brief.  Not requiring daily preventative medication.  PLAN: Follow up as needed.   Thank you for allowing me to take part in the care of this patient.  Metta Clines, DO  CC: Sherrie Mustache, NP

## 2019-05-03 NOTE — Telephone Encounter (Signed)
-----   Message from Lauree Chandler, NP sent at 05/03/2019  3:17 PM EST ----- Pt with elevated bp and HR reading during visit with neurologist, please have her check blood pressure at home after medication has been giving and follow up in office with bp and Hr readings in 1 week. Thank you

## 2019-05-04 NOTE — Telephone Encounter (Signed)
Patient cannot do Friday's. Scheduled appointment for Monday the 15th. Patient will keep log of readings.

## 2019-05-14 ENCOUNTER — Encounter: Payer: Self-pay | Admitting: Nurse Practitioner

## 2019-05-14 ENCOUNTER — Other Ambulatory Visit: Payer: Self-pay

## 2019-05-14 ENCOUNTER — Ambulatory Visit: Payer: Self-pay | Admitting: Nurse Practitioner

## 2019-05-14 ENCOUNTER — Ambulatory Visit (INDEPENDENT_AMBULATORY_CARE_PROVIDER_SITE_OTHER): Payer: Medicare Other | Admitting: Nurse Practitioner

## 2019-05-14 VITALS — BP 130/70 | HR 66 | Temp 97.1°F | Wt 148.2 lb

## 2019-05-14 DIAGNOSIS — G44309 Post-traumatic headache, unspecified, not intractable: Secondary | ICD-10-CM | POA: Diagnosis not present

## 2019-05-14 DIAGNOSIS — N3281 Overactive bladder: Secondary | ICD-10-CM | POA: Diagnosis not present

## 2019-05-14 DIAGNOSIS — I10 Essential (primary) hypertension: Secondary | ICD-10-CM

## 2019-05-14 MED ORDER — LOSARTAN POTASSIUM 50 MG PO TABS: 50.0000 mg | ORAL_TABLET | Freq: Every day | ORAL | 3 refills | Status: DC

## 2019-05-14 MED ORDER — MIRABEGRON ER 50 MG PO TB24: 50.0000 mg | ORAL_TABLET | Freq: Every day | ORAL | 1 refills | Status: DC

## 2019-05-14 MED FILL — LOSARTAN POTASSIUM 50 MG TA: 50 | 90 days supply | Qty: 90 | Fill #0

## 2019-05-14 MED FILL — MYRBETRIQ ER 50 MG TABLET: 50 | 30 days supply | Qty: 30 | Fill #0

## 2019-05-14 NOTE — Progress Notes (Signed)
Careteam: Patient Care Team: Lauree Chandler, NP as PCP - General (Geriatric Medicine) Druscilla Brownie, MD as Consulting Physician (Dermatology) [name] Irani, MD (Inactive) as Consulting Physician (Gastroenterology) Neldon Mc, MD as Consulting Physician (General Surgery) Lorretta Harp, MD as Consulting Physician (Cardiology) Bjorn Loser, MD as Consulting Physician (Urology) Lavell Anchors, MD as Consulting Physician (Ophthalmology) Pieter Partridge, DO as Consulting Physician (Neurology)  PLACE OF SERVICE:  [place]  Advanced Directive information    Allergies  Allergen Reactions  . Lisinopril Cough  . Codeine Rash    All over the body.    Chief Complaint  Patient presents with  . Follow-up    Blood pressure follow-up. Patient with B/P home readings. Using a wrist cuff. Here with daughter, [name]   . Medication Management    Discuss restarting Myrbetriq, patient went off due to elevated B/P.      HPI: Patient is a 86 y.o. female for follow up blood pressure.   She has been taking her blood pressure after her medication and blood pressure readings  Ranging from 127-143/67-81, most blood pressures over the last 3 days were below 140/80.   Reports she thought myrbetriq was effecting blood pressure but really there has not been much difference since she has been off. (9 MONTHS) and request to restart- needs new Rx  For blood pressure she takes losartan 50 mg daily and metoprolol 25 by mouth twice daily   Headaches have resolved since going to neurologist.  No chest pains or shortness of breath noted.   Review of Systems:  Review of Systems  Constitutional: Negative for chills, fever and weight loss.  Respiratory: Negative for cough, sputum production and shortness of breath.   Cardiovascular: Negative for chest pain, palpitations and leg swelling.  Gastrointestinal: Negative for heartburn.  Genitourinary: Positive for frequency.    Musculoskeletal: Negative for back pain, joint pain and myalgias.  Skin: Negative.   Neurological: Negative for dizziness, weakness and headaches.    Past Medical History:  Diagnosis Date  . Aortic stenosis 07/20/2012   Aortic stenosis   . Aortic valve disorder 08/13/2011   ECHO - EF 123456; mild diastolic dysfunction; calcified aortic valve, not well visualized; mod/severe aortic stenosis w/ worsening gradients when compared to 2012  . Arthritis   . Cancer ([place])    Breast  . Carotid bruit 08/06/2008   Doppler - R ECA demonstrates noarrowing w/ elevated velocities consistent w/ >70% diameter reduction; R and L ICAs show no evidence of diameter reduction, significant tortuosity or vascular abnormality;   . Change in voice   . COPD (chronic obstructive pulmonary disease) ([place])   . Hearing loss   . Heart murmur   . Hyperlipidemia   . Hypertension    dr Gwenlyn Found  . Osteoporosis   . PAF (paroxysmal atrial fibrillation) ([place]) 09/04/2012  . PVD (peripheral vascular disease) ([place]) 08/13/2010   R/P MV - normal pattern of perfusion in all regions, EF 76%; no significant wall abnormalities noted; normal perfusion study  . Right carotid bruit    high-grade right external carotid artery stenosis by 2 parts ultrasound 2 years ago  . S/P aortic valve replacement with bioprosthetic valve 08/30/2012   21 mm [facility] Ease bovine pericardial tissue valve   Past Surgical History:  Procedure Laterality Date  . ABDOMINAL HYSTERECTOMY     Partial  . AORTIC VALVE REPLACEMENT N/A 08/30/2012   Procedure: AORTIC VALVE REPLACEMENT (AVR);  Surgeon: Rexene Alberts, MD;  Location:  [place] OR;  Service: Open Heart Surgery;  Laterality: N/A;  . BIOPSY SHOULDER Left 10/08/2005   shave biopsy  . BREAST LUMPECTOMY  02/25/1997   right  . CARDIAC CATHETERIZATION    . [place]  . COSMETIC SURGERY Left 1997   Breast implant  . EYE SURGERY  1997   Cataract surgery  . INTRAOPERATIVE TRANSESOPHAGEAL ECHOCARDIOGRAM  N/A 08/30/2012   Procedure: INTRAOPERATIVE TRANSESOPHAGEAL ECHOCARDIOGRAM;  Surgeon: Rexene Alberts, MD;  Location: [place];  Service: Open Heart Surgery;  Laterality: N/A;  . LEFT AND RIGHT HEART CATHETERIZATION WITH CORONARY ANGIOGRAM N/A 08/14/2012   Procedure: LEFT AND RIGHT HEART CATHETERIZATION WITH CORONARY ANGIOGRAM;  Surgeon: Lorretta Harp, MD;  [facility] C[facility] LAB;  Service: Cardiovascular;  Laterality: N/A;  . LEFT HEART CATH  08/14/12   Nl cors, AS  . MASTECTOMY  09/29/95   left  . PARTIAL HYSTERECTOMY    . TOTAL HIP ARTHROPLASTY  09/2010   Social History:   reports that she has never smoked. She has never used smokeless tobacco. She reports that she does not drink alcohol or use drugs.  Family History  Problem Relation Age of Onset  . Cancer Mother        Bladder  . Early death Father        Tractor accident  . Early death Brother        Radiation protection practitioner  . Early death Brother        during heart surgery    Medications: Patient's Medications  New Prescriptions   No medications on file  Previous Medications   ACETAMINOPHEN (TYLENOL) 325 MG TABLET    Take 650 mg by mouth at bedtime.   ACETAMINOPHEN (TYLENOL) 650 MG CR TABLET    Take 650 mg by mouth. 1 a day every morning   ALBUTEROL (PROAIR HFA) 108 (90 BASE) MCG/ACT INHALER    INHALE 1 PUFF INTO THE LUNGS EVERY 6 (SIX) HOURS AS NEEDED FOR WHEEZING OR SHORTNESS OF BREATH.   AMOXICILLIN (AMOXIL) 500 MG TABLET    Take 4 by mouth 1 hour prior to dental procedure   APIXABAN (ELIQUIS) 5 MG TABS TABLET    Take 1 tablet (5 mg total) by mouth 2 (two) times daily.   ASCORBIC ACID (VITAMIN C) 100 MG TABLET    Take 100 mg by mouth daily.   BIOTIN 5000 MCG CAPS    Take 1 capsule by mouth daily.   BUDESONIDE-FORMOTEROL (SYMBICORT) 80-4.5 MCG/ACT INHALER    2 puffs each am   CALCIUM CITRATE-VITAMIN D (CITRACAL + D PO)    Take 1 tablet by mouth 2 (two) times daily.    CHLORPHENIRAMINE MALEATE (EQ CHLORTABS PO)    Take 1 tablet by mouth  every 4 (four) hours as needed.   DEXTROMETHORPHAN-GUAIFENESIN (MUCINEX DM) 30-600 MG 12HR TABLET    Take 1 tablet by mouth 2 (two) times daily as needed.    FAMOTIDINE (PEPCID) 20 MG TABLET    One after supper   FLUTICASONE (FLONASE) 50 MCG/ACT NASAL SPRAY    Place 1 spray into both nostrils 2 (two) times daily.   HYDROXYZINE (ATARAX/VISTARIL) 25 MG TABLET    Take one tablet by mouth daily as needed for itching.   LOSARTAN (COZAAR) 25 MG TABLET    Take 2 tablets (50 mg total) by mouth daily.   MAGNESIUM HYDROXIDE (PHILLIPS MILK OF MAGNESIA PO)    Take 15 mL by mouth. Take daily at 6 o'clock at  night   METOPROLOL TARTRATE (LOPRESSOR) 25 MG TABLET    TAKE 1 TABLET BY MOUTH TWICE A DAY   OMEGA-3 FATTY ACIDS (FISH OIL PO)    Take 1 capsule by mouth daily.   OMEPRAZOLE (PRILOSEC) 20 MG CAPSULE    Take 20 mg by mouth daily.   SHARK LIVER OIL-COCOA BUTTER (PREPARATION H) 0.25-3-85.5 % SUPPOSITORY    Place 1 suppository rectally as needed.   VITAMIN E 400 UNIT CAPSULE    Take 400 Units by mouth daily.  Modified Medications   No medications on file  Discontinued Medications   No medications on file    Physical Exam:  Vitals:   05/14/19 1551 05/14/19 1647  BP: (!) 152/84 130/70  Pulse: 66   Temp: (!) 97.1 F (36.2 C)   TempSrc: Temporal   SpO2: 99%   Weight: 148 lb 3.2 oz (67.2 kg)    Body mass index is 26.25 kg/m. Wt Readings from Last 3 Encounters:  05/14/19 148 lb 3.2 oz (67.2 kg)  05/03/19 145 lb (65.8 kg)  02/12/19 146 lb 3.2 oz (66.3 kg)    Physical Exam Constitutional:      General: She is not in acute distress.    Appearance: She is well-developed. She is not diaphoretic.  HENT:     Head: Normocephalic and atraumatic.  Eyes:     Conjunctiva/sclera: Conjunctivae normal.     Pupils: Pupils are equal, round, and reactive to light.  Cardiovascular:     Rate and Rhythm: Normal rate and regular rhythm.     Heart sounds: Normal heart sounds.  Pulmonary:     Effort:  Pulmonary effort is normal.     Breath sounds: Normal breath sounds.  Abdominal:     General: Bowel sounds are normal.  Musculoskeletal:        General: No tenderness.     Cervical back: Normal range of motion and neck supple.  Skin:    General: Skin is warm and dry.  Neurological:     Mental Status: She is alert and oriented to person, place, and time.    Labs reviewed: Basic Metabolic Panel: Recent Labs    08/22/18 1630 12/20/18 1605  NA 138 137  K 3.9 4.6  CL 101 102  CO2 29 28  GLUCOSE 87 94  BUN 16 16  CREATININE 0.97 1.23*  CALCIUM 9.9 10.2  TSH 1.13  --    Liver Function Tests: Recent Labs    12/20/18 1605  AST 21  ALT 14  BILITOT 0.6  PROT 5.8*   No results for input(s): LIPASE, AMYLASE in the last 8760 hours. No results for input(s): AMMONIA in the last 8760 hours. CBC: Recent Labs    08/22/18 1630 12/20/18 1605  WBC 7.2 7.2  NEUTROABS 4.4 4,558  HGB 13.0 12.3  HCT 38.5 36.3  MCV 97.9 97.1  PLT 165.0 159   Lipid Panel: No results for input(s): CHOL, HDL, LDLCALC, TRIG, CHOLHDL, LDLDIRECT in the last 8760 hours. TSH: Recent Labs    08/22/18 1630  TSH 1.13   A1C: Lab Results  Component Value Date   HGBA1C 5.5 08/28/2012     Assessment/Plan 1. Post-traumatic headache, not intractable, unspecified chronicity pattern -reports headaches have resolved at this time.  2. Essential hypertension -overall blood pressure has been stable. She is taking losartan 25 mg tablet twice daily, recommended that she take both tablets at the same time and will change prescription so she gets a 50 mg tablet  to take once daily -to make sure she is sitting prior to taking BP, BP significantly improved on recheck.  -low sodium diet -continues on metoprolol BID - losartan (COZAAR) 50 MG tablet; Take 1 tablet (50 mg total) by mouth daily.  Dispense: 90 tablet; Refill: 3  3. Overactive bladder - mirabegron ER (MYRBETRIQ) 50 MG TB24 tablet; Take 1 tablet (50 mg  total) by mouth daily.  Dispense: 90 tablet; Refill: 1  Next appt: 06/18/2019 as scheduled Tarina Volk K. [place], [facility] [phone]

## 2019-05-14 NOTE — Patient Instructions (Signed)
Make sure you are taking LOSARTAN 50 mg by mouth ONCE DAILY  Your metoprolol 25 mg by mouth TWICE daily   DASH Eating Plan DASH stands for "Dietary Approaches to Stop Hypertension." The DASH eating plan is a healthy eating plan that has been shown to reduce high blood pressure (hypertension). It may also reduce your risk for type 2 diabetes, heart disease, and stroke. The DASH eating plan may also help with weight loss. What are tips for following this plan?  General guidelines  Avoid eating more than 2,300 mg (milligrams) of salt (sodium) a day. If you have hypertension, you may need to reduce your sodium intake to 1,500 mg a day.  Limit alcohol intake to no more than 1 drink a day for nonpregnant women and 2 drinks a day for men. One drink equals 12 oz of beer, 5 oz of wine, or 1 oz of hard liquor.  Work with your health care provider to maintain a healthy body weight or to lose weight. Ask what an ideal weight is for you.  Get at least 30 minutes of exercise that causes your heart to beat faster (aerobic exercise) most days of the week. Activities may include walking, swimming, or biking.  Work with your health care provider or diet and nutrition specialist (dietitian) to adjust your eating plan to your individual calorie needs. Reading food labels   Check food labels for the amount of sodium per serving. Choose foods with less than 5 percent of the Daily Value of sodium. Generally, foods with less than 300 mg of sodium per serving fit into this eating plan.  To find whole grains, look for the word "whole" as the first word in the ingredient list. Shopping  Buy products labeled as "low-sodium" or "no salt added."  Buy fresh foods. Avoid canned foods and premade or frozen meals. Cooking  Avoid adding salt when cooking. Use salt-free seasonings or herbs instead of table salt or sea salt. Check with your health care provider or pharmacist before using salt substitutes.  Do not fry  foods. Cook foods using healthy methods such as baking, boiling, grilling, and broiling instead.  Cook with heart-healthy oils, such as olive, canola, soybean, or sunflower oil. Meal planning  Eat a balanced diet that includes: ? 5 or more servings of fruits and vegetables each day. At each meal, try to fill half of your plate with fruits and vegetables. ? Up to 6-8 servings of whole grains each day. ? Less than 6 oz of lean meat, poultry, or fish each day. A 3-oz serving of meat is about the same size as a deck of cards. One egg equals 1 oz. ? 2 servings of low-fat dairy each day. ? A serving of nuts, seeds, or beans 5 times each week. ? Heart-healthy fats. Healthy fats called Omega-3 fatty acids are found in foods such as flaxseeds and coldwater fish, like sardines, salmon, and mackerel.  Limit how much you eat of the following: ? Canned or prepackaged foods. ? Food that is high in trans fat, such as fried foods. ? Food that is high in saturated fat, such as fatty meat. ? Sweets, desserts, sugary drinks, and other foods with added sugar. ? Full-fat dairy products.  Do not salt foods before eating.  Try to eat at least 2 vegetarian meals each week.  Eat more home-cooked food and less restaurant, buffet, and fast food.  When eating at a restaurant, ask that your food be prepared with less salt  or no salt, if possible. What foods are recommended? The items listed may not be a complete list. Talk with your dietitian about what dietary choices are best for you. Grains Whole-grain or whole-wheat bread. Whole-grain or whole-wheat pasta. Brown rice. [name]. Bulgur. Whole-grain and low-sodium cereals. Pita bread. Low-fat, low-sodium crackers. Whole-wheat flour tortillas. Vegetables Fresh or frozen vegetables (raw, steamed, roasted, or grilled). Low-sodium or reduced-sodium tomato and vegetable juice. Low-sodium or reduced-sodium tomato sauce and tomato paste. Low-sodium or  reduced-sodium canned vegetables. Fruits All fresh, dried, or frozen fruit. Canned fruit in natural juice (without added sugar). Meat and other protein foods Skinless chicken or [place]. Ground chicken or [place]. Pork with fat trimmed off. Fish and seafood. Egg whites. Dried beans, peas, or lentils. Unsalted nuts, nut butters, and seeds. Unsalted canned beans. Lean cuts of beef with fat trimmed off. Low-sodium, lean deli meat. Dairy Low-fat (1%) or fat-free (skim) milk. Fat-free, low-fat, or reduced-fat cheeses. Nonfat, low-sodium ricotta or cottage cheese. Low-fat or nonfat yogurt. Low-fat, low-sodium cheese. Fats and oils Soft margarine without trans fats. Vegetable oil. Low-fat, reduced-fat, or light mayonnaise and salad dressings (reduced-sodium). Canola, safflower, olive, soybean, and sunflower oils. Avocado. Seasoning and other foods Herbs. Spices. Seasoning mixes without salt. Unsalted popcorn and pretzels. Fat-free sweets. What foods are not recommended? The items listed may not be a complete list. Talk with your dietitian about what dietary choices are best for you. Grains Baked goods made with fat, such as croissants, muffins, or some breads. Dry pasta or rice meal packs. Vegetables Creamed or fried vegetables. Vegetables in a cheese sauce. Regular canned vegetables (not low-sodium or reduced-sodium). Regular canned tomato sauce and paste (not low-sodium or reduced-sodium). Regular tomato and vegetable juice (not low-sodium or reduced-sodium). [name]. Olives. Fruits Canned fruit in a light or heavy syrup. Fried fruit. Fruit in cream or butter sauce. Meat and other protein foods Fatty cuts of meat. Ribs. Fried meat. [name]. Sausage. Bologna and other processed lunch meats. Salami. Fatback. Hotdogs. Bratwurst. Salted nuts and seeds. Canned beans with added salt. Canned or smoked fish. Whole eggs or egg yolks. Chicken or [place] with skin. Dairy Whole or 2% milk, cream, and half-and-half.  Whole or full-fat cream cheese. Whole-fat or sweetened yogurt. Full-fat cheese. Nondairy creamers. Whipped toppings. Processed cheese and cheese spreads. Fats and oils Butter. Stick margarine. Lard. Shortening. Ghee. Bacon fat. Tropical oils, such as coconut, palm kernel, or palm oil. Seasoning and other foods Salted popcorn and pretzels. Onion salt, garlic salt, seasoned salt, table salt, and sea salt. Worcestershire sauce. Tartar sauce. Barbecue sauce. Teriyaki sauce. Soy sauce, including reduced-sodium. Steak sauce. Canned and packaged gravies. Fish sauce. Oyster sauce. Cocktail sauce. Horseradish that you find on the shelf. Ketchup. Mustard. Meat flavorings and tenderizers. Bouillon cubes. Hot sauce and Tabasco sauce. Premade or packaged marinades. Premade or packaged taco seasonings. Relishes. Regular salad dressings. Where to find more information:  National Heart, Lung, and [place]: https://wilson-eaton.com/  American Heart Association: www.heart.org Summary  The DASH eating plan is a healthy eating plan that has been shown to reduce high blood pressure (hypertension). It may also reduce your risk for type 2 diabetes, heart disease, and stroke.  With the DASH eating plan, you should limit salt (sodium) intake to 2,300 mg a day. If you have hypertension, you may need to reduce your sodium intake to 1,500 mg a day.  When on the DASH eating plan, aim to eat more fresh fruits and vegetables, whole grains, lean proteins, low-fat dairy,  and heart-healthy fats.  Work with your health care provider or diet and nutrition specialist (dietitian) to adjust your eating plan to your individual calorie needs. This information is not intended to replace advice given to you by your health care provider. Make sure you discuss any questions you have with your health care provider. Document Revised: 01/28/2017 Document Reviewed: 02/09/2016 Elsevier Patient Education  2020 [employer].

## 2019-05-15 MED FILL — ELIQUIS 5 MG TABLET: 5 | 30 days supply | Qty: 60 | Fill #4

## 2019-06-04 ENCOUNTER — Encounter: Payer: Medicare Other | Admitting: Nurse Practitioner

## 2019-06-05 DIAGNOSIS — L814 Other melanin hyperpigmentation: Secondary | ICD-10-CM | POA: Diagnosis not present

## 2019-06-05 DIAGNOSIS — L821 Other seborrheic keratosis: Secondary | ICD-10-CM | POA: Diagnosis not present

## 2019-06-05 DIAGNOSIS — D1801 Hemangioma of skin and subcutaneous tissue: Secondary | ICD-10-CM | POA: Diagnosis not present

## 2019-06-05 DIAGNOSIS — D229 Melanocytic nevi, unspecified: Secondary | ICD-10-CM | POA: Diagnosis not present

## 2019-06-05 DIAGNOSIS — L905 Scar conditions and fibrosis of skin: Secondary | ICD-10-CM | POA: Diagnosis not present

## 2019-06-05 DIAGNOSIS — Z85828 Personal history of other malignant neoplasm of skin: Secondary | ICD-10-CM | POA: Diagnosis not present

## 2019-06-06 MED FILL — MYRBETRIQ ER 50 MG TABLET: 50 | 30 days supply | Qty: 30 | Fill #1

## 2019-06-07 ENCOUNTER — Encounter: Payer: Medicare Other | Admitting: Nurse Practitioner

## 2019-06-08 DIAGNOSIS — J385 Laryngeal spasm: Secondary | ICD-10-CM | POA: Diagnosis not present

## 2019-06-11 ENCOUNTER — Encounter: Payer: Medicare Other | Admitting: Nurse Practitioner

## 2019-06-18 ENCOUNTER — Other Ambulatory Visit: Payer: Medicare Other

## 2019-06-18 ENCOUNTER — Other Ambulatory Visit: Payer: Self-pay

## 2019-06-18 DIAGNOSIS — I10 Essential (primary) hypertension: Secondary | ICD-10-CM

## 2019-06-18 LAB — CBC WITH DIFFERENTIAL/PLATELET
Absolute Monocytes: 440 cells/uL (ref 200–950)
Basophils Absolute: 58 cells/uL (ref 0–200)
Basophils Relative: 1.1 %
Eosinophils Absolute: 249 cells/uL (ref 15–500)
Eosinophils Relative: 4.7 %
HCT: 36.9 % (ref 35.0–45.0)
Hemoglobin: 12.5 g/dL (ref 11.7–15.5)
Lymphs Abs: 1913 cells/uL (ref 850–3900)
MCH: 33.2 pg — ABNORMAL HIGH (ref 27.0–33.0)
MCHC: 33.9 g/dL (ref 32.0–36.0)
MCV: 98.1 fL (ref 80.0–100.0)
MPV: 10.1 fL (ref 7.5–12.5)
Monocytes Relative: 8.3 %
Neutro Abs: 2639 cells/uL (ref 1500–7800)
Neutrophils Relative %: 49.8 %
Platelets: 159 10*3/uL (ref 140–400)
RBC: 3.76 10*6/uL — ABNORMAL LOW (ref 3.80–5.10)
RDW: 12.6 % (ref 11.0–15.0)
Total Lymphocyte: 36.1 %
WBC: 5.3 10*3/uL (ref 3.8–10.8)

## 2019-06-18 LAB — COMPLETE METABOLIC PANEL WITH GFR
AG Ratio: 2.3 (calc) (ref 1.0–2.5)
ALT: 14 U/L (ref 6–29)
AST: 18 U/L (ref 10–35)
Albumin: 4.2 g/dL (ref 3.6–5.1)
Alkaline phosphatase (APISO): 57 U/L (ref 37–153)
BUN/Creatinine Ratio: 20 (calc) (ref 6–22)
BUN: 20 mg/dL (ref 7–25)
CO2: 29 mmol/L (ref 20–32)
Calcium: 9.9 mg/dL (ref 8.6–10.4)
Chloride: 105 mmol/L (ref 98–110)
Creat: 1.01 mg/dL — ABNORMAL HIGH (ref 0.60–0.88)
GFR, Est African American: 58 mL/min/{1.73_m2} — ABNORMAL LOW (ref >=60)
GFR, Est Non African American: 50 mL/min/{1.73_m2} — ABNORMAL LOW (ref >=60)
Globulin: 1.8 g/dL (calc) — ABNORMAL LOW (ref 1.9–3.7)
Glucose, Bld: 96 mg/dL (ref 65–99)
Potassium: 4.1 mmol/L (ref 3.5–5.3)
Sodium: 140 mmol/L (ref 135–146)
Total Bilirubin: 0.6 mg/dL (ref 0.2–1.2)
Total Protein: 6 g/dL — ABNORMAL LOW (ref 6.1–8.1)

## 2019-06-18 LAB — LIPID PANEL
Cholesterol: 147 mg/dL (ref <200)
HDL: 58 mg/dL (ref >=50)
LDL Cholesterol (Calc): 75 mg/dL (calc)
Non-HDL Cholesterol (Calc): 89 mg/dL (calc) (ref <130)
Total CHOL/HDL Ratio: 2.5 (calc) (ref <5.0)
Triglycerides: 60 mg/dL (ref <150)

## 2019-06-20 ENCOUNTER — Ambulatory Visit (INDEPENDENT_AMBULATORY_CARE_PROVIDER_SITE_OTHER): Payer: Medicare Other | Admitting: Nurse Practitioner

## 2019-06-20 ENCOUNTER — Ambulatory Visit: Payer: Medicare Other | Admitting: Nurse Practitioner

## 2019-06-20 ENCOUNTER — Encounter: Payer: Self-pay | Admitting: Nurse Practitioner

## 2019-06-20 ENCOUNTER — Other Ambulatory Visit: Payer: Self-pay

## 2019-06-20 VITALS — BP 180/100 | HR 71 | Temp 97.1°F | Ht 63.0 in | Wt 151.0 lb

## 2019-06-20 DIAGNOSIS — G44309 Post-traumatic headache, unspecified, not intractable: Secondary | ICD-10-CM | POA: Diagnosis not present

## 2019-06-20 DIAGNOSIS — I480 Paroxysmal atrial fibrillation: Secondary | ICD-10-CM | POA: Diagnosis not present

## 2019-06-20 DIAGNOSIS — Z7189 Other specified counseling: Secondary | ICD-10-CM

## 2019-06-20 DIAGNOSIS — K219 Gastro-esophageal reflux disease without esophagitis: Secondary | ICD-10-CM

## 2019-06-20 DIAGNOSIS — J449 Chronic obstructive pulmonary disease, unspecified: Secondary | ICD-10-CM

## 2019-06-20 DIAGNOSIS — N183 Chronic kidney disease, stage 3 unspecified: Secondary | ICD-10-CM | POA: Diagnosis not present

## 2019-06-20 DIAGNOSIS — N3281 Overactive bladder: Secondary | ICD-10-CM

## 2019-06-20 DIAGNOSIS — I10 Essential (primary) hypertension: Secondary | ICD-10-CM | POA: Diagnosis not present

## 2019-06-20 NOTE — Progress Notes (Signed)
Careteam: [name] Care Team: Lauree Chandler, NP as PCP - General (Geriatric Medicine) Druscilla Brownie, MD as Consulting Physician (Dermatology) [name] Irani, MD (Inactive) as Consulting Physician (Gastroenterology) Neldon Mc, MD as Consulting Physician (General Surgery) Lorretta Harp, MD as Consulting Physician (Cardiology) Bjorn Loser, MD as Consulting Physician (Urology) Lavell Anchors, MD as Consulting Physician (Ophthalmology) Pieter Partridge, DO as Consulting Physician (Neurology)  PLACE OF SERVICE:  [place]  Advanced Directive information    Allergies  Allergen Reactions  . Lisinopril Cough  . Codeine Rash    All over the body.    Chief Complaint  [name] presents with  . Medical Management of Chronic Issues    6 month follow-up      HPI: [name] is a 86 y.o. female for routine follow up.   Overactive bladder-restarted myrbetriq and seeing improvement.   HTN-elevated today in office home bp ranging from 114-160/66-85  LE edema-stable at this time.   COPD/Chronic cough- stable. No worsening of symptoms. Continues on symicort, mucinex   A fib- stable, no palpitations.continues on eliquis BID, no signs of bleeding.   GERD-controlled on omeprazole and prilosec    Review of Systems:  Review of Systems  Constitutional: Negative for chills, fever and weight loss.  HENT: Negative for tinnitus.   Respiratory: Negative for cough, sputum production and shortness of breath.   Cardiovascular: Negative for chest pain, palpitations and leg swelling.  Gastrointestinal: Negative for abdominal pain, constipation, diarrhea and heartburn.  Genitourinary: Negative for dysuria, frequency and urgency.  Musculoskeletal: Negative for back pain, falls, joint pain and myalgias.  Skin: Negative.   Neurological: Negative for dizziness and headaches.  Psychiatric/Behavioral: Negative for depression and memory loss. The [name] does not have insomnia.      Past Medical History:  Diagnosis Date  . Aortic stenosis 07/20/2012   Aortic stenosis   . Aortic valve disorder 08/13/2011   ECHO - EF 123456; mild diastolic dysfunction; calcified aortic valve, not well visualized; mod/severe aortic stenosis w/ worsening gradients when compared to 2012  . Arthritis   . Cancer ([place])    Breast  . Carotid bruit 08/06/2008   Doppler - R ECA demonstrates noarrowing w/ elevated velocities consistent w/ >70% diameter reduction; R and L ICAs show no evidence of diameter reduction, significant tortuosity or vascular abnormality;   . Change in voice   . COPD (chronic obstructive pulmonary disease) ([place])   . Hearing loss   . Heart murmur   . Hyperlipidemia   . Hypertension    dr Gwenlyn Found  . Osteoporosis   . PAF (paroxysmal atrial fibrillation) ([place]) 09/04/2012  . PVD (peripheral vascular disease) ([place]) 08/13/2010   R/P MV - normal pattern of perfusion in all regions, EF 76%; no significant wall abnormalities noted; normal perfusion study  . Right carotid bruit    high-grade right external carotid artery stenosis by 2 parts ultrasound 2 years ago  . S/P aortic valve replacement with bioprosthetic valve 08/30/2012   21 mm [employer] Ease bovine pericardial tissue valve   Past Surgical History:  Procedure Laterality Date  . ABDOMINAL HYSTERECTOMY     Partial  . AORTIC VALVE REPLACEMENT N/A 08/30/2012   Procedure: AORTIC VALVE REPLACEMENT (AVR);  Surgeon: Rexene Alberts, MD;  Location: [place];  Service: Open Heart Surgery;  Laterality: N/A;  . BIOPSY SHOULDER Left 10/08/2005   shave biopsy  . BREAST LUMPECTOMY  02/25/1997   right  . CARDIAC CATHETERIZATION    . COLON  SURGERY  1959  . COSMETIC SURGERY Left 1997   Breast implant  . EYE SURGERY  1997   Cataract surgery  . INTRAOPERATIVE TRANSESOPHAGEAL ECHOCARDIOGRAM N/A 08/30/2012   Procedure: INTRAOPERATIVE TRANSESOPHAGEAL ECHOCARDIOGRAM;  Surgeon: Rexene Alberts, MD;  Location: [facility];  Service: Open Heart  Surgery;  Laterality: N/A;  . LEFT AND RIGHT HEART CATHETERIZATION WITH CORONARY ANGIOGRAM N/A 08/14/2012   Procedure: LEFT AND RIGHT HEART CATHETERIZATION WITH CORONARY ANGIOGRAM;  Surgeon: Lorretta Harp, MD;  Location[facility] C[facility];  Service: Cardiovascular;  Laterality: N/A;  . LEFT HEART CATH  08/14/12   Nl cors, AS  . MASTECTOMY  09/29/95   left  . PARTIAL HYSTERECTOMY    . TOTAL HIP ARTHROPLASTY  09/2010   Social History:   reports that she has never smoked. She has never used smokeless tobacco. She reports that she does not drink alcohol or use drugs.  Family History  Problem Relation Age of Onset  . Cancer Mother        Bladder  . Early death Father        Tractor accident  . Early death Brother        Radiation protection practitioner  . Early death Brother        during heart surgery    Medications: [name]'s Medications  New Prescriptions   No medications on file  Previous Medications   ACETAMINOPHEN (TYLENOL) 325 MG TABLET    Take 650 mg by mouth at bedtime.   ACETAMINOPHEN (TYLENOL) 650 MG CR TABLET    Take 650 mg by mouth. 1 a day every morning   ALBUTEROL (PROAIR HFA) 108 (90 BASE) MCG/ACT INHALER    INHALE 1 PUFF INTO THE LUNGS EVERY 6 (SIX) HOURS AS NEEDED FOR WHEEZING OR SHORTNESS OF BREATH.   AMOXICILLIN (AMOXIL) 500 MG TABLET    Take 4 by mouth 1 hour prior to dental procedure   APIXABAN (ELIQUIS) 5 MG TABS TABLET    Take 1 tablet (5 mg total) by mouth 2 (two) times daily.   ASCORBIC ACID (VITAMIN C) 100 MG TABLET    Take 100 mg by mouth daily.   BIOTIN 5000 MCG CAPS    Take 1 capsule by mouth daily.   BUDESONIDE-FORMOTEROL (SYMBICORT) 80-4.5 MCG/ACT INHALER    2 puffs each am   CALCIUM CITRATE-VITAMIN D (CITRACAL + D PO)    Take 1 tablet by mouth 2 (two) times daily.    CHLORPHENIRAMINE MALEATE (EQ CHLORTABS PO)    Take 1 tablet by mouth every 4 (four) hours as needed.   DEXTROMETHORPHAN-GUAIFENESIN (MUCINEX DM) 30-600 MG 12HR TABLET    Take 1 tablet by mouth 2 (two) times daily  as needed.    FAMOTIDINE (PEPCID) 20 MG TABLET    One after supper   FLUTICASONE (FLONASE) 50 MCG/ACT NASAL SPRAY    Place 1 spray into both nostrils 2 (two) times daily.   HYDROXYZINE (ATARAX/VISTARIL) 25 MG TABLET    Take one tablet by mouth daily as needed for itching.   LOSARTAN (COZAAR) 50 MG TABLET    Take 1 tablet (50 mg total) by mouth daily.   MAGNESIUM HYDROXIDE (PHILLIPS MILK OF MAGNESIA PO)    Take 15 mL by mouth. Take daily at 6 o'clock at night   METOPROLOL TARTRATE (LOPRESSOR) 25 MG TABLET    TAKE 1 TABLET BY MOUTH TWICE A DAY   MIRABEGRON ER (MYRBETRIQ) 50 MG TB24 TABLET    Take 1 tablet (50 mg total)  by mouth daily.   OMEGA-3 FATTY ACIDS (FISH OIL PO)    Take 1 capsule by mouth daily.   OMEPRAZOLE (PRILOSEC) 20 MG CAPSULE    Take 20 mg by mouth daily.   SHARK LIVER OIL-COCOA BUTTER (PREPARATION H) 0.25-3-85.5 % SUPPOSITORY    Place 1 suppository rectally as needed.   VITAMIN E 400 UNIT CAPSULE    Take 400 Units by mouth daily.  Modified Medications   No medications on file  Discontinued Medications   No medications on file    Physical Exam:  Vitals:   06/20/19 1529  BP: (!) 182/100  Pulse: 71  Temp: (!) 97.1 F (36.2 C)  TempSrc: Temporal  SpO2: 98%  Weight: 151 lb (68.5 kg)  Height: 5\' 3"  (1.6 m)   Body mass index is 26.75 kg/m. Wt Readings from Last 3 Encounters:  06/20/19 151 lb (68.5 kg)  05/14/19 148 lb 3.2 oz (67.2 kg)  05/03/19 145 lb (65.8 kg)    Physical Exam Constitutional:      General: She is not in acute distress.    Appearance: She is well-developed. She is not diaphoretic.  HENT:     Head: Normocephalic and atraumatic.     Mouth/Throat:     Pharynx: No oropharyngeal exudate.  Eyes:     Conjunctiva/sclera: Conjunctivae normal.     Pupils: Pupils are equal, round, and reactive to light.  Cardiovascular:     Rate and Rhythm: Normal rate and regular rhythm.     Heart sounds: Normal heart sounds.  Pulmonary:     Effort: Pulmonary effort  is normal.     Breath sounds: Normal breath sounds.  Abdominal:     General: Bowel sounds are normal.     Palpations: Abdomen is soft.  Musculoskeletal:        General: No tenderness.     Cervical back: Normal range of motion and neck supple.  Skin:    General: Skin is warm and dry.  Neurological:     Mental Status: She is alert and oriented to person, place, and time.     Labs reviewed: Basic Metabolic Panel: Recent Labs    08/22/18 1630 12/20/18 1605 06/18/19 0813  NA 138 137 140  K 3.9 4.6 4.1  CL 101 102 105  CO2 29 28 29   GLUCOSE 87 94 96  BUN 16 16 20   CREATININE 0.97 1.23* 1.01*  CALCIUM 9.9 10.2 9.9  TSH 1.13  --   --    Liver Function Tests: Recent Labs    12/20/18 1605 06/18/19 0813  AST 21 18  ALT 14 14  BILITOT 0.6 0.6  PROT 5.8* 6.0*   No results for input(s): LIPASE, AMYLASE in the last 8760 hours. No results for input(s): AMMONIA in the last 8760 hours. CBC: Recent Labs    08/22/18 1630 12/20/18 1605 06/18/19 0813  WBC 7.2 7.2 5.3  NEUTROABS 4.4 4,558 2,639  HGB 13.0 12.3 12.5  HCT 38.5 36.3 36.9  MCV 97.9 97.1 98.1  PLT 165.0 159 159   Lipid Panel: Recent Labs    06/18/19 0813  CHOL 147  HDL 58  LDLCALC 75  TRIG 60  CHOLHDL 2.5   TSH: Recent Labs    08/22/18 1630  TSH 1.13   A1C: Lab Results  Component Value Date   HGBA1C 5.5 08/28/2012     Assessment/Plan 1. Advanced care planning/counseling discussion -went over MOST form, DNR vs FULL code.  - pt would like to remain  a Full code at this time.  2. Post-traumatic headache, not intractable, unspecified chronicity pattern -improved at this time.  3. Essential hypertension -elevated today but home blood pressures are better controlled mostly staying at goal. She is currently on losartan 50 mg daily with metoprolol 25 mg BID. Pt does not want adjustment to medication at this which is reasonable with her home readings being better controlled. Plan to go home and check  blood pressure tonight and tomorrow and to notify office. Continue lifestyle modifications. Goal bp <140/90  4. Overactive bladder -improvement with myrbetriq.   5. Stage 3 chronic kidney disease, unspecified whether stage 3a or 3b CKD -stable. Encourage proper hydration and to avoid NSAIDS (Aleve, Advil, Motrin, Ibuprofen)   6. PAF (paroxysmal atrial fibrillation) (H[facility]) Stable, rate controlled. Continues on eliquis 5 mg BID, no signs of bruising or bleeding, hgb stable. Continues on lopressor.   7. Gastroesophageal reflux disease without esophagitis Stable on pepcid and omeprazole.   8. Chronic obstructive pulmonary disease, unspecified COPD type ([employer]) COPD remains stable. No recent flair, will continue on current regimen.  Next appt: 6 months for routine follow up [place]. [place], [facility] [phone]

## 2019-06-21 ENCOUNTER — Telehealth: Payer: Self-pay | Admitting: Nurse Practitioner

## 2019-06-21 NOTE — Telephone Encounter (Signed)
Noted thank you

## 2019-06-21 NOTE — Telephone Encounter (Signed)
Incoming call received from patient stating her B/P last night was 158/84, today after breakfast 182/78, and right now 168/84.   Patient aware she only needs to call if B/P remains elevated as it was yesterday at office visit. Patient verbalized understanding.

## 2019-06-21 NOTE — Telephone Encounter (Signed)
Called pt to follow up on her blood pressure from elevated office visit on 4/21. She reports bp improved at home and was 143/83. She has not taken her blood pressure medication today.

## 2019-06-25 MED FILL — ELIQUIS 5 MG TABLET: 5 | 30 days supply | Qty: 60 | Fill #5

## 2019-07-02 MED FILL — MYRBETRIQ ER 50 MG TABLET: 50 | 30 days supply | Qty: 30 | Fill #2

## 2019-07-03 ENCOUNTER — Other Ambulatory Visit: Payer: Self-pay

## 2019-07-03 ENCOUNTER — Ambulatory Visit (INDEPENDENT_AMBULATORY_CARE_PROVIDER_SITE_OTHER): Payer: Medicare Other | Admitting: Nurse Practitioner

## 2019-07-03 ENCOUNTER — Encounter: Payer: Self-pay | Admitting: Nurse Practitioner

## 2019-07-03 DIAGNOSIS — Z0000 Encounter for general adult medical examination without abnormal findings: Secondary | ICD-10-CM

## 2019-07-03 NOTE — Progress Notes (Signed)
   This service is provided via telemedicine  No vital signs collected/recorded due to the encounter was a telemedicine visit.   Location of patient (ex: home, work):  Home  Patient consents to a telephone visit:  Yes, see telephone encounter with annual consent dated 07/03/2019  Location of the provider (ex: office, home):  [place]  Name of any referring provider:  N/A  Names of all persons participating in the telemedicine service and their role in the encounter:  Sherrie Mustache, Nurse Practitioner, Carroll Kinds, CMA, and patient.   Time spent on call:  8 Minutes with medical assistant

## 2019-07-03 NOTE — Patient Instructions (Signed)
[name] , Thank you for taking time to come for your Medicare Wellness Visit. I appreciate your ongoing commitment to your health goals. Please review the following plan we discussed and let me know if I can assist you in the future.   Screening recommendations/referrals: Colonoscopy aged out Mammogram up to date Bone Density up to date Recommended yearly ophthalmology/optometry visit for glaucoma screening and checkup Recommended yearly dental visit for hygiene and checkup  Vaccinations: Influenza vaccine due September 2021 Pneumococcal vaccine up to date Tdap vaccine up to date Shingles vaccine up to date    Advanced directives: recommended to complete and bring to office to place on file.   Conditions/risks identified: risk for cardiovascular disease due to family history  Next appointment: 1 year.    Preventive Care 26 Years and Older, Female Preventive care refers to lifestyle choices and visits with your health care provider that can promote health and wellness. What does preventive care include?  A yearly physical exam. This is also called an annual well check.  Dental exams once or twice a year.  Routine eye exams. Ask your health care provider how often you should have your eyes checked.  Personal lifestyle choices, including:  Daily care of your teeth and gums.  Regular physical activity.  Eating a healthy diet.  Avoiding tobacco and drug use.  Limiting alcohol use.  Practicing safe sex.  Taking low-dose aspirin every day.  Taking vitamin and mineral supplements as recommended by your health care provider. What happens during an annual well check? The services and screenings done by your health care provider during your annual well check will depend on your age, overall health, lifestyle risk factors, and family history of disease. Counseling  Your health care provider may ask you questions about your:  Alcohol use.  Tobacco use.  Drug use.   Emotional well-being.  Home and relationship well-being.  Sexual activity.  Eating habits.  History of falls.  Memory and ability to understand (cognition).  Work and work Statistician.  Reproductive health. Screening  You may have the following tests or measurements:  Height, weight, and BMI.  Blood pressure.  Lipid and cholesterol levels. These may be checked every 5 years, or more frequently if you are over 15 years old.  Skin check.  Lung cancer screening. You may have this screening every year starting at age 24 if you have a 30-pack-year history of smoking and currently smoke or have quit within the past 15 years.  Fecal occult blood test (FOBT) of the stool. You may have this test every year starting at age 24.  Flexible sigmoidoscopy or colonoscopy. You may have a sigmoidoscopy every 5 years or a colonoscopy every 10 years starting at age 25.  Hepatitis C blood test.  Hepatitis B blood test.  Sexually transmitted disease (STD) testing.  Diabetes screening. This is done by checking your blood sugar (glucose) after you have not eaten for a while (fasting). You may have this done every 1-3 years.  Bone density scan. This is done to screen for osteoporosis. You may have this done starting at age 29.  Mammogram. This may be done every 1-2 years. Talk to your health care provider about how often you should have regular mammograms. Talk with your health care provider about your test results, treatment options, and if necessary, the need for more tests. Vaccines  Your health care provider may recommend certain vaccines, such as:  Influenza vaccine. This is recommended every year.  Tetanus,  diphtheria, and acellular pertussis (Tdap, Td) vaccine. You may need a Td booster every 10 years.  Zoster vaccine. You may need this after age 31.  Pneumococcal 13-valent conjugate (PCV13) vaccine. One dose is recommended after age 62.  Pneumococcal polysaccharide (PPSV23)  vaccine. One dose is recommended after age 83. Talk to your health care provider about which screenings and vaccines you need and how often you need them. This information is not intended to replace advice given to you by your health care provider. Make sure you discuss any questions you have with your health care provider. Document Released: 03/14/2015 Document Revised: 11/05/2015 Document Reviewed: 12/17/2014 Elsevier Interactive Patient Education  2017 [place] Prevention in the Home Falls can cause injuries. They can happen to people of all ages. There are many things you can do to make your home safe and to help prevent falls. What can I do on the outside of my home?  Regularly fix the edges of walkways and driveways and fix any cracks.  Remove anything that might make you trip as you walk through a door, such as a raised step or threshold.  Trim any bushes or trees on the path to your home.  Use bright outdoor lighting.  Clear any walking paths of anything that might make someone trip, such as rocks or tools.  Regularly check to see if handrails are loose or broken. Make sure that both sides of any steps have handrails.  Any raised decks and porches should have guardrails on the edges.  Have any leaves, snow, or ice cleared regularly.  Use sand or salt on walking paths during winter.  Clean up any spills in your garage right away. This includes oil or grease spills. What can I do in the bathroom?  Use night lights.  Install grab bars by the toilet and in the tub and shower. Do not use towel bars as grab bars.  Use non-skid mats or decals in the tub or shower.  If you need to sit down in the shower, use a plastic, non-slip stool.  Keep the floor dry. Clean up any water that spills on the floor as soon as it happens.  Remove soap buildup in the tub or shower regularly.  Attach bath mats securely with double-sided non-slip rug tape.  Do not have throw rugs  and other things on the floor that can make you trip. What can I do in the bedroom?  Use night lights.  Make sure that you have a light by your bed that is easy to reach.  Do not use any sheets or blankets that are too big for your bed. They should not hang down onto the floor.  Have a firm chair that has side arms. You can use this for support while you get dressed.  Do not have throw rugs and other things on the floor that can make you trip. What can I do in the kitchen?  Clean up any spills right away.  Avoid walking on wet floors.  Keep items that you use a lot in easy-to-reach places.  If you need to reach something above you, use a strong step stool that has a grab bar.  Keep electrical cords out of the way.  Do not use floor polish or wax that makes floors slippery. If you must use wax, use non-skid floor wax.  Do not have throw rugs and other things on the floor that can make you trip. What can I do with  my stairs?  Do not leave any items on the stairs.  Make sure that there are handrails on both sides of the stairs and use them. Fix handrails that are broken or loose. Make sure that handrails are as long as the stairways.  Check any carpeting to make sure that it is firmly attached to the stairs. Fix any carpet that is loose or worn.  Avoid having throw rugs at the top or bottom of the stairs. If you do have throw rugs, attach them to the floor with carpet tape.  Make sure that you have a light switch at the top of the stairs and the bottom of the stairs. If you do not have them, ask someone to add them for you. What else can I do to help prevent falls?  Wear shoes that:  Do not have high heels.  Have rubber bottoms.  Are comfortable and fit you well.  Are closed at the toe. Do not wear sandals.  If you use a stepladder:  Make sure that it is fully opened. Do not climb a closed stepladder.  Make sure that both sides of the stepladder are locked into  place.  Ask someone to hold it for you, if possible.  Clearly mark and make sure that you can see:  Any grab bars or handrails.  First and last steps.  Where the edge of each step is.  Use tools that help you move around (mobility aids) if they are needed. These include:  Canes.  Walkers.  Scooters.  Crutches.  Turn on the lights when you go into a dark area. Replace any light bulbs as soon as they burn out.  Set up your furniture so you have a clear path. Avoid moving your furniture around.  If any of your floors are uneven, fix them.  If there are any pets around you, be aware of where they are.  Review your medicines with your doctor. Some medicines can make you feel dizzy. This can increase your chance of falling. Ask your doctor what other things that you can do to help prevent falls. This information is not intended to replace advice given to you by your health care provider. Make sure you discuss any questions you have with your health care provider. Document Released: 12/12/2008 Document Revised: 07/24/2015 Document Reviewed: 03/22/2014 Elsevier Interactive Patient Education  2017 [employer].

## 2019-07-03 NOTE — Progress Notes (Signed)
Subjective:   [name] is a 86 y.o. female who presents for Medicare Annual (Subsequent) preventive examination.  Review of Systems:   Cardiac Risk Factors include: family history of premature cardiovascular disease;advanced age (>12men, >36 women);hypertension     Objective:     Vitals: There were no vitals taken for this visit.  There is no height or weight on file to calculate BMI.  Advanced Directives 07/03/2019 06/20/2019 05/03/2019 08/31/2018 05/31/2018 04/19/2018 11/08/2017  Does Patient Have a Medical Advance Directive? No No No No No No No  Would patient like information on creating a medical advance directive? Yes (MAU/Ambulatory/Procedural Areas - Information given) Yes (MAU/Ambulatory/Procedural Areas - Information given) - No - Patient declined Yes (MAU/Ambulatory/Procedural Areas - Information given) Yes (MAU/Ambulatory/Procedural Areas - Information given) -  Pre-existing out of facility DNR order (yellow form or pink MOST form) - - - - - - -    Tobacco Social History   Tobacco Use  Smoking Status Never Smoker  Smokeless Tobacco Never Used     Counseling given: Not Answered   Clinical Intake:  Pre-visit preparation completed: Yes  Pain : No/denies pain     BMI - recorded: 26.75 Nutritional Status: BMI 25 -29 Overweight Nutritional Risks: None Diabetes: No  How often do you need to have someone help you when you read instructions, pamphlets, or other written materials from your doctor or pharmacy?: 2 - Rarely        Past Medical History:  Diagnosis Date  . Aortic stenosis 07/20/2012   Aortic stenosis   . Aortic valve disorder 08/13/2011   ECHO - EF 123456; mild diastolic dysfunction; calcified aortic valve, not well visualized; mod/severe aortic stenosis w/ worsening gradients when compared to 2012  . Arthritis   . Cancer ([place])    Breast  . Carotid bruit 08/06/2008   Doppler - R ECA demonstrates noarrowing w/ elevated velocities consistent w/ >70%  diameter reduction; R and L ICAs show no evidence of diameter reduction, significant tortuosity or vascular abnormality;   . Change in voice   . COPD (chronic obstructive pulmonary disease) ([place])   . Hearing loss   . Heart murmur   . Hyperlipidemia   . Hypertension    dr Gwenlyn Found  . Osteoporosis   . PAF (paroxysmal atrial fibrillation) ([place]) 09/04/2012  . PVD (peripheral vascular disease) ([place]) 08/13/2010   R/P MV - normal pattern of perfusion in all regions, EF 76%; no significant wall abnormalities noted; normal perfusion study  . Right carotid bruit    high-grade right external carotid artery stenosis by 2 parts ultrasound 2 years ago  . S/P aortic valve replacement with bioprosthetic valve 08/30/2012   21 mm [facility] Ease bovine pericardial tissue valve   Past Surgical History:  Procedure Laterality Date  . ABDOMINAL HYSTERECTOMY     Partial  . AORTIC VALVE REPLACEMENT N/A 08/30/2012   Procedure: AORTIC VALVE REPLACEMENT (AVR);  Surgeon: Rexene Alberts, MD;  Location: [place];  Service: Open Heart Surgery;  Laterality: N/A;  . BIOPSY SHOULDER Left 10/08/2005   shave biopsy  . BREAST LUMPECTOMY  02/25/1997   right  . CARDIAC CATHETERIZATION    . [place]  . COSMETIC SURGERY Left 1997   Breast implant  . EYE SURGERY  1997   Cataract surgery  . INTRAOPERATIVE TRANSESOPHAGEAL ECHOCARDIOGRAM N/A 08/30/2012   Procedure: INTRAOPERATIVE TRANSESOPHAGEAL ECHOCARDIOGRAM;  Surgeon: Rexene Alberts, MD;  Location: [place];  Service: Open Heart Surgery;  Laterality: N/A;  . LEFT AND RIGHT HEART CATHETERIZATION WITH CORONARY ANGIOGRAM N/A 08/14/2012   Procedure: LEFT AND RIGHT HEART CATHETERIZATION WITH CORONARY ANGIOGRAM;  Surgeon: Lorretta Harp, MD;  [facility] C[facility] LAB;  Service: Cardiovascular;  Laterality: N/A;  . LEFT HEART CATH  08/14/12   Nl cors, AS  . MASTECTOMY  09/29/95   left  . PARTIAL HYSTERECTOMY    . TOTAL HIP ARTHROPLASTY  09/2010   Family History  Problem  Relation Age of Onset  . Cancer Mother        Bladder  . Early death Father        Tractor accident  . Early death Brother        Radiation protection practitioner  . Early death Brother        during heart surgery   Social History   Socioeconomic History  . Marital status: Widowed    Spouse name: Not on file  . Number of children: 2  . Years of education: 9  . Highest education level: Not on file  Occupational History  . Occupation: retired  Tobacco Use  . Smoking status: Never Smoker  . Smokeless tobacco: Never Used  Substance and Sexual Activity  . Alcohol use: No  . Drug use: No  . Sexual activity: Never  Other Topics Concern  . Not on file  Social History Narrative   Right handed   One story home   Social Determinants of Health   Financial Resource Strain:   . Difficulty of Paying Living Expenses:   Food Insecurity:   . Worried About Charity fundraiser in the Last Year:   . Arboriculturist in the Last Year:   Transportation Needs:   . Film/video editor (Medical):   Marland Kitchen Lack of Transportation (Non-Medical):   Physical Activity:   . Days of Exercise per Week:   . Minutes of Exercise per Session:   Stress:   . Feeling of Stress :   Social Connections:   . Frequency of Communication with Friends and Family:   . Frequency of Social Gatherings with Friends and Family:   . Attends Religious Services:   . Active Member of Clubs or Organizations:   . Attends Archivist Meetings:   Marland Kitchen Marital Status:     Outpatient Encounter Medications as of 07/03/2019  Medication Sig  . acetaminophen (TYLENOL) 325 MG tablet Take 650 mg by mouth at bedtime.  Marland Kitchen acetaminophen (TYLENOL) 650 MG CR tablet Take 650 mg by mouth. every morning  . albuterol (PROAIR HFA) 108 (90 Base) MCG/ACT inhaler INHALE 1 PUFF INTO THE LUNGS EVERY 6 (SIX) HOURS AS NEEDED FOR WHEEZING OR SHORTNESS OF BREATH.  Marland Kitchen amoxicillin (AMOXIL) 500 MG tablet Take 4 by mouth 1 hour prior to dental procedure  . apixaban  (ELIQUIS) 5 MG TABS tablet Take 1 tablet (5 mg total) by mouth 2 (two) times daily.  . Ascorbic Acid (VITAMIN C) 100 MG tablet Take 100 mg by mouth daily.  . Biotin 5000 MCG CAPS Take 1 capsule by mouth daily.  . budesonide-formoterol (SYMBICORT) 80-4.5 MCG/ACT inhaler 2 puffs each am  . Calcium Citrate-Vitamin D (CITRACAL + D PO) Take 1 tablet by mouth 2 (two) times daily.   . Chlorpheniramine Maleate (EQ CHLORTABS PO) Take 1 tablet by mouth every 4 (four) hours as needed.  Marland Kitchen dextromethorphan-guaiFENesin (MUCINEX DM) 30-600 MG 12hr tablet Take 1 tablet by mouth 2 (two) times daily as needed.   Marland Kitchen  famotidine (PEPCID) 20 MG tablet One after supper  . fluticasone (FLONASE) 50 MCG/ACT nasal spray Place 1 spray into both nostrils 2 (two) times daily.  . hydrOXYzine (ATARAX/VISTARIL) 25 MG tablet Take one tablet by mouth daily as needed for itching.  . losartan (COZAAR) 50 MG tablet Take 1 tablet (50 mg total) by mouth daily.  . Magnesium Hydroxide (PHILLIPS MILK OF MAGNESIA PO) Take 15 mL by mouth. Take daily at 6 o'clock at night  . metoprolol tartrate (LOPRESSOR) 25 MG tablet TAKE 1 TABLET BY MOUTH TWICE A DAY  . mirabegron ER (MYRBETRIQ) 50 MG TB24 tablet Take 1 tablet (50 mg total) by mouth daily.  . Omega-3 Fatty Acids (FISH OIL PO) Take 1 capsule by mouth daily.  Marland Kitchen omeprazole (PRILOSEC) 20 MG capsule Take 20 mg by mouth daily.  . shark liver oil-cocoa butter (PREPARATION H) 0.25-3-85.5 % suppository Place 1 suppository rectally as needed.  . vitamin E 400 UNIT capsule Take 400 Units by mouth daily.   No facility-administered encounter medications on file as of 07/03/2019.    Activities of Daily Living In your present state of health, do you have any difficulty performing the following activities: 07/03/2019  Hearing? Y  Vision? N  Difficulty concentrating or making decisions? N  Walking or climbing stairs? N  Dressing or bathing? N  Doing errands, shopping? N  Preparing Food and eating ? N   Using the Toilet? N  In the past six months, have you accidently leaked urine? N  Do you have problems with loss of bowel control? Y  Managing your Medications? N  Managing your Finances? N  Housekeeping or managing your Housekeeping? N  Some recent data might be hidden    Patient Care Team: Lauree Chandler, NP as PCP - General (Geriatric Medicine) Druscilla Brownie, MD as Consulting Physician (Dermatology) [name] Irani, MD (Inactive) as Consulting Physician (Gastroenterology) Neldon Mc, MD as Consulting Physician (General Surgery) Lorretta Harp, MD as Consulting Physician (Cardiology) Bjorn Loser, MD as Consulting Physician (Urology) Lavell Anchors, MD as Consulting Physician (Ophthalmology) Pieter Partridge, DO as Consulting Physician (Neurology)    Assessment:   This is a routine wellness examination for [place].  Exercise Activities and Dietary recommendations Current Exercise Habits: Home exercise routine, Type of exercise: walking, Time (Minutes): 30, Frequency (Times/Week): 5, Weekly Exercise (Minutes/Week): 150  Goals    . Increase physical activity     Would like to walk every day when it is warm    . Increase water intake     Starting 02/04/16, I will attempt to increase my water from 3 bottles to 4-5 bottles per day.  Continues to want to work at this goal 5/21       Fall Risk Fall Risk  07/03/2019 06/20/2019 05/14/2019 05/03/2019 02/12/2019  Falls in the past year? 1 1 1  0 1  Number falls in past yr: 0 0 0 0 0  Injury with Fall? 0 0 0 0 1   Is the patient's home free of loose throw rugs in walkways, pet beds, electrical cords, etc?   yes      Grab bars in the bathroom? yes      Handrails on the stairs?   yes      Adequate lighting?   yes  Timed Get Up and Go performed: na  Depression Screen PHQ 2/9 Scores 07/03/2019 06/20/2019 05/31/2018 08/09/2017  PHQ - 2 Score 0 0 0 0     Cognitive Function MMSE -  Mini Mental State Exam 05/23/2017 02/04/2016  11/27/2014  Orientation to time 5 5 5   Orientation to Place 5 5 5   Registration 3 3 3   Attention/ Calculation 5 4 4   Recall 1 2 2   Language- name 2 objects 2 2 2   Language- repeat 1 1 1   Language- follow 3 step command 3 3 3   Language- read & follow direction 1 0 1  Write a sentence 1 0 1  Copy design 1 1 1   Total score 28 26 28      6CIT Screen 07/03/2019 05/31/2018  What Year? 0 points 0 points  What month? 0 points 0 points  What time? 0 points 0 points  Count back from 20 0 points 0 points  Months in reverse 0 points 0 points  Repeat phrase 2 points 4 points  Total Score 2 4    Immunization History  Administered Date(s) Administered  . Fluad Quad(high Dose 65+) 12/20/2018  . Influenza,inj,Quad PF,6+ Mos 11/29/2012, 11/27/2013, 11/27/2014, 12/09/2015, 01/11/2017, 12/20/2017  . Pneumococcal Conjugate-13 05/21/2014  . Pneumococcal Polysaccharide-23 01/11/2005, 08/15/2012  . Tdap 05/28/2014  . Zoster 01/14/2009  . Zoster Recombinat (Shingrix) 06/24/2017, 11/02/2017    Qualifies for Shingles Vaccine?done  Screening Tests Health Maintenance  Topic Date Due  . COVID-19 Vaccine (1) 07/06/2019 (Originally 04/18/1947)  . INFLUENZA VACCINE  09/30/2019  . TETANUS/TDAP  05/27/2024  . DEXA SCAN  Completed  . PNA vac Low Risk Adult  Completed    Cancer Screenings: Lung: Low Dose CT Chest recommended if Age 87-80 years, 30 pack-year currently smoking OR have quit w/in 15years. Patient does not qualify. Breast:  Up to date on Mammogram? Yes   Up to date of Bone Density/Dexa? Yes Colorectal: aged out  Additional Screenings: na Hepatitis C Screening:      Plan:      I have personally reviewed and noted the following in the patient's chart:   . Medical and social history . Use of alcohol, tobacco or illicit drugs  . Current medications and supplements . Functional ability and status . Nutritional status . Physical activity . Advanced directives . List of other  physicians . Hospitalizations, surgeries, and ER visits in previous 12 months . Vitals . Screenings to include cognitive, depression, and falls . Referrals and appointments  In addition, I have reviewed and discussed with patient certain preventive protocols, quality metrics, and best practice recommendations. A written personalized care plan for preventive services as well as general preventive health recommendations were provided to patient.     Lauree Chandler, NP  07/03/2019

## 2019-07-10 ENCOUNTER — Ambulatory Visit: Payer: Medicare Other | Admitting: Internal Medicine

## 2019-07-23 ENCOUNTER — Other Ambulatory Visit: Payer: Self-pay | Admitting: Cardiovascular Disease

## 2019-07-25 ENCOUNTER — Other Ambulatory Visit: Payer: Self-pay | Admitting: Cardiovascular Disease

## 2019-07-25 MED FILL — ELIQUIS 5 MG TABLET: 5 | 30 days supply | Qty: 60 | Fill #0

## 2019-08-02 MED FILL — LOSARTAN POTASSIUM 50 MG TA: 50 | 90 days supply | Qty: 90 | Fill #1

## 2019-08-02 MED FILL — MYRBETRIQ ER 50 MG TABLET: 50 | 30 days supply | Qty: 30 | Fill #3

## 2019-08-07 ENCOUNTER — Ambulatory Visit: Payer: Medicare Other | Admitting: Internal Medicine

## 2019-08-20 MED FILL — ELIQUIS 5 MG TABLET: 5 | 30 days supply | Qty: 60 | Fill #1

## 2019-08-20 MED FILL — METOPROLOL TARTRATE 25 MG T: 25 | 90 days supply | Qty: 180 | Fill #1

## 2019-08-21 ENCOUNTER — Other Ambulatory Visit: Payer: Self-pay

## 2019-08-21 ENCOUNTER — Ambulatory Visit (INDEPENDENT_AMBULATORY_CARE_PROVIDER_SITE_OTHER): Payer: Medicare Other | Admitting: Internal Medicine

## 2019-08-21 ENCOUNTER — Encounter: Payer: Self-pay | Admitting: Internal Medicine

## 2019-08-21 DIAGNOSIS — R05 Cough: Secondary | ICD-10-CM

## 2019-08-21 DIAGNOSIS — J45991 Cough variant asthma: Secondary | ICD-10-CM | POA: Diagnosis not present

## 2019-08-21 DIAGNOSIS — R058 Other specified cough: Secondary | ICD-10-CM

## 2019-08-21 NOTE — Progress Notes (Signed)
[name], female    DOB: Jul 10, 1931,     MRN: [id number]   Brief patient profile:  40 yowf never smoker  With onset doe house to mailbox x 2014 and no better p  AVR 2014 (though records suggest her breathing did improve) or spiriva, symbicort or saba assoc with noct cough so referred to pulmonary clinic 08/22/2018 by Sherrie Mustache   Was being followed by [facility] / Terie Purser Joya Gaskins since 1992 for "laryngel spasms" with botulinum injects     History of Present Illness  08/22/2018  Pulmonary/ 1st office eval/Mckenlee Mangham  Chief Complaint  Patient presents with  . Pulmonary Consult    Referred by Sherrie Mustache, NP. Pt c/o SOB since had heart surgery in 2014. She has occ cough when she lies down.   Dyspnea: slt uphill 50 ft and has to stop Cough: assoc with ex / candy helps but using peppermint  Sleep:p lie down x 5-10 min most nights starts coughing, helps to sit up  SABA use: once a day at most  Seeing Dr Joya Gaskins every 3-4 months with shots as above helps her voice and ? Her sob  for a month or two.  On  prilosec 20 mg daily 15 min ac  rec Prilosec (omeprazole)  20 mg x 2 x 30 min  Take 30- 60 min before your first and last meals of the day and take pepcid ac (otc)  20 mg after supper. If you are still having troubles with the nighttime cough > For drainage / throat tickle try take CHLORPHENIRAMINE  4 mg  (Chlortab 4mg   at [employer]  Stop spiriva / symbiocort and loratidine(clariton)  GERD diet   Please schedule a follow up office visit in 4 weeks, sooner if needed  with all medications /inhalers/ solutions in hand so we can verify exactly what you are taking. This includes all medications from all doctors and over the counters    09/26/2018  f/u ov/Neville Walston re: vcd/living at home - did not bring all meds  Chief Complaint  Patient presents with  . Follow-up    Breathing has improved some. Her cough is unchanged.   Dyspnea:  slt uphill to mb s stopping using symbicort when gets back  as oob at that point  Cough: better with candy  Sleeping: fine most nights  SABA use: not using  02: none  rec For cough /congestion> mucinex dm 1200 mg every 12 hours as needed  Ok to use symbicort but use it an hour before you go to [employer]   Work on inhaler technique:   01/22/2019  f/u ov/Joh Rao re: vcd/ did not bring meds  -  Using symbicort 80 prn not sure it helps   Chief Complaint  Patient presents with  . Follow-up    Breathing is "ok". She has had PFT's.   Dyspnea:  mb and back several times a week ? If symbicort helped  Cough: better  Sleeping: fine with bed blocks better than before  SABA use: none  02: none  rec Change symbicort to 80 (dulera 100)   2 pffs each am  Before daily dental care    08/21/2019  f/u ov/Shuntell Foody re: vcd/ AS not vaccinated  Chief Complaint  Patient presents with  . Follow-up    SOB associated with a dry cough.  Dyspnea:  Very predictable pattern where breathing gets much better for up to a couple months then worse and the last shot was  June 08 2019 Procedure performed: EMG-guided laryngeal Botulinum toxin injection Cough: occ dry cough/ swallowing ok Sleeping: flat fine  SABA use:  proair doesn't help 02: nothing    No obvious day to day or daytime variability or assoc excess/ purulent sputum or mucus plugs or hemoptysis or cp or chest tightness, subjective wheeze or overt sinus or hb symptoms.   sleeping without nocturnal  or early am exacerbation  of respiratory  c/o's or need for noct saba. Also denies any obvious fluctuation of symptoms with weather or environmental changes or other aggravating or alleviating factors except as outlined above   No unusual exposure hx or h/o childhood pna/ asthma or knowledge of premature birth.  Current Allergies, Complete Past Medical History, Past Surgical History, Family History, and Social History were reviewed in [employer] record.  ROS  The following are not active complaints  unless bolded Hoarseness, sore throat, dysphagia, dental problems, itching, sneezing,  nasal congestion or discharge of excess mucus or purulent secretions, ear ache,   fever, chills, sweats, unintended wt loss or wt gain, classically pleuritic or exertional cp,  orthopnea pnd or arm/hand swelling  or leg swelling, presyncope, palpitations, abdominal pain, anorexia, nausea, vomiting, diarrhea  or change in bowel habits or change in bladder habits, change in stools or change in urine, dysuria, hematuria,  rash, arthralgias, visual complaints, headache, numbness, weakness or ataxia or problems with walking or coordination,  change in mood or  memory.        Current Meds  Medication Sig  . acetaminophen (TYLENOL) 325 MG tablet Take 650 mg by mouth at bedtime.  Marland Kitchen acetaminophen (TYLENOL) 650 MG CR tablet Take 650 mg by mouth. every morning  . albuterol (PROAIR HFA) 108 (90 Base) MCG/ACT inhaler INHALE 1 PUFF INTO THE LUNGS EVERY 6 (SIX) HOURS AS NEEDED FOR WHEEZING OR SHORTNESS OF BREATH.  Marland Kitchen amoxicillin (AMOXIL) 500 MG tablet Take 4 by mouth 1 hour prior to dental procedure  . Ascorbic Acid (VITAMIN C) 100 MG tablet Take 100 mg by mouth daily.  . Biotin 5000 MCG CAPS Take 1 capsule by mouth daily.  . budesonide-formoterol (SYMBICORT) 80-4.5 MCG/ACT inhaler 2 puffs each am  . Calcium Citrate-Vitamin D (CITRACAL + D PO) Take 1 tablet by mouth 2 (two) times daily.   . Chlorpheniramine Maleate (EQ CHLORTABS PO) Take 1 tablet by mouth every 4 (four) hours as needed.  Marland Kitchen dextromethorphan-guaiFENesin (MUCINEX DM) 30-600 MG 12hr tablet Take 1 tablet by mouth 2 (two) times daily as needed.   Marland Kitchen ELIQUIS 5 MG TABS tablet TAKE 1 TABLET (5 MG TOTAL) BY MOUTH 2 (TWO) TIMES DAILY.  . famotidine (PEPCID) 20 MG tablet One after supper  . fluticasone (FLONASE) 50 MCG/ACT nasal spray Place 1 spray into both nostrils 2 (two) times daily.  . hydrOXYzine (ATARAX/VISTARIL) 25 MG tablet Take one tablet by mouth daily as needed  for itching.  . losartan (COZAAR) 50 MG tablet Take 1 tablet (50 mg total) by mouth daily.  . Magnesium Hydroxide (PHILLIPS MILK OF MAGNESIA PO) Take 15 mL by mouth. Take daily at 6 o'clock at night  . metoprolol tartrate (LOPRESSOR) 25 MG tablet TAKE 1 TABLET BY MOUTH TWICE A DAY  . mirabegron ER (MYRBETRIQ) 50 MG TB24 tablet Take 1 tablet (50 mg total) by mouth daily.  . Omega-3 Fatty Acids (FISH OIL PO) Take 1 capsule by mouth daily.  Marland Kitchen omeprazole (PRILOSEC) 20 MG capsule Take 20 mg by mouth daily.  . shark liver  oil-cocoa butter (PREPARATION H) 0.25-3-85.5 % suppository Place 1 suppository rectally as needed.  . vitamin E 400 UNIT capsule Take 400 Units by mouth daily.                          Objective:      08/21/2019       153  01/22/2019    149  09/26/18 148 lb (67.1 kg)  08/31/18 148 lb (67.1 kg)  08/22/18 153 lb 4.8 oz (69.5 kg)     Vital signs reviewed  08/21/2019  - Note at rest 02 sats  98% on RA      .extremely squeaky voiced amb wf nad    HEENT : pt wearing mask not removed for exam due to covid -19 concerns.    NECK :  without JVD/Nodes/TM/ nl carotid upstrokes bilaterally - mild pseudowheeze   LUNGS: no acc muscle use,  Nl contour chest which is clear to A and P bilaterally without cough on insp or exp maneuvers   CV:  RRR  no s3  II-III/VI sem s  increase in P2, and no edema   ABD:  soft and nontender with nl inspiratory excursion in the supine position. No bruits or organomegaly appreciated, bowel sounds nl  MS:  Nl gait/ ext warm without deformities, calf tenderness, cyanosis or clubbing No obvious joint restrictions   SKIN: warm and dry without lesions    NEURO:  alert, approp, nl sensorium with  no motor or cerebellar deficits apparent.              Assessment

## 2019-08-21 NOTE — Patient Instructions (Signed)
I strongly recommend you let Dr Joya Gaskins know what you told me today that your breathing gets better after your injection for up to several months  If there are any further issues about your pulmonary situation / condition I would favor you being seen by the pulmonary department at [facility].  I also strongly recommend you have the vaccine which has proven both safe and effective.   Pulmonary follow up here is as needed

## 2019-08-22 ENCOUNTER — Encounter: Payer: Self-pay | Admitting: Internal Medicine

## 2019-08-22 NOTE — Assessment & Plan Note (Addendum)
Onset ? 2014  - max rx for gerd / 1st gen H1 blockers per guidelines  08/22/2018 > some better  - Allergy profile 01/16/2019 >  Eos 0. /  IgE  60 RAST neg  - PFT's  01/22/2019  FEV1 1.18 (68 % ) ratio 0.71  p 13 % improvement from saba p 0 prior to study with DLCO  17.22 (94%) corrects to 4.79 (118%)  for alv volume and FV curve min curvature, no insp plateau  - 01/22/2019  After extensive coaching inhaler device,  effectiveness =   75% with symbicort rec 80 x 2 pffs daily followed by her routine dental/oral care - no better with low dose symbicort 08/21/2019 > d/c symbicort    Reviewed gerd rx/ use of hard rock candy to keep from voice clearing.   Also:  Pt informed of the seriousness of COVID 19 infection as a direct risk to lung health  and safey and to close contacts and should continue to wear a facemask in public and minimize exposure to public locations but especially avoid any area or activity where non-close contacts are not observing distancing or wearing an appropriate face mask.  I strongly recommended vaccine based on updated info I gave on millions of Americans treated now with proven safety and effectiveness even against  the delta variant.  Medical decision making was a moderate level of complexity in this case because of  two chronic conditions /diagnoses requiring extra time for  H and P, chart review, counseling, 30 min   and generating customized AVS unique to this office visit and charting.   Each maintenance medication was reviewed in detail including emphasizing most importantly the difference between maintenance and prns and under what circumstances the prns are to be triggered using an action plan format where appropriate. Please see avs for details which were reviewed in writing by both me and my nurse and patient given a written copy highlighted where appropriate with yellow highlighter for the patient's continued care at home along with an updated version of their medications.   Patient was asked to maintain medication reconciliation by comparing this list to the actual medications being used at home and to contact this office right away if there is a conflict or discrepancy.

## 2019-08-22 NOTE — Assessment & Plan Note (Signed)
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  If there are any further pulmonary concerns, advised to let Dr Joya Gaskins refer to [facility] pulmonary.

## 2019-08-23 ENCOUNTER — Other Ambulatory Visit: Payer: Self-pay | Admitting: Nurse Practitioner

## 2019-08-23 DIAGNOSIS — Z1231 Encounter for screening mammogram for malignant neoplasm of breast: Secondary | ICD-10-CM

## 2019-09-10 ENCOUNTER — Other Ambulatory Visit (HOSPITAL_COMMUNITY): Payer: Self-pay | Admitting: Urology

## 2019-09-18 MED FILL — ELIQUIS 5 MG TABLET: 5 | 30 days supply | Qty: 60 | Fill #2

## 2019-10-05 DIAGNOSIS — J385 Laryngeal spasm: Secondary | ICD-10-CM | POA: Diagnosis not present

## 2019-10-09 ENCOUNTER — Ambulatory Visit: Payer: Medicare Other | Admitting: Nurse Practitioner

## 2019-10-15 MED FILL — ELIQUIS 5 MG TABLET: 5 | 30 days supply | Qty: 60 | Fill #3

## 2019-10-30 ENCOUNTER — Encounter: Payer: Self-pay | Admitting: Cardiovascular Disease

## 2019-10-30 ENCOUNTER — Ambulatory Visit (INDEPENDENT_AMBULATORY_CARE_PROVIDER_SITE_OTHER): Payer: Medicare Other | Admitting: Cardiovascular Disease

## 2019-10-30 ENCOUNTER — Other Ambulatory Visit: Payer: Self-pay

## 2019-10-30 VITALS — BP 176/80 | HR 77 | Ht 63.0 in | Wt 149.6 lb

## 2019-10-30 DIAGNOSIS — I350 Nonrheumatic aortic (valve) stenosis: Secondary | ICD-10-CM

## 2019-10-30 DIAGNOSIS — I10 Essential (primary) hypertension: Secondary | ICD-10-CM

## 2019-10-30 DIAGNOSIS — Z952 Presence of prosthetic heart valve: Secondary | ICD-10-CM

## 2019-10-30 DIAGNOSIS — I4811 Longstanding persistent atrial fibrillation: Secondary | ICD-10-CM

## 2019-10-30 MED ORDER — LOSARTAN POTASSIUM 100 MG PO TABS: 100.0000 mg | ORAL_TABLET | Freq: Every day | ORAL | 1 refills | Status: DC

## 2019-10-30 MED FILL — LOSARTAN POTASSIUM 100 MG T: 100 | 90 days supply | Qty: 90 | Fill #0

## 2019-10-30 NOTE — Assessment & Plan Note (Signed)
History of essential hypertension a blood pressure measured today at 176/80.  She is on losartan 50 mg a day and metoprolol.  She says at home her blood pressures variable but sometimes reaches these levels as well.  I am going to have her keep a 30-day blood pressure log and have her come back in 4 weeks to see a Pharm.D. to review make appropriate changes.

## 2019-10-30 NOTE — Patient Instructions (Addendum)
Medication Instructions:  The current medical regimen is effective;  continue present plan and medications.  *If you need a refill on your cardiac medications before your next appointment, please call your pharmacy*   Testing/Procedures: Echocardiogram - Your physician has requested that you have an echocardiogram. Echocardiography is a painless test that uses sound waves to create images of your heart. It provides your doctor with information about the size and shape of your heart and how well your heart's chambers and valves are working. This procedure takes approximately one hour. There are no restrictions for this procedure. This will be performed at our [facility] location - [address].    Follow-Up: At [facility], you and your health needs are our priority.  As part of our continuing mission to provide you with exceptional heart care, we have created designated Provider Care Teams.  These Care Teams include your primary Cardiologist (physician) and Advanced Practice Providers (APPs -  Physician Assistants and Nurse Practitioners) who all work together to provide you with the care you need, when you need it.  We recommend signing up for the patient portal called "MyChart".  Sign up information is provided on this After Visit Summary.  MyChart is used to connect with patients for Virtual Visits (Telemedicine).  Patients are able to view lab/test results, encounter notes, upcoming appointments, etc.  Non-urgent messages can be sent to your provider as well.   To learn more about what you can do with MyChart, go to [email or web address].    Your next appointment:   12 month(s)  The format for your next appointment:   In Person  Provider:   Quay Burow, MD   Other Instructions Keep 30 day BP log, and see PHARMD in 4 weeks.

## 2019-10-30 NOTE — Assessment & Plan Note (Signed)
History of critical aortic stenosis status post bioprosthetic aortic valve replacement by Dr. Roxy Manns 08/30/2012 with an Edwards magna ease pericardial tissue valve (21 mm) with excellent result.  Her most recent 2D echocardiogram performed 11/13/2018 revealed normal LV systolic function with a well-functioning aortic bioprosthesis and severe TR.  We will repeat a 2D echocardiogram.

## 2019-10-30 NOTE — Progress Notes (Signed)
10/30/2019 [name]   Apr 22, 1931  [id number]  Primary Physician Lauree Chandler, NP Primary Cardiologist: Lorretta Harp MD Garret Reddish, [place], [place]  HPI:  [name] is a 86 y.o.  mildly overweight married Caucasian female who I last saw  10/18/2018. She is accompanied by her daughter [name].Sheis a mother of 2, grandmother to 1 grandchild. Her risk factors include hypertension and family history. A brother died at age 21 from heart-related issues while he was being operated on. She has never had a heart attack or stroke. She does have moderate aortic stenosis by 2D echocardiogram last performed a year ago with a valve area of 0.83 cm2, peak gradient of 57, and mean of 35. She had negative Myoview on August 13, 2010. Since I saw her last, she developed exertional jaw pain, which was fairly reproducible. Her last lipid profile a year ago was excellent with total cholesterol 166, LDL 89, HDL 44. 2-D echo was performed showed critical aortic stenosis the valve area 0.5 cm. Based on thisthe patient underwent right left heart cardiac catheterization by myself revealing normal coronaries and normal left function. She had critical aortic stenosis and ultimately underwent bioprosthetic aortic valve replacement by Dr. Roxy Manns on 08/30/12 with an Edwards magna ease pericardial tissue valve (21 mm) excellent result. Her postop course was complicated by nausea and paroxysmal atrial fibrillation for which she was treated with low dose amiodarone and Coumadin anticoagulation.  These drugs were ultimately discontinued.  Since I saw her a year ago she remains cardiovascular stable.  She continues to have dyspnea probably related to her COPD on inhaled bronchodilators followed by Dr. Melvyn Novas.  Her last 2D echo performed 11/13/2018 revealed normal LV systolic function with a well-functioning aortic bioprosthesis and severe TR.  She remains in A. fib rate controlled on Eliquis oral  anticoagulation.  Current Meds  Medication Sig  . acetaminophen (TYLENOL) 325 MG tablet Take 650 mg by mouth at bedtime.  Marland Kitchen acetaminophen (TYLENOL) 650 MG CR tablet Take 650 mg by mouth. every morning  . albuterol (PROAIR HFA) 108 (90 Base) MCG/ACT inhaler INHALE 1 PUFF INTO THE LUNGS EVERY 6 (SIX) HOURS AS NEEDED FOR WHEEZING OR SHORTNESS OF BREATH.  Marland Kitchen amoxicillin (AMOXIL) 500 MG tablet Take 4 by mouth 1 hour prior to dental procedure  . Ascorbic Acid (VITAMIN C) 100 MG tablet Take 100 mg by mouth daily.  . Biotin 5000 MCG CAPS Take 1 capsule by mouth daily.  . Calcium Citrate-Vitamin D (CITRACAL + D PO) Take 1 tablet by mouth 2 (two) times daily.   . Chlorpheniramine Maleate (EQ CHLORTABS PO) Take 1 tablet by mouth every 4 (four) hours as needed.  Marland Kitchen dextromethorphan-guaiFENesin (MUCINEX DM) 30-600 MG 12hr tablet Take 1 tablet by mouth 2 (two) times daily as needed.   Marland Kitchen ELIQUIS 5 MG TABS tablet TAKE 1 TABLET (5 MG TOTAL) BY MOUTH 2 (TWO) TIMES DAILY.  . famotidine (PEPCID) 20 MG tablet One after supper  . fluticasone (FLONASE) 50 MCG/ACT nasal spray Place 1 spray into both nostrils 2 (two) times daily.  . hydrOXYzine (ATARAX/VISTARIL) 25 MG tablet Take one tablet by mouth daily as needed for itching.  . losartan (COZAAR) 50 MG tablet Take 1 tablet (50 mg total) by mouth daily.  . Magnesium Hydroxide (PHILLIPS MILK OF MAGNESIA PO) Take 15 mL by mouth. Take daily at 6 o'clock at night  . metoprolol tartrate (LOPRESSOR) 25 MG tablet TAKE 1 TABLET BY MOUTH TWICE A  DAY  . mirabegron ER (MYRBETRIQ) 50 MG TB24 tablet Take 1 tablet (50 mg total) by mouth daily.  . Omega-3 Fatty Acids (FISH OIL PO) Take 1 capsule by mouth daily.  Marland Kitchen omeprazole (PRILOSEC) 20 MG capsule Take 20 mg by mouth daily.  . shark liver oil-cocoa butter (PREPARATION H) 0.25-3-85.5 % suppository Place 1 suppository rectally as needed.  . vitamin E 400 UNIT capsule Take 400 Units by mouth daily.     Allergies  Allergen  Reactions  . Lisinopril Cough  . Codeine Rash    All over the body.    Social History   Socioeconomic History  . Marital status: Widowed    Spouse name: Not on file  . Number of children: 2  . Years of education: 9  . Highest education level: Not on file  Occupational History  . Occupation: retired  Tobacco Use  . Smoking status: Never Smoker  . Smokeless tobacco: Never Used  Vaping Use  . Vaping Use: Never used  Substance and Sexual Activity  . Alcohol use: No  . Drug use: No  . Sexual activity: Never  Other Topics Concern  . Not on file  Social History Narrative   Right handed   One story home   Social Determinants of Health   Financial Resource Strain:   . Difficulty of Paying Living Expenses: Not on file  Food Insecurity:   . Worried About Charity fundraiser in the Last Year: Not on file  . Ran Out of Food in the Last Year: Not on file  Transportation Needs:   . Lack of Transportation (Medical): Not on file  . Lack of Transportation (Non-Medical): Not on file  Physical Activity:   . Days of Exercise per Week: Not on file  . Minutes of Exercise per Session: Not on file  Stress:   . Feeling of Stress : Not on file  Social Connections:   . Frequency of Communication with Friends and Family: Not on file  . Frequency of Social Gatherings with Friends and Family: Not on file  . Attends Religious Services: Not on file  . Active Member of Clubs or Organizations: Not on file  . Attends Archivist Meetings: Not on file  . Marital Status: Not on file  Intimate Partner Violence:   . Fear of Current or Ex-Partner: Not on file  . Emotionally Abused: Not on file  . Physically Abused: Not on file  . Sexually Abused: Not on file     Review of Systems: General: negative for chills, fever, night sweats or weight changes.  Cardiovascular: negative for chest pain, dyspnea on exertion, edema, orthopnea, palpitations, paroxysmal nocturnal dyspnea or shortness of  breath Dermatological: negative for rash Respiratory: negative for cough or wheezing Urologic: negative for hematuria Abdominal: negative for nausea, vomiting, diarrhea, bright red blood per rectum, melena, or hematemesis Neurologic: negative for visual changes, syncope, or dizziness All other systems reviewed and are otherwise negative except as noted above.    Blood pressure (!) 176/80, pulse 77, height 5\' 3"  (1.6 m), weight 149 lb 9.6 oz (67.9 kg), SpO2 98 %.  General appearance: alert and no distress Neck: no adenopathy, no JVD, supple, symmetrical, trachea midline, thyroid not enlarged, symmetric, no tenderness/mass/nodules and Soft left carotid bruit Lungs: clear to auscultation bilaterally Heart: 2/6 outflow tract murmur consistent with aortic stenosis Extremities: extremities normal, atraumatic, no cyanosis or edema Pulses: 2+ and symmetric Skin: Skin color, texture, turgor normal. No rashes or lesions  Neurologic: Alert and oriented X 3, normal strength and tone. Normal symmetric reflexes. Normal coordination and gait  EKG atrial fibrillation with a ventricular sponsor 77 and septal Q waves.  I personally reviewed this EKG.  ASSESSMENT AND PLAN:   Essential hypertension History of essential hypertension a blood pressure measured today at 176/80.  She is on losartan 50 mg a day and metoprolol.  She says at home her blood pressures variable but sometimes reaches these levels as well.  I am going to have her keep a 30-day blood pressure log and have her come back in 4 weeks to see a Pharm.D. to review make appropriate changes.  Aortic stenosis- critical AS at cath 08/14/12, with surgery History of critical aortic stenosis status post bioprosthetic aortic valve replacement by Dr. Roxy Manns 08/30/2012 with an Edwards magna ease pericardial tissue valve (21 mm) with excellent result.  Her most recent 2D echocardiogram performed 11/13/2018 revealed normal LV systolic function with a  well-functioning aortic bioprosthesis and severe TR.  We will repeat a 2D echocardiogram.  Atrial fibrillation ([place]) History of persistent A. fib rate controlled on Eliquis oral anticoagulation.      Lorretta Harp MD FACP,FACC,FAHA, [facility] 10/30/2019 4:07 PM

## 2019-10-30 NOTE — Assessment & Plan Note (Signed)
History of persistent A-fib rate controlled on Eliquis oral anticoagulation. 

## 2019-10-31 ENCOUNTER — Telehealth: Payer: Self-pay | Admitting: Cardiovascular Disease

## 2019-10-31 NOTE — Telephone Encounter (Signed)
Pt med list has been updated.

## 2019-10-31 NOTE — Telephone Encounter (Signed)
Patient's daughter states the patient is no longer taking Biotin 5000 MCG CAPS and she is now taking Collagen 1000 MG daily. She is requesting to have her medication list updated.

## 2019-11-07 ENCOUNTER — Ambulatory Visit
Admission: RE | Admit: 2019-11-07 | Discharge: 2019-11-07 | Disposition: A | Discharge status: Home / Self Care | Payer: Medicare Other | Source: Ambulatory Visit | Attending: Nurse Practitioner | Admitting: Nurse Practitioner

## 2019-11-07 ENCOUNTER — Other Ambulatory Visit: Payer: Self-pay

## 2019-11-07 DIAGNOSIS — Z1231 Encounter for screening mammogram for malignant neoplasm of breast: Secondary | ICD-10-CM | POA: Diagnosis not present

## 2019-11-12 ENCOUNTER — Other Ambulatory Visit: Payer: Self-pay

## 2019-11-12 ENCOUNTER — Ambulatory Visit (INDEPENDENT_AMBULATORY_CARE_PROVIDER_SITE_OTHER): Payer: Medicare Other | Admitting: Nurse Practitioner

## 2019-11-12 ENCOUNTER — Encounter: Payer: Self-pay | Admitting: Nurse Practitioner

## 2019-11-12 VITALS — BP 170/92 | HR 61 | Temp 97.1°F | Ht 63.0 in | Wt 148.0 lb

## 2019-11-12 DIAGNOSIS — J449 Chronic obstructive pulmonary disease, unspecified: Secondary | ICD-10-CM | POA: Diagnosis not present

## 2019-11-12 DIAGNOSIS — N183 Chronic kidney disease, stage 3 unspecified: Secondary | ICD-10-CM

## 2019-11-12 DIAGNOSIS — I10 Essential (primary) hypertension: Secondary | ICD-10-CM

## 2019-11-12 DIAGNOSIS — I480 Paroxysmal atrial fibrillation: Secondary | ICD-10-CM

## 2019-11-12 DIAGNOSIS — N3281 Overactive bladder: Secondary | ICD-10-CM | POA: Diagnosis not present

## 2019-11-12 DIAGNOSIS — Z23 Encounter for immunization: Secondary | ICD-10-CM

## 2019-11-12 NOTE — Progress Notes (Signed)
Careteam: Patient Care Team: Lauree Chandler, NP as PCP - General (Geriatric Medicine) Druscilla Brownie, MD as Consulting Physician (Dermatology) [name] Irani, MD (Inactive) as Consulting Physician (Gastroenterology) Neldon Mc, MD as Consulting Physician (General Surgery) Lorretta Harp, MD as Consulting Physician (Cardiology) Bjorn Loser, MD as Consulting Physician (Urology) Lavell Anchors, MD as Consulting Physician (Ophthalmology) Pieter Partridge, DO as Consulting Physician (Neurology)  PLACE OF SERVICE:  [facility]  Advanced Directive information    Allergies  Allergen Reactions  . Lisinopril Cough  . Codeine Rash    All over the body.    Chief Complaint  Patient presents with  . Acute Visit    Blood pressure concerns, elevated      HPI: Patient is a 86 y.o. female for follow up.   Could not afford myrbetriq due to increase out of pocket expensed.   Hypertension- blood pressure has been elevated, went to cardiology and increased losartan 100 mg daily. She has a follow up appt with pharmD at the cardiology office to make changes if needed. Her blood pressure has improved at home ranging from 132-156/77-83 Pt reports blood pressure 132/69  Cough- continues to follow up with pulmonary, reports cough stable. No worsening of symptoms.   Headache- improved   A fib- continues on eliquis 5 mg BID for anticoagulation, no abnormal bruising or bleeding. Metoprolol 25 mg BID for rate control  GERD- controlled on famotidine.   Review of Systems:  Review of Systems  Constitutional: Negative for chills, fever and weight loss.  HENT: Negative for tinnitus.   Respiratory: Positive for cough. Negative for sputum production and shortness of breath.   Cardiovascular: Negative for chest pain, palpitations and leg swelling.  Gastrointestinal: Negative for abdominal pain, constipation, diarrhea and heartburn.  Genitourinary: Positive for frequency. Negative  for dysuria and urgency.  Musculoskeletal: Negative for back pain, falls, joint pain and myalgias.  Skin: Negative.   Neurological: Negative for dizziness and headaches.  Psychiatric/Behavioral: Negative for depression and memory loss. The patient does not have insomnia.     Past Medical History:  Diagnosis Date  . Aortic stenosis 07/20/2012   Aortic stenosis   . Aortic valve disorder 08/13/2011   ECHO - EF >16%; mild diastolic dysfunction; calcified aortic valve, not well visualized; mod/severe aortic stenosis w/ worsening gradients when compared to 2012  . Arthritis   . Cancer ([place])    Breast  . Carotid bruit 08/06/2008   Doppler - R ECA demonstrates noarrowing w/ elevated velocities consistent w/ >70% diameter reduction; R and L ICAs show no evidence of diameter reduction, significant tortuosity or vascular abnormality;   . Change in voice   . COPD (chronic obstructive pulmonary disease) ([place])   . Hearing loss   . Heart murmur   . Hyperlipidemia   . Hypertension    dr Gwenlyn Found  . Osteoporosis   . PAF (paroxysmal atrial fibrillation) ([place]) 09/04/2012  . PVD (peripheral vascular disease) ([place]) 08/13/2010   R/P MV - normal pattern of perfusion in all regions, EF 76%; no significant wall abnormalities noted; normal perfusion study  . Right carotid bruit    high-grade right external carotid artery stenosis by 2 parts ultrasound 2 years ago  . S/P aortic valve replacement with bioprosthetic valve 08/30/2012   21 mm [facility] Ease bovine pericardial tissue valve   Past Surgical History:  Procedure Laterality Date  . ABDOMINAL HYSTERECTOMY     Partial  . AORTIC VALVE REPLACEMENT N/A 08/30/2012  Procedure: AORTIC VALVE REPLACEMENT (AVR);  Surgeon: Rexene Alberts, MD;  Location: [facility];  Service: Open Heart Surgery;  Laterality: N/A;  . BIOPSY SHOULDER Left 10/08/2005   shave biopsy  . BREAST LUMPECTOMY  02/25/1997   right  . CARDIAC CATHETERIZATION    . [place]  . COSMETIC  SURGERY Left 1997   Breast implant  . EYE SURGERY  1997   Cataract surgery  . INTRAOPERATIVE TRANSESOPHAGEAL ECHOCARDIOGRAM N/A 08/30/2012   Procedure: INTRAOPERATIVE TRANSESOPHAGEAL ECHOCARDIOGRAM;  Surgeon: Rexene Alberts, MD;  Location: [facility];  Service: Open Heart Surgery;  Laterality: N/A;  . LEFT AND RIGHT HEART CATHETERIZATION WITH CORONARY ANGIOGRAM N/A 08/14/2012   Procedure: LEFT AND RIGHT HEART CATHETERIZATION WITH CORONARY ANGIOGRAM;  Surgeon: Lorretta Harp, MD;  Location: [facility] CATH LAB;  Service: Cardiovascular;  Laterality: N/A;  . LEFT HEART CATH  08/14/12   Nl cors, AS  . MASTECTOMY  09/29/95   left  . PARTIAL HYSTERECTOMY    . TOTAL HIP ARTHROPLASTY  09/2010   Social History:   reports that she has never smoked. She has never used smokeless tobacco. She reports that she does not drink alcohol and does not use drugs.  Family History  Problem Relation Age of Onset  . Cancer Mother        Bladder  . Early death Father        Tractor accident  . Early death Brother        Radiation protection practitioner  . Early death Brother        during heart surgery    Medications: Patient's Medications  New Prescriptions   No medications on file  Previous Medications   ACETAMINOPHEN (TYLENOL) 325 MG TABLET    Take 650 mg by mouth at bedtime as needed.    ACETAMINOPHEN (TYLENOL) 650 MG CR TABLET    Take 650 mg by mouth. every morning   ALBUTEROL (PROAIR HFA) 108 (90 BASE) MCG/ACT INHALER    INHALE 1 PUFF INTO THE LUNGS EVERY 6 (SIX) HOURS AS NEEDED FOR WHEEZING OR SHORTNESS OF BREATH.   AMOXICILLIN (AMOXIL) 500 MG TABLET    Take 4 by mouth 1 hour prior to dental procedure   ASCORBIC ACID (VITAMIN C) 100 MG TABLET    Take 100 mg by mouth daily.   CALCIUM CITRATE-VITAMIN D (CITRACAL + D PO)    Take 1 tablet by mouth 2 (two) times daily.    CHLORPHENIRAMINE MALEATE (EQ CHLORTABS PO)    Take 1 tablet by mouth every 4 (four) hours as needed.   DEXTROMETHORPHAN-GUAIFENESIN (MUCINEX DM) 30-600 MG 12HR  TABLET    Take 1 tablet by mouth 2 (two) times daily as needed.    ELIQUIS 5 MG TABS TABLET    TAKE 1 TABLET (5 MG TOTAL) BY MOUTH 2 (TWO) TIMES DAILY.   FAMOTIDINE (PEPCID) 20 MG TABLET    One after supper   FLUTICASONE (FLONASE) 50 MCG/ACT NASAL SPRAY    Place 1 spray into both nostrils 2 (two) times daily as needed for allergies or rhinitis.   HYDROXYZINE (ATARAX/VISTARIL) 25 MG TABLET    Take one tablet by mouth daily as needed for itching.   LOSARTAN (COZAAR) 100 MG TABLET    Take 1 tablet (100 mg total) by mouth daily.   MAGNESIUM HYDROXIDE (PHILLIPS MILK OF MAGNESIA PO)    Take 15 mL by mouth. Take daily at 6 o'clock at night   METOPROLOL TARTRATE (LOPRESSOR) 25 MG TABLET  TAKE 1 TABLET BY MOUTH TWICE A DAY   MIRABEGRON ER (MYRBETRIQ) 50 MG TB24 TABLET    Take 1 tablet (50 mg total) by mouth daily.   OMEGA-3 FATTY ACIDS (FISH OIL PO)    Take 1 capsule by mouth daily.   OMEPRAZOLE (PRILOSEC) 20 MG CAPSULE    Take 20 mg by mouth daily.   SHARK LIVER OIL-COCOA BUTTER (PREPARATION H) 0.25-3-85.5 % SUPPOSITORY    Place 1 suppository rectally as needed.   SPECIALTY VITAMINS PRODUCTS (ADVANCED COLLAGEN PO)    Take 1,000 mg by mouth daily.   VITAMIN E 400 UNIT CAPSULE    Take 400 Units by mouth daily.  Modified Medications   No medications on file  Discontinued Medications   FLUTICASONE (FLONASE) 50 MCG/ACT NASAL SPRAY    Place 1 spray into both nostrils 2 (two) times daily.    Physical Exam:  Vitals:   11/12/19 1520 11/12/19 1524  BP: (!) 178/90 (!) 170/92  Pulse: 61   Temp: (!) 97.1 F (36.2 C)   TempSrc: Temporal   SpO2: 97%   Weight: 148 lb (67.1 kg)   Height: 5\' 3"  (1.6 m)    Body mass index is 26.22 kg/m. Wt Readings from Last 3 Encounters:  11/12/19 148 lb (67.1 kg)  10/30/19 149 lb 9.6 oz (67.9 kg)  08/21/19 153 lb 12.8 oz (69.8 kg)    Physical Exam Constitutional:      General: She is not in acute distress.    Appearance: She is well-developed. She is not  diaphoretic.  HENT:     Head: Normocephalic and atraumatic.     Mouth/Throat:     Pharynx: No oropharyngeal exudate.  Eyes:     Conjunctiva/sclera: Conjunctivae normal.     Pupils: Pupils are equal, round, and reactive to light.  Cardiovascular:     Rate and Rhythm: Normal rate and regular rhythm.     Heart sounds: Normal heart sounds.  Pulmonary:     Effort: Pulmonary effort is normal.     Breath sounds: Normal breath sounds.  Abdominal:     General: Bowel sounds are normal.     Palpations: Abdomen is soft.  Musculoskeletal:        General: No tenderness.     Cervical back: Normal range of motion and neck supple.  Skin:    General: Skin is warm and dry.  Neurological:     Mental Status: She is alert and oriented to person, place, and time.      Labs reviewed: Basic Metabolic Panel: Recent Labs    12/20/18 1605 06/18/19 0813  NA 137 140  K 4.6 4.1  CL 102 105  CO2 28 29  GLUCOSE 94 96  BUN 16 20  CREATININE 1.23* 1.01*  CALCIUM 10.2 9.9   Liver Function Tests: Recent Labs    12/20/18 1605 06/18/19 0813  AST 21 18  ALT 14 14  BILITOT 0.6 0.6  PROT 5.8* 6.0*   No results for input(s): LIPASE, AMYLASE in the last 8760 hours. No results for input(s): AMMONIA in the last 8760 hours. CBC: Recent Labs    12/20/18 1605 06/18/19 0813  WBC 7.2 5.3  NEUTROABS 4,558 2,639  HGB 12.3 12.5  HCT 36.3 36.9  MCV 97.1 98.1  PLT 159 159   Lipid Panel: Recent Labs    06/18/19 0813  CHOL 147  HDL 58  LDLCALC 75  TRIG 60  CHOLHDL 2.5   TSH: No results for input(s): TSH  in the last 8760 hours. A1C: Lab Results  Component Value Date   HGBA1C 5.5 08/28/2012     Assessment/Plan 1. Need for influenza vaccination - Flu Vaccine QUAD High Dose(Fluad)  2. Essential hypertension Remains elevated on losartan 100 mg daily with metoprolol 25 mg BID at the visit today however home BP have improved. Plans to discuss further with pharmD in 2 weeks. Low sodium diet  encouraged  - BASIC METABOLIC PANEL WITH GFR - CBC with Differential/Platelet  3. Overactive bladder Stable however she is not taking her mirabetriq due to cost.  4. Stage 3 chronic kidney disease, unspecified whether stage 3a or 3b CKD -Encourage proper hydration and to avoid NSAIDS (Aleve, Advil, Motrin, Ibuprofen)   5. PAF (paroxysmal atrial fibrillation) ([facility]) Rate controlled, continues on metoprolol 25 mg BID and eliquis for anticoagulation.  - CBC with Differential/Platelet  6. Chronic obstructive pulmonary disease, unspecified COPD type ([place]) -stable continues to follow up with pulmonary no increase in cough or congestion.  Next appt: 4 months. Carlos American. [place], [facility] [phone]

## 2019-11-12 NOTE — Patient Instructions (Signed)
DASH Eating Plan DASH stands for "Dietary Approaches to Stop Hypertension." The DASH eating plan is a healthy eating plan that has been shown to reduce high blood pressure (hypertension). It may also reduce your risk for type 2 diabetes, heart disease, and stroke. The DASH eating plan may also help with weight loss. What are tips for following this plan?  General guidelines  Avoid eating more than 2,300 mg (milligrams) of salt (sodium) a day. If you have hypertension, you may need to reduce your sodium intake to 1,500 mg a day.  Limit alcohol intake to no more than 1 drink a day for nonpregnant women and 2 drinks a day for men. One drink equals 12 oz of beer, 5 oz of wine, or 1 oz of hard liquor.  Work with your health care provider to maintain a healthy body weight or to lose weight. Ask what an ideal weight is for you.  Get at least 30 minutes of exercise that causes your heart to beat faster (aerobic exercise) most days of the week. Activities may include walking, swimming, or biking.  Work with your health care provider or diet and nutrition specialist (dietitian) to adjust your eating plan to your individual calorie needs. Reading food labels   Check food labels for the amount of sodium per serving. Choose foods with less than 5 percent of the Daily Value of sodium. Generally, foods with less than 300 mg of sodium per serving fit into this eating plan.  To find whole grains, look for the word "whole" as the first word in the ingredient list. Shopping  Buy products labeled as "low-sodium" or "no salt added."  Buy fresh foods. Avoid canned foods and premade or frozen meals. Cooking  Avoid adding salt when cooking. Use salt-free seasonings or herbs instead of table salt or sea salt. Check with your health care provider or pharmacist before using salt substitutes.  Do not fry foods. Cook foods using healthy methods such as baking, boiling, grilling, and broiling instead.  Cook with  heart-healthy oils, such as olive, canola, soybean, or sunflower oil. Meal planning  Eat a balanced diet that includes: ? 5 or more servings of fruits and vegetables each day. At each meal, try to fill half of your plate with fruits and vegetables. ? Up to 6-8 servings of whole grains each day. ? Less than 6 oz of lean meat, poultry, or fish each day. A 3-oz serving of meat is about the same size as a deck of cards. One egg equals 1 oz. ? 2 servings of low-fat dairy each day. ? A serving of nuts, seeds, or beans 5 times each week. ? Heart-healthy fats. Healthy fats called Omega-3 fatty acids are found in foods such as flaxseeds and coldwater fish, like sardines, salmon, and mackerel.  Limit how much you eat of the following: ? Canned or prepackaged foods. ? Food that is high in trans fat, such as fried foods. ? Food that is high in saturated fat, such as fatty meat. ? Sweets, desserts, sugary drinks, and other foods with added sugar. ? Full-fat dairy products.  Do not salt foods before eating.  Try to eat at least 2 vegetarian meals each week.  Eat more home-cooked food and less restaurant, buffet, and fast food.  When eating at a restaurant, ask that your food be prepared with less salt or no salt, if possible. What foods are recommended? The items listed may not be a complete list. Talk with your dietitian about   what dietary choices are best for you. Grains Whole-grain or whole-wheat bread. Whole-grain or whole-wheat pasta. Brown rice. Oatmeal. Quinoa. Bulgur. Whole-grain and low-sodium cereals. Pita bread. Low-fat, low-sodium crackers. Whole-wheat flour tortillas. Vegetables Fresh or frozen vegetables (raw, steamed, roasted, or grilled). Low-sodium or reduced-sodium tomato and vegetable juice. Low-sodium or reduced-sodium tomato sauce and tomato paste. Low-sodium or reduced-sodium canned vegetables. Fruits All fresh, dried, or frozen fruit. Canned fruit in natural juice (without  added sugar). Meat and other protein foods Skinless chicken or turkey. Ground chicken or turkey. Pork with fat trimmed off. Fish and seafood. Egg whites. Dried beans, peas, or lentils. Unsalted nuts, nut butters, and seeds. Unsalted canned beans. Lean cuts of beef with fat trimmed off. Low-sodium, lean deli meat. Dairy Low-fat (1%) or fat-free (skim) milk. Fat-free, low-fat, or reduced-fat cheeses. Nonfat, low-sodium ricotta or cottage cheese. Low-fat or nonfat yogurt. Low-fat, low-sodium cheese. Fats and oils Soft margarine without trans fats. Vegetable oil. Low-fat, reduced-fat, or light mayonnaise and salad dressings (reduced-sodium). Canola, safflower, olive, soybean, and sunflower oils. Avocado. Seasoning and other foods Herbs. Spices. Seasoning mixes without salt. Unsalted popcorn and pretzels. Fat-free sweets. What foods are not recommended? The items listed may not be a complete list. Talk with your dietitian about what dietary choices are best for you. Grains Baked goods made with fat, such as croissants, muffins, or some breads. Dry pasta or rice meal packs. Vegetables Creamed or fried vegetables. Vegetables in a cheese sauce. Regular canned vegetables (not low-sodium or reduced-sodium). Regular canned tomato sauce and paste (not low-sodium or reduced-sodium). Regular tomato and vegetable juice (not low-sodium or reduced-sodium). Pickles. Olives. Fruits Canned fruit in a light or heavy syrup. Fried fruit. Fruit in cream or butter sauce. Meat and other protein foods Fatty cuts of meat. Ribs. Fried meat. Bacon. Sausage. Bologna and other processed lunch meats. Salami. Fatback. Hotdogs. Bratwurst. Salted nuts and seeds. Canned beans with added salt. Canned or smoked fish. Whole eggs or egg yolks. Chicken or turkey with skin. Dairy Whole or 2% milk, cream, and half-and-half. Whole or full-fat cream cheese. Whole-fat or sweetened yogurt. Full-fat cheese. Nondairy creamers. Whipped toppings.  Processed cheese and cheese spreads. Fats and oils Butter. Stick margarine. Lard. Shortening. Ghee. Bacon fat. Tropical oils, such as coconut, palm kernel, or palm oil. Seasoning and other foods Salted popcorn and pretzels. Onion salt, garlic salt, seasoned salt, table salt, and sea salt. Worcestershire sauce. Tartar sauce. Barbecue sauce. Teriyaki sauce. Soy sauce, including reduced-sodium. Steak sauce. Canned and packaged gravies. Fish sauce. Oyster sauce. Cocktail sauce. Horseradish that you find on the shelf. Ketchup. Mustard. Meat flavorings and tenderizers. Bouillon cubes. Hot sauce and Tabasco sauce. Premade or packaged marinades. Premade or packaged taco seasonings. Relishes. Regular salad dressings. Where to find more information:  National Heart, Lung, and Blood Institute: www.nhlbi.nih.gov  American Heart Association: www.heart.org Summary  The DASH eating plan is a healthy eating plan that has been shown to reduce high blood pressure (hypertension). It may also reduce your risk for type 2 diabetes, heart disease, and stroke.  With the DASH eating plan, you should limit salt (sodium) intake to 2,300 mg a day. If you have hypertension, you may need to reduce your sodium intake to 1,500 mg a day.  When on the DASH eating plan, aim to eat more fresh fruits and vegetables, whole grains, lean proteins, low-fat dairy, and heart-healthy fats.  Work with your health care provider or diet and nutrition specialist (dietitian) to adjust your eating plan to your   individual calorie needs. This information is not intended to replace advice given to you by your health care provider. Make sure you discuss any questions you have with your health care provider. Document Revised: 01/28/2017 Document Reviewed: 02/09/2016 Elsevier Patient Education  2020 Elsevier Inc.  

## 2019-11-13 LAB — CBC WITH DIFFERENTIAL/PLATELET
Absolute Monocytes: 525 cells/uL (ref 200–950)
Basophils Absolute: 60 cells/uL (ref 0–200)
Basophils Relative: 0.8 %
Eosinophils Absolute: 203 cells/uL (ref 15–500)
Eosinophils Relative: 2.7 %
HCT: 37 % (ref 35.0–45.0)
Hemoglobin: 12.6 g/dL (ref 11.7–15.5)
Lymphs Abs: 2033 cells/uL (ref 850–3900)
MCH: 33.6 pg — ABNORMAL HIGH (ref 27.0–33.0)
MCHC: 34.1 g/dL (ref 32.0–36.0)
MCV: 98.7 fL (ref 80.0–100.0)
MPV: 11.2 fL (ref 7.5–12.5)
Monocytes Relative: 7 %
Neutro Abs: 4680 cells/uL (ref 1500–7800)
Neutrophils Relative %: 62.4 %
Platelets: 168 10*3/uL (ref 140–400)
RBC: 3.75 10*6/uL — ABNORMAL LOW (ref 3.80–5.10)
RDW: 12.6 % (ref 11.0–15.0)
Total Lymphocyte: 27.1 %
WBC: 7.5 10*3/uL (ref 3.8–10.8)

## 2019-11-13 LAB — BASIC METABOLIC PANEL WITH GFR
BUN/Creatinine Ratio: 14 (calc) (ref 6–22)
BUN: 14 mg/dL (ref 7–25)
CO2: 30 mmol/L (ref 20–32)
Calcium: 10.2 mg/dL (ref 8.6–10.4)
Chloride: 102 mmol/L (ref 98–110)
Creat: 1.02 mg/dL — ABNORMAL HIGH (ref 0.60–0.88)
GFR, Est African American: 58 mL/min/{1.73_m2} — ABNORMAL LOW (ref >=60)
GFR, Est Non African American: 50 mL/min/{1.73_m2} — ABNORMAL LOW (ref >=60)
Glucose, Bld: 87 mg/dL (ref 65–139)
Potassium: 3.9 mmol/L (ref 3.5–5.3)
Sodium: 140 mmol/L (ref 135–146)

## 2019-11-19 MED FILL — METOPROLOL TARTRATE 25 MG T: 25 | 90 days supply | Qty: 180 | Fill #2

## 2019-11-20 ENCOUNTER — Ambulatory Visit (HOSPITAL_COMMUNITY): Payer: Medicare Other | Attending: Cardiovascular Disease

## 2019-11-20 ENCOUNTER — Other Ambulatory Visit: Payer: Self-pay

## 2019-11-20 DIAGNOSIS — Z952 Presence of prosthetic heart valve: Secondary | ICD-10-CM | POA: Diagnosis not present

## 2019-11-20 LAB — ECHOCARDIOGRAM COMPLETE
AR max vel: 0.73 cm2
AV Area VTI: 0.84 cm2
AV Area mean vel: 0.83 cm2
AV Mean grad: 16 mmHg
AV Peak grad: 29.4 mmHg
Ao pk vel: 2.71 m/s
Area-P 1/2: 3.07 cm2
S' Lateral: 2.4 cm

## 2019-11-27 ENCOUNTER — Other Ambulatory Visit: Payer: Self-pay

## 2019-11-27 DIAGNOSIS — Z953 Presence of xenogenic heart valve: Secondary | ICD-10-CM

## 2019-11-27 DIAGNOSIS — I350 Nonrheumatic aortic (valve) stenosis: Secondary | ICD-10-CM

## 2019-11-27 NOTE — Progress Notes (Signed)
echo

## 2019-12-04 ENCOUNTER — Ambulatory Visit (INDEPENDENT_AMBULATORY_CARE_PROVIDER_SITE_OTHER): Payer: Medicare Other | Admitting: Pharmacist Clinician (PhC)/ Clinical Pharmacy Specialist

## 2019-12-04 ENCOUNTER — Other Ambulatory Visit: Payer: Self-pay

## 2019-12-04 DIAGNOSIS — I10 Essential (primary) hypertension: Secondary | ICD-10-CM | POA: Diagnosis not present

## 2019-12-04 MED ORDER — VALSARTAN 160 MG PO TABS: ORAL_TABLET | ORAL | 3 refills | Status: DC

## 2019-12-04 NOTE — Patient Instructions (Addendum)
Call us when you start the valsartan, so we can schedule an appointment to check your blood pressure  Call Dmetrius Ambs/[name] at [phone]  Check your blood pressure at home daily (if able) and keep record of the readings.  Take your BP meds as follows:  Finish off your current bottle of losartan 100 mg.  When this is gone, start valsartan 160 mg once daily, at the same time.  We will need to see you about 3-4 weeks after starting valsartan to check your blood pressure again.  Bring all of your meds, your BP cuff and your record of home blood pressures to your next appointment.  Exercise as you're able, try to walk approximately 30 minutes per day.  Keep salt intake to a minimum, especially watch canned and prepared boxed foods.  Eat more fresh fruits and vegetables and fewer canned items.  Avoid eating in fast food restaurants.    HOW TO TAKE YOUR BLOOD PRESSURE: . Rest 5 minutes before taking your blood pressure. .  Don't smoke or drink caffeinated beverages for at least 30 minutes before. . Take your blood pressure before (not after) you eat. . Sit comfortably with your back supported and both feet on the floor (don't cross your legs). . Elevate your arm to heart level on a table or a desk. . Use the proper sized cuff. It should fit smoothly and snugly around your bare upper arm. There should be enough room to slip a fingertip under the cuff. The bottom edge of the cuff should be 1 inch above the crease of the elbow. . Ideally, take 3 measurements at one sitting and record the average.

## 2019-12-04 NOTE — Progress Notes (Signed)
12/11/2019 [name] 13-Nov-1931 [id number]   HPI:  [name] is a 86 y.o. female patient of Dr Gwenlyn Found, with a PMH below who presents today for hypertension clinic evaluation. When she saw Dr. Gwenlyn Found in August her pressure was elevated at 176/80.  She noted that some home readings were also similarly elevated, however many were lower.  He did not change any of her medications, but asked that she keep a log of home readings for a month and follow up with [facility].  Since then, she was seen at her PCP office, where it was essentially unchanged.  Again no changes were made, as home readings were noted to be lower.     Past Medical History: ASCVD Carotid stenosis RECA > 50% blockage  Atrial fibrillation Rate controlled; CHADS2-VASc score 5 (htn, cad, age x 2, female); on Eliquis 5  Aortic stenosis S/p  AV replacement  CKD Scr 1.02; GFR 50  COPD On albuterol MDI prn  GERD On omeprazole 20 mg      Blood Pressure Goal:  130/80  Current Medications: losartan 100 mg qd - mornings, metoprolol tartrate 25 mg bid  Social Hx:  No tobacco, no alcohol, 1 cup coffee in mornings  Diet:  No added salt except for occasional, mostly home cooked meals, rarely eats out  Exercise: walking at home, doing housework, nothing strenuous  Home BP readings: home meter about 84 years old, wrist cuff, did not bring with her today, but has list of home readings.  Last 31 readings (2-3 readings/day for past 12 days), show average of 140/72.(range 115-164/63-85)    Intolerances: lisinopril caused cough  Labs: 9/21:  Na 140, K 3.9, Glu 87, BUN 14, SCr 1.02, GFR 50  Wt Readings from Last 3 Encounters:  12/04/19 156 lb (70.8 kg)  11/12/19 148 lb (67.1 kg)  10/30/19 149 lb 9.6 oz (67.9 kg)   BP Readings from Last 3 Encounters:  12/04/19 (!) 160/90  11/12/19 (!) 170/92  10/30/19 (!) 176/80   Pulse Readings from Last 3 Encounters:  12/04/19 70  11/12/19 61  10/30/19 77    Current Outpatient  Medications  Medication Sig Dispense Refill  . acetaminophen (TYLENOL) 325 MG tablet Take 650 mg by mouth at bedtime as needed.     Marland Kitchen acetaminophen (TYLENOL) 650 MG CR tablet Take 650 mg by mouth. every morning    . albuterol (PROAIR HFA) 108 (90 Base) MCG/ACT inhaler INHALE 1 PUFF INTO THE LUNGS EVERY 6 (SIX) HOURS AS NEEDED FOR WHEEZING OR SHORTNESS OF BREATH. 8.5 Inhaler 2  . amoxicillin (AMOXIL) 500 MG tablet Take 4 by mouth 1 hour prior to dental procedure    . Ascorbic Acid (VITAMIN C) 100 MG tablet Take 100 mg by mouth daily.    . Calcium Citrate-Vitamin D (CITRACAL + D PO) Take 1 tablet by mouth 2 (two) times daily.     . Chlorpheniramine Maleate (EQ CHLORTABS PO) Take 1 tablet by mouth every 4 (four) hours as needed.    Marland Kitchen dextromethorphan-guaiFENesin (MUCINEX DM) 30-600 MG 12hr tablet Take 1 tablet by mouth 2 (two) times daily as needed.     Marland Kitchen ELIQUIS 5 MG TABS tablet TAKE 1 TABLET (5 MG TOTAL) BY MOUTH 2 (TWO) TIMES DAILY. 180 tablet 1  . famotidine (PEPCID) 20 MG tablet One after supper    . fluticasone (FLONASE) 50 MCG/ACT nasal spray Place 1 spray into both nostrils 2 (two) times daily as needed for allergies or  rhinitis.    . hydrOXYzine (ATARAX/VISTARIL) 25 MG tablet Take one tablet by mouth daily as needed for itching. 30 tablet 3  . Magnesium Hydroxide (PHILLIPS MILK OF MAGNESIA PO) Take 15 mL by mouth. Take daily at 6 o'clock at night    . metoprolol tartrate (LOPRESSOR) 25 MG tablet TAKE 1 TABLET BY MOUTH TWICE A DAY 180 tablet 2  . mirabegron ER (MYRBETRIQ) 50 MG TB24 tablet Take 1 tablet (50 mg total) by mouth daily. 90 tablet 1  . Omega-3 Fatty Acids (FISH OIL PO) Take 1 capsule by mouth daily.    Marland Kitchen omeprazole (PRILOSEC) 20 MG capsule Take 20 mg by mouth daily.    . shark liver oil-cocoa butter (PREPARATION H) 0.25-3-85.5 % suppository Place 1 suppository rectally as needed.    Marland Kitchen Specialty Vitamins Products (ADVANCED COLLAGEN PO) Take 1,000 mg by mouth daily.    . vitamin E  400 UNIT capsule Take 400 Units by mouth daily.    . valsartan (DIOVAN) 160 MG tablet Take 1 tablet by mouth daily.  Start after your losartan prescription runs out. 30 tablet 3   No current facility-administered medications for this visit.    Allergies  Allergen Reactions  . Lisinopril Cough  . Codeine Rash    All over the body.    Past Medical History:  Diagnosis Date  . Aortic stenosis 07/20/2012   Aortic stenosis   . Aortic valve disorder 08/13/2011   ECHO - EF >04%; mild diastolic dysfunction; calcified aortic valve, not well visualized; mod/severe aortic stenosis w/ worsening gradients when compared to 2012  . Arthritis   . Cancer ([place])    Breast  . Carotid bruit 08/06/2008   Doppler - R ECA demonstrates noarrowing w/ elevated velocities consistent w/ >70% diameter reduction; R and L ICAs show no evidence of diameter reduction, significant tortuosity or vascular abnormality;   . Change in voice   . COPD (chronic obstructive pulmonary disease) ([place])   . Hearing loss   . Heart murmur   . Hyperlipidemia   . Hypertension    dr Gwenlyn Found  . Osteoporosis   . PAF (paroxysmal atrial fibrillation) ([place]) 09/04/2012  . PVD (peripheral vascular disease) ([place]) 08/13/2010   R/P MV - normal pattern of perfusion in all regions, EF 76%; no significant wall abnormalities noted; normal perfusion study  . Right carotid bruit    high-grade right external carotid artery stenosis by 2 parts ultrasound 2 years ago  . S/P aortic valve replacement with bioprosthetic valve 08/30/2012   21 mm [facility] Ease bovine pericardial tissue valve    Blood pressure (!) 160/90, pulse 70, resp. rate 16, height 5\' 3"  (1.6 m), weight 156 lb (70.8 kg), SpO2 97 %.  Essential hypertension Patient with essential hypertension, still not well controlled.  Discussed the option of adding another medication (chlorthalidone) versus switching losartan to valsartan.  Patient prefers the idea of switching to valsartan, but would  like to finish up current bottle before switching.  Agreeable with this.  She is to continue losartan until gone, then when she starts the valsartan she will need to call and let us know, and we can schedule a one month follow up from that time.     Tommy Medal PharmD CPP [facility] [address] [place], [place] [address] [phone]

## 2019-12-11 NOTE — Assessment & Plan Note (Addendum)
Patient with essential hypertension, still not well controlled.  Discussed the option of adding another medication (chlorthalidone) versus switching losartan to valsartan.  Patient prefers the idea of switching to valsartan, but would like to finish up current bottle before switching.  Agreeable with this.  She is to continue losartan until gone, then when she starts the valsartan she will need to call and let us know, and we can schedule a one month follow up from that time.

## 2019-12-26 MED FILL — MYRBETRIQ ER 50 MG TABLET: 50 | 30 days supply | Qty: 30 | Fill #0

## 2020-01-11 DIAGNOSIS — J385 Laryngeal spasm: Secondary | ICD-10-CM | POA: Diagnosis not present

## 2020-01-16 ENCOUNTER — Other Ambulatory Visit: Payer: Self-pay | Admitting: Cardiovascular Disease

## 2020-01-16 ENCOUNTER — Telehealth: Payer: Self-pay

## 2020-01-16 MED ORDER — VALSARTAN 160 MG PO TABS: ORAL_TABLET | ORAL | 3 refills | Status: DC

## 2020-01-16 NOTE — Telephone Encounter (Signed)
Pt called requesting refill of the valsartan

## 2020-01-17 MED FILL — VALSARTAN 160 MG TABLET: 160 | 30 days supply | Qty: 30 | Fill #0

## 2020-01-30 MED FILL — ELIQUIS 5 MG TABLET: 5 | 30 days supply | Qty: 60 | Fill #4

## 2020-02-20 ENCOUNTER — Other Ambulatory Visit: Payer: Self-pay | Admitting: Cardiovascular Disease

## 2020-02-20 MED FILL — METOPROLOL TARTRATE 25 MG T: 25 | 90 days supply | Qty: 180 | Fill #0

## 2020-02-25 ENCOUNTER — Other Ambulatory Visit: Payer: Self-pay

## 2020-02-25 ENCOUNTER — Encounter: Payer: Self-pay | Admitting: Pharmacist

## 2020-02-25 ENCOUNTER — Ambulatory Visit (INDEPENDENT_AMBULATORY_CARE_PROVIDER_SITE_OTHER): Payer: Medicare Other | Admitting: Pharmacist

## 2020-02-25 VITALS — BP 164/98 | HR 76 | Resp 15 | Ht 63.0 in | Wt 150.6 lb

## 2020-02-25 DIAGNOSIS — I10 Essential (primary) hypertension: Secondary | ICD-10-CM

## 2020-02-25 MED ORDER — VALSARTAN 80 MG PO TABS: ORAL_TABLET | ORAL | 2 refills | Status: DC

## 2020-02-25 MED ORDER — METOPROLOL TARTRATE 25 MG PO TABS: ORAL_TABLET | ORAL | 3 refills | Status: DC

## 2020-02-25 MED FILL — VALSARTAN 80 MG TABLET: 80 | 30 days supply | Qty: 45 | Fill #0

## 2020-02-25 NOTE — Progress Notes (Signed)
HPI:  [name] is a 86 y.o. female patient of Dr Gwenlyn Found, with a PMH below who presents today for hypertension clinic follow up. Valsartan 160mg  daily was started during OV on 12/04/2019. Patient denies problems with current therapy and reports compliance. No dizziness, swelling, increased fatigue or headaches. Will need to repeat BMET if patient tolerating Valsartan therapy.   Past Medical History: ASCVD Carotid stenosis RECA > 50% blockage  Atrial fibrillation Rate controlled; CHADS2-VASc score 5 (htn, cad, age x 2, female); on Eliquis 5  Aortic stenosis S/p  AV replacement  CKD Scr 1.02; GFR 50  COPD On albuterol MDI prn  GERD On omeprazole 20 mg      Blood Pressure Goal:  130/80 (140/90 acceptable if unable to tolerate lower number)  Current Medications:  valsartan 160mg  - mornings  metoprolol tartrate 25 mg twice daily (9am and 9pm)  Social Hx:  No tobacco, no alcohol, 1 cup coffee in mornings  Diet:  No added salt except for occasional, mostly home cooked meals, rarely eats out  Exercise: walking at home, doing housework, nothing strenuous  Home BP readings: home meter about 84 years old, wrist cuff, accurate when compared to manual readings.    6 morning readings; average 142/69, HR range 46-60bpm 16 evening readings, average 136/66, HR range 48-65bpm  Intolerances: lisinopril caused cough  Labs:  9/21:  Na 140, K 3.9, Glu 87, BUN 14, SCr 1.02, GFR 50  Wt Readings from Last 3 Encounters:  02/25/20 150 lb 9.6 oz (68.3 kg)  12/04/19 156 lb (70.8 kg)  11/12/19 148 lb (67.1 kg)   BP Readings from Last 3 Encounters:  02/25/20 (!) 164/98  12/04/19 (!) 160/90  11/12/19 (!) 170/92   Pulse Readings from Last 3 Encounters:  02/25/20 76  12/04/19 70  11/12/19 61    Current Outpatient Medications  Medication Sig Dispense Refill  . acetaminophen (TYLENOL) 325 MG tablet Take 650 mg by mouth at bedtime as needed.     Marland Kitchen acetaminophen (TYLENOL) 650 MG CR tablet  Take 650 mg by mouth. every morning    . albuterol (PROAIR HFA) 108 (90 Base) MCG/ACT inhaler INHALE 1 PUFF INTO THE LUNGS EVERY 6 (SIX) HOURS AS NEEDED FOR WHEEZING OR SHORTNESS OF BREATH. 8.5 Inhaler 2  . amoxicillin (AMOXIL) 500 MG tablet Take 4 by mouth 1 hour prior to dental procedure    . Ascorbic Acid (VITAMIN C) 100 MG tablet Take 100 mg by mouth daily.    . Calcium Citrate-Vitamin D (CITRACAL + D PO) Take 1 tablet by mouth 2 (two) times daily.     . Chlorpheniramine Maleate (EQ CHLORTABS PO) Take 1 tablet by mouth every 4 (four) hours as needed.    Marland Kitchen dextromethorphan-guaiFENesin (MUCINEX DM) 30-600 MG 12hr tablet Take 1 tablet by mouth 2 (two) times daily as needed.     Marland Kitchen ELIQUIS 5 MG TABS tablet TAKE 1 TABLET (5 MG TOTAL) BY MOUTH 2 (TWO) TIMES DAILY. 180 tablet 1  . famotidine (PEPCID) 20 MG tablet One after supper    . fluticasone (FLONASE) 50 MCG/ACT nasal spray Place 1 spray into both nostrils 2 (two) times daily as needed for allergies or rhinitis.    . hydrOXYzine (ATARAX/VISTARIL) 25 MG tablet Take one tablet by mouth daily as needed for itching. 30 tablet 3  . Magnesium Hydroxide (PHILLIPS MILK OF MAGNESIA PO) Take 15 mL by mouth. Take daily at 6 o'clock at night    . mirabegron ER (  MYRBETRIQ) 50 MG TB24 tablet Take 1 tablet (50 mg total) by mouth daily. 90 tablet 1  . Omega-3 Fatty Acids (FISH OIL PO) Take 1 capsule by mouth daily.    Marland Kitchen omeprazole (PRILOSEC) 20 MG capsule Take 20 mg by mouth daily.    . shark liver oil-cocoa butter (PREPARATION H) 0.25-3-85.5 % suppository Place 1 suppository rectally as needed.    Marland Kitchen Specialty Vitamins Products (ADVANCED COLLAGEN PO) Take 1,000 mg by mouth daily.    . valsartan (DIOVAN) 80 MG tablet Take 2 tablets (160 mg total) by mouth in the morning AND 1 tablet (80 mg total) every evening. 45 tablet 2  . vitamin E 400 UNIT capsule Take 400 Units by mouth daily.    . metoprolol tartrate (LOPRESSOR) 25 MG tablet TAKE 1 TABLET (25 MG TOTAL) BY  MOUTH 2 (TWO) TIMES DAILY. 180 tablet 3   No current facility-administered medications for this visit.    Allergies  Allergen Reactions  . Lisinopril Cough  . Codeine Rash    All over the body.    Past Medical History:  Diagnosis Date  . Aortic stenosis 07/20/2012   Aortic stenosis   . Aortic valve disorder 08/13/2011   ECHO - EF >55%; mild diastolic dysfunction; calcified aortic valve, not well visualized; mod/severe aortic stenosis w/ worsening gradients when compared to 2012  . Arthritis   . Cancer ([facility])    Breast  . Carotid bruit 08/06/2008   Doppler - R ECA demonstrates noarrowing w/ elevated velocities consistent w/ >70% diameter reduction; R and L ICAs show no evidence of diameter reduction, significant tortuosity or vascular abnormality;   . Change in voice   . COPD (chronic obstructive pulmonary disease) ([facility])   . Hearing loss   . Heart murmur   . Hyperlipidemia   . Hypertension    dr Allyson Sabal  . Osteoporosis   . PAF (paroxysmal atrial fibrillation) ([facility]) 09/04/2012  . PVD (peripheral vascular disease) ([facility]) 08/13/2010   R/P MV - normal pattern of perfusion in all regions, EF 76%; no significant wall abnormalities noted; normal perfusion study  . Right carotid bruit    high-grade right external carotid artery stenosis by 2 parts ultrasound 2 years ago  . S/P aortic valve replacement with bioprosthetic valve 08/30/2012   21 mm [facility] Ease bovine pericardial tissue valve    Blood pressure (!) 164/98, pulse 76, resp. rate 15, height 5\' 3"  (1.6 m), weight 150 lb 9.6 oz (68.3 kg), SpO2 98 %.  Essential hypertension Blood pressure remains above goal today and at home. Patient denies problems with current valsartan therapy and agrees to titrate dose. Will increase valsartan to 160mg  every morning and 80mg  every evening. Patient will repeat BMET today and call back in few days to schedule follow up visit. Plan to see patient in 4-5 weeks and continue valsartan titration if  needed.   Alicen Donalson Rodriguez-Guzman PharmD, BCPS, CPP [facility] [address] [place] [facility] 02/26/2020 2:48 PM

## 2020-02-25 NOTE — Patient Instructions (Addendum)
Return for a  follow up appointment in 8 weeks (February 21 or 22)  Check your blood pressure at home daily (if able) and keep record of the readings.  Take your BP meds as follows: INCREASE Valsartan to 160mg  every morning and 80mg  every evening  Bring all of your meds, your BP cuff and your record of home blood pressures to your next appointment.  Exercise as you're able, try to walk approximately 30 minutes per day.  Keep salt intake to a minimum, especially watch canned and prepared boxed foods.  Eat more fresh fruits and vegetables and fewer canned items.  Avoid eating in fast food restaurants.    HOW TO TAKE YOUR BLOOD PRESSURE: . Rest 5 minutes before taking your blood pressure. .  Don't smoke or drink caffeinated beverages for at least 30 minutes before. . Take your blood pressure before (not after) you eat. . Sit comfortably with your back supported and both feet on the floor (don't cross your legs). . Elevate your arm to heart level on a table or a desk. . Use the proper sized cuff. It should fit smoothly and snugly around your bare upper arm. There should be enough room to slip a fingertip under the cuff. The bottom edge of the cuff should be 1 inch above the crease of the elbow. . Ideally, take 3 measurements at one sitting and record the average.

## 2020-02-26 ENCOUNTER — Encounter: Payer: Self-pay | Admitting: Pharmacist

## 2020-02-26 LAB — BASIC METABOLIC PANEL
BUN/Creatinine Ratio: 12 (ref 12–28)
BUN: 13 mg/dL (ref 8–27)
CO2: 23 mmol/L (ref 20–29)
Calcium: 10.1 mg/dL (ref 8.7–10.3)
Chloride: 102 mmol/L (ref 96–106)
Creatinine, Ser: 1.1 mg/dL — ABNORMAL HIGH (ref 0.57–1.00)
GFR calc Af Amer: 53 mL/min/{1.73_m2} — ABNORMAL LOW (ref >59)
GFR calc non Af Amer: 46 mL/min/{1.73_m2} — ABNORMAL LOW (ref >59)
Glucose: 88 mg/dL (ref 65–99)
Potassium: 4.3 mmol/L (ref 3.5–5.2)
Sodium: 140 mmol/L (ref 134–144)

## 2020-02-26 MED FILL — MYRBETRIQ ER 50 MG TABLET: 50 | 30 days supply | Qty: 30 | Fill #1

## 2020-02-26 NOTE — Assessment & Plan Note (Signed)
Blood pressure remains above goal today and at home. Patient denies problems with current valsartan therapy and agrees to titrate dose. Will increase valsartan to 160mg  every morning and 80mg  every evening. Patient will repeat BMET today and call back in few days to schedule follow up visit. Plan to see patient in 4-5 weeks and continue valsartan titration if needed.

## 2020-03-12 ENCOUNTER — Other Ambulatory Visit: Payer: Self-pay

## 2020-03-12 ENCOUNTER — Other Ambulatory Visit: Payer: Self-pay | Admitting: Cardiovascular Disease

## 2020-03-12 ENCOUNTER — Other Ambulatory Visit: Payer: Self-pay | Admitting: Pharmacist

## 2020-03-12 ENCOUNTER — Telehealth: Payer: Self-pay | Admitting: Cardiovascular Disease

## 2020-03-12 MED ORDER — VALSARTAN 160 MG PO TABS: 160.0000 mg | ORAL_TABLET | Freq: Every morning | ORAL | 1 refills | Status: DC

## 2020-03-12 MED ORDER — VALSARTAN 80 MG PO TABS: ORAL_TABLET | ORAL | 3 refills | Status: DC

## 2020-03-12 MED ORDER — VALSARTAN 80 MG PO TABS: 80.0000 mg | ORAL_TABLET | Freq: Every evening | ORAL | 1 refills | Status: DC

## 2020-03-12 MED FILL — VALSARTAN 80 MG TABLET: 80 | 15 days supply | Qty: 45 | Fill #1

## 2020-03-12 MED FILL — ELIQUIS 5 MG TABLET: 5 | 30 days supply | Qty: 60 | Fill #5

## 2020-03-12 NOTE — Telephone Encounter (Signed)
Pt c/o medication issue:  1. Name of Medication: valsartan (DIOVAN) 80 MG tablet  2. How are you currently taking this medication (dosage and times per day)? N/A  3. Are you having a reaction (difficulty breathing--STAT)? N/A  4. What is your medication issue? [name] is calling stating [name]'s insurance will not cover this prescription with it prescribed for her to take it this way. She states in order for it to be covered they need two new prescriptions sent. She states one needs to be 80 MG's to take once at night and the other prescription needs to be 160 MG's for her to take in the morning. They have removed the original prescription due to this. Please advise.

## 2020-03-19 ENCOUNTER — Ambulatory Visit: Payer: Medicare Other | Admitting: Nurse Practitioner

## 2020-03-20 MED FILL — VALSARTAN 80 MG TABLET: 80 | 30 days supply | Qty: 30 | Fill #0

## 2020-03-20 MED FILL — MYRBETRIQ ER 50 MG TABLET: 50 | 30 days supply | Qty: 30 | Fill #2

## 2020-03-20 MED FILL — VALSARTAN 160 MG TABLET: 160 | 30 days supply | Qty: 30 | Fill #0

## 2020-04-02 ENCOUNTER — Encounter: Payer: Self-pay | Admitting: Nurse Practitioner

## 2020-04-02 ENCOUNTER — Ambulatory Visit (INDEPENDENT_AMBULATORY_CARE_PROVIDER_SITE_OTHER): Payer: Medicare Other | Admitting: Nurse Practitioner

## 2020-04-02 ENCOUNTER — Other Ambulatory Visit: Payer: Self-pay | Admitting: Nurse Practitioner

## 2020-04-02 ENCOUNTER — Other Ambulatory Visit: Payer: Self-pay

## 2020-04-02 VITALS — BP 130/80 | HR 73 | Temp 97.3°F | Ht 63.0 in | Wt 151.6 lb

## 2020-04-02 DIAGNOSIS — N183 Chronic kidney disease, stage 3 unspecified: Secondary | ICD-10-CM

## 2020-04-02 DIAGNOSIS — H6121 Impacted cerumen, right ear: Secondary | ICD-10-CM

## 2020-04-02 DIAGNOSIS — N3281 Overactive bladder: Secondary | ICD-10-CM | POA: Diagnosis not present

## 2020-04-02 DIAGNOSIS — J449 Chronic obstructive pulmonary disease, unspecified: Secondary | ICD-10-CM

## 2020-04-02 DIAGNOSIS — G44309 Post-traumatic headache, unspecified, not intractable: Secondary | ICD-10-CM | POA: Diagnosis not present

## 2020-04-02 DIAGNOSIS — I480 Paroxysmal atrial fibrillation: Secondary | ICD-10-CM | POA: Diagnosis not present

## 2020-04-02 DIAGNOSIS — R058 Other specified cough: Secondary | ICD-10-CM | POA: Diagnosis not present

## 2020-04-02 DIAGNOSIS — I10 Essential (primary) hypertension: Secondary | ICD-10-CM

## 2020-04-02 LAB — CBC WITH DIFFERENTIAL/PLATELET
Absolute Monocytes: 593 cells/uL (ref 200–950)
Basophils Absolute: 62 cells/uL (ref 0–200)
Basophils Relative: 0.8 %
Eosinophils Absolute: 208 cells/uL (ref 15–500)
Eosinophils Relative: 2.7 %
HCT: 36.6 % (ref 35.0–45.0)
Hemoglobin: 12.5 g/dL (ref 11.7–15.5)
Lymphs Abs: 2379 cells/uL (ref 850–3900)
MCH: 33.5 pg — ABNORMAL HIGH (ref 27.0–33.0)
MCHC: 34.2 g/dL (ref 32.0–36.0)
MCV: 98.1 fL (ref 80.0–100.0)
MPV: 11 fL (ref 7.5–12.5)
Monocytes Relative: 7.7 %
Neutro Abs: 4458 cells/uL (ref 1500–7800)
Neutrophils Relative %: 57.9 %
Platelets: 164 10*3/uL (ref 140–400)
RBC: 3.73 10*6/uL — ABNORMAL LOW (ref 3.80–5.10)
RDW: 12.3 % (ref 11.0–15.0)
Total Lymphocyte: 30.9 %
WBC: 7.7 10*3/uL (ref 3.8–10.8)

## 2020-04-02 MED ORDER — FLUTICASONE PROPIONATE 50 MCG/ACT NA SUSP: 1.0000 | Freq: Two times a day (BID) | NASAL | 2 refills | Status: DC | PRN

## 2020-04-02 MED FILL — FLUTICASONE PROP 50 MCG SPR: 50 | 30 days supply | Qty: 16 | Fill #0

## 2020-04-02 NOTE — Progress Notes (Signed)
Careteam: Patient Care Team: Lauree Chandler, NP as PCP - General (Geriatric Medicine) Druscilla Brownie, MD as Consulting Physician (Dermatology) [name] Irani, MD (Inactive) as Consulting Physician (Gastroenterology) Neldon Mc, MD as Consulting Physician (General Surgery) Lorretta Harp, MD as Consulting Physician (Cardiology) Bjorn Loser, MD as Consulting Physician (Urology) Lavell Anchors, MD as Consulting Physician (Ophthalmology) Pieter Partridge, DO as Consulting Physician (Neurology)  PLACE OF SERVICE:  [place] Directive information Does Patient Have a Medical Advance Directive?: No, Would patient like information on creating a medical advance directive?: No - Patient declined  Allergies  Allergen Reactions  . Lisinopril Cough  . Codeine Rash    All over the body.    Chief Complaint  Patient presents with  . Medical Management of Chronic Issues    4 month follow up. Check up on blood pressure.     HPI: Patient is a 87 y.o. female for routine follow up Following closely with cardiology due to elevated bp. Most recently with increase in valsartan to 160 mg in the am and 80 mg in the pm. Blood pressures have been 116-148/65-87 Reports she is tolerating medication.  No headaches,   Review of Systems:  Review of Systems  Constitutional: Negative for chills, fever and weight loss.  HENT: Negative for tinnitus.   Respiratory: Positive for cough (on and off). Negative for sputum production and shortness of breath.   Cardiovascular: Negative for chest pain, palpitations and leg swelling.  Gastrointestinal: Negative for abdominal pain, constipation, diarrhea and heartburn.  Genitourinary: Negative for dysuria, frequency and urgency.  Musculoskeletal: Positive for joint pain. Negative for back pain, falls and myalgias.  Skin: Negative.   Neurological: Negative for dizziness and headaches.  Endo/Heme/Allergies: Positive for environmental  allergies.  Psychiatric/Behavioral: Negative for depression and memory loss. The patient does not have insomnia.     Past Medical History:  Diagnosis Date  . Aortic stenosis 07/20/2012   Aortic stenosis   . Aortic valve disorder 08/13/2011   ECHO - EF 123456; mild diastolic dysfunction; calcified aortic valve, not well visualized; mod/severe aortic stenosis w/ worsening gradients when compared to 2012  . Arthritis   . Cancer ([place])    Breast  . Carotid bruit 08/06/2008   Doppler - R ECA demonstrates noarrowing w/ elevated velocities consistent w/ >70% diameter reduction; R and L ICAs show no evidence of diameter reduction, significant tortuosity or vascular abnormality;   . Change in voice   . COPD (chronic obstructive pulmonary disease) ([place])   . Hearing loss   . Heart murmur   . Hyperlipidemia   . Hypertension    dr Gwenlyn Found  . Osteoporosis   . PAF (paroxysmal atrial fibrillation) ([place]) 09/04/2012  . PVD (peripheral vascular disease) ([place]) 08/13/2010   R/P MV - normal pattern of perfusion in all regions, EF 76%; no significant wall abnormalities noted; normal perfusion study  . Right carotid bruit    high-grade right external carotid artery stenosis by 2 parts ultrasound 2 years ago  . S/P aortic valve replacement with bioprosthetic valve 08/30/2012   21 mm [facility] Ease bovine pericardial tissue valve   Past Surgical History:  Procedure Laterality Date  . ABDOMINAL HYSTERECTOMY     Partial  . AORTIC VALVE REPLACEMENT N/A 08/30/2012   Procedure: AORTIC VALVE REPLACEMENT (AVR);  Surgeon: Rexene Alberts, MD;  Location: [facility];  Service: Open Heart Surgery;  Laterality: N/A;  . BIOPSY SHOULDER Left 10/08/2005   shave biopsy  .  BREAST LUMPECTOMY  02/25/1997   right  . CARDIAC CATHETERIZATION    . [place]  . COSMETIC SURGERY Left 1997   Breast implant  . EYE SURGERY  1997   Cataract surgery  . INTRAOPERATIVE TRANSESOPHAGEAL ECHOCARDIOGRAM N/A 08/30/2012   Procedure:  INTRAOPERATIVE TRANSESOPHAGEAL ECHOCARDIOGRAM;  Surgeon: Rexene Alberts, MD;  Location: [facility];  Service: Open Heart Surgery;  Laterality: N/A;  . LEFT AND RIGHT HEART CATHETERIZATION WITH CORONARY ANGIOGRAM N/A 08/14/2012   Procedure: LEFT AND RIGHT HEART CATHETERIZATION WITH CORONARY ANGIOGRAM;  Surgeon: Lorretta Harp, MD;  Location[facility] C[facility] LAB;  Service: Cardiovascular;  Laterality: N/A;  . LEFT HEART CATH  08/14/12   Nl cors, AS  . MASTECTOMY  09/29/95   left  . PARTIAL HYSTERECTOMY    . TOTAL HIP ARTHROPLASTY  09/2010   Social History:   reports that she has never smoked. She has never used smokeless tobacco. She reports that she does not drink alcohol and does not use drugs.  Family History  Problem Relation Age of Onset  . Cancer Mother        Bladder  . Early death Father        Tractor accident  . Early death Brother        Radiation protection practitioner  . Early death Brother        during heart surgery    Medications: Patient's Medications  New Prescriptions   No medications on file  Previous Medications   ACETAMINOPHEN (TYLENOL) 325 MG TABLET    Take 650 mg by mouth at bedtime as needed.    ACETAMINOPHEN (TYLENOL) 650 MG CR TABLET    Take 650 mg by mouth. every morning   ALBUTEROL (PROAIR HFA) 108 (90 BASE) MCG/ACT INHALER    INHALE 1 PUFF INTO THE LUNGS EVERY 6 (SIX) HOURS AS NEEDED FOR WHEEZING OR SHORTNESS OF BREATH.   AMOXICILLIN (AMOXIL) 500 MG TABLET    Take 4 by mouth 1 hour prior to dental procedure   ASCORBIC ACID (VITAMIN C) 100 MG TABLET    Take 100 mg by mouth daily.   CALCIUM CITRATE-VITAMIN D (CITRACAL + D PO)    Take 1 tablet by mouth 2 (two) times daily.    CHLORPHENIRAMINE MALEATE (EQ CHLORTABS PO)    Take 1 tablet by mouth every 4 (four) hours as needed.   DEXTROMETHORPHAN-GUAIFENESIN (MUCINEX DM) 30-600 MG 12HR TABLET    Take 1 tablet by mouth 2 (two) times daily as needed.    ELIQUIS 5 MG TABS TABLET    TAKE 1 TABLET (5 MG TOTAL) BY MOUTH 2 (TWO) TIMES DAILY.    FAMOTIDINE (PEPCID) 20 MG TABLET    One after supper   FLUTICASONE (FLONASE) 50 MCG/ACT NASAL SPRAY    Place 1 spray into both nostrils 2 (two) times daily as needed for allergies or rhinitis.   HYDROXYZINE (ATARAX/VISTARIL) 25 MG TABLET    Take one tablet by mouth daily as needed for itching.   MAGNESIUM HYDROXIDE (PHILLIPS MILK OF MAGNESIA PO)    Take 15 mL by mouth. Take daily at 6 o'clock at night   METOPROLOL TARTRATE (LOPRESSOR) 25 MG TABLET    TAKE 1 TABLET (25 MG TOTAL) BY MOUTH 2 (TWO) TIMES DAILY.   MIRABEGRON ER (MYRBETRIQ) 50 MG TB24 TABLET    Take 1 tablet (50 mg total) by mouth daily.   OMEGA-3 FATTY ACIDS (FISH OIL PO)    Take 1 capsule by mouth daily.  OMEPRAZOLE (PRILOSEC) 20 MG CAPSULE    Take 20 mg by mouth daily.   SHARK LIVER OIL-COCOA BUTTER (PREPARATION H) 0.25-3-85.5 % SUPPOSITORY    Place 1 suppository rectally as needed.   SPECIALTY VITAMINS PRODUCTS (ADVANCED COLLAGEN PO)    Take 1,000 mg by mouth daily.   VALSARTAN (DIOVAN) 160 MG TABLET    Take 1 tablet (160 mg total) by mouth in the morning.   VALSARTAN (DIOVAN) 80 MG TABLET    Take 1 tablet (80 mg total) by mouth every evening.   VITAMIN E 400 UNIT CAPSULE    Take 400 Units by mouth daily.  Modified Medications   No medications on file  Discontinued Medications   No medications on file    Physical Exam:  Vitals:   04/02/20 1537  BP: 130/80  Pulse: 73  Temp: (!) 97.3 F (36.3 C)  TempSrc: Temporal  SpO2: 98%  Weight: 151 lb 9.6 oz (68.8 kg)  Height: 5\' 3"  (1.6 m)   Body mass index is 26.85 kg/m. Wt Readings from Last 3 Encounters:  04/02/20 151 lb 9.6 oz (68.8 kg)  02/25/20 150 lb 9.6 oz (68.3 kg)  12/04/19 156 lb (70.8 kg)    Physical Exam Constitutional:      General: She is not in acute distress.    Appearance: She is well-developed and well-nourished. She is not diaphoretic.  HENT:     Head: Normocephalic and atraumatic.     Mouth/Throat:     Mouth: Oropharynx is clear and moist.      Pharynx: No oropharyngeal exudate.  Eyes:     Conjunctiva/sclera: Conjunctivae normal.     Pupils: Pupils are equal, round, and reactive to light.  Cardiovascular:     Rate and Rhythm: Normal rate and regular rhythm.     Heart sounds: Murmur heard.    Pulmonary:     Effort: Pulmonary effort is normal.     Breath sounds: Normal breath sounds.  Abdominal:     General: Bowel sounds are normal.     Palpations: Abdomen is soft.  Musculoskeletal:        General: No tenderness or edema.     Cervical back: Normal range of motion and neck supple.  Skin:    General: Skin is warm and dry.  Neurological:     Mental Status: She is alert and oriented to person, place, and time.  Psychiatric:        Mood and Affect: Mood and affect normal.        Behavior: Behavior normal.     Labs reviewed: Basic Metabolic Panel: Recent Labs    06/18/19 0813 11/12/19 1557 02/25/20 1459  NA 140 140 140  K 4.1 3.9 4.3  CL 105 102 102  CO2 29 30 23   GLUCOSE 96 87 88  BUN 20 14 13   CREATININE 1.01* 1.02* 1.10*  CALCIUM 9.9 10.2 10.1   Liver Function Tests: Recent Labs    06/18/19 0813  AST 18  ALT 14  BILITOT 0.6  PROT 6.0*   No results for input(s): LIPASE, AMYLASE in the last 8760 hours. No results for input(s): AMMONIA in the last 8760 hours. CBC: Recent Labs    06/18/19 0813 11/12/19 1557  WBC 5.3 7.5  NEUTROABS 2,639 4,680  HGB 12.5 12.6  HCT 36.9 37.0  MCV 98.1 98.7  PLT 159 168   Lipid Panel: Recent Labs    06/18/19 0813  CHOL 147  HDL 58  LDLCALC 75  TRIG 60  CHOLHDL 2.5   TSH: No results for input(s): TSH in the last 8760 hours. A1C: Lab Results  Component Value Date   HGBA1C 5.5 08/28/2012     Assessment/Plan 1. Chronic obstructive pulmonary disease, unspecified COPD type ([place]) -stable, has albuterol to use PRN.   2. PAF (paroxysmal atrial fibrillation) ([facility]) Stable rate controlled on metoprolol and uses eliquis for anticoagulation. No signs of  bleeding or bruising. No swelling in LE, chest pains.  - CBC with Differential/Platelet  3. Upper airway cough syndrome Stable at this time, continues with supportive care - fluticasone (FLONASE) 50 MCG/ACT nasal spray; Place 1 spray into both nostrils 2 (two) times daily as needed for allergies or rhinitis.  Dispense: 11.1 mL; Refill: 2  4. Overactive bladder Stable on mirabegron 50 mg daily   5. Essential hypertension Improved control with recent titration of valsartan and dietary modifications. To continue current regimen and keep follow up with cardiology.   6. Stage 3 chronic kidney disease, unspecified whether stage 3a or 3b CKD ([place]) Encourage proper hydration and to avoid NSAIDS (Aleve, Advil, Motrin, Ibuprofen)   7. Post-traumatic headache, not intractable, unspecified chronicity pattern Stable without recent headaches.   8. Impacted cerumen of right ear -ear lavage done, used grabber to remove wax from ear. pt tolerated well.  Next appt: 6 months.  Carlos American. [place], [facility] [phone]

## 2020-04-03 NOTE — Addendum Note (Signed)
Addended by: Carroll Kinds on: 04/03/2020 09:03 AM   Modules accepted: Orders

## 2020-04-10 ENCOUNTER — Other Ambulatory Visit: Payer: Self-pay | Admitting: Nurse Practitioner

## 2020-04-10 DIAGNOSIS — Z8739 Personal history of other diseases of the musculoskeletal system and connective tissue: Secondary | ICD-10-CM

## 2020-04-10 DIAGNOSIS — Z1231 Encounter for screening mammogram for malignant neoplasm of breast: Secondary | ICD-10-CM

## 2020-04-16 MED FILL — VALSARTAN 80 MG TABLET: 80 | 30 days supply | Qty: 30 | Fill #1

## 2020-04-16 MED FILL — VALSARTAN 160 MG TABLET: 160 | 30 days supply | Qty: 30 | Fill #1

## 2020-04-16 MED FILL — MYRBETRIQ ER 50 MG TABLET: 50 | 30 days supply | Qty: 30 | Fill #3

## 2020-04-22 ENCOUNTER — Ambulatory Visit: Payer: Medicare Other

## 2020-04-25 DIAGNOSIS — J385 Laryngeal spasm: Secondary | ICD-10-CM | POA: Diagnosis not present

## 2020-05-07 ENCOUNTER — Other Ambulatory Visit: Payer: Self-pay | Admitting: Cardiovascular Disease

## 2020-05-07 MED FILL — ELIQUIS 5 MG TABLET: 5 | 30 days supply | Qty: 60 | Fill #0

## 2020-05-07 NOTE — Telephone Encounter (Signed)
82f, 68.8kg, scr 1.10 02/25/20, lovw/berry 10/30/19

## 2020-05-21 MED FILL — METOPROLOL TARTRATE 25 MG T: 25 | 90 days supply | Qty: 180 | Fill #1

## 2020-05-31 ENCOUNTER — Other Ambulatory Visit (HOSPITAL_COMMUNITY): Payer: Self-pay

## 2020-06-18 ENCOUNTER — Other Ambulatory Visit (HOSPITAL_COMMUNITY): Payer: Self-pay

## 2020-06-18 MED FILL — Valsartan Tab 80 MG: ORAL | 90 days supply | Qty: 90 | Fill #0 | Status: AC

## 2020-06-18 MED FILL — Valsartan Tab 160 MG: ORAL | 90 days supply | Qty: 90 | Fill #0 | Status: AC

## 2020-06-18 MED FILL — Apixaban Tab 5 MG: ORAL | 30 days supply | Qty: 60 | Fill #0 | Status: AC

## 2020-06-18 MED FILL — Mirabegron Tab ER 24 HR 50 MG: ORAL | 30 days supply | Qty: 30 | Fill #0 | Status: AC

## 2020-06-23 ENCOUNTER — Telehealth: Payer: Self-pay | Admitting: *Deleted

## 2020-06-23 ENCOUNTER — Inpatient Hospital Stay (HOSPITAL_COMMUNITY)
Admission: EM | Admit: 2020-06-23 | Discharge: 2020-06-27 | DRG: 064 | Disposition: A | Discharge status: Home Health | Payer: Medicare Other | Attending: Internal Medicine | Admitting: Internal Medicine

## 2020-06-23 ENCOUNTER — Emergency Department (HOSPITAL_COMMUNITY): Payer: Medicare Other

## 2020-06-23 DIAGNOSIS — Z90710 Acquired absence of both cervix and uterus: Secondary | ICD-10-CM

## 2020-06-23 DIAGNOSIS — I6381 Other cerebral infarction due to occlusion or stenosis of small artery: Secondary | ICD-10-CM | POA: Diagnosis not present

## 2020-06-23 DIAGNOSIS — Z20822 Contact with and (suspected) exposure to covid-19: Secondary | ICD-10-CM | POA: Diagnosis present

## 2020-06-23 DIAGNOSIS — I480 Paroxysmal atrial fibrillation: Secondary | ICD-10-CM | POA: Diagnosis present

## 2020-06-23 DIAGNOSIS — N1831 Chronic kidney disease, stage 3a: Secondary | ICD-10-CM | POA: Diagnosis present

## 2020-06-23 DIAGNOSIS — R29706 NIHSS score 6: Secondary | ICD-10-CM | POA: Diagnosis not present

## 2020-06-23 DIAGNOSIS — N3000 Acute cystitis without hematuria: Secondary | ICD-10-CM | POA: Diagnosis not present

## 2020-06-23 DIAGNOSIS — I4891 Unspecified atrial fibrillation: Secondary | ICD-10-CM | POA: Diagnosis present

## 2020-06-23 DIAGNOSIS — Z953 Presence of xenogenic heart valve: Secondary | ICD-10-CM

## 2020-06-23 DIAGNOSIS — Z888 Allergy status to other drugs, medicaments and biological substances status: Secondary | ICD-10-CM

## 2020-06-23 DIAGNOSIS — R011 Cardiac murmur, unspecified: Secondary | ICD-10-CM | POA: Diagnosis present

## 2020-06-23 DIAGNOSIS — G9341 Metabolic encephalopathy: Secondary | ICD-10-CM | POA: Diagnosis not present

## 2020-06-23 DIAGNOSIS — R531 Weakness: Secondary | ICD-10-CM | POA: Diagnosis not present

## 2020-06-23 DIAGNOSIS — M19012 Primary osteoarthritis, left shoulder: Secondary | ICD-10-CM | POA: Diagnosis present

## 2020-06-23 DIAGNOSIS — I129 Hypertensive chronic kidney disease with stage 1 through stage 4 chronic kidney disease, or unspecified chronic kidney disease: Secondary | ICD-10-CM | POA: Diagnosis present

## 2020-06-23 DIAGNOSIS — Z853 Personal history of malignant neoplasm of breast: Secondary | ICD-10-CM

## 2020-06-23 DIAGNOSIS — H9190 Unspecified hearing loss, unspecified ear: Secondary | ICD-10-CM | POA: Diagnosis present

## 2020-06-23 DIAGNOSIS — Z7901 Long term (current) use of anticoagulants: Secondary | ICD-10-CM

## 2020-06-23 DIAGNOSIS — T361X5A Adverse effect of cephalosporins and other beta-lactam antibiotics, initial encounter: Secondary | ICD-10-CM | POA: Diagnosis not present

## 2020-06-23 DIAGNOSIS — L270 Generalized skin eruption due to drugs and medicaments taken internally: Secondary | ICD-10-CM | POA: Diagnosis not present

## 2020-06-23 DIAGNOSIS — I160 Hypertensive urgency: Secondary | ICD-10-CM | POA: Diagnosis present

## 2020-06-23 DIAGNOSIS — M810 Age-related osteoporosis without current pathological fracture: Secondary | ICD-10-CM | POA: Diagnosis present

## 2020-06-23 DIAGNOSIS — R519 Headache, unspecified: Secondary | ICD-10-CM | POA: Diagnosis not present

## 2020-06-23 DIAGNOSIS — R29702 NIHSS score 2: Secondary | ICD-10-CM | POA: Diagnosis not present

## 2020-06-23 DIAGNOSIS — E785 Hyperlipidemia, unspecified: Secondary | ICD-10-CM | POA: Diagnosis present

## 2020-06-23 DIAGNOSIS — Z885 Allergy status to narcotic agent status: Secondary | ICD-10-CM

## 2020-06-23 DIAGNOSIS — I639 Cerebral infarction, unspecified: Principal | ICD-10-CM

## 2020-06-23 DIAGNOSIS — Z8673 Personal history of transient ischemic attack (TIA), and cerebral infarction without residual deficits: Secondary | ICD-10-CM

## 2020-06-23 DIAGNOSIS — Z9882 Breast implant status: Secondary | ICD-10-CM

## 2020-06-23 DIAGNOSIS — R52 Pain, unspecified: Secondary | ICD-10-CM

## 2020-06-23 DIAGNOSIS — G9389 Other specified disorders of brain: Secondary | ICD-10-CM | POA: Diagnosis not present

## 2020-06-23 DIAGNOSIS — R29705 NIHSS score 5: Secondary | ICD-10-CM | POA: Diagnosis not present

## 2020-06-23 DIAGNOSIS — M4722 Other spondylosis with radiculopathy, cervical region: Secondary | ICD-10-CM | POA: Diagnosis present

## 2020-06-23 DIAGNOSIS — J449 Chronic obstructive pulmonary disease, unspecified: Secondary | ICD-10-CM | POA: Diagnosis present

## 2020-06-23 DIAGNOSIS — Z79899 Other long term (current) drug therapy: Secondary | ICD-10-CM

## 2020-06-23 DIAGNOSIS — I739 Peripheral vascular disease, unspecified: Secondary | ICD-10-CM | POA: Diagnosis present

## 2020-06-23 DIAGNOSIS — N183 Chronic kidney disease, stage 3 unspecified: Secondary | ICD-10-CM | POA: Diagnosis present

## 2020-06-23 DIAGNOSIS — R482 Apraxia: Secondary | ICD-10-CM | POA: Diagnosis present

## 2020-06-23 DIAGNOSIS — R29704 NIHSS score 4: Secondary | ICD-10-CM | POA: Diagnosis not present

## 2020-06-23 DIAGNOSIS — R29708 NIHSS score 8: Secondary | ICD-10-CM | POA: Diagnosis not present

## 2020-06-23 LAB — I-STAT CHEM 8, ED
BUN: 19 mg/dL (ref 8–23)
Calcium, Ion: 1.21 mmol/L (ref 1.15–1.40)
Chloride: 100 mmol/L (ref 98–111)
Creatinine, Ser: 1.1 mg/dL — ABNORMAL HIGH (ref 0.44–1.00)
Glucose, Bld: 160 mg/dL — ABNORMAL HIGH (ref 70–99)
HCT: 40 % (ref 36.0–46.0)
Hemoglobin: 13.6 g/dL (ref 12.0–15.0)
Potassium: 3.8 mmol/L (ref 3.5–5.1)
Sodium: 139 mmol/L (ref 135–145)
TCO2: 27 mmol/L (ref 22–32)

## 2020-06-23 LAB — COMPREHENSIVE METABOLIC PANEL
ALT: 19 U/L (ref 0–44)
AST: 22 U/L (ref 15–41)
Albumin: 3.8 g/dL (ref 3.5–5.0)
Alkaline Phosphatase: 55 U/L (ref 38–126)
Anion gap: 9 (ref 5–15)
BUN: 17 mg/dL (ref 8–23)
CO2: 27 mmol/L (ref 22–32)
Calcium: 9.6 mg/dL (ref 8.9–10.3)
Chloride: 100 mmol/L (ref 98–111)
Creatinine, Ser: 1.14 mg/dL — ABNORMAL HIGH (ref 0.44–1.00)
GFR, Estimated: 47 mL/min — ABNORMAL LOW (ref >60)
Glucose, Bld: 162 mg/dL — ABNORMAL HIGH (ref 70–99)
Potassium: 3.8 mmol/L (ref 3.5–5.1)
Sodium: 136 mmol/L (ref 135–145)
Total Bilirubin: 0.8 mg/dL (ref 0.3–1.2)
Total Protein: 6.3 g/dL — ABNORMAL LOW (ref 6.5–8.1)

## 2020-06-23 LAB — RAPID URINE DRUG SCREEN, HOSP PERFORMED
Amphetamines: NOT DETECTED
Barbiturates: NOT DETECTED
Benzodiazepines: NOT DETECTED
Cocaine: NOT DETECTED
Opiates: NOT DETECTED
Tetrahydrocannabinol: NOT DETECTED

## 2020-06-23 LAB — CBC
HCT: 40.2 % (ref 36.0–46.0)
Hemoglobin: 13.3 g/dL (ref 12.0–15.0)
MCH: 33.1 pg (ref 26.0–34.0)
MCHC: 33.1 g/dL (ref 30.0–36.0)
MCV: 100 fL (ref 80.0–100.0)
Platelets: 160 10*3/uL (ref 150–400)
RBC: 4.02 MIL/uL (ref 3.87–5.11)
RDW: 13 % (ref 11.5–15.5)
WBC: 6.5 10*3/uL (ref 4.0–10.5)
nRBC: 0 % (ref 0.0–0.2)

## 2020-06-23 LAB — URINALYSIS, ROUTINE W REFLEX MICROSCOPIC
Bacteria, UA: NONE SEEN
Bilirubin Urine: NEGATIVE
Glucose, UA: NEGATIVE mg/dL
Hgb urine dipstick: NEGATIVE
Ketones, ur: NEGATIVE mg/dL
Nitrite: NEGATIVE
Protein, ur: NEGATIVE mg/dL
Specific Gravity, Urine: 1.011 (ref 1.005–1.030)
pH: 7 (ref 5.0–8.0)

## 2020-06-23 LAB — PROTIME-INR
INR: 1.3 — ABNORMAL HIGH (ref 0.8–1.2)
Prothrombin Time: 16 seconds — ABNORMAL HIGH (ref 11.4–15.2)

## 2020-06-23 LAB — DIFFERENTIAL
Abs Immature Granulocytes: 0.02 10*3/uL (ref 0.00–0.07)
Basophils Absolute: 0.1 10*3/uL (ref 0.0–0.1)
Basophils Relative: 1 %
Eosinophils Absolute: 0.2 10*3/uL (ref 0.0–0.5)
Eosinophils Relative: 2 %
Immature Granulocytes: 0 %
Lymphocytes Relative: 24 %
Lymphs Abs: 1.6 10*3/uL (ref 0.7–4.0)
Monocytes Absolute: 0.5 10*3/uL (ref 0.1–1.0)
Monocytes Relative: 8 %
Neutro Abs: 4.2 10*3/uL (ref 1.7–7.7)
Neutrophils Relative %: 65 %

## 2020-06-23 LAB — APTT: aPTT: 37 seconds — ABNORMAL HIGH (ref 24–36)

## 2020-06-23 LAB — ETHANOL: Alcohol, Ethyl (B): <10 mg/dL (ref <10)

## 2020-06-23 NOTE — ED Triage Notes (Signed)
Pt arrives via POV from home with weakness, patient reports is worse on her left side. No droop, drift, slurred speech, VAN negative. Symptoms began yesterday. C/o headache per son. Pt awake, alert, VSS.

## 2020-06-23 NOTE — ED Triage Notes (Signed)
Emergency Medicine Provider Triage Evaluation Note  [name] , a 87 y.o. female  was evaluated in triage.  Pt complains of headache and left-sided weakness that started last night. No changes to speech or vision. No previous CVA. Son is with patient who notes that patient has been moving slower than normal.  Review of Systems  Positive: weakness Negative: fever  Physical Exam  There were no vitals taken for this visit. Gen:   Awake, no distress   HEENT:  Atraumatic  Resp:  Normal effort Cardiac:  Normal rate  Abd:   Nondistended, nontender  MSK:   Moves extremities without difficulty  Neuro:  Speech clear   Medical Decision Making  Medically screening exam initiated at 7:16 PM.  Appropriate orders placed.  [name] was informed that the remainder of the evaluation will be completed by another provider, this initial triage assessment does not replace that evaluation, and the importance of remaining in the ED until their evaluation is complete.  Clinical Impression  Headache and weakness. No weakness appreciate on exam. Out of stroke window. Stroke labs ordered. CT head.    Suzy Bouchard, [place] 06/23/20 1921

## 2020-06-23 NOTE — Telephone Encounter (Signed)
Patient daughter, [name] called and stated that they think patient is having a aneurysm or stoke. Stated that she is acting different and does not know where she is at. Family is concerned. Stated that this has been going on since this morning.  Instructed her that they should call 911 to have patient evaluated. Daughter agreed.

## 2020-06-24 ENCOUNTER — Telehealth: Payer: Self-pay

## 2020-06-24 ENCOUNTER — Emergency Department (HOSPITAL_COMMUNITY): Payer: Medicare Other

## 2020-06-24 ENCOUNTER — Inpatient Hospital Stay (HOSPITAL_COMMUNITY): Payer: Medicare Other

## 2020-06-24 ENCOUNTER — Other Ambulatory Visit (HOSPITAL_COMMUNITY): Payer: Self-pay

## 2020-06-24 DIAGNOSIS — L270 Generalized skin eruption due to drugs and medicaments taken internally: Secondary | ICD-10-CM | POA: Diagnosis not present

## 2020-06-24 DIAGNOSIS — I129 Hypertensive chronic kidney disease with stage 1 through stage 4 chronic kidney disease, or unspecified chronic kidney disease: Secondary | ICD-10-CM | POA: Diagnosis present

## 2020-06-24 DIAGNOSIS — I639 Cerebral infarction, unspecified: Secondary | ICD-10-CM | POA: Diagnosis not present

## 2020-06-24 DIAGNOSIS — I517 Cardiomegaly: Secondary | ICD-10-CM | POA: Diagnosis not present

## 2020-06-24 DIAGNOSIS — I160 Hypertensive urgency: Secondary | ICD-10-CM | POA: Diagnosis not present

## 2020-06-24 DIAGNOSIS — M5011 Cervical disc disorder with radiculopathy,  high cervical region: Secondary | ICD-10-CM | POA: Diagnosis not present

## 2020-06-24 DIAGNOSIS — Z9882 Breast implant status: Secondary | ICD-10-CM | POA: Diagnosis not present

## 2020-06-24 DIAGNOSIS — E785 Hyperlipidemia, unspecified: Secondary | ICD-10-CM | POA: Diagnosis present

## 2020-06-24 DIAGNOSIS — I6389 Other cerebral infarction: Secondary | ICD-10-CM

## 2020-06-24 DIAGNOSIS — I4811 Longstanding persistent atrial fibrillation: Secondary | ICD-10-CM | POA: Diagnosis not present

## 2020-06-24 DIAGNOSIS — M4319 Spondylolisthesis, multiple sites in spine: Secondary | ICD-10-CM | POA: Diagnosis not present

## 2020-06-24 DIAGNOSIS — R531 Weakness: Secondary | ICD-10-CM | POA: Diagnosis not present

## 2020-06-24 DIAGNOSIS — R29708 NIHSS score 8: Secondary | ICD-10-CM | POA: Diagnosis not present

## 2020-06-24 DIAGNOSIS — Z20822 Contact with and (suspected) exposure to covid-19: Secondary | ICD-10-CM | POA: Diagnosis present

## 2020-06-24 DIAGNOSIS — Z7901 Long term (current) use of anticoagulants: Secondary | ICD-10-CM

## 2020-06-24 DIAGNOSIS — G9341 Metabolic encephalopathy: Secondary | ICD-10-CM | POA: Diagnosis present

## 2020-06-24 DIAGNOSIS — R29704 NIHSS score 4: Secondary | ICD-10-CM | POA: Diagnosis not present

## 2020-06-24 DIAGNOSIS — I6300 Cerebral infarction due to thrombosis of unspecified precerebral artery: Secondary | ICD-10-CM | POA: Diagnosis not present

## 2020-06-24 DIAGNOSIS — N1831 Chronic kidney disease, stage 3a: Secondary | ICD-10-CM

## 2020-06-24 DIAGNOSIS — R29705 NIHSS score 5: Secondary | ICD-10-CM | POA: Diagnosis not present

## 2020-06-24 DIAGNOSIS — R011 Cardiac murmur, unspecified: Secondary | ICD-10-CM | POA: Diagnosis present

## 2020-06-24 DIAGNOSIS — I6381 Other cerebral infarction due to occlusion or stenosis of small artery: Secondary | ICD-10-CM | POA: Diagnosis present

## 2020-06-24 DIAGNOSIS — Z853 Personal history of malignant neoplasm of breast: Secondary | ICD-10-CM | POA: Diagnosis not present

## 2020-06-24 DIAGNOSIS — I739 Peripheral vascular disease, unspecified: Secondary | ICD-10-CM | POA: Diagnosis present

## 2020-06-24 DIAGNOSIS — T361X5A Adverse effect of cephalosporins and other beta-lactam antibiotics, initial encounter: Secondary | ICD-10-CM | POA: Diagnosis not present

## 2020-06-24 DIAGNOSIS — I4891 Unspecified atrial fibrillation: Secondary | ICD-10-CM | POA: Diagnosis not present

## 2020-06-24 DIAGNOSIS — Z953 Presence of xenogenic heart valve: Secondary | ICD-10-CM | POA: Diagnosis not present

## 2020-06-24 DIAGNOSIS — M50123 Cervical disc disorder at C6-C7 level with radiculopathy: Secondary | ICD-10-CM | POA: Diagnosis not present

## 2020-06-24 DIAGNOSIS — Z8673 Personal history of transient ischemic attack (TIA), and cerebral infarction without residual deficits: Secondary | ICD-10-CM | POA: Diagnosis not present

## 2020-06-24 DIAGNOSIS — R29706 NIHSS score 6: Secondary | ICD-10-CM | POA: Diagnosis not present

## 2020-06-24 DIAGNOSIS — R29702 NIHSS score 2: Secondary | ICD-10-CM | POA: Diagnosis not present

## 2020-06-24 DIAGNOSIS — N3000 Acute cystitis without hematuria: Secondary | ICD-10-CM | POA: Diagnosis present

## 2020-06-24 DIAGNOSIS — G9389 Other specified disorders of brain: Secondary | ICD-10-CM | POA: Diagnosis not present

## 2020-06-24 DIAGNOSIS — M19012 Primary osteoarthritis, left shoulder: Secondary | ICD-10-CM | POA: Diagnosis present

## 2020-06-24 DIAGNOSIS — I480 Paroxysmal atrial fibrillation: Secondary | ICD-10-CM | POA: Diagnosis present

## 2020-06-24 DIAGNOSIS — J449 Chronic obstructive pulmonary disease, unspecified: Secondary | ICD-10-CM

## 2020-06-24 DIAGNOSIS — R519 Headache, unspecified: Secondary | ICD-10-CM | POA: Diagnosis not present

## 2020-06-24 DIAGNOSIS — R29898 Other symptoms and signs involving the musculoskeletal system: Secondary | ICD-10-CM | POA: Diagnosis not present

## 2020-06-24 DIAGNOSIS — M4722 Other spondylosis with radiculopathy, cervical region: Secondary | ICD-10-CM | POA: Diagnosis present

## 2020-06-24 LAB — TSH: TSH: 2.22 u[IU]/mL (ref 0.350–4.500)

## 2020-06-24 LAB — LIPID PANEL
Cholesterol: 169 mg/dL (ref 0–200)
HDL: 66 mg/dL (ref >40)
LDL Cholesterol: 92 mg/dL (ref 0–99)
Total CHOL/HDL Ratio: 2.6 RATIO
Triglycerides: 54 mg/dL (ref <150)
VLDL: 11 mg/dL (ref 0–40)

## 2020-06-24 LAB — HEMOGLOBIN A1C
Hgb A1c MFr Bld: 5.3 % (ref 4.8–5.6)
Mean Plasma Glucose: 105.41 mg/dL

## 2020-06-24 LAB — ECHOCARDIOGRAM COMPLETE
AR max vel: 0.64 cm2
AV Area VTI: 0.76 cm2
AV Area mean vel: 0.66 cm2
AV Mean grad: 8.8 mmHg
AV Peak grad: 19.9 mmHg
Ao pk vel: 2.23 m/s
Height: 64 in
S' Lateral: 2.2 cm
Weight: 2560 oz

## 2020-06-24 LAB — SARS CORONAVIRUS 2 (TAT 6-24 HRS): SARS Coronavirus 2: NEGATIVE

## 2020-06-24 LAB — VITAMIN B12: Vitamin B-12: 687 pg/mL (ref 180–914)

## 2020-06-24 MED ORDER — SENNOSIDES-DOCUSATE SODIUM 8.6-50 MG PO TABS
1.0000 | ORAL_TABLET | Freq: Every evening | ORAL | Status: DC | PRN
  Administered 2020-06-26: 1 via ORAL
  Filled 2020-06-24: qty 1

## 2020-06-24 MED ORDER — APIXABAN 5 MG PO TABS
5.0000 mg | ORAL_TABLET | Freq: Two times a day (BID) | ORAL | Status: DC
  Administered 2020-06-24: 5 mg via ORAL
  Filled 2020-06-24: qty 1

## 2020-06-24 MED ORDER — ACETAMINOPHEN 160 MG/5ML PO SOLN: 650.0000 mg | ORAL | Status: DC | PRN

## 2020-06-24 MED ORDER — CEPHALEXIN 500 MG PO CAPS
500.0000 mg | ORAL_CAPSULE | Freq: Two times a day (BID) | ORAL | 0 refills | Status: DC
  Filled 2020-06-24: qty 14, 7d supply, fill #0

## 2020-06-24 MED ORDER — SODIUM CHLORIDE 0.9 % IV SOLN
1.0000 g | INTRAVENOUS | Status: AC
  Administered 2020-06-25 – 2020-06-26 (×2): 1 g via INTRAVENOUS
  Filled 2020-06-24 (×2): qty 10

## 2020-06-24 MED ORDER — ACETAMINOPHEN 325 MG PO TABS
650.0000 mg | ORAL_TABLET | ORAL | Status: DC | PRN
  Administered 2020-06-24 – 2020-06-26 (×3): 650 mg via ORAL
  Filled 2020-06-24 (×3): qty 2

## 2020-06-24 MED ORDER — ALBUTEROL SULFATE (2.5 MG/3ML) 0.083% IN NEBU: 2.5000 mg | INHALATION_SOLUTION | Freq: Four times a day (QID) | RESPIRATORY_TRACT | Status: DC | PRN

## 2020-06-24 MED ORDER — ATORVASTATIN CALCIUM 40 MG PO TABS
40.0000 mg | ORAL_TABLET | Freq: Every day | ORAL | Status: DC
  Administered 2020-06-24 – 2020-06-27 (×4): 40 mg via ORAL
  Filled 2020-06-24 (×4): qty 1

## 2020-06-24 MED ORDER — SODIUM CHLORIDE 0.9 % IV SOLN: Freq: Once | INTRAVENOUS | Status: AC

## 2020-06-24 MED ORDER — IOHEXOL 350 MG/ML SOLN
75.0000 mL | Freq: Once | INTRAVENOUS | Status: AC | PRN
  Administered 2020-06-24: 75 mL via INTRAVENOUS

## 2020-06-24 MED ORDER — ACETAMINOPHEN 650 MG RE SUPP: 650.0000 mg | RECTAL | Status: DC | PRN

## 2020-06-24 MED ORDER — STROKE: EARLY STAGES OF RECOVERY BOOK
Freq: Once | Status: AC
  Filled 2020-06-24: qty 1

## 2020-06-24 MED ORDER — SODIUM CHLORIDE 0.9 % IV SOLN: 1.0000 g | Freq: Once | INTRAVENOUS | Status: DC

## 2020-06-24 MED ORDER — SODIUM CHLORIDE 0.9 % IV SOLN
1.0000 g | Freq: Once | INTRAVENOUS | Status: AC
  Administered 2020-06-24: 1 g via INTRAVENOUS
  Filled 2020-06-24: qty 10

## 2020-06-24 MED ORDER — ACETAMINOPHEN 325 MG PO TABS
650.0000 mg | ORAL_TABLET | Freq: Once | ORAL | Status: AC
  Administered 2020-06-24: 650 mg via ORAL
  Filled 2020-06-24: qty 2

## 2020-06-24 MED ORDER — ASPIRIN EC 81 MG PO TBEC
81.0000 mg | DELAYED_RELEASE_TABLET | Freq: Every day | ORAL | Status: DC
  Administered 2020-06-24 – 2020-06-25 (×2): 81 mg via ORAL
  Filled 2020-06-24 (×2): qty 1

## 2020-06-24 NOTE — ED Notes (Signed)
Lab to add urine culture.

## 2020-06-24 NOTE — ED Notes (Signed)
Patient transported to MRI 

## 2020-06-24 NOTE — Consult Note (Addendum)
Neurology Consultation  Reason for Consult: stroke on MRI brain Referring Physician: Penni Bombard., MD   CC: confusion  History is obtained from: son and chart  HPI: [name] is a 87 y.o. female with a PMHx of HTN, AS s/p bioprosthetic valve replacement 2014, PVC, AF rate controlled on beta blocker on therapeutic AC with eliquis, laryngeal spasms s/p botox, CKD III, HLD, COPD, OA, and osteoporosis who presented to ED 06/23/20 with c/o confusion. MRI brain revealed a left thalamic stroke.   Son is at bedside and patient at test, so history comes from him at this time. Son lives with the patient and is very familiar with the patient's normal state and activity, etc. Son states that 4 days ago, he noticed some very small changes in his mother's thinking which became more frequent over the next 3 days. Normally, the patient can perform her own ADLs including showering, dressing, and preparing small meals. She fills her own pill box and usually does not have to be reminded to take her medications. She also still drives and has no difficulty with that, like losing her way, etc. Per son, she was having difficulties with tasks that she normally performs herself. Examples of this, include, pills not taken from her box and confusing the TV remote with her telephone. She did not act "sick" but was not acting her normal self. She was also not eating much and moving slower. She was also more sleepy than usual. Son stated she had no new arm or leg weakness, inability to use a limb, nor did patient complain of sensory changes or numbness/tingling.   Per son, patient also has either arthritis or rotator cuff issue and can not normally life her left arm above her head. No personal history of stroke (even though old stroke on MRI brain) or FMHx of stroke.   Last dose of Eliquis unknown because patient began missing some meds 4 days ago.   ED workup revealed a possible UTI without leukocytosis or left shift, but  due to concern that her symptoms could be due to stroke, a MRI brain was obtained. Patient presented with HTN, which we will allow at this point due to stroke. Patient is having a CTA head and neck at this time.   In review of chart, her last PCP appointment was 11/12/2019 and no changes in medications were noted. Dr. Gwenlyn Found is her cardiologist, Dr. Melvyn Novas is her pulmonologist, and Dr. Tomi Likens is her neurologist. Last cardiology appointment on 10/30/2019. Last neurology appointment for HA s/p trauma was 05/03/2019 with no need for f/up.   Because of finding of thalamic stroke, neurology was consulted.   LKW: 4 days ago  ROS: Unable to obtain due to altered mental status.   Past Medical History:  Diagnosis Date  . Aortic stenosis 07/20/2012   Aortic stenosis   . Aortic valve disorder 08/13/2011   ECHO - EF 123456; mild diastolic dysfunction; calcified aortic valve, not well visualized; mod/severe aortic stenosis w/ worsening gradients when compared to 2012  . Arthritis   . Cancer ([place])    Breast  . Carotid bruit 08/06/2008   Doppler - R ECA demonstrates noarrowing w/ elevated velocities consistent w/ >70% diameter reduction; R and L ICAs show no evidence of diameter reduction, significant tortuosity or vascular abnormality;   . Change in voice   . COPD (chronic obstructive pulmonary disease) ([place])   . Hearing loss   . Heart murmur   . Hyperlipidemia   . Hypertension  dr Gwenlyn Found  . Osteoporosis   . PAF (paroxysmal atrial fibrillation) ([place]) 09/04/2012  . PVD (peripheral vascular disease) ([place]) 08/13/2010   R/P MV - normal pattern of perfusion in all regions, EF 76%; no significant wall abnormalities noted; normal perfusion study  . Right carotid bruit    high-grade right external carotid artery stenosis by 2 parts ultrasound 2 years ago  . S/P aortic valve replacement with bioprosthetic valve 08/30/2012   21 mm [facility] Ease bovine pericardial tissue valve    Family History  Problem Relation  Age of Onset  . Cancer Mother        Bladder  . Early death Father        Tractor accident  . Early death Brother        Radiation protection practitioner  . Early death Brother        during heart surgery    Social History:   reports that she has never smoked. She has never used smokeless tobacco. She reports that she does not drink alcohol and does not use drugs.  Medications  Current Facility-Administered Medications:  .   stroke: mapping our early stages of recovery book, , Does not apply, Once, Smith, Rondell A, MD .  0.9 %  sodium chloride infusion, , Intravenous, Once, Smith, Rondell A, MD .  acetaminophen (TYLENOL) tablet 650 mg, 650 mg, Oral, Q4H PRN **OR** acetaminophen (TYLENOL) 160 MG/5ML solution 650 mg, 650 mg, Per Tube, Q4H PRN **OR** acetaminophen (TYLENOL) suppository 650 mg, 650 mg, Rectal, Q4H PRN, Smith, Rondell A, MD .  albuterol (PROVENTIL) (2.5 MG/3ML) 0.083% nebulizer solution 2.5 mg, 2.5 mg, Nebulization, Q6H PRN, Smith, Rondell A, MD .  atorvastatin (LIPITOR) tablet 40 mg, 40 mg, Oral, Daily, Smith, Rondell A, MD .  senna-docusate (Senokot-S) tablet 1 tablet, 1 tablet, Oral, QHS PRN, Norval Morton, MD  Current Outpatient Medications:  .  cephALEXin (KEFLEX) 500 MG capsule, Take 1 capsule (500 mg total) by mouth 2 (two) times daily., Disp: 14 capsule, Rfl: 0 .  acetaminophen (TYLENOL) 325 MG tablet, Take 650 mg by mouth at bedtime as needed. , Disp: , Rfl:  .  acetaminophen (TYLENOL) 650 MG CR tablet, Take 650 mg by mouth. every morning, Disp: , Rfl:  .  albuterol (PROAIR HFA) 108 (90 Base) MCG/ACT inhaler, INHALE 1 PUFF INTO THE LUNGS EVERY 6 (SIX) HOURS AS NEEDED FOR WHEEZING OR SHORTNESS OF BREATH., Disp: 8.5 Inhaler, Rfl: 2 .  amoxicillin (AMOXIL) 500 MG tablet, Take 4 by mouth 1 hour prior to dental procedure, Disp: , Rfl:  .  apixaban (ELIQUIS) 5 MG TABS tablet, TAKE 1 TABLET BY MOUTH TWICE DAILY., Disp: 180 tablet, Rfl: 1 .  Ascorbic Acid (VITAMIN C) 100 MG tablet, Take 100  mg by mouth daily., Disp: , Rfl:  .  Calcium Citrate-Vitamin D (CITRACAL + D PO), Take 1 tablet by mouth 2 (two) times daily. , Disp: , Rfl:  .  Chlorpheniramine Maleate (EQ CHLORTABS PO), Take 1 tablet by mouth every 4 (four) hours as needed., Disp: , Rfl:  .  dextromethorphan-guaiFENesin (MUCINEX DM) 30-600 MG 12hr tablet, Take 1 tablet by mouth 2 (two) times daily as needed. , Disp: , Rfl:  .  famotidine (PEPCID) 20 MG tablet, One after supper, Disp: , Rfl:  .  fluticasone (FLONASE) 50 MCG/ACT nasal spray, PLACE 1 SPRAY INTO BOTH NOSTRILS TWICE DAILY AS NEEDED FOR ALLERGIES OR RHINITIS., Disp: 16 g, Rfl: 2 .  hydrOXYzine (ATARAX/VISTARIL) 25 MG tablet,  Take one tablet by mouth daily as needed for itching., Disp: 30 tablet, Rfl: 3 .  Magnesium Hydroxide (PHILLIPS MILK OF MAGNESIA PO), Take 15 mL by mouth. Take daily at 6 o'clock at night, Disp: , Rfl:  .  metoprolol tartrate (LOPRESSOR) 25 MG tablet, TAKE 1 TABLET (25 MG TOTAL) BY MOUTH 2 (TWO) TIMES DAILY., Disp: 180 tablet, Rfl: 3 .  metoprolol tartrate (LOPRESSOR) 25 MG tablet, TAKE 1 TABLET (25 MG TOTAL) BY MOUTH 2 (TWO) TIMES DAILY., Disp: 180 tablet, Rfl: 3 .  mirabegron ER (MYRBETRIQ) 50 MG TB24 tablet, Take 1 tablet (50 mg total) by mouth daily., Disp: 90 tablet, Rfl: 1 .  mirabegron ER (MYRBETRIQ) 50 MG TB24 tablet, TAKE 1 TABLET BY MOUTH ONCE DAILY., Disp: 30 tablet, Rfl: 11 .  Omega-3 Fatty Acids (FISH OIL PO), Take 1 capsule by mouth daily., Disp: , Rfl:  .  omeprazole (PRILOSEC) 20 MG capsule, Take 20 mg by mouth daily., Disp: , Rfl:  .  shark liver oil-cocoa butter (PREPARATION H) 0.25-3-85.5 % suppository, Place 1 suppository rectally as needed., Disp: , Rfl:  .  Specialty Vitamins Products (ADVANCED COLLAGEN PO), Take 1,000 mg by mouth daily., Disp: , Rfl:  .  valsartan (DIOVAN) 160 MG tablet, TAKE 1 TABLET (160 MG TOTAL) BY MOUTH IN THE MORNING., Disp: 90 tablet, Rfl: 1 .  valsartan (DIOVAN) 80 MG tablet, TAKE 1 TABLET (80 MG  TOTAL) BY MOUTH EVERY EVENING., Disp: 90 tablet, Rfl: 1 .  vitamin E 400 UNIT capsule, Take 400 Units by mouth daily., Disp: , Rfl:   Exam: Current vital signs: BP (!) 178/92   Pulse 77   Temp 98.3 F (36.8 C)   Resp 20   Ht 5\' 4"  (1.626 m)   Wt 72.6 kg   SpO2 99%   BMI 27.46 kg/m  Vital signs in last 24 hours: Temp:  [98.3 F (36.8 C)] 98.3 F (36.8 C) (04/25 1916) Pulse Rate:  [67-77] 77 (04/26 0715) Resp:  [17-20] 20 (04/26 0715) BP: (168-206)/(79-104) 178/92 (04/26 0715) SpO2:  [93 %-100 %] 99 % (04/26 0715) Weight:  [72.6 kg] 72.6 kg (04/25 1918)  GENERAL: Awake, alert in NAD. Lying in ED bed.  HEENT: - Normocephalic and atraumatic. Moist mucous membranes.  LUNGS - Normal respiratory effort.  CV - RRR on tele ABDOMEN - Soft, nontender Ext: warm, well perfused Psych: Affect appropriate to situation.   NEURO:  Mental Status:  Speech/Language: speech is without dysarthria or aphasia. However, due to laryngeal spasm history, her speech rhythm is not normal. Naming, repetition, fluency, and comprehension intact. Cranial Nerves:  II: PERRL 29mm/brisk. visual fields full.  III, IV, VI: EOMI. Lid elevation symmetric and full.  V: sensation is intact and symmetrical to face. Moves jaw back and forth.  VII: Smile is symmetrical. Able to puff cheeks and raise eyebrows.  VIII: slight bilat hearing impaiment to voice IX, X: palate elevation is symmetric. Hypophonic.  XI: normal sternocleidomastoid and trapezius muscle strength YQM:GNOIBB is symmetrical without fasciculations.   Motor: 4-/5 strength to right grip, right tricep, right bicep. 4/5 left grip, left biceps, left triceps. 4/5 strength to BLEs.  Tone is normal. Bulk is normal.  Sensation- Intact to light touch bilaterally in all four extremities. Extinction absent to light touch to DSS.  Coordination: FTN intact bilaterally. No drift.  DTRs: 2+ bilateral brachioradialis. 0 patella. .  Gait- deferred  1a Level of  Consciousness: 0 1b LOC Questions: 0 1c LOC Commands: 0 2  Best Gaze: 0 3 Visual: 0 4 Facial Palsy: 0 5a Motor Arm - left: 1 5b Motor Arm - Right: 1 6a Motor Leg - Left: 1 6b Motor Leg - Right: 1 7 Limb Ataxia: 0 8 Sensory: 0 9 Best Language: 0 10 Dysarthria: 0 11 Extinct. and Inattention.:0  TOTAL: 4   Labs I have reviewed labs in epic and the results pertinent to this consultation are: INR 1.3    APTT 37   Glucose 160  CBC    Component Value Date/Time   WBC 6.5 06/23/2020 1933   RBC 4.02 06/23/2020 1933   HGB 13.6 06/23/2020 1945   HGB 11.9 05/30/2015 0809   HCT 40.0 06/23/2020 1945   HCT 35.9 05/30/2015 0809   PLT 160 06/23/2020 1933   PLT 214 05/30/2015 0809   MCV 100.0 06/23/2020 1933   MCV 94 05/30/2015 0809   MCH 33.1 06/23/2020 1933   MCHC 33.1 06/23/2020 1933   RDW 13.0 06/23/2020 1933   RDW 13.5 05/30/2015 0809   LYMPHSABS 1.6 06/23/2020 1933   LYMPHSABS 1.8 05/30/2015 0809   MONOABS 0.5 06/23/2020 1933   EOSABS 0.2 06/23/2020 1933   EOSABS 0.2 05/30/2015 0809   BASOSABS 0.1 06/23/2020 1933   BASOSABS 0.0 05/30/2015 0809    CMP     Component Value Date/Time   NA 139 06/23/2020 1945   NA 140 02/25/2020 1459   K 3.8 06/23/2020 1945   CL 100 06/23/2020 1945   CO2 27 06/23/2020 1933   GLUCOSE 160 (H) 06/23/2020 1945   BUN 19 06/23/2020 1945   BUN 13 02/25/2020 1459   CREATININE 1.10 (H) 06/23/2020 1945   CREATININE 1.02 (H) 11/12/2019 1557   CALCIUM 9.6 06/23/2020 1933   PROT 6.3 (L) 06/23/2020 1933   PROT 6.0 12/02/2014 0945   ALBUMIN 3.8 06/23/2020 1933   ALBUMIN 3.9 12/02/2014 0945   AST 22 06/23/2020 1933   ALT 19 06/23/2020 1933   ALKPHOS 55 06/23/2020 1933   BILITOT 0.8 06/23/2020 1933   BILITOT 0.3 12/02/2014 0945   GFRNONAA 47 (L) 06/23/2020 1933   GFRNONAA 50 (L) 11/12/2019 1557   GFRAA 53 (L) 02/25/2020 1459   GFRAA 58 (L) 11/12/2019 1557   Imaging CT head 1. No acute intracranial abnormality. 2. Mild diffuse cerebral  atrophy and small vessel ischemia  MRI brain 1. Acute to subacute infarct in the left thalamus. No associated hemorrhage or mass effect. 2. A chronic lacunar infarct in the contralateral right thalamus is new since January last year. And there are several subacute to early chronic small bilateral white matter infarcts superimposed on advanced chronic white matter Disease.  CTA head and neck pending  Assessment: 87 yo female with PMHx significant for AS s/p biprosthetic valve replacement, AF on chronic AC, HLD, and HTN. ED workup for confusion revealed a acute to subacute left thalamic stroke on MRI brain. Also noted was a chronic lacunar right thalamus, several subacute to early chronic bilateral white matteer infarcts with advanced chronic white matter disease. Patient also found to possible have an UTI. Stroke risk factors include AF, HTN, and HLD. Would think patient would have sensory involvement with thalamic stroke, but none noted. Her comprehension is intact now. Patient will be admitted for stroke workup.  Impression: 1. Left thalamic stroke on MRI brain. 2. encephalopathy  Recommendations:  All have been ordered. -medicine admit -hold Eliquis, will defer to stroke team as to restart date. Stroke appears early subacute on MRI brain. ASA 81mg   daily while eliquis held. -awaiting results of CTA head and neck -labs: TSH, fasting lipid panel, HbA1c, Vitamin B12.  -Echocardiogram. -continue Lipitor at 40mg  po qd. Increase dose if LDL is over 70 -frequent neuro checks per protocol. No need for NIHSS due to symptoms starting 4 days ago.  - Goal normotension for subacute stroke; avoid hypotension -PT/ST/OT -RN swallow study and if passes, can have diet. She has had laryngeal spasm in the past.  -risk factor modification.  -stroke education.  -stroke team to follow.    Pt seen by Clance Boll, NP/Neuro and later by MD. Note edited by MD to reflect attending findings and  recommendations.  Su Monks, MD [facility] [phone]  If 7pm- 7am, please page neurology on call as listed in [place].

## 2020-06-24 NOTE — ED Notes (Addendum)
Patient's daughters number [name] [phone], Lauro Regulus number [phone]

## 2020-06-24 NOTE — H&P (Signed)
History and Physical    [name]:[id number] DOB: [birth year] DOA: 06/23/2020  Referring MD/NP/PA: Ripley Fraise, MD PCP: Lauree Chandler, NP  Consultants: Donnella Bi, MD cardiology Patient coming from: Home (lives with son)  Chief Complaint: Confusion  I have personally briefly reviewed patient's old medical records in [place]   HPI: [name] is a 87 y.o. female with medical history significant of hypertension, hyperlipidemia, paroxysmal atrial fibrillation on anticoagulation, aortic stenosis, COPD, remote history of breast cancer status post resection, and PVD who presents with reports of confusion.  Most of history is obtained from the patient's son who is staying with her to help out.  At baseline patient is alert and oriented and able to complete her ADLs without assistance.  However, over the last 2 days the patient's son had noticed that she had been intermittently confused.  She could not remember how to work the remote control, tried using the house phone to change the TV channels, and could not remember if she had taken her medications.  He reports that normally she has a set routine, but has not been following it.  Her movements were very slowed, she was not eating or drinking, and was sleeping all day.  Patient states that she is fine.  She admits to having an intermittent cough that is chronic and has recently had no appetite.  Denies any shortness of breath, abdominal pain   ED Course: Upon admission into the emergency department patient was seen to be afebrile with blood pressure 168/85-206/88, and all other vital signs maintained.  CT scan of the brain without contrast showed no acute abnormalities and mild diffuse brain atrophy.  Labs are significant for CBC within normal limits, BUN 17, creatinine 1.14, glucose 162, alcohol level undetectable, APTT 37, and INR 1.3.  UDS negative.  Urinalysis was significant for large leukocytes, 11-20 WBCs, and no  bacteria seen.  MRI of the brain was obtained and revealed an acute to subacute infarct of the left thalamus and a chronic lacunar infarct of the contralateral right thalamus that is new since January of last year and several subacute to early chronic small bilateral white matter infarcts.  Neurology have been formally consulted.  She has been given acetaminophen and Rocephin 1 g IV.  COVID-19 screening was pending [facility] called to admit.  Review of Systems  Constitutional: Positive for malaise/fatigue. Negative for fever.  HENT: Positive for hearing loss. Negative for ear discharge and nosebleeds.   Eyes: Negative for photophobia and pain.  Respiratory: Positive for cough. Negative for sputum production and shortness of breath.   Cardiovascular: Negative for chest pain and leg swelling.  Gastrointestinal: Negative for abdominal pain, nausea and vomiting.  Genitourinary: Negative for dysuria and hematuria.  Musculoskeletal: Negative for falls.  Neurological: Positive for weakness. Negative for loss of consciousness.  Psychiatric/Behavioral: Negative for substance abuse.    Past Medical History:  Diagnosis Date  . Aortic stenosis 07/20/2012   Aortic stenosis   . Aortic valve disorder 08/13/2011   ECHO - EF >81%; mild diastolic dysfunction; calcified aortic valve, not well visualized; mod/severe aortic stenosis w/ worsening gradients when compared to 2012  . Arthritis   . Cancer ([place])    Breast  . Carotid bruit 08/06/2008   Doppler - R ECA demonstrates noarrowing w/ elevated velocities consistent w/ >70% diameter reduction; R and L ICAs show no evidence of diameter reduction, significant tortuosity or vascular abnormality;   . Change in voice   .  COPD (chronic obstructive pulmonary disease) ([place])   . Hearing loss   . Heart murmur   . Hyperlipidemia   . Hypertension    dr Gwenlyn Found  . Osteoporosis   . PAF (paroxysmal atrial fibrillation) ([place]) 09/04/2012  . PVD (peripheral vascular disease) ([place])  08/13/2010   R/P MV - normal pattern of perfusion in all regions, EF 76%; no significant wall abnormalities noted; normal perfusion study  . Right carotid bruit    high-grade right external carotid artery stenosis by 2 parts ultrasound 2 years ago  . S/P aortic valve replacement with bioprosthetic valve 08/30/2012   21 mm [facility] Ease bovine pericardial tissue valve    Past Surgical History:  Procedure Laterality Date  . ABDOMINAL HYSTERECTOMY     Partial  . AORTIC VALVE REPLACEMENT N/A 08/30/2012   Procedure: AORTIC VALVE REPLACEMENT (AVR);  Surgeon: Rexene Alberts, MD;  Location: [facility];  Service: Open Heart Surgery;  Laterality: N/A;  . BIOPSY SHOULDER Left 10/08/2005   shave biopsy  . BREAST LUMPECTOMY  02/25/1997   right  . CARDIAC CATHETERIZATION    . [place]  . COSMETIC SURGERY Left 1997   Breast implant  . EYE SURGERY  1997   Cataract surgery  . INTRAOPERATIVE TRANSESOPHAGEAL ECHOCARDIOGRAM N/A 08/30/2012   Procedure: INTRAOPERATIVE TRANSESOPHAGEAL ECHOCARDIOGRAM;  Surgeon: Rexene Alberts, MD;  Location: [facility];  Service: Open Heart Surgery;  Laterality: N/A;  . LEFT AND RIGHT HEART CATHETERIZATION WITH CORONARY ANGIOGRAM N/A 08/14/2012   Procedure: LEFT AND RIGHT HEART CATHETERIZATION WITH CORONARY ANGIOGRAM;  Surgeon: Lorretta Harp, MD;  Location: [facility] CATH LAB;  Service: Cardiovascular;  Laterality: N/A;  . LEFT HEART CATH  08/14/12   Nl cors, AS  . MASTECTOMY  09/29/95   left  . PARTIAL HYSTERECTOMY    . TOTAL HIP ARTHROPLASTY  09/2010     reports that she has never smoked. She has never used smokeless tobacco. She reports that she does not drink alcohol and does not use drugs.  Allergies  Allergen Reactions  . Lisinopril Cough  . Codeine Rash    All over the body.    Family History  Problem Relation Age of Onset  . Cancer Mother        Bladder  . Early death Father        Tractor accident  . Early death Brother        Radiation protection practitioner  . Early death  Brother        during heart surgery    Prior to Admission medications   Medication Sig Start Date End Date Taking? Authorizing Provider  cephALEXin (KEFLEX) 500 MG capsule Take 1 capsule (500 mg total) by mouth 2 (two) times daily. 06/24/20  Yes Ripley Fraise, MD  acetaminophen (TYLENOL) 325 MG tablet Take 650 mg by mouth at bedtime as needed.     [provider]  acetaminophen (TYLENOL) 650 MG CR tablet Take 650 mg by mouth. every morning    [provider]  albuterol (PROAIR HFA) 108 (90 Base) MCG/ACT inhaler INHALE 1 PUFF INTO THE LUNGS EVERY 6 (SIX) HOURS AS NEEDED FOR WHEEZING OR SHORTNESS OF BREATH. 07/26/17   Lauree Chandler, NP  amoxicillin (AMOXIL) 500 MG tablet Take 4 by mouth 1 hour prior to dental procedure    [provider]  apixaban (ELIQUIS) 5 MG TABS tablet TAKE 1 TABLET BY MOUTH TWICE DAILY. 05/07/20 05/07/21  Lorretta Harp, MD  Ascorbic Acid (  VITAMIN C) 100 MG tablet Take 100 mg by mouth daily.    [provider]  Calcium Citrate-Vitamin D (CITRACAL + D PO) Take 1 tablet by mouth 2 (two) times daily.     [provider]  Chlorpheniramine Maleate (EQ CHLORTABS PO) Take 1 tablet by mouth every 4 (four) hours as needed.    [provider]  dextromethorphan-guaiFENesin (MUCINEX DM) 30-600 MG 12hr tablet Take 1 tablet by mouth 2 (two) times daily as needed.     [provider]  famotidine (PEPCID) 20 MG tablet One after supper 08/23/18   Tanda Rockers, MD  fluticasone (FLONASE) 50 MCG/ACT nasal spray PLACE 1 SPRAY INTO BOTH NOSTRILS TWICE DAILY AS NEEDED FOR ALLERGIES OR RHINITIS. 04/02/20 04/02/21  Lauree Chandler, NP  hydrOXYzine (ATARAX/VISTARIL) 25 MG tablet Take one tablet by mouth daily as needed for itching. 01/03/19   Lauree Chandler, NP  Magnesium Hydroxide (PHILLIPS MILK OF MAGNESIA PO) Take 15 mL by mouth. Take daily at 6 o'clock at night    [provider]  metoprolol tartrate (LOPRESSOR) 25 MG  tablet TAKE 1 TABLET (25 MG TOTAL) BY MOUTH 2 (TWO) TIMES DAILY. 02/25/20   Lorretta Harp, MD  metoprolol tartrate (LOPRESSOR) 25 MG tablet TAKE 1 TABLET (25 MG TOTAL) BY MOUTH 2 (TWO) TIMES DAILY. 02/20/20 02/19/21  Lorretta Harp, MD  mirabegron ER (MYRBETRIQ) 50 MG TB24 tablet Take 1 tablet (50 mg total) by mouth daily. 05/14/19   Lauree Chandler, NP  mirabegron ER (MYRBETRIQ) 50 MG TB24 tablet TAKE 1 TABLET BY MOUTH ONCE DAILY. 09/10/19 09/09/20  Bjorn Loser, MD  Omega-3 Fatty Acids (FISH OIL PO) Take 1 capsule by mouth daily.    [provider]  omeprazole (PRILOSEC) 20 MG capsule Take 20 mg by mouth daily.    [provider]  shark liver oil-cocoa butter (PREPARATION H) 0.25-3-85.5 % suppository Place 1 suppository rectally as needed.    [provider]  Specialty Vitamins Products (ADVANCED COLLAGEN PO) Take 1,000 mg by mouth daily.    [provider]  valsartan (DIOVAN) 160 MG tablet TAKE 1 TABLET (160 MG TOTAL) BY MOUTH IN THE MORNING. 03/12/20 03/12/21  Lorretta Harp, MD  valsartan (DIOVAN) 80 MG tablet TAKE 1 TABLET (80 MG TOTAL) BY MOUTH EVERY EVENING. 03/12/20 03/12/21  Lorretta Harp, MD  vitamin E 400 UNIT capsule Take 400 Units by mouth daily.    [provider]    Physical Exam:  Constitutional: Elderly female currently in no acute distress Vitals:   06/24/20 0330 06/24/20 0430 06/24/20 0515 06/24/20 0715  BP: (!) 168/85 (!) 179/88 (!) 206/88 (!) 178/92  Pulse: 67 67 77 77  Resp: 20 17 20 20   Temp:      SpO2: 98% 97% 100% 99%  Weight:      Height:       Eyes: PERRL, lids and conjunctivae normal ENMT: Mucous membranes are moist. Posterior pharynx clear of any exudate or lesions.  Hard of hearing. Neck: normal, supple, no masses, no thyromegaly Respiratory: clear to auscultation bilaterally, no wheezing, no crackles. Normal respiratory effort. No accessory muscle use.  Cardiovascular: Irregularly irregular, no  murmurs / rubs / gallops. No extremity edema. 2+ pedal pulses. No carotid bruits.  Abdomen: no tenderness, no masses palpated. No hepatosplenomegaly. Bowel sounds positive.  Musculoskeletal: no clubbing / cyanosis.  Decreased range of motion of the right shoulder and right leg(reports recently pulling a muscle while getting on  the hospital gurney) Skin: no rashes, lesions, ulcers. No induration Neurologic: CN 2-12 grossly intact. Sensation intact, DTR normal. Strength 4+/5 in all 4.  Speech is somewhat slowed.  Slowed finger-to-nose. Psychiatric: Normal judgment and insight. Alert and oriented x 3. Normal mood.     Labs on Admission: I have personally reviewed following labs and imaging studies  CBC: Recent Labs  Lab 06/23/20 1933 06/23/20 1945  WBC 6.5  --   NEUTROABS 4.2  --   HGB 13.3 13.6  HCT 40.2 40.0  MCV 100.0  --   PLT 160  --    Basic Metabolic Panel: Recent Labs  Lab 06/23/20 1933 06/23/20 1945  NA 136 139  K 3.8 3.8  CL 100 100  CO2 27  --   GLUCOSE 162* 160*  BUN 17 19  CREATININE 1.14* 1.10*  CALCIUM 9.6  --    GFR: Estimated Creatinine Clearance: 35.2 mL/min (A) (by C-G formula based on SCr of 1.1 mg/dL (H)). Liver Function Tests: Recent Labs  Lab 06/23/20 1933  AST 22  ALT 19  ALKPHOS 55  BILITOT 0.8  PROT 6.3*  ALBUMIN 3.8   No results for input(s): LIPASE, AMYLASE in the last 168 hours. No results for input(s): AMMONIA in the last 168 hours. Coagulation Profile: Recent Labs  Lab 06/23/20 1933  INR 1.3*   Cardiac Enzymes: No results for input(s): CKTOTAL, CKMB, CKMBINDEX, TROPONINI in the last 168 hours. BNP (last 3 results) No results for input(s): PROBNP in the last 8760 hours. HbA1C: No results for input(s): HGBA1C in the last 72 hours. CBG: No results for input(s): GLUCAP in the last 168 hours. Lipid Profile: No results for input(s): CHOL, HDL, LDLCALC, TRIG, CHOLHDL, LDLDIRECT in the last 72 hours. Thyroid Function Tests: No  results for input(s): TSH, T4TOTAL, FREET4, T3FREE, THYROIDAB in the last 72 hours. Anemia Panel: No results for input(s): VITAMINB12, FOLATE, FERRITIN, TIBC, IRON, RETICCTPCT in the last 72 hours. Urine analysis:    Component Value Date/Time   COLORURINE YELLOW 06/23/2020 1923   APPEARANCEUR CLEAR 06/23/2020 1923   LABSPEC 1.011 06/23/2020 [place] 7.0 06/23/2020 1923   GLUCOSEU NEGATIVE 06/23/2020 [place] NEGATIVE 06/23/2020 [place] NEGATIVE 06/23/2020 [place] NEGATIVE 06/23/2020 1923   PROTEINUR NEGATIVE 06/23/2020 1923   UROBILINOGEN 0.2 08/28/2012 1523   NITRITE NEGATIVE 06/23/2020 1923   LEUKOCYTESUR LARGE (A) 06/23/2020 1923   Sepsis Labs: No results found for this or any previous visit (from the past 240 hour(s)).   Radiological Exams on Admission: CT HEAD WO CONTRAST  Result Date: 06/23/2020 CLINICAL DATA:  Weakness worse on the left side. Headache. Symptoms began yesterday. EXAM: CT HEAD WITHOUT CONTRAST TECHNIQUE: Contiguous axial images were obtained from the base of the skull through the vertex without intravenous contrast. COMPARISON:  MRI brain 03/13/2019 FINDINGS: Brain: No evidence of acute infarction, hemorrhage, hydrocephalus, extra-axial collection or mass lesion/mass effect. Mild diffuse cerebral atrophy. Low-attenuation changes in the deep white matter suggesting small vessel ischemia. Vascular: Mild intracranial arterial vascular calcifications. Skull: The calvarium appears intact. Sinuses/Orbits: Paranasal sinuses and mastoid air cells are clear. Other: None. IMPRESSION: 1. No acute intracranial abnormality. 2. Mild diffuse cerebral atrophy and small vessel ischemia. Electronically Signed   By: Lucienne Capers M.D.   On: 06/23/2020 19:57   MR BRAIN WO CONTRAST  Result Date: 06/24/2020 CLINICAL DATA:  87 year old female with recent onset weakness worse on the left side. Headache. EXAM: MRI HEAD WITHOUT  CONTRAST TECHNIQUE:  Multiplanar, multiecho pulse sequences of the brain and surrounding structures were obtained without intravenous contrast. COMPARISON:  Head CT 06/23/2020.  Brain MRI 03/13/2019. FINDINGS: Brain: 11 mm area of abnormal diffusion in the anterior left thalamus tracking medially, associated T2 and FLAIR hyperintensity, T1 hypointensity. No associated hemorrhage. No significant mass effect. Contralateral medial right thalamic chronic lacunar infarct is also new from last year but demonstrates cystic encephalomalacia on DWI. Additionally there are several small areas of bilateral white matter T2 shine through on DWI affecting the right corona radiata (series 5, image 76) and left centrum semiovale in several places. Underlying Patchy and confluent bilateral white matter T2 and FLAIR hyperintensity appears not significantly changed from last year, as are occasional chronic micro hemorrhages in the bilateral white matter. Mild T2 heterogeneity in the pons and chronic appearing gliosis in the left cerebellar peduncle appear stable from last year. Basal ganglia remain within normal limits. No cortical encephalomalacia identified. No restricted midline shift, mass effect, evidence of mass lesion, ventriculomegaly, extra-axial collection or acute intracranial hemorrhage. Cervicomedullary junction and pituitary are within normal limits. Vascular: Major intracranial vascular flow voids are stable since last year. Skull and upper cervical spine: Stable visible cervical spine. Visualized bone marrow signal is within normal limits. Sinuses/Orbits: Stable and negative. Other: Mastoids remain well aerated. IMPRESSION: 1. Acute to subacute infarct in the left thalamus. No associated hemorrhage or mass effect. 2. A chronic lacunar infarct in the contralateral right thalamus is new since January last year. And there are several subacute to early chronic small bilateral white matter infarcts superimposed on advanced chronic white matter  disease. Electronically Signed   By: Genevie Ann M.D.   On: 06/24/2020 07:16    EKG: Independently reviewed.  Atrial fibrillation at 65 bpm with left axis deviation.  Assessment/Plan CVA: Acute/subacute.  Patient presents with son noted 2 days of confusion, increased lethargy, poor appetite, and slowed movement.  MRI of the brain significant for a acute/subacute infarct of the left thalamus.  She was not a candidate for tPA due to her being on anticoagulation nor was she in the window. -Admit to a telemetry bed -Stroke order set initiated -Neuro checks -Check Hemoglobin A1c and lipid panel  -Check CTA head head and neck -Check echocardiogram -PT/OT/Speech to evaluate and treat -Atorvastatin -Appreciate neurology consultative services, will follow-up for any additional recommendations  Acute metabolic encephalopathy apraxia: Acute.  Patient reportedly has been intermittently confused and unable to to remember how to use things like the TV remote.  Question if symptoms are secondary to CVA and/or possible infection. -Continue to monitor  Abnormal urinalysis: Acute.  Patient was noted to have large leukocytes with 11-20 WBCs, but no bacteria were appreciated on urinalysis.  She had initially been given Rocephin IV. -Check urine culture -Continue Rocephin IV  Hypertensive urgency: Acute.  On admission patient blood pressures elevated up to 206/88.  Home blood pressure medications include metoprolol and Diovan. -Will defer to neurology as to whether to allow for permissive hypertension  Paroxysmal atrial fibrillation on chronic anticoagulation: Patient noted to be in atrial fibrillation but appears rate controlled.CHA2DS2-VASc score = at least 7(based on age, sex, HTN, CVA, PVD). -Continue Eliquis  Aortic valve stenosis s/p AVR: Status post bioprosthetic aortic valve replacement by Dr. Alvan Dame on 08/30/2012.  Hyperlipidemia -Follow-up lipid panel -Atorvastatin 40 mg daily due to age  Chronic  kidney disease stage IIIa: Patient son notes that she has not been eating and drinking.  She presents with  creatinine of 1.14 and BUN 17.  Her baseline creatinine appears to be around 1-1.1.  -Normal saline IV fluids at 75 mL/h x 1 L -Continue to monitor kidney plan  COPD: Patient with reports of intermittent dry cough.  Denies moderate obstructive airways disease and possible restriction based off last PFTs by Dr. Melvyn Novas in 12/2018. -Albuterol nebs as needed for shortness of breath/wheezing  History of breast cancer: Patient with history of right-sided breast cancer status post right lumpectomy he had in 1998.  DVT prophylaxis: Eliquis Code Status: Full Family Communication: Son updated at bedside Disposition Plan: To be determined Consults called: Neurology Admission status: Inpatient, require more than 2 midnight stay due to acute stroke  Norval Morton MD [facility]   If 7PM-7AM, please contact night-coverage   06/24/2020, 7:36 AM

## 2020-06-24 NOTE — ED Provider Notes (Signed)
[facility] EMERGENCY DEPARTMENT Provider Note   CSN: [id number] Arrival date & time: 06/23/20  1906     History Chief Complaint  Patient presents with  . Headache  . Weakness    [name] is a 87 y.o. female.  The history is provided by the patient.  Weakness Severity:  Moderate Onset quality:  Gradual Progression:  Waxing and waning Chronicity:  New Relieved by:  Nothing Worsened by:  Nothing Associated symptoms: no dysuria and no fever   Patient with history of aortic stenosis, paroxysmal atrial fibrillation on anticoagulation, hypertension, hyperlipidemia, laryngeal spasm and COPD presents with generalized weakness. Son is at bedside and he provides most of the history. Over the past 3 days patient has had intermittent episodes of confusion, forgetting how to use the remote and forgetting what foods she is eating.  She is also mention headache and having generalized weakness.  No falls, no new medications. Son reports that she is "moving slower than normal" Patient denies any recent fevers or chills.  No dysuria or abdominal pain is reported    Past Medical History:  Diagnosis Date  . Aortic stenosis 07/20/2012   Aortic stenosis   . Aortic valve disorder 08/13/2011   ECHO - EF >25%; mild diastolic dysfunction; calcified aortic valve, not well visualized; mod/severe aortic stenosis w/ worsening gradients when compared to 2012  . Arthritis   . Cancer ([place])    Breast  . Carotid bruit 08/06/2008   Doppler - R ECA demonstrates noarrowing w/ elevated velocities consistent w/ >70% diameter reduction; R and L ICAs show no evidence of diameter reduction, significant tortuosity or vascular abnormality;   . Change in voice   . COPD (chronic obstructive pulmonary disease) ([place])   . Hearing loss   . Heart murmur   . Hyperlipidemia   . Hypertension    dr Gwenlyn Found  . Osteoporosis   . PAF (paroxysmal atrial fibrillation) ([place]) 09/04/2012  . PVD (peripheral  vascular disease) ([place]) 08/13/2010   R/P MV - normal pattern of perfusion in all regions, EF 76%; no significant wall abnormalities noted; normal perfusion study  . Right carotid bruit    high-grade right external carotid artery stenosis by 2 parts ultrasound 2 years ago  . S/P aortic valve replacement with bioprosthetic valve 08/30/2012   21 mm [facility] Ease bovine pericardial tissue valve    Patient Active Problem List   Diagnosis Date Noted  . Cough variant asthma 01/22/2019  . Upper airway cough syndrome 08/23/2018  . Chronic right shoulder pain 06/15/2017  . Shoulder arthritis 06/15/2017  . CKD (chronic kidney disease) stage 3, GFR 30-59 ml/min (HCC) 09/08/2016  . Carotid stenosis 05/05/2016  . Injury of left ankle 03/11/2016  . Elbow pain, right 10/10/2013  . Cough 09/12/2013  . COPD exacerbation ([place]) 05/16/2013  . Acute bronchitis 04/11/2013  . 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  . Long term (current) use of anticoagulants 10/02/2012  . Overactive bladder 09/11/2012  . Acute blood loss anemia 09/11/2012  . Weakness generalized 09/11/2012  . Atrial fibrillation ([place]) 09/04/2012  . S/P aortic valve replacement with bioprosthetic valve 08/30/2012  . Aortic valve stenosis, critical 08/28/2012  . Normal coronary arteries-6/14 08/15/2012  . DOE (dyspnea on exertion) 08/15/2012  . Chest pain-exertional 08/15/2012  . COPD (chronic obstructive pulmonary disease)- noted on CXR 08/08/12 08/15/2012  . Aortic stenosis- critical AS at cath 08/14/12, with surgery 07/20/2012  . History of total hip arthroplasty 06/29/2012  . Unspecified constipation 06/06/2012  .  Insomnia 06/06/2012  . Essential hypertension 06/06/2012  . Osteoporosis 06/06/2012  . GERD (gastroesophageal reflux disease) 06/06/2012  . Laryngeal spasm 12/25/2010  . Hx Left Breast cancer, DCIS 09/29/1995    Past Surgical History:  Procedure Laterality Date  . ABDOMINAL HYSTERECTOMY     Partial  . AORTIC VALVE  REPLACEMENT N/A 08/30/2012   Procedure: AORTIC VALVE REPLACEMENT (AVR);  Surgeon: Rexene Alberts, MD;  Location: [place];  Service: Open Heart Surgery;  Laterality: N/A;  . BIOPSY SHOULDER Left 10/08/2005   shave biopsy  . BREAST LUMPECTOMY  02/25/1997   right  . CARDIAC CATHETERIZATION    . [place]  . COSMETIC SURGERY Left 1997   Breast implant  . EYE SURGERY  1997   Cataract surgery  . INTRAOPERATIVE TRANSESOPHAGEAL ECHOCARDIOGRAM N/A 08/30/2012   Procedure: INTRAOPERATIVE TRANSESOPHAGEAL ECHOCARDIOGRAM;  Surgeon: Rexene Alberts, MD;  Location: [place];  Service: Open Heart Surgery;  Laterality: N/A;  . LEFT AND RIGHT HEART CATHETERIZATION WITH CORONARY ANGIOGRAM N/A 08/14/2012   Procedure: LEFT AND RIGHT HEART CATHETERIZATION WITH CORONARY ANGIOGRAM;  Surgeon: Lorretta Harp, MD;  Location: [facility] C[facility] LAB;  Service: Cardiovascular;  Laterality: N/A;  . LEFT HEART CATH  08/14/12   Nl cors, AS  . MASTECTOMY  09/29/95   left  . PARTIAL HYSTERECTOMY    . TOTAL HIP ARTHROPLASTY  09/2010     OB History   No obstetric history on file.     Family History  Problem Relation Age of Onset  . Cancer Mother        Bladder  . Early death Father        Tractor accident  . Early death Brother        Radiation protection practitioner  . Early death Brother        during heart surgery    Social History   Tobacco Use  . Smoking status: Never Smoker  . Smokeless tobacco: Never Used  Vaping Use  . Vaping Use: Never used  Substance Use Topics  . Alcohol use: No  . Drug use: No    Home Medications Prior to Admission medications   Medication Sig Start Date End Date Taking? Authorizing Provider  acetaminophen (TYLENOL) 325 MG tablet Take 650 mg by mouth at bedtime as needed.     [provider]  acetaminophen (TYLENOL) 650 MG CR tablet Take 650 mg by mouth. every morning    [provider]  albuterol (PROAIR HFA) 108 (90 Base) MCG/ACT inhaler INHALE 1 PUFF INTO THE LUNGS EVERY 6 (SIX)  HOURS AS NEEDED FOR WHEEZING OR SHORTNESS OF BREATH. 07/26/17   Lauree Chandler, NP  amoxicillin (AMOXIL) 500 MG tablet Take 4 by mouth 1 hour prior to dental procedure    [provider]  apixaban (ELIQUIS) 5 MG TABS tablet TAKE 1 TABLET BY MOUTH TWICE DAILY. 05/07/20 05/07/21  Lorretta Harp, MD  Ascorbic Acid (VITAMIN C) 100 MG tablet Take 100 mg by mouth daily.    [provider]  Calcium Citrate-Vitamin D (CITRACAL + D PO) Take 1 tablet by mouth 2 (two) times daily.     [provider]  Chlorpheniramine Maleate (EQ CHLORTABS PO) Take 1 tablet by mouth every 4 (four) hours as needed.    [provider]  dextromethorphan-guaiFENesin (MUCINEX DM) 30-600 MG 12hr tablet Take 1 tablet by mouth 2 (two) times daily as needed.     [provider]  famotidine (PEPCID) 20 MG  tablet One after supper 08/23/18   Tanda Rockers, MD  fluticasone [facility]) 50 MCG/ACT nasal spray PLACE 1 SPRAY INTO BOTH NOSTRILS TWICE DAILY AS NEEDED FOR ALLERGIES OR RHINITIS. 04/02/20 04/02/21  Lauree Chandler, NP  hydrOXYzine (ATARAX/VISTARIL) 25 MG tablet Take one tablet by mouth daily as needed for itching. 01/03/19   Lauree Chandler, NP  Magnesium Hydroxide (PHILLIPS MILK OF MAGNESIA PO) Take 15 mL by mouth. Take daily at 6 o'clock at night    [provider]  metoprolol tartrate (LOPRESSOR) 25 MG tablet TAKE 1 TABLET (25 MG TOTAL) BY MOUTH 2 (TWO) TIMES DAILY. 02/25/20   Lorretta Harp, MD  metoprolol tartrate (LOPRESSOR) 25 MG tablet TAKE 1 TABLET (25 MG TOTAL) BY MOUTH 2 (TWO) TIMES DAILY. 02/20/20 02/19/21  Lorretta Harp, MD  mirabegron ER (MYRBETRIQ) 50 MG TB24 tablet Take 1 tablet (50 mg total) by mouth daily. 05/14/19   Lauree Chandler, NP  mirabegron ER (MYRBETRIQ) 50 MG TB24 tablet TAKE 1 TABLET BY MOUTH ONCE DAILY. 09/10/19 09/09/20  Bjorn Loser, MD  Omega-3 Fatty Acids (FISH OIL PO) Take 1 capsule by mouth daily.    [provider]   omeprazole (PRILOSEC) 20 MG capsule Take 20 mg by mouth daily.    [provider]  shark liver oil-cocoa butter (PREPARATION H) 0.25-3-85.5 % suppository Place 1 suppository rectally as needed.    [provider]  Specialty Vitamins Products (ADVANCED COLLAGEN PO) Take 1,000 mg by mouth daily.    [provider]  valsartan (DIOVAN) 160 MG tablet TAKE 1 TABLET (160 MG TOTAL) BY MOUTH IN THE MORNING. 03/12/20 03/12/21  Lorretta Harp, MD  valsartan (DIOVAN) 80 MG tablet TAKE 1 TABLET (80 MG TOTAL) BY MOUTH EVERY EVENING. 03/12/20 03/12/21  Lorretta Harp, MD  vitamin E 400 UNIT capsule Take 400 Units by mouth daily.    [provider]    Allergies    Lisinopril and Codeine  Review of Systems   Review of Systems  Constitutional: Negative for fever.  Genitourinary: Negative for dysuria.  Neurological: Positive for weakness.  All other systems reviewed and are negative.   Physical Exam Updated Vital Signs BP (!) 198/103   Pulse 70   Temp 98.3 F (36.8 C)   Resp 19   Ht 1.626 m (5\' 4" )   Wt 72.6 kg   SpO2 98%   BMI 27.46 kg/m   Physical Exam  CONSTITUTIONAL: elderly, no distress HEAD: Normocephalic/atraumatic EYES: EOMI/PERRL ENMT: Mucous membranes moist NECK: supple no meningeal signs SPINE/BACK:entire spine nontender CV: irregular LUNGS: Lungs are clear to auscultation bilaterally, no apparent distress ABDOMEN: soft, nontender, no rebound or guarding, bowel sounds noted throughout abdomen GU:no cva tenderness NEURO: Pt is awake/alert/appropriate, moves all extremitiesx4.  No facial droop.  No arm or leg drift is noted.  Patient is slow to respond at times EXTREMITIES: pulses normal/equal, full ROM,All other extremities/joints palpated/ranged and nontender SKIN: warm, color normal PSYCH: no abnormalities of mood noted, alert and oriented to situation  ED Results / Procedures / Treatments   Labs (all labs ordered are listed, but only  abnormal results are displayed) Labs Reviewed  PROTIME-INR - Abnormal; Notable for the following components:      Result Value   Prothrombin Time 16.0 (*)    INR 1.3 (*)    All other components within normal limits  APTT - Abnormal; Notable for the following components:   aPTT 37 (*)    All  other components within normal limits  COMPREHENSIVE METABOLIC PANEL - Abnormal; Notable for the following components:   Glucose, Bld 162 (*)    Creatinine, Ser 1.14 (*)    Total Protein 6.3 (*)    GFR, Estimated 47 (*)    All other components within normal limits  URINALYSIS, ROUTINE W REFLEX MICROSCOPIC - Abnormal; Notable for the following components:   Leukocytes,Ua LARGE (*)    All other components within normal limits  I-STAT CHEM 8, ED - Abnormal; Notable for the following components:   Creatinine, Ser 1.10 (*)    Glucose, Bld 160 (*)    All other components within normal limits  SARS CORONAVIRUS 2 (TAT 6-24 HRS)  ETHANOL  CBC  DIFFERENTIAL  RAPID URINE DRUG SCREEN, HOSP PERFORMED    EKG EKG Interpretation  Date/Time:  Tuesday June 24 2020 02:57:32 EDT Ventricular Rate:  65 PR Interval:    QRS Duration: 96 QT Interval:  446 QTC Calculation: 464 R Axis:   -53 Text Interpretation: Atrial fibrillation Left axis deviation Probable anteroseptal infarct, old Confirmed by Ripley Fraise [phone]) on 06/24/2020 3:21:00 AM   Radiology CT HEAD WO CONTRAST  Result Date: 06/23/2020 CLINICAL DATA:  Weakness worse on the left side. Headache. Symptoms began yesterday. EXAM: CT HEAD WITHOUT CONTRAST TECHNIQUE: Contiguous axial images were obtained from the base of the skull through the vertex without intravenous contrast. COMPARISON:  MRI brain 03/13/2019 FINDINGS: Brain: No evidence of acute infarction, hemorrhage, hydrocephalus, extra-axial collection or mass lesion/mass effect. Mild diffuse cerebral atrophy. Low-attenuation changes in the deep white matter suggesting small vessel ischemia.  Vascular: Mild intracranial arterial vascular calcifications. Skull: The calvarium appears intact. Sinuses/Orbits: Paranasal sinuses and mastoid air cells are clear. Other: None. IMPRESSION: 1. No acute intracranial abnormality. 2. Mild diffuse cerebral atrophy and small vessel ischemia. Electronically Signed   By: Lucienne Capers M.D.   On: 06/23/2020 19:57   MR BRAIN WO CONTRAST  Result Date: 06/24/2020 CLINICAL DATA:  87 year old female with recent onset weakness worse on the left side. Headache. EXAM: MRI HEAD WITHOUT CONTRAST TECHNIQUE: Multiplanar, multiecho pulse sequences of the brain and surrounding structures were obtained without intravenous contrast. COMPARISON:  Head CT 06/23/2020.  Brain MRI 03/13/2019. FINDINGS: Brain: 11 mm area of abnormal diffusion in the anterior left thalamus tracking medially, associated T2 and FLAIR hyperintensity, T1 hypointensity. No associated hemorrhage. No significant mass effect. Contralateral medial right thalamic chronic lacunar infarct is also new from last year but demonstrates cystic encephalomalacia on DWI. Additionally there are several small areas of bilateral white matter T2 shine through on DWI affecting the right corona radiata (series 5, image 76) and left centrum semiovale in several places. Underlying Patchy and confluent bilateral white matter T2 and FLAIR hyperintensity appears not significantly changed from last year, as are occasional chronic micro hemorrhages in the bilateral white matter. Mild T2 heterogeneity in the pons and chronic appearing gliosis in the left cerebellar peduncle appear stable from last year. Basal ganglia remain within normal limits. No cortical encephalomalacia identified. No restricted midline shift, mass effect, evidence of mass lesion, ventriculomegaly, extra-axial collection or acute intracranial hemorrhage. Cervicomedullary junction and pituitary are within normal limits. Vascular: Major intracranial vascular flow voids  are stable since last year. Skull and upper cervical spine: Stable visible cervical spine. Visualized bone marrow signal is within normal limits. Sinuses/Orbits: Stable and negative. Other: Mastoids remain well aerated. IMPRESSION: 1. Acute to subacute infarct in the left thalamus. No associated hemorrhage or mass effect. 2. A  chronic lacunar infarct in the contralateral right thalamus is new since January last year. And there are several subacute to early chronic small bilateral white matter infarcts superimposed on advanced chronic white matter disease. Electronically Signed   By: Genevie Ann M.D.   On: 06/24/2020 07:16    Procedures Procedures   Medications Ordered in ED Medications  cefTRIAXone (ROCEPHIN) 1 g in sodium chloride 0.9 % 100 mL IVPB (has no administration in time range)  acetaminophen (TYLENOL) tablet 650 mg (650 mg Oral Given 06/24/20 0501)    ED Course  I have reviewed the triage vital signs and the nursing notes.  Pertinent labs & imaging results that were available during my care of the patient were reviewed by me and considered in my medical decision making (see chart for details).    MDM Rules/Calculators/A&P                          3:41 AM Patient presents from home with her son for generalized weakness for the past 3 days.  Patient reports that he has not witnessed any unilateral weakness, but she has had intermittent episodes of confusion and "slow-moving" Overall patient is awake alert answering most questions appropriately, but she does appear to have some confusion here in the ED.  It is unclear if this is an acute issue or could be evidence of sundowning.  Son who is her primary caregiver is also a poor historian and is unclear exactly what her baseline is. She does have evidence of a UTI, but is unclear if that is causing all of her issues.  Patient was able to ambulate here with standby assistance. BP is elevated, though could be due to that she needs her  medication  Due to unclear cause of her AMS, I advised MRI brain.  She has been able to get MRIs in the past without difficulty If this is negative, patient can be discharged with treatment for UTI 7:39 AM MRI positive for thalamic stroke.  Patient be admitted to the hospitalist.  Discussed with Dr. Fuller Plan for admission.  I have also paged the neurology team  Final Clinical Impression(s) / ED Diagnoses Final diagnoses:  Cerebrovascular accident (CVA), unspecified mechanism ([place])  Acute cystitis without hematuria    Rx / DC Orders ED Discharge Orders    None       Ripley Fraise, MD 06/24/20 0740

## 2020-06-24 NOTE — Telephone Encounter (Signed)
Incoming call received from patients daughter stating her mother is in the hospital and was diagnosed with UTI and mini stroke. Kimi and her bother thought patient had a aneurysm. Kimi canceled acute appointment for Wednesday with Lauree Chandler, NP as patient will be hospitalized 48-72 hours. Kimi will call back to scheduled hospital follow-up once patient is released (if indicated).  Kimi asked that I share this message with Anita/CMA when she returns as Rodena Piety had been a family friend for years and she wanted to keep her in the loop.

## 2020-06-24 NOTE — ED Notes (Signed)
Patient transported to X-ray 

## 2020-06-24 NOTE — Progress Notes (Signed)
  Echocardiogram 2D Echocardiogram has been performed.  Jennette Dubin 06/24/2020, 3:27 PM

## 2020-06-24 NOTE — Telephone Encounter (Signed)
Thank you for the update!

## 2020-06-24 NOTE — Progress Notes (Signed)
Pt has been admitted to the unit via hospital bed. All equipment and belongings have been sent with the patient and placed in the closet if necessary. Pt has call light and telephone within reach.   06/24/20 1847  Vitals  Temp 97.8 F (36.6 C)  Temp Source Oral  BP (!) 179/94  MAP (mmHg) 118  BP Location Right Arm  BP Method Automatic  Patient Position (if appropriate) Lying  Pulse Rate 67  Pulse Rate Source Dinamap  Resp 18  Level of Consciousness  Level of Consciousness Alert  Oxygen Therapy  SpO2 98 %  O2 Device Room Air  Pain Assessment  Pain Scale 0-10  Pain Score 0

## 2020-06-24 NOTE — Evaluation (Signed)
Physical Therapy Evaluation Patient Details Name: [name] MRN: [id number] DOB: 05-27-1931 Today's Date: 06/24/2020   History of Present Illness  Pt is an 87 y/o female admitted secondary to increased confusion. PMH includes breast cancer, COPD, s/p AVR, CKD, a fib, HTN.  Clinical Impression  Pt admitted secondary to problem above with deficits below. Pt disoriented to situation and presenting with slow processing. Mild unsteadiness noted requiring min guard A for mobility tasks this session. Pt's son present and reports he or one of his siblings can provide 24/7 support if needed. Feel pt would benefit from HHPT at d/c. Will continue to follow acutely.     Follow Up Recommendations Home health PT;Supervision/Assistance - 24 hour    Equipment Recommendations  Rolling walker with 5" wheels    Recommendations for Other Services       Precautions / Restrictions Precautions Precautions: Fall Restrictions Weight Bearing Restrictions: No      Mobility  Bed Mobility Overal bed mobility: Needs Assistance Bed Mobility: Supine to Sit;Sit to Supine     Supine to sit: Min assist Sit to supine: Supervision   General bed mobility comments: Min A for trunk and LE assist to come to sitting on stretcher. Supervision for safety for return to supine.    Transfers Overall transfer level: Needs assistance Equipment used: None Transfers: Sit to/from Stand Sit to Stand: Min guard         General transfer comment: Min guard for safety.  Ambulation/Gait Ambulation/Gait assistance: Min guard Gait Distance (Feet): 30 Feet Assistive device: 1 person hand held assist Gait Pattern/deviations: Step-through pattern;Decreased stride length Gait velocity: Decreased   General Gait Details: Very short steps initially, however, step length increased with further distance. Mild unsteadiness noted. Min guard and HHA for support.  Stairs            Wheelchair Mobility    Modified  Rankin (Stroke Patients Only) Modified Rankin (Stroke Patients Only) Pre-Morbid Rankin Score: No significant disability Modified Rankin: Moderately severe disability     Balance Overall balance assessment: Needs assistance Sitting-balance support: No upper extremity supported;Feet supported Sitting balance-Leahy Scale: Fair     Standing balance support: Single extremity supported;During functional activity Standing balance-Leahy Scale: Poor Standing balance comment: Reliant on at least 1 UE support                             Pertinent Vitals/Pain Pain Assessment: No/denies pain    Home Living Family/patient expects to be discharged to:: Private residence Living Arrangements: Children Available Help at Discharge: Family;Available PRN/intermittently Type of Home: House Home Access: Stairs to enter Entrance Stairs-Rails: Chemical engineer of Steps: 13 Home Layout: One level Home Equipment: Cane - single point      Prior Function Level of Independence: Independent               Hand Dominance        Extremity/Trunk Assessment   Upper Extremity Assessment Upper Extremity Assessment: Defer to OT evaluation    Lower Extremity Assessment Lower Extremity Assessment: Generalized weakness    Cervical / Trunk Assessment Cervical / Trunk Assessment: Kyphotic  Communication   Communication: HOH  Cognition Arousal/Alertness: Awake/alert Behavior During Therapy: WFL for tasks assessed/performed Overall Cognitive Status: Impaired/Different from baseline Area of Impairment: Problem solving;Orientation                 Orientation Level: Disoriented to;Situation  Problem Solving: Slow processing General Comments: Reports she was "sick" as to why she came into hospital, but did not know what kind of sickness.      General Comments General comments (skin integrity, edema, etc.): pt's son present during session     Exercises     Assessment/Plan    PT Assessment Patient needs continued PT services  PT Problem List Decreased cognition;Decreased safety awareness;Decreased mobility;Decreased balance;Decreased strength       PT Treatment Interventions DME instruction;Gait training;Functional mobility training;Therapeutic activities;Stair training;Therapeutic exercise;Balance training;Patient/family education;Cognitive remediation    PT Goals (Current goals can be found in the Care Plan section)  Acute Rehab PT Goals Patient Stated Goal: to go home PT Goal Formulation: With patient Time For Goal Achievement: 07/08/20 Potential to Achieve Goals: Fair    Frequency Min 4X/week   Barriers to discharge        Co-evaluation               AM-PAC PT "6 Clicks" Mobility  Outcome Measure Help needed turning from your back to your side while in a flat bed without using bedrails?: A Little Help needed moving from lying on your back to sitting on the side of a flat bed without using bedrails?: A Little Help needed moving to and from a bed to a chair (including a wheelchair)?: A Little Help needed standing up from a chair using your arms (e.g., wheelchair or bedside chair)?: A Little Help needed to walk in hospital room?: A Little Help needed climbing 3-5 steps with a railing? : A Little 6 Click Score: 18    End of Session Equipment Utilized During Treatment: Gait belt Activity Tolerance: Patient tolerated treatment well Patient left: in bed;with call bell/phone within reach;with family/visitor present (on stretcher in ED) Nurse Communication: Mobility status PT Visit Diagnosis: Unsteadiness on feet (R26.81);Muscle weakness (generalized) (M62.81)    Time: 1937-9024 PT Time Calculation (min) (ACUTE ONLY): 20 min   Charges:   PT Evaluation $PT Eval Low Complexity: 1 Low          Lou Miner, DPT  Acute Rehabilitation Services  Pager: [phone] Office: [phone]   Rudean Hitt 06/24/2020, 1:38 PM

## 2020-06-24 NOTE — ED Notes (Signed)
Pt changed into hospital gown and placed on purewick. Pt educated on how to use purewick.

## 2020-06-24 NOTE — ED Notes (Signed)
Nurse report given to [place], [place]

## 2020-06-25 ENCOUNTER — Ambulatory Visit: Payer: Medicare Other | Admitting: Nurse Practitioner

## 2020-06-25 DIAGNOSIS — N1831 Chronic kidney disease, stage 3a: Secondary | ICD-10-CM | POA: Diagnosis not present

## 2020-06-25 DIAGNOSIS — N3000 Acute cystitis without hematuria: Secondary | ICD-10-CM | POA: Diagnosis not present

## 2020-06-25 DIAGNOSIS — I6300 Cerebral infarction due to thrombosis of unspecified precerebral artery: Secondary | ICD-10-CM | POA: Diagnosis not present

## 2020-06-25 DIAGNOSIS — I639 Cerebral infarction, unspecified: Secondary | ICD-10-CM | POA: Diagnosis not present

## 2020-06-25 DIAGNOSIS — I4811 Longstanding persistent atrial fibrillation: Secondary | ICD-10-CM | POA: Diagnosis not present

## 2020-06-25 LAB — BASIC METABOLIC PANEL
Anion gap: 10 (ref 5–15)
BUN: 13 mg/dL (ref 8–23)
CO2: 25 mmol/L (ref 22–32)
Calcium: 9.5 mg/dL (ref 8.9–10.3)
Chloride: 103 mmol/L (ref 98–111)
Creatinine, Ser: 0.95 mg/dL (ref 0.44–1.00)
GFR, Estimated: 58 mL/min — ABNORMAL LOW (ref >60)
Glucose, Bld: 96 mg/dL (ref 70–99)
Potassium: 3.8 mmol/L (ref 3.5–5.1)
Sodium: 138 mmol/L (ref 135–145)

## 2020-06-25 LAB — URINE CULTURE

## 2020-06-25 LAB — MAGNESIUM: Magnesium: 1.8 mg/dL (ref 1.7–2.4)

## 2020-06-25 MED ORDER — METOPROLOL TARTRATE 5 MG/5ML IV SOLN
2.5000 mg | Freq: Once | INTRAVENOUS | Status: AC
  Administered 2020-06-25: 2.5 mg via INTRAVENOUS
  Filled 2020-06-25: qty 5

## 2020-06-25 MED ORDER — METOPROLOL TARTRATE 25 MG PO TABS: 25.0000 mg | ORAL_TABLET | Freq: Two times a day (BID) | ORAL | Status: DC

## 2020-06-25 MED ORDER — APIXABAN 5 MG PO TABS
5.0000 mg | ORAL_TABLET | Freq: Two times a day (BID) | ORAL | Status: DC
  Administered 2020-06-25 – 2020-06-27 (×4): 5 mg via ORAL
  Filled 2020-06-25 (×5): qty 1

## 2020-06-25 MED ORDER — METOPROLOL TARTRATE 25 MG PO TABS
25.0000 mg | ORAL_TABLET | Freq: Two times a day (BID) | ORAL | Status: DC
  Administered 2020-06-25 – 2020-06-27 (×5): 25 mg via ORAL
  Filled 2020-06-25 (×5): qty 1

## 2020-06-25 MED ORDER — APIXABAN 5 MG PO TABS: 5.0000 mg | ORAL_TABLET | Freq: Two times a day (BID) | ORAL | Status: DC

## 2020-06-25 NOTE — Evaluation (Addendum)
Speech Language Pathology Evaluation Patient Details Name: [name] MRN: [id number] DOB: 09-03-1931 Today's Date: 06/25/2020 Time: 8657-8469 SLP Time Calculation (min) (ACUTE ONLY): 40 min  Problem List:  Patient Active Problem List   Diagnosis Date Noted  . CVA (cerebral vascular accident) ([place]) 06/24/2020  . Hypertensive urgency 06/24/2020  . Cough variant asthma 01/22/2019  . Upper airway cough syndrome 08/23/2018  . Chronic right shoulder pain 06/15/2017  . Shoulder arthritis 06/15/2017  . CKD (chronic kidney disease) stage 3, GFR 30-59 ml/min (HCC) 09/08/2016  . Carotid stenosis 05/05/2016  . Injury of left ankle 03/11/2016  . Elbow pain, right 10/10/2013  . Cough 09/12/2013  . COPD exacerbation ([place]) 05/16/2013  . Acute bronchitis 04/11/2013  . 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  . Long term (current) use of anticoagulants 10/02/2012  . Overactive bladder 09/11/2012  . Acute blood loss anemia 09/11/2012  . Weakness generalized 09/11/2012  . Atrial fibrillation ([place]) 09/04/2012  . S/P aortic valve replacement with bioprosthetic valve 08/30/2012  . Aortic valve stenosis, critical 08/28/2012  . Normal coronary arteries-6/14 08/15/2012  . DOE (dyspnea on exertion) 08/15/2012  . Chest pain-exertional 08/15/2012  . COPD (chronic obstructive pulmonary disease)- noted on CXR 08/08/12 08/15/2012  . Aortic stenosis- critical AS at cath 08/14/12, with surgery 07/20/2012  . History of total hip arthroplasty 06/29/2012  . Unspecified constipation 06/06/2012  . Insomnia 06/06/2012  . Essential hypertension 06/06/2012  . Osteoporosis 06/06/2012  . GERD (gastroesophageal reflux disease) 06/06/2012  . Laryngeal spasm 12/25/2010  . History of breast cancer 09/29/1995   Past Medical History:  Past Medical History:  Diagnosis Date  . Aortic stenosis 07/20/2012   Aortic stenosis   . Aortic valve disorder 08/13/2011   ECHO - EF >62%; mild diastolic dysfunction; calcified aortic valve,  not well visualized; mod/severe aortic stenosis w/ worsening gradients when compared to 2012  . Arthritis   . Cancer ([place])    Breast  . Carotid bruit 08/06/2008   Doppler - R ECA demonstrates noarrowing w/ elevated velocities consistent w/ >70% diameter reduction; R and L ICAs show no evidence of diameter reduction, significant tortuosity or vascular abnormality;   . Change in voice   . COPD (chronic obstructive pulmonary disease) ([place])   . Hearing loss   . Heart murmur   . Hyperlipidemia   . Hypertension    dr Gwenlyn Found  . Osteoporosis   . PAF (paroxysmal atrial fibrillation) ([place]) 09/04/2012  . PVD (peripheral vascular disease) ([place]) 08/13/2010   R/P MV - normal pattern of perfusion in all regions, EF 76%; no significant wall abnormalities noted; normal perfusion study  . Right carotid bruit    high-grade right external carotid artery stenosis by 2 parts ultrasound 2 years ago  . S/P aortic valve replacement with bioprosthetic valve 08/30/2012   21 mm [facility] Ease bovine pericardial tissue valve   Past Surgical History:  Past Surgical History:  Procedure Laterality Date  . ABDOMINAL HYSTERECTOMY     Partial  . AORTIC VALVE REPLACEMENT N/A 08/30/2012   Procedure: AORTIC VALVE REPLACEMENT (AVR);  Surgeon: Rexene Alberts, MD;  Location: [facility];  Service: Open Heart Surgery;  Laterality: N/A;  . BIOPSY SHOULDER Left 10/08/2005   shave biopsy  . BREAST LUMPECTOMY  02/25/1997   right  . CARDIAC CATHETERIZATION    . [place]  . COSMETIC SURGERY Left 1997   Breast implant  . EYE SURGERY  1997   Cataract surgery  . INTRAOPERATIVE TRANSESOPHAGEAL ECHOCARDIOGRAM N/A  08/30/2012   Procedure: INTRAOPERATIVE TRANSESOPHAGEAL ECHOCARDIOGRAM;  Surgeon: Rexene Alberts, MD;  Location: [facility];  Service: Open Heart Surgery;  Laterality: N/A;  . LEFT AND RIGHT HEART CATHETERIZATION WITH CORONARY ANGIOGRAM N/A 08/14/2012   Procedure: LEFT AND RIGHT HEART CATHETERIZATION WITH CORONARY ANGIOGRAM;   Surgeon: Lorretta Harp, MD;  Location[facility] C[facility] LAB;  Service: Cardiovascular;  Laterality: N/A;  . LEFT HEART CATH  08/14/12   Nl cors, AS  . MASTECTOMY  09/29/95   left  . PARTIAL HYSTERECTOMY    . TOTAL HIP ARTHROPLASTY  09/2010   HPI:  Pt is an 87 y/o female with PMH of breast cancer, COPD, s/p AVR, CKD, a fib, laryngeal spasms s/p botox, HTN, admitted secondary to increased confusion. Son has noted some red flags 2-3 days prior to admission, pt has been less oriented and unable to complete her ADLs without assistance which is unusual for her.  She could not remember how to work the remote control and whether she took her medications . MRI (4/26) revealing "Acute to subacute infarct in L thalamus. Chronic lacunar infarct in the contralateral R thalamus. Several subacute to early chronic small bilateral white matter infarcts superimposed on advanced chronic white matter disease".   Assessment / Plan / Recommendation Clinical Impression  Pt presents with cognitive and expressive/receptive language deficits c/b impaired orientation (oriented to self and partially to time/place), delayed recall memory (0%), auditory comprehension and word finding difficulties. Pt speech fluency frequently interrupted with brief periods of aphonia, resulting in impaired intelligibility, however per pt and son (present for session), this is baseline given her hx of laryngospasms. She was able to follow basic 1 step commands with 100% accuracy, but experienced increased difficulty with added steps. Basic-moderate level yes/no questions answered with 25-30% accuracy, which improved with repetition of question. Word finding difficulties manifested in complex naming tasks (responsive, divergent) with pt exhibiting perseveration, phonemic and semantic paraphasias throughout all structured tasks and informal conversation. SLP services recommended to f/u to address basic cognitive and languge skills.    SLP Assessment  SLP  Recommendation/Assessment: Patient needs continued Speech Lanaguage Pathology Services SLP Visit Diagnosis: Cognitive communication deficit (R41.841);Aphasia (R47.01)    Follow Up Recommendations  Home health SLP;24 hour supervision/assistance    Frequency and Duration min 2x/week  2 weeks      SLP Evaluation Cognition  Overall Cognitive Status: Impaired/Different from baseline Arousal/Alertness: Awake/alert Orientation Level: Oriented to person;Disoriented to place;Disoriented to time;Disoriented to situation Attention: Sustained Sustained Attention: Appears intact Memory: Impaired Memory Impairment: Retrieval deficit;Decreased recall of new information Immediate Memory Recall:  (100%) Awareness: Impaired Awareness Impairment: Intellectual impairment       Comprehension  Auditory Comprehension Overall Auditory Comprehension: Impaired Yes/No Questions: Impaired Basic Immediate Environment Questions: 25-49% accurate Complex Questions: 25-49% accurate Commands: Impaired One Step Basic Commands: 75-100% accurate Two Step Basic Commands: 0-24% accurate Conversation: Simple Visual Recognition/Discrimination Discrimination: Not tested Reading Comprehension Reading Status: Not tested    Expression Expression Primary Mode of Expression: Verbal Verbal Expression Overall Verbal Expression: Impaired Initiation: No impairment Automatic Speech: Name;Counting;Day of week Level of Generative/Spontaneous Verbalization: Sentence Repetition: No impairment Naming: Impairment Responsive: 26-50% accurate Confrontation: Within functional limits Convergent: Not tested Divergent: 75-100% accurate Verbal Errors: Perseveration;Not aware of errors;Semantic paraphasias;Phonemic paraphasias Interfering Components:  (baseline laryngospasm, impacting fluency of speech) Written Expression Dominant Hand: Right Written Expression: Not tested   Oral / Motor  Oral Motor/Sensory  Function Overall Oral Motor/Sensory Function: Within functional limits Motor Speech Overall Motor  Speech: Appears within functional limits for tasks assessed Motor Planning: Witnin functional limits   [place], [place], [place] Office Number: [phone]  Acie Fredrickson 06/25/2020, 3:35 PM

## 2020-06-25 NOTE — Progress Notes (Signed)
PROGRESS NOTE  [name] HWE:[id number] DOB: 1931/08/11 DOA: 06/23/2020 PCP: Lauree Chandler, NP   LOS: 1 day   Brief Narrative / Interim history: 87 year old female with HTN, HLD, PAF on chronic anticoagulation, COPD, remote history of breast cancer status postresection, brought into the hospital by the son for confusion.  Over the last 3 to 4 days the son has noted some red flags, patient has been less oriented and unable to complete her ADLs without assistance which is unusual for her.  She could not remember how to work the remote control and could not remember whether she took her medications.  She also been having very poor p.o. intake and increased lethargy.  In the ED an MRI of the brain showed an acute to subacute infarct of the left thalamus and chronic lacunar infarct of the contralateral right thalamus that is new since January of last year, also several subacute to early chronic small bilateral white matter infarcts  Subjective / 24h Interval events: She is alert, somewhat slow to answer my questions.  Her son is at bedside and tells me she is still confused more so than her baseline  Assessment & Plan: Principal Problem Acute/subacute CVA, acute metabolic encephalopathy -patient was found to have a stroke on the MRI, neurology consulted.  MRI of the brain showed an acute to subacute infarct of the left thalamus and chronic lacunar infarct of the contralateral right thalamus that is new since January of last year, also several subacute to early chronic small bilateral white matter infarcts.  CT angiogram showed atherosclerotic disease at both carotid bifurcations without stenosis, 20% stenosis of the proximal right subclavian artery with widely patent vertebral arteries.  LDL was 92, she is on a statin.  A1c is 5.3.  TSH, B12 normal.  2D echo showed EF 55-60%, normal LVEF, no WMA.  RV systolic function was mildly reduced, RV size was normal.  PT recommending home health  PT  Active Problems Abnormal UA -patient with leukocytes in the UA, not clear whether she is having symptoms given underlying confusion.  Would limit antibiotic use to no more than 3 days.  Follow cultures  Hypertensive urgency-blood pressure improved today, allow a degree of permissive hypertension  PAF on chronic anticoagulation-resume Eliquis when okay with neurology.  She had an episode of A. fib with RVR overnight metoprolol has been resumed.  Rate is better today, continue to monitor for another day  Aortic valve stenosis status post AVR-she has a bioprosthetic valve in place in 2014  Hyperlipidemia-continue statin  CKD stage IIIa -creatinine at baseline  History of COPD-no wheezing  History of breast cancer-1998  Scheduled Meds: . aspirin EC  81 mg Oral Daily  . atorvastatin  40 mg Oral Daily  . metoprolol tartrate  25 mg Oral BID   Continuous Infusions: . cefTRIAXone (ROCEPHIN)  IV 1 g (06/25/20 0945)   PRN Meds:.acetaminophen **OR** acetaminophen (TYLENOL) oral liquid 160 mg/5 mL **OR** acetaminophen, albuterol, senna-docusate  Diet Orders (From admission, onward)    Start     Ordered   06/24/20 0817  Diet Heart Room service appropriate? Yes; Fluid consistency: Thin  Diet effective now       Question Answer Comment  Room service appropriate? Yes   Fluid consistency: Thin      06/24/20 0816          DVT prophylaxis:      Code Status: Full Code  Family Communication: son at bedside   Status is: Inpatient  Remains inpatient appropriate because:Altered mental status and Inpatient level of care appropriate due to severity of illness   Dispo: The patient is from: Home              Anticipated d/c is to: Home              Patient currently is not medically stable to d/c.   Difficult to place patient No   Level of care: Telemetry Medical  Consultants:  Neurology   Procedures:  2D echo  Microbiology  UA pending   Antimicrobials: Ceftriaxone 4/26  >> 4/28    Objective: Vitals:   06/24/20 2100 06/25/20 0008 06/25/20 0320 06/25/20 0700  BP: (!) 167/87 (!) 143/83 (!) 156/96 139/71  Pulse: 83 (!) 103 88 76  Resp: 18 15 19 18   Temp: 97.9 F (36.6 C) 97.6 F (36.4 C) 98.1 F (36.7 C) 97.7 F (36.5 C)  TempSrc: Oral  Oral Oral  SpO2: 99% 95% 96% 96%  Weight:      Height:       No intake or output data in the 24 hours ending 06/25/20 1048 Filed Weights   06/23/20 1918  Weight: 72.6 kg    Examination:  Constitutional: NAD Eyes: no scleral icterus ENMT: Mucous membranes are moist.  Neck: normal, supple Respiratory: clear to auscultation bilaterally, no wheezing, no crackles. Normal respiratory effort.  Cardiovascular: Regular rate and rhythm, no murmurs / rubs / gallops. No LE edema. Good peripheral pulses Abdomen: non distended, no tenderness. Bowel sounds positive.  Musculoskeletal: no clubbing / cyanosis.  Skin: no rashes Neurologic: No apparent focal deficits, slow to follow commands Psychiatric: Alert to self but not to situation   Data Reviewed: I have independently reviewed following labs and imaging studies   CBC: Recent Labs  Lab 06/23/20 1933 06/23/20 1945  WBC 6.5  --   NEUTROABS 4.2  --   HGB 13.3 13.6  HCT 40.2 40.0  MCV 100.0  --   PLT 160  --    Basic Metabolic Panel: Recent Labs  Lab 06/23/20 1933 06/23/20 1945 06/25/20 0203  NA 136 139 138  K 3.8 3.8 3.8  CL 100 100 103  CO2 27  --  25  GLUCOSE 162* 160* 96  BUN 17 19 13   CREATININE 1.14* 1.10* 0.95  CALCIUM 9.6  --  9.5  MG  --   --  1.8   Liver Function Tests: Recent Labs  Lab 06/23/20 1933  AST 22  ALT 19  ALKPHOS 55  BILITOT 0.8  PROT 6.3*  ALBUMIN 3.8   Coagulation Profile: Recent Labs  Lab 06/23/20 1933  INR 1.3*   HbA1C: Recent Labs    06/24/20 0751  HGBA1C 5.3   CBG: No results for input(s): GLUCAP in the last 168 hours.  Recent Results (from the past 240 hour(s))  SARS CORONAVIRUS 2 (TAT 6-24 HRS)  Nasopharyngeal Nasopharyngeal Swab     Status: None   Collection Time: 06/24/20  7:27 AM   Specimen: Nasopharyngeal Swab  Result Value Ref Range Status   SARS Coronavirus 2 NEGATIVE NEGATIVE Final    Comment: (NOTE) SARS-CoV-2 target nucleic acids are NOT DETECTED.  The SARS-CoV-2 RNA is generally detectable in upper and lower respiratory specimens during the acute phase of infection. Negative results do not preclude SARS-CoV-2 infection, do not rule out co-infections with other pathogens, and should not be used as the sole basis for treatment or other patient management decisions. Negative results must be combined  with clinical observations, patient history, and epidemiological information. The expected result is Negative.  Fact Sheet for Patients: SugarRoll.be  Fact Sheet for Healthcare Providers: https://www.woods-mathews.com/  This test is not yet approved or cleared by the [place] FDA and  has been authorized for detection and/or diagnosis of SARS-CoV-2 by FDA under an Emergency Use Authorization (EUA). This EUA will remain  in effect (meaning this test can be used) for the duration of the COVID-19 declaration under Se ction 564(b)(1) of the Act, 21 U.S.C. section 360bbb-3(b)(1), unless the authorization is terminated or revoked sooner.  Performed at [facility], [place] [address] [place], [place] [address]   Culture, Urine     Status: Abnormal   Collection Time: 06/24/20  7:54 AM   Specimen: Urine, Random  Result Value Ref Range Status   Specimen Description URINE, RANDOM  Final   Special Requests   Final    NONE Performed at [facility], [place] [address] [place], [place] [address]    Culture MULTIPLE SPECIES PRESENT, SUGGEST RECOLLECTION (A)  Final   Report Status 06/25/2020 FINAL  Final     Radiology Studies: ECHOCARDIOGRAM COMPLETE  Result Date: 06/24/2020    ECHOCARDIOGRAM REPORT   Patient Name:    [name] Date of Exam: 06/24/2020 Medical Rec #:  [id number]       Height:       64.0 in Accession #:    [id number]      Weight:       160.0 lb Date of Birth:  [birth year]       BSA:          1.779 m Patient Age:    67 years        BP:           156/87 mmHg Patient Gender: F               HR:           90 bpm. Exam Location:  Inpatient Procedure: 2D Echo Indications:    Stroke I63.9  History:        Patient has prior history of Echocardiogram examinations. COPD;                 Risk Factors:Hypertension and Dyslipidemia.                 Aortic Valve: 21 mm Edwards pericardial valve is present in the                 aortic position. Procedure Date: 08/30/2012.  Sonographer:    Mikki Santee RDCS (AE) Referring Phys: [id number] [facility] A SMITH  Sonographer Comments: Technically difficult study due to poor echo windows. Image acquisition challenging due to breast implants. IMPRESSIONS  1. Left ventricular ejection fraction, by estimation, is 55 to 60%. The left ventricle has normal function. The left ventricle has no regional wall motion abnormalities. There is mild concentric left ventricular hypertrophy. Left ventricular diastolic function could not be evaluated.  2. Right ventricular systolic function is mildly reduced. The right ventricular size is normal. There is normal pulmonary artery systolic pressure.  3. Left atrial size was mildly dilated.  4. The mitral valve is normal in structure. Trivial mitral valve regurgitation. No evidence of mitral stenosis.  5. Tricuspid valve regurgitation is mild to moderate.  6. The aortic valve has been repaired/replaced. Aortic valve regurgitation is trivial. No aortic stenosis is present. There is a 21 mm Edwards pericardial  valve present in the aortic position. Procedure Date: 08/30/2012. Echo findings are consistent with normal structure and function of the aortic valve prosthesis. Aortic valve mean gradient measures 8.8 mmHg. Aortic valve Vmax measures 2.23 m/s.  7. The  inferior vena cava is normal in size with greater than 50% respiratory variability, suggesting right atrial pressure of 3 mmHg. Comparison(s): No significant change from prior study. Prior images reviewed side by side. FINDINGS  Left Ventricle: Left ventricular ejection fraction, by estimation, is 55 to 60%. The left ventricle has normal function. The left ventricle has no regional wall motion abnormalities. The left ventricular internal cavity size was normal in size. There is  mild concentric left ventricular hypertrophy. Abnormal (paradoxical) septal motion consistent with post-operative status. Left ventricular diastolic function could not be evaluated due to atrial fibrillation. Left ventricular diastolic function could not be evaluated. Right Ventricle: The right ventricular size is normal. No increase in right ventricular wall thickness. Right ventricular systolic function is mildly reduced. There is normal pulmonary artery systolic pressure. The tricuspid regurgitant velocity is 2.77 m/s, and with an assumed right atrial pressure of 3 mmHg, the estimated right ventricular systolic pressure is [id number] mmHg. Left Atrium: Left atrial size was mildly dilated. Right Atrium: Right atrial size was normal in size. Pericardium: There is no evidence of pericardial effusion. Mitral Valve: The mitral valve is normal in structure. Mild to moderate mitral annular calcification. Trivial mitral valve regurgitation. No evidence of mitral valve stenosis. Tricuspid Valve: The tricuspid valve is normal in structure. Tricuspid valve regurgitation is mild to moderate. No evidence of tricuspid stenosis. Aortic Valve: The aortic valve has been repaired/replaced. Aortic valve regurgitation is trivial. No aortic stenosis is present. Aortic valve mean gradient measures 8.8 mmHg. Aortic valve peak gradient measures 19.9 mmHg. Aortic valve area, by VTI measures 0.76 cm. There is a 21 mm Edwards pericardial valve present in the aortic  position. Procedure Date: 08/30/2012. Echo findings are consistent with normal structure and function of the aortic valve prosthesis. Pulmonic Valve: The pulmonic valve was normal in structure. Pulmonic valve regurgitation is not visualized. No evidence of pulmonic stenosis. Aorta: The aortic root is normal in size and structure. Venous: The inferior vena cava is normal in size with greater than 50% respiratory variability, suggesting right atrial pressure of 3 mmHg. IAS/Shunts: No atrial level shunt detected by color flow Doppler.  LEFT VENTRICLE PLAX 2D LVIDd:         3.60 cm LVIDs:         2.20 cm LV PW:         1.30 cm LV IVS:        1.30 cm LVOT diam:     1.80 cm LV SV:         29 LV SV Index:   16 LVOT Area:     2.54 cm  RIGHT VENTRICLE RV S prime:     9.32 cm/s TAPSE (M-mode): 1.2 cm LEFT ATRIUM             Index       RIGHT ATRIUM           Index LA diam:        3.70 cm 2.08 cm/m  RA Area:     17.60 cm LA Vol (A2C):   90.3 ml 50.75 ml/m RA Volume:   40.20 ml  22.59 ml/m LA Vol (A4C):   63.1 ml 35.46 ml/m LA Biplane Vol: 77.6 ml 43.61 ml/m  AORTIC VALVE AV  Area (Vmax):    0.64 cm AV Area (Vmean):   0.66 cm AV Area (VTI):     0.76 cm AV Vmax:           223.21 cm/s AV Vmean:          142.261 cm/s AV VTI:            0.387 m AV Peak Grad:      19.9 mmHg AV Mean Grad:      8.8 mmHg LVOT Vmax:         56.10 cm/s LVOT Vmean:        37.000 cm/s LVOT VTI:          0.115 m LVOT/AV VTI ratio: 0.30  AORTA Ao Root diam: 2.10 cm TRICUSPID VALVE TR Peak grad:   30.7 mmHg TR Vmax:        277.00 cm/s  SHUNTS Systemic VTI:  0.12 m Systemic Diam: 1.80 cm Dani Gobble Croitoru MD Electronically signed by Sanda Klein MD Signature Date/Time: 06/24/2020/3:56:42 PM    Final     Marzetta Board, MD, PhD [facility]  Between 7 am - 7 pm I am available, please contact me via Amion (for emergencies) or Securechat (non urgent messages)  Between 7 pm - 7 am I am not available, please contact night coverage MD/APP via  Amion

## 2020-06-25 NOTE — Progress Notes (Addendum)
Occupational Therapy Evaluation Patient Details Name: [name] MRN: [id number] DOB: September 09, 1931 Today's Date: 06/25/2020    History of Present Illness Pt is an 87 y/o female admitted secondary to increased confusion. MRI revealing L thalamic CVA. PMH includes breast cancer, COPD, s/p AVR, CKD, a fib, HTN.   Clinical Impression   Patient admitted for above and limited by problem list below, including impaired cognition, impaired balance and decreased activity tolerance. Patient completing toilet transfers with min assist, ADLs with up to min assist and in room mobility with min guard assist using RW (given cueing for RW mgmt and safety).  Patient requires increased time for processing and sequencing, decreased safety awareness and recall.   Son present in room and correcting patients home setup information provided, but son reports "I think shes actually doing well today" when asked if this is different than her baseline (therefore unclear).  Discussed cognitive deficits seen with pt and son, educated on safety with IADLs and assist with med mgmt, cooking and driving at this time- pt and son agreeable. Noted asymptomatic afib during session HR ranging from 100-140s.  Believe she will benefit from further pill box test to assess functional cognition.  Will follow acutely but recommend HHOT at dc.      Follow Up Recommendations  Home health OT;Supervision/Assistance - 24 hour    Equipment Recommendations  3 in 1 bedside commode    Recommendations for Other Services       Precautions / Restrictions Precautions Precautions: Fall Restrictions Weight Bearing Restrictions: No      Mobility Bed Mobility               General bed mobility comments: OOB upon entry with PT    Transfers Overall transfer level: Needs assistance Equipment used: Rolling walker (2 wheeled) Transfers: Sit to/from Stand Sit to Stand: Min assist;Min guard         General transfer comment: min guard  from arm chair but min assist from commode to power up and steady    Balance Overall balance assessment: Needs assistance Sitting-balance support: No upper extremity supported;Feet supported Sitting balance-Leahy Scale: Fair     Standing balance support: No upper extremity supported;During functional activity;Bilateral upper extremity supported Standing balance-Leahy Scale: Poor Standing balance comment: relies on BUE and external support                           ADL either performed or assessed with clinical judgement   ADL Overall ADL's : Needs assistance/impaired     Grooming: Min guard;Standing       Lower Body Bathing: Minimal assistance;Sit to/from stand   Upper Body Dressing : Set up;Sitting   Lower Body Dressing: Minimal assistance;Sit to/from stand   Toilet Transfer: Minimal assistance;Ambulation;RW;Grab bars   Toileting- Clothing Manipulation and Hygiene: Minimal assistance;Sit to/from stand       Functional mobility during ADLs: Minimal assistance;Rolling walker;Cueing for sequencing;Cueing for safety General ADL Comments: pt limited by cognition and activity tolerance     Vision Baseline Vision/History: Wears glasses Wears Glasses: At all times Patient Visual Report: No change from baseline Vision Assessment?: No apparent visual deficits     Perception     Praxis      Pertinent Vitals/Pain Pain Assessment: No/denies pain     Hand Dominance Right   Extremity/Trunk Assessment Upper Extremity Assessment Upper Extremity Assessment: Generalized weakness   Lower Extremity Assessment Lower Extremity Assessment: Defer to PT evaluation  Cervical / Trunk Assessment Cervical / Trunk Assessment: Kyphotic   Communication Communication Communication: HOH   Cognition Arousal/Alertness: Awake/alert Behavior During Therapy: Flat affect Overall Cognitive Status: Impaired/Different from baseline Area of Impairment: Memory;Following  commands;Safety/judgement;Awareness;Problem solving                     Memory: Decreased short-term memory Following Commands: Follows one step commands consistently;Follows one step commands with increased time;Follows multi-step commands inconsistently Safety/Judgement: Decreased awareness of safety;Decreased awareness of deficits Awareness: Emergent Problem Solving: Slow processing;Decreased initiation;Difficulty sequencing;Requires verbal cues General Comments: patient presenting with slow processing and decreased sequencing, poor safety awareness.  son present and correcting home setup information as patient telling therapist differently.  decreases awareness of deficits (educated on not driving at this time) WIll need further assessment with pill box test.   General Comments  pts son present and supportive; educated pt and son on recommendations for assist with IADLss--meds, cooking and driving at this time    Exercises     Shoulder Instructions      Home Living Family/patient expects to be discharged to:: Private residence Living Arrangements: Children Available Help at Discharge: Family;Available PRN/intermittently Type of Home: House Home Access: Stairs to enter [employer] of Steps: 13 Entrance Stairs-Rails: Left;Right Home Layout: One level     Bathroom Shower/Tub: Occupational psychologist: Standard     Home Equipment: Cane - single point;Shower seat;Shower seat - built in;Grab bars - tub/shower          Prior Functioning/Environment Level of Independence: Independent        Comments: driving, IADls        OT Problem List: Decreased strength;Decreased activity tolerance;Impaired balance (sitting and/or standing);Decreased cognition;Decreased safety awareness;Decreased knowledge of use of DME or AE;Decreased knowledge of precautions      OT Treatment/Interventions: Self-care/ADL training;Therapeutic exercise;DME and/or AE  instruction;Therapeutic activities;Cognitive remediation/compensation;Patient/family education;Balance training    OT Goals(Current goals can be found in the care plan section) Acute Rehab OT Goals Patient Stated Goal: to go home OT Goal Formulation: With patient Time For Goal Achievement: 07/09/20 Potential to Achieve Goals: Good  OT Frequency: Min 2X/week   Barriers to D/C:            Co-evaluation              AM-PAC OT "6 Clicks" Daily Activity     Outcome Measure Help from another person eating meals?: None Help from another person taking care of personal grooming?: A Little Help from another person toileting, which includes using toliet, bedpan, or urinal?: A Little Help from another person bathing (including washing, rinsing, drying)?: A Little Help from another person to put on and taking off regular upper body clothing?: A Little Help from another person to put on and taking off regular lower body clothing?: A Little 6 Click Score: 19   End of Session Equipment Utilized During Treatment: Gait belt;Rolling walker Nurse Communication: Mobility status  Activity Tolerance: Patient tolerated treatment well Patient left: in chair;with call bell/phone within reach;with chair alarm set;with family/visitor present  OT Visit Diagnosis: Other abnormalities of gait and mobility (R26.89);Muscle weakness (generalized) (M62.81);Other symptoms and signs involving cognitive function                Time: 3790-2409 OT Time Calculation (min): 21 min Charges:  OT General Charges $OT Visit: 1 Visit OT Evaluation $OT Eval Moderate Complexity: Jay, OT Acute Rehabilitation Services Pager [phone] Office [phone]  Delight Stare 06/25/2020, 10:54 AM

## 2020-06-25 NOTE — Progress Notes (Signed)
HOSPITAL MEDICINE OVERNIGHT EVENT NOTE    Notified by nursing the patient is exhibiting episodes of rapid atrial fibrillation with heart rates over 130 bpm.  Chart reviewed, patient is typically on metoprolol 25 mg by mouth twice daily which has yet to be resumed during this hospitalization.  Will provide patient 2.5 mg IV metoprolol now and resume oral metoprolol later this morning.  Monitoring patient closely on telemetry.  Vernelle Emerald  MD [facility]

## 2020-06-25 NOTE — Progress Notes (Signed)
STROKE TEAM PROGRESS NOTE   INTERVAL HISTORY   I personally reviewed history of presenting illness with the patient, review of electronic medical records and imaging films in PACS.  She presented with subacute confusion for 2 to 3 days which has persisted.  MRI scan shows an acute left medial thalamic lacunar infarct as well as old remote age lacunar this.  She has history of A. fib and has been compliant with Eliquis.  Echocardiogram shows normal ejection fraction without cardiac source of embolism.  CT angiogram shows no significant large vessel stenosis or occlusion.  LDL cholesterol is 92 mg percent.  Hemoglobin A1c is 5.3. Vitals:   06/24/20 2100 06/25/20 0008 06/25/20 0320 06/25/20 0700  BP: (!) 167/87 (!) 143/83 (!) 156/96 139/71  Pulse: 83 (!) 103 88 76  Resp: 18 15 19 18   Temp: 97.9 F (36.6 C) 97.6 F (36.4 C) 98.1 F (36.7 C) 97.7 F (36.5 C)  TempSrc: Oral  Oral Oral  SpO2: 99% 95% 96% 96%  Weight:      Height:       CBC:  Recent Labs  Lab 06/23/20 1933 06/23/20 1945  WBC 6.5  --   NEUTROABS 4.2  --   HGB 13.3 13.6  HCT 40.2 40.0  MCV 100.0  --   PLT 160  --    Basic Metabolic Panel:  Recent Labs  Lab 06/23/20 1933 06/23/20 1945 06/25/20 0203  NA 136 139 138  K 3.8 3.8 3.8  CL 100 100 103  CO2 27  --  25  GLUCOSE 162* 160* 96  BUN 17 19 13   CREATININE 1.14* 1.10* 0.95  CALCIUM 9.6  --  9.5  MG  --   --  1.8   Lipid Panel:  Recent Labs  Lab 06/24/20 1932  CHOL 169  TRIG 54  HDL 66  CHOLHDL 2.6  VLDL 11  LDLCALC 92   HgbA1c:  Recent Labs  Lab 06/24/20 0751  HGBA1C 5.3   Urine Drug Screen:  Recent Labs  Lab 06/23/20 1923  LABOPIA NONE DETECTED  COCAINSCRNUR NONE DETECTED  LABBENZ NONE DETECTED  AMPHETMU NONE DETECTED  THCU NONE DETECTED  LABBARB NONE DETECTED    Alcohol Level  Recent Labs  Lab 06/23/20 1933  ETH <10    IMAGING past 24 hours        PHYSICAL EXAM  GENERAL: Awake, alert in NAD. Lying in ED bed.  HEENT:  - Normocephalic and atraumatic. Moist mucous membranes.  LUNGS - Normal respiratory effort.  CV - RRR on tele ABDOMEN - Soft, nontender Ext: warm, well perfused Psych: Affect appropriate to situation.   NEURO:  Mental Status:  Speech/Language: speech is without dysarthria or aphasia. However, due to laryngeal spasm history, her speech rhythm is not normal. Naming, repetition, fluency, and comprehension intact.  Diminished attention, registration and recall.  Poor insight into her condition. Cranial Nerves:  II: PERRL 52mm/brisk. visual fields full.  III, IV, VI: EOMI. Lid elevation symmetric and full.  V: sensation is intact and symmetrical to face. Moves jaw back and forth.  VII: Smile is symmetrical. Able to puff cheeks and raise eyebrows.  VIII: slight bilat hearing impaiment to voice IX, X: palate elevation is symmetric. Hypophonic.  XI: normal sternocleidomastoid and trapezius muscle strength JJH:ERDEYC is symmetrical without fasciculations.   Motor: 4-/5 strength to right grip, right tricep, right bicep. 4/5 left grip, left biceps, left triceps. 4/5 strength to BLEs.  Tone is normal. Bulk is normal.  Sensation- Intact to light touch bilaterally in all four extremities. Extinction absent to light touch to DSS.  Coordination: FTN intact bilaterally. No drift.  DTRs: 2+ bilateral brachioradialis. 0 patella. .  Gait- deferred ASSESSMENT/PLAN Ms. RAELAN BURGOON is a 87 y.o. female with history of HTN, AS s/p bioprosthetic valve replacement 2014, PVC, AF rate controlled on beta blocker on therapeutic AC with eliquis, laryngeal spasms s/p botox, CKD III, HLD, COPD, OA, and osteoporosis  presenting with subacute confusion for 2 to 3 days due to left thalamic lacunar infarct  Lacunar left thalamic infarct due to SVD  Carotid Doppler not ordered  CT angiogram atherosclerotic disease at both carotid bulb furcation without significant stenosis.  20% stenosis of proximal right subclavian  artery.  Intracranial vessel irregularity but no large vessel stenosis or occlusion.  2D Echo normal ejection fraction.  LDL 92  HgbA1c 5.3  VTE prophylaxis -Eliquis    Diet   Diet Heart Room service appropriate? Yes; Fluid consistency: Thin     Ok to restart Eliquis   Therapy recommendations: Home health   disposition: Pending  Hypertension  Home meds: Metoprolol and Diovan   Stable . Permissive hypertension (OK if < 220/120) but gradually normalize in 5-7 days . Long-term BP goal normotensive  Hyperlipidemia  Home meds: None  LDL 92, goal < 70  Add Lipitor 40 mg daily  High intensity statin not necessary due to marginally elevated LDL  Continue statin at discharge  Diabetes type II  Controlled  Home meds: None  HgbA1c 5.3, goal < 7.0  CBGs  No results for input(s): GLUCAP in the last 72 hours.   [facility] day # 1 I have personally obtained history,examined this patient, reviewed notes, independently viewed imaging studies, participated in medical decision making and plan of care.ROS completed by me personally and pertinent positives fully documented  I have made any additions or clarifications directly to the above note. Agree with note above.  Patient presented with subacute confusion due to thalamic infarct.  I suspect he has mild cognitive impairment at baseline but has not gotten worse due to his stroke.  Recommend continue Eliquis for stroke prevention due to history of A. fib and aggressive risk factor modification.  Add Lipitor for elevated LDL.  Patient may need more supervision at home.  Long discussion with son at the bedside and answered questions.  Discussed with Dr. Gloris Ham.  Greater than 50% time during this 35-minute visit was spent on counseling and coordination of care about a lacunar stroke and cognitive impairment and atrial fibrillation discussion about stroke prevention and answering questions.  Stroke team will sign off.  Kindly  call for questions.  Antony Contras, MD Medical Director [facility] Pager: [phone] 06/25/2020 6:00 PM   To contact Stroke Continuity provider, please refer to [email or web address] After hours, contact General Neurology

## 2020-06-25 NOTE — Telephone Encounter (Signed)
Daughter called and stated that patient had a mini stroke and UTI and has been admitted to the hospital.

## 2020-06-25 NOTE — TOC Initial Note (Signed)
Transition of Care [facility]) - Initial/Assessment Note    Patient Details  Name: [name] MRN: [id number] Date of Birth: 1931-03-28  [facility] of [facility]) CM/SW Contact:    Pollie Friar, RN Phone Number: 06/25/2020, 3:56 PM  Clinical Narrative:                 Patient lives with her son that is at the bedside. Son is with her most of the time. They deny issues with home medications or transportation. Recommendations for [facility]. Patient and son have no preference on the agency. Cory with [facility] accepted the referral.  TOC following for further d/c needs.   Expected Discharge Plan: [place] Barriers to Discharge: Continued Medical Work up   Patient Goals and CMS Choice   CMS Medicare.gov Compare Post Acute Care list provided to:: Patient Choice offered to / list presented to : [place]  Expected Discharge Plan and Services Expected Discharge Plan: [place]   Discharge Planning Services: CM Consult Post Acute Care Choice: [place] arrangements for the past 2 months: [place] Arranged: PT,OT,Speech Therapy HH Agency: [facility] Date [place]: 06/25/20   Representative spoke with at [place]: Tommi Rumps  Prior Living Arrangements/Services Living arrangements for the past 2 months: [place] Lives with:: Adult Children Patient language and need for interpreter reviewed:: Yes Do you feel safe going back to the place where you live?: Yes      Need for Family Participation in Patient Care: Yes (Comment) Care giver support system in place?: Yes (comment) Current home services: DME (shower seat/ 3 in 1/) Criminal Activity/Legal Involvement Pertinent to Current Situation/Hospitalization: No - Comment as needed  Activities of Daily Living      Permission Sought/Granted                  Emotional Assessment Appearance:: Appears stated  age Attitude/Demeanor/Rapport: Engaged Affect (typically observed): Accepting Orientation: : Oriented to Self   Psych Involvement: No (comment)  Admission diagnosis:  CVA (cerebral vascular accident) ([place]) [I63.9] Acute cystitis without hematuria [N30.00] Cerebrovascular accident (CVA), unspecified mechanism ([place]) [I63.9] Patient Active Problem List   Diagnosis Date Noted  . CVA (cerebral vascular accident) ([place]) 06/24/2020  . Hypertensive urgency 06/24/2020  . Cough variant asthma 01/22/2019  . Upper airway cough syndrome 08/23/2018  . Chronic right shoulder pain 06/15/2017  . Shoulder arthritis 06/15/2017  . CKD (chronic kidney disease) stage 3, GFR 30-59 ml/min (HCC) 09/08/2016  . Carotid stenosis 05/05/2016  . Injury of left ankle 03/11/2016  . Elbow pain, right 10/10/2013  . Cough 09/12/2013  . COPD exacerbation ([place]) 05/16/2013  . Acute bronchitis 04/11/2013  . 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  . Long term (current) use of anticoagulants 10/02/2012  . Overactive bladder 09/11/2012  . Acute blood loss anemia 09/11/2012  . Weakness generalized 09/11/2012  . Atrial fibrillation ([place]) 09/04/2012  . S/P aortic valve replacement with bioprosthetic valve 08/30/2012  . Aortic valve stenosis, critical 08/28/2012  . Normal coronary arteries-6/14 08/15/2012  . DOE (dyspnea on exertion) 08/15/2012  . Chest pain-exertional 08/15/2012  . COPD (chronic obstructive pulmonary disease)- noted on CXR 08/08/12 08/15/2012  . Aortic stenosis- critical AS at cath 08/14/12, with surgery 07/20/2012  . History of total hip arthroplasty 06/29/2012  .  Unspecified constipation 06/06/2012  . Insomnia 06/06/2012  . Essential hypertension 06/06/2012  . Osteoporosis 06/06/2012  . GERD (gastroesophageal reflux disease) 06/06/2012  . Laryngeal spasm 12/25/2010  . History of breast cancer 09/29/1995   PCP:  Lauree Chandler, NP Pharmacy:   [facility] [address] [place] [address] Phone: [phone] Fax: [phone]     Social Determinants of Health (SDOH) Interventions    Readmission Risk Interventions No flowsheet data found.

## 2020-06-25 NOTE — Progress Notes (Signed)
ANTICOAGULATION CONSULT NOTE - Initial Consult  Pharmacy Consult for Apixaban  Indication:  Nonvalvular Atrial fibrillation   Allergies  Allergen Reactions  . Lisinopril Cough  . Codeine Rash    All over the body.    Patient Measurements: Height: 5\' 4"  (162.6 cm) Weight: 72.6 kg (160 lb) IBW/kg (Calculated) : 54.7   Vital Signs: Temp: 97.6 F (36.4 C) (04/27 1142) Temp Source: Oral (04/27 1142) BP: 112/67 (04/27 1142) Pulse Rate: 65 (04/27 1142)  Labs: Recent Labs    06/23/20 1933 06/23/20 1945 06/25/20 0203  HGB 13.3 13.6  --   HCT 40.2 40.0  --   PLT 160  --   --   APTT 37*  --   --   LABPROT 16.0*  --   --   INR 1.3*  --   --   CREATININE 1.14* 1.10* 0.95    Estimated Creatinine Clearance: 40.8 mL/min (by C-G formula based on SCr of 0.95 mg/dL).   Medical History: Past Medical History:  Diagnosis Date  . Aortic stenosis 07/20/2012   Aortic stenosis   . Aortic valve disorder 08/13/2011   ECHO - EF >81%; mild diastolic dysfunction; calcified aortic valve, not well visualized; mod/severe aortic stenosis w/ worsening gradients when compared to 2012  . Arthritis   . Cancer ([place])    Breast  . Carotid bruit 08/06/2008   Doppler - R ECA demonstrates noarrowing w/ elevated velocities consistent w/ >70% diameter reduction; R and L ICAs show no evidence of diameter reduction, significant tortuosity or vascular abnormality;   . Change in voice   . COPD (chronic obstructive pulmonary disease) ([place])   . Hearing loss   . Heart murmur   . Hyperlipidemia   . Hypertension    dr Gwenlyn Found  . Osteoporosis   . PAF (paroxysmal atrial fibrillation) ([place]) 09/04/2012  . PVD (peripheral vascular disease) ([place]) 08/13/2010   R/P MV - normal pattern of perfusion in all regions, EF 76%; no significant wall abnormalities noted; normal perfusion study  . Right carotid bruit    high-grade right external carotid artery stenosis by 2 parts ultrasound 2 years ago  . S/P aortic valve  replacement with bioprosthetic valve 08/30/2012   21 mm [facility] Ease bovine pericardial tissue valve    Medications:  Medications Prior to Admission  Medication Sig Dispense Refill Last Dose  . acetaminophen (TYLENOL) 325 MG tablet Take 650 mg by mouth at bedtime as needed (pain).   unknown  . acetaminophen (TYLENOL) 650 MG CR tablet Take 650 mg by mouth every morning.   06/22/2020  . albuterol (PROAIR HFA) 108 (90 Base) MCG/ACT inhaler INHALE 1 PUFF INTO THE LUNGS EVERY 6 (SIX) HOURS AS NEEDED FOR WHEEZING OR SHORTNESS OF BREATH. (Patient taking differently: Inhale 1 puff into the lungs every 6 (six) hours as needed for shortness of breath or wheezing. INHALE 1 PUFF INTO THE LUNGS EVERY 6 (SIX) HOURS AS NEEDED FOR WHEEZING OR SHORTNESS OF BREATH.) 8.5 Inhaler 2 unknown  . amoxicillin (AMOXIL) 500 MG tablet Take 4 by mouth 1 hour prior to dental procedure   unknown  . apixaban (ELIQUIS) 5 MG TABS tablet TAKE 1 TABLET BY MOUTH TWICE DAILY. (Patient taking differently: Take 5 mg by mouth 2 (two) times daily.) 180 tablet 1 06/23/2020 at 1630  . Ascorbic Acid (VITAMIN C) 100 MG tablet Take 100 mg by mouth daily.   06/23/2020 at Unknown time  . Calcium Citrate-Vitamin D (CITRACAL + D PO) Take 1 tablet  by mouth 2 (two) times daily.    06/23/2020 at Unknown time  . Chlorpheniramine Maleate (EQ CHLORTABS PO) Take 1 tablet by mouth every 4 (four) hours as needed.   unknown  . dextromethorphan-guaiFENesin (MUCINEX DM) 30-600 MG 12hr tablet Take 1 tablet by mouth 2 (two) times daily as needed.    unknown  . famotidine (PEPCID) 20 MG tablet One after supper (Patient taking differently: Take 20 mg by mouth daily after supper.)   06/23/2020 at Unknown time  . fluticasone (FLONASE) 50 MCG/ACT nasal spray PLACE 1 SPRAY INTO BOTH NOSTRILS TWICE DAILY AS NEEDED FOR ALLERGIES OR RHINITIS. (Patient taking differently: Place 1 spray into both nostrils 2 (two) times daily as needed for allergies or rhinitis.) 16 g 2  unknown  . hydrOXYzine (ATARAX/VISTARIL) 25 MG tablet Take one tablet by mouth daily as needed for itching. (Patient taking differently: Take 25 mg by mouth as needed for itching.) 30 tablet 3 unknown  . Magnesium Hydroxide (PHILLIPS MILK OF MAGNESIA PO) Take 15 mL by mouth. Take daily at 6 o'clock at night   Past Week at Unknown time  . metoprolol tartrate (LOPRESSOR) 25 MG tablet TAKE 1 TABLET (25 MG TOTAL) BY MOUTH 2 (TWO) TIMES DAILY. 180 tablet 3 06/23/2020 at 1630  . mirabegron ER (MYRBETRIQ) 50 MG TB24 tablet TAKE 1 TABLET BY MOUTH ONCE DAILY. (Patient taking differently: Take 50 mg by mouth daily.) 30 tablet 11 06/23/2020 at Unknown time  . Omega-3 Fatty Acids (FISH OIL PO) Take 1 capsule by mouth daily.   06/23/2020 at Unknown time  . omeprazole (PRILOSEC) 20 MG capsule Take 20 mg by mouth daily.   06/23/2020 at Unknown time  . shark liver oil-cocoa butter (PREPARATION H) 0.25-3-85.5 % suppository Place 1 suppository rectally as needed for hemorrhoids.   unknown  . Specialty Vitamins Products (ADVANCED COLLAGEN PO) Take 1,000 mg by mouth daily.   06/23/2020 at Unknown time  . valsartan (DIOVAN) 160 MG tablet TAKE 1 TABLET (160 MG TOTAL) BY MOUTH IN THE MORNING. (Patient taking differently: Take 160 mg by mouth every morning.) 90 tablet 1 06/22/2020  . valsartan (DIOVAN) 80 MG tablet TAKE 1 TABLET (80 MG TOTAL) BY MOUTH EVERY EVENING. (Patient taking differently: Take 80 mg by mouth every evening.) 90 tablet 1 06/23/2020 at Unknown time  . vitamin E 400 UNIT capsule Take 400 Units by mouth daily.   06/23/2020 at Unknown time  . metoprolol tartrate (LOPRESSOR) 25 MG tablet TAKE 1 TABLET (25 MG TOTAL) BY MOUTH 2 (TWO) TIMES DAILY. (Patient not taking: Reported on 06/24/2020) 180 tablet 3 Not Taking at Unknown time  . mirabegron ER (MYRBETRIQ) 50 MG TB24 tablet Take 1 tablet (50 mg total) by mouth daily. (Patient not taking: Reported on 06/24/2020) 90 tablet 1 Not Taking at Unknown time     Assessment: 87 year old female with PAF on chronic anticoagulation with Apixaban 5 mg BID prior to admit.   [facility]  ED with confusion.  Found to have acute /subacute CVA, acute metabolic encephalopathy.   Apixaban was held on 4/26.  4/27, the MD noted the patient had an episode of A. fib with RVR overnight metoprolol has been resumed.  Rate is better today,  Pharmacy consulted on 4/27 to resume her apixaban for nonvalvular Afib.    PMH:  HTN, HLD, PAF on chronic anticoagulation, COPD, remote history of breast cancer  status postresection,  And aortic valve stenosis s/p AVR bioprosthetic valve in place in 2014.  Goal of Therapy:  Monitor platelets by anticoagulation protocol: Yes   Plan:  Resume Apixaban 5 mg BID Monitor renal function and for signs /symptoms of bleeding  Thank you for allowing pharmacy to be part of this patients care team.  Nicole Cella, [place] Pharmacist [phone] Please check AMION for all [place] phone numbers [phone] After 10:00 PM, call [place] [phone] 06/25/2020,2:16 PM

## 2020-06-25 NOTE — Progress Notes (Signed)
Physical Therapy Treatment Patient Details Name: [name] MRN: [id number] DOB: [birth year] Today's Date: 06/25/2020    History of Present Illness Pt is an 87 y/o female admitted secondary to increased confusion. MRI revealing L thalamic CVA. PMH includes breast cancer, COPD, s/p AVR, CKD, a fib, HTN.   PT Comments    Pt progressing well with mobility. Pt requires up to minA for mobility with RW. Pt limited by generalized weakness, decreased activity tolerance and cognitive impairment, including slowed processing, difficulty sequencing and poor recall. Son present and supportive, reports family able to provide necessary assist, as well as RW for pt to use at home. Will continue to follow acutely.  HR up to 140s with activity, pt asymptomatic   Follow Up Recommendations  Home health PT;Supervision/Assistance - 24 hour     Equipment Recommendations  None recommended by PT    Recommendations for Other Services       Precautions / Restrictions Precautions Precautions: Fall;Other (comment) Precaution Comments: Watch HR Restrictions Weight Bearing Restrictions: No    Mobility  Bed Mobility Overal bed mobility: Needs Assistance Bed Mobility: Supine to Sit     Supine to sit: Min assist     General bed mobility comments: Pt struggling to power into sitting, requiring momentum adn very light minA to elevate trunk    Transfers Overall transfer level: Needs assistance Equipment used: Rolling walker (2 wheeled) Transfers: Sit to/from Stand Sit to Stand: Min assist;Min guard         General transfer comment: Multiple sit<>stands from EOB and arm chair; initially requiring repeated cues for correct hand placement and initial minA for trunk elevation; demonstrates carryover on final trial to RW, min guard for balance  Ambulation/Gait Ambulation/Gait assistance: Min guard Gait Distance (Feet): 120 Feet Assistive device: Rolling walker (2 wheeled) Gait Pattern/deviations:  Step-through pattern;Decreased stride length Gait velocity: Decreased   General Gait Details: Pt requesting RW use for mobility, reports planning to use at home. Slow, mostly steady gait with RW and intermittent min guard for balance; cues for activity pacing   Stairs             Wheelchair Mobility    Modified Rankin (Stroke Patients Only) Modified Rankin (Stroke Patients Only) Pre-Morbid Rankin Score: No significant disability Modified Rankin: Moderately severe disability     Balance Overall balance assessment: Needs assistance Sitting-balance support: No upper extremity supported;Feet supported Sitting balance-Leahy Scale: Fair     Standing balance support: No upper extremity supported;During functional activity;Bilateral upper extremity supported Standing balance-Leahy Scale: Fair Standing balance comment: Can static stand at sink without UE support, static and dynamic stability improved with at least single UE support                            Cognition Arousal/Alertness: Awake/alert Behavior During Therapy: Flat affect Overall Cognitive Status: Impaired/Different from baseline Area of Impairment: Memory;Following commands;Safety/judgement;Awareness;Problem solving                     Memory: Decreased short-term memory Following Commands: Follows one step commands consistently;Follows one step commands with increased time;Follows multi-step commands inconsistently Safety/Judgement: Decreased awareness of safety;Decreased awareness of deficits Awareness: Emergent Problem Solving: Slow processing;Decreased initiation;Difficulty sequencing;Requires verbal cues General Comments: patient presenting with slow processing and decreased sequencing, poor safety awareness.  son present and correcting home setup information as patient telling therapist differently.  decreases awareness of deficits (educated on not driving at this  time) WIll need further  assessment with pill box test.      Exercises      General Comments General comments (skin integrity, edema, etc.): Pt's son present and supportive. Son has RW pt can use upon return home; son able to observe session and current assist/cues pt needing to mobilize, reports family can assist with this      Pertinent Vitals/Pain Pain Assessment: No/denies pain    Home Living Family/patient expects to be discharged to:: Private residence Living Arrangements: Children Available Help at Discharge: Family;Available PRN/intermittently Type of Home: House Home Access: Stairs to enter Entrance Stairs-Rails: Left;Right Home Layout: One level Home Equipment: Cane - single point;Shower seat;Shower seat - built in;Grab bars - tub/shower      Prior Function Level of Independence: Independent      Comments: driving, IADls   PT Goals (current goals can now be found in the care plan section) Acute Rehab PT Goals Patient Stated Goal: to go home Progress towards PT goals: Progressing toward goals    Frequency    Min 4X/week      PT Plan Current plan remains appropriate    Co-evaluation              AM-PAC PT "6 Clicks" Mobility   Outcome Measure  Help needed turning from your back to your side while in a flat bed without using bedrails?: None Help needed moving from lying on your back to sitting on the side of a flat bed without using bedrails?: A Little Help needed moving to and from a bed to a chair (including a wheelchair)?: A Little Help needed standing up from a chair using your arms (e.g., wheelchair or bedside chair)?: A Little Help needed to walk in hospital room?: A Little Help needed climbing 3-5 steps with a railing? : A Little 6 Click Score: 19    End of Session Equipment Utilized During Treatment: Gait belt Activity Tolerance: Patient tolerated treatment well Patient left: with family/visitor present (with OT) Nurse Communication: Mobility status PT Visit  Diagnosis: Unsteadiness on feet (R26.81);Muscle weakness (generalized) (M62.81)     Time: 3220-2542 PT Time Calculation (min) (ACUTE ONLY): 24 min  Charges:  $Gait Training: 8-22 mins $Therapeutic Activity: 8-22 mins                     Mabeline Caras, PT, DPT Acute Rehabilitation Services  Pager [phone] Office [employer] 06/25/2020, 12:53 PM

## 2020-06-26 ENCOUNTER — Inpatient Hospital Stay (HOSPITAL_COMMUNITY): Payer: Medicare Other

## 2020-06-26 ENCOUNTER — Other Ambulatory Visit (HOSPITAL_COMMUNITY): Payer: Self-pay

## 2020-06-26 DIAGNOSIS — N1831 Chronic kidney disease, stage 3a: Secondary | ICD-10-CM | POA: Diagnosis not present

## 2020-06-26 DIAGNOSIS — N3000 Acute cystitis without hematuria: Secondary | ICD-10-CM | POA: Diagnosis not present

## 2020-06-26 DIAGNOSIS — I4811 Longstanding persistent atrial fibrillation: Secondary | ICD-10-CM | POA: Diagnosis not present

## 2020-06-26 DIAGNOSIS — Z853 Personal history of malignant neoplasm of breast: Secondary | ICD-10-CM | POA: Diagnosis not present

## 2020-06-26 LAB — BASIC METABOLIC PANEL
Anion gap: 12 (ref 5–15)
BUN: 22 mg/dL (ref 8–23)
CO2: 22 mmol/L (ref 22–32)
Calcium: 9.2 mg/dL (ref 8.9–10.3)
Chloride: 101 mmol/L (ref 98–111)
Creatinine, Ser: 1.08 mg/dL — ABNORMAL HIGH (ref 0.44–1.00)
GFR, Estimated: 50 mL/min — ABNORMAL LOW (ref >60)
Glucose, Bld: 142 mg/dL — ABNORMAL HIGH (ref 70–99)
Potassium: 3.9 mmol/L (ref 3.5–5.1)
Sodium: 135 mmol/L (ref 135–145)

## 2020-06-26 LAB — CBC
HCT: 38.6 % (ref 36.0–46.0)
Hemoglobin: 13.1 g/dL (ref 12.0–15.0)
MCH: 33 pg (ref 26.0–34.0)
MCHC: 33.9 g/dL (ref 30.0–36.0)
MCV: 97.2 fL (ref 80.0–100.0)
Platelets: 161 10*3/uL (ref 150–400)
RBC: 3.97 MIL/uL (ref 3.87–5.11)
RDW: 12.7 % (ref 11.5–15.5)
WBC: 14 10*3/uL — ABNORMAL HIGH (ref 4.0–10.5)
nRBC: 0 % (ref 0.0–0.2)

## 2020-06-26 MED ORDER — ATORVASTATIN CALCIUM 40 MG PO TABS
40.0000 mg | ORAL_TABLET | Freq: Every day | ORAL | 0 refills | Status: DC
  Filled 2020-06-26: qty 30, 30d supply, fill #0

## 2020-06-26 MED ORDER — METHYLPREDNISOLONE SODIUM SUCC 40 MG IJ SOLR
40.0000 mg | Freq: Every day | INTRAMUSCULAR | Status: DC
  Administered 2020-06-26 – 2020-06-27 (×2): 40 mg via INTRAVENOUS
  Filled 2020-06-26 (×2): qty 1

## 2020-06-26 MED ORDER — KETOROLAC TROMETHAMINE 15 MG/ML IJ SOLN
15.0000 mg | Freq: Once | INTRAMUSCULAR | Status: AC
  Administered 2020-06-26: 15 mg via INTRAVENOUS
  Filled 2020-06-26: qty 1

## 2020-06-26 MED ORDER — HYDROXYZINE HCL 10 MG PO TABS
10.0000 mg | ORAL_TABLET | Freq: Three times a day (TID) | ORAL | Status: DC | PRN
  Administered 2020-06-26: 10 mg via ORAL
  Filled 2020-06-26 (×2): qty 1

## 2020-06-26 MED ORDER — FAMOTIDINE IN NACL 20-0.9 MG/50ML-% IV SOLN
20.0000 mg | INTRAVENOUS | Status: DC
  Administered 2020-06-26: 20 mg via INTRAVENOUS
  Filled 2020-06-26 (×2): qty 50

## 2020-06-26 NOTE — Discharge Summary (Addendum)
Physician Discharge Summary  [name]:[id number] DOB: [birth year] DOA: 06/23/2020  PCP: Lauree Chandler, NP  Admit date: 06/23/2020 Discharge date: 06/27/2020  Admitted From: home Disposition:  home  Recommendations for Outpatient Follow-up:  1. Follow up with PCP in 1-2 weeks  Home Health: PT, OT, SLP Equipment/Devices: none  Discharge Condition: stable CODE STATUS: Full code Diet recommendation: heart healthy  HPI: Per admitting MD, hyperlipidemia, paroxysmal atrial fibrillation on anticoagulation, aortic stenosis, COPD, remote history of breast cancer status post resection, and PVD who presents with reports of confusion.  Most of history is obtained from the patient's son who is staying with her to help out.  At baseline patient is alert and oriented and able to complete her ADLs without assistance.  However, over the last 2 days the patient's son had noticed that she had been intermittently confused.  She could not remember how to work the remote control, tried using the house phone to change the TV channels, and could not remember if she had taken her medications.  He reports that normally she has a set routine, but has not been following it.  Her movements were very slowed, she was not eating or drinking, and was sleeping all day.  Patient states that she is fine.  She admits to having an intermittent cough that is chronic and has recently had no appetite.  Denies any shortness of breath, abdominal pain   Hospital Course / Discharge diagnoses: Principal Problem Acute/subacute CVA, acute metabolic encephalopathy -patient was found to have a stroke on the MRI, neurology consulted.  MRI of the brain showed an acute to subacute infarct of the left thalamus and chronic lacunar infarct of the contralateral right thalamus that is new since January of last year, also several subacute to early chronic small bilateral white matter infarcts.  CT angiogram showed atherosclerotic  disease at both carotid bifurcations without stenosis, 20% stenosis of the proximal right subclavian artery with widely patent vertebral arteries.  LDL was 92, she is on a statin.  A1c is 5.3.  TSH, B12 normal.  2D echo showed EF 55-60%, normal LVEF, no WMA.  RV systolic function was mildly reduced, RV size was normal.  PT recommending home health PT  Active Problems Abnormal UA -patient with leukocytes in the UA, not clear whether she is having symptoms given underlying confusion.  Received 3 days of ceftriaxone.  Cultures with multiple species. Essential hypertension-continue home medications Rash - unclear cause but did develop after 3rd dose of Ceftriaxone, c/w drug reaction. Resolved with steroids. 3 day taper on dc PAF on chronic anticoagulation-continue metoprolol, Eliquis Aortic valve stenosis status post AVR-she has a bioprosthetic valve in place in 2014 Left shoulder arthritis- chronic, patient had an episode of severe left shoulder pain , and x-ray showed degenerative joint disease involving the left acromioclavicular and glenohumeral joints without acute abnormalities.  She also had an episode of neck pain as well as bilateral hand pain / burning, and a CT of the C-spine was without acute findings but did show multilevel degenerative changes without significant spinal canal stenosis, also moderate neural foraminal narrowing on the right at C5-6 and bilaterally at C7-T1.  Conservative management, outpatient follow-up Hyperlipidemia-continue statin CKD stage IIIa -creatinine at baseline History of COPD-no wheezing History of breast cancer-1998  Sepsis ruled out   Discharge Instructions   Allergies as of 06/27/2020      Reactions   Lisinopril Cough   Ceftriaxone Rash   Codeine Rash  All over the body.      Medication List    TAKE these medications   acetaminophen 650 MG CR tablet Commonly known as: TYLENOL Take 650 mg by mouth every morning.   acetaminophen 325 MG  tablet Commonly known as: TYLENOL Take 650 mg by mouth at bedtime as needed (pain).   ADVANCED COLLAGEN PO Take 1,000 mg by mouth daily.   albuterol 108 (90 Base) MCG/ACT inhaler Commonly known as: ProAir HFA INHALE 1 PUFF INTO THE LUNGS EVERY 6 (SIX) HOURS AS NEEDED FOR WHEEZING OR SHORTNESS OF BREATH. What changed:   how much to take  how to take this  when to take this  reasons to take this   amoxicillin 500 MG tablet Commonly known as: AMOXIL Take 4 by mouth 1 hour prior to dental procedure   atorvastatin 40 MG tablet Commonly known as: LIPITOR Take 1 tablet (40 mg total) by mouth daily.   CITRACAL + D PO Take 1 tablet by mouth 2 (two) times daily.   dextromethorphan-guaiFENesin 30-600 MG 12hr tablet Commonly known as: MUCINEX DM Take 1 tablet by mouth 2 (two) times daily as needed.   Eliquis 5 MG Tabs tablet Generic drug: apixaban TAKE 1 TABLET BY MOUTH TWICE DAILY. What changed: how much to take   EQ CHLORTABS PO Take 1 tablet by mouth every 4 (four) hours as needed.   famotidine 20 MG tablet Commonly known as: Pepcid One after supper What changed:   how much to take  how to take this  when to take this  additional instructions   FISH OIL PO Take 1 capsule by mouth daily.   fluticasone 50 MCG/ACT nasal spray Commonly known as: FLONASE PLACE 1 SPRAY INTO BOTH NOSTRILS TWICE DAILY AS NEEDED FOR ALLERGIES OR RHINITIS. What changed:   how much to take  how to take this  when to take this  reasons to take this   hydrOXYzine 25 MG tablet Commonly known as: ATARAX/VISTARIL Take one tablet by mouth daily as needed for itching. What changed:   how much to take  how to take this  when to take this  reasons to take this  additional instructions   metoprolol tartrate 25 MG tablet Commonly known as: LOPRESSOR TAKE 1 TABLET (25 MG TOTAL) BY MOUTH 2 (TWO) TIMES DAILY. What changed: Another medication with the same name was removed.  Continue taking this medication, and follow the directions you see here.   Myrbetriq 50 MG Tb24 tablet Generic drug: mirabegron ER TAKE 1 TABLET BY MOUTH ONCE DAILY. What changed:   how much to take  Another medication with the same name was removed. Continue taking this medication, and follow the directions you see here.   omeprazole 20 MG capsule Commonly known as: PRILOSEC Take 20 mg by mouth daily.   PHILLIPS MILK OF MAGNESIA PO Take 15 mL by mouth. Take daily at 6 o'clock at night   predniSONE 10 MG tablet Commonly known as: DELTASONE Take 1 tablet (10 mg total) by mouth daily for 3 days.   shark liver oil-cocoa butter 0.25-3-85.5 % suppository Commonly known as: PREPARATION H Place 1 suppository rectally as needed for hemorrhoids.   valsartan 160 MG tablet Commonly known as: DIOVAN TAKE 1 TABLET (160 MG TOTAL) BY MOUTH IN THE MORNING. What changed:   how much to take  how to take this  when to take this   valsartan 80 MG tablet Commonly known as: DIOVAN TAKE 1 TABLET (80 MG  TOTAL) BY MOUTH EVERY EVENING. What changed:   how much to take  how to take this  when to take this   vitamin C 100 MG tablet Take 100 mg by mouth daily.   vitamin E 180 MG (400 UNITS) capsule Take 400 Units by mouth daily.       Follow-up Information    [facility] Follow up.   Specialty: Home Health Services Why: The home health agency will contact you for the first home visit. Contact information: [place] [address] [phone]               Consultations:  Neurology   Procedures/Studies:  CT ANGIO HEAD W OR WO CONTRAST  Result Date: 06/24/2020 CLINICAL DATA:  Left-sided weakness beginning 48 hours ago. Some headache. Acute left thalamic infarction shown by MRI earlier today. Other scattered deep white matter infarctions in both hemispheres. EXAM: CT ANGIOGRAPHY HEAD AND NECK TECHNIQUE: Multidetector CT imaging of  the head and neck was performed using the standard protocol during bolus administration of intravenous contrast. Multiplanar CT image reconstructions and MIPs were obtained to evaluate the vascular anatomy. Carotid stenosis measurements (when applicable) are obtained utilizing NASCET criteria, using the distal internal carotid diameter as the denominator. CONTRAST:  68mL OMNIPAQUE IOHEXOL 350 MG/ML SOLN COMPARISON:  MRI same day.  CT yesterday. FINDINGS: CT HEAD FINDINGS Brain: No focal abnormality seen affecting the brainstem or cerebellum. Low-density now evident in the medial left thalamus consistent with the acute infarction. Old medial thalamic infarction seen on the right chronic appearing small vessel ischemic changes within the cerebral hemispheric white matter. No sign of new recent infarction, mass, hemorrhage, hydrocephalus or extra-axial collection. Vascular: There is atherosclerotic calcification of the major vessels at the base of the brain. Skull: Negative Sinuses: Clear Orbits: Normal Review of the MIP images confirms the above findings CTA NECK FINDINGS Aortic arch: Aortic atherosclerotic calcification. Branching pattern is normal without origin stenosis. Right carotid system: Common carotid artery widely patent to the bifurcation region. Calcified plaque at the carotid bifurcation and proximal ICA bulb. No narrowing of the ICA lumen beyond the diameter of the more distal cervical ICA, therefore no stenosis. Left carotid system: Common carotid artery widely patent to the bifurcation. Calcified plaque at the carotid bifurcation and proximal ICA bulb. No stenosis. Vertebral arteries: Right vertebral artery is dominant. Right proximal subclavian atherosclerotic disease just proximal to the right vertebral artery origin with stenosis of 20%. No vertebral artery origin stenosis however. Beyond the origin, this vessel is dominant and widely patent to the foramen magnum. Non dominant left vertebral artery  origin is widely patent. The vessel appears normal through the cervical region to the foramen magnum. Skeleton: Cervical spondylosis.  No acute finding. Other neck: No mass or lymphadenopathy. Upper chest: No active process. Calcified pleural plaque at both apices. Review of the MIP images confirms the above findings CTA HEAD FINDINGS Anterior circulation: Both internal carotid arteries are patent through the skull base and siphon regions. No siphon stenosis. The anterior and middle cerebral vessels are patent without proximal stenosis, aneurysm or vascular malformation. Azygos anterior cerebral artery. Patent bilateral posterior communicating arteries. More distal branch vessels show some atherosclerotic irregularity. Posterior circulation: Both vertebral arteries are patent through the foramen magnum to the basilar. No basilar stenosis. Posterior circulation branch vessels are patent. More distal branch vessels do show mild atherosclerotic irregularity. Venous sinuses: Patent normal. Anatomic variants: None other significant. Review of the MIP images  confirms the above findings IMPRESSION: 1. Atherosclerotic disease at both carotid bifurcations but without stenosis. 2. 20% stenosis of the proximal right subclavian artery. Both vertebral artery origins widely patent. No vertebrobasilar stenosis. No posterior circulation branch vessel occlusion identified. 3. Intracranial distal vessel atherosclerotic irregularity. No correctable proximal stenosis. No large or medium vessel occlusion. Aortic Atherosclerosis (ICD10-I70.0). Electronically Signed   By: Nelson Chimes M.D.   On: 06/24/2020 09:13   DG Chest 2 View  Result Date: 06/24/2020 CLINICAL DATA:  CVA. EXAM: CHEST - 2 VIEW COMPARISON:  Chest x-ray 08/22/2018. FINDINGS: Prior cardiac valve replacement. Cardiomegaly. No pulmonary venous congestion. No focal infiltrate. Mild elevation left hemidiaphragm. No pleural effusion or pneumothorax. Degenerative changes  lumbar spine and both shoulders. IMPRESSION: 1. Prior cardiac valve replacement. Cardiomegaly. No pulmonary venous congestion. 2.  Mild elevation left hemidiaphragm. Electronically Signed   By: Marcello Moores  Register   On: 06/24/2020 08:31   CT HEAD WO CONTRAST  Result Date: 06/23/2020 CLINICAL DATA:  Weakness worse on the left side. Headache. Symptoms began yesterday. EXAM: CT HEAD WITHOUT CONTRAST TECHNIQUE: Contiguous axial images were obtained from the base of the skull through the vertex without intravenous contrast. COMPARISON:  MRI brain 03/13/2019 FINDINGS: Brain: No evidence of acute infarction, hemorrhage, hydrocephalus, extra-axial collection or mass lesion/mass effect. Mild diffuse cerebral atrophy. Low-attenuation changes in the deep white matter suggesting small vessel ischemia. Vascular: Mild intracranial arterial vascular calcifications. Skull: The calvarium appears intact. Sinuses/Orbits: Paranasal sinuses and mastoid air cells are clear. Other: None. IMPRESSION: 1. No acute intracranial abnormality. 2. Mild diffuse cerebral atrophy and small vessel ischemia. Electronically Signed   By: Lucienne Capers M.D.   On: 06/23/2020 19:57   CT ANGIO NECK W OR WO CONTRAST  Result Date: 06/24/2020 CLINICAL DATA:  Left-sided weakness beginning 48 hours ago. Some headache. Acute left thalamic infarction shown by MRI earlier today. Other scattered deep white matter infarctions in both hemispheres. EXAM: CT ANGIOGRAPHY HEAD AND NECK TECHNIQUE: Multidetector CT imaging of the head and neck was performed using the standard protocol during bolus administration of intravenous contrast. Multiplanar CT image reconstructions and MIPs were obtained to evaluate the vascular anatomy. Carotid stenosis measurements (when applicable) are obtained utilizing NASCET criteria, using the distal internal carotid diameter as the denominator. CONTRAST:  85mL OMNIPAQUE IOHEXOL 350 MG/ML SOLN COMPARISON:  MRI same day.  CT yesterday.  FINDINGS: CT HEAD FINDINGS Brain: No focal abnormality seen affecting the brainstem or cerebellum. Low-density now evident in the medial left thalamus consistent with the acute infarction. Old medial thalamic infarction seen on the right chronic appearing small vessel ischemic changes within the cerebral hemispheric white matter. No sign of new recent infarction, mass, hemorrhage, hydrocephalus or extra-axial collection. Vascular: There is atherosclerotic calcification of the major vessels at the base of the brain. Skull: Negative Sinuses: Clear Orbits: Normal Review of the MIP images confirms the above findings CTA NECK FINDINGS Aortic arch: Aortic atherosclerotic calcification. Branching pattern is normal without origin stenosis. Right carotid system: Common carotid artery widely patent to the bifurcation region. Calcified plaque at the carotid bifurcation and proximal ICA bulb. No narrowing of the ICA lumen beyond the diameter of the more distal cervical ICA, therefore no stenosis. Left carotid system: Common carotid artery widely patent to the bifurcation. Calcified plaque at the carotid bifurcation and proximal ICA bulb. No stenosis. Vertebral arteries: Right vertebral artery is dominant. Right proximal subclavian atherosclerotic disease just proximal to the right vertebral artery origin with stenosis of 20%. No vertebral  artery origin stenosis however. Beyond the origin, this vessel is dominant and widely patent to the foramen magnum. Non dominant left vertebral artery origin is widely patent. The vessel appears normal through the cervical region to the foramen magnum. Skeleton: Cervical spondylosis.  No acute finding. Other neck: No mass or lymphadenopathy. Upper chest: No active process. Calcified pleural plaque at both apices. Review of the MIP images confirms the above findings CTA HEAD FINDINGS Anterior circulation: Both internal carotid arteries are patent through the skull base and siphon regions. No  siphon stenosis. The anterior and middle cerebral vessels are patent without proximal stenosis, aneurysm or vascular malformation. Azygos anterior cerebral artery. Patent bilateral posterior communicating arteries. More distal branch vessels show some atherosclerotic irregularity. Posterior circulation: Both vertebral arteries are patent through the foramen magnum to the basilar. No basilar stenosis. Posterior circulation branch vessels are patent. More distal branch vessels do show mild atherosclerotic irregularity. Venous sinuses: Patent normal. Anatomic variants: None other significant. Review of the MIP images confirms the above findings IMPRESSION: 1. Atherosclerotic disease at both carotid bifurcations but without stenosis. 2. 20% stenosis of the proximal right subclavian artery. Both vertebral artery origins widely patent. No vertebrobasilar stenosis. No posterior circulation branch vessel occlusion identified. 3. Intracranial distal vessel atherosclerotic irregularity. No correctable proximal stenosis. No large or medium vessel occlusion. Aortic Atherosclerosis (ICD10-I70.0). Electronically Signed   By: Nelson Chimes M.D.   On: 06/24/2020 09:13   MR BRAIN WO CONTRAST  Result Date: 06/24/2020 CLINICAL DATA:  87 year old female with recent onset weakness worse on the left side. Headache. EXAM: MRI HEAD WITHOUT CONTRAST TECHNIQUE: Multiplanar, multiecho pulse sequences of the brain and surrounding structures were obtained without intravenous contrast. COMPARISON:  Head CT 06/23/2020.  Brain MRI 03/13/2019. FINDINGS: Brain: 11 mm area of abnormal diffusion in the anterior left thalamus tracking medially, associated T2 and FLAIR hyperintensity, T1 hypointensity. No associated hemorrhage. No significant mass effect. Contralateral medial right thalamic chronic lacunar infarct is also new from last year but demonstrates cystic encephalomalacia on DWI. Additionally there are several small areas of bilateral white  matter T2 shine through on DWI affecting the right corona radiata (series 5, image 76) and left centrum semiovale in several places. Underlying Patchy and confluent bilateral white matter T2 and FLAIR hyperintensity appears not significantly changed from last year, as are occasional chronic micro hemorrhages in the bilateral white matter. Mild T2 heterogeneity in the pons and chronic appearing gliosis in the left cerebellar peduncle appear stable from last year. Basal ganglia remain within normal limits. No cortical encephalomalacia identified. No restricted midline shift, mass effect, evidence of mass lesion, ventriculomegaly, extra-axial collection or acute intracranial hemorrhage. Cervicomedullary junction and pituitary are within normal limits. Vascular: Major intracranial vascular flow voids are stable since last year. Skull and upper cervical spine: Stable visible cervical spine. Visualized bone marrow signal is within normal limits. Sinuses/Orbits: Stable and negative. Other: Mastoids remain well aerated. IMPRESSION: 1. Acute to subacute infarct in the left thalamus. No associated hemorrhage or mass effect. 2. A chronic lacunar infarct in the contralateral right thalamus is new since January last year. And there are several subacute to early chronic small bilateral white matter infarcts superimposed on advanced chronic white matter disease. Electronically Signed   By: Genevie Ann M.D.   On: 06/24/2020 07:16   MR CERVICAL SPINE WO CONTRAST  Result Date: 06/26/2020 CLINICAL DATA:  Cervical radiculopathy. EXAM: MRI CERVICAL SPINE WITHOUT CONTRAST TECHNIQUE: Multiplanar, multisequence MR imaging of the cervical spine was performed.  No intravenous contrast was administered. COMPARISON:  None. FINDINGS: Alignment: Small anterolisthesis of C3 over C4 and C7 over T1. Small retrolisthesis of C5 over C6. Vertebrae: No fracture, evidence of discitis, or bone lesion. Cord: Normal signal and morphology. Posterior Fossa,  vertebral arteries, paraspinal tissues: Small focus of gliosis in the left middle cerebellar peduncle better characterized on MRI of the brain performed on June 24, 2020. Disc levels: C2-3: Endplate degenerative changes the tiny posterior disc protrusion without significant spinal canal stenosis. Facet degenerative changes, left greater than. No significant neural foraminal narrowing. C3-4: Small posterior disc protrusion without significant spinal canal stenosis. Facet degenerative changes, left greater than right, without significant neural foraminal narrowing. C4-5: Small posterior disc protrusion without significant spinal canal stenosis. Uncovertebral and facet degenerative changes without significant neural foraminal narrowing. C5-6: Posterior disc osteophyte complex without significant spinal canal stenosis. Uncovertebral and facet degenerative changes resulting in moderate right neural foraminal narrowing. C6-7: Minimal posterior disc protrusion. No significant spinal canal or neural foraminal stenosis. C7-T1: Minimal disc uncovering without significant spinal canal stenosis. Uncovertebral and facet degenerative changes resulting in moderate bilateral neural foraminal narrowing. IMPRESSION: 1. Multilevel degenerative changes of the cervical spine as described above. 2. No significant spinal canal stenosis at any level. 3. Moderate neural foraminal narrowing on the right at C5-6 and bilaterally at C7-T1. Electronically Signed   By: Pedro Earls M.D.   On: 06/26/2020 11:08   DG Shoulder Left  Result Date: 06/26/2020 CLINICAL DATA:  Left shoulder pain without known injury. EXAM: LEFT SHOULDER - 2+ VIEW COMPARISON:  None. FINDINGS: There is no evidence of fracture or dislocation. Mild degenerative changes seen involving the left acromioclavicular joint. Mild narrowing of the left glenohumeral joint is noted with moderate osteophyte formation. Soft tissues are unremarkable. IMPRESSION:  Degenerative joint disease is seen involving the left acromioclavicular and glenohumeral joints. No acute abnormality is noted. Electronically Signed   By: Marijo Conception M.D.   On: 06/26/2020 10:44   ECHOCARDIOGRAM COMPLETE  Result Date: 06/24/2020    ECHOCARDIOGRAM REPORT   Patient Name:   [name] Date of Exam: 06/24/2020 Medical Rec #:  [id number]       Height:       64.0 in Accession #:    [id number]      Weight:       160.0 lb Date of Birth:  March 23, 1931       BSA:          1.779 m Patient Age:    44 years        BP:           156/87 mmHg Patient Gender: F               HR:           90 bpm. Exam Location:  Inpatient Procedure: 2D Echo Indications:    Stroke I63.9  History:        Patient has prior history of Echocardiogram examinations. COPD;                 Risk Factors:Hypertension and Dyslipidemia.                 Aortic Valve: 21 mm Edwards pericardial valve is present in the                 aortic position. Procedure Date: 08/30/2012.  Sonographer:    Mikki Santee RDCS (AE) Referring Phys: [id number] [place]  Sonographer Comments: Technically difficult study due to poor echo windows. Image acquisition challenging due to breast implants. IMPRESSIONS  1. Left ventricular ejection fraction, by estimation, is 55 to 60%. The left ventricle has normal function. The left ventricle has no regional wall motion abnormalities. There is mild concentric left ventricular hypertrophy. Left ventricular diastolic function could not be evaluated.  2. Right ventricular systolic function is mildly reduced. The right ventricular size is normal. There is normal pulmonary artery systolic pressure.  3. Left atrial size was mildly dilated.  4. The mitral valve is normal in structure. Trivial mitral valve regurgitation. No evidence of mitral stenosis.  5. Tricuspid valve regurgitation is mild to moderate.  6. The aortic valve has been repaired/replaced. Aortic valve regurgitation is trivial. No aortic stenosis is  present. There is a 21 mm Edwards pericardial valve present in the aortic position. Procedure Date: 08/30/2012. Echo findings are consistent with normal structure and function of the aortic valve prosthesis. Aortic valve mean gradient measures 8.8 mmHg. Aortic valve Vmax measures 2.23 m/s.  7. The inferior vena cava is normal in size with greater than 50% respiratory variability, suggesting right atrial pressure of 3 mmHg. Comparison(s): No significant change from prior study. Prior images reviewed side by side. FINDINGS  Left Ventricle: Left ventricular ejection fraction, by estimation, is 55 to 60%. The left ventricle has normal function. The left ventricle has no regional wall motion abnormalities. The left ventricular internal cavity size was normal in size. There is  mild concentric left ventricular hypertrophy. Abnormal (paradoxical) septal motion consistent with post-operative status. Left ventricular diastolic function could not be evaluated due to atrial fibrillation. Left ventricular diastolic function could not be evaluated. Right Ventricle: The right ventricular size is normal. No increase in right ventricular wall thickness. Right ventricular systolic function is mildly reduced. There is normal pulmonary artery systolic pressure. The tricuspid regurgitant velocity is 2.77 m/s, and with an assumed right atrial pressure of 3 mmHg, the estimated right ventricular systolic pressure is [id number] mmHg. Left Atrium: Left atrial size was mildly dilated. Right Atrium: Right atrial size was normal in size. Pericardium: There is no evidence of pericardial effusion. Mitral Valve: The mitral valve is normal in structure. Mild to moderate mitral annular calcification. Trivial mitral valve regurgitation. No evidence of mitral valve stenosis. Tricuspid Valve: The tricuspid valve is normal in structure. Tricuspid valve regurgitation is mild to moderate. No evidence of tricuspid stenosis. Aortic Valve: The aortic valve has been  repaired/replaced. Aortic valve regurgitation is trivial. No aortic stenosis is present. Aortic valve mean gradient measures 8.8 mmHg. Aortic valve peak gradient measures 19.9 mmHg. Aortic valve area, by VTI measures 0.76 cm. There is a 21 mm Edwards pericardial valve present in the aortic position. Procedure Date: 08/30/2012. Echo findings are consistent with normal structure and function of the aortic valve prosthesis. Pulmonic Valve: The pulmonic valve was normal in structure. Pulmonic valve regurgitation is not visualized. No evidence of pulmonic stenosis. Aorta: The aortic root is normal in size and structure. Venous: The inferior vena cava is normal in size with greater than 50% respiratory variability, suggesting right atrial pressure of 3 mmHg. IAS/Shunts: No atrial level shunt detected by color flow Doppler.  LEFT VENTRICLE PLAX 2D LVIDd:         3.60 cm LVIDs:         2.20 cm LV PW:         1.30 cm LV IVS:        1.30 cm LVOT  diam:     1.80 cm LV SV:         29 LV SV Index:   16 LVOT Area:     2.54 cm  RIGHT VENTRICLE RV S prime:     9.32 cm/s TAPSE (M-mode): 1.2 cm LEFT ATRIUM             Index       RIGHT ATRIUM           Index LA diam:        3.70 cm 2.08 cm/m  RA Area:     17.60 cm LA Vol (A2C):   90.3 ml 50.75 ml/m RA Volume:   40.20 ml  22.59 ml/m LA Vol (A4C):   63.1 ml 35.46 ml/m LA Biplane Vol: 77.6 ml 43.61 ml/m  AORTIC VALVE AV Area (Vmax):    0.64 cm AV Area (Vmean):   0.66 cm AV Area (VTI):     0.76 cm AV Vmax:           223.21 cm/s AV Vmean:          142.261 cm/s AV VTI:            0.387 m AV Peak Grad:      19.9 mmHg AV Mean Grad:      8.8 mmHg LVOT Vmax:         56.10 cm/s LVOT Vmean:        37.000 cm/s LVOT VTI:          0.115 m LVOT/AV VTI ratio: 0.30  AORTA Ao Root diam: 2.10 cm TRICUSPID VALVE TR Peak grad:   30.7 mmHg TR Vmax:        277.00 cm/s  SHUNTS Systemic VTI:  0.12 m Systemic Diam: 1.80 cm Mihai Croitoru MD Electronically signed by Sanda Klein MD Signature  Date/Time: 06/24/2020/3:56:42 PM    Final      Subjective: - no chest pain, shortness of breath, no abdominal pain, nausea or vomiting.   Discharge Exam: BP 125/67 (BP Location: Right Arm)   Pulse 61   Temp 97.8 F (36.6 C) (Oral)   Resp 19   Ht 5\' 4"  (1.626 m)   Wt 72.6 kg   SpO2 97%   BMI 27.46 kg/m   General: Pt is alert, awake, not in acute distress Cardiovascular: RRR, S1/S2 +, no rubs, no gallops Respiratory: CTA bilaterally, no wheezing, no rhonchi Abdominal: Soft, NT, ND, bowel sounds + Extremities: no edema, no cyanosis    The results of significant diagnostics from this hospitalization (including imaging, microbiology, ancillary and laboratory) are listed below for reference.     Microbiology: Recent Results (from the past 240 hour(s))  SARS CORONAVIRUS 2 (TAT 6-24 HRS) Nasopharyngeal Nasopharyngeal Swab     Status: None   Collection Time: 06/24/20  7:27 AM   Specimen: Nasopharyngeal Swab  Result Value Ref Range Status   SARS Coronavirus 2 NEGATIVE NEGATIVE Final    Comment: (NOTE) SARS-CoV-2 target nucleic acids are NOT DETECTED.  The SARS-CoV-2 RNA is generally detectable in upper and lower respiratory specimens during the acute phase of infection. Negative results do not preclude SARS-CoV-2 infection, do not rule out co-infections with other pathogens, and should not be used as the sole basis for treatment or other patient management decisions. Negative results must be combined with clinical observations, patient history, and epidemiological information. The expected result is Negative.  Fact Sheet for Patients: SugarRoll.be  Fact Sheet for Healthcare Providers: https://www.woods-mathews.com/  This test  is not yet approved or cleared by the [place] and  has been authorized for detection and/or diagnosis of SARS-CoV-2 by FDA under an Emergency Use Authorization (EUA). This EUA will remain  in effect  (meaning this test can be used) for the duration of the COVID-19 declaration under Se ction 564(b)(1) of the Act, 21 U.S.C. section 360bbb-3(b)(1), unless the authorization is terminated or revoked sooner.  Performed at [facility], [place] [address], [place], [place] [address]   Culture, Urine     Status: Abnormal   Collection Time: 06/24/20  7:54 AM   Specimen: Urine, Random  Result Value Ref Range Status   Specimen Description URINE, RANDOM  Final   Special Requests   Final    NONE Performed at [facility], [place] [address], [place], [place] [address]    Culture MULTIPLE SPECIES PRESENT, SUGGEST RECOLLECTION (A)  Final   Report Status 06/25/2020 FINAL  Final     Labs: Basic Metabolic Panel: Recent Labs  Lab 06/23/20 1933 06/23/20 1945 06/25/20 0203 06/26/20 0304  NA 136 139 138 135  K 3.8 3.8 3.8 3.9  CL 100 100 103 101  CO2 27  --  25 22  GLUCOSE 162* 160* 96 142*  BUN 17 19 13 22   CREATININE 1.14* 1.10* 0.95 1.08*  CALCIUM 9.6  --  9.5 9.2  MG  --   --  1.8  --    Liver Function Tests: Recent Labs  Lab 06/23/20 1933  AST 22  ALT 19  ALKPHOS 55  BILITOT 0.8  PROT 6.3*  ALBUMIN 3.8   CBC: Recent Labs  Lab 06/23/20 1933 06/23/20 1945 06/26/20 0304  WBC 6.5  --  14.0*  NEUTROABS 4.2  --   --   HGB 13.3 13.6 13.1  HCT 40.2 40.0 38.6  MCV 100.0  --  97.2  PLT 160  --  161   CBG: No results for input(s): GLUCAP in the last 168 hours. Hgb A1c No results for input(s): HGBA1C in the last 72 hours. Lipid Profile Recent Labs    06/24/20 1932  CHOL 169  HDL 66  LDLCALC 92  TRIG 54  CHOLHDL 2.6   Thyroid function studies Recent Labs    06/24/20 0940  TSH 2.220   Urinalysis    Component Value Date/Time   COLORURINE YELLOW 06/23/2020 1923   APPEARANCEUR CLEAR 06/23/2020 1923   LABSPEC 1.011 06/23/2020 1923   PHURINE 7.0 06/23/2020 1923   GLUCOSEU NEGATIVE 06/23/2020 1923   HGBUR NEGATIVE 06/23/2020 1923   BILIRUBINUR NEGATIVE  06/23/2020 1923   KETONESUR NEGATIVE 06/23/2020 1923   PROTEINUR NEGATIVE 06/23/2020 1923   UROBILINOGEN 0.2 08/28/2012 1523   NITRITE NEGATIVE 06/23/2020 1923   LEUKOCYTESUR LARGE (A) 06/23/2020 1923    FURTHER DISCHARGE INSTRUCTIONS:   Get Medicines reviewed and adjusted: Please take all your medications with you for your next visit with your Primary MD   Laboratory/radiological data: Please request your Primary MD to go over all hospital tests and procedure/radiological results at the follow up, please ask your Primary MD to get all Hospital records sent to his/her office.   In some cases, they will be blood work, cultures and biopsy results pending at the time of your discharge. Please request that your primary care M.D. goes through all the records of your hospital data and follows up on these results.   Also Note the following: If you experience worsening of your admission symptoms, develop  shortness of breath, life threatening emergency, suicidal or homicidal thoughts you must seek medical attention immediately by calling 911 or calling your MD immediately  if symptoms less severe.   You must read complete instructions/literature along with all the possible adverse reactions/side effects for all the Medicines you take and that have been prescribed to you. Take any new Medicines after you have completely understood and accpet all the possible adverse reactions/side effects.    Do not drive when taking Pain medications or sleeping medications (Benzodaizepines)   Do not take more than prescribed Pain, Sleep and Anxiety Medications. It is not advisable to combine anxiety,sleep and pain medications without talking with your primary care practitioner   Special Instructions: If you have smoked or chewed Tobacco  in the last 2 yrs please stop smoking, stop any regular Alcohol  and or any Recreational drug use.   Wear Seat belts while driving.   Please note: You were cared for by a  hospitalist during your hospital stay. Once you are discharged, your primary care physician will handle any further medical issues. Please note that NO REFILLS for any discharge medications will be authorized once you are discharged, as it is imperative that you return to your primary care physician (or establish a relationship with a primary care physician if you do not have one) for your post hospital discharge needs so that they can reassess your need for medications and monitor your lab values.  Time coordinating discharge: 40 minutes  SIGNED:  Marzetta Board, MD, PhD 06/27/2020, 7:59 AM

## 2020-06-26 NOTE — TOC Transition Note (Signed)
Transition of Care [facility]) - CM/SW Discharge Note   Patient Details  Name: [name] MRN: [id number] Date of Birth: April 24, 1931  Transition of Care [facility] CM/SW Contact:  Pollie Friar, RN Phone Number: 06/26/2020, 11:44 AM   Clinical Narrative:    Patient discharging home with [facility] services through [place]. Cory with [facility] aware of d/c home today.  Pt has needed supervision at home and transportation to home.   Final next level of care: Home w Home Health Services Barriers to Discharge: No Barriers Identified   Patient Goals and CMS Choice   CMS Medicare.gov Compare Post Acute Care list provided to:: Patient Choice offered to / list presented to : [place]  Discharge Placement                       Discharge Plan and Services   Discharge Planning Services: CM Consult Post Acute Care Choice: Home Health                    HH Arranged: PT,OT,Speech Therapy [place]: [place] Date [place]: 06/25/20   Representative spoke with at [place]: [place] ([place]) Interventions     Readmission Risk Interventions No flowsheet data found.

## 2020-06-26 NOTE — Progress Notes (Signed)
Physical Therapy Treatment Patient Details Name: [name] MRN: [id number] DOB: 02-08-1932 Today's Date: 06/26/2020    History of Present Illness Pt is an 87 y/o female admitted secondary to increased confusion. MRI revealing L thalamic CVA. 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 Cervical MRI showed degenerative changes, no significant stenosis. Pt to d/c 4/28 but developed rash, suspect drug-induced. PMH includes breast cancer, COPD, s/p AVR, CKD, a fib, HTN.   PT Comments    Pt progressing with mobility. Today's session focused on continued transfer and gait training with RW, pt requiring minA to stand. Pt internally distracted by BUE pain/discomfort from rash that is apparently spreading up arms and to trunk (MD and RN aware). Pt hopeful for return home soon; will have necessary DME and assist from family. Will continue to follow acutely.   Follow Up Recommendations  Home health PT;Supervision/Assistance - 24 hour     Equipment Recommendations  None recommended by PT    Recommendations for Other Services       Precautions / Restrictions Precautions Precautions: Fall;Other (comment) Precaution Comments: Rash on BUEs, trunk    Mobility  Bed Mobility Overal bed mobility: Needs Assistance Bed Mobility: Supine to Sit     Supine to sit: Min assist     General bed mobility comments: Pt struggling to power into sitting, requiring momentum and minA to elevate trunk    Transfers Overall transfer level: Needs assistance Equipment used: Rolling walker (2 wheeled) Transfers: Sit to/from Stand Sit to Stand: Min guard         General transfer comment: Increased time and effort preparing to stand, pt distracted by itching BUEs/rash, requiring cues for hand placement and minA for trunk elevation  Ambulation/Gait Ambulation/Gait assistance: Min guard Gait Distance (Feet): 100 Feet Assistive  device: Rolling walker (2 wheeled)   Gait velocity: Decreased   General Gait Details: Slow, guarded gait with RW and min guard for balance; pt with decrease grip on RW due to bilateral hand discomfort. Distance limited by fatigue   Stairs             Wheelchair Mobility    Modified Rankin (Stroke Patients Only)       Balance Overall balance assessment: Needs assistance Sitting-balance support: No upper extremity supported;Feet supported Sitting balance-Leahy Scale: Fair     Standing balance support: No upper extremity supported;During functional activity;Bilateral upper extremity supported Standing balance-Leahy Scale: Poor Standing balance comment: Reliant on UE support                            Cognition Arousal/Alertness: Awake/alert Behavior During Therapy: Flat affect Overall Cognitive Status: Impaired/Different from baseline Area of Impairment: Memory;Following commands;Safety/judgement;Awareness;Problem solving                     Memory: Decreased short-term memory Following Commands: Follows one step commands with increased time;Follows multi-step commands inconsistently Safety/Judgement: Decreased awareness of safety;Decreased awareness of deficits Awareness: Emergent Problem Solving: Slow processing;Decreased initiation;Difficulty sequencing;Requires verbal cues        Exercises      General Comments General comments (skin integrity, edema, etc.): Pt's daughter present and supportive      Pertinent Vitals/Pain Pain Assessment: Faces Faces Pain Scale: Hurts little more Pain Location: BUEs, especially L hand Pain Descriptors / Indicators: Burning;Discomfort Pain Intervention(s): Monitored during session (RN and MD present to look at rash)  Home Living                      Prior Function            PT Goals (current goals can now be found in the care plan section) Progress towards PT goals: Progressing toward  goals    Frequency    Min 4X/week      PT Plan Current plan remains appropriate    Co-evaluation              AM-PAC PT "6 Clicks" Mobility   Outcome Measure  Help needed turning from your back to your side while in a flat bed without using bedrails?: None Help needed moving from lying on your back to sitting on the side of a flat bed without using bedrails?: A Little Help needed moving to and from a bed to a chair (including a wheelchair)?: A Little Help needed standing up from a chair using your arms (e.g., wheelchair or bedside chair)?: A Little Help needed to walk in hospital room?: A Little Help needed climbing 3-5 steps with a railing? : A Little 6 Click Score: 19    End of Session Equipment Utilized During Treatment: Gait belt Activity Tolerance: Patient tolerated treatment well;Patient limited by fatigue;Patient limited by pain Patient left: in bed;with call bell/phone within reach;with family/visitor present Nurse Communication: Mobility status PT Visit Diagnosis: Unsteadiness on feet (R26.81);Muscle weakness (generalized) (M62.81)     Time: 1345-1410 PT Time Calculation (min) (ACUTE ONLY): 25 min  Charges:  $Gait Training: 8-22 mins $Therapeutic Activity: 8-22 mins                     Mabeline Caras, PT, DPT Acute Rehabilitation Services  Pager [phone] Office [place] 06/26/2020, 3:50 PM

## 2020-06-26 NOTE — Progress Notes (Signed)
Patient discharged earlier on however developed a rash initially on her arms now spreading to her back, arms, legs. Not sure about the cause, it does appear a drug induced rash. She has received toradol today for arthritic pain however the rash appears to precede toradol administration. The other new medication is ceftriaxone, received 3 doses. She got the antibiotic this morning and rash appeared 1-2 h afterwards. It is not clear that this is the etiology after all but will add to her allergy list as a rash  Due to new onset rash will hold dc, add prednisone, famotidine and keep overnight.  Gloyd Happ M. Cruzita Lederer, MD, PhD [facility]  Between 7 am - 7 pm you can contact me via Amion (for emergencies) or [place] (non urgent matters).  I am not available 7 pm - 7 am, please contact night coverage MD/APP via Amion

## 2020-06-26 NOTE — Care Management Important Message (Signed)
Important Message  Patient Details  Name: [name] MRN: [id number] Date of Birth: May 17, 1931   Medicare Important Message Given:  Yes     Lorena Benham 06/26/2020, 12:12 PM

## 2020-06-27 ENCOUNTER — Other Ambulatory Visit (HOSPITAL_COMMUNITY): Payer: Self-pay

## 2020-06-27 MED ORDER — PREDNISONE 10 MG PO TABS
10.0000 mg | ORAL_TABLET | Freq: Every day | ORAL | 0 refills | Status: AC
  Filled 2020-06-27: qty 3, 3d supply, fill #0

## 2020-06-27 MED ORDER — SENNOSIDES-DOCUSATE SODIUM 8.6-50 MG PO TABS
2.0000 | ORAL_TABLET | Freq: Once | ORAL | Status: AC
  Administered 2020-06-27: 2 via ORAL
  Filled 2020-06-27: qty 2

## 2020-06-27 MED ORDER — POLYETHYLENE GLYCOL 3350 17 G PO PACK
17.0000 g | PACK | Freq: Every day | ORAL | Status: DC
  Administered 2020-06-27: 17 g via ORAL
  Filled 2020-06-27: qty 1

## 2020-06-27 MED ORDER — MILK AND MOLASSES ENEMA
1.0000 | Freq: Once | RECTAL | Status: AC
  Administered 2020-06-27: 240 mL via RECTAL
  Filled 2020-06-27: qty 240

## 2020-06-27 NOTE — Progress Notes (Signed)
OT Cancellation Note  Patient Details Name: [name] MRN: [id number] DOB: [birth year]   Cancelled Treatment:    Reason Eval/Treat Not Completed: RN in to give pt enema right as I was getting started with her, will return as schedule allows.  Golden Circle, OTR/L Acute Rehab Services Pager [phone] Office [phone]     Almon Register 06/27/2020, 11:29 AM

## 2020-06-27 NOTE — Progress Notes (Signed)
Pt has been discharged from the unit. All belongings has been sent with the pt. AVS has been given and reviewed. Son and daughter at bedside. Pt sent in good spirits

## 2020-06-27 NOTE — Progress Notes (Signed)
Occupational Therapy Treatment Patient Details Name: [name] MRN: [id number] DOB: November 02, 1931 Today's Date: 06/27/2020    History of present illness Pt is an 87 y/o female admitted secondary to increased confusion. MRI revealing L thalamic CVA. 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 Cervical MRI showed degenerative changes, no significant stenosis. Pt to d/c 4/28 but developed rash, suspect drug-induced. PMH includes breast cancer, COPD, s/p AVR, CKD, a fib, HTN.   OT comments  This 87 yo female admitted with above seen today with original plan to do pillbox test with her; however pt recently with enema and needed to go to bathroom which she needed min A for transfer (sit<>stand) and min A to get thoroughly cleaned. She also had difficulty with finding soap and paper towels at sink to wash hands. Son in room and I re-iterated to him that pt should not be driving, should be supervised with cooking and medication management--he verbalized understanding. Pt will continue to benefit from acute OT with follow up [place],   Follow Up Recommendations  Home health OT;Supervision/Assistance - 24 hour;Other (comment) (HHRN/SW for medication management and home situation)    Equipment Recommendations  3 in 1 bedside commode       Precautions / Restrictions Precautions Precautions: Fall Restrictions Weight Bearing Restrictions: No       Mobility Bed Mobility Overal bed mobility: Needs Assistance Bed Mobility: Sit to Supine       Sit to supine: Supervision;HOB elevated        Transfers Overall transfer level: Needs assistance Equipment used: Rolling walker (2 wheeled) Transfers: Sit to/from Stand Sit to Stand: Min assist         General transfer comment: from toilet with use of grab bar on left    Balance Overall balance assessment: Needs assistance Sitting-balance support: No upper extremity  supported;Feet supported Sitting balance-Leahy Scale: Good     Standing balance support: No upper extremity supported;During functional activity Standing balance-Leahy Scale: Fair Standing balance comment: standing at sink to wash hands                           ADL either performed or assessed with clinical judgement   ADL Overall ADL's : Needs assistance/impaired     Grooming: Min guard;Standing;Wash/dry hands Grooming Details (indicate cue type and reason): VCs to turn walker with her instead of putting off to the side                 Toilet Transfer: RW;Regular Toilet;Grab bars;Minimal assistance   Toileting- Clothing Manipulation and Hygiene: Minimal assistance;Sit to/from stand Toileting - Clothing Manipulation Details (indicate cue type and reason): from lower toilet and to get completely clean post bowel movement; pt recently had enema and every time she would wipe she would have a smear on toilet paper or washcloth             Vision Baseline Vision/History: Wears glasses Wears Glasses: At all times Patient Visual Report: No change from baseline            Cognition Arousal/Alertness: Awake/alert Behavior During Therapy: Flat affect Overall Cognitive Status: Impaired/Different from baseline Area of Impairment: Safety/judgement;Problem solving                         Safety/Judgement: Decreased awareness of safety   Problem Solving: Requires verbal cues;Requires tactile cues General  Comments: Pt needed VCs to use soap and find paper towels while standing to wash hands at sink post going to bathroom                   Pertinent Vitals/ Pain       Pain Location: bottom from enema and bowel movement Pain Descriptors / Indicators: Sore;Squeezing Pain Intervention(s): Limited activity within patient's tolerance;Monitored during session;Repositioned         Frequency  Min 2X/week        Progress Toward Goals  OT  Goals(current goals can now be found in the care plan section)  Progress towards OT goals: Progressing toward goals  Acute Rehab OT Goals Patient Stated Goal: to have a good bowel movement and go home OT Goal Formulation: With patient Time For Goal Achievement: 07/09/20 Potential to Achieve Goals: Good  Plan Discharge plan needs to be updated       AM-PAC OT "6 Clicks" Daily Activity     Outcome Measure   Help from another person eating meals?: None Help from another person taking care of personal grooming?: A Little Help from another person toileting, which includes using toliet, bedpan, or urinal?: A Little Help from another person bathing (including washing, rinsing, drying)?: A Little Help from another person to put on and taking off regular upper body clothing?: A Little Help from another person to put on and taking off regular lower body clothing?: A Little 6 Click Score: 19    End of Session Equipment Utilized During Treatment: Gait belt;Rolling walker  OT Visit Diagnosis: Other abnormalities of gait and mobility (R26.89);Muscle weakness (generalized) (M62.81);Other symptoms and signs involving cognitive function   Activity Tolerance Patient tolerated treatment well   Patient Left in bed;with call bell/phone within reach;with bed alarm set;with family/visitor present   Nurse Communication  (pt with only minimal bowel movement, son wants to make sure he knows all of patient's meds she needs to take at home)      Time: 3329-5188 OT Time Calculation (min): 58 min  Charges: OT General Charges $OT Visit: 1 Visit OT Treatments $Self Care/Home Management : 53-67 mins  Golden Circle, OTR/L Acute Rehab Services Pager [phone] Office [phone]      [name] 06/27/2020, 1:58 PM

## 2020-06-27 NOTE — Progress Notes (Signed)
Physical Therapy Treatment Patient Details Name: [name] MRN: [id number] DOB: 11/28/1931 Today's Date: 06/27/2020    History of Present Illness Pt is an 87 y/o female admitted secondary to increased confusion. MRI revealing L thalamic CVA. 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 Cervical MRI showed degenerative changes, no significant stenosis. Pt to d/c 4/28 but developed rash, suspect drug-induced. PMH includes breast cancer, COPD, s/p AVR, CKD, a fib, HTN.   PT Comments    Pt progressing with mobility. Noted significant improvement in rash, pt reports improved pain but still residual L forearm pain. Today's session focused on gait training with RW and stair training; pt able to ascend/descend steps with rail support and minA for HHA. Pt's son present, educ on safe guarding technique, gait belt provided. Pt hopeful for d/c home today. Recommend follow-up with HHPT services.    Follow Up Recommendations  Home health PT;Supervision for mobility/OOB     Equipment Recommendations  None recommended by PT    Recommendations for Other Services       Precautions / Restrictions Precautions Precautions: Fall Restrictions Weight Bearing Restrictions: No    Mobility  Bed Mobility Overal bed mobility: Needs Assistance Bed Mobility: Supine to Sit     Supine to sit: Min assist;HOB elevated     General bed mobility comments: MinA for HHA to elevate trunk, increased time and effort to scoot hips to EOB    Transfers Overall transfer level: Needs assistance Equipment used: Rolling walker (2 wheeled) Transfers: Sit to/from Stand Sit to Stand: Min assist;Min guard         General transfer comment: Initial minA to stand from EOB to RW, cues for hand placement; additional sit<>stands from transport chair with min guard  Ambulation/Gait Ambulation/Gait assistance: Min guard Gait Distance (Feet):  140 Feet Assistive device: Rolling walker (2 wheeled) Gait Pattern/deviations: Step-through pattern;Decreased stride length Gait velocity: Decreased   General Gait Details: Slow, guarded gait with RW and intermittent min guard for balance; verbal cues to maintain closer proximity to RW and for upright posture   Stairs Stairs: Yes Stairs assistance: Min assist;Min guard Stair Management: One rail Right;Step to pattern;Forwards Number of Stairs: 2 General stair comments: Ascend/descend 2 steps with RUE support on hand rail and light minA for LUE HHA, min guard for balance   Wheelchair Mobility    Modified Rankin (Stroke Patients Only) Modified Rankin (Stroke Patients Only) Pre-Morbid Rankin Score: No significant disability Modified Rankin: Moderately severe disability     Balance Overall balance assessment: Needs assistance Sitting-balance support: No upper extremity supported;Feet supported Sitting balance-Leahy Scale: Fair     Standing balance support: No upper extremity supported;During functional activity;Bilateral upper extremity supported Standing balance-Leahy Scale: Fair Standing balance comment: Can static stand without UE support, unable to accept challenge; static and dynamic stability improved with UE support                            Cognition Arousal/Alertness: Awake/alert Behavior During Therapy: Flat affect Overall Cognitive Status: Impaired/Different from baseline Area of Impairment: Memory;Following commands;Safety/judgement;Awareness;Problem solving                     Memory: Decreased short-term memory Following Commands: Follows one step commands with increased time;Follows multi-step commands inconsistently Safety/Judgement: Decreased awareness of safety;Decreased awareness of deficits Awareness: Emergent Problem Solving: Slow processing;Decreased initiation;Difficulty sequencing;Requires verbal cues  Exercises       General Comments General comments (skin integrity, edema, etc.): Son present and supportive, educ on technique for guarding/assist for stairs; gait belt provided      Pertinent Vitals/Pain Pain Assessment: Faces Faces Pain Scale: Hurts a little bit Pain Location: L forearm Pain Descriptors / Indicators: Burning Pain Intervention(s): Monitored during session    Home Living                      Prior Function            PT Goals (current goals can now be found in the care plan section) Progress towards PT goals: Progressing toward goals    Frequency    Min 4X/week      PT Plan Current plan remains appropriate    Co-evaluation              AM-PAC PT "6 Clicks" Mobility   Outcome Measure  Help needed turning from your back to your side while in a flat bed without using bedrails?: None Help needed moving from lying on your back to sitting on the side of a flat bed without using bedrails?: A Little Help needed moving to and from a bed to a chair (including a wheelchair)?: A Little Help needed standing up from a chair using your arms (e.g., wheelchair or bedside chair)?: A Little Help needed to walk in hospital room?: A Little Help needed climbing 3-5 steps with a railing? : A Little 6 Click Score: 19    End of Session Equipment Utilized During Treatment: Gait belt Activity Tolerance: Patient tolerated treatment well Patient left: in bed;with call bell/phone within reach;with family/visitor present Nurse Communication: Mobility status PT Visit Diagnosis: Unsteadiness on feet (R26.81);Muscle weakness (generalized) (M62.81)     Time: 6063-0160 PT Time Calculation (min) (ACUTE ONLY): 28 min  Charges:  $Gait Training: 23-37 mins                     Mabeline Caras, PT, DPT Acute Rehabilitation Services  Pager [phone] Office [place] 06/27/2020, 9:19 AM

## 2020-06-30 ENCOUNTER — Telehealth: Payer: Self-pay | Admitting: *Deleted

## 2020-06-30 ENCOUNTER — Other Ambulatory Visit (HOSPITAL_COMMUNITY): Payer: Self-pay

## 2020-06-30 NOTE — Telephone Encounter (Signed)
Transition Care Management Follow-Up Telephone Call   Date discharged and where:06/26/2020 [facility]  How have you been since you were released from the hospital?Doing Better, still confused   Any patient concerns? No  Items Reviewed:   Meds: Yes  Allergies:Yes  Dietary Changes Reviewed:Yes  Functional Questionnaire:  Independent-I Dependent-D  ADLs:I with assistance. Rolling Walker and son, [name], lives at home with patient.    Dressing-  I    Eating-I   Maintaining continence-I   Transferring-I With Assistance   Transportation-D   Meal Prep-D   Managing Meds- I  Confirmed importance and Date/Time of follow-up visits scheduled:07/09/2020 with Janett Billow (could not do sooner due to daughter's daughter graduating College)   Confirmed with patient if condition worsens to call PCP or go to the Emergency Dept. Patient was given office number and encouraged to call back with questions or concerns: Yes

## 2020-07-01 ENCOUNTER — Other Ambulatory Visit (HOSPITAL_COMMUNITY): Payer: Self-pay

## 2020-07-01 ENCOUNTER — Other Ambulatory Visit: Payer: Self-pay

## 2020-07-01 ENCOUNTER — Encounter: Payer: Self-pay | Admitting: Nurse Practitioner

## 2020-07-01 ENCOUNTER — Ambulatory Visit (INDEPENDENT_AMBULATORY_CARE_PROVIDER_SITE_OTHER): Payer: Medicare Other | Admitting: Nurse Practitioner

## 2020-07-01 VITALS — BP 140/70 | HR 92 | Temp 97.8°F | Ht 64.0 in | Wt 148.8 lb

## 2020-07-01 DIAGNOSIS — I480 Paroxysmal atrial fibrillation: Secondary | ICD-10-CM | POA: Diagnosis not present

## 2020-07-01 DIAGNOSIS — K5901 Slow transit constipation: Secondary | ICD-10-CM | POA: Diagnosis not present

## 2020-07-01 DIAGNOSIS — M5030 Other cervical disc degeneration, unspecified cervical region: Secondary | ICD-10-CM | POA: Diagnosis not present

## 2020-07-01 DIAGNOSIS — Z8673 Personal history of transient ischemic attack (TIA), and cerebral infarction without residual deficits: Secondary | ICD-10-CM

## 2020-07-01 DIAGNOSIS — I10 Essential (primary) hypertension: Secondary | ICD-10-CM | POA: Diagnosis not present

## 2020-07-01 DIAGNOSIS — R413 Other amnesia: Secondary | ICD-10-CM | POA: Diagnosis not present

## 2020-07-01 DIAGNOSIS — J449 Chronic obstructive pulmonary disease, unspecified: Secondary | ICD-10-CM

## 2020-07-01 DIAGNOSIS — K219 Gastro-esophageal reflux disease without esophagitis: Secondary | ICD-10-CM | POA: Diagnosis not present

## 2020-07-01 MED ORDER — VALSARTAN 160 MG PO TABS
160.0000 mg | ORAL_TABLET | Freq: Every morning | ORAL | 1 refills | Status: DC
  Filled 2020-07-01 – 2020-10-01 (×2): qty 90, 90d supply, fill #0
  Filled 2020-10-28 – 2020-12-30 (×2): qty 90, 90d supply, fill #1

## 2020-07-01 MED ORDER — VALSARTAN 80 MG PO TABS
80.0000 mg | ORAL_TABLET | Freq: Every evening | ORAL | 1 refills | Status: DC
  Filled 2020-07-01 – 2020-10-28 (×2): qty 90, 90d supply, fill #0
  Filled 2021-02-13: qty 90, 90d supply, fill #1

## 2020-07-01 MED ORDER — OMEPRAZOLE 20 MG PO CPDR
20.0000 mg | DELAYED_RELEASE_CAPSULE | Freq: Every day | ORAL | 1 refills | Status: DC
  Filled 2020-07-01: qty 90, 90d supply, fill #0

## 2020-07-01 NOTE — Patient Instructions (Signed)
Add miralax 17 gm daily if she starts having loose stools hold miralax do not give medication to stop the bowel movements.

## 2020-07-01 NOTE — Progress Notes (Signed)
Careteam: Patient Care Team: Bonnie Chandler, NP as PCP - General (Geriatric Medicine) Druscilla Brownie, MD as Consulting Physician (Dermatology) [name] Irani, MD (Inactive) as Consulting Physician (Gastroenterology) Neldon Mc, MD as Consulting Physician (General Surgery) Lorretta Harp, MD as Consulting Physician (Cardiology) Bjorn Loser, MD as Consulting Physician (Urology) Lavell Anchors, MD as Consulting Physician (Ophthalmology) Pieter Partridge, DO as Consulting Physician (Neurology)  PLACE OF SERVICE:  [place]  Advanced Directive information    Allergies  Allergen Reactions  . Lisinopril Cough  . Ceftriaxone Rash  . Codeine Rash    All over the body.    Chief Complaint  Patient presents with  . [facility] follow up visit. Son has concerns about mother sleeping a lot more. Patient is still having confusion. Working on Midwife. Son is trying to get her to eat.      HPI: Patient is a 87 y.o. female for hospital follow up.  hyperlipidemia, paroxysmal atrial fibrillation on anticoagulation, aortic stenosis, COPD,remote history of breast cancer status post resection,and PVD who presented to the ED with reports of confusion. She was found to have a stroke on the MRI, neurology consulted. MRI of the brain showed an acute to subacute infarct of the left thalamus and chronic lacunar infarct of the contralateral right thalamus that is new since January of last year, also several subacute to early chronic small bilateral white matter infarcts.CT angiogram showed atherosclerotic disease at both carotid bifurcations without stenosis.  Also noted to have UTI and was given 3 days of ceftriaxone.   Noted to have left shoulder and neck pain during hospitalization no acute findings on imaging but multilevel degenerative changes.   Therapy has been consulted. She wanted a female and they had to find her one.  Speech is  back to baseline.  Continues to be weak She continues to be confused in memory and conversation. Does not want to eat but son encourages her.  Son in home with her a lot of the day.  Son is helping her with the medication. He had a hard time getting her medication together.    Review of Systems:  Review of Systems  Constitutional: Positive for malaise/fatigue. Negative for chills, fever and weight loss.  HENT: Negative for tinnitus.   Respiratory: Negative for cough, sputum production and shortness of breath.   Cardiovascular: Negative for chest pain, palpitations and leg swelling.  Gastrointestinal: Positive for constipation. Negative for abdominal pain, diarrhea and heartburn.  Genitourinary: Negative for dysuria, frequency and urgency.  Musculoskeletal: Negative for back pain, falls, joint pain and myalgias.  Skin: Negative.   Neurological: Negative for dizziness and headaches.  Psychiatric/Behavioral: Positive for memory loss. Negative for depression. The patient does not have insomnia.     Past Medical History:  Diagnosis Date  . Aortic stenosis 07/20/2012   Aortic stenosis   . Aortic valve disorder 08/13/2011   ECHO - EF >76%; mild diastolic dysfunction; calcified aortic valve, not well visualized; mod/severe aortic stenosis w/ worsening gradients when compared to 2012  . Arthritis   . Cancer ([place])    Breast  . Carotid bruit 08/06/2008   Doppler - R ECA demonstrates noarrowing w/ elevated velocities consistent w/ >70% diameter reduction; R and L ICAs show no evidence of diameter reduction, significant tortuosity or vascular abnormality;   . Change in voice   . COPD (chronic obstructive pulmonary disease) ([place])   . Hearing loss   . Heart  murmur   . Hyperlipidemia   . Hypertension    dr Gwenlyn Found  . Osteoporosis   . PAF (paroxysmal atrial fibrillation) ([place]) 09/04/2012  . PVD (peripheral vascular disease) ([place]) 08/13/2010   R/P MV - normal pattern of perfusion in all regions, EF  76%; no significant wall abnormalities noted; normal perfusion study  . Right carotid bruit    high-grade right external carotid artery stenosis by 2 parts ultrasound 2 years ago  . S/P aortic valve replacement with bioprosthetic valve 08/30/2012   21 mm [facility] Ease bovine pericardial tissue valve  . Stroke (cerebrum) [facility])    Past Surgical History:  Procedure Laterality Date  . ABDOMINAL HYSTERECTOMY     Partial  . AORTIC VALVE REPLACEMENT N/A 08/30/2012   Procedure: AORTIC VALVE REPLACEMENT (AVR);  Surgeon: Rexene Alberts, MD;  Location: [place];  Service: Open Heart Surgery;  Laterality: N/A;  . BIOPSY SHOULDER Left 10/08/2005   shave biopsy  . BREAST LUMPECTOMY  02/25/1997   right  . CARDIAC CATHETERIZATION    . [place]  . COSMETIC SURGERY Left 1997   Breast implant  . EYE SURGERY  1997   Cataract surgery  . INTRAOPERATIVE TRANSESOPHAGEAL ECHOCARDIOGRAM N/A 08/30/2012   Procedure: INTRAOPERATIVE TRANSESOPHAGEAL ECHOCARDIOGRAM;  Surgeon: Rexene Alberts, MD;  Location: [facility];  Service: Open Heart Surgery;  Laterality: N/A;  . LEFT AND RIGHT HEART CATHETERIZATION WITH CORONARY ANGIOGRAM N/A 08/14/2012   Procedure: LEFT AND RIGHT HEART CATHETERIZATION WITH CORONARY ANGIOGRAM;  Surgeon: Lorretta Harp, MD;  Location: [facility] CATH LAB;  Service: Cardiovascular;  Laterality: N/A;  . LEFT HEART CATH  08/14/12   Nl cors, AS  . MASTECTOMY  09/29/95   left  . PARTIAL HYSTERECTOMY    . TOTAL HIP ARTHROPLASTY  09/2010   Social History:   reports that she has never smoked. She has never used smokeless tobacco. She reports that she does not drink alcohol and does not use drugs.  Family History  Problem Relation Age of Onset  . Cancer Mother        Bladder  . Early death Father        Tractor accident  . Early death Brother        Radiation protection practitioner  . Early death Brother        during heart surgery    Medications: Patient's Medications  New Prescriptions   No medications on file   Previous Medications   ACETAMINOPHEN (TYLENOL) 325 MG TABLET    Take 650 mg by mouth at bedtime as needed (pain).   ACETAMINOPHEN (TYLENOL) 650 MG CR TABLET    Take 650 mg by mouth every morning.   ALBUTEROL (PROAIR HFA) 108 (90 BASE) MCG/ACT INHALER    INHALE 1 PUFF INTO THE LUNGS EVERY 6 (SIX) HOURS AS NEEDED FOR WHEEZING OR SHORTNESS OF BREATH.   AMOXICILLIN (AMOXIL) 500 MG TABLET    Take 4 by mouth 1 hour prior to dental procedure   APIXABAN (ELIQUIS) 5 MG TABS TABLET    TAKE 1 TABLET BY MOUTH TWICE DAILY.   ASCORBIC ACID (VITAMIN C) 100 MG TABLET    Take 100 mg by mouth daily.   ATORVASTATIN (LIPITOR) 40 MG TABLET    Take 1 tablet (40 mg total) by mouth daily.   CALCIUM CITRATE-VITAMIN D (CITRACAL + D PO)    Take 1 tablet by mouth 2 (two) times daily.    CHLORPHENIRAMINE MALEATE (EQ CHLORTABS PO)  Take 1 tablet by mouth every 4 (four) hours as needed.   DEXTROMETHORPHAN-GUAIFENESIN (MUCINEX DM) 30-600 MG 12HR TABLET    Take 1 tablet by mouth 2 (two) times daily as needed.    FAMOTIDINE (PEPCID) 20 MG TABLET    One after supper   FLUTICASONE (FLONASE) 50 MCG/ACT NASAL SPRAY    PLACE 1 SPRAY INTO BOTH NOSTRILS TWICE DAILY AS NEEDED FOR ALLERGIES OR RHINITIS.   HYDROXYZINE (ATARAX/VISTARIL) 25 MG TABLET    Take one tablet by mouth daily as needed for itching.   MAGNESIUM HYDROXIDE (PHILLIPS MILK OF MAGNESIA PO)    Take 15 mL by mouth. Take daily at 6 o'clock at night   METOPROLOL TARTRATE (LOPRESSOR) 25 MG TABLET    TAKE 1 TABLET (25 MG TOTAL) BY MOUTH 2 (TWO) TIMES DAILY.   MIRABEGRON ER (MYRBETRIQ) 50 MG TB24 TABLET    TAKE 1 TABLET BY MOUTH ONCE DAILY.   OMEGA-3 FATTY ACIDS (FISH OIL PO)    Take 1 capsule by mouth daily.   OMEPRAZOLE (PRILOSEC) 20 MG CAPSULE    Take 20 mg by mouth daily.   SHARK LIVER OIL-COCOA BUTTER (PREPARATION H) 0.25-3-85.5 % SUPPOSITORY    Place 1 suppository rectally as needed for hemorrhoids.   SPECIALTY VITAMINS PRODUCTS (ADVANCED COLLAGEN PO)    Take 1,000 mg  by mouth daily.   VALSARTAN (DIOVAN) 160 MG TABLET    TAKE 1 TABLET (160 MG TOTAL) BY MOUTH IN THE MORNING.   VALSARTAN (DIOVAN) 80 MG TABLET    TAKE 1 TABLET (80 MG TOTAL) BY MOUTH EVERY EVENING.   VITAMIN E 400 UNIT CAPSULE    Take 400 Units by mouth daily.  Modified Medications   No medications on file  Discontinued Medications   No medications on file    Physical Exam:  Vitals:   07/01/20 1108  BP: 140/70  Pulse: 92  Temp: 97.8 F (36.6 C)  TempSrc: Temporal  SpO2: 98%  Weight: 148 lb 12.8 oz (67.5 kg)  Height: 5\' 4"  (1.626 m)   Body mass index is 25.54 kg/m. Wt Readings from Last 3 Encounters:  07/01/20 148 lb 12.8 oz (67.5 kg)  06/23/20 160 lb (72.6 kg)  04/02/20 151 lb 9.6 oz (68.8 kg)    Physical Exam Constitutional:      General: She is not in acute distress.    Appearance: She is well-developed. She is not diaphoretic.  HENT:     Head: Normocephalic and atraumatic.     Mouth/Throat:     Pharynx: No oropharyngeal exudate.  Eyes:     Conjunctiva/sclera: Conjunctivae normal.     Pupils: Pupils are equal, round, and reactive to light.  Cardiovascular:     Rate and Rhythm: Normal rate and regular rhythm.     Heart sounds: Normal heart sounds.  Pulmonary:     Effort: Pulmonary effort is normal.     Breath sounds: Normal breath sounds.  Abdominal:     General: Bowel sounds are normal.     Palpations: Abdomen is soft.  Musculoskeletal:        General: No tenderness.     Cervical back: Normal range of motion and neck supple.  Skin:    General: Skin is warm and dry.  Neurological:     Mental Status: She is alert and oriented to person, place, and time.    Labs reviewed: Basic Metabolic Panel: Recent Labs    06/23/20 1933 06/23/20 1945 06/24/20 0940 06/25/20 0203 06/26/20 0304  NA 136 139  --  138 135  K 3.8 3.8  --  3.8 3.9  CL 100 100  --  103 101  CO2 27  --   --  25 22  GLUCOSE 162* 160*  --  96 142*  BUN 17 19  --  13 22  CREATININE 1.14*  1.10*  --  0.95 1.08*  CALCIUM 9.6  --   --  9.5 9.2  MG  --   --   --  1.8  --   TSH  --   --  2.220  --   --    Liver Function Tests: Recent Labs    06/23/20 1933  AST 22  ALT 19  ALKPHOS 55  BILITOT 0.8  PROT 6.3*  ALBUMIN 3.8   No results for input(s): LIPASE, AMYLASE in the last 8760 hours. No results for input(s): AMMONIA in the last 8760 hours. CBC: Recent Labs    11/12/19 1557 04/02/20 1634 06/23/20 1933 06/23/20 1945 06/26/20 0304  WBC 7.5 7.7 6.5  --  14.0*  NEUTROABS 4,680 4,458 4.2  --   --   HGB 12.6 12.5 13.3 13.6 13.1  HCT 37.0 36.6 40.2 40.0 38.6  MCV 98.7 98.1 100.0  --  97.2  PLT 168 164 160  --  161   Lipid Panel: Recent Labs    06/24/20 1932  CHOL 169  HDL 66  LDLCALC 92  TRIG 54  CHOLHDL 2.6   TSH: Recent Labs    06/24/20 0940  TSH 2.220   A1C: Lab Results  Component Value Date   HGBA1C 5.3 06/24/2020     Assessment/Plan 1. PAF (paroxysmal atrial fibrillation) (HCC) -rate controlled on metoprolol SR today. Continues on eliquis   2. Essential hypertension -stable, will continue current regimen at this time. Continue low sodium diet.  - valsartan (DIOVAN) 160 MG tablet; Take 1 tablet (160 mg total) by mouth in the morning.  Dispense: 90 tablet; Refill: 1 - valsartan (DIOVAN) 80 MG tablet; Take 1 tablet (80 mg total) by mouth every evening.  Dispense: 90 tablet; Refill: 1  3. Gastroesophageal reflux disease without esophagitis -stable on current medication with dietary modifications. - omeprazole (PRILOSEC) 20 MG capsule; Take 1 capsule (20 mg total) by mouth daily.  Dispense: 90 capsule; Refill: 1  4. History of CVA (cerebrovascular accident) -now home with pt/ot and ST Family reports she is doing well. Encouraged routine, getting OOB and promoting increase activity  5. Slow transit constipation To stay well hydrated, to add miralax 17 gm daily to help with stools.   6. Memory loss After CVA, working with ST, encouraged  activities to stimulate her mind.   7. Chronic obstructive pulmonary disease, unspecified COPD type ([place]) -stable at this time. Without recent flare.  8. DDD of the cervical spine -completed prednisone with good results. No longer having pain. Encouraged heating pad to neck PRN. Has PT/OT ordered. May benefit from gabapentin if pain reoccurs.   Next appt: 07/08/2020 for AWV Qu[place]hawn Nin K. [place], [facility] [phone]

## 2020-07-02 DIAGNOSIS — I081 Rheumatic disorders of both mitral and tricuspid valves: Secondary | ICD-10-CM | POA: Diagnosis not present

## 2020-07-02 DIAGNOSIS — M19012 Primary osteoarthritis, left shoulder: Secondary | ICD-10-CM | POA: Diagnosis not present

## 2020-07-02 DIAGNOSIS — M4722 Other spondylosis with radiculopathy, cervical region: Secondary | ICD-10-CM | POA: Diagnosis not present

## 2020-07-02 DIAGNOSIS — I739 Peripheral vascular disease, unspecified: Secondary | ICD-10-CM | POA: Diagnosis not present

## 2020-07-02 DIAGNOSIS — I69320 Aphasia following cerebral infarction: Secondary | ICD-10-CM | POA: Diagnosis not present

## 2020-07-02 DIAGNOSIS — I700 Atherosclerosis of aorta: Secondary | ICD-10-CM | POA: Diagnosis not present

## 2020-07-02 DIAGNOSIS — I1310 Hypertensive heart and chronic kidney disease without heart failure, with stage 1 through stage 4 chronic kidney disease, or unspecified chronic kidney disease: Secondary | ICD-10-CM | POA: Diagnosis not present

## 2020-07-02 DIAGNOSIS — I69351 Hemiplegia and hemiparesis following cerebral infarction affecting right dominant side: Secondary | ICD-10-CM | POA: Diagnosis not present

## 2020-07-02 DIAGNOSIS — J449 Chronic obstructive pulmonary disease, unspecified: Secondary | ICD-10-CM | POA: Diagnosis not present

## 2020-07-02 DIAGNOSIS — G319 Degenerative disease of nervous system, unspecified: Secondary | ICD-10-CM | POA: Diagnosis not present

## 2020-07-02 DIAGNOSIS — Z792 Long term (current) use of antibiotics: Secondary | ICD-10-CM | POA: Diagnosis not present

## 2020-07-02 DIAGNOSIS — N1831 Chronic kidney disease, stage 3a: Secondary | ICD-10-CM | POA: Diagnosis not present

## 2020-07-02 DIAGNOSIS — H9190 Unspecified hearing loss, unspecified ear: Secondary | ICD-10-CM | POA: Diagnosis not present

## 2020-07-02 DIAGNOSIS — M25712 Osteophyte, left shoulder: Secondary | ICD-10-CM | POA: Diagnosis not present

## 2020-07-02 DIAGNOSIS — Z853 Personal history of malignant neoplasm of breast: Secondary | ICD-10-CM | POA: Diagnosis not present

## 2020-07-02 DIAGNOSIS — I69322 Dysarthria following cerebral infarction: Secondary | ICD-10-CM | POA: Diagnosis not present

## 2020-07-02 DIAGNOSIS — M47816 Spondylosis without myelopathy or radiculopathy, lumbar region: Secondary | ICD-10-CM | POA: Diagnosis not present

## 2020-07-02 DIAGNOSIS — M4803 Spinal stenosis, cervicothoracic region: Secondary | ICD-10-CM | POA: Diagnosis not present

## 2020-07-02 DIAGNOSIS — I480 Paroxysmal atrial fibrillation: Secondary | ICD-10-CM | POA: Diagnosis not present

## 2020-07-02 DIAGNOSIS — D72829 Elevated white blood cell count, unspecified: Secondary | ICD-10-CM | POA: Diagnosis not present

## 2020-07-02 DIAGNOSIS — M810 Age-related osteoporosis without current pathological fracture: Secondary | ICD-10-CM | POA: Diagnosis not present

## 2020-07-02 DIAGNOSIS — G9389 Other specified disorders of brain: Secondary | ICD-10-CM | POA: Diagnosis not present

## 2020-07-02 DIAGNOSIS — E785 Hyperlipidemia, unspecified: Secondary | ICD-10-CM | POA: Diagnosis not present

## 2020-07-02 DIAGNOSIS — M4802 Spinal stenosis, cervical region: Secondary | ICD-10-CM | POA: Diagnosis not present

## 2020-07-02 DIAGNOSIS — G9341 Metabolic encephalopathy: Secondary | ICD-10-CM | POA: Diagnosis not present

## 2020-07-03 ENCOUNTER — Encounter: Payer: Medicare Other | Admitting: Nurse Practitioner

## 2020-07-07 DIAGNOSIS — I69322 Dysarthria following cerebral infarction: Secondary | ICD-10-CM | POA: Diagnosis not present

## 2020-07-07 DIAGNOSIS — I69320 Aphasia following cerebral infarction: Secondary | ICD-10-CM | POA: Diagnosis not present

## 2020-07-07 DIAGNOSIS — I69351 Hemiplegia and hemiparesis following cerebral infarction affecting right dominant side: Secondary | ICD-10-CM | POA: Diagnosis not present

## 2020-07-07 DIAGNOSIS — N1831 Chronic kidney disease, stage 3a: Secondary | ICD-10-CM | POA: Diagnosis not present

## 2020-07-07 DIAGNOSIS — I1310 Hypertensive heart and chronic kidney disease without heart failure, with stage 1 through stage 4 chronic kidney disease, or unspecified chronic kidney disease: Secondary | ICD-10-CM | POA: Diagnosis not present

## 2020-07-07 DIAGNOSIS — G9341 Metabolic encephalopathy: Secondary | ICD-10-CM | POA: Diagnosis not present

## 2020-07-08 ENCOUNTER — Other Ambulatory Visit: Payer: Self-pay

## 2020-07-08 ENCOUNTER — Encounter: Payer: Medicare Other | Admitting: Nurse Practitioner

## 2020-07-08 DIAGNOSIS — G9341 Metabolic encephalopathy: Secondary | ICD-10-CM | POA: Diagnosis not present

## 2020-07-08 DIAGNOSIS — I69320 Aphasia following cerebral infarction: Secondary | ICD-10-CM | POA: Diagnosis not present

## 2020-07-08 DIAGNOSIS — I1310 Hypertensive heart and chronic kidney disease without heart failure, with stage 1 through stage 4 chronic kidney disease, or unspecified chronic kidney disease: Secondary | ICD-10-CM | POA: Diagnosis not present

## 2020-07-08 DIAGNOSIS — N1831 Chronic kidney disease, stage 3a: Secondary | ICD-10-CM | POA: Diagnosis not present

## 2020-07-08 DIAGNOSIS — I69351 Hemiplegia and hemiparesis following cerebral infarction affecting right dominant side: Secondary | ICD-10-CM | POA: Diagnosis not present

## 2020-07-08 DIAGNOSIS — I69322 Dysarthria following cerebral infarction: Secondary | ICD-10-CM | POA: Diagnosis not present

## 2020-07-09 ENCOUNTER — Other Ambulatory Visit (HOSPITAL_COMMUNITY): Payer: Self-pay

## 2020-07-09 ENCOUNTER — Encounter: Payer: Medicare Other | Admitting: Nurse Practitioner

## 2020-07-09 ENCOUNTER — Encounter: Payer: Self-pay | Admitting: Nurse Practitioner

## 2020-07-09 ENCOUNTER — Other Ambulatory Visit: Payer: Self-pay

## 2020-07-09 ENCOUNTER — Telehealth: Payer: Self-pay | Admitting: *Deleted

## 2020-07-09 NOTE — Telephone Encounter (Signed)
Daughter, [name], called and stated that patient's last Covid injection was 6 months (12/17/2019 Pfizer) ago. Daughter is wanting to take patient to get her Covid Booster but wants to know if it is safe for her mother to get it with everything patient has been through in the last month. (mini stroke, memory) Daughter stated that she didn't want to put too much on her.   Please Advise.

## 2020-07-09 NOTE — Telephone Encounter (Signed)
Dillard, Jasmine E, CMA 40 minutes ago (1:45 PM)     Patient daughter called and left a voicemail requesting Rodena Piety Louanne Calvillo/CMA. I called patient daughter back and tried to help assist. She stated to pass the message along to [facility] Jaylynne Birkhead/CMA that she wants her to call back. I advised patient that Rodena Piety Indiana Pechacek/CMA is currently on lunch and when she returns she has many patient concerns to address. So to avoid a delay in patient care I can help assist now while she's away. She states that she ONLY WANTS TO SPEAK TO Kajuan Guyton. Message routed to [place] to follow up with patient.       Documentation

## 2020-07-10 ENCOUNTER — Telehealth: Payer: Self-pay | Admitting: *Deleted

## 2020-07-10 DIAGNOSIS — I1310 Hypertensive heart and chronic kidney disease without heart failure, with stage 1 through stage 4 chronic kidney disease, or unspecified chronic kidney disease: Secondary | ICD-10-CM | POA: Diagnosis not present

## 2020-07-10 DIAGNOSIS — G9341 Metabolic encephalopathy: Secondary | ICD-10-CM | POA: Diagnosis not present

## 2020-07-10 DIAGNOSIS — I69322 Dysarthria following cerebral infarction: Secondary | ICD-10-CM | POA: Diagnosis not present

## 2020-07-10 DIAGNOSIS — N1831 Chronic kidney disease, stage 3a: Secondary | ICD-10-CM | POA: Diagnosis not present

## 2020-07-10 DIAGNOSIS — I69320 Aphasia following cerebral infarction: Secondary | ICD-10-CM | POA: Diagnosis not present

## 2020-07-10 DIAGNOSIS — I69351 Hemiplegia and hemiparesis following cerebral infarction affecting right dominant side: Secondary | ICD-10-CM | POA: Diagnosis not present

## 2020-07-10 NOTE — Telephone Encounter (Signed)
[facility] PT Nurse, called requesting verbal order for PT 1x8wks. Patient also has OT, Nursing and Education officer, museum. Also ok'ed with using Dx confusion and weakness due to previous stroke.

## 2020-07-10 NOTE — Telephone Encounter (Signed)
If she is feeling well I would recommend getting the vaccine.

## 2020-07-10 NOTE — Telephone Encounter (Signed)
LMOM with Jessica's response.

## 2020-07-12 DIAGNOSIS — I1310 Hypertensive heart and chronic kidney disease without heart failure, with stage 1 through stage 4 chronic kidney disease, or unspecified chronic kidney disease: Secondary | ICD-10-CM | POA: Diagnosis not present

## 2020-07-12 DIAGNOSIS — I69322 Dysarthria following cerebral infarction: Secondary | ICD-10-CM | POA: Diagnosis not present

## 2020-07-12 DIAGNOSIS — N1831 Chronic kidney disease, stage 3a: Secondary | ICD-10-CM | POA: Diagnosis not present

## 2020-07-12 DIAGNOSIS — G9341 Metabolic encephalopathy: Secondary | ICD-10-CM | POA: Diagnosis not present

## 2020-07-12 DIAGNOSIS — I69320 Aphasia following cerebral infarction: Secondary | ICD-10-CM | POA: Diagnosis not present

## 2020-07-12 DIAGNOSIS — I69351 Hemiplegia and hemiparesis following cerebral infarction affecting right dominant side: Secondary | ICD-10-CM | POA: Diagnosis not present

## 2020-07-14 ENCOUNTER — Telehealth: Payer: Self-pay | Admitting: *Deleted

## 2020-07-14 DIAGNOSIS — L905 Scar conditions and fibrosis of skin: Secondary | ICD-10-CM | POA: Diagnosis not present

## 2020-07-14 DIAGNOSIS — I69322 Dysarthria following cerebral infarction: Secondary | ICD-10-CM | POA: Diagnosis not present

## 2020-07-14 DIAGNOSIS — I69351 Hemiplegia and hemiparesis following cerebral infarction affecting right dominant side: Secondary | ICD-10-CM | POA: Diagnosis not present

## 2020-07-14 DIAGNOSIS — I69320 Aphasia following cerebral infarction: Secondary | ICD-10-CM | POA: Diagnosis not present

## 2020-07-14 DIAGNOSIS — I1310 Hypertensive heart and chronic kidney disease without heart failure, with stage 1 through stage 4 chronic kidney disease, or unspecified chronic kidney disease: Secondary | ICD-10-CM | POA: Diagnosis not present

## 2020-07-14 DIAGNOSIS — G9341 Metabolic encephalopathy: Secondary | ICD-10-CM | POA: Diagnosis not present

## 2020-07-14 DIAGNOSIS — Z23 Encounter for immunization: Secondary | ICD-10-CM | POA: Diagnosis not present

## 2020-07-14 DIAGNOSIS — N1831 Chronic kidney disease, stage 3a: Secondary | ICD-10-CM | POA: Diagnosis not present

## 2020-07-14 DIAGNOSIS — Z85828 Personal history of other malignant neoplasm of skin: Secondary | ICD-10-CM | POA: Diagnosis not present

## 2020-07-14 DIAGNOSIS — L814 Other melanin hyperpigmentation: Secondary | ICD-10-CM | POA: Diagnosis not present

## 2020-07-14 DIAGNOSIS — L821 Other seborrheic keratosis: Secondary | ICD-10-CM | POA: Diagnosis not present

## 2020-07-14 NOTE — Telephone Encounter (Signed)
FYI Deidra, ST with [facility], called and stated that she just wanted to make you aware that patient's blood pressure this morning was 168/70. No other complaints noted.

## 2020-07-14 NOTE — Telephone Encounter (Signed)
Was this before or after her medication was taken. Would recommend checking blood pressure 1 hour after medication and after she has been sitting at least 2 minutes

## 2020-07-14 NOTE — Telephone Encounter (Signed)
LMOM to return call.

## 2020-07-15 NOTE — Telephone Encounter (Signed)
LMOM to return call.

## 2020-07-16 DIAGNOSIS — N1831 Chronic kidney disease, stage 3a: Secondary | ICD-10-CM | POA: Diagnosis not present

## 2020-07-16 DIAGNOSIS — I1310 Hypertensive heart and chronic kidney disease without heart failure, with stage 1 through stage 4 chronic kidney disease, or unspecified chronic kidney disease: Secondary | ICD-10-CM | POA: Diagnosis not present

## 2020-07-16 DIAGNOSIS — I69351 Hemiplegia and hemiparesis following cerebral infarction affecting right dominant side: Secondary | ICD-10-CM | POA: Diagnosis not present

## 2020-07-16 DIAGNOSIS — I69322 Dysarthria following cerebral infarction: Secondary | ICD-10-CM | POA: Diagnosis not present

## 2020-07-16 DIAGNOSIS — I69320 Aphasia following cerebral infarction: Secondary | ICD-10-CM | POA: Diagnosis not present

## 2020-07-16 DIAGNOSIS — G9341 Metabolic encephalopathy: Secondary | ICD-10-CM | POA: Diagnosis not present

## 2020-07-23 ENCOUNTER — Telehealth: Payer: Self-pay | Admitting: *Deleted

## 2020-07-23 DIAGNOSIS — I69320 Aphasia following cerebral infarction: Secondary | ICD-10-CM | POA: Diagnosis not present

## 2020-07-23 DIAGNOSIS — G9341 Metabolic encephalopathy: Secondary | ICD-10-CM | POA: Diagnosis not present

## 2020-07-23 DIAGNOSIS — I69351 Hemiplegia and hemiparesis following cerebral infarction affecting right dominant side: Secondary | ICD-10-CM | POA: Diagnosis not present

## 2020-07-23 DIAGNOSIS — N1831 Chronic kidney disease, stage 3a: Secondary | ICD-10-CM | POA: Diagnosis not present

## 2020-07-23 DIAGNOSIS — I69322 Dysarthria following cerebral infarction: Secondary | ICD-10-CM | POA: Diagnosis not present

## 2020-07-23 DIAGNOSIS — I1310 Hypertensive heart and chronic kidney disease without heart failure, with stage 1 through stage 4 chronic kidney disease, or unspecified chronic kidney disease: Secondary | ICD-10-CM | POA: Diagnosis not present

## 2020-07-23 NOTE — Telephone Encounter (Signed)
Please continue to take blood pressure twice a day, two hours after taking blood pressure medications. If SBP> 170 several times, please schedule visit with office.  Please make sure blood pressure is taken with appropriate cuff for patients arm and not over clothing. Continue to limit salt in diet.

## 2020-07-23 NOTE — Telephone Encounter (Signed)
Dionka with [facility] called and stated that she saw patient this morning and her blood pressure is 172/74 with no other symptoms except Lightheaded.  Patient took blood pressure medication 2 hours prior to the blood pressure reading.   Please Advise. (Forwarded to Amy due to Janett Billow out of office)

## 2020-07-23 NOTE — Telephone Encounter (Signed)
[name] with [facility] notified and agreed. She will inform family.

## 2020-07-24 ENCOUNTER — Other Ambulatory Visit (HOSPITAL_COMMUNITY): Payer: Self-pay

## 2020-07-24 ENCOUNTER — Other Ambulatory Visit: Payer: Self-pay | Admitting: Nurse Practitioner

## 2020-07-24 ENCOUNTER — Telehealth: Payer: Self-pay | Admitting: Nurse Practitioner

## 2020-07-24 DIAGNOSIS — I69351 Hemiplegia and hemiparesis following cerebral infarction affecting right dominant side: Secondary | ICD-10-CM | POA: Diagnosis not present

## 2020-07-24 DIAGNOSIS — N1831 Chronic kidney disease, stage 3a: Secondary | ICD-10-CM | POA: Diagnosis not present

## 2020-07-24 DIAGNOSIS — I1310 Hypertensive heart and chronic kidney disease without heart failure, with stage 1 through stage 4 chronic kidney disease, or unspecified chronic kidney disease: Secondary | ICD-10-CM | POA: Diagnosis not present

## 2020-07-24 DIAGNOSIS — I69320 Aphasia following cerebral infarction: Secondary | ICD-10-CM | POA: Diagnosis not present

## 2020-07-24 DIAGNOSIS — G9341 Metabolic encephalopathy: Secondary | ICD-10-CM | POA: Diagnosis not present

## 2020-07-24 DIAGNOSIS — I69322 Dysarthria following cerebral infarction: Secondary | ICD-10-CM | POA: Diagnosis not present

## 2020-07-24 MED ORDER — ATORVASTATIN CALCIUM 40 MG PO TABS
40.0000 mg | ORAL_TABLET | Freq: Every day | ORAL | 1 refills | Status: DC
  Filled 2020-07-24: qty 90, 90d supply, fill #0
  Filled 2020-10-28: qty 90, 90d supply, fill #1

## 2020-07-24 MED FILL — Mirabegron Tab ER 24 HR 50 MG: ORAL | 30 days supply | Qty: 30 | Fill #1 | Status: AC

## 2020-07-24 MED FILL — Apixaban Tab 5 MG: ORAL | 30 days supply | Qty: 60 | Fill #1 | Status: AC

## 2020-07-24 NOTE — Telephone Encounter (Signed)
Robin with [facility] called to document that Ms Fabiana missed her OT appt today 07/24/20 bc she had a drs appt & didn't make it back in time.  Will resume OT next week Thanks, Vilinda Blanks

## 2020-07-29 DIAGNOSIS — I69320 Aphasia following cerebral infarction: Secondary | ICD-10-CM | POA: Diagnosis not present

## 2020-07-29 DIAGNOSIS — G9341 Metabolic encephalopathy: Secondary | ICD-10-CM | POA: Diagnosis not present

## 2020-07-29 DIAGNOSIS — I1310 Hypertensive heart and chronic kidney disease without heart failure, with stage 1 through stage 4 chronic kidney disease, or unspecified chronic kidney disease: Secondary | ICD-10-CM | POA: Diagnosis not present

## 2020-07-29 DIAGNOSIS — I69322 Dysarthria following cerebral infarction: Secondary | ICD-10-CM | POA: Diagnosis not present

## 2020-07-29 DIAGNOSIS — I69351 Hemiplegia and hemiparesis following cerebral infarction affecting right dominant side: Secondary | ICD-10-CM | POA: Diagnosis not present

## 2020-07-29 DIAGNOSIS — N1831 Chronic kidney disease, stage 3a: Secondary | ICD-10-CM | POA: Diagnosis not present

## 2020-07-30 DIAGNOSIS — N1831 Chronic kidney disease, stage 3a: Secondary | ICD-10-CM | POA: Diagnosis not present

## 2020-07-30 DIAGNOSIS — G9341 Metabolic encephalopathy: Secondary | ICD-10-CM | POA: Diagnosis not present

## 2020-07-30 DIAGNOSIS — I1310 Hypertensive heart and chronic kidney disease without heart failure, with stage 1 through stage 4 chronic kidney disease, or unspecified chronic kidney disease: Secondary | ICD-10-CM | POA: Diagnosis not present

## 2020-07-30 DIAGNOSIS — I69322 Dysarthria following cerebral infarction: Secondary | ICD-10-CM | POA: Diagnosis not present

## 2020-07-30 DIAGNOSIS — I69320 Aphasia following cerebral infarction: Secondary | ICD-10-CM | POA: Diagnosis not present

## 2020-07-30 DIAGNOSIS — I69351 Hemiplegia and hemiparesis following cerebral infarction affecting right dominant side: Secondary | ICD-10-CM | POA: Diagnosis not present

## 2020-07-31 DIAGNOSIS — I69320 Aphasia following cerebral infarction: Secondary | ICD-10-CM | POA: Diagnosis not present

## 2020-07-31 DIAGNOSIS — I1310 Hypertensive heart and chronic kidney disease without heart failure, with stage 1 through stage 4 chronic kidney disease, or unspecified chronic kidney disease: Secondary | ICD-10-CM | POA: Diagnosis not present

## 2020-07-31 DIAGNOSIS — G9341 Metabolic encephalopathy: Secondary | ICD-10-CM | POA: Diagnosis not present

## 2020-07-31 DIAGNOSIS — I69351 Hemiplegia and hemiparesis following cerebral infarction affecting right dominant side: Secondary | ICD-10-CM | POA: Diagnosis not present

## 2020-07-31 DIAGNOSIS — I69322 Dysarthria following cerebral infarction: Secondary | ICD-10-CM | POA: Diagnosis not present

## 2020-07-31 DIAGNOSIS — N1831 Chronic kidney disease, stage 3a: Secondary | ICD-10-CM | POA: Diagnosis not present

## 2020-08-01 DIAGNOSIS — I081 Rheumatic disorders of both mitral and tricuspid valves: Secondary | ICD-10-CM | POA: Diagnosis not present

## 2020-08-01 DIAGNOSIS — G9341 Metabolic encephalopathy: Secondary | ICD-10-CM | POA: Diagnosis not present

## 2020-08-01 DIAGNOSIS — M4803 Spinal stenosis, cervicothoracic region: Secondary | ICD-10-CM | POA: Diagnosis not present

## 2020-08-01 DIAGNOSIS — I69351 Hemiplegia and hemiparesis following cerebral infarction affecting right dominant side: Secondary | ICD-10-CM | POA: Diagnosis not present

## 2020-08-01 DIAGNOSIS — M810 Age-related osteoporosis without current pathological fracture: Secondary | ICD-10-CM | POA: Diagnosis not present

## 2020-08-01 DIAGNOSIS — Z792 Long term (current) use of antibiotics: Secondary | ICD-10-CM | POA: Diagnosis not present

## 2020-08-01 DIAGNOSIS — H9190 Unspecified hearing loss, unspecified ear: Secondary | ICD-10-CM | POA: Diagnosis not present

## 2020-08-01 DIAGNOSIS — E785 Hyperlipidemia, unspecified: Secondary | ICD-10-CM | POA: Diagnosis not present

## 2020-08-01 DIAGNOSIS — I480 Paroxysmal atrial fibrillation: Secondary | ICD-10-CM | POA: Diagnosis not present

## 2020-08-01 DIAGNOSIS — I739 Peripheral vascular disease, unspecified: Secondary | ICD-10-CM | POA: Diagnosis not present

## 2020-08-01 DIAGNOSIS — J449 Chronic obstructive pulmonary disease, unspecified: Secondary | ICD-10-CM | POA: Diagnosis not present

## 2020-08-01 DIAGNOSIS — M19012 Primary osteoarthritis, left shoulder: Secondary | ICD-10-CM | POA: Diagnosis not present

## 2020-08-01 DIAGNOSIS — N1831 Chronic kidney disease, stage 3a: Secondary | ICD-10-CM | POA: Diagnosis not present

## 2020-08-01 DIAGNOSIS — G319 Degenerative disease of nervous system, unspecified: Secondary | ICD-10-CM | POA: Diagnosis not present

## 2020-08-01 DIAGNOSIS — D72829 Elevated white blood cell count, unspecified: Secondary | ICD-10-CM | POA: Diagnosis not present

## 2020-08-01 DIAGNOSIS — I1310 Hypertensive heart and chronic kidney disease without heart failure, with stage 1 through stage 4 chronic kidney disease, or unspecified chronic kidney disease: Secondary | ICD-10-CM | POA: Diagnosis not present

## 2020-08-01 DIAGNOSIS — I69322 Dysarthria following cerebral infarction: Secondary | ICD-10-CM | POA: Diagnosis not present

## 2020-08-01 DIAGNOSIS — G9389 Other specified disorders of brain: Secondary | ICD-10-CM | POA: Diagnosis not present

## 2020-08-01 DIAGNOSIS — M47816 Spondylosis without myelopathy or radiculopathy, lumbar region: Secondary | ICD-10-CM | POA: Diagnosis not present

## 2020-08-01 DIAGNOSIS — M4802 Spinal stenosis, cervical region: Secondary | ICD-10-CM | POA: Diagnosis not present

## 2020-08-01 DIAGNOSIS — M25712 Osteophyte, left shoulder: Secondary | ICD-10-CM | POA: Diagnosis not present

## 2020-08-01 DIAGNOSIS — Z853 Personal history of malignant neoplasm of breast: Secondary | ICD-10-CM | POA: Diagnosis not present

## 2020-08-01 DIAGNOSIS — I69320 Aphasia following cerebral infarction: Secondary | ICD-10-CM | POA: Diagnosis not present

## 2020-08-01 DIAGNOSIS — M4722 Other spondylosis with radiculopathy, cervical region: Secondary | ICD-10-CM | POA: Diagnosis not present

## 2020-08-01 DIAGNOSIS — I700 Atherosclerosis of aorta: Secondary | ICD-10-CM | POA: Diagnosis not present

## 2020-08-04 DIAGNOSIS — N1831 Chronic kidney disease, stage 3a: Secondary | ICD-10-CM | POA: Diagnosis not present

## 2020-08-04 DIAGNOSIS — I1310 Hypertensive heart and chronic kidney disease without heart failure, with stage 1 through stage 4 chronic kidney disease, or unspecified chronic kidney disease: Secondary | ICD-10-CM | POA: Diagnosis not present

## 2020-08-04 DIAGNOSIS — I69322 Dysarthria following cerebral infarction: Secondary | ICD-10-CM | POA: Diagnosis not present

## 2020-08-04 DIAGNOSIS — G9341 Metabolic encephalopathy: Secondary | ICD-10-CM | POA: Diagnosis not present

## 2020-08-04 DIAGNOSIS — I69320 Aphasia following cerebral infarction: Secondary | ICD-10-CM | POA: Diagnosis not present

## 2020-08-04 DIAGNOSIS — I69351 Hemiplegia and hemiparesis following cerebral infarction affecting right dominant side: Secondary | ICD-10-CM | POA: Diagnosis not present

## 2020-08-07 DIAGNOSIS — I69322 Dysarthria following cerebral infarction: Secondary | ICD-10-CM | POA: Diagnosis not present

## 2020-08-07 DIAGNOSIS — I1310 Hypertensive heart and chronic kidney disease without heart failure, with stage 1 through stage 4 chronic kidney disease, or unspecified chronic kidney disease: Secondary | ICD-10-CM | POA: Diagnosis not present

## 2020-08-07 DIAGNOSIS — I69320 Aphasia following cerebral infarction: Secondary | ICD-10-CM | POA: Diagnosis not present

## 2020-08-07 DIAGNOSIS — N1831 Chronic kidney disease, stage 3a: Secondary | ICD-10-CM | POA: Diagnosis not present

## 2020-08-07 DIAGNOSIS — I69351 Hemiplegia and hemiparesis following cerebral infarction affecting right dominant side: Secondary | ICD-10-CM | POA: Diagnosis not present

## 2020-08-07 DIAGNOSIS — G9341 Metabolic encephalopathy: Secondary | ICD-10-CM | POA: Diagnosis not present

## 2020-08-14 DIAGNOSIS — I1310 Hypertensive heart and chronic kidney disease without heart failure, with stage 1 through stage 4 chronic kidney disease, or unspecified chronic kidney disease: Secondary | ICD-10-CM | POA: Diagnosis not present

## 2020-08-14 DIAGNOSIS — I69322 Dysarthria following cerebral infarction: Secondary | ICD-10-CM | POA: Diagnosis not present

## 2020-08-14 DIAGNOSIS — I69351 Hemiplegia and hemiparesis following cerebral infarction affecting right dominant side: Secondary | ICD-10-CM | POA: Diagnosis not present

## 2020-08-14 DIAGNOSIS — I69320 Aphasia following cerebral infarction: Secondary | ICD-10-CM | POA: Diagnosis not present

## 2020-08-14 DIAGNOSIS — N1831 Chronic kidney disease, stage 3a: Secondary | ICD-10-CM | POA: Diagnosis not present

## 2020-08-14 DIAGNOSIS — G9341 Metabolic encephalopathy: Secondary | ICD-10-CM | POA: Diagnosis not present

## 2020-08-21 DIAGNOSIS — I69320 Aphasia following cerebral infarction: Secondary | ICD-10-CM | POA: Diagnosis not present

## 2020-08-21 DIAGNOSIS — I69351 Hemiplegia and hemiparesis following cerebral infarction affecting right dominant side: Secondary | ICD-10-CM | POA: Diagnosis not present

## 2020-08-21 DIAGNOSIS — I1310 Hypertensive heart and chronic kidney disease without heart failure, with stage 1 through stage 4 chronic kidney disease, or unspecified chronic kidney disease: Secondary | ICD-10-CM | POA: Diagnosis not present

## 2020-08-21 DIAGNOSIS — I69322 Dysarthria following cerebral infarction: Secondary | ICD-10-CM | POA: Diagnosis not present

## 2020-08-21 DIAGNOSIS — N1831 Chronic kidney disease, stage 3a: Secondary | ICD-10-CM | POA: Diagnosis not present

## 2020-08-21 DIAGNOSIS — G9341 Metabolic encephalopathy: Secondary | ICD-10-CM | POA: Diagnosis not present

## 2020-08-28 DIAGNOSIS — I69351 Hemiplegia and hemiparesis following cerebral infarction affecting right dominant side: Secondary | ICD-10-CM | POA: Diagnosis not present

## 2020-08-28 DIAGNOSIS — G9341 Metabolic encephalopathy: Secondary | ICD-10-CM | POA: Diagnosis not present

## 2020-08-28 DIAGNOSIS — I69320 Aphasia following cerebral infarction: Secondary | ICD-10-CM | POA: Diagnosis not present

## 2020-08-28 DIAGNOSIS — N1831 Chronic kidney disease, stage 3a: Secondary | ICD-10-CM | POA: Diagnosis not present

## 2020-08-28 DIAGNOSIS — I1310 Hypertensive heart and chronic kidney disease without heart failure, with stage 1 through stage 4 chronic kidney disease, or unspecified chronic kidney disease: Secondary | ICD-10-CM | POA: Diagnosis not present

## 2020-08-28 DIAGNOSIS — I69322 Dysarthria following cerebral infarction: Secondary | ICD-10-CM | POA: Diagnosis not present

## 2020-08-29 ENCOUNTER — Telehealth: Payer: Self-pay

## 2020-08-29 ENCOUNTER — Other Ambulatory Visit (HOSPITAL_COMMUNITY): Payer: Self-pay

## 2020-08-29 MED FILL — Mirabegron Tab ER 24 HR 50 MG: ORAL | 30 days supply | Qty: 30 | Fill #2 | Status: AC

## 2020-08-29 NOTE — Telephone Encounter (Signed)
Would recommended seeing if another person can help with her care, sometimes patients will resist certain family members. If there is anything else going on may need evaluation and would recommend appt.

## 2020-08-29 NOTE — Telephone Encounter (Signed)
Son states that he is unable to get patient to take her medications. This has been going on for about a week. He states she has been acting mean towards him and not listening. She has walked off a couple of times and he had to call sheriff to get her. Patient would like to know what should he do. Please advise.  Message routed to Sherrie Mustache, NP

## 2020-08-29 NOTE — Telephone Encounter (Signed)
Spoke with son he will call Tuesday to make an appointment

## 2020-09-02 ENCOUNTER — Other Ambulatory Visit (HOSPITAL_COMMUNITY): Payer: Self-pay

## 2020-09-02 MED FILL — Apixaban Tab 5 MG: ORAL | 30 days supply | Qty: 60 | Fill #2 | Status: AC

## 2020-09-10 ENCOUNTER — Other Ambulatory Visit (HOSPITAL_COMMUNITY): Payer: Self-pay

## 2020-09-10 MED FILL — Metoprolol Tartrate Tab 25 MG: ORAL | 90 days supply | Qty: 180 | Fill #0 | Status: AC

## 2020-10-01 ENCOUNTER — Other Ambulatory Visit: Payer: Self-pay | Admitting: Urology

## 2020-10-01 ENCOUNTER — Other Ambulatory Visit: Payer: Self-pay

## 2020-10-01 ENCOUNTER — Ambulatory Visit (INDEPENDENT_AMBULATORY_CARE_PROVIDER_SITE_OTHER): Payer: Medicare Other | Admitting: Nurse Practitioner

## 2020-10-01 ENCOUNTER — Other Ambulatory Visit (HOSPITAL_COMMUNITY): Payer: Self-pay

## 2020-10-01 ENCOUNTER — Encounter: Payer: Self-pay | Admitting: Nurse Practitioner

## 2020-10-01 VITALS — BP 132/80 | HR 57 | Temp 97.1°F | Ht 64.0 in | Wt 156.8 lb

## 2020-10-01 DIAGNOSIS — R058 Other specified cough: Secondary | ICD-10-CM

## 2020-10-01 DIAGNOSIS — Z8673 Personal history of transient ischemic attack (TIA), and cerebral infarction without residual deficits: Secondary | ICD-10-CM

## 2020-10-01 DIAGNOSIS — I10 Essential (primary) hypertension: Secondary | ICD-10-CM

## 2020-10-01 DIAGNOSIS — K5901 Slow transit constipation: Secondary | ICD-10-CM | POA: Diagnosis not present

## 2020-10-01 DIAGNOSIS — I639 Cerebral infarction, unspecified: Secondary | ICD-10-CM

## 2020-10-01 DIAGNOSIS — G319 Degenerative disease of nervous system, unspecified: Secondary | ICD-10-CM | POA: Diagnosis not present

## 2020-10-01 DIAGNOSIS — I739 Peripheral vascular disease, unspecified: Secondary | ICD-10-CM | POA: Diagnosis not present

## 2020-10-01 NOTE — Progress Notes (Signed)
Careteam: Patient Care Team: Lauree Chandler, NP as PCP - General (Geriatric Medicine) Druscilla Brownie, MD as Consulting Physician (Dermatology) [name] Irani, MD (Inactive) as Consulting Physician (Gastroenterology) Neldon Mc, MD as Consulting Physician (General Surgery) Lorretta Harp, MD as Consulting Physician (Cardiology) Bjorn Loser, MD as Consulting Physician (Urology) Lavell Anchors, MD as Consulting Physician (Ophthalmology) Pieter Partridge, DO as Consulting Physician (Neurology)  PLACE OF SERVICE:  [place]  Advanced Directive information    Allergies  Allergen Reactions   Lisinopril Cough   Ceftriaxone Rash   Codeine Rash    All over the body.    Chief Complaint  Patient presents with   Medical Management of Chronic Issues    6 month follow-up. Per patients son, patient is sleeps about 12-15 hours a day. Here with son, [name]. [name] states patient doesn't take medication daily      HPI: Patient is a 87 y.o. female for routine follow up.  Here with son. He stays with her to help her.   Constipation- using milk of mag 1 tablespoon every night. Occasional diarrhea. She has been doing this long term.   No trouble swallowing. Eating 2 good meals a good.   Htn- controlled on current regimen.   Son reports he has a hard time getting her medication. Reports there was several days that she would not take her medications. Son stated she had increase in agitation.   Hx CVA- overall stable.no physical deficits, decrease energy. memory loss. Gets confused, but overall improved from prior to stroke.   Review of Systems:  Review of Systems  Constitutional:  Negative for chills, fever and weight loss.  HENT:  Negative for tinnitus.   Respiratory:  Negative for cough, sputum production and shortness of breath.   Cardiovascular:  Negative for chest pain, palpitations and leg swelling.  Gastrointestinal:  Negative for abdominal pain, constipation,  diarrhea and heartburn.  Genitourinary:  Negative for dysuria, frequency and urgency.  Musculoskeletal:  Negative for back pain, falls, joint pain and myalgias.  Skin: Negative.   Neurological:  Negative for dizziness and headaches.  Psychiatric/Behavioral:  Positive for memory loss. Negative for depression. The patient is not nervous/anxious and does not have insomnia.    Past Medical History:  Diagnosis Date   Aortic stenosis 07/20/2012   Aortic stenosis    Aortic valve disorder 08/13/2011   ECHO - EF 123456; mild diastolic dysfunction; calcified aortic valve, not well visualized; mod/severe aortic stenosis w/ worsening gradients when compared to 2012   Arthritis    Cancer ([place])    Breast   Carotid bruit 08/06/2008   Doppler - R ECA demonstrates noarrowing w/ elevated velocities consistent w/ >70% diameter reduction; R and L ICAs show no evidence of diameter reduction, significant tortuosity or vascular abnormality;    Change in voice    COPD (chronic obstructive pulmonary disease) (HCC)    Hearing loss    Heart murmur    Hyperlipidemia    Hypertension    dr Gwenlyn Found   Osteoporosis    PAF (paroxysmal atrial fibrillation) ([place]) 09/04/2012   PVD (peripheral vascular disease) ([place]) 08/13/2010   R/P MV - normal pattern of perfusion in all regions, EF 76%; no significant wall abnormalities noted; normal perfusion study   Right carotid bruit    high-grade right external carotid artery stenosis by 2 parts ultrasound 2 years ago   S/P aortic valve replacement with bioprosthetic valve 08/30/2012   21 mm [facility] Ease bovine pericardial  tissue valve   Stroke (cerebrum) [facility])    Past Surgical History:  Procedure Laterality Date   ABDOMINAL HYSTERECTOMY     Partial   AORTIC VALVE REPLACEMENT N/A 08/30/2012   Procedure: AORTIC VALVE REPLACEMENT (AVR);  Surgeon: Rexene Alberts, MD;  Location: [place];  Service: Open Heart Surgery;  Laterality: N/A;   BIOPSY SHOULDER Left 10/08/2005   shave biopsy    BREAST LUMPECTOMY  02/25/1997   right   [place] Left 1997   Breast implant   EYE SURGERY  1997   Cataract surgery   INTRAOPERATIVE TRANSESOPHAGEAL ECHOCARDIOGRAM N/A 08/30/2012   Procedure: INTRAOPERATIVE TRANSESOPHAGEAL ECHOCARDIOGRAM;  Surgeon: Rexene Alberts, MD;  Location: [place];  Service: Open Heart Surgery;  Laterality: N/A;   LEFT AND RIGHT HEART CATHETERIZATION WITH CORONARY ANGIOGRAM N/A 08/14/2012   Procedure: LEFT AND RIGHT HEART CATHETERIZATION WITH CORONARY ANGIOGRAM;  Surgeon: Lorretta Harp, MD;  Location: [facility] C[facility] LAB;  Service: Cardiovascular;  Laterality: N/A;   LEFT HEART CATH  08/14/12   Nl cors, AS   MASTECTOMY  09/29/95   left   PARTIAL HYSTERECTOMY     TOTAL HIP ARTHROPLASTY  09/2010   Social History:   reports that she has never smoked. She has never used smokeless tobacco. She reports that she does not drink alcohol and does not use drugs.  Family History  Problem Relation Age of Onset   Cancer Mother        Bladder   Early death Father        Tractor accident   Early death Brother        Radiation protection practitioner   Early death Brother        during heart surgery    Medications: Patient's Medications  New Prescriptions   No medications on file  Previous Medications   ACETAMINOPHEN (TYLENOL) 325 MG TABLET    Take 650 mg by mouth at bedtime as needed (pain).   ACETAMINOPHEN (TYLENOL) 650 MG CR TABLET    Take 650 mg by mouth every morning.   ALBUTEROL (PROAIR HFA) 108 (90 BASE) MCG/ACT INHALER    INHALE 1 PUFF INTO THE LUNGS EVERY 6 (SIX) HOURS AS NEEDED FOR WHEEZING OR SHORTNESS OF BREATH.   AMOXICILLIN (AMOXIL) 500 MG TABLET    Take 4 by mouth 1 hour prior to dental procedure   APIXABAN (ELIQUIS) 5 MG TABS TABLET    TAKE 1 TABLET BY MOUTH TWICE DAILY.   ASCORBIC ACID (VITAMIN C) 100 MG TABLET    Take 100 mg by mouth daily.   ATORVASTATIN (LIPITOR) 40 MG TABLET    Take 1 tablet (40 mg total) by mouth daily.    CALCIUM CITRATE-VITAMIN D (CITRACAL + D PO)    Take 1 tablet by mouth 2 (two) times daily.    CHLORPHENIRAMINE MALEATE (EQ CHLORTABS PO)    Take 1 tablet by mouth every 4 (four) hours as needed.   DEXTROMETHORPHAN-GUAIFENESIN (MUCINEX DM) 30-600 MG 12HR TABLET    Take 1 tablet by mouth 2 (two) times daily as needed.    FAMOTIDINE (PEPCID) 20 MG TABLET    One after supper   FLUTICASONE (FLONASE) 50 MCG/ACT NASAL SPRAY    PLACE 1 SPRAY INTO BOTH NOSTRILS TWICE DAILY AS NEEDED FOR ALLERGIES OR RHINITIS.   HYDROXYZINE (ATARAX/VISTARIL) 25 MG TABLET    Take one tablet by mouth daily as needed for itching.   MAGNESIUM  HYDROXIDE (PHILLIPS MILK OF MAGNESIA PO)    Take 15 mL by mouth. Take daily at 6 o'clock at night   METOPROLOL TARTRATE (LOPRESSOR) 25 MG TABLET    TAKE 1 TABLET (25 MG TOTAL) BY MOUTH 2 (TWO) TIMES DAILY.   MIRABEGRON ER (MYRBETRIQ) 50 MG TB24 TABLET    TAKE 1 TABLET BY MOUTH ONCE DAILY.   OMEGA-3 FATTY ACIDS (FISH OIL PO)    Take 1 capsule by mouth daily.   OMEPRAZOLE (PRILOSEC) 20 MG CAPSULE    Take 1 capsule (20 mg total) by mouth daily.   SHARK LIVER OIL-COCOA BUTTER (PREPARATION H) 0.25-3-85.5 % SUPPOSITORY    Place 1 suppository rectally as needed for hemorrhoids.   SPECIALTY VITAMINS PRODUCTS (ADVANCED COLLAGEN PO)    Take 1,000 mg by mouth daily.   VALSARTAN (DIOVAN) 160 MG TABLET    Take 1 tablet (160 mg total) by mouth in the morning.   VALSARTAN (DIOVAN) 80 MG TABLET    Take 1 tablet (80 mg total) by mouth every evening.   VITAMIN E 400 UNIT CAPSULE    Take 400 Units by mouth daily.  Modified Medications   No medications on file  Discontinued Medications   No medications on file    Physical Exam:  Vitals:   10/01/20 1520  BP: 132/80  Pulse: (!) 57  Temp: (!) 97.1 F (36.2 C)  TempSrc: Temporal  SpO2: 96%  Weight: 156 lb 12.8 oz (71.1 kg)  Height: '5\' 4"'$  (1.626 m)   Body mass index is 26.91 kg/m. Wt Readings from Last 3 Encounters:  10/01/20 156 lb 12.8 oz  (71.1 kg)  07/01/20 148 lb 12.8 oz (67.5 kg)  06/23/20 160 lb (72.6 kg)    Physical Exam Constitutional:      General: She is not in acute distress.    Appearance: She is well-developed. She is not diaphoretic.  HENT:     Head: Normocephalic and atraumatic.     Mouth/Throat:     Pharynx: No oropharyngeal exudate.  Eyes:     Conjunctiva/sclera: Conjunctivae normal.     Pupils: Pupils are equal, round, and reactive to light.  Cardiovascular:     Rate and Rhythm: Normal rate and regular rhythm.     Heart sounds: Normal heart sounds.  Pulmonary:     Effort: Pulmonary effort is normal.     Breath sounds: Normal breath sounds.  Abdominal:     General: Bowel sounds are normal.     Palpations: Abdomen is soft.  Musculoskeletal:     Cervical back: Normal range of motion and neck supple.     Right lower leg: No edema.     Left lower leg: No edema.  Skin:    General: Skin is warm and dry.  Neurological:     Mental Status: She is alert. Mental status is at baseline.     Motor: No weakness.     Gait: Gait normal.  Psychiatric:        Mood and Affect: Mood normal.    Labs reviewed: Basic Metabolic Panel: Recent Labs    06/23/20 1933 06/23/20 1945 06/24/20 0940 06/25/20 0203 06/26/20 0304  NA 136 139  --  138 135  K 3.8 3.8  --  3.8 3.9  CL 100 100  --  103 101  CO2 27  --   --  25 22  GLUCOSE 162* 160*  --  96 142*  BUN 17 19  --  13 22  CREATININE  1.14* 1.10*  --  0.95 1.08*  CALCIUM 9.6  --   --  9.5 9.2  MG  --   --   --  1.8  --   TSH  --   --  2.220  --   --    Liver Function Tests: Recent Labs    06/23/20 1933  AST 22  ALT 19  ALKPHOS 55  BILITOT 0.8  PROT 6.3*  ALBUMIN 3.8   No results for input(s): LIPASE, AMYLASE in the last 8760 hours. No results for input(s): AMMONIA in the last 8760 hours. CBC: Recent Labs    11/12/19 1557 04/02/20 1634 06/23/20 1933 06/23/20 1945 06/26/20 0304  WBC 7.5 7.7 6.5  --  14.0*  NEUTROABS 4,680 4,458 4.2  --    --   HGB 12.6 12.5 13.3 13.6 13.1  HCT 37.0 36.6 40.2 40.0 38.6  MCV 98.7 98.1 100.0  --  97.2  PLT 168 164 160  --  161   Lipid Panel: Recent Labs    06/24/20 1932  CHOL 169  HDL 66  LDLCALC 92  TRIG 54  CHOLHDL 2.6   TSH: Recent Labs    06/24/20 0940  TSH 2.220   A1C: Lab Results  Component Value Date   HGBA1C 5.3 06/24/2020     Assessment/Plan 1. History of CVA (cerebrovascular accident) -stable, without worsening deficit. Continue to make sure cholesterol and bp remain controlled.  - CBC with Differential/Platelet - COMPLETE METABOLIC PANEL WITH GFR  2. Essential hypertension -stable. Goal bp <140/90. Continue on valsartan and metoprolol,  with low sodium diet.  - CBC with Differential/Platelet - COMPLETE METABOLIC PANEL WITH GFR  3. Cerebral atrophy (HCC) Cognitive decline after stroke. Family helping her with medication management and staying with her at her home.   4. Peripheral vascular disease, unspecified ([place]) Stable, without reports of LE pain, continues on eliquis.   5. Slow transit constipation Stable on current regimen.   6. Upper airway cough syndrome Ongoing. Not using mucinex DM at this time. Encouraged to restart at this time.  Next appt: 4 months, sooner if needed  Yama Nielson K. [place], [facility] [phone]  .

## 2020-10-01 NOTE — Patient Instructions (Signed)
To use mucinex DM by mouth twice daily as needed cough

## 2020-10-02 ENCOUNTER — Other Ambulatory Visit: Payer: Self-pay | Admitting: Urology

## 2020-10-02 ENCOUNTER — Other Ambulatory Visit (HOSPITAL_COMMUNITY): Payer: Self-pay

## 2020-10-02 LAB — COMPLETE METABOLIC PANEL WITH GFR
AG Ratio: 2 (calc) (ref 1.0–2.5)
ALT: 36 U/L — ABNORMAL HIGH (ref 6–29)
AST: 33 U/L (ref 10–35)
Albumin: 4.1 g/dL (ref 3.6–5.1)
Alkaline phosphatase (APISO): 75 U/L (ref 37–153)
BUN/Creatinine Ratio: 17 (calc) (ref 6–22)
BUN: 19 mg/dL (ref 7–25)
CO2: 25 mmol/L (ref 20–32)
Calcium: 9.7 mg/dL (ref 8.6–10.4)
Chloride: 106 mmol/L (ref 98–110)
Creat: 1.12 mg/dL — ABNORMAL HIGH (ref 0.60–0.95)
Globulin: 2.1 g/dL (calc) (ref 1.9–3.7)
Glucose, Bld: 97 mg/dL (ref 65–139)
Potassium: 4.3 mmol/L (ref 3.5–5.3)
Sodium: 140 mmol/L (ref 135–146)
Total Bilirubin: 0.9 mg/dL (ref 0.2–1.2)
Total Protein: 6.2 g/dL (ref 6.1–8.1)
eGFR: 48 mL/min/{1.73_m2} — ABNORMAL LOW (ref >=60)

## 2020-10-02 LAB — CBC WITH DIFFERENTIAL/PLATELET
Absolute Monocytes: 727 cells/uL (ref 200–950)
Basophils Absolute: 47 cells/uL (ref 0–200)
Basophils Relative: 0.6 %
Eosinophils Absolute: 205 cells/uL (ref 15–500)
Eosinophils Relative: 2.6 %
HCT: 37.6 % (ref 35.0–45.0)
Hemoglobin: 12.3 g/dL (ref 11.7–15.5)
Lymphs Abs: 1983 cells/uL (ref 850–3900)
MCH: 32.5 pg (ref 27.0–33.0)
MCHC: 32.7 g/dL (ref 32.0–36.0)
MCV: 99.2 fL (ref 80.0–100.0)
MPV: 10.6 fL (ref 7.5–12.5)
Monocytes Relative: 9.2 %
Neutro Abs: 4938 cells/uL (ref 1500–7800)
Neutrophils Relative %: 62.5 %
Platelets: 152 10*3/uL (ref 140–400)
RBC: 3.79 10*6/uL — ABNORMAL LOW (ref 3.80–5.10)
RDW: 12.7 % (ref 11.0–15.0)
Total Lymphocyte: 25.1 %
WBC: 7.9 10*3/uL (ref 3.8–10.8)

## 2020-10-03 ENCOUNTER — Other Ambulatory Visit: Payer: Self-pay

## 2020-10-03 ENCOUNTER — Other Ambulatory Visit (HOSPITAL_COMMUNITY): Payer: Self-pay

## 2020-10-03 MED FILL — Mirabegron Tab ER 24 HR 50 MG: ORAL | 30 days supply | Qty: 30 | Fill #3 | Status: AC

## 2020-10-06 ENCOUNTER — Other Ambulatory Visit: Payer: Self-pay | Admitting: Urology

## 2020-10-06 ENCOUNTER — Other Ambulatory Visit (HOSPITAL_COMMUNITY): Payer: Self-pay

## 2020-10-07 ENCOUNTER — Other Ambulatory Visit (HOSPITAL_COMMUNITY): Payer: Self-pay

## 2020-10-07 MED ORDER — MYRBETRIQ 50 MG PO TB24
50.0000 mg | ORAL_TABLET | Freq: Every day | ORAL | 0 refills | Status: DC
  Filled 2020-10-07 – 2020-10-28 (×2): qty 30, 30d supply, fill #0
  Filled 2020-12-05: qty 30, 30d supply, fill #1

## 2020-10-13 ENCOUNTER — Other Ambulatory Visit (HOSPITAL_COMMUNITY): Payer: Self-pay

## 2020-10-13 MED FILL — Apixaban Tab 5 MG: ORAL | 30 days supply | Qty: 60 | Fill #3 | Status: AC

## 2020-10-28 ENCOUNTER — Other Ambulatory Visit (HOSPITAL_COMMUNITY): Payer: Self-pay

## 2020-11-11 ENCOUNTER — Encounter: Payer: Medicare Other | Admitting: Nurse Practitioner

## 2020-11-11 ENCOUNTER — Other Ambulatory Visit: Payer: Self-pay

## 2020-11-12 ENCOUNTER — Other Ambulatory Visit (HOSPITAL_COMMUNITY): Payer: Self-pay

## 2020-11-12 MED FILL — Apixaban Tab 5 MG: ORAL | 30 days supply | Qty: 60 | Fill #4 | Status: AC

## 2020-11-18 ENCOUNTER — Other Ambulatory Visit: Payer: Medicare Other

## 2020-11-18 ENCOUNTER — Ambulatory Visit: Payer: Medicare Other

## 2020-11-24 ENCOUNTER — Ambulatory Visit: Payer: Medicare Other

## 2020-11-25 ENCOUNTER — Encounter (HOSPITAL_COMMUNITY): Payer: Self-pay

## 2020-11-25 ENCOUNTER — Encounter (HOSPITAL_COMMUNITY): Payer: Self-pay | Admitting: Cardiovascular Disease

## 2020-11-25 ENCOUNTER — Encounter: Payer: Self-pay | Admitting: Nurse Practitioner

## 2020-11-25 ENCOUNTER — Encounter: Payer: Medicare Other | Admitting: Nurse Practitioner

## 2020-11-25 ENCOUNTER — Other Ambulatory Visit: Payer: Self-pay

## 2020-11-25 ENCOUNTER — Ambulatory Visit (HOSPITAL_COMMUNITY): Payer: Medicare Other | Attending: Internal Medicine

## 2020-11-25 DIAGNOSIS — Z0000 Encounter for general adult medical examination without abnormal findings: Secondary | ICD-10-CM

## 2020-11-25 NOTE — Progress Notes (Signed)
Verified appointment "no show" status with [name] at 13:57.

## 2020-11-25 NOTE — Patient Instructions (Addendum)
[name] , Thank you for taking time to come for your Medicare Wellness Visit. I appreciate your ongoing commitment to your health goals. Please review the following plan we discussed and let me know if I can assist you in the future.    Preventive Care 85 Years and Older, Female Preventive care refers to lifestyle choices and visits with your health care provider that can promote health and wellness. What does preventive care include? A yearly physical exam. This is also called an annual well check. Dental exams once or twice a year. Routine eye exams. Ask your health care provider how often you should have your eyes checked. Personal lifestyle choices, including: Daily care of your teeth and gums. Regular physical activity. Eating a healthy diet. Avoiding tobacco and drug use. Limiting alcohol use. Practicing safe sex. Taking low-dose aspirin every day. Taking vitamin and mineral supplements as recommended by your health care provider. What happens during an annual well check? The services and screenings done by your health care provider during your annual well check will depend on your age, overall health, lifestyle risk factors, and family history of disease. Counseling  Your health care provider may ask you questions about your: Alcohol use. Tobacco use. Drug use. Emotional well-being. Home and relationship well-being. Sexual activity. Eating habits. History of falls. Memory and ability to understand (cognition). Work and work Statistician. Reproductive health. Screening  You may have the following tests or measurements: Height, weight, and BMI. Blood pressure. Lipid and cholesterol levels. These may be checked every 5 years, or more frequently if you are over 85 years old. Skin check. Lung cancer screening. You may have this screening every year starting at age 85 if you have a 30-pack-year history of smoking and currently smoke or have quit within the past 15  years. Fecal occult blood test (FOBT) of the stool. You may have this test every year starting at age 85. Flexible sigmoidoscopy or colonoscopy. You may have a sigmoidoscopy every 5 years or a colonoscopy every 10 years starting at age 85. Hepatitis C blood test. Hepatitis B blood test. Sexually transmitted disease (STD) testing. Diabetes screening. This is done by checking your blood sugar (glucose) after you have not eaten for a while (fasting). You may have this done every 1-3 years. Bone density scan. This is done to screen for osteoporosis. You may have this done starting at age 85. Mammogram. This may be done every 1-2 years. Talk to your health care provider about how often you should have regular mammograms. Talk with your health care provider about your test results, treatment options, and if necessary, the need for more tests. Vaccines  Your health care provider may recommend certain vaccines, such as: Influenza vaccine. This is recommended every year. Tetanus, diphtheria, and acellular pertussis (Tdap, Td) vaccine. You may need a Td booster every 10 years. Zoster vaccine. You may need this after age 85. Pneumococcal 13-valent conjugate (PCV13) vaccine. One dose is recommended after age 85. Pneumococcal polysaccharide (PPSV23) vaccine. One dose is recommended after age 85. Talk to your health care provider about which screenings and vaccines you need and how often you need them. This information is not intended to replace advice given to you by your health care provider. Make sure you discuss any questions you have with your health care provider. Document Released: 03/14/2015 Document Revised: 11/05/2015 Document Reviewed: 12/17/2014 Elsevier Interactive Patient Education  2017 [place] Prevention in the Home Falls can cause injuries. They can happen  to people of all ages. There are many things you can do to make your home safe and to help prevent falls. What can I do on  the outside of my home? Regularly fix the edges of walkways and driveways and fix any cracks. Remove anything that might make you trip as you walk through a door, such as a raised step or threshold. Trim any bushes or trees on the path to your home. Use bright outdoor lighting. Clear any walking paths of anything that might make someone trip, such as rocks or tools. Regularly check to see if handrails are loose or broken. Make sure that both sides of any steps have handrails. Any raised decks and porches should have guardrails on the edges. Have any leaves, snow, or ice cleared regularly. Use sand or salt on walking paths during winter. Clean up any spills in your garage right away. This includes oil or grease spills. What can I do in the bathroom? Use night lights. Install grab bars by the toilet and in the tub and shower. Do not use towel bars as grab bars. Use non-skid mats or decals in the tub or shower. If you need to sit down in the shower, use a plastic, non-slip stool. Keep the floor dry. Clean up any water that spills on the floor as soon as it happens. Remove soap buildup in the tub or shower regularly. Attach bath mats securely with double-sided non-slip rug tape. Do not have throw rugs and other things on the floor that can make you trip. What can I do in the bedroom? Use night lights. Make sure that you have a light by your bed that is easy to reach. Do not use any sheets or blankets that are too big for your bed. They should not hang down onto the floor. Have a firm chair that has side arms. You can use this for support while you get dressed. Do not have throw rugs and other things on the floor that can make you trip. What can I do in the kitchen? Clean up any spills right away. Avoid walking on wet floors. Keep items that you use a lot in easy-to-reach places. If you need to reach something above you, use a strong step stool that has a grab bar. Keep electrical cords out  of the way. Do not use floor polish or wax that makes floors slippery. If you must use wax, use non-skid floor wax. Do not have throw rugs and other things on the floor that can make you trip. What can I do with my stairs? Do not leave any items on the stairs. Make sure that there are handrails on both sides of the stairs and use them. Fix handrails that are broken or loose. Make sure that handrails are as long as the stairways. Check any carpeting to make sure that it is firmly attached to the stairs. Fix any carpet that is loose or worn. Avoid having throw rugs at the top or bottom of the stairs. If you do have throw rugs, attach them to the floor with carpet tape. Make sure that you have a light switch at the top of the stairs and the bottom of the stairs. If you do not have them, ask someone to add them for you. What else can I do to help prevent falls? Wear shoes that: Do not have high heels. Have rubber bottoms. Are comfortable and fit you well. Are closed at the toe. Do not wear sandals. If  you use a stepladder: Make sure that it is fully opened. Do not climb a closed stepladder. Make sure that both sides of the stepladder are locked into place. Ask someone to hold it for you, if possible. Clearly mark and make sure that you can see: Any grab bars or handrails. First and last steps. Where the edge of each step is. Use tools that help you move around (mobility aids) if they are needed. These include: Canes. Walkers. Scooters. Crutches. Turn on the lights when you go into a dark area. Replace any light bulbs as soon as they burn out. Set up your furniture so you have a clear path. Avoid moving your furniture around. If any of your floors are uneven, fix them. If there are any pets around you, be aware of where they are. Review your medicines with your doctor. Some medicines can make you feel dizzy. This can increase your chance of falling. Ask your doctor what other things that  you can do to help prevent falls. This information is not intended to replace advice given to you by your health care provider. Make sure you discuss any questions you have with your health care provider. Document Released: 12/12/2008 Document Revised: 07/24/2015 Document Reviewed: 03/22/2014 Elsevier Interactive Patient Education  2017 [employer].

## 2020-11-25 NOTE — Progress Notes (Signed)
   This service is provided via telemedicine  No vital signs collected/recorded due to the encounter was a telemedicine visit.   Location of patient (ex: home, work):  Home  Patient consents to a telephone visit: Yes, see telephone visit dated 11/25/20  Location of the provider (ex: office, home):  [facility], Office   Name of any referring provider:  N/A  Names of all persons participating in the telemedicine service and their role in the encounter:  S.Chrae B/CMA, Sherrie Mustache, NP, and Patient   Time spent on call:  9 min with medical assistant

## 2020-11-25 NOTE — Progress Notes (Deleted)
Subjective:   [name] is a 87 y.o. female who presents for Medicare Annual (Subsequent) preventive examination.  Review of Systems           Objective:    There were no vitals filed for this visit. There is no height or weight on file to calculate BMI.  Advanced Directives 11/25/2020 04/02/2020 07/03/2019 06/20/2019 05/03/2019 08/31/2018 05/31/2018  Does Patient Have a Medical Advance Directive? No No No No No No No  Would patient like information on creating a medical advance directive? Yes (MAU/Ambulatory/Procedural Areas - Information given) No - Patient declined Yes (MAU/Ambulatory/Procedural Areas - Information given) Yes (MAU/Ambulatory/Procedural Areas - Information given) - No - Patient declined Yes (MAU/Ambulatory/Procedural Areas - Information given)  Pre-existing out of facility DNR order (yellow form or pink MOST form) - - - - - - -    Current Medications (verified) Outpatient Encounter Medications as of 11/25/2020  Medication Sig   acetaminophen (TYLENOL) 325 MG tablet Take 650 mg by mouth at bedtime as needed (pain).   acetaminophen (TYLENOL) 650 MG CR tablet Take 650 mg by mouth every morning.   albuterol (PROAIR HFA) 108 (90 Base) MCG/ACT inhaler INHALE 1 PUFF INTO THE LUNGS EVERY 6 (SIX) HOURS AS NEEDED FOR WHEEZING OR SHORTNESS OF BREATH.   amoxicillin (AMOXIL) 500 MG tablet Take 4 by mouth 1 hour prior to dental procedure   apixaban (ELIQUIS) 5 MG TABS tablet TAKE 1 TABLET BY MOUTH TWICE DAILY.   Ascorbic Acid (VITAMIN C) 100 MG tablet Take 100 mg by mouth daily.   atorvastatin (LIPITOR) 40 MG tablet Take 1 tablet (40 mg total) by mouth daily.   Calcium Citrate-Vitamin D (CITRACAL + D PO) Take 1 tablet by mouth 2 (two) times daily.    Chlorpheniramine Maleate (EQ CHLORTABS PO) Take 1 tablet by mouth every 4 (four) hours as needed.   dextromethorphan-guaiFENesin (MUCINEX DM) 30-600 MG 12hr tablet Take 1 tablet by mouth 2 (two) times daily as needed.    famotidine  (PEPCID) 20 MG tablet One after supper   fluticasone (FLONASE) 50 MCG/ACT nasal spray PLACE 1 SPRAY INTO BOTH NOSTRILS TWICE DAILY AS NEEDED FOR ALLERGIES OR RHINITIS.   hydrOXYzine (ATARAX/VISTARIL) 25 MG tablet Take one tablet by mouth daily as needed for itching.   Magnesium Hydroxide (PHILLIPS MILK OF MAGNESIA PO) Take 15 mL by mouth. Take daily at 6 o'clock at night   metoprolol tartrate (LOPRESSOR) 25 MG tablet TAKE 1 TABLET (25 MG TOTAL) BY MOUTH 2 (TWO) TIMES DAILY.   mirabegron ER (MYRBETRIQ) 50 MG TB24 tablet Take 1 tablet (50 mg total) by mouth daily.   Omega-3 Fatty Acids (FISH OIL PO) Take 1 capsule by mouth daily.   omeprazole (PRILOSEC) 20 MG capsule Take 1 capsule (20 mg total) by mouth daily.   shark liver oil-cocoa butter (PREPARATION H) 0.25-3-85.5 % suppository Place 1 suppository rectally as needed for hemorrhoids.   Specialty Vitamins Products (ADVANCED COLLAGEN PO) Take 1,000 mg by mouth daily.   valsartan (DIOVAN) 160 MG tablet Take 1 tablet (160 mg total) by mouth in the morning.   valsartan (DIOVAN) 80 MG tablet Take 1 tablet (80 mg total) by mouth every evening.   vitamin E 400 UNIT capsule Take 400 Units by mouth daily.   No facility-administered encounter medications on file as of 11/25/2020.    Allergies (verified) Lisinopril, Ceftriaxone, and Codeine   History: Past Medical History:  Diagnosis Date   Aortic stenosis 07/20/2012   Aortic stenosis  Aortic valve disorder 08/13/2011   ECHO - EF >79%; mild diastolic dysfunction; calcified aortic valve, not well visualized; mod/severe aortic stenosis w/ worsening gradients when compared to 2012   Arthritis    Cancer ([place])    Breast   Carotid bruit 08/06/2008   Doppler - R ECA demonstrates noarrowing w/ elevated velocities consistent w/ >70% diameter reduction; R and L ICAs show no evidence of diameter reduction, significant tortuosity or vascular abnormality;    Change in voice    COPD (chronic obstructive  pulmonary disease) (HCC)    Hearing loss    Heart murmur    Hyperlipidemia    Hypertension    dr Gwenlyn Found   Osteoporosis    PAF (paroxysmal atrial fibrillation) ([place]) 09/04/2012   PVD (peripheral vascular disease) ([place]) 08/13/2010   R/P MV - normal pattern of perfusion in all regions, EF 76%; no significant wall abnormalities noted; normal perfusion study   Right carotid bruit    high-grade right external carotid artery stenosis by 2 parts ultrasound 2 years ago   S/P aortic valve replacement with bioprosthetic valve 08/30/2012   21 mm [facility] Ease bovine pericardial tissue valve   Stroke (cerebrum) [facility])    Past Surgical History:  Procedure Laterality Date   ABDOMINAL HYSTERECTOMY     Partial   AORTIC VALVE REPLACEMENT N/A 08/30/2012   Procedure: AORTIC VALVE REPLACEMENT (AVR);  Surgeon: Rexene Alberts, MD;  Location: [place];  Service: Open Heart Surgery;  Laterality: N/A;   BIOPSY SHOULDER Left 10/08/2005   shave biopsy   BREAST LUMPECTOMY  02/25/1997   right   [place] Left 1997   Breast implant   EYE SURGERY  1997   Cataract surgery   INTRAOPERATIVE TRANSESOPHAGEAL ECHOCARDIOGRAM N/A 08/30/2012   Procedure: INTRAOPERATIVE TRANSESOPHAGEAL ECHOCARDIOGRAM;  Surgeon: Rexene Alberts, MD;  Location: [place];  Service: Open Heart Surgery;  Laterality: N/A;   LEFT AND RIGHT HEART CATHETERIZATION WITH CORONARY ANGIOGRAM N/A 08/14/2012   Procedure: LEFT AND RIGHT HEART CATHETERIZATION WITH CORONARY ANGIOGRAM;  Surgeon: Lorretta Harp, MD;  Location: [facility] CATH LAB;  Service: Cardiovascular;  Laterality: N/A;   LEFT HEART CATH  08/14/12   Nl cors, AS   MASTECTOMY  09/29/95   left   PARTIAL HYSTERECTOMY     TOTAL HIP ARTHROPLASTY  09/2010   Family History  Problem Relation Age of Onset   Cancer Mother        Bladder   Early death Father        Tractor accident   Early death Brother        Radiation protection practitioner   Early death Brother         during heart surgery   Social History   Socioeconomic History   Marital status: Widowed    Spouse name: Not on file   Number of children: 2   Years of education: 9   Highest education level: Not on file  Occupational History   Occupation: retired  Tobacco Use   Smoking status: Never   Smokeless tobacco: Never  Vaping Use   Vaping Use: Never used  Substance and Sexual Activity   Alcohol use: No   Drug use: No   Sexual activity: Never  Other Topics Concern   Not on file  Social History Narrative   Right handed   One story home   Social Determinants of Health   Financial Resource Strain:  Not on file  Food Insecurity: Not on file  Transportation Needs: Not on file  Physical Activity: Not on file  Stress: Not on file  Social Connections: Not on file    Tobacco Counseling Counseling given: Not Answered   Clinical Intake:                 Diabetic?no         Activities of Daily Living No flowsheet data found.  Patient Care Team: Lauree Chandler, NP as PCP - General (Geriatric Medicine) Druscilla Brownie, MD as Consulting Physician (Dermatology) [name] Irani, MD (Inactive) as Consulting Physician (Gastroenterology) Neldon Mc, MD as Consulting Physician (General Surgery) Lorretta Harp, MD as Consulting Physician (Cardiology) Bjorn Loser, MD as Consulting Physician (Urology) Pieter Partridge, DO as Consulting Physician (Neurology)  Indicate any recent Medical Services you may have received from other than [facility] providers in the past year (date may be approximate).     Assessment:   This is a routine wellness examination for [place].  Hearing/Vision screen Hearing Screening - Comments:: Wears hearing aids, wears off/on.  Vision Screening - Comments:: Last eye exam less than 12 months ago. Patient not aware of doctors name   Dietary issues and exercise activities discussed:     Goals Addressed   None    Depression Screen PHQ  2/9 Scores 11/25/2020 10/01/2020 04/02/2020 07/03/2019 06/20/2019 05/31/2018 08/09/2017  PHQ - 2 Score 0 0 0 0 0 0 0    Fall Risk Fall Risk  11/25/2020 10/01/2020 07/03/2019 06/20/2019 05/14/2019  Falls in the past year? 0 0 1 1 1   Number falls in past yr: 0 0 0 0 0  Injury with Fall? 0 0 0 0 0  Risk for fall due to : No Fall Risks No Fall Risks - - -  Follow up Falls evaluation completed Falls evaluation completed - - -    FALL RISK PREVENTION PERTAINING TO THE HOME:  Any stairs in or around the home? {YES/NO:21197} If so, are there any without handrails? {YES/NO:21197} Home free of loose throw rugs in walkways, pet beds, electrical cords, etc? {YES/NO:21197} Adequate lighting in your home to reduce risk of falls? {YES/NO:21197}  ASSISTIVE DEVICES UTILIZED TO PREVENT FALLS:  Life alert? {YES/NO:21197} Use of a cane, walker or w/c? {YES/NO:21197} Grab bars in the bathroom? {YES/NO:21197} Shower chair or bench in shower? {YES/NO:21197} Elevated toilet seat or a handicapped toilet? {YES/NO:21197}  TIMED UP AND GO:  Was the test performed? No .   Cognitive Function: MMSE - Mini Mental State Exam 05/23/2017 02/04/2016 11/27/2014  Orientation to time 5 5 5   Orientation to Place 5 5 5   Registration 3 3 3   Attention/ Calculation 5 4 4   Recall 1 2 2   Language- name 2 objects 2 2 2   Language- repeat 1 1 1   Language- follow 3 step command 3 3 3   Language- read & follow direction 1 0 1  Write a sentence 1 0 1  Copy design 1 1 1   Total score 28 26 28      6CIT Screen 07/03/2019 05/31/2018  What Year? 0 points 0 points  What month? 0 points 0 points  What time? 0 points 0 points  Count back from 20 0 points 0 points  Months in reverse 0 points 0 points  Repeat phrase 2 points 4 points  Total Score 2 4    Immunizations Immunization History  Administered Date(s) Administered   Fluad Quad(high Dose 65+) 12/20/2018, 11/12/2019  Influenza,inj,Quad PF,6+ Mos 11/29/2012, 11/27/2013, 11/27/2014,  12/09/2015, 01/11/2017, 12/20/2017   PFIZER(Purple Top)SARS-COV-2 Vaccination 11/26/2019, 12/17/2019, 07/14/2020   Pneumococcal Conjugate-13 05/21/2014   Pneumococcal Polysaccharide-23 01/11/2005, 08/15/2012   Tdap 05/28/2014   Zoster Recombinat (Shingrix) 06/24/2017, 11/02/2017   Zoster, Live 01/14/2009    TDAP status: Up to date  Flu Vaccine status: Due, Education has been provided regarding the importance of this vaccine. Advised may receive this vaccine at local pharmacy or Health Dept. Aware to provide a copy of the vaccination record if obtained from local pharmacy or Health Dept. Verbalized acceptance and understanding.  Pneumococcal vaccine status: Up to date  Covid-19 vaccine status: Information provided on how to obtain vaccines.   Qualifies for Shingles Vaccine? Yes   Zostavax completed Yes   Shingrix Completed?: Yes  Screening Tests Health Maintenance  Topic Date Due   INFLUENZA VACCINE  09/29/2020   COVID-19 Vaccine (4 - Booster for Pfizer series) 10/06/2020   TETANUS/TDAP  05/27/2024   DEXA SCAN  Completed   Zoster Vaccines- Shingrix  Completed   HPV VACCINES  Aged Out    Health Maintenance  Health Maintenance Due  Topic Date Due   INFLUENZA VACCINE  09/29/2020   COVID-19 Vaccine (4 - Booster for [place] series) 10/06/2020    Colorectal cancer screening: No longer required.   Mammogram status: No longer required due to age.  Bone Density status: Ordered and completed. Pt provided with contact info and advised to call to schedule appt.  Lung Cancer Screening: (Low Dose CT Chest recommended if Age 66-80 years, 30 pack-year currently smoking OR have quit w/in 15years.) does not qualify.   Lung Cancer Screening Referral: na  Additional Screening:  Hepatitis C Screening: does not qualify; Completed na  Vision Screening: Recommended annual ophthalmology exams for early detection of glaucoma and other disorders of the eye. Is the patient up to date with  their annual eye exam?  Yes  Who is the provider or what is the name of the office in which the patient attends annual eye exams? ? Unknown  If pt is not established with a provider, would they like to be referred to a provider to establish care? No .   Dental Screening: Recommended annual dental exams for proper oral hygiene  Community Resource Referral / Chronic Care Management: CRR required this visit?  No   CCM required this visit?  No      Plan:     I have personally reviewed and noted the following in the patient's chart:   Medical and social history Use of alcohol, tobacco or illicit drugs  Current medications and supplements including opioid prescriptions.  Functional ability and status Nutritional status Physical activity Advanced directives List of other physicians Hospitalizations, surgeries, and ER visits in previous 12 months Vitals Screenings to include cognitive, depression, and falls Referrals and appointments  In addition, I have reviewed and discussed with patient certain preventive protocols, quality metrics, and best practice recommendations. A written personalized care plan for preventive services as well as general preventive health recommendations were provided to patient.     Lauree Chandler, NP   11/25/2020    Virtual Visit via Telephone Note  I connected withNAME@ on 11/25/20 at  3:45 PM EDT by telephone and verified that I am speaking with the correct person using two identifiers.  Location: Patient: home Provider: [place]   I discussed the limitations, risks, security and privacy concerns of performing an evaluation and management service by telephone and the  availability of in person appointments. I also discussed with the patient that there may be a patient responsible charge related to this service. The patient expressed understanding and agreed to proceed.   I discussed the assessment and treatment plan with the patient. The patient  was provided an opportunity to ask questions and all were answered. The patient agreed with the plan and demonstrated an understanding of the instructions.   The patient was advised to call back or seek an in-person evaluation if the symptoms worsen or if the condition fails to improve as anticipated.  I provided 15 minutes of non-face-to-face time during this encounter.  Carlos American. Harle Battiest Avs printed and mailed

## 2020-11-28 ENCOUNTER — Other Ambulatory Visit: Payer: Self-pay

## 2020-11-28 ENCOUNTER — Ambulatory Visit (INDEPENDENT_AMBULATORY_CARE_PROVIDER_SITE_OTHER): Payer: Medicare Other | Admitting: Nurse Practitioner

## 2020-11-28 DIAGNOSIS — Z20822 Contact with and (suspected) exposure to covid-19: Secondary | ICD-10-CM | POA: Diagnosis not present

## 2020-11-28 DIAGNOSIS — Z0000 Encounter for general adult medical examination without abnormal findings: Secondary | ICD-10-CM

## 2020-11-28 NOTE — Progress Notes (Signed)
Subjective:   [name] is a 87 y.o. female who presents for Medicare Annual (Subsequent) preventive examination.  Review of Systems     Cardiac Risk Factors include: advanced age (>8men, >29 women);hypertension;family history of premature cardiovascular disease     Objective:    There were no vitals filed for this visit. There is no height or weight on file to calculate BMI.  Advanced Directives 11/28/2020 11/25/2020 04/02/2020 07/03/2019 06/20/2019 05/03/2019 08/31/2018  Does Patient Have a Medical Advance Directive? No No No No No No No  Does patient want to make changes to medical advance directive? Yes (MAU/Ambulatory/Procedural Areas - Information given) - - - - - -  Would patient like information on creating a medical advance directive? - Yes (MAU/Ambulatory/Procedural Areas - Information given) No - Patient declined Yes (MAU/Ambulatory/Procedural Areas - Information given) Yes (MAU/Ambulatory/Procedural Areas - Information given) - No - Patient declined  Pre-existing out of facility DNR order (yellow form or pink MOST form) - - - - - - -    Current Medications (verified) Outpatient Encounter Medications as of 11/28/2020  Medication Sig   acetaminophen (TYLENOL) 325 MG tablet Take 650 mg by mouth at bedtime as needed (pain).   acetaminophen (TYLENOL) 650 MG CR tablet Take 650 mg by mouth every morning.   albuterol (PROAIR HFA) 108 (90 Base) MCG/ACT inhaler INHALE 1 PUFF INTO THE LUNGS EVERY 6 (SIX) HOURS AS NEEDED FOR WHEEZING OR SHORTNESS OF BREATH.   amoxicillin (AMOXIL) 500 MG tablet Take 4 by mouth 1 hour prior to dental procedure   apixaban (ELIQUIS) 5 MG TABS tablet TAKE 1 TABLET BY MOUTH TWICE DAILY.   Ascorbic Acid (VITAMIN C) 100 MG tablet Take 100 mg by mouth daily.   atorvastatin (LIPITOR) 40 MG tablet Take 1 tablet (40 mg total) by mouth daily.   Calcium Citrate-Vitamin D (CITRACAL + D PO) Take 1 tablet by mouth 2 (two) times daily.    Chlorpheniramine Maleate (EQ  CHLORTABS PO) Take 1 tablet by mouth every 4 (four) hours as needed.   dextromethorphan-guaiFENesin (MUCINEX DM) 30-600 MG 12hr tablet Take 1 tablet by mouth 2 (two) times daily as needed.    famotidine (PEPCID) 20 MG tablet One after supper   fluticasone (FLONASE) 50 MCG/ACT nasal spray PLACE 1 SPRAY INTO BOTH NOSTRILS TWICE DAILY AS NEEDED FOR ALLERGIES OR RHINITIS.   hydrOXYzine (ATARAX/VISTARIL) 25 MG tablet Take one tablet by mouth daily as needed for itching.   Magnesium Hydroxide (PHILLIPS MILK OF MAGNESIA PO) Take 15 mL by mouth. Take daily at 6 o'clock at night   metoprolol tartrate (LOPRESSOR) 25 MG tablet TAKE 1 TABLET (25 MG TOTAL) BY MOUTH 2 (TWO) TIMES DAILY.   mirabegron ER (MYRBETRIQ) 50 MG TB24 tablet Take 1 tablet (50 mg total) by mouth daily.   Omega-3 Fatty Acids (FISH OIL PO) Take 1 capsule by mouth daily.   omeprazole (PRILOSEC) 20 MG capsule Take 1 capsule (20 mg total) by mouth daily.   shark liver oil-cocoa butter (PREPARATION H) 0.25-3-85.5 % suppository Place 1 suppository rectally as needed for hemorrhoids.   Specialty Vitamins Products (ADVANCED COLLAGEN PO) Take 1,000 mg by mouth daily.   valsartan (DIOVAN) 160 MG tablet Take 1 tablet (160 mg total) by mouth in the morning.   valsartan (DIOVAN) 80 MG tablet Take 1 tablet (80 mg total) by mouth every evening.   vitamin E 400 UNIT capsule Take 400 Units by mouth daily.   No facility-administered encounter medications on file  as of 11/28/2020.    Allergies (verified) Lisinopril, Ceftriaxone, and Codeine   History: Past Medical History:  Diagnosis Date   Aortic stenosis 07/20/2012   Aortic stenosis    Aortic valve disorder 08/13/2011   ECHO - EF >62%; mild diastolic dysfunction; calcified aortic valve, not well visualized; mod/severe aortic stenosis w/ worsening gradients when compared to 2012   Arthritis    Cancer ([place])    Breast   Carotid bruit 08/06/2008   Doppler - R ECA demonstrates noarrowing w/ elevated  velocities consistent w/ >70% diameter reduction; R and L ICAs show no evidence of diameter reduction, significant tortuosity or vascular abnormality;    Change in voice    COPD (chronic obstructive pulmonary disease) (HCC)    Hearing loss    Heart murmur    Hyperlipidemia    Hypertension    dr Gwenlyn Found   Osteoporosis    PAF (paroxysmal atrial fibrillation) ([place]) 09/04/2012   PVD (peripheral vascular disease) ([place]) 08/13/2010   R/P MV - normal pattern of perfusion in all regions, EF 76%; no significant wall abnormalities noted; normal perfusion study   Right carotid bruit    high-grade right external carotid artery stenosis by 2 parts ultrasound 2 years ago   S/P aortic valve replacement with bioprosthetic valve 08/30/2012   21 mm [facility] Ease bovine pericardial tissue valve   Stroke (cerebrum) [facility])    Past Surgical History:  Procedure Laterality Date   ABDOMINAL HYSTERECTOMY     Partial   AORTIC VALVE REPLACEMENT N/A 08/30/2012   Procedure: AORTIC VALVE REPLACEMENT (AVR);  Surgeon: Rexene Alberts, MD;  Location: [place];  Service: Open Heart Surgery;  Laterality: N/A;   BIOPSY SHOULDER Left 10/08/2005   shave biopsy   BREAST LUMPECTOMY  02/25/1997   right   [place] Left 1997   Breast implant   EYE SURGERY  1997   Cataract surgery   INTRAOPERATIVE TRANSESOPHAGEAL ECHOCARDIOGRAM N/A 08/30/2012   Procedure: INTRAOPERATIVE TRANSESOPHAGEAL ECHOCARDIOGRAM;  Surgeon: Rexene Alberts, MD;  Location: [place];  Service: Open Heart Surgery;  Laterality: N/A;   LEFT AND RIGHT HEART CATHETERIZATION WITH CORONARY ANGIOGRAM N/A 08/14/2012   Procedure: LEFT AND RIGHT HEART CATHETERIZATION WITH CORONARY ANGIOGRAM;  Surgeon: Lorretta Harp, MD;  Location[facility] C[facility] LAB;  Service: Cardiovascular;  Laterality: N/A;   LEFT HEART CATH  08/14/12   Nl cors, AS   MASTECTOMY  09/29/95   left   PARTIAL HYSTERECTOMY     TOTAL HIP ARTHROPLASTY  09/2010    Family History  Problem Relation Age of Onset   Cancer Mother        Bladder   Early death Father        Tractor accident   Early death Brother        Radiation protection practitioner   Early death Brother        during heart surgery   Social History   Socioeconomic History   Marital status: Widowed    Spouse name: Not on file   Number of children: 2   Years of education: 9   Highest education level: Not on file  Occupational History   Occupation: retired  Tobacco Use   Smoking status: Never   Smokeless tobacco: Never  Vaping Use   Vaping Use: Never used  Substance and Sexual Activity   Alcohol use: No   Drug use: No   Sexual activity: Never  Other Topics Concern   Not on file  Social History Narrative   Right handed   One story home   Social Determinants of Health   Financial Resource Strain: Not on file  Food Insecurity: Not on file  Transportation Needs: Not on file  Physical Activity: Not on file  Stress: Not on file  Social Connections: Not on file    Tobacco Counseling Counseling given: Not Answered   Clinical Intake:     Pain : No/denies pain     BMI - recorded: 26 Nutritional Status: BMI 25 -29 Overweight Nutritional Risks: None Diabetes: No  How often do you need to have someone help you when you read instructions, pamphlets, or other written materials from your doctor or pharmacy?: 3 - Sometimes  Diabetic?no         Activities of Daily Living In your present state of health, do you have any difficulty performing the following activities: 11/28/2020 11/28/2020  Hearing? [place]? N -  Difficulty concentrating or making decisions? N -  Walking or climbing stairs? N N  Dressing or bathing? N -  Doing errands, shopping? Y -  Conservation officer, nature and eating ? N -  Using the Toilet? N -  In the past six months, have you accidently leaked urine? N -  Do you have problems with loss of bowel control? N -  Managing your Medications? N -  Comment Daughter  helps -  Managing your Finances? N -  Housekeeping or managing your Housekeeping? N -  Some recent data might be hidden    Patient Care Team: Lauree Chandler, NP as PCP - General (Geriatric Medicine) Druscilla Brownie, MD as Consulting Physician (Dermatology) [name] Irani, MD (Inactive) as Consulting Physician (Gastroenterology) Neldon Mc, MD as Consulting Physician (General Surgery) Lorretta Harp, MD as Consulting Physician (Cardiology) Bjorn Loser, MD as Consulting Physician (Urology) Pieter Partridge, DO as Consulting Physician (Neurology)  Indicate any recent Medical Services you may have received from other than [facility] providers in the past year (date may be approximate).     Assessment:   This is a routine wellness examination for Yarnell.  Hearing/Vision screen Hearing Screening - Comments:: Wears hearing aids, wears off/on.  Vision Screening - Comments:: Last eye exam was less than 12 months ago. Patient unable to recall the name of doctor.   Dietary issues and exercise activities discussed: Current Exercise Habits: The patient does not participate in regular exercise at present   Goals Addressed   None    Depression Screen PHQ 2/9 Scores 11/28/2020 11/25/2020 10/01/2020 04/02/2020 07/03/2019 06/20/2019 05/31/2018  PHQ - 2 Score 0 0 0 0 0 0 0    Fall Risk Fall Risk  11/28/2020 11/25/2020 10/01/2020 07/03/2019 06/20/2019  Falls in the past year? 0 0 0 1 1  Number falls in past yr: 0 0 0 0 0  Injury with Fall? 0 0 0 0 0  Risk for fall due to : No Fall Risks No Fall Risks No Fall Risks - -  Follow up Falls evaluation completed Falls evaluation completed Falls evaluation completed - -    FALL RISK PREVENTION PERTAINING TO THE HOME:  Any stairs in or around the home? Yes  If so, are there any without handrails? No  Home free of loose throw rugs in walkways, pet beds, electrical cords, etc? Yes  Adequate lighting in your home to reduce risk of falls? Yes   ASSISTIVE  DEVICES UTILIZED TO PREVENT FALLS:  Life alert? No  Use of a cane, walker or w/c? Yes  Grab bars in the bathroom? No  Shower chair or bench in shower? Yes  Elevated toilet seat or a handicapped toilet? No   TIMED UP AND GO:  Was the test performed? No .    Cognitive Function: MMSE - Mini Mental State Exam 05/23/2017 02/04/2016 11/27/2014  Orientation to time 5 5 5   Orientation to Place 5 5 5   Registration 3 3 3   Attention/ Calculation 5 4 4   Recall 1 2 2   Language- name 2 objects 2 2 2   Language- repeat 1 1 1   Language- follow 3 step command 3 3 3   Language- read & follow direction 1 0 1  Write a sentence 1 0 1  Copy design 1 1 1   Total score 28 26 28      6CIT Screen 11/28/2020 11/25/2020 07/03/2019 05/31/2018  What Year? 0 points 0 points 0 points 0 points  What month? 3 points 3 points 0 points 0 points  What time? 0 points 0 points 0 points 0 points  Count back from 20 0 points 0 points 0 points 0 points  Months in reverse 4 points 4 points 0 points 0 points  Repeat phrase 10 points 10 points 2 points 4 points  Total Score 17 17 2 4     Immunizations Immunization History  Administered Date(s) Administered   Fluad Quad(high Dose 65+) 12/20/2018, 11/12/2019   Influenza,inj,Quad PF,6+ Mos 11/29/2012, 11/27/2013, 11/27/2014, 12/09/2015, 01/11/2017, 12/20/2017   PFIZER(Purple Top)SARS-COV-2 Vaccination 11/26/2019, 12/17/2019, 07/14/2020   Pneumococcal Conjugate-13 05/21/2014   Pneumococcal Polysaccharide-23 01/11/2005, 08/15/2012   Tdap 05/28/2014   Zoster Recombinat (Shingrix) 06/24/2017, 11/02/2017   Zoster, Live 01/14/2009    TDAP status: Up to date  Flu Vaccine status: Due, Education has been provided regarding the importance of this vaccine. Advised may receive this vaccine at local pharmacy or Health Dept. Aware to provide a copy of the vaccination record if obtained from local pharmacy or Health Dept. Verbalized acceptance and understanding.  Pneumococcal vaccine  status: Up to date  Covid-19 vaccine status: Information provided on how to obtain vaccines.   Qualifies for Shingles Vaccine? Yes   Zostavax completed Yes   Shingrix Completed?: Yes  Screening Tests Health Maintenance  Topic Date Due   INFLUENZA VACCINE  09/29/2020   COVID-19 Vaccine (4 - Booster for Pfizer series) 10/06/2020   TETANUS/TDAP  05/27/2024   DEXA SCAN  Completed   Zoster Vaccines- Shingrix  Completed   HPV VACCINES  Aged Out    Health Maintenance  Health Maintenance Due  Topic Date Due   INFLUENZA VACCINE  09/29/2020   COVID-19 Vaccine (4 - Booster for [place] series) 10/06/2020    Colorectal cancer screening: No longer required.   Mammogram status: No longer required due to age.  Bone Density status: Ordered and scheduled. Pt provided with contact info and advised to call to schedule appt.  Lung Cancer Screening: (Low Dose CT Chest recommended if Age 56-80 years, 30 pack-year currently smoking OR have quit w/in 15years.) does not qualify.   Lung Cancer Screening Referral: na  Additional Screening:  Hepatitis C Screening: does not qualify;   Vision Screening: Recommended annual ophthalmology exams for early detection of glaucoma and other disorders of the eye. Is the patient up to date with their annual eye exam?  Yes  Who is the provider or what is the name of the office in which the patient attends annual eye exams? unknown  If pt is not established with a provider, would they like to be referred to a provider to establish care? No .   Dental Screening: Recommended annual dental exams for proper oral hygiene  Community Resource Referral / Chronic Care Management: CRR required this visit?  No   CCM required this visit?  No      Plan:     I have personally reviewed and noted the following in the patient's chart:   Medical and social history Use of alcohol, tobacco or illicit drugs  Current medications and supplements including opioid  prescriptions.  Functional ability and status Nutritional status Physical activity Advanced directives List of other physicians Hospitalizations, surgeries, and ER visits in previous 12 months Vitals Screenings to include cognitive, depression, and falls Referrals and appointments  In addition, I have reviewed and discussed with patient certain preventive protocols, quality metrics, and best practice recommendations. A written personalized care plan for preventive services as well as general preventive health recommendations were provided to patient.     Lauree Chandler, NP   11/28/2020    Virtual Visit via Telephone Note  I connected withNAME@ on 11/28/20 at  3:45 PM EDT by telephone and verified that I am speaking with the correct person using two identifiers.  Location: Patient: home Provider: office   I discussed the limitations, risks, security and privacy concerns of performing an evaluation and management service by telephone and the availability of in person appointments. I also discussed with the patient that there may be a patient responsible charge related to this service. The patient expressed understanding and agreed to proceed.   I discussed the assessment and treatment plan with the patient. The patient was provided an opportunity to ask questions and all were answered. The patient agreed with the plan and demonstrated an understanding of the instructions.   The patient was advised to call back or seek an in-person evaluation if the symptoms worsen or if the condition fails to improve as anticipated.  I provided 18 minutes of non-face-to-face time during this encounter.  Carlos American. Harle Battiest Avs printed and mailed

## 2020-11-28 NOTE — Patient Instructions (Addendum)
[name] , Thank you for taking time to come for your Medicare Wellness Visit. I appreciate your ongoing commitment to your health goals. Please review the following plan we discussed and let me know if I can assist you in the future.   Screening recommendations/referrals: Colonoscopy aged out Mammogram aged out Bone Density scheduled.  Recommended yearly ophthalmology/optometry visit for glaucoma screening and checkup Recommended yearly dental visit for hygiene and checkup  Vaccinations: Influenza vaccine RECOMMENDED to get at this time. Can get at office or pharmacy.  Pneumococcal vaccine up to date Tdap vaccine up to date Shingles vaccine up to date   COVID booster- RECOMMENDED to get at this time, to get at local pharmacy   Advanced directives: recommend to complete paperwork and bring back to office to place on file.   Conditions/risks identified: advanced age, fall risk, memory loss   Next appointment: yearly for AWV  Preventive Care 85 Years and Older, Female Preventive care refers to lifestyle choices and visits with your health care provider that can promote health and wellness. What does preventive care include? A yearly physical exam. This is also called an annual well check. Dental exams once or twice a year. Routine eye exams. Ask your health care provider how often you should have your eyes checked. Personal lifestyle choices, including: Daily care of your teeth and gums. Regular physical activity. Eating a healthy diet. Avoiding tobacco and drug use. Limiting alcohol use. Practicing safe sex. Taking low-dose aspirin every day. Taking vitamin and mineral supplements as recommended by your health care provider. What happens during an annual well check? The services and screenings done by your health care provider during your annual well check will depend on your age, overall health, lifestyle risk factors, and family history of disease. Counseling  Your health  care provider may ask you questions about your: Alcohol use. Tobacco use. Drug use. Emotional well-being. Home and relationship well-being. Sexual activity. Eating habits. History of falls. Memory and ability to understand (cognition). Work and work Statistician. Reproductive health. Screening  You may have the following tests or measurements: Height, weight, and BMI. Blood pressure. Lipid and cholesterol levels. These may be checked every 5 years, or more frequently if you are over 15 years old. Skin check. Lung cancer screening. You may have this screening every year starting at age 30 if you have a 30-pack-year history of smoking and currently smoke or have quit within the past 15 years. Fecal occult blood test (FOBT) of the stool. You may have this test every year starting at age 54. Flexible sigmoidoscopy or colonoscopy. You may have a sigmoidoscopy every 5 years or a colonoscopy every 10 years starting at age 33. Hepatitis C blood test. Hepatitis B blood test. Sexually transmitted disease (STD) testing. Diabetes screening. This is done by checking your blood sugar (glucose) after you have not eaten for a while (fasting). You may have this done every 1-3 years. Bone density scan. This is done to screen for osteoporosis. You may have this done starting at age 85. Mammogram. This may be done every 1-2 years. Talk to your health care provider about how often you should have regular mammograms. Talk with your health care provider about your test results, treatment options, and if necessary, the need for more tests. Vaccines  Your health care provider may recommend certain vaccines, such as: Influenza vaccine. This is recommended every year. Tetanus, diphtheria, and acellular pertussis (Tdap, Td) vaccine. You may need a Td booster every 10 years.  Zoster vaccine. You may need this after age 52. Pneumococcal 13-valent conjugate (PCV13) vaccine. One dose is recommended after age  80. Pneumococcal polysaccharide (PPSV23) vaccine. One dose is recommended after age 61. Talk to your health care provider about which screenings and vaccines you need and how often you need them. This information is not intended to replace advice given to you by your health care provider. Make sure you discuss any questions you have with your health care provider. Document Released: 03/14/2015 Document Revised: 11/05/2015 Document Reviewed: 12/17/2014 Elsevier Interactive Patient Education  2017 [place] Prevention in the Home Falls can cause injuries. They can happen to people of all ages. There are many things you can do to make your home safe and to help prevent falls. What can I do on the outside of my home? Regularly fix the edges of walkways and driveways and fix any cracks. Remove anything that might make you trip as you walk through a door, such as a raised step or threshold. Trim any bushes or trees on the path to your home. Use bright outdoor lighting. Clear any walking paths of anything that might make someone trip, such as rocks or tools. Regularly check to see if handrails are loose or broken. Make sure that both sides of any steps have handrails. Any raised decks and porches should have guardrails on the edges. Have any leaves, snow, or ice cleared regularly. Use sand or salt on walking paths during winter. Clean up any spills in your garage right away. This includes oil or grease spills. What can I do in the bathroom? Use night lights. Install grab bars by the toilet and in the tub and shower. Do not use towel bars as grab bars. Use non-skid mats or decals in the tub or shower. If you need to sit down in the shower, use a plastic, non-slip stool. Keep the floor dry. Clean up any water that spills on the floor as soon as it happens. Remove soap buildup in the tub or shower regularly. Attach bath mats securely with double-sided non-slip rug tape. Do not have throw  rugs and other things on the floor that can make you trip. What can I do in the bedroom? Use night lights. Make sure that you have a light by your bed that is easy to reach. Do not use any sheets or blankets that are too big for your bed. They should not hang down onto the floor. Have a firm chair that has side arms. You can use this for support while you get dressed. Do not have throw rugs and other things on the floor that can make you trip. What can I do in the kitchen? Clean up any spills right away. Avoid walking on wet floors. Keep items that you use a lot in easy-to-reach places. If you need to reach something above you, use a strong step stool that has a grab bar. Keep electrical cords out of the way. Do not use floor polish or wax that makes floors slippery. If you must use wax, use non-skid floor wax. Do not have throw rugs and other things on the floor that can make you trip. What can I do with my stairs? Do not leave any items on the stairs. Make sure that there are handrails on both sides of the stairs and use them. Fix handrails that are broken or loose. Make sure that handrails are as long as the stairways. Check any carpeting to make sure that  it is firmly attached to the stairs. Fix any carpet that is loose or worn. Avoid having throw rugs at the top or bottom of the stairs. If you do have throw rugs, attach them to the floor with carpet tape. Make sure that you have a light switch at the top of the stairs and the bottom of the stairs. If you do not have them, ask someone to add them for you. What else can I do to help prevent falls? Wear shoes that: Do not have high heels. Have rubber bottoms. Are comfortable and fit you well. Are closed at the toe. Do not wear sandals. If you use a stepladder: Make sure that it is fully opened. Do not climb a closed stepladder. Make sure that both sides of the stepladder are locked into place. Ask someone to hold it for you, if  possible. Clearly mark and make sure that you can see: Any grab bars or handrails. First and last steps. Where the edge of each step is. Use tools that help you move around (mobility aids) if they are needed. These include: Canes. Walkers. Scooters. Crutches. Turn on the lights when you go into a dark area. Replace any light bulbs as soon as they burn out. Set up your furniture so you have a clear path. Avoid moving your furniture around. If any of your floors are uneven, fix them. If there are any pets around you, be aware of where they are. Review your medicines with your doctor. Some medicines can make you feel dizzy. This can increase your chance of falling. Ask your doctor what other things that you can do to help prevent falls. This information is not intended to replace advice given to you by your health care provider. Make sure you discuss any questions you have with your health care provider. Document Released: 12/12/2008 Document Revised: 07/24/2015 Document Reviewed: 03/22/2014 Elsevier Interactive Patient Education  2017 [employer].

## 2020-11-28 NOTE — Progress Notes (Signed)
   This service is provided via telemedicine  No vital signs collected/recorded due to the encounter was a telemedicine visit.   Location of patient (ex: home, work):  Home  Patient consents to a telephone visit: Yes, see telephone visit dated 11/25/2020  Location of the provider (ex: office, home):  [facility], Office   Name of any referring provider:  N/A  Names of all persons participating in the telemedicine service and their role in the encounter:  Jasmine Dillard/RMA, Sherrie Mustache, NP, and Patient   Time spent on call:  9 min with medical assistant

## 2020-12-05 ENCOUNTER — Other Ambulatory Visit (HOSPITAL_COMMUNITY): Payer: Self-pay

## 2020-12-05 MED FILL — Metoprolol Tartrate Tab 25 MG: ORAL | 90 days supply | Qty: 180 | Fill #1 | Status: AC

## 2020-12-11 ENCOUNTER — Other Ambulatory Visit (HOSPITAL_COMMUNITY): Payer: Self-pay

## 2020-12-16 ENCOUNTER — Other Ambulatory Visit (HOSPITAL_COMMUNITY): Payer: Self-pay

## 2020-12-16 ENCOUNTER — Ambulatory Visit: Payer: Medicare Other | Admitting: Cardiovascular Disease

## 2020-12-16 ENCOUNTER — Other Ambulatory Visit: Payer: Self-pay | Admitting: Cardiovascular Disease

## 2020-12-16 MED ORDER — APIXABAN 5 MG PO TABS
5.0000 mg | ORAL_TABLET | Freq: Two times a day (BID) | ORAL | 1 refills | Status: DC
Start: 2020-12-16 — End: 2021-07-15
  Filled 2020-12-16: qty 60, 30d supply, fill #0
  Filled 2021-02-02: qty 60, 30d supply, fill #1
  Filled 2021-03-05: qty 60, 30d supply, fill #2
  Filled 2021-04-06: qty 60, 30d supply, fill #3
  Filled 2021-05-07: qty 60, 30d supply, fill #4
  Filled 2021-06-08: qty 60, 30d supply, fill #5

## 2020-12-16 NOTE — Telephone Encounter (Signed)
82f, 68.8kg, scr 1.10 02/25/20, lovw/berry 10/30/19

## 2020-12-22 ENCOUNTER — Ambulatory Visit: Payer: Medicare Other

## 2020-12-22 ENCOUNTER — Other Ambulatory Visit: Payer: Medicare Other

## 2020-12-30 ENCOUNTER — Other Ambulatory Visit (HOSPITAL_COMMUNITY): Payer: Self-pay

## 2020-12-30 MED ORDER — MYRBETRIQ 25 MG PO TB24
25.0000 mg | ORAL_TABLET | Freq: Every day | ORAL | 0 refills | Status: DC
  Filled 2020-12-30 – 2021-01-01 (×2): qty 30, 30d supply, fill #0

## 2020-12-31 ENCOUNTER — Other Ambulatory Visit (HOSPITAL_COMMUNITY): Payer: Self-pay

## 2020-12-31 ENCOUNTER — Telehealth: Payer: Self-pay

## 2020-12-31 NOTE — Telephone Encounter (Signed)
Patients son stopped by to discuss several concerns that his mother would deny if she was present, per [name].  #1 Patient is non-compliant about taking medications. Takes medications about 3-4 days per week. Patients son puts weekly medications together, yet patient will not take daily. [name] mentioned the patient thinks he is giving her too much medication although he is following the list that we gave at last visit.  #2 Patient is sleeping 12-13 hours per day, despite her saying its only 7-8 hours.   #3 Is there a time frame between when patient should take heart and b/p medication? Patient is currently taking one medication about 5-6 pm and takes the other about 11 pm.   #4 Last MMSE was 17/30, [name] questions what does that mean and how do they know when patient is developing Alzheimer's or memory issues.   [name] does not know how to assist his mother and is looking for direction on how to deal with the above issues, with the missing medication doses being of most importance.   [name] is asking that we return call to him @  [phone]  [name] is aware Janett Billow is out of office today and prefers for message to wait for her tomorrow.

## 2021-01-01 ENCOUNTER — Other Ambulatory Visit (HOSPITAL_COMMUNITY): Payer: Self-pay

## 2021-01-01 MED ORDER — MYRBETRIQ 50 MG PO TB24
50.0000 mg | ORAL_TABLET | Freq: Every day | ORAL | 3 refills | Status: DC
  Filled 2021-01-01: qty 30, 30d supply, fill #0
  Filled 2021-02-12: qty 30, 30d supply, fill #1
  Filled 2021-03-13: qty 30, 30d supply, fill #2
  Filled 2021-04-09: qty 30, 30d supply, fill #3
  Filled 2021-05-15: qty 30, 30d supply, fill #4
  Filled 2021-06-19: qty 30, 30d supply, fill #5

## 2021-01-01 NOTE — Telephone Encounter (Signed)
Rodena Piety please follow-up with [name] and offer appointment.

## 2021-01-01 NOTE — Telephone Encounter (Signed)
LMOM for [name] to return call.

## 2021-01-01 NOTE — Telephone Encounter (Signed)
This is a lot of information to go over but important to discuss. I think it would be best if we all discussed this together. This could take place over a virtual visit (his mother would need to also be there).

## 2021-01-02 ENCOUNTER — Telehealth: Payer: Self-pay

## 2021-01-02 ENCOUNTER — Other Ambulatory Visit (HOSPITAL_COMMUNITY): Payer: Self-pay

## 2021-01-02 ENCOUNTER — Ambulatory Visit (INDEPENDENT_AMBULATORY_CARE_PROVIDER_SITE_OTHER): Payer: Medicare Other

## 2021-01-02 ENCOUNTER — Other Ambulatory Visit: Payer: Self-pay

## 2021-01-02 VITALS — BP 156/98

## 2021-01-02 DIAGNOSIS — Z23 Encounter for immunization: Secondary | ICD-10-CM

## 2021-01-02 NOTE — Telephone Encounter (Signed)
Disussed with [name].  Son says patient is still sleeping 12-13 hours a day.  He just wants to makes sure 4 hour between BP meds isn't too close together?  To Joelene Millin.

## 2021-01-02 NOTE — Telephone Encounter (Signed)
Agree if continues to be elevated over  140/90 to call office and make follow up appt for blood pressure

## 2021-01-02 NOTE — Telephone Encounter (Signed)
Patient came in today with son for flu shot. Patient's son requested patient's blood pressure be checked: 156/98. Discussed with the patient goal and advised to monitor at home. To Joelene Millin, NP

## 2021-01-05 NOTE — Telephone Encounter (Signed)
Which medications is he referring too?

## 2021-01-05 NOTE — Telephone Encounter (Signed)
LMOM for [name] to return call.

## 2021-01-07 ENCOUNTER — Other Ambulatory Visit (HOSPITAL_COMMUNITY): Payer: Self-pay

## 2021-01-08 NOTE — Telephone Encounter (Signed)
LMOM to return call.

## 2021-02-02 ENCOUNTER — Other Ambulatory Visit (HOSPITAL_COMMUNITY): Payer: Self-pay

## 2021-02-02 ENCOUNTER — Other Ambulatory Visit: Payer: Self-pay | Admitting: Nurse Practitioner

## 2021-02-03 ENCOUNTER — Other Ambulatory Visit (HOSPITAL_COMMUNITY): Payer: Self-pay

## 2021-02-03 MED ORDER — ATORVASTATIN CALCIUM 40 MG PO TABS
40.0000 mg | ORAL_TABLET | Freq: Every day | ORAL | 1 refills | Status: DC
  Filled 2021-02-03: qty 90, 90d supply, fill #0
  Filled 2021-05-22: qty 90, 90d supply, fill #1

## 2021-02-04 ENCOUNTER — Ambulatory Visit: Payer: Medicare Other | Admitting: Nurse Practitioner

## 2021-02-06 ENCOUNTER — Ambulatory Visit: Payer: Medicare Other | Admitting: Nurse Practitioner

## 2021-02-12 ENCOUNTER — Other Ambulatory Visit (HOSPITAL_COMMUNITY): Payer: Self-pay

## 2021-02-13 ENCOUNTER — Other Ambulatory Visit (HOSPITAL_COMMUNITY): Payer: Self-pay

## 2021-03-05 ENCOUNTER — Other Ambulatory Visit (HOSPITAL_COMMUNITY): Payer: Self-pay

## 2021-03-13 ENCOUNTER — Other Ambulatory Visit (HOSPITAL_COMMUNITY): Payer: Self-pay

## 2021-04-06 ENCOUNTER — Other Ambulatory Visit (HOSPITAL_COMMUNITY): Payer: Self-pay

## 2021-04-09 ENCOUNTER — Other Ambulatory Visit (HOSPITAL_COMMUNITY): Payer: Self-pay

## 2021-04-09 ENCOUNTER — Other Ambulatory Visit: Payer: Self-pay | Admitting: Cardiovascular Disease

## 2021-04-10 ENCOUNTER — Other Ambulatory Visit (HOSPITAL_COMMUNITY): Payer: Self-pay

## 2021-04-10 ENCOUNTER — Other Ambulatory Visit: Payer: Self-pay | Admitting: Nurse Practitioner

## 2021-04-10 DIAGNOSIS — I10 Essential (primary) hypertension: Secondary | ICD-10-CM

## 2021-04-10 MED ORDER — VALSARTAN 80 MG PO TABS
80.0000 mg | ORAL_TABLET | Freq: Every evening | ORAL | 0 refills | Status: DC
  Filled 2021-04-10 – 2021-05-22 (×2): qty 30, 30d supply, fill #0

## 2021-04-10 MED ORDER — VALSARTAN 160 MG PO TABS
160.0000 mg | ORAL_TABLET | Freq: Every morning | ORAL | 0 refills | Status: DC
  Filled 2021-04-10: qty 30, 30d supply, fill #0

## 2021-04-10 MED ORDER — METOPROLOL TARTRATE 25 MG PO TABS
25.0000 mg | ORAL_TABLET | Freq: Two times a day (BID) | ORAL | 0 refills | Status: DC
  Filled 2021-04-10: qty 120, 60d supply, fill #0

## 2021-04-10 NOTE — Telephone Encounter (Signed)
Pharmacy requested refill Pended Rx's and sent to Jessica for approval due to HIGH ALERT Warning.  

## 2021-04-14 DIAGNOSIS — Z23 Encounter for immunization: Secondary | ICD-10-CM | POA: Diagnosis not present

## 2021-05-07 ENCOUNTER — Other Ambulatory Visit (HOSPITAL_COMMUNITY): Payer: Self-pay

## 2021-05-15 ENCOUNTER — Other Ambulatory Visit (HOSPITAL_COMMUNITY): Payer: Self-pay

## 2021-05-22 ENCOUNTER — Other Ambulatory Visit: Payer: Self-pay | Admitting: Nurse Practitioner

## 2021-05-22 ENCOUNTER — Other Ambulatory Visit (HOSPITAL_COMMUNITY): Payer: Self-pay

## 2021-05-22 DIAGNOSIS — Z23 Encounter for immunization: Secondary | ICD-10-CM | POA: Diagnosis not present

## 2021-05-22 DIAGNOSIS — I10 Essential (primary) hypertension: Secondary | ICD-10-CM

## 2021-05-22 MED ORDER — VALSARTAN 160 MG PO TABS
160.0000 mg | ORAL_TABLET | Freq: Every morning | ORAL | 0 refills | Status: DC
  Filled 2021-05-22: qty 30, 30d supply, fill #0

## 2021-06-04 DIAGNOSIS — Z20822 Contact with and (suspected) exposure to covid-19: Secondary | ICD-10-CM | POA: Diagnosis not present

## 2021-06-08 ENCOUNTER — Other Ambulatory Visit (HOSPITAL_COMMUNITY): Payer: Self-pay

## 2021-06-08 ENCOUNTER — Other Ambulatory Visit: Payer: Self-pay | Admitting: Cardiovascular Disease

## 2021-06-09 ENCOUNTER — Other Ambulatory Visit (HOSPITAL_COMMUNITY): Payer: Self-pay

## 2021-06-09 MED ORDER — METOPROLOL TARTRATE 25 MG PO TABS
25.0000 mg | ORAL_TABLET | Freq: Two times a day (BID) | ORAL | 0 refills | Status: DC
  Filled 2021-06-09: qty 120, 60d supply, fill #0

## 2021-06-15 DIAGNOSIS — Z20822 Contact with and (suspected) exposure to covid-19: Secondary | ICD-10-CM | POA: Diagnosis not present

## 2021-06-18 DIAGNOSIS — Z23 Encounter for immunization: Secondary | ICD-10-CM | POA: Diagnosis not present

## 2021-06-19 ENCOUNTER — Other Ambulatory Visit (HOSPITAL_COMMUNITY): Payer: Self-pay

## 2021-06-19 ENCOUNTER — Other Ambulatory Visit: Payer: Self-pay | Admitting: Nurse Practitioner

## 2021-06-19 DIAGNOSIS — I10 Essential (primary) hypertension: Secondary | ICD-10-CM

## 2021-06-22 ENCOUNTER — Other Ambulatory Visit: Payer: Self-pay | Admitting: Cardiovascular Disease

## 2021-06-22 ENCOUNTER — Other Ambulatory Visit (HOSPITAL_COMMUNITY): Payer: Self-pay

## 2021-06-22 DIAGNOSIS — I10 Essential (primary) hypertension: Secondary | ICD-10-CM

## 2021-06-22 MED ORDER — VALSARTAN 160 MG PO TABS
160.0000 mg | ORAL_TABLET | Freq: Every morning | ORAL | 0 refills | Status: DC
  Filled 2021-06-22: qty 30, 30d supply, fill #0

## 2021-06-22 MED ORDER — VALSARTAN 80 MG PO TABS
80.0000 mg | ORAL_TABLET | Freq: Every evening | ORAL | 0 refills | Status: DC
  Filled 2021-06-22: qty 30, 30d supply, fill #0

## 2021-06-26 ENCOUNTER — Other Ambulatory Visit (HOSPITAL_COMMUNITY): Payer: Self-pay

## 2021-06-26 ENCOUNTER — Ambulatory Visit (INDEPENDENT_AMBULATORY_CARE_PROVIDER_SITE_OTHER): Payer: Medicare Other | Admitting: Family

## 2021-06-26 VITALS — BP 148/88 | HR 87 | Temp 98.2°F | Resp 18 | Ht 64.0 in | Wt 151.4 lb

## 2021-06-26 DIAGNOSIS — Z8673 Personal history of transient ischemic attack (TIA), and cerebral infarction without residual deficits: Secondary | ICD-10-CM

## 2021-06-26 DIAGNOSIS — I10 Essential (primary) hypertension: Secondary | ICD-10-CM

## 2021-06-26 DIAGNOSIS — N3281 Overactive bladder: Secondary | ICD-10-CM

## 2021-06-26 DIAGNOSIS — K219 Gastro-esophageal reflux disease without esophagitis: Secondary | ICD-10-CM

## 2021-06-26 DIAGNOSIS — N183 Chronic kidney disease, stage 3 unspecified: Secondary | ICD-10-CM

## 2021-06-26 DIAGNOSIS — I480 Paroxysmal atrial fibrillation: Secondary | ICD-10-CM

## 2021-06-26 MED ORDER — VALSARTAN 80 MG PO TABS
80.0000 mg | ORAL_TABLET | Freq: Every evening | ORAL | 1 refills | Status: DC
  Filled 2021-06-26 – 2021-07-30 (×2): qty 90, 90d supply, fill #0
  Filled 2021-10-29: qty 85, 85d supply, fill #1
  Filled 2021-10-29: qty 5, 5d supply, fill #1
  Filled 2021-11-16: qty 85, 85d supply, fill #2

## 2021-06-26 MED ORDER — VALSARTAN 160 MG PO TABS
160.0000 mg | ORAL_TABLET | Freq: Every morning | ORAL | 1 refills | Status: DC
  Filled 2021-06-26 – 2021-07-30 (×2): qty 90, 90d supply, fill #0
  Filled 2021-11-16: qty 90, 90d supply, fill #1

## 2021-06-26 MED ORDER — MIRABEGRON ER 50 MG PO TB24
50.0000 mg | ORAL_TABLET | Freq: Every day | ORAL | 1 refills | Status: DC
  Filled 2021-06-26: qty 90, 90d supply, fill #0
  Filled 2021-07-30: qty 30, 30d supply, fill #0
  Filled 2021-09-11: qty 30, 30d supply, fill #1
  Filled 2021-10-16: qty 30, 30d supply, fill #2
  Filled 2021-11-16: qty 30, 30d supply, fill #3
  Filled 2021-12-29: qty 30, 30d supply, fill #4
  Filled 2022-03-04: qty 30, 30d supply, fill #5

## 2021-06-26 MED ORDER — ATORVASTATIN CALCIUM 40 MG PO TABS
40.0000 mg | ORAL_TABLET | Freq: Every day | ORAL | 1 refills | Status: DC
  Filled 2021-06-26 – 2021-09-11 (×2): qty 90, 90d supply, fill #0
  Filled 2022-01-15: qty 90, 90d supply, fill #1

## 2021-06-26 NOTE — Progress Notes (Signed)
? ?Provider: Marlowe Sax FNP-C  ? ?Lauree Chandler, NP ? ?Patient Care Team: ?Lauree Chandler, NP as PCP - General (Geriatric Medicine) ?Druscilla Brownie, MD as Consulting Physician (Dermatology) ?[name] Irani, MD (Inactive) as Consulting Physician (Gastroenterology) ?Neldon Mc, MD as Consulting Physician (General Surgery) ?Lorretta Harp, MD as Consulting Physician (Cardiology) ?Bjorn Loser, MD as Consulting Physician (Urology) ?Pieter Partridge, DO as Consulting Physician (Neurology) ? ?Extended Emergency Contact Information ?Primary Emergency Contact: Friddle,Kimi ? [place] ?Home Phone: [phone] ?Mobile Phone: [phone] ?Relation: Daughter ?Secondary Emergency Contact: Laurine Blazer ?Mobile Phone: [phone] ?Relation: Son ? ?Code Status:  Full Code  ?Goals of care: Advanced Directive information ? ?  11/28/2020  ? 11:22 AM  ?Advanced Directives  ?Does Patient Have a Medical Advance Directive? No  ?Does patient want to make changes to medical advance directive? Yes (MAU/Ambulatory/Procedural Areas - Information given)  ? ? ? ?Chief Complaint  ?Patient presents with  ? Medical Management of Chronic Issues  ?  Medical Management of Chronic Issues. Requesting refill on blood pressure medications.   ? ? ?HPI:  ?Pt is a 88 y.o. female seen today for medical management of chronic diseases.  Has some medical history essential hypertension,paroxysmal arterial fibrillation, peripheral vascular disease, history of CVA, COPD, GAD, chronic kidney disease stage III, hyperlipidemia, heart murmur,, degenerative disc disease, Memory loss among others.  She is here with her son.  Has not been seen for several months usually follows up with Sherrie Mustache, NP. ?She denies any acute issues today. ?She requested refills on her blood pressure medications but will still need to be seen prior to refill of medication. ? ? ?Past Medical History:  ?Diagnosis Date  ? Aortic stenosis  07/20/2012  ? Aortic stenosis   ? Aortic valve disorder 08/13/2011  ? ECHO - EF >50%; mild diastolic dysfunction; calcified aortic valve, not well visualized; mod/severe aortic stenosis w/ worsening gradients when compared to 2012  ? Arthritis   ? Cancer [facility])   ? Breast  ? Carotid bruit 08/06/2008  ? Doppler - R ECA demonstrates noarrowing w/ elevated velocities consistent w/ >70% diameter reduction; R and L ICAs show no evidence of diameter reduction, significant tortuosity or vascular abnormality;   ? Change in voice   ? COPD (chronic obstructive pulmonary disease) ([place])   ? Hearing loss   ? Heart murmur   ? Hyperlipidemia   ? Hypertension   ? dr Gwenlyn Found  ? Osteoporosis   ? PAF (paroxysmal atrial fibrillation) ([place]) 09/04/2012  ? PVD (peripheral vascular disease) ([place]) 08/13/2010  ? R/P MV - normal pattern of perfusion in all regions, EF 76%; no significant wall abnormalities noted; normal perfusion study  ? Right carotid bruit   ? high-grade right external carotid artery stenosis by 2 parts ultrasound 2 years ago  ? S/P aortic valve replacement with bioprosthetic valve 08/30/2012  ? 21 mm [facility] Ease bovine pericardial tissue valve  ? Stroke (cerebrum) ([place])   ? ?Past Surgical History:  ?Procedure Laterality Date  ? ABDOMINAL HYSTERECTOMY    ? Partial  ? AORTIC VALVE REPLACEMENT N/A 08/30/2012  ? Procedure: AORTIC VALVE REPLACEMENT (AVR);  Surgeon: Rexene Alberts, MD;  Location: [place];  Service: Open Heart Surgery;  Laterality: N/A;  ? BIOPSY SHOULDER Left 10/08/2005  ? shave biopsy  ? BREAST LUMPECTOMY  02/25/1997  ? right  ? CARDIAC CATHETERIZATION    ? [place]  ? COSMETIC SURGERY Left 1997  ? Breast  implant  ? EYE SURGERY  1997  ? Cataract surgery  ? INTRAOPERATIVE TRANSESOPHAGEAL ECHOCARDIOGRAM N/A 08/30/2012  ? Procedure: INTRAOPERATIVE TRANSESOPHAGEAL ECHOCARDIOGRAM;  Surgeon: Rexene Alberts, MD;  Location: [place];  Service: Open Heart Surgery;  Laterality: N/A;  ? LEFT AND RIGHT HEART CATHETERIZATION  WITH CORONARY ANGIOGRAM N/A 08/14/2012  ? Procedure: LEFT AND RIGHT HEART CATHETERIZATION WITH CORONARY ANGIOGRAM;  Surgeon: Lorretta Harp, MD;  [facility] CATH LAB;  Service: Cardiovascular;  Laterality: N/A;  ? LEFT HEART CATH  08/14/12  ? Nl cors, AS  ? MASTECTOMY  09/29/95  ? left  ? PARTIAL HYSTERECTOMY    ? TOTAL HIP ARTHROPLASTY  09/2010  ? ? ?Allergies  ?Allergen Reactions  ? Lisinopril Cough  ? Ceftriaxone Rash  ? Codeine Rash  ?  All over the body.  ? ? ?Allergies as of 06/26/2021   ? ?   Reactions  ? Lisinopril Cough  ? Ceftriaxone Rash  ? Codeine Rash  ? All over the body.  ? ?  ? ?  ?Medication List  ?  ? ?  ? Accurate as of June 26, 2021 11:59 PM. If you have any questions, ask your nurse or doctor.  ?  ?  ? ?  ? ?acetaminophen 650 MG CR tablet ?Commonly known as: TYLENOL ?Take 650 mg by mouth every morning. ?  ?acetaminophen 325 MG tablet ?Commonly known as: TYLENOL ?Take 650 mg by mouth at bedtime as needed (pain). ?  ?ADVANCED COLLAGEN PO ?Take 1,000 mg by mouth daily. ?  ?albuterol 108 (90 Base) MCG/ACT inhaler ?Commonly known as: ProAir HFA ?INHALE 1 PUFF INTO THE LUNGS EVERY 6 (SIX) HOURS AS NEEDED FOR WHEEZING OR SHORTNESS OF BREATH. ?  ?amoxicillin 500 MG tablet ?Commonly known as: AMOXIL ?Take 4 by mouth 1 hour prior to dental procedure ?  ?atorvastatin 40 MG tablet ?Commonly known as: LIPITOR ?Take 1 tablet (40 mg total) by mouth daily. ?  ?CITRACAL + D PO ?Take 1 tablet by mouth 2 (two) times daily. ?  ?dextromethorphan-guaiFENesin 30-600 MG 12hr tablet ?Commonly known as: [place] DM ?Take 1 tablet by mouth 2 (two) times daily as needed. ?  ?Eliquis 5 MG Tabs tablet ?Generic drug: apixaban ?Take 1 tablet (5 mg total) by mouth 2 (two) times daily. ?  ?EQ CHLORTABS PO ?Take 1 tablet by mouth every 4 (four) hours as needed. ?  ?famotidine 20 MG tablet ?Commonly known as: Pepcid ?One after supper ?  ?FISH OIL PO ?Take 1 capsule by mouth daily. ?  ?fluticasone 50 MCG/ACT nasal spray ?Commonly  known as: FLONASE ?PLACE 1 SPRAY INTO BOTH NOSTRILS TWICE DAILY AS NEEDED FOR ALLERGIES OR RHINITIS. ?  ?hydrOXYzine 25 MG tablet ?Commonly known as: ATARAX ?Take one tablet by mouth daily as needed for itching. ?  ?metoprolol tartrate 25 MG tablet ?Commonly known as: LOPRESSOR ?Take 1 tablet (25 mg total) by mouth 2 (two) times daily. Schedule an appointment with cardiology for further refills, 1st attempt ?  ?mirabegron ER 50 MG Tb24 tablet ?Commonly known as: Myrbetriq ?Take 1 tablet (50 mg total) by mouth daily. ?  ?omeprazole 20 MG capsule ?Commonly known as: PRILOSEC ?Take 1 capsule (20 mg total) by mouth daily. ?  ?PHILLIPS MILK OF MAGNESIA PO ?Take 15 mL by mouth. Take daily at 6 o'clock at night ?  ?shark liver oil-cocoa butter 0.25-3-85.5 % suppository ?Commonly known as: PREPARATION H ?Place 1 suppository rectally as needed for hemorrhoids. ?  ?valsartan 160 MG tablet ?  Commonly known as: DIOVAN ?Take 1 tablet (160 mg total) by mouth in the morning. ?What changed: additional instructions ?Changed by: Sandrea Hughs, NP ?  ?valsartan 80 MG tablet ?Commonly known as: DIOVAN ?Take 1 tablet (80 mg total) by mouth every evening. ?What changed: additional instructions ?Changed by: Sandrea Hughs, NP ?  ?vitamin C 100 MG tablet ?Take 100 mg by mouth daily. ?  ?vitamin E 180 MG (400 UNITS) capsule ?Take 400 Units by mouth daily. ?  ? ?  ? ? ?Review of Systems  ?Constitutional:  Negative for appetite change, chills, fatigue, fever and unexpected weight change.  ?HENT:  Negative for congestion, dental problem, ear discharge, ear pain, facial swelling, hearing loss, nosebleeds, postnasal drip, rhinorrhea, sinus pressure, sinus pain, sneezing, sore throat, tinnitus and trouble swallowing.   ?Eyes:  Negative for pain, discharge, redness, itching and visual disturbance.  ?Respiratory:  Negative for cough, chest tightness, shortness of breath and wheezing.   ?Cardiovascular:  Negative for chest pain, palpitations and  leg swelling.  ?Gastrointestinal:  Negative for abdominal distention, abdominal pain, blood in stool, constipation, diarrhea, nausea and vomiting.  ?Endocrine: Negative for cold intolerance, heat intolerance, po

## 2021-07-03 ENCOUNTER — Other Ambulatory Visit: Payer: Medicare Other

## 2021-07-03 DIAGNOSIS — I10 Essential (primary) hypertension: Secondary | ICD-10-CM | POA: Diagnosis not present

## 2021-07-03 DIAGNOSIS — N183 Chronic kidney disease, stage 3 unspecified: Secondary | ICD-10-CM | POA: Diagnosis not present

## 2021-07-03 DIAGNOSIS — I480 Paroxysmal atrial fibrillation: Secondary | ICD-10-CM

## 2021-07-04 LAB — COMPLETE METABOLIC PANEL WITH GFR
AG Ratio: 2.2 (calc) (ref 1.0–2.5)
ALT: 10 U/L (ref 6–29)
AST: 22 U/L (ref 10–35)
Albumin: 4.3 g/dL (ref 3.6–5.1)
Alkaline phosphatase (APISO): 68 U/L (ref 37–153)
BUN/Creatinine Ratio: 13 (calc) (ref 6–22)
BUN: 14 mg/dL (ref 7–25)
CO2: 24 mmol/L (ref 20–32)
Calcium: 10.1 mg/dL (ref 8.6–10.4)
Chloride: 104 mmol/L (ref 98–110)
Creat: 1.12 mg/dL — ABNORMAL HIGH (ref 0.60–0.95)
Globulin: 2 g/dL (calc) (ref 1.9–3.7)
Glucose, Bld: 87 mg/dL (ref 65–99)
Potassium: 4 mmol/L (ref 3.5–5.3)
Sodium: 141 mmol/L (ref 135–146)
Total Bilirubin: 1.4 mg/dL — ABNORMAL HIGH (ref 0.2–1.2)
Total Protein: 6.3 g/dL (ref 6.1–8.1)
eGFR: 47 mL/min/{1.73_m2} — ABNORMAL LOW (ref >=60)

## 2021-07-04 LAB — LIPID PANEL
Cholesterol: 140 mg/dL (ref <200)
HDL: 60 mg/dL (ref >=50)
LDL Cholesterol (Calc): 65 mg/dL (calc)
Non-HDL Cholesterol (Calc): 80 mg/dL (calc) (ref <130)
Total CHOL/HDL Ratio: 2.3 (calc) (ref <5.0)
Triglycerides: 72 mg/dL (ref <150)

## 2021-07-04 LAB — CBC WITH DIFFERENTIAL/PLATELET
Absolute Monocytes: 578 cells/uL (ref 200–950)
Basophils Absolute: 60 cells/uL (ref 0–200)
Basophils Relative: 0.8 %
Eosinophils Absolute: 90 cells/uL (ref 15–500)
Eosinophils Relative: 1.2 %
HCT: 38.4 % (ref 35.0–45.0)
Hemoglobin: 13.2 g/dL (ref 11.7–15.5)
Lymphs Abs: 1800 cells/uL (ref 850–3900)
MCH: 33.6 pg — ABNORMAL HIGH (ref 27.0–33.0)
MCHC: 34.4 g/dL (ref 32.0–36.0)
MCV: 97.7 fL (ref 80.0–100.0)
MPV: 10.1 fL (ref 7.5–12.5)
Monocytes Relative: 7.7 %
Neutro Abs: 4973 cells/uL (ref 1500–7800)
Neutrophils Relative %: 66.3 %
Platelets: 170 10*3/uL (ref 140–400)
RBC: 3.93 10*6/uL (ref 3.80–5.10)
RDW: 12.6 % (ref 11.0–15.0)
Total Lymphocyte: 24 %
WBC: 7.5 10*3/uL (ref 3.8–10.8)

## 2021-07-04 LAB — TSH: TSH: 0.85 mIU/L (ref 0.40–4.50)

## 2021-07-06 DIAGNOSIS — Z20822 Contact with and (suspected) exposure to covid-19: Secondary | ICD-10-CM | POA: Diagnosis not present

## 2021-07-09 DIAGNOSIS — Z23 Encounter for immunization: Secondary | ICD-10-CM | POA: Diagnosis not present

## 2021-07-10 ENCOUNTER — Other Ambulatory Visit (HOSPITAL_BASED_OUTPATIENT_CLINIC_OR_DEPARTMENT_OTHER): Payer: Self-pay

## 2021-07-15 ENCOUNTER — Other Ambulatory Visit: Payer: Self-pay

## 2021-07-15 ENCOUNTER — Encounter: Payer: Self-pay | Admitting: Cardiovascular Disease

## 2021-07-15 ENCOUNTER — Other Ambulatory Visit (HOSPITAL_COMMUNITY): Payer: Self-pay

## 2021-07-15 ENCOUNTER — Ambulatory Visit (INDEPENDENT_AMBULATORY_CARE_PROVIDER_SITE_OTHER): Payer: Medicare Other | Admitting: Cardiovascular Disease

## 2021-07-15 VITALS — BP 138/78 | HR 52 | Ht 63.0 in | Wt 154.4 lb

## 2021-07-15 DIAGNOSIS — Z953 Presence of xenogenic heart valve: Secondary | ICD-10-CM

## 2021-07-15 DIAGNOSIS — R0989 Other specified symptoms and signs involving the circulatory and respiratory systems: Secondary | ICD-10-CM

## 2021-07-15 DIAGNOSIS — I4811 Longstanding persistent atrial fibrillation: Secondary | ICD-10-CM

## 2021-07-15 DIAGNOSIS — I10 Essential (primary) hypertension: Secondary | ICD-10-CM

## 2021-07-15 MED ORDER — APIXABAN 5 MG PO TABS
5.0000 mg | ORAL_TABLET | Freq: Two times a day (BID) | ORAL | 1 refills | Status: DC
  Filled 2021-07-15: qty 60, 30d supply, fill #0
  Filled 2021-08-17: qty 60, 30d supply, fill #1
  Filled 2021-09-22: qty 60, 30d supply, fill #2
  Filled 2021-10-29: qty 60, 30d supply, fill #3
  Filled 2021-12-09: qty 60, 30d supply, fill #4
  Filled 2022-01-15: qty 60, 30d supply, fill #5

## 2021-07-15 NOTE — Assessment & Plan Note (Signed)
History of essential hypertension blood pressure measured today at 138/78.  She is on metoprolol and valsartan. ?

## 2021-07-15 NOTE — Patient Instructions (Signed)
Medication Instructions:  Your physician recommends that you continue on your current medications as directed. Please refer to the Current Medication list given to you today.  *If you need a refill on your cardiac medications before your next appointment, please call your pharmacy*   Testing/Procedures: Your physician has requested that you have an echocardiogram. Echocardiography is a painless test that uses sound waves to create images of your heart. It provides your doctor with information about the size and shape of your heart and how well your heart's chambers and valves are working. This procedure takes approximately one hour. There are no restrictions for this procedure. This procedure will be done at [address]   Your physician has requested that you have a carotid duplex. This test is an ultrasound of the carotid arteries in your neck. It looks at blood flow through these arteries that supply the brain with blood. Allow one hour for this exam. There are no restrictions or special instructions.This procedure will be done at [address]     Follow-Up: At [facility], you and your health needs are our priority.  As part of our continuing mission to provide you with exceptional heart care, we have created designated Provider Care Teams.  These Care Teams include your primary Cardiologist (physician) and Advanced Practice Providers (APPs -  Physician Assistants and Nurse Practitioners) who all work together to provide you with the care you need, when you need it.  We recommend signing up for the patient portal called "MyChart".  Sign up information is provided on this After Visit Summary.  MyChart is used to connect with patients for Virtual Visits (Telemedicine).  Patients are able to view lab/test results, encounter notes, upcoming appointments, etc.  Non-urgent messages can be sent to your provider as well.   To learn more about what you can do with  MyChart, go to [email or web address].    Your next appointment:   12 month(s)  The format for your next appointment:   In Person  Provider:   Jonathan Berry, MD 

## 2021-07-15 NOTE — Progress Notes (Signed)
? ? ? ?07/15/2021 ?[name]   ?[birth year]  ?[id number] ? ?Primary Physician Bonnie Chandler, NP ?Primary Cardiologist: Bonnie Harp MD [name], [place], [place] ? ?HPI:  [name] is a 88 y.o.    mildly overweight widowed Caucasian female who I last saw    2019/11/15.  Her husband died back in May 29, 2012.  She currently lives alone.  She is accompanied by her son [name]..  She is a mother of 2, grandmother to 1 grandchild.   Her risk factors include hypertension and family history. A brother died at age 83 from heart-related issues while he was being operated on. She has never had a heart attack or stroke. She does have moderate aortic stenosis by 2D echocardiogram last performed a year ago with a valve area of 0.83 cm2, peak gradient of 57, and mean of 35. She had negative Myoview on August 13, 2010. Since I saw her last, she developed exertional jaw pain, which was fairly reproducible. Her last lipid profile a year ago was excellent with total cholesterol 166, LDL 89, HDL 44. 2-D echo was performed showed critical aortic stenosis the valve area 0.5 cm?. Based on thisthe patient underwent right left heart cardiac catheterization by myself revealing normal coronaries and normal left function. She had critical aortic stenosis and ultimately underwent bioprosthetic aortic valve replacement by Dr. Roxy Carpenter on 08/30/12 with an Edwards magna ease pericardial tissue valve (21 mm) excellent result. Her postop course was complicated by nausea and paroxysmal atrial fibrillation for which she was treated with low dose amiodarone and Coumadin anticoagulation.  These drugs were ultimately discontinued. ?  ?Since I saw her in the office a year and a half ago she continues to do well.  She denies chest pain or shortness of breath.  She has not had to use bronchodilators.  Her most recent 2D echo performed 06/24/2020 revealed normal LV systolic function with a well-functioning aortic bioprosthesis. ? ? ?Current Meds  ?Medication Sig   ? acetaminophen (TYLENOL) 325 MG tablet Take 650 mg by mouth at bedtime as needed (pain).  ? acetaminophen (TYLENOL) 650 MG CR tablet Take 650 mg by mouth every morning.  ? albuterol (PROAIR HFA) 108 (90 Base) MCG/ACT inhaler INHALE 1 PUFF INTO THE LUNGS EVERY 6 (SIX) HOURS AS NEEDED FOR WHEEZING OR SHORTNESS OF BREATH.  ? amoxicillin (AMOXIL) 500 MG tablet Take 4 by mouth 1 hour prior to dental procedure  ? apixaban (ELIQUIS) 5 MG TABS tablet Take 1 tablet (5 mg total) by mouth 2 (two) times daily.  ? Ascorbic Acid (VITAMIN C) 100 MG tablet Take 100 mg by mouth daily.  ? atorvastatin (LIPITOR) 40 MG tablet Take 1 tablet (40 mg total) by mouth daily.  ? Calcium Citrate-Vitamin D (CITRACAL + D PO) Take 1 tablet by mouth 2 (two) times daily.   ? Chlorpheniramine Maleate (EQ CHLORTABS PO) Take 1 tablet by mouth every 4 (four) hours as needed.  ? dextromethorphan-guaiFENesin (MUCINEX DM) 30-600 MG 12hr tablet Take 1 tablet by mouth 2 (two) times daily as needed.   ? famotidine (PEPCID) 20 MG tablet One after supper  ? fluticasone (FLONASE) 50 MCG/ACT nasal spray PLACE 1 SPRAY INTO BOTH NOSTRILS TWICE DAILY AS NEEDED FOR ALLERGIES OR RHINITIS.  ? hydrOXYzine (ATARAX/VISTARIL) 25 MG tablet Take one tablet by mouth daily as needed for itching.  ? Magnesium Hydroxide (PHILLIPS MILK OF MAGNESIA PO) Take 15 mL by mouth. Take daily at 6 o'clock at night  ? metoprolol  tartrate (LOPRESSOR) 25 MG tablet Take 1 tablet (25 mg total) by mouth 2 (two) times daily. Schedule an appointment with cardiology for further refills, 1st attempt  ? mirabegron ER (MYRBETRIQ) 50 MG TB24 tablet Take 1 tablet (50 mg total) by mouth daily.  ? Omega-3 Fatty Acids (FISH OIL PO) Take 1 capsule by mouth daily.  ? Specialty Vitamins Products (ADVANCED COLLAGEN PO) Take 1,000 mg by mouth daily.  ? valsartan (DIOVAN) 160 MG tablet Take 1 tablet (160 mg total) by mouth in the morning.  ? valsartan (DIOVAN) 80 MG tablet Take 1 tablet (80 mg total) by mouth  every evening.  ? vitamin E 400 UNIT capsule Take 400 Units by mouth daily.  ?  ? ?Allergies  ?Allergen Reactions  ? Lisinopril Cough  ? Ceftriaxone Rash  ? Codeine Rash  ?  All over the body.  ? ? ?Social History  ? ?Socioeconomic History  ? Marital status: Widowed  ?  Spouse name: Not on file  ? Number of children: 2  ? Years of education: 108  ? Highest education level: Not on file  ?Occupational History  ? Occupation: retired  ?Tobacco Use  ? Smoking status: Never  ? Smokeless tobacco: Never  ?Vaping Use  ? Vaping Use: Never used  ?Substance and Sexual Activity  ? Alcohol use: No  ? Drug use: No  ? Sexual activity: Never  ?Other Topics Concern  ? Not on file  ?Social History Narrative  ? Right handed  ? One story home  ? ?Social Determinants of Health  ? ?Financial Resource Strain: Not on file  ?Food Insecurity: Not on file  ?Transportation Needs: Not on file  ?Physical Activity: Not on file  ?Stress: Not on file  ?Social Connections: Not on file  ?Intimate Partner Violence: Not on file  ?  ? ?Review of Systems: ?General: negative for chills, fever, night sweats or weight changes.  ?Cardiovascular: negative for chest pain, dyspnea on exertion, edema, orthopnea, palpitations, paroxysmal nocturnal dyspnea or shortness of breath ?Dermatological: negative for rash ?Respiratory: negative for cough or wheezing ?Urologic: negative for hematuria ?Abdominal: negative for nausea, vomiting, diarrhea, bright red blood per rectum, melena, or hematemesis ?Neurologic: negative for visual changes, syncope, or dizziness ?All other systems reviewed and are otherwise negative except as noted above. ? ? ? ?Blood pressure 138/78, pulse (!) 52, height '5\' 3"'$  (1.6 m), weight 154 lb 6.4 oz (70 kg), SpO2 94 %.  ?General appearance: alert and no distress ?Neck: no adenopathy, no JVD, supple, symmetrical, trachea midline, thyroid not enlarged, symmetric, no tenderness/mass/nodules, and soft right carotid bruit ?Lungs: clear to auscultation  bilaterally ?Heart: 2/6 outflow tract murmur consistent with aortic stenosis ?Extremities: extremities normal, atraumatic, no cyanosis or edema ?Pulses: 2+ and symmetric ?Skin: Skin color, texture, turgor normal. No rashes or lesions ?Neurologic: Grossly normal ? ?EKG atrial fibrillation with ventricular sponsor 52, left axis deviation with septal Q waves.  I personally reviewed this EKG. ? ?ASSESSMENT AND PLAN:  ? ?Essential hypertension ?History of essential hypertension blood pressure measured today at 138/78.  She is on metoprolol and valsartan. ? ?S/P aortic valve replacement with bioprosthetic valve ?History of critical aortic stenosis status post bioprosthetic AVR by Dr. Roxy Carpenter 08/30/2012 after I did a heart catheterization revealing normal coronary arteries.  She has done well afterwards.  She denies chest pain or shortness of breath.  2D echo performed 06/24/2020 revealing normal LV systolic function with a well-functioning aortic bioprosthesis (21 mm Edwards pericardial tissue valve).  This will be repeated on an annual basis. ? ?Atrial fibrillation ([place]) ?History of chronic A-fib rate controlled on Eliquis oral anticoagulation. ? ? ? ? ?Bonnie Harp MD FACP,FACC,FAHA, FSCAI ?07/15/2021 ?3:53 PM ?

## 2021-07-15 NOTE — Assessment & Plan Note (Signed)
History of critical aortic stenosis status post bioprosthetic AVR by Dr. Roxy Manns 08/30/2012 after I did a heart catheterization revealing normal coronary arteries.  She has done well afterwards.  She denies chest pain or shortness of breath.  2D echo performed 06/24/2020 revealing normal LV systolic function with a well-functioning aortic bioprosthesis (21 mm Edwards pericardial tissue valve).  This will be repeated on an annual basis. ?

## 2021-07-15 NOTE — Telephone Encounter (Signed)
Prescription refill request for Eliquis received. ?Indication:afib ?Last office visit:berry 07/15/21 ?Scr:1.12 07/03/21 ?Age: 33f?Weight:70kg ? ?

## 2021-07-15 NOTE — Assessment & Plan Note (Signed)
History of chronic A. fib rate controlled on Eliquis oral anticoagulation 

## 2021-07-17 ENCOUNTER — Telehealth: Payer: Self-pay | Admitting: Cardiovascular Disease

## 2021-07-17 NOTE — Telephone Encounter (Signed)
Patient was confused on her medications. She was discussing with me about her vitamin e, she was explaining that she was taking 180 in the morning and 80 evening. But I reviewed medication list and her valsartan is 160 mg in the morning, and 80 mg in the evening. I did discuss with patient she was getting  medications confused. We did discuss medication list again. Patient stated it back to me correctly. Patient thankful for call back.

## 2021-07-17 NOTE — Telephone Encounter (Signed)
Pt c/o medication issue:  1. Name of Medication: vitamin E 400 UNIT capsule  2. How are you currently taking this medication (dosage and times per day)? Take 400 Units by mouth daily.  3. Are you having a reaction (difficulty breathing--STAT)? no  4. What is your medication issue? Patient states that she thought medication dosage is suppose to be reduce. And needing to know if she suppose to take it all at one time for over a period.

## 2021-07-24 ENCOUNTER — Ambulatory Visit (HOSPITAL_COMMUNITY)
Admission: RE | Admit: 2021-07-24 | Discharge: 2021-07-24 | Disposition: A | Discharge status: Home / Self Care | Payer: Medicare Other | Source: Ambulatory Visit | Attending: Cardiovascular Disease | Admitting: Cardiovascular Disease

## 2021-07-24 DIAGNOSIS — R0989 Other specified symptoms and signs involving the circulatory and respiratory systems: Secondary | ICD-10-CM | POA: Insufficient documentation

## 2021-07-30 ENCOUNTER — Other Ambulatory Visit (HOSPITAL_COMMUNITY): Payer: Self-pay

## 2021-07-30 ENCOUNTER — Other Ambulatory Visit: Payer: Self-pay | Admitting: Cardiovascular Disease

## 2021-07-30 MED ORDER — METOPROLOL TARTRATE 25 MG PO TABS
25.0000 mg | ORAL_TABLET | Freq: Two times a day (BID) | ORAL | 3 refills | Status: DC
  Filled 2021-07-30 – 2021-08-17 (×2): qty 180, 90d supply, fill #0
  Filled 2021-12-03: qty 180, 90d supply, fill #1
  Filled 2022-04-07: qty 180, 90d supply, fill #2

## 2021-07-31 ENCOUNTER — Ambulatory Visit (HOSPITAL_COMMUNITY): Payer: Medicare Other | Attending: Cardiology

## 2021-07-31 DIAGNOSIS — Z953 Presence of xenogenic heart valve: Secondary | ICD-10-CM | POA: Insufficient documentation

## 2021-07-31 DIAGNOSIS — I10 Essential (primary) hypertension: Secondary | ICD-10-CM | POA: Diagnosis not present

## 2021-07-31 DIAGNOSIS — I4811 Longstanding persistent atrial fibrillation: Secondary | ICD-10-CM | POA: Diagnosis not present

## 2021-08-02 LAB — ECHOCARDIOGRAM COMPLETE
AV Mean grad: 11.6 mmHg
AV Peak grad: 22.1 mmHg
Ao pk vel: 2.35 m/s
S' Lateral: 2.2 cm

## 2021-08-07 ENCOUNTER — Other Ambulatory Visit (HOSPITAL_COMMUNITY): Payer: Self-pay

## 2021-08-07 NOTE — Patient Outreach (Signed)
Received a referral notification for Ms. [name] ---. I have assigned Raina Mina, RN to call for follow up and determine if there are any Case Management needs.    [facility], [place], [place] Management [phone]

## 2021-08-17 ENCOUNTER — Other Ambulatory Visit: Payer: Self-pay | Admitting: *Deleted

## 2021-08-17 ENCOUNTER — Other Ambulatory Visit (HOSPITAL_COMMUNITY): Payer: Self-pay

## 2021-08-17 NOTE — Patient Outreach (Signed)
[facility]) Care Management  08/17/2021  CRISTAN HOUT [birth year] [phone]  Telephone Screen-Unsuccessful #1  RN attempted outreach call today however unsuccessful. RN able to leave a HIPAA approved voice message requesting a call back.  Will attempted another outreach over the next week for pending [facility] services. Will also send outreach letter de to unsuccessful outreach.  Raina Mina, RN Care Management Coordinator [place] Office [phone]

## 2021-08-24 ENCOUNTER — Ambulatory Visit: Payer: Self-pay | Admitting: *Deleted

## 2021-09-11 ENCOUNTER — Other Ambulatory Visit (HOSPITAL_COMMUNITY): Payer: Self-pay

## 2021-09-22 ENCOUNTER — Other Ambulatory Visit (HOSPITAL_COMMUNITY): Payer: Self-pay

## 2021-10-16 ENCOUNTER — Other Ambulatory Visit (HOSPITAL_COMMUNITY): Payer: Self-pay

## 2021-10-29 ENCOUNTER — Other Ambulatory Visit (HOSPITAL_COMMUNITY): Payer: Self-pay

## 2021-10-30 ENCOUNTER — Other Ambulatory Visit (HOSPITAL_COMMUNITY): Payer: Self-pay

## 2021-11-10 ENCOUNTER — Other Ambulatory Visit (HOSPITAL_COMMUNITY): Payer: Self-pay

## 2021-11-16 ENCOUNTER — Other Ambulatory Visit (HOSPITAL_COMMUNITY): Payer: Self-pay

## 2021-12-03 ENCOUNTER — Other Ambulatory Visit (HOSPITAL_COMMUNITY): Payer: Self-pay

## 2021-12-08 ENCOUNTER — Encounter: Payer: Self-pay | Admitting: Nurse Practitioner

## 2021-12-08 ENCOUNTER — Encounter: Payer: Medicare Other | Admitting: Nurse Practitioner

## 2021-12-08 NOTE — Progress Notes (Signed)
This encounter was created in error - please disregard.

## 2021-12-09 ENCOUNTER — Other Ambulatory Visit (HOSPITAL_COMMUNITY): Payer: Self-pay

## 2021-12-25 ENCOUNTER — Ambulatory Visit (INDEPENDENT_AMBULATORY_CARE_PROVIDER_SITE_OTHER): Payer: Medicare Other | Admitting: Nurse Practitioner

## 2021-12-25 ENCOUNTER — Encounter: Payer: Self-pay | Admitting: Nurse Practitioner

## 2021-12-25 ENCOUNTER — Other Ambulatory Visit (HOSPITAL_COMMUNITY): Payer: Self-pay

## 2021-12-25 VITALS — BP 152/88 | HR 78 | Temp 97.7°F | Resp 17 | Ht 63.0 in | Wt 157.7 lb

## 2021-12-25 DIAGNOSIS — K219 Gastro-esophageal reflux disease without esophagitis: Secondary | ICD-10-CM | POA: Diagnosis not present

## 2021-12-25 DIAGNOSIS — K5901 Slow transit constipation: Secondary | ICD-10-CM

## 2021-12-25 DIAGNOSIS — Z91148 Patient's other noncompliance with medication regimen for other reason: Secondary | ICD-10-CM | POA: Diagnosis not present

## 2021-12-25 DIAGNOSIS — J383 Other diseases of vocal cords: Secondary | ICD-10-CM

## 2021-12-25 DIAGNOSIS — N3281 Overactive bladder: Secondary | ICD-10-CM

## 2021-12-25 DIAGNOSIS — Z8673 Personal history of transient ischemic attack (TIA), and cerebral infarction without residual deficits: Secondary | ICD-10-CM

## 2021-12-25 DIAGNOSIS — M1712 Unilateral primary osteoarthritis, left knee: Secondary | ICD-10-CM | POA: Diagnosis not present

## 2021-12-25 DIAGNOSIS — N183 Chronic kidney disease, stage 3 unspecified: Secondary | ICD-10-CM

## 2021-12-25 DIAGNOSIS — I10 Essential (primary) hypertension: Secondary | ICD-10-CM | POA: Diagnosis not present

## 2021-12-25 DIAGNOSIS — I739 Peripheral vascular disease, unspecified: Secondary | ICD-10-CM | POA: Diagnosis not present

## 2021-12-25 DIAGNOSIS — Z23 Encounter for immunization: Secondary | ICD-10-CM

## 2021-12-25 DIAGNOSIS — G319 Degenerative disease of nervous system, unspecified: Secondary | ICD-10-CM

## 2021-12-25 DIAGNOSIS — M8580 Other specified disorders of bone density and structure, unspecified site: Secondary | ICD-10-CM | POA: Diagnosis not present

## 2021-12-25 DIAGNOSIS — I480 Paroxysmal atrial fibrillation: Secondary | ICD-10-CM | POA: Diagnosis not present

## 2021-12-25 MED ORDER — DICLOFENAC SODIUM 1 % EX GEL
4.0000 g | Freq: Four times a day (QID) | CUTANEOUS | 1 refills | Status: AC | PRN
  Filled 2021-12-25: qty 100, 7d supply, fill #0

## 2021-12-25 MED ORDER — ACETAMINOPHEN ER 650 MG PO TBCR: 650.0000 mg | EXTENDED_RELEASE_TABLET | Freq: Two times a day (BID) | ORAL | Status: DC

## 2021-12-25 NOTE — Progress Notes (Signed)
Careteam: Patient Care Team: Lauree Chandler, NP as PCP - General (Geriatric Medicine) Druscilla Brownie, MD as Consulting Physician (Dermatology) [name] Irani, MD (Inactive) as Consulting Physician (Gastroenterology) Neldon Mc, MD (Inactive) as Consulting Physician (General Surgery) Lorretta Harp, MD as Consulting Physician (Cardiology) Bjorn Loser, MD as Consulting Physician (Urology) Pieter Partridge, DO as Consulting Physician (Neurology)  PLACE OF SERVICE:  [place]  Advanced Directive information    Allergies  Allergen Reactions   Lisinopril Cough   Ceftriaxone Rash   Codeine Rash    All over the body.    Chief Complaint  Patient presents with   Medical Management of Chronic Issues    6 month follow up   Immunizations    Discussed the need for covid And flu vaccine   Quality Metric Gaps    Discussed the need for Medicare annual wellness visit     HPI: Patient is a 88 y.o. female for routine follow up    Blood pressure elevated. Does not take her medication regularly but did take today.   Her son is present and helps her with her medication. We reviewed each medication and discussed the necessity, to help with pill burden and increase compliance. She gets very agitated when talking about medication  He states she is sleeping more than normal, sometimes sleeps well into the noon. She has not taken the vistaril in a long time. (Will remove from list)   Per her son, she has been hallucinating a lot more. She will see snakes and ants. She is having more outbursts and just walking out. Feels like son is making her take medication that she does not need and making her take more than prescribed.   Weight has been stable.   Osteopenia: Calcium-vitamin D BID  OAB: takes myrbetriq.   She c/o of arthritic pain in her left knee. Will take tylenol.   Review of Systems:  Review of Systems  Constitutional:  Negative for fever and weight loss.   HENT:  Negative for hearing loss and nosebleeds.   Eyes:  Negative for blurred vision and double vision.  Respiratory:  Negative for cough, shortness of breath and wheezing.   Cardiovascular:  Negative for chest pain and leg swelling.  Gastrointestinal:  Negative for abdominal pain, constipation, diarrhea and heartburn.  Genitourinary:  Negative for dysuria and hematuria.  Musculoskeletal:  Positive for joint pain. Negative for back pain.  Neurological:  Negative for dizziness and headaches.  Endo/Heme/Allergies:  Positive for environmental allergies.  Psychiatric/Behavioral:  Negative for depression. The patient is not nervous/anxious.     Past Medical History:  Diagnosis Date   Aortic stenosis 07/20/2012   Aortic stenosis    Aortic valve disorder 08/13/2011   ECHO - EF >75%; mild diastolic dysfunction; calcified aortic valve, not well visualized; mod/severe aortic stenosis w/ worsening gradients when compared to 2012   Arthritis    Cancer ([place])    Breast   Carotid bruit 08/06/2008   Doppler - R ECA demonstrates noarrowing w/ elevated velocities consistent w/ >70% diameter reduction; R and L ICAs show no evidence of diameter reduction, significant tortuosity or vascular abnormality;    Change in voice    COPD (chronic obstructive pulmonary disease) (HCC)    Hearing loss    Heart murmur    Hyperlipidemia    Hypertension    dr Gwenlyn Found   Osteoporosis    PAF (paroxysmal atrial fibrillation) ([place]) 09/04/2012   PVD (peripheral vascular disease) ([place]) 08/13/2010  R/P MV - normal pattern of perfusion in all regions, EF 76%; no significant wall abnormalities noted; normal perfusion study   Right carotid bruit    high-grade right external carotid artery stenosis by 2 parts ultrasound 2 years ago   S/P aortic valve replacement with bioprosthetic valve 08/30/2012   21 mm [facility] Ease bovine pericardial tissue valve   Stroke (cerebrum) [facility])    Past Surgical History:  Procedure Laterality  Date   ABDOMINAL HYSTERECTOMY     Partial   AORTIC VALVE REPLACEMENT N/A 08/30/2012   Procedure: AORTIC VALVE REPLACEMENT (AVR);  Surgeon: Rexene Alberts, MD;  Location: [place];  Service: Open Heart Surgery;  Laterality: N/A;   BIOPSY SHOULDER Left 10/08/2005   shave biopsy   BREAST LUMPECTOMY  02/25/1997   right   [place] Left 1997   Breast implant   EYE SURGERY  1997   Cataract surgery   INTRAOPERATIVE TRANSESOPHAGEAL ECHOCARDIOGRAM N/A 08/30/2012   Procedure: INTRAOPERATIVE TRANSESOPHAGEAL ECHOCARDIOGRAM;  Surgeon: Rexene Alberts, MD;  Location: [place];  Service: Open Heart Surgery;  Laterality: N/A;   LEFT AND RIGHT HEART CATHETERIZATION WITH CORONARY ANGIOGRAM N/A 08/14/2012   Procedure: LEFT AND RIGHT HEART CATHETERIZATION WITH CORONARY ANGIOGRAM;  Surgeon: Lorretta Harp, MD;  Location: [facility] CATH LAB;  Service: Cardiovascular;  Laterality: N/A;   LEFT HEART CATH  08/14/12   Nl cors, AS   MASTECTOMY  09/29/95   left   PARTIAL HYSTERECTOMY     TOTAL HIP ARTHROPLASTY  09/2010   Social History:   reports that she has never smoked. She has never used smokeless tobacco. She reports that she does not drink alcohol and does not use drugs.  Family History  Problem Relation Age of Onset   Cancer Mother        Bladder   Early death Father        Tractor accident   Early death Brother        Radiation protection practitioner   Early death Brother        during heart surgery    Medications: Patient's Medications  New Prescriptions   No medications on file  Previous Medications   ACETAMINOPHEN (TYLENOL) 325 MG TABLET    Take 650 mg by mouth at bedtime as needed (pain).   ACETAMINOPHEN (TYLENOL) 650 MG CR TABLET    Take 650 mg by mouth every morning.   ALBUTEROL (PROAIR HFA) 108 (90 BASE) MCG/ACT INHALER    INHALE 1 PUFF INTO THE LUNGS EVERY 6 (SIX) HOURS AS NEEDED FOR WHEEZING OR SHORTNESS OF BREATH.   APIXABAN (ELIQUIS) 5 MG TABS TABLET    Take 1  tablet (5 mg total) by mouth 2 (two) times daily.   ASCORBIC ACID (VITAMIN C) 100 MG TABLET    Take 100 mg by mouth daily.   ATORVASTATIN (LIPITOR) 40 MG TABLET    Take 1 tablet (40 mg total) by mouth daily.   CALCIUM CITRATE-VITAMIN D (CITRACAL + D PO)    Take 1 tablet by mouth 2 (two) times daily.    CHLORPHENIRAMINE MALEATE (EQ CHLORTABS PO)    Take 1 tablet by mouth every 4 (four) hours as needed.   FAMOTIDINE (PEPCID) 20 MG TABLET    One after supper   FLUTICASONE (FLONASE) 50 MCG/ACT NASAL SPRAY    PLACE 1 SPRAY INTO BOTH NOSTRILS TWICE DAILY AS NEEDED FOR ALLERGIES OR RHINITIS.  MAGNESIUM HYDROXIDE (PHILLIPS MILK OF MAGNESIA PO)    Take 15 mL by mouth. Take daily at 6 o'clock at night   METOPROLOL TARTRATE (LOPRESSOR) 25 MG TABLET    Take 1 tablet (25 mg total) by mouth 2 (two) times daily.   MIRABEGRON ER (MYRBETRIQ) 50 MG TB24 TABLET    Take 1 tablet (50 mg total) by mouth daily.   OMEGA-3 FATTY ACIDS (FISH OIL PO)    Take 1 capsule by mouth daily.   SPECIALTY VITAMINS PRODUCTS (ADVANCED COLLAGEN PO)    Take 1,000 mg by mouth daily.   VALSARTAN (DIOVAN) 160 MG TABLET    Take 1 tablet (160 mg total) by mouth in the morning.   VALSARTAN (DIOVAN) 80 MG TABLET    Take 1 tablet (80 mg total) by mouth every evening.   VITAMIN E 400 UNIT CAPSULE    Take 400 Units by mouth daily.  Modified Medications   No medications on file  Discontinued Medications   AMOXICILLIN (AMOXIL) 500 MG TABLET    Take 4 by mouth 1 hour prior to dental procedure   DEXTROMETHORPHAN-GUAIFENESIN (MUCINEX DM) 30-600 MG 12HR TABLET    Take 1 tablet by mouth 2 (two) times daily as needed.    HYDROXYZINE (ATARAX/VISTARIL) 25 MG TABLET    Take one tablet by mouth daily as needed for itching.    Physical Exam:  Vitals:   12/25/21 1357  BP: (!) 152/88  Pulse: 78  Resp: 17  Temp: 97.7 F (36.5 C)  TempSrc: Temporal  SpO2: 96%  Weight: 71.5 kg  Height: _0  (1.6 m)   Body mass index is 27.94 kg/m. Wt Readings  from Last 3 Encounters:  12/25/21 157 lb 11.2 oz (71.5 kg)  07/15/21 154 lb 6.4 oz (70 kg)  06/26/21 151 lb 6 oz (68.7 kg)    Physical Exam Constitutional:      General: She is not in acute distress. HENT:     Mouth/Throat:     Mouth: Mucous membranes are moist.  Eyes:     Pupils: Pupils are equal, round, and reactive to light.  Cardiovascular:     Rate and Rhythm: Normal rate and regular rhythm.     Pulses: Normal pulses.     Heart sounds: Murmur heard.  Pulmonary:     Effort: Pulmonary effort is normal. No respiratory distress.     Breath sounds: Normal breath sounds. No wheezing.  Abdominal:     General: Abdomen is flat. Bowel sounds are normal. There is no distension.     Palpations: Abdomen is soft.     Tenderness: There is no abdominal tenderness.  Musculoskeletal:     Right lower leg: No edema.     Left lower leg: No edema.  Skin:    General: Skin is warm and dry.  Neurological:     Mental Status: She is alert. Mental status is at baseline.     Comments: dysarthric  Psychiatric:        Mood and Affect: Mood normal.        Behavior: Behavior normal.     Labs reviewed: Basic Metabolic Panel: Recent Labs    07/03/21 1413  NA 141  K 4.0  CL 104  CO2 24  GLUCOSE 87  BUN 14  CREATININE 1.12*  CALCIUM 10.1  TSH 0.85   Liver Function Tests: Recent Labs    07/03/21 1413  AST 22  ALT 10  BILITOT 1.4*  PROT 6.3   No  results for input(s): "LIPASE", "AMYLASE" in the last 8760 hours. No results for input(s): "AMMONIA" in the last 8760 hours. CBC: Recent Labs    07/03/21 1413  WBC 7.5  NEUTROABS 4,973  HGB 13.2  HCT 38.4  MCV 97.7  PLT 170   Lipid Panel: Recent Labs    07/03/21 1413  CHOL 140  HDL 60  LDLCALC 65  TRIG 72  CHOLHDL 2.3   TSH: Recent Labs    07/03/21 1413  TSH 0.85   A1C: Lab Results  Component Value Date   HGBA1C 5.3 06/24/2020     Assessment/Plan 1. Needs flu shot - received in office today  - Flu Vaccine  QUAD High Dose(Fluad)  2. Primary osteoarthritis of left knee - acetaminophen (TYLENOL) 650 MG CR tablet; Take 1 tablet (650 mg total) by mouth 2 (two) times daily. - diclofenac Sodium (VOLTAREN) 1 % GEL; Apply 4 g topically 4 (four) times daily as needed.  Dispense: 100 g; Refill: 1  3. Osteopenia, unspecified location - Continue Calcium Citrate- Vitamin D  4. Slow transit constipation - controlled  - continue milk of magnesia PRN  5. History of CVA (cerebrovascular accident) - continue eliquis - continue lipitor -encouraged proper BP control and compliance with medication management.   6. Overactive bladder - Continue Myrbetriq  7. Peripheral vascular disease, unspecified ([place]) - continue lipitor and continues on eliquis BID  8. Gastroesophageal reflux disease without esophagitis - controlled, does not need medication routinely  9. PAF (paroxysmal atrial fibrillation) (HCC) - continue eliquis and metoprolol   10. Stage 3 chronic kidney disease, unspecified whether stage 3a or 3b CKD ([place]) Encourage proper hydration Follow metabolic panel Avoid nephrotoxic meds (NSAIDS)  11. Essential hypertension -pt got very upset when talking about medication today and did not allow for recheck of BP.  -goal BP <140/90, low sodium diet.  - continue valsartan and metoprolol  - CBC with Differential/Platelet - CMP with eGFR([facility])  12. Laryngeal dystonia - chronic and stable.   13. Medication noncompliance due to excessive pill burden  - Multiple medications stopped at this visit including Pepcid, atarax, Omega 3, Vitamin E, Multivitamin, Albuterol, Chlorpheniramine, Mucinex DM.   Return in about 4 months (around 04/27/2022).  Student- Waunita Schooner, RN I personally was present during the history, physical exam and medical decision-making activities of this service and have verified that the service and findings are accurately documented in the student's note  Makaylia Hewett K.  [place], [facility] [phone]

## 2021-12-26 LAB — CBC WITH DIFFERENTIAL/PLATELET
Absolute Monocytes: 655 cells/uL (ref 200–950)
Basophils Absolute: 39 cells/uL (ref 0–200)
Basophils Relative: 0.5 %
Eosinophils Absolute: 185 cells/uL (ref 15–500)
Eosinophils Relative: 2.4 %
HCT: 37.1 % (ref 35.0–45.0)
Hemoglobin: 12.6 g/dL (ref 11.7–15.5)
Lymphs Abs: 2264 cells/uL (ref 850–3900)
MCH: 32.7 pg (ref 27.0–33.0)
MCHC: 34 g/dL (ref 32.0–36.0)
MCV: 96.4 fL (ref 80.0–100.0)
MPV: 10.3 fL (ref 7.5–12.5)
Monocytes Relative: 8.5 %
Neutro Abs: 4558 cells/uL (ref 1500–7800)
Neutrophils Relative %: 59.2 %
Platelets: 170 10*3/uL (ref 140–400)
RBC: 3.85 10*6/uL (ref 3.80–5.10)
RDW: 12.7 % (ref 11.0–15.0)
Total Lymphocyte: 29.4 %
WBC: 7.7 10*3/uL (ref 3.8–10.8)

## 2021-12-26 LAB — COMPLETE METABOLIC PANEL WITH GFR
AG Ratio: 2 (calc) (ref 1.0–2.5)
ALT: 12 U/L (ref 6–29)
AST: 17 U/L (ref 10–35)
Albumin: 4.3 g/dL (ref 3.6–5.1)
Alkaline phosphatase (APISO): 90 U/L (ref 37–153)
BUN/Creatinine Ratio: 13 (calc) (ref 6–22)
BUN: 13 mg/dL (ref 7–25)
CO2: 27 mmol/L (ref 20–32)
Calcium: 9.6 mg/dL (ref 8.6–10.4)
Chloride: 106 mmol/L (ref 98–110)
Creat: 1 mg/dL — ABNORMAL HIGH (ref 0.60–0.95)
Globulin: 2.1 g/dL (calc) (ref 1.9–3.7)
Glucose, Bld: 98 mg/dL (ref 65–139)
Potassium: 4 mmol/L (ref 3.5–5.3)
Sodium: 141 mmol/L (ref 135–146)
Total Bilirubin: 0.9 mg/dL (ref 0.2–1.2)
Total Protein: 6.4 g/dL (ref 6.1–8.1)
eGFR: 54 mL/min/{1.73_m2} — ABNORMAL LOW (ref >=60)

## 2021-12-29 ENCOUNTER — Other Ambulatory Visit (HOSPITAL_COMMUNITY): Payer: Self-pay

## 2022-01-06 ENCOUNTER — Ambulatory Visit (INDEPENDENT_AMBULATORY_CARE_PROVIDER_SITE_OTHER): Payer: Medicare Other | Admitting: Family

## 2022-01-06 ENCOUNTER — Ambulatory Visit
Admission: RE | Admit: 2022-01-06 | Discharge: 2022-01-06 | Disposition: A | Discharge status: Home / Self Care | Payer: Medicare Other | Source: Ambulatory Visit | Attending: Family | Admitting: Family

## 2022-01-06 ENCOUNTER — Encounter: Payer: Self-pay | Admitting: Family

## 2022-01-06 VITALS — BP 120/70 | HR 76 | Temp 98.2°F | Resp 18 | Ht 63.0 in | Wt 155.1 lb

## 2022-01-06 DIAGNOSIS — M79602 Pain in left arm: Secondary | ICD-10-CM | POA: Diagnosis not present

## 2022-01-06 DIAGNOSIS — G8929 Other chronic pain: Secondary | ICD-10-CM | POA: Diagnosis not present

## 2022-01-06 DIAGNOSIS — M25512 Pain in left shoulder: Secondary | ICD-10-CM | POA: Diagnosis not present

## 2022-01-06 NOTE — Patient Instructions (Addendum)
-   Please get left arm X-ray at [place] at [facility] then will call you with results.  - continue with extra strength tylenol   - Apply Voltaren gel shoulder

## 2022-01-06 NOTE — Progress Notes (Signed)
Provider: Kirtan Sada FNP-C  Lauree Chandler, NP  Patient Care Team: Lauree Chandler, NP as PCP - General (Geriatric Medicine) Druscilla Brownie, MD as Consulting Physician (Dermatology) [name] Irani, MD (Inactive) as Consulting Physician (Gastroenterology) Neldon Mc, MD (Inactive) as Consulting Physician (General Surgery) Lorretta Harp, MD as Consulting Physician (Cardiology) Bjorn Loser, MD as Consulting Physician (Urology) Pieter Partridge, DO as Consulting Physician (Neurology)  Extended Emergency Contact Information Primary Emergency Contact: [place] Phone: [phone] Mobile Phone: [phone] Relation: Daughter Secondary Emergency Contact: [place], [place] Mobile Phone: [phone] Relation: Son  Code Status:  Full Code  Goals of care: Advanced Directive information    11/28/2020   11:22 AM  Advanced Directives  Does Patient Have a Medical Advance Directive? No  Does patient want to make changes to medical advance directive? Yes (MAU/Ambulatory/Procedural Areas - Information given)     Chief Complaint  Patient presents with   Acute Visit    Upset stomach with headaches along with upper left extremities     HPI:  Pt is a 88 y.o. female seen today for an acute visit for evaluation of nausea,constipation and headache. Took milk of magnesium for constipation. She did not eat since yesterday evening or take her medicine.Just eat 2 hrs ago when son took her out .had egg cheese and bacon biscuit without any nausea or vomiting. She does not eat any food at her house.Has been shaky since she did not eat last night.  Main concern today is left arm worsening pain.usually has left shoulder pain but seems to have worsen.denies any numbness,tingling or weakness.does not recall any injuries or fall episode.takes tylenol for pain.  Past Medical History:  Diagnosis Date   Aortic stenosis 07/20/2012   Aortic stenosis     Aortic valve disorder 08/13/2011   ECHO - EF >31%; mild diastolic dysfunction; calcified aortic valve, not well visualized; mod/severe aortic stenosis w/ worsening gradients when compared to 2012   Arthritis    Cancer ([place])    Breast   Carotid bruit 08/06/2008   Doppler - R ECA demonstrates noarrowing w/ elevated velocities consistent w/ >70% diameter reduction; R and L ICAs show no evidence of diameter reduction, significant tortuosity or vascular abnormality;    Change in voice    COPD (chronic obstructive pulmonary disease) (HCC)    Hearing loss    Heart murmur    Hyperlipidemia    Hypertension    dr Gwenlyn Found   Osteoporosis    PAF (paroxysmal atrial fibrillation) ([place]) 09/04/2012   PVD (peripheral vascular disease) ([place]) 08/13/2010   R/P MV - normal pattern of perfusion in all regions, EF 76%; no significant wall abnormalities noted; normal perfusion study   Right carotid bruit    high-grade right external carotid artery stenosis by 2 parts ultrasound 2 years ago   S/P aortic valve replacement with bioprosthetic valve 08/30/2012   21 mm [facility] Ease bovine pericardial tissue valve   Stroke (cerebrum) [facility])    Past Surgical History:  Procedure Laterality Date   ABDOMINAL HYSTERECTOMY     Partial   AORTIC VALVE REPLACEMENT N/A 08/30/2012   Procedure: AORTIC VALVE REPLACEMENT (AVR);  Surgeon: Rexene Alberts, MD;  Location: [place];  Service: Open Heart Surgery;  Laterality: N/A;   BIOPSY SHOULDER Left 10/08/2005   shave biopsy   BREAST LUMPECTOMY  02/25/1997   right   [place]  Left 1997   Breast implant   EYE SURGERY  1997   Cataract surgery   INTRAOPERATIVE TRANSESOPHAGEAL ECHOCARDIOGRAM N/A 08/30/2012   Procedure: INTRAOPERATIVE TRANSESOPHAGEAL ECHOCARDIOGRAM;  Surgeon: Rexene Alberts, MD;  Location: [place];  Service: Open Heart Surgery;  Laterality: N/A;   LEFT AND RIGHT HEART CATHETERIZATION WITH CORONARY ANGIOGRAM N/A  08/14/2012   Procedure: LEFT AND RIGHT HEART CATHETERIZATION WITH CORONARY ANGIOGRAM;  Surgeon: Lorretta Harp, MD;  Location[facility] C[facility] LAB;  Service: Cardiovascular;  Laterality: N/A;   LEFT HEART CATH  08/14/12   Nl cors, AS   MASTECTOMY  09/29/95   left   PARTIAL HYSTERECTOMY     TOTAL HIP ARTHROPLASTY  09/2010    Allergies  Allergen Reactions   Lisinopril Cough   Ceftriaxone Rash   Codeine Rash    All over the body.    Outpatient Encounter Medications as of 01/06/2022  Medication Sig   acetaminophen (TYLENOL) 650 MG CR tablet Take 1 tablet (650 mg total) by mouth 2 (two) times daily.   apixaban (ELIQUIS) 5 MG TABS tablet Take 1 tablet (5 mg total) by mouth 2 (two) times daily.   Ascorbic Acid (VITAMIN C) 100 MG tablet Take 100 mg by mouth daily.   atorvastatin (LIPITOR) 40 MG tablet Take 1 tablet (40 mg total) by mouth daily.   Calcium Citrate-Vitamin D (CITRACAL + D PO) Take 1 tablet by mouth 2 (two) times daily.    diclofenac Sodium (VOLTAREN) 1 % GEL Apply 4 g topically 4 (four) times daily as needed.   Magnesium Hydroxide (PHILLIPS MILK OF MAGNESIA PO) Take 15 mL by mouth. Take daily at 6 o'clock at night   metoprolol tartrate (LOPRESSOR) 25 MG tablet Take 1 tablet (25 mg total) by mouth 2 (two) times daily.   mirabegron ER (MYRBETRIQ) 50 MG TB24 tablet Take 1 tablet (50 mg total) by mouth daily.   valsartan (DIOVAN) 160 MG tablet Take 1 tablet (160 mg total) by mouth in the morning.   valsartan (DIOVAN) 80 MG tablet Take 1 tablet (80 mg total) by mouth every evening.   No facility-administered encounter medications on file as of 01/06/2022.    Review of Systems  Musculoskeletal:  Positive for arthralgias and gait problem.       Left shoulder and arm pain unable to raise arm     Immunization History  Administered Date(s) Administered   Fluad Quad(high Dose 65+) 12/20/2018, 11/12/2019, 01/02/2021, 12/25/2021   Influenza,inj,Quad PF,6+ Mos 11/29/2012, 11/27/2013,  11/27/2014, 12/09/2015, 01/11/2017, 12/20/2017   PFIZER(Purple Top)SARS-COV-2 Vaccination 11/26/2019, 12/17/2019, 07/14/2020   Pneumococcal Conjugate-13 05/21/2014   Pneumococcal Polysaccharide-23 01/11/2005, 08/15/2012   Tdap 05/28/2014   Zoster Recombinat (Shingrix) 06/24/2017, 11/02/2017   Zoster, Live 01/14/2009   Pertinent  Health Maintenance Due  Topic Date Due   INFLUENZA VACCINE  Completed   DEXA SCAN  Completed      10/01/2020    3:21 PM 11/25/2020    3:26 PM 11/28/2020   11:21 AM 12/08/2021   10:00 AM 01/06/2022    2:25 PM  Fall Risk  Falls in the past year? 0 0 0 0 0  Was there an injury with Fall? 0 0 0 0 0  Fall Risk Category Calculator 0 0 0 0 0  Fall Risk Category Low Low Low Low Low  Patient Fall Risk Level Low fall risk Low fall risk Low fall risk Low fall risk Low fall risk  Patient at Risk for Falls Due to No Fall  Risks No Fall Risks No Fall Risks No Fall Risks No Fall Risks  Fall risk Follow up Falls evaluation completed Falls evaluation completed Falls evaluation completed Falls evaluation completed Falls evaluation completed   Functional Status Survey:    Vitals:   01/06/22 1422  BP: 120/70  Pulse: 76  Resp: 18  Temp: 98.2 F (36.8 C)  SpO2: 98%  Weight: 155 lb 2 oz (70.4 kg)  Height: '5\' 3"'$  (1.6 m)   Body mass index is 27.48 kg/m. Physical Exam Vitals reviewed.  Constitutional:      General: She is not in acute distress.    Appearance: Normal appearance. She is overweight. She is not ill-appearing or diaphoretic.  HENT:     Head: Normocephalic.     Right Ear: Tympanic membrane, ear canal and external ear normal. There is no impacted cerumen.     Left Ear: Tympanic membrane, ear canal and external ear normal. There is no impacted cerumen.     Nose: Nose normal. No congestion or rhinorrhea.     Mouth/Throat:     Mouth: Mucous membranes are moist.     Pharynx: Oropharynx is clear. No oropharyngeal exudate or posterior oropharyngeal erythema.   Eyes:     General: No scleral icterus.       Right eye: No discharge.        Left eye: No discharge.     Extraocular Movements: Extraocular movements intact.     Conjunctiva/sclera: Conjunctivae normal.     Pupils: Pupils are equal, round, and reactive to light.  Neck:     Vascular: No carotid bruit.  Cardiovascular:     Rate and Rhythm: Normal rate and regular rhythm.     Pulses: Normal pulses.     Heart sounds: Normal heart sounds. No murmur heard.    No friction rub. No gallop.  Pulmonary:     Effort: Pulmonary effort is normal. No respiratory distress.     Breath sounds: Normal breath sounds. No wheezing, rhonchi or rales.  Chest:     Chest wall: No tenderness.  Abdominal:     General: Bowel sounds are normal. There is no distension.     Palpations: Abdomen is soft. There is no mass.     Tenderness: There is no abdominal tenderness. There is no right CVA tenderness, left CVA tenderness, guarding or rebound.  Musculoskeletal:        General: No swelling.     Right shoulder: Normal.     Left shoulder: Tenderness present. No swelling, effusion or crepitus. Decreased range of motion. Normal strength.     Right upper arm: Normal.     Left upper arm: Tenderness present. No swelling or edema.     Cervical back: Normal range of motion. No rigidity or tenderness.     Right lower leg: No edema.     Left lower leg: No edema.  Lymphadenopathy:     Cervical: No cervical adenopathy.  Skin:    General: Skin is warm and dry.     Coloration: Skin is not pale.     Findings: No bruising, erythema, lesion or rash.  Neurological:     Mental Status: She is alert and oriented to person, place, and time.     Cranial Nerves: No cranial nerve deficit.     Sensory: No sensory deficit.     Motor: No weakness.     Coordination: Coordination normal.     Gait: Gait normal.  Psychiatric:  Mood and Affect: Mood normal.        Speech: Speech normal.        Behavior: Behavior normal.         Thought Content: Thought content normal.        Judgment: Judgment normal.     Labs reviewed: Recent Labs    07/03/21 1413 12/25/21 1437  NA 141 141  K 4.0 4.0  CL 104 106  CO2 24 27  GLUCOSE 87 98  BUN 14 13  CREATININE 1.12* 1.00*  CALCIUM 10.1 9.6   Recent Labs    07/03/21 1413 12/25/21 1437  AST 22 17  ALT 10 12  BILITOT 1.4* 0.9  PROT 6.3 6.4   Recent Labs    07/03/21 1413 12/25/21 1437  WBC 7.5 7.7  NEUTROABS 4,973 4,558  HGB 13.2 12.6  HCT 38.4 37.1  MCV 97.7 96.4  PLT 170 170   Lab Results  Component Value Date   TSH 0.85 07/03/2021   Lab Results  Component Value Date   HGBA1C 5.3 06/24/2020   Lab Results  Component Value Date   CHOL 140 07/03/2021   HDL 60 07/03/2021   LDLCALC 65 07/03/2021   TRIG 72 07/03/2021   CHOLHDL 2.3 07/03/2021    Significant Diagnostic Results in last 30 days:  No results found.  Assessment/Plan 1. Chronic left shoulder pain Chronic but has worsened No weakness or numbness or tingling on the hand Discuss obtaining imaging to rule out acute abnormalities - Please get left shoulder X-ray at [place] at [facility] then will call you with results. - continue with extra strength tylenol  - DG Shoulder Left; Future  2. Pain of left upper extremity Shoulder pain that radiates to upper arm which has worsened - continue with extra strength tylenol  - DG Humerus Left; Future  Family/ staff Communication: Reviewed plan of care with patient verbalized understanding  Labs/tests ordered:   - DG Shoulder Left; Future - DG Humerus Left; Future  Next Appointment:Return if symptoms worsen or fail to improve.    Sandrea Hughs, NP

## 2022-01-08 ENCOUNTER — Other Ambulatory Visit (HOSPITAL_COMMUNITY): Payer: Self-pay

## 2022-01-12 ENCOUNTER — Other Ambulatory Visit: Payer: Self-pay | Admitting: Family

## 2022-01-12 DIAGNOSIS — G8929 Other chronic pain: Secondary | ICD-10-CM

## 2022-01-12 DIAGNOSIS — M79602 Pain in left arm: Secondary | ICD-10-CM

## 2022-01-15 ENCOUNTER — Other Ambulatory Visit (HOSPITAL_COMMUNITY): Payer: Self-pay

## 2022-01-24 ENCOUNTER — Encounter (HOSPITAL_COMMUNITY): Payer: Self-pay | Admitting: Emergency Medicine

## 2022-01-24 ENCOUNTER — Inpatient Hospital Stay (HOSPITAL_COMMUNITY)
Admission: EM | Admit: 2022-01-24 | Discharge: 2022-01-29 | DRG: 417 | Disposition: A | Discharge status: Home Health | Payer: Medicare Other | Attending: Internal Medicine | Admitting: Internal Medicine

## 2022-01-24 ENCOUNTER — Emergency Department (HOSPITAL_COMMUNITY): Payer: Medicare Other

## 2022-01-24 DIAGNOSIS — F05 Delirium due to known physiological condition: Secondary | ICD-10-CM | POA: Diagnosis not present

## 2022-01-24 DIAGNOSIS — Z853 Personal history of malignant neoplasm of breast: Secondary | ICD-10-CM | POA: Diagnosis not present

## 2022-01-24 DIAGNOSIS — J449 Chronic obstructive pulmonary disease, unspecified: Secondary | ICD-10-CM | POA: Diagnosis not present

## 2022-01-24 DIAGNOSIS — Z79899 Other long term (current) drug therapy: Secondary | ICD-10-CM

## 2022-01-24 DIAGNOSIS — Z01810 Encounter for preprocedural cardiovascular examination: Secondary | ICD-10-CM | POA: Diagnosis not present

## 2022-01-24 DIAGNOSIS — Z8673 Personal history of transient ischemic attack (TIA), and cerebral infarction without residual deficits: Secondary | ICD-10-CM

## 2022-01-24 DIAGNOSIS — Z810 Family history of intellectual disabilities: Secondary | ICD-10-CM

## 2022-01-24 DIAGNOSIS — I4891 Unspecified atrial fibrillation: Secondary | ICD-10-CM | POA: Diagnosis present

## 2022-01-24 DIAGNOSIS — Z96649 Presence of unspecified artificial hip joint: Secondary | ICD-10-CM | POA: Diagnosis present

## 2022-01-24 DIAGNOSIS — Z9049 Acquired absence of other specified parts of digestive tract: Secondary | ICD-10-CM

## 2022-01-24 DIAGNOSIS — Z885 Allergy status to narcotic agent status: Secondary | ICD-10-CM | POA: Diagnosis not present

## 2022-01-24 DIAGNOSIS — R0789 Other chest pain: Secondary | ICD-10-CM | POA: Diagnosis not present

## 2022-01-24 DIAGNOSIS — R109 Unspecified abdominal pain: Secondary | ICD-10-CM | POA: Diagnosis not present

## 2022-01-24 DIAGNOSIS — N1831 Chronic kidney disease, stage 3a: Secondary | ICD-10-CM | POA: Diagnosis not present

## 2022-01-24 DIAGNOSIS — M810 Age-related osteoporosis without current pathological fracture: Secondary | ICD-10-CM | POA: Diagnosis present

## 2022-01-24 DIAGNOSIS — I129 Hypertensive chronic kidney disease with stage 1 through stage 4 chronic kidney disease, or unspecified chronic kidney disease: Secondary | ICD-10-CM | POA: Diagnosis present

## 2022-01-24 DIAGNOSIS — Z809 Family history of malignant neoplasm, unspecified: Secondary | ICD-10-CM

## 2022-01-24 DIAGNOSIS — Z01818 Encounter for other preprocedural examination: Secondary | ICD-10-CM | POA: Diagnosis not present

## 2022-01-24 DIAGNOSIS — N183 Chronic kidney disease, stage 3 unspecified: Secondary | ICD-10-CM | POA: Diagnosis present

## 2022-01-24 DIAGNOSIS — Z888 Allergy status to other drugs, medicaments and biological substances status: Secondary | ICD-10-CM

## 2022-01-24 DIAGNOSIS — Z7901 Long term (current) use of anticoagulants: Secondary | ICD-10-CM | POA: Diagnosis not present

## 2022-01-24 DIAGNOSIS — I4821 Permanent atrial fibrillation: Secondary | ICD-10-CM | POA: Diagnosis present

## 2022-01-24 DIAGNOSIS — I639 Cerebral infarction, unspecified: Secondary | ICD-10-CM | POA: Diagnosis present

## 2022-01-24 DIAGNOSIS — G9341 Metabolic encephalopathy: Secondary | ICD-10-CM | POA: Diagnosis present

## 2022-01-24 DIAGNOSIS — D6869 Other thrombophilia: Secondary | ICD-10-CM | POA: Diagnosis present

## 2022-01-24 DIAGNOSIS — K8000 Calculus of gallbladder with acute cholecystitis without obstruction: Secondary | ICD-10-CM | POA: Diagnosis not present

## 2022-01-24 DIAGNOSIS — K760 Fatty (change of) liver, not elsewhere classified: Secondary | ICD-10-CM | POA: Diagnosis not present

## 2022-01-24 DIAGNOSIS — Z952 Presence of prosthetic heart valve: Secondary | ICD-10-CM | POA: Diagnosis not present

## 2022-01-24 DIAGNOSIS — I739 Peripheral vascular disease, unspecified: Secondary | ICD-10-CM | POA: Diagnosis present

## 2022-01-24 DIAGNOSIS — H9190 Unspecified hearing loss, unspecified ear: Secondary | ICD-10-CM | POA: Diagnosis present

## 2022-01-24 DIAGNOSIS — I10 Essential (primary) hypertension: Secondary | ICD-10-CM | POA: Diagnosis not present

## 2022-01-24 DIAGNOSIS — K810 Acute cholecystitis: Secondary | ICD-10-CM | POA: Diagnosis present

## 2022-01-24 DIAGNOSIS — M1990 Unspecified osteoarthritis, unspecified site: Secondary | ICD-10-CM | POA: Diagnosis not present

## 2022-01-24 DIAGNOSIS — Z953 Presence of xenogenic heart valve: Secondary | ICD-10-CM

## 2022-01-24 DIAGNOSIS — R1011 Right upper quadrant pain: Secondary | ICD-10-CM | POA: Diagnosis not present

## 2022-01-24 DIAGNOSIS — E785 Hyperlipidemia, unspecified: Secondary | ICD-10-CM | POA: Diagnosis present

## 2022-01-24 DIAGNOSIS — J90 Pleural effusion, not elsewhere classified: Secondary | ICD-10-CM | POA: Diagnosis not present

## 2022-01-24 DIAGNOSIS — K8010 Calculus of gallbladder with chronic cholecystitis without obstruction: Secondary | ICD-10-CM | POA: Diagnosis not present

## 2022-01-24 DIAGNOSIS — R079 Chest pain, unspecified: Secondary | ICD-10-CM | POA: Diagnosis not present

## 2022-01-24 DIAGNOSIS — R1013 Epigastric pain: Secondary | ICD-10-CM | POA: Diagnosis not present

## 2022-01-24 DIAGNOSIS — I480 Paroxysmal atrial fibrillation: Secondary | ICD-10-CM | POA: Diagnosis present

## 2022-01-24 DIAGNOSIS — I4819 Other persistent atrial fibrillation: Secondary | ICD-10-CM | POA: Diagnosis not present

## 2022-01-24 DIAGNOSIS — I4811 Longstanding persistent atrial fibrillation: Secondary | ICD-10-CM | POA: Diagnosis not present

## 2022-01-24 DIAGNOSIS — R188 Other ascites: Secondary | ICD-10-CM | POA: Diagnosis not present

## 2022-01-24 DIAGNOSIS — Z743 Need for continuous supervision: Secondary | ICD-10-CM | POA: Diagnosis not present

## 2022-01-24 DIAGNOSIS — K8020 Calculus of gallbladder without cholecystitis without obstruction: Secondary | ICD-10-CM | POA: Diagnosis not present

## 2022-01-24 DIAGNOSIS — R001 Bradycardia, unspecified: Secondary | ICD-10-CM | POA: Diagnosis not present

## 2022-01-24 LAB — CBC WITH DIFFERENTIAL/PLATELET
Abs Immature Granulocytes: 0.03 10*3/uL (ref 0.00–0.07)
Basophils Absolute: 0 10*3/uL (ref 0.0–0.1)
Basophils Relative: 0 %
Eosinophils Absolute: 0.2 10*3/uL (ref 0.0–0.5)
Eosinophils Relative: 2 %
HCT: 36.4 % (ref 36.0–46.0)
Hemoglobin: 12.2 g/dL (ref 12.0–15.0)
Immature Granulocytes: 0 %
Lymphocytes Relative: 20 %
Lymphs Abs: 1.9 10*3/uL (ref 0.7–4.0)
MCH: 33.4 pg (ref 26.0–34.0)
MCHC: 33.5 g/dL (ref 30.0–36.0)
MCV: 99.7 fL (ref 80.0–100.0)
Monocytes Absolute: 0.5 10*3/uL (ref 0.1–1.0)
Monocytes Relative: 5 %
Neutro Abs: 6.6 10*3/uL (ref 1.7–7.7)
Neutrophils Relative %: 73 %
Platelets: 145 10*3/uL — ABNORMAL LOW (ref 150–400)
RBC: 3.65 MIL/uL — ABNORMAL LOW (ref 3.87–5.11)
RDW: 13.4 % (ref 11.5–15.5)
WBC: 9.2 10*3/uL (ref 4.0–10.5)
nRBC: 0 % (ref 0.0–0.2)

## 2022-01-24 LAB — COMPREHENSIVE METABOLIC PANEL
ALT: 79 U/L — ABNORMAL HIGH (ref 0–44)
AST: 146 U/L — ABNORMAL HIGH (ref 15–41)
Albumin: 3.6 g/dL (ref 3.5–5.0)
Alkaline Phosphatase: 82 U/L (ref 38–126)
Anion gap: 14 (ref 5–15)
BUN: 16 mg/dL (ref 8–23)
CO2: 25 mmol/L (ref 22–32)
Calcium: 9.5 mg/dL (ref 8.9–10.3)
Chloride: 105 mmol/L (ref 98–111)
Creatinine, Ser: 1.26 mg/dL — ABNORMAL HIGH (ref 0.44–1.00)
GFR, Estimated: 41 mL/min — ABNORMAL LOW (ref >60)
Glucose, Bld: 114 mg/dL — ABNORMAL HIGH (ref 70–99)
Potassium: 4.1 mmol/L (ref 3.5–5.1)
Sodium: 144 mmol/L (ref 135–145)
Total Bilirubin: 1.2 mg/dL (ref 0.3–1.2)
Total Protein: 5.8 g/dL — ABNORMAL LOW (ref 6.5–8.1)

## 2022-01-24 LAB — TROPONIN I (HIGH SENSITIVITY)
Troponin I (High Sensitivity): 9 ng/L (ref <18)
Troponin I (High Sensitivity): 10 ng/L (ref <18)

## 2022-01-24 LAB — LIPASE, BLOOD: Lipase: 117 U/L — ABNORMAL HIGH (ref 11–51)

## 2022-01-24 LAB — MAGNESIUM: Magnesium: 1.9 mg/dL (ref 1.7–2.4)

## 2022-01-24 MED ORDER — HYDRALAZINE HCL 20 MG/ML IJ SOLN
10.0000 mg | INTRAMUSCULAR | Status: DC | PRN
  Administered 2022-01-24 – 2022-01-27 (×2): 10 mg via INTRAVENOUS
  Filled 2022-01-24 (×2): qty 1

## 2022-01-24 MED ORDER — ACETAMINOPHEN 325 MG PO TABS
650.0000 mg | ORAL_TABLET | Freq: Four times a day (QID) | ORAL | Status: DC | PRN
  Administered 2022-01-24 – 2022-01-26 (×2): 650 mg via ORAL
  Filled 2022-01-24 (×2): qty 2

## 2022-01-24 MED ORDER — ONDANSETRON HCL 4 MG/2ML IJ SOLN
4.0000 mg | Freq: Four times a day (QID) | INTRAMUSCULAR | Status: DC | PRN
  Administered 2022-01-24: 4 mg via INTRAVENOUS
  Filled 2022-01-24: qty 2

## 2022-01-24 MED ORDER — ONDANSETRON 4 MG PO TBDP: 4.0000 mg | ORAL_TABLET | Freq: Four times a day (QID) | ORAL | Status: DC | PRN

## 2022-01-24 MED ORDER — SODIUM CHLORIDE 0.9 % IV SOLN
2.0000 g | Freq: Once | INTRAVENOUS | Status: AC
  Administered 2022-01-24: 2 g via INTRAVENOUS
  Filled 2022-01-24: qty 20

## 2022-01-24 MED ORDER — PIPERACILLIN-TAZOBACTAM 3.375 G IVPB
3.3750 g | Freq: Three times a day (TID) | INTRAVENOUS | Status: DC
  Administered 2022-01-24 – 2022-01-26 (×6): 3.375 g via INTRAVENOUS
  Filled 2022-01-24 (×6): qty 50

## 2022-01-24 MED ORDER — FENTANYL CITRATE PF 50 MCG/ML IJ SOSY
50.0000 ug | PREFILLED_SYRINGE | Freq: Once | INTRAMUSCULAR | Status: AC
  Administered 2022-01-24: 50 ug via INTRAVENOUS
  Filled 2022-01-24: qty 1

## 2022-01-24 MED ORDER — LACTATED RINGERS IV SOLN: INTRAVENOUS | Status: DC

## 2022-01-24 MED ORDER — MORPHINE SULFATE (PF) 2 MG/ML IV SOLN
2.0000 mg | INTRAVENOUS | Status: DC | PRN
  Administered 2022-01-24 – 2022-01-28 (×6): 2 mg via INTRAVENOUS
  Filled 2022-01-24 (×6): qty 1

## 2022-01-24 MED ORDER — ACETAMINOPHEN 650 MG RE SUPP: 650.0000 mg | Freq: Four times a day (QID) | RECTAL | Status: DC | PRN

## 2022-01-24 MED ORDER — METOPROLOL TARTRATE 25 MG PO TABS
25.0000 mg | ORAL_TABLET | Freq: Two times a day (BID) | ORAL | Status: DC
  Administered 2022-01-24 – 2022-01-29 (×7): 25 mg via ORAL
  Filled 2022-01-24 (×11): qty 1

## 2022-01-24 MED ORDER — OXYCODONE HCL 5 MG PO TABS
5.0000 mg | ORAL_TABLET | ORAL | Status: DC | PRN
  Administered 2022-01-24 – 2022-01-25 (×3): 5 mg via ORAL
  Filled 2022-01-24 (×4): qty 1

## 2022-01-24 NOTE — ED Provider Notes (Signed)
[facility] EMERGENCY DEPARTMENT Provider Note   CSN: [id number] Arrival date & time: 01/24/22  0424     History  Chief Complaint  Patient presents with   Atrial Fibrillation    [name] is a 88 y.o. female.  88 year old female who presents the ER today with a couple different complaints.  Patient "did not feel well " all throughout the day yesterday.  Last night started having some right upper quadrant pain radiating up towards her right shoulder.  Had episode of emesis.  Called EMS.  No gallbladder history.  No fevers. No dyspnea. EMS was called. Stated she would have heart rates in the 40s but then when she got here at the bridge she had episode of pretty severe pain and nausea and her heart rate got down to the 30s.  Tracing at bedside and does appear to be in the 30s.  Recovered and her heart rates have been normal since then.  When her son arrived he said that she is intermittently taking medication as she does not always take, she supposed to.  He doubts that she takes extra if anything she does not take enough.   Atrial Fibrillation       Home Medications Prior to Admission medications   Medication Sig Start Date End Date Taking? Authorizing Provider  acetaminophen (TYLENOL) 650 MG CR tablet Take 1 tablet (650 mg total) by mouth 2 (two) times daily. 12/25/21   Lauree Chandler, NP  apixaban (ELIQUIS) 5 MG TABS tablet Take 1 tablet (5 mg total) by mouth 2 (two) times daily. 07/15/21   Lorretta Harp, MD  Ascorbic Acid (VITAMIN C) 100 MG tablet Take 100 mg by mouth daily.    [provider]  atorvastatin (LIPITOR) 40 MG tablet Take 1 tablet (40 mg total) by mouth daily. 06/26/21   Ngetich, [name] C, NP  Calcium Citrate-Vitamin D (CITRACAL + D PO) Take 1 tablet by mouth 2 (two) times daily.     [provider]  diclofenac Sodium (VOLTAREN) 1 % GEL Apply 4 g topically 4 (four) times daily as needed. 12/25/21   Lauree Chandler, NP   Magnesium Hydroxide (PHILLIPS MILK OF MAGNESIA PO) Take 15 mL by mouth. Take daily at 6 o'clock at night    [provider]  metoprolol tartrate (LOPRESSOR) 25 MG tablet Take 1 tablet (25 mg total) by mouth 2 (two) times daily. 07/30/21   Lorretta Harp, MD  mirabegron ER (MYRBETRIQ) 50 MG TB24 tablet Take 1 tablet (50 mg total) by mouth daily. 06/26/21   Ngetich, [name] C, NP  valsartan (DIOVAN) 160 MG tablet Take 1 tablet (160 mg total) by mouth in the morning. 06/26/21   Ngetich, [name] C, NP  valsartan (DIOVAN) 80 MG tablet Take 1 tablet (80 mg total) by mouth every evening. 06/26/21   Ngetich, [name] C, NP      Allergies    Lisinopril, Ceftriaxone, and Codeine    Review of Systems   Review of Systems  Physical Exam Updated Vital Signs BP (!) 180/82   Pulse 64   Temp 98.1 F (36.7 C)   Resp 19   SpO2 98%  Physical Exam Vitals and nursing note reviewed.  Constitutional:      Appearance: She is well-developed.  HENT:     Head: Normocephalic and atraumatic.  Eyes:     Pupils: Pupils are equal, round, and reactive to light.  Cardiovascular:     Rate and Rhythm:  Normal rate and regular rhythm.     Heart sounds: Murmur heard.  Pulmonary:     Effort: No respiratory distress.     Breath sounds: No stridor.  Abdominal:     General: Abdomen is flat. There is no distension.     Tenderness: There is abdominal tenderness (Right upper quadrant). There is no guarding.  Musculoskeletal:        General: No swelling or tenderness. Normal range of motion.     Cervical back: Normal range of motion.  Skin:    General: Skin is warm and dry.  Neurological:     Mental Status: She is alert.     ED Results / Procedures / Treatments   Labs (all labs ordered are listed, but only abnormal results are displayed) Labs Reviewed  CBC WITH DIFFERENTIAL/PLATELET - Abnormal; Notable for the following components:      Result Value   RBC 3.65 (*)    Platelets 145 (*)    All other  components within normal limits  COMPREHENSIVE METABOLIC PANEL - Abnormal; Notable for the following components:   Glucose, Bld 114 (*)    Creatinine, Ser 1.26 (*)    Total Protein 5.8 (*)    AST 146 (*)    ALT 79 (*)    GFR, Estimated 41 (*)    All other components within normal limits  LIPASE, BLOOD - Abnormal; Notable for the following components:   Lipase 117 (*)    All other components within normal limits  MAGNESIUM  BRAIN NATRIURETIC PEPTIDE  TROPONIN I (HIGH SENSITIVITY)  TROPONIN I (HIGH SENSITIVITY)    EKG None  Radiology DG Chest Portable 1 View  Result Date: 01/24/2022 CLINICAL DATA:  88 year old female with bradycardia, chest and abdominal pain. EXAM: PORTABLE CHEST 1 VIEW COMPARISON:  Chest radiographs 06/24/2020 and earlier. FINDINGS: Portable AP view at 0453 hours. Chronic cardiomegaly, cardiac valve replacement. Stable lung volumes and mediastinal contours when allowing for portable technique. Stable pulmonary vascularity, no overt edema. However, evidence of a small new right pleural effusion at the costophrenic angle. No pneumothorax or consolidation. No acute osseous abnormality identified. Paucity of bowel gas in the upper abdomen. Calcified aortic atherosclerosis. IMPRESSION: 1. Evidence of a small right pleural effusion, new from last year. No overt edema. 2. Chronic cardiomegaly.  Aortic Atherosclerosis (ICD10-I70.0). Electronically Signed   By: Genevie Ann M.D.   On: 01/24/2022 05:30    Procedures Procedures    Medications Ordered in ED Medications  fentaNYL (SUBLIMAZE) injection 50 mcg (has no administration in time range)    ED Course/ Medical Decision Making/ A&P                           Medical Decision Making Amount and/or Complexity of Data Reviewed Labs: ordered. Radiology: ordered. ECG/medicine tests: ordered.  Risk Prescription drug management. Decision regarding hospitalization.  Suspect GI causes for chest pain.  Could be biliary colic  causing her to vagal and have the bradycardia at the bridge.  She has normal heart rate now.  Her pain is in the right upper quadrant.  Her lipase is elevated along with her LFTs so possibly choledocholithiasis.  We will get an ultrasound.  Rest of her work-up is reassuring at this time.  Pain meds provided.  NPO.  X-ray viewed by myself and showed small right pleural effusion but no other obvious abnormalities. Care transferred pending [place] results and disposition.    Final Clinical Impression(s) /  ED Diagnoses Final diagnoses:  None    Rx / DC Orders ED Discharge Orders     None         Zohan Shiflet, Corene Cornea, MD 01/24/22 2314

## 2022-01-24 NOTE — Consult Note (Signed)
Cardiology Consultation:   Patient ID: [name] MRN: [id number]; DOB: 06/25/1931  Admit date: 01/24/2022 Date of Consult: 01/24/2022  Primary Care Provider: Lauree Chandler, NP Primary Cardiologist: None  Primary Electrophysiologist:  None    Patient Profile:   [name] is a 88 y.o. female with a hx of breast cancer, COPD, HTN, HLD, afib, prior CVA, AS s/p aortic valve replacement with bioprosthetic valve who is being seen today for the evaluation of preoperative cardiac risk evaluation at the request of hospitalist and surgical teams.  History of Present Illness:   [name] is a 88 y.o. female with a hx of breast cancer, COPD, HTN, HLD, afib, prior CVA, AS s/p aortic valve replacement with bioprosthetic valve who presents with chest and abdominal pain and found to have cholecystitis. Given her cardiac history, surgical team asked for preoperative cardiac recommendations and risk evaluation. Patient difficult to understand, son at bedside to provide history.  She had surgical valve replacement in 2014. At that time had normal coronaries on cardiac cath (age 88)   Has chronic afib on apixban for anticoagulation. CHADS2Vasc score of 6 (age, sex, HTN, prior CVA). GFR 41   Troponin negative x 2   She walks some around the house. Partially dependent on iADLs. Walks to the mailbox without major symptoms. Son thinks she's slowing down some but it has been progressive   Past Medical History:  Diagnosis Date   Aortic stenosis 07/20/2012   Aortic stenosis    Arthritis    Cancer (HCC)    Breast   Carotid bruit 08/06/2008   Doppler - R ECA demonstrates noarrowing w/ elevated velocities consistent w/ >70% diameter reduction; R and L ICAs show no evidence of diameter reduction, significant tortuosity or vascular abnormality;    COPD (chronic obstructive pulmonary disease) (HCC)    Hearing loss    Heart murmur    Hyperlipidemia    Hypertension    dr Gwenlyn Found    Osteoporosis    PAF (paroxysmal atrial fibrillation) ([place]) 09/04/2012   PVD (peripheral vascular disease) ([place]) 08/13/2010   R/P MV - normal pattern of perfusion in all regions, EF 76%; no significant wall abnormalities noted; normal perfusion study   Right carotid bruit    high-grade right external carotid artery stenosis by 2 parts ultrasound 2 years ago   S/P aortic valve replacement with bioprosthetic valve 08/30/2012   21 mm [facility] Ease bovine pericardial tissue valve   Stroke (cerebrum) [facility])     Past Surgical History:  Procedure Laterality Date   ABDOMINAL HYSTERECTOMY     Partial   AORTIC VALVE REPLACEMENT N/A 08/30/2012   Procedure: AORTIC VALVE REPLACEMENT (AVR);  Surgeon: Rexene Alberts, MD;  Location: [place];  Service: Open Heart Surgery;  Laterality: N/A;   BIOPSY SHOULDER Left 10/08/2005   shave biopsy   BREAST LUMPECTOMY  02/25/1997   right   [place] Left 1997   Breast implant   EYE SURGERY  1997   Cataract surgery   INTRAOPERATIVE TRANSESOPHAGEAL ECHOCARDIOGRAM N/A 08/30/2012   Procedure: INTRAOPERATIVE TRANSESOPHAGEAL ECHOCARDIOGRAM;  Surgeon: Rexene Alberts, MD;  Location: [employer];  Service: Open Heart Surgery;  Laterality: N/A;   LEFT AND RIGHT HEART CATHETERIZATION WITH CORONARY ANGIOGRAM N/A 08/14/2012   Procedure: LEFT AND RIGHT HEART CATHETERIZATION WITH CORONARY ANGIOGRAM;  Surgeon: Lorretta Harp, MD;  Location: [facility] C[facility] LAB;  Service: Cardiovascular;  Laterality: N/A;  LEFT HEART CATH  08/14/12   Nl cors, AS   MASTECTOMY  09/29/95   left   PARTIAL HYSTERECTOMY     TOTAL HIP ARTHROPLASTY  09/2010     Home Medications:  Prior to Admission medications   Medication Sig Start Date End Date Taking? Authorizing Provider  acetaminophen (TYLENOL) 650 MG CR tablet Take 1 tablet (650 mg total) by mouth 2 (two) times daily. 12/25/21  Yes Lauree Chandler, NP  apixaban (ELIQUIS) 5 MG TABS tablet Take 1  tablet (5 mg total) by mouth 2 (two) times daily. 07/15/21  Yes Lorretta Harp, MD  Ascorbic Acid (VITAMIN C) 100 MG tablet Take 100 mg by mouth daily.   Yes [provider]  atorvastatin (LIPITOR) 40 MG tablet Take 1 tablet (40 mg total) by mouth daily. 06/26/21  Yes Ngetich, [name] C, NP  Calcium Citrate-Vitamin D (CITRACAL + D PO) Take 1 tablet by mouth daily.   Yes [provider]  diclofenac Sodium (VOLTAREN) 1 % GEL Apply 4 g topically 4 (four) times daily as needed. Patient taking differently: Apply 4 g topically 4 (four) times daily as needed (leg and knee pain from arthritis). 12/25/21  Yes Lauree Chandler, NP  Magnesium Hydroxide (PHILLIPS MILK OF MAGNESIA PO) Take 15 mLs by mouth at bedtime. Take daily at 6 o'clock at night   Yes [provider]  metoprolol tartrate (LOPRESSOR) 25 MG tablet Take 1 tablet (25 mg total) by mouth 2 (two) times daily. 07/30/21  Yes Lorretta Harp, MD  mirabegron ER (MYRBETRIQ) 50 MG TB24 tablet Take 1 tablet (50 mg total) by mouth daily. 06/26/21  Yes Ngetich, [name] C, NP  valsartan (DIOVAN) 160 MG tablet Take 1 tablet (160 mg total) by mouth in the morning. 06/26/21  Yes Ngetich, [name] C, NP  valsartan (DIOVAN) 80 MG tablet Take 1 tablet (80 mg total) by mouth every evening. 06/26/21  Yes Ngetich, [name] C, NP    Inpatient Medications: Scheduled Meds:  metoprolol tartrate  25 mg Oral BID   Continuous Infusions:  lactated ringers 75 mL/hr at 01/24/22 1114   piperacillin-tazobactam (ZOSYN)  IV Stopped (01/24/22 1403)   PRN Meds: acetaminophen **OR** acetaminophen, hydrALAZINE, morphine injection, ondansetron **OR** ondansetron (ZOFRAN) IV, oxyCODONE  Allergies:    Allergies  Allergen Reactions   Lisinopril Cough   Ceftriaxone Rash   Codeine Rash    All over the body.    Social History:   Social History   Socioeconomic History   Marital status: Widowed    Spouse name: Not on file   Number of children: 2   Years  of education: 9   Highest education level: Not on file  Occupational History   Occupation: retired  Tobacco Use   Smoking status: Never   Smokeless tobacco: Never  Vaping Use   Vaping Use: Never used  Substance and Sexual Activity   Alcohol use: No   Drug use: No   Sexual activity: Never  Other Topics Concern   Not on file  Social History Narrative   Right handed   One story home   Social Determinants of Health   Financial Resource Strain: Low Risk  (05/23/2017)   Overall Financial Resource Strain (CARDIA)    Difficulty of Paying Living Expenses: Not hard at all  Food Insecurity: No Food Insecurity (05/23/2017)   Hunger Vital Sign    Worried About Running Out of Food in the Last Year: Never true    Ran Out  of Food in the Last Year: Never true  Transportation Needs: No Transportation Needs (05/23/2017)   PRAPARE - Hydrologist (Medical): No    Lack of Transportation (Non-Medical): No  Physical Activity: Insufficiently Active (05/23/2017)   Exercise Vital Sign    Days of Exercise per Week: 7 days    Minutes of Exercise per Session: 10 min  Stress: No Stress Concern Present (05/23/2017)   [place]    Feeling of Stress : Only a little  Social Connections: Somewhat Isolated (05/23/2017)   Social Connection and Isolation Panel [NHANES]    Frequency of Communication with Friends and Family: More than three times a week    Frequency of Social Gatherings with Friends and Family: More than three times a week    Attends Religious Services: More than 4 times per year    Active Member of [employer] or Organizations: No    Attends Archivist Meetings: Never    Marital Status: Widowed  Intimate Partner Violence: Not At Risk (05/23/2017)   Humiliation, Afraid, Rape, and Kick questionnaire    Fear of Current or Ex-Partner: No    Emotionally Abused: No    Physically Abused: No    Sexually  Abused: No    Family History:    Family History  Problem Relation Age of Onset   Cancer Mother        Bladder   Early death Father        Tractor accident   Early death Brother        Radiation protection practitioner   Early death Brother        during heart surgery     Review of Systems: 12 point review of systems negative unless noted above in HPI   Physical Exam/Data:   Vitals:   01/24/22 1800 01/24/22 1815 01/24/22 1830 01/24/22 1845  BP: 113/60 (!) 114/50 (!) 112/55 (!) 111/52  Pulse:    65  Resp: 16 18 (!) 22 (!) 21  Temp:    98.8 F (37.1 C)  TempSrc:    Oral  SpO2:    93%    Intake/Output Summary (Last 24 hours) at 01/24/2022 1945 Last data filed at 01/24/2022 1403 Gross per 24 hour  Intake 147.22 ml  Output --  Net 147.22 ml   There were no vitals filed for this visit. There is no height or weight on file to calculate BMI.  General:  no distress HEENT: normal Lymph: no adenopathy Neck: no JVD Endocrine:  No thryomegaly Vascular: No carotid bruits; FA pulses 2+ bilaterally without bruits  Cardiac:  normal S1, S2; RRR; soft systolic murmur Lungs:  clear to auscultation bilaterally, no wheezing, rhonchi or rales  Abd: soft, nontender, no hepatomegaly  Ext: no edema Musculoskeletal:  No deformities, BUE and BLE strength normal and equal Skin: warm and dry  Neuro:  no focal abnormalities noted Psych:  Normal affect   EKG:  The EKG was personally reviewed and demonstrates:  rate controlled afib Telemetry:  Telemetry was personally reviewed and demonstrates:  afib  Relevant CV Studies: Echo 07/2021: IMPRESSIONS     1. Left ventricular ejection fraction, by estimation, is 60 to 65%. The  left ventricle has normal function. The left ventricle has no regional  wall motion abnormalities. There is mild left ventricular hypertrophy.  Left ventricular diastolic parameters  are indeterminate.   2. Right ventricular systolic function is normal. The right ventricular  size is  normal. There is normal pulmonary artery systolic pressure. The  estimated right ventricular systolic pressure is 37.1 mmHg.   3. Left atrial size was moderately dilated.   4. Right atrial size was mildly dilated.   5. The mitral valve is normal in structure. No evidence of mitral valve  regurgitation. No evidence of mitral stenosis.   6. Tricuspid valve regurgitation is moderate.   7. The aortic valve has been repaired/replaced. Aortic valve  regurgitation is not visualized. There is a 21 mm Edwards bioprosthetic  valve present in the aortic position. Echo findings are consistent with  normal structure and function of the aortic  valve prosthesis. Aortic valve mean gradient measures 11.6 mmHg. Aortic  valve Vmax measures 2.35 m/s.   8. The inferior vena cava is normal in size with greater than 50%  respiratory variability, suggesting right atrial pressure of 3 mmHg.   Laboratory Data:  Chemistry Recent Labs  Lab 01/24/22 0438  NA 144  K 4.1  CL 105  CO2 25  GLUCOSE 114*  BUN 16  CREATININE 1.26*  CALCIUM 9.5  GFRNONAA 41*  ANIONGAP 14    Recent Labs  Lab 01/24/22 0438  PROT 5.8*  ALBUMIN 3.6  AST 146*  ALT 79*  ALKPHOS 82  BILITOT 1.2   Hematology Recent Labs  Lab 01/24/22 0438  WBC 9.2  RBC 3.65*  HGB 12.2  HCT 36.4  MCV 99.7  MCH 33.4  MCHC 33.5  RDW 13.4  PLT 145*   Cardiac EnzymesNo results for input(s): "TROPONINI" in the last 168 hours. No results for input(s): "TROPIPOC" in the last 168 hours.  BNPNo results for input(s): "BNP", "PROBNP" in the last 168 hours.  DDimer No results for input(s): "DDIMER" in the last 168 hours.  Radiology/Studies:  US Abdomen Limited RUQ (LIVER/GB)  Result Date: 01/24/2022 CLINICAL DATA:  Right upper quadrant pain EXAM: ULTRASOUND ABDOMEN LIMITED RIGHT UPPER QUADRANT COMPARISON:  11/22/2017. FINDINGS: Gallbladder: Gallstones are noted measuring up to 1.5 cm. Mild gallbladder wall thickening measures 3.5 mm.  Pericholecystic fluid noted. No sonographic Murphy's sign reported by the sonographer. Common bile duct: Diameter: 3 mm Liver: Increased parenchymal echogenicity. No focal liver lesion. Portal vein is patent on color Doppler imaging with normal direction of blood flow towards the liver. Other: Trace perihepatic ascites. IMPRESSION: 1. Gallstones and gallbladder wall thickening. Pericholecystic fluid noted. No sonographic Murphy's sign reported by the sonographer. Cannot exclude acute cholecystitis. If there is a high clinical suspicion for acute cholecystitis consider further evaluation with HIDA scan. 2. Hepatic steatosis. 3. Trace perihepatic ascites. Electronically Signed   By: Kerby Moors M.D.   On: 01/24/2022 07:47   DG Chest Portable 1 View  Result Date: 01/24/2022 CLINICAL DATA:  88 year old female with bradycardia, chest and abdominal pain. EXAM: PORTABLE CHEST 1 VIEW COMPARISON:  Chest radiographs 06/24/2020 and earlier. FINDINGS: Portable AP view at 0453 hours. Chronic cardiomegaly, cardiac valve replacement. Stable lung volumes and mediastinal contours when allowing for portable technique. Stable pulmonary vascularity, no overt edema. However, evidence of a small new right pleural effusion at the costophrenic angle. No pneumothorax or consolidation. No acute osseous abnormality identified. Paucity of bowel gas in the upper abdomen. Calcified aortic atherosclerosis. IMPRESSION: 1. Evidence of a small right pleural effusion, new from last year. No overt edema. 2. Chronic cardiomegaly.  Aortic Atherosclerosis (ICD10-I70.0). Electronically Signed   By: Genevie Ann M.D.   On: 01/24/2022 05:30    Assessment and Plan:   #Preoperative  Risk Evaluation She is low risk, using the (geriatric sensistive) revised cardiac risk she has a 4% risk of adverse cardiac event perioperatively. She is best optimized at this time and no preoperative workup or imaging is required.   #Afib / Anticoagulation management   CHADS2Vasc 6 - Annual risk of stroke is ~9%, Safe to hold anticoagulation prior to surgery. Recommend restarted as soon as safe from surgical perspective to minimize time off anticoagulation. No heparin bridge needed.       For questions or updates, please contact [place] Please consult [email or web address] for contact info under     Signed, Doyne Keel, MD  01/24/2022 7:45 PM

## 2022-01-24 NOTE — H&P (Signed)
History and Physical    Patient: [name] UYQ:[id number] DOB: Dec 12, 1931 DOA: 01/24/2022 DOS: the patient was seen and examined on 01/24/2022 PCP: Lauree Chandler, NP  Patient coming from: Home - lives with son; Donald Prose: Pandora Leiter, [phone]   Chief Complaint: Chest/abdominal pain  HPI: [name] is a 88 y.o. female with medical history significant of AVR, breast cancer, COPD, HTN, HLD, afib, and CVA presenting with chest/abdominal pain.   She started hurting on her R side, better now since the pain medication.  Her BP was high, HR in 30s.  Her right side and chest were hurting.  Pain started Friday night and worsened through Saturday.  +nausea.    ER Course:   RUQ pain, elevated LFTs and lipase.  RUQ [place] with stones, wall thickening.  Pain resolved with fentanyl so negative Murphy's sign.  Surgery recommends HIDA, abx, and Eliquis washout.     Review of Systems: As mentioned in the history of present illness. All other systems reviewed and are negative. Past Medical History:  Diagnosis Date   Aortic stenosis 07/20/2012   Aortic stenosis    Arthritis    Cancer (HCC)    Breast   Carotid bruit 08/06/2008   Doppler - R ECA demonstrates noarrowing w/ elevated velocities consistent w/ >70% diameter reduction; R and L ICAs show no evidence of diameter reduction, significant tortuosity or vascular abnormality;    COPD (chronic obstructive pulmonary disease) (HCC)    Hearing loss    Heart murmur    Hyperlipidemia    Hypertension    dr Gwenlyn Found   Osteoporosis    PAF (paroxysmal atrial fibrillation) ([place]) 09/04/2012   PVD (peripheral vascular disease) ([place]) 08/13/2010   R/P MV - normal pattern of perfusion in all regions, EF 76%; no significant wall abnormalities noted; normal perfusion study   Right carotid bruit    high-grade right external carotid artery stenosis by 2 parts ultrasound 2 years ago   S/P aortic valve replacement with bioprosthetic valve 08/30/2012   21 mm  [facility] Ease bovine pericardial tissue valve   Stroke (cerebrum) [facility])    Past Surgical History:  Procedure Laterality Date   ABDOMINAL HYSTERECTOMY     Partial   AORTIC VALVE REPLACEMENT N/A 08/30/2012   Procedure: AORTIC VALVE REPLACEMENT (AVR);  Surgeon: Rexene Alberts, MD;  Location: [place];  Service: Open Heart Surgery;  Laterality: N/A;   BIOPSY SHOULDER Left 10/08/2005   shave biopsy   BREAST LUMPECTOMY  02/25/1997   right   Boling Left 1997   Breast implant   EYE SURGERY  1997   Cataract surgery   INTRAOPERATIVE TRANSESOPHAGEAL ECHOCARDIOGRAM N/A 08/30/2012   Procedure: INTRAOPERATIVE TRANSESOPHAGEAL ECHOCARDIOGRAM;  Surgeon: Rexene Alberts, MD;  Location: [place];  Service: Open Heart Surgery;  Laterality: N/A;   LEFT AND RIGHT HEART CATHETERIZATION WITH CORONARY ANGIOGRAM N/A 08/14/2012   Procedure: LEFT AND RIGHT HEART CATHETERIZATION WITH CORONARY ANGIOGRAM;  Surgeon: Lorretta Harp, MD;  Location: [facility] CATH LAB;  Service: Cardiovascular;  Laterality: N/A;   LEFT HEART CATH  08/14/12   Nl cors, AS   MASTECTOMY  09/29/95   left   PARTIAL HYSTERECTOMY     TOTAL HIP ARTHROPLASTY  09/2010   Social History:  reports that she has never smoked. She has never used smokeless tobacco. She reports that she does not drink alcohol and does not use drugs.  Allergies  Allergen Reactions   Lisinopril Cough   Ceftriaxone Rash   Codeine Rash    All over the body.    Family History  Problem Relation Age of Onset   Cancer Mother        Bladder   Early death Father        Tractor accident   Early death Brother        Radiation protection practitioner   Early death Brother        during heart surgery    Prior to Admission medications   Medication Sig Start Date End Date Taking? Authorizing Provider  acetaminophen (TYLENOL) 650 MG CR tablet Take 1 tablet (650 mg total) by mouth 2 (two) times daily. 12/25/21  Yes Lauree Chandler, NP   apixaban (ELIQUIS) 5 MG TABS tablet Take 1 tablet (5 mg total) by mouth 2 (two) times daily. 07/15/21  Yes Lorretta Harp, MD  Ascorbic Acid (VITAMIN C) 100 MG tablet Take 100 mg by mouth daily.   Yes [provider]  atorvastatin (LIPITOR) 40 MG tablet Take 1 tablet (40 mg total) by mouth daily. 06/26/21  Yes Ngetich, [name] C, NP  Calcium Citrate-Vitamin D (CITRACAL + D PO) Take 1 tablet by mouth daily.   Yes [provider]  diclofenac Sodium (VOLTAREN) 1 % GEL Apply 4 g topically 4 (four) times daily as needed. Patient taking differently: Apply 4 g topically 4 (four) times daily as needed (leg and knee pain from arthritis). 12/25/21  Yes Lauree Chandler, NP  Magnesium Hydroxide (PHILLIPS MILK OF MAGNESIA PO) Take 15 mLs by mouth at bedtime. Take daily at 6 o'clock at night   Yes [provider]  metoprolol tartrate (LOPRESSOR) 25 MG tablet Take 1 tablet (25 mg total) by mouth 2 (two) times daily. 07/30/21  Yes Lorretta Harp, MD  mirabegron ER (MYRBETRIQ) 50 MG TB24 tablet Take 1 tablet (50 mg total) by mouth daily. 06/26/21  Yes Ngetich, [name] C, NP  valsartan (DIOVAN) 160 MG tablet Take 1 tablet (160 mg total) by mouth in the morning. 06/26/21  Yes Ngetich, [name] C, NP  valsartan (DIOVAN) 80 MG tablet Take 1 tablet (80 mg total) by mouth every evening. 06/26/21  Yes Ngetich, Nelda Bucks, NP    Physical Exam: Vitals:   01/24/22 1630 01/24/22 1645 01/24/22 1700 01/24/22 1715  BP: (!) 102/53 (!) 103/59 117/64 131/73  Pulse:      Resp: '14 14 15 17  '$ Temp:      TempSrc:      SpO2:       General:  Appears calm and comfortable and is in NAD Eyes:  PERRL, EOMI, normal lids, iris ENT:  hard of hearing, lips & tongue, mmm Neck:  no LAD, masses or thyromegaly Cardiovascular:  RRR, no m/r/g. No LE edema.  Respiratory:   CTA bilaterally with no wheezes/rales/rhonchi.  Normal respiratory effort. Abdomen:  soft, TTP mostly in RUQ and epigastric region, ND Skin:  no rash  or induration seen on limited exam Musculoskeletal:  grossly normal tone BUE/BLE, good ROM, no bony abnormality Psychiatric:  grossly normal mood and affect, speech fluent and appropriate, AOx3 Neurologic:  CN 2-12 grossly intact, moves all extremities in coordinated fashion   Radiological Exams on Admission: Independently reviewed - see discussion in A/P where applicable  US Abdomen Limited RUQ (LIVER/GB)  Result Date: 01/24/2022 CLINICAL DATA:  Right upper quadrant pain EXAM: ULTRASOUND ABDOMEN LIMITED RIGHT UPPER QUADRANT COMPARISON:  11/22/2017. FINDINGS: Gallbladder: Gallstones  are noted measuring up to 1.5 cm. Mild gallbladder wall thickening measures 3.5 mm. Pericholecystic fluid noted. No sonographic Murphy's sign reported by the sonographer. Common bile duct: Diameter: 3 mm Liver: Increased parenchymal echogenicity. No focal liver lesion. Portal vein is patent on color Doppler imaging with normal direction of blood flow towards the liver. Other: Trace perihepatic ascites. IMPRESSION: 1. Gallstones and gallbladder wall thickening. Pericholecystic fluid noted. No sonographic Murphy's sign reported by the sonographer. Cannot exclude acute cholecystitis. If there is a high clinical suspicion for acute cholecystitis consider further evaluation with HIDA scan. 2. Hepatic steatosis. 3. Trace perihepatic ascites. Electronically Signed   By: Kerby Moors M.D.   On: 01/24/2022 07:47   DG Chest Portable 1 View  Result Date: 01/24/2022 CLINICAL DATA:  88 year old female with bradycardia, chest and abdominal pain. EXAM: PORTABLE CHEST 1 VIEW COMPARISON:  Chest radiographs 06/24/2020 and earlier. FINDINGS: Portable AP view at 0453 hours. Chronic cardiomegaly, cardiac valve replacement. Stable lung volumes and mediastinal contours when allowing for portable technique. Stable pulmonary vascularity, no overt edema. However, evidence of a small new right pleural effusion at the costophrenic angle. No  pneumothorax or consolidation. No acute osseous abnormality identified. Paucity of bowel gas in the upper abdomen. Calcified aortic atherosclerosis. IMPRESSION: 1. Evidence of a small right pleural effusion, new from last year. No overt edema. 2. Chronic cardiomegaly.  Aortic Atherosclerosis (ICD10-I70.0). Electronically Signed   By: Genevie Ann M.D.   On: 01/24/2022 05:30    EKG: Independently reviewed.  Afib with rate 61; nonspecific ST changes with no evidence of acute ischemia   Labs on Admission: I have personally reviewed the available labs and imaging studies at the time of the admission.  Pertinent labs:    Glucose 114 BUN 16/Creatinine 1.26/GFR 41; 13/1/54 on 10/27 Lipase 117 AST 146/ALT 79 HS troponin 9, 10 Unremarkable CBC   Assessment and Plan: Principal Problem:   Acute cholecystitis Active Problems:   Essential hypertension   Atrial fibrillation (HCC)   CKD (chronic kidney disease) stage 3, GFR 30-59 ml/min (HCC)   CVA (cerebral vascular accident) ([place])   Dyslipidemia    Cholecystitis -Patient with onset worsening RUQ/midepigastric pain -RUQ [place] with gallstones, gallbladder wall thickening, concern for acute cholecystitis -Clear liquids, NPO after midnight -Morphine for pain, Zofran for nausea -Initial plan for HIDA but no longer needed; plan for  cholecystectomy tomorrow (unless not cleared by cardiology) -Empiric coverage with Zosyn for now   Pre-op clearance -No coronary revascularization in the last 5 years or stress testing/cath in the last 2 years; remote aortic valve replacement -Pre-operative EKG testing was done in the ER -The [facility]'s Modified Cardiac Risk Index score is Class 1, with a low cardiac risk -Based on marginal functional capacity, will request cards consult for clearance  Afib -Rate controlled with metoprolol -Hold Eliquis in anticipation of surgery, last dose 11/25 PM -Will monitor overnight on tele but likely able to discontinue  post-operatively  HTN -Continue metoprolol -Hold losartan  HLD -Continue atorvastatin  Stage 3a CKD -Slightly worse than baseline but not frank AKI -Attempt to avoid nephrotoxic medications -Recheck BMP in AM   CVA -No physical deficits but she does have MCI    Advance Care Planning:   Code Status: Full Code   Consults: Surgery  DVT Prophylaxis: SCDs  Family Communication: Son was present throughout evaluation  Severity of Illness: The appropriate patient status for this patient is INPATIENT. Inpatient status is judged to be reasonable and necessary in order  to provide the required intensity of service to ensure the patient's safety. The patient's presenting symptoms, physical exam findings, and initial radiographic and laboratory data in the context of their chronic comorbidities is felt to place them at high risk for further clinical deterioration. Furthermore, it is not anticipated that the patient will be medically stable for discharge from the hospital within 2 midnights of admission.   * I certify that at the point of admission it is my clinical judgment that the patient will require inpatient hospital care spanning beyond 2 midnights from the point of admission due to high intensity of service, high risk for further deterioration and high frequency of surveillance required.*  Author: Karmen Bongo, MD 01/24/2022 5:48 PM  For on call review [email or web address].

## 2022-01-24 NOTE — ED Provider Notes (Signed)
Received patient in turnover from Dr. Dayna Barker.  Please see their note for further details of Hx, PE.  Briefly patient is a 88 y.o. female with a Atrial Fibrillation .  Has RUQ abdominal pain on exam, concern for acute chole.  No leukocytosis, LFT with mild elevation, lipase elevated but less than 3x upper limit.  Awaiting RUQ [place].   RUQ [place] with concerns for gallstones gallbladder wall thickening and pericholecystic fluid.  No obvious Murphy sign.  On my repeat exam patient is feeling a bit better.  She feels like she is pain-free after her 1 dose of pain medicine.  I discussed the case with general surgery, Jana Half, PA recommended HIDA scan medical admission and Eliquis washout and IV antibiotics.    Deno Etienne, DO 01/24/22 [phone]

## 2022-01-24 NOTE — ED Triage Notes (Addendum)
Pt arrived via EMS from home with Afib. Right sided chest pain radiating to arm. EKG showed bradycardia and then came up to 90s then back down. Pt takes many beta blockers and BP meds. Rate been 30-80. C/O nausea. Vagaled with EMS earlier. Reports epigastric pain at this time.  828 systolic BPs for first responders but it has now come down to 160s. Deficits from previous TIA. Uses walker at home. Lives by herself. Her son comes and checks in on her. He is now at bedside.  EMS gave '324mg'$  aspirin per EMS.

## 2022-01-24 NOTE — Consult Note (Signed)
Reason for Consult:ab pain Referring Physician: Dr Cory Munch is an 88 y.o. female.  HPI: 52 yof with mmp including prior AVR, on eliquis (last took last night), PAF, PAD, COPD who presents with 48 hours of worsening ab pain mostly in ruq. Associated with some nausea, no emesis.  Not getting better.  She has undergone evaluation that shows nl wbc, lipase 117, ast 146, alt 79, TB 1.2. US shows gallstones up to 1.5 cm, gbw 3.5 mm, pericholecystic fluid.  There is trace perihepatic ascites.  We were asked to see her  Past Medical History:  Diagnosis Date   Aortic stenosis 07/20/2012   Aortic stenosis    Arthritis    Cancer (HCC)    Breast   Carotid bruit 08/06/2008   Doppler - R ECA demonstrates noarrowing w/ elevated velocities consistent w/ >70% diameter reduction; R and L ICAs show no evidence of diameter reduction, significant tortuosity or vascular abnormality;    COPD (chronic obstructive pulmonary disease) (HCC)    Hearing loss    Heart murmur    Hyperlipidemia    Hypertension    dr Gwenlyn Found   Osteoporosis    PAF (paroxysmal atrial fibrillation) ([employer]) 09/04/2012   PVD (peripheral vascular disease) ([place]) 08/13/2010   R/P MV - normal pattern of perfusion in all regions, EF 76%; no significant wall abnormalities noted; normal perfusion study   Right carotid bruit    high-grade right external carotid artery stenosis by 2 parts ultrasound 2 years ago   S/P aortic valve replacement with bioprosthetic valve 08/30/2012   21 mm [facility] Ease bovine pericardial tissue valve   Stroke (cerebrum) [facility])     Past Surgical History:  Procedure Laterality Date   ABDOMINAL HYSTERECTOMY     Partial   AORTIC VALVE REPLACEMENT N/A 08/30/2012   Procedure: AORTIC VALVE REPLACEMENT (AVR);  Surgeon: Rexene Alberts, MD;  Location: [place];  Service: Open Heart Surgery;  Laterality: N/A;   BIOPSY SHOULDER Left 10/08/2005   shave biopsy   BREAST LUMPECTOMY  02/25/1997   right   [place] Left 1997   Breast implant   EYE SURGERY  1997   Cataract surgery   INTRAOPERATIVE TRANSESOPHAGEAL ECHOCARDIOGRAM N/A 08/30/2012   Procedure: INTRAOPERATIVE TRANSESOPHAGEAL ECHOCARDIOGRAM;  Surgeon: Rexene Alberts, MD;  Location: [place];  Service: Open Heart Surgery;  Laterality: N/A;   LEFT AND RIGHT HEART CATHETERIZATION WITH CORONARY ANGIOGRAM N/A 08/14/2012   Procedure: LEFT AND RIGHT HEART CATHETERIZATION WITH CORONARY ANGIOGRAM;  Surgeon: Lorretta Harp, MD;  Location: [facility] C[facility] LAB;  Service: Cardiovascular;  Laterality: N/A;   LEFT HEART CATH  08/14/12   Nl cors, AS   MASTECTOMY  09/29/95   left   PARTIAL HYSTERECTOMY     TOTAL HIP ARTHROPLASTY  09/2010    Family History  Problem Relation Age of Onset   Cancer Mother        Bladder   Early death Father        Tractor accident   Early death Brother        Radiation protection practitioner   Early death Brother        during heart surgery    Social History:  reports that she has never smoked. She has never used smokeless tobacco. She reports that she does not drink alcohol and does not use drugs.  Allergies:  Allergies  Allergen Reactions   Lisinopril  Cough   Ceftriaxone Rash   Codeine Rash    All over the body.    Current Facility-Administered Medications  Medication Dose Route Frequency Provider Last Rate Last Admin   acetaminophen (TYLENOL) tablet 650 mg  650 mg Oral Q6H PRN Karmen Bongo, MD       Or   acetaminophen (TYLENOL) suppository 650 mg  650 mg Rectal Q6H PRN Karmen Bongo, MD       hydrALAZINE (APRESOLINE) injection 10 mg  10 mg Intravenous Q2H PRN Karmen Bongo, MD       lactated ringers infusion   Intravenous Continuous Karmen Bongo, MD       metoprolol tartrate (LOPRESSOR) tablet 25 mg  25 mg Oral BID Karmen Bongo, MD       morphine (PF) 2 MG/ML injection 2 mg  2 mg Intravenous Q2H PRN Karmen Bongo, MD       ondansetron (ZOFRAN-ODT) disintegrating  tablet 4 mg  4 mg Oral Q6H PRN Karmen Bongo, MD       Or   ondansetron [facility]) injection 4 mg  4 mg Intravenous Q6H PRN Karmen Bongo, MD       oxyCODONE (Oxy IR/ROXICODONE) immediate release tablet 5-10 mg  5-10 mg Oral Q4H PRN Karmen Bongo, MD       piperacillin-tazobactam (ZOSYN) IVPB 3.375 g  3.375 g Intravenous Lynne Logan, MD       Current Outpatient Medications  Medication Sig Dispense Refill   acetaminophen (TYLENOL) 650 MG CR tablet Take 1 tablet (650 mg total) by mouth 2 (two) times daily.     apixaban (ELIQUIS) 5 MG TABS tablet Take 1 tablet (5 mg total) by mouth 2 (two) times daily. 180 tablet 1   Ascorbic Acid (VITAMIN C) 100 MG tablet Take 100 mg by mouth daily.     atorvastatin (LIPITOR) 40 MG tablet Take 1 tablet (40 mg total) by mouth daily. 90 tablet 1   Calcium Citrate-Vitamin D (CITRACAL + D PO) Take 1 tablet by mouth daily.     diclofenac Sodium (VOLTAREN) 1 % GEL Apply 4 g topically 4 (four) times daily as needed. (Patient taking differently: Apply 4 g topically 4 (four) times daily as needed (leg and knee pain from arthritis).) 100 g 1   Magnesium Hydroxide (PHILLIPS MILK OF MAGNESIA PO) Take 15 mLs by mouth at bedtime. Take daily at 6 o'clock at night     metoprolol tartrate (LOPRESSOR) 25 MG tablet Take 1 tablet (25 mg total) by mouth 2 (two) times daily. 180 tablet 3   mirabegron ER (MYRBETRIQ) 50 MG TB24 tablet Take 1 tablet (50 mg total) by mouth daily. 90 tablet 1   valsartan (DIOVAN) 160 MG tablet Take 1 tablet (160 mg total) by mouth in the morning. 90 tablet 1   valsartan (DIOVAN) 80 MG tablet Take 1 tablet (80 mg total) by mouth every evening. 90 tablet 1     Results for orders placed or performed during the hospital encounter of 01/24/22 (from the past 48 hour(s))  CBC with Differential     Status: Abnormal   Collection Time: 01/24/22  4:38 AM  Result Value Ref Range   WBC 9.2 4.0 - 10.5 K/uL   RBC 3.65 (L) 3.87 - 5.11 MIL/uL   Hemoglobin  12.2 12.0 - 15.0 g/dL   HCT 36.4 36.0 - 46.0 %   MCV 99.7 80.0 - 100.0 fL   MCH 33.4 26.0 - 34.0 pg   MCHC 33.5 30.0 - 36.0 g/dL  RDW 13.4 11.5 - 15.5 %   Platelets 145 (L) 150 - 400 K/uL   nRBC 0.0 0.0 - 0.2 %   Neutrophils Relative % 73 %   Neutro Abs 6.6 1.7 - 7.7 K/uL   Lymphocytes Relative 20 %   Lymphs Abs 1.9 0.7 - 4.0 K/uL   Monocytes Relative 5 %   Monocytes Absolute 0.5 0.1 - 1.0 K/uL   Eosinophils Relative 2 %   Eosinophils Absolute 0.2 0.0 - 0.5 K/uL   Basophils Relative 0 %   Basophils Absolute 0.0 0.0 - 0.1 K/uL   Immature Granulocytes 0 %   Abs Immature Granulocytes 0.03 0.00 - 0.07 K/uL    Comment: Performed at [facility] [address] [place], [place] [address]  Comprehensive metabolic panel     Status: Abnormal   Collection Time: 01/24/22  4:38 AM  Result Value Ref Range   Sodium 144 135 - 145 mmol/L   Potassium 4.1 3.5 - 5.1 mmol/L   Chloride 105 98 - 111 mmol/L   CO2 25 22 - 32 mmol/L   Glucose, Bld 114 (H) 70 - 99 mg/dL    Comment: Glucose reference range applies only to samples taken after fasting for at least 8 hours.   BUN 16 8 - 23 mg/dL   Creatinine, Ser 1.26 (H) 0.44 - 1.00 mg/dL   Calcium 9.5 8.9 - 10.3 mg/dL   Total Protein 5.8 (L) 6.5 - 8.1 g/dL   Albumin 3.6 3.5 - 5.0 g/dL   AST 146 (H) 15 - 41 U/L   ALT 79 (H) 0 - 44 U/L   Alkaline Phosphatase 82 38 - 126 U/L   Total Bilirubin 1.2 0.3 - 1.2 mg/dL   GFR, Estimated 41 (L) >60 mL/min    Comment: (NOTE) Calculated using the CKD-EPI Creatinine Equation (2021)    Anion gap 14 5 - 15    Comment: Performed at [facility], [place] [address], [place], [place] [address]  Lipase, blood     Status: Abnormal   Collection Time: 01/24/22  4:38 AM  Result Value Ref Range   Lipase 117 (H) 11 - 51 U/L    Comment: Performed at [facility], [place] [address], [place], [place] [address]  Troponin I (High Sensitivity)     Status: None   Collection Time: 01/24/22  4:38 AM  Result Value  Ref Range   Troponin I (High Sensitivity) 9 <18 ng/L    Comment: (NOTE) Elevated high sensitivity troponin I (hsTnI) values and significant  changes across serial measurements may suggest ACS but many other  chronic and acute conditions are known to elevate hsTnI results.  Refer to the "Links" section for chest pain algorithms and additional  guidance. Performed at [facility], [place] [address], [place], [place] [address]   Magnesium     Status: None   Collection Time: 01/24/22  4:38 AM  Result Value Ref Range   Magnesium 1.9 1.7 - 2.4 mg/dL    Comment: Performed at [facility] [address], [place], [place] [address]  Troponin I (High Sensitivity)     Status: None   Collection Time: 01/24/22  6:38 AM  Result Value Ref Range   Troponin I (High Sensitivity) 10 <18 ng/L    Comment: (NOTE) Elevated high sensitivity troponin I (hsTnI) values and significant  changes across serial measurements may suggest ACS but many other  chronic and acute conditions are known to  elevate hsTnI results.  Refer to the "Links" section for chest pain algorithms and additional  guidance. Performed at [facility], [place] [address], [place], [place] [address]     US Abdomen Limited RUQ (LIVER/GB)  Result Date: 01/24/2022 CLINICAL DATA:  Right upper quadrant pain EXAM: ULTRASOUND ABDOMEN LIMITED RIGHT UPPER QUADRANT COMPARISON:  11/22/2017. FINDINGS: Gallbladder: Gallstones are noted measuring up to 1.5 cm. Mild gallbladder wall thickening measures 3.5 mm. Pericholecystic fluid noted. No sonographic Murphy's sign reported by the sonographer. Common bile duct: Diameter: 3 mm Liver: Increased parenchymal echogenicity. No focal liver lesion. Portal vein is patent on color Doppler imaging with normal direction of blood flow towards the liver. Other: Trace perihepatic ascites. IMPRESSION: 1. Gallstones and gallbladder wall thickening. Pericholecystic fluid noted. No sonographic Murphy's sign  reported by the sonographer. Cannot exclude acute cholecystitis. If there is a high clinical suspicion for acute cholecystitis consider further evaluation with HIDA scan. 2. Hepatic steatosis. 3. Trace perihepatic ascites. Electronically Signed   By: Kerby Moors M.D.   On: 01/24/2022 07:47   DG Chest Portable 1 View  Result Date: 01/24/2022 CLINICAL DATA:  88 year old female with bradycardia, chest and abdominal pain. EXAM: PORTABLE CHEST 1 VIEW COMPARISON:  Chest radiographs 06/24/2020 and earlier. FINDINGS: Portable AP view at 0453 hours. Chronic cardiomegaly, cardiac valve replacement. Stable lung volumes and mediastinal contours when allowing for portable technique. Stable pulmonary vascularity, no overt edema. However, evidence of a small new right pleural effusion at the costophrenic angle. No pneumothorax or consolidation. No acute osseous abnormality identified. Paucity of bowel gas in the upper abdomen. Calcified aortic atherosclerosis. IMPRESSION: 1. Evidence of a small right pleural effusion, new from last year. No overt edema. 2. Chronic cardiomegaly.  Aortic Atherosclerosis (ICD10-I70.0). Electronically Signed   By: Genevie Ann M.D.   On: 01/24/2022 05:30    Review of Systems  Constitutional:  Negative for fever.  Gastrointestinal:  Positive for abdominal pain and nausea. Negative for vomiting.  All other systems reviewed and are negative.  Blood pressure (!) 143/59, pulse 65, temperature 98.1 F (36.7 C), resp. rate 19, SpO2 97 %. Physical Exam Constitutional:      Appearance: She is obese.  Eyes:     General: No scleral icterus. Cardiovascular:     Rate and Rhythm: Normal rate and regular rhythm.  Pulmonary:     Effort: Pulmonary effort is normal.  Chest:    Abdominal:     General: There is no distension.     Palpations: Abdomen is soft.     Tenderness: There is abdominal tenderness in the right upper quadrant. Positive signs include Murphy's sign.     Hernia: No hernia is  present.  Musculoskeletal:     Cervical back: Neck supple.  Skin:    Capillary Refill: Capillary refill takes less than 2 seconds.     Coloration: Skin is not jaundiced.  Neurological:     General: No focal deficit present.     Mental Status: She is alert.     Comments: Difficult to understand   Psychiatric:        Mood and Affect: Mood normal.     Assessment/Plan: Cholecystitis -admission, abx, can have clears, npo after mn -does not need a hida scan, she has cholecystitis on exam -discussed lap chole as definitive treatment once eliquis off longer -will need periop cardiac recs- if risk is too high the consideration of perc chole possible but not ideal treatment- appears she is ok for  surgery given history. -pharm dvt prophylaxis is fine to start   Rolm Bookbinder 01/24/2022, 10:28 AM

## 2022-01-24 NOTE — ED Notes (Signed)
Pt alert, NAD, calm, interactive, resps e/u, speaking in short choppy words/phrases, difficult to understand, baseline speech, son at [facility], clear diet given, denies nausea, IV zosyn and IVF infusing, c/o pain, pain med given ~ 2hrs ago.

## 2022-01-25 ENCOUNTER — Encounter (HOSPITAL_COMMUNITY)
Admission: EM | Disposition: A | Discharge status: Home Health | Payer: Self-pay | Source: Home / Self Care | Attending: Internal Medicine

## 2022-01-25 ENCOUNTER — Inpatient Hospital Stay (HOSPITAL_COMMUNITY): Payer: Medicare Other | Admitting: Certified Registered Nurse Anesthetist

## 2022-01-25 ENCOUNTER — Other Ambulatory Visit: Payer: Self-pay

## 2022-01-25 ENCOUNTER — Encounter (HOSPITAL_COMMUNITY): Payer: Self-pay | Admitting: Internal Medicine

## 2022-01-25 ENCOUNTER — Inpatient Hospital Stay (HOSPITAL_BASED_OUTPATIENT_CLINIC_OR_DEPARTMENT_OTHER): Payer: Medicare Other | Admitting: Certified Registered Nurse Anesthetist

## 2022-01-25 DIAGNOSIS — Z952 Presence of prosthetic heart valve: Secondary | ICD-10-CM | POA: Diagnosis not present

## 2022-01-25 DIAGNOSIS — M1990 Unspecified osteoarthritis, unspecified site: Secondary | ICD-10-CM

## 2022-01-25 DIAGNOSIS — K8010 Calculus of gallbladder with chronic cholecystitis without obstruction: Secondary | ICD-10-CM | POA: Diagnosis not present

## 2022-01-25 DIAGNOSIS — Z01818 Encounter for other preprocedural examination: Secondary | ICD-10-CM

## 2022-01-25 DIAGNOSIS — I4819 Other persistent atrial fibrillation: Secondary | ICD-10-CM

## 2022-01-25 DIAGNOSIS — J449 Chronic obstructive pulmonary disease, unspecified: Secondary | ICD-10-CM

## 2022-01-25 DIAGNOSIS — Z9049 Acquired absence of other specified parts of digestive tract: Secondary | ICD-10-CM

## 2022-01-25 DIAGNOSIS — I10 Essential (primary) hypertension: Secondary | ICD-10-CM | POA: Diagnosis not present

## 2022-01-25 DIAGNOSIS — K8000 Calculus of gallbladder with acute cholecystitis without obstruction: Secondary | ICD-10-CM | POA: Diagnosis not present

## 2022-01-25 DIAGNOSIS — K810 Acute cholecystitis: Secondary | ICD-10-CM | POA: Diagnosis not present

## 2022-01-25 HISTORY — PX: CHOLECYSTECTOMY: SHX55

## 2022-01-25 LAB — COMPREHENSIVE METABOLIC PANEL
ALT: 340 U/L — ABNORMAL HIGH (ref 0–44)
AST: 420 U/L — ABNORMAL HIGH (ref 15–41)
Albumin: 3.2 g/dL — ABNORMAL LOW (ref 3.5–5.0)
Alkaline Phosphatase: 148 U/L — ABNORMAL HIGH (ref 38–126)
Anion gap: 8 (ref 5–15)
BUN: 15 mg/dL (ref 8–23)
CO2: 23 mmol/L (ref 22–32)
Calcium: 8.6 mg/dL — ABNORMAL LOW (ref 8.9–10.3)
Chloride: 107 mmol/L (ref 98–111)
Creatinine, Ser: 1.2 mg/dL — ABNORMAL HIGH (ref 0.44–1.00)
GFR, Estimated: 44 mL/min — ABNORMAL LOW (ref >60)
Glucose, Bld: 94 mg/dL (ref 70–99)
Potassium: 4.5 mmol/L (ref 3.5–5.1)
Sodium: 138 mmol/L (ref 135–145)
Total Bilirubin: 1.2 mg/dL (ref 0.3–1.2)
Total Protein: 5.2 g/dL — ABNORMAL LOW (ref 6.5–8.1)

## 2022-01-25 LAB — CBC
HCT: 32.4 % — ABNORMAL LOW (ref 36.0–46.0)
Hemoglobin: 10.8 g/dL — ABNORMAL LOW (ref 12.0–15.0)
MCH: 33.9 pg (ref 26.0–34.0)
MCHC: 33.3 g/dL (ref 30.0–36.0)
MCV: 101.6 fL — ABNORMAL HIGH (ref 80.0–100.0)
Platelets: 120 10*3/uL — ABNORMAL LOW (ref 150–400)
RBC: 3.19 MIL/uL — ABNORMAL LOW (ref 3.87–5.11)
RDW: 14 % (ref 11.5–15.5)
WBC: 4.8 10*3/uL (ref 4.0–10.5)
nRBC: 0 % (ref 0.0–0.2)

## 2022-01-25 SURGERY — LAPAROSCOPIC CHOLECYSTECTOMY WITH INTRAOPERATIVE CHOLANGIOGRAM
Anesthesia: General

## 2022-01-25 MED ORDER — SUGAMMADEX SODIUM 200 MG/2ML IV SOLN
INTRAVENOUS | Status: DC | PRN
  Administered 2022-01-25: 200 mg via INTRAVENOUS

## 2022-01-25 MED ORDER — LIDOCAINE 2% (20 MG/ML) 5 ML SYRINGE
INTRAMUSCULAR | Status: DC | PRN
  Administered 2022-01-25: 40 mg via INTRAVENOUS

## 2022-01-25 MED ORDER — HYDRALAZINE HCL 20 MG/ML IJ SOLN
INTRAMUSCULAR | Status: AC
  Filled 2022-01-25: qty 1

## 2022-01-25 MED ORDER — ROCURONIUM BROMIDE 10 MG/ML (PF) SYRINGE
PREFILLED_SYRINGE | INTRAVENOUS | Status: DC | PRN
  Administered 2022-01-25: 50 mg via INTRAVENOUS

## 2022-01-25 MED ORDER — 0.9 % SODIUM CHLORIDE (POUR BTL) OPTIME
TOPICAL | Status: DC | PRN
  Administered 2022-01-25: 1000 mL

## 2022-01-25 MED ORDER — HALOPERIDOL LACTATE 5 MG/ML IJ SOLN
5.0000 mg | Freq: Once | INTRAMUSCULAR | Status: DC | PRN
  Filled 2022-01-25 (×2): qty 1

## 2022-01-25 MED ORDER — PHENYLEPHRINE 80 MCG/ML (10ML) SYRINGE FOR IV PUSH (FOR BLOOD PRESSURE SUPPORT)
PREFILLED_SYRINGE | INTRAVENOUS | Status: DC | PRN
  Administered 2022-01-25: 160 ug via INTRAVENOUS

## 2022-01-25 MED ORDER — ONDANSETRON 4 MG PO TBDP: 4.0000 mg | ORAL_TABLET | Freq: Four times a day (QID) | ORAL | Status: DC | PRN

## 2022-01-25 MED ORDER — PROPOFOL 10 MG/ML IV BOLUS
INTRAVENOUS | Status: DC | PRN
  Administered 2022-01-25: 50 mg via INTRAVENOUS

## 2022-01-25 MED ORDER — LIDOCAINE 2% (20 MG/ML) 5 ML SYRINGE
INTRAMUSCULAR | Status: AC
  Filled 2022-01-25: qty 5

## 2022-01-25 MED ORDER — INDOCYANINE GREEN 25 MG IV SOLR
INTRAVENOUS | Status: DC | PRN
  Administered 2022-01-25: 5 mg via INTRAVENOUS

## 2022-01-25 MED ORDER — ORAL CARE MOUTH RINSE: 15.0000 mL | Freq: Once | OROMUCOSAL | Status: AC

## 2022-01-25 MED ORDER — ACETAMINOPHEN 500 MG PO TABS
1000.0000 mg | ORAL_TABLET | Freq: Once | ORAL | Status: AC
  Administered 2022-01-25: 1000 mg via ORAL
  Filled 2022-01-25: qty 2

## 2022-01-25 MED ORDER — DEXAMETHASONE SODIUM PHOSPHATE 10 MG/ML IJ SOLN
INTRAMUSCULAR | Status: AC
  Filled 2022-01-25: qty 1

## 2022-01-25 MED ORDER — DEXAMETHASONE SODIUM PHOSPHATE 10 MG/ML IJ SOLN
INTRAMUSCULAR | Status: DC | PRN
  Administered 2022-01-25: 8 mg via INTRAVENOUS

## 2022-01-25 MED ORDER — TRAMADOL HCL 50 MG PO TABS
50.0000 mg | ORAL_TABLET | Freq: Four times a day (QID) | ORAL | Status: DC | PRN
  Filled 2022-01-25: qty 1

## 2022-01-25 MED ORDER — SODIUM CHLORIDE 0.9 % IR SOLN
Status: DC | PRN
  Administered 2022-01-25: 1000 mL

## 2022-01-25 MED ORDER — BUPIVACAINE-EPINEPHRINE (PF) 0.25% -1:200000 IJ SOLN
INTRAMUSCULAR | Status: AC
  Filled 2022-01-25: qty 30

## 2022-01-25 MED ORDER — PHENYLEPHRINE HCL-NACL 20-0.9 MG/250ML-% IV SOLN
INTRAVENOUS | Status: DC | PRN
  Administered 2022-01-25: 25 ug/min via INTRAVENOUS

## 2022-01-25 MED ORDER — PROPOFOL 10 MG/ML IV BOLUS
INTRAVENOUS | Status: AC
  Filled 2022-01-25: qty 20

## 2022-01-25 MED ORDER — ONDANSETRON HCL 4 MG/2ML IJ SOLN
INTRAMUSCULAR | Status: AC
  Filled 2022-01-25: qty 2

## 2022-01-25 MED ORDER — ROCURONIUM BROMIDE 10 MG/ML (PF) SYRINGE
PREFILLED_SYRINGE | INTRAVENOUS | Status: AC
  Filled 2022-01-25: qty 10

## 2022-01-25 MED ORDER — CHLORHEXIDINE GLUCONATE 0.12 % MT SOLN
OROMUCOSAL | Status: AC
  Administered 2022-01-25: 15 mL via OROMUCOSAL
  Filled 2022-01-25: qty 15

## 2022-01-25 MED ORDER — IRBESARTAN 150 MG PO TABS
150.0000 mg | ORAL_TABLET | Freq: Every day | ORAL | Status: DC
  Administered 2022-01-25 – 2022-01-29 (×3): 150 mg via ORAL
  Filled 2022-01-25 (×6): qty 1

## 2022-01-25 MED ORDER — FENTANYL CITRATE (PF) 100 MCG/2ML IJ SOLN
INTRAMUSCULAR | Status: AC
  Filled 2022-01-25: qty 2

## 2022-01-25 MED ORDER — DEXTROSE-NACL 5-0.9 % IV SOLN: INTRAVENOUS | Status: DC

## 2022-01-25 MED ORDER — BUPIVACAINE-EPINEPHRINE (PF) 0.25% -1:200000 IJ SOLN
INTRAMUSCULAR | Status: DC | PRN
  Administered 2022-01-25: 5 mL

## 2022-01-25 MED ORDER — CHLORHEXIDINE GLUCONATE 0.12 % MT SOLN: 15.0000 mL | Freq: Once | OROMUCOSAL | Status: AC

## 2022-01-25 MED ORDER — LACTATED RINGERS IV SOLN: INTRAVENOUS | Status: DC

## 2022-01-25 MED ORDER — DEXMEDETOMIDINE HCL IN NACL 80 MCG/20ML IV SOLN
INTRAVENOUS | Status: AC
  Filled 2022-01-25: qty 20

## 2022-01-25 MED ORDER — ONDANSETRON HCL 4 MG/2ML IJ SOLN
INTRAMUSCULAR | Status: DC | PRN
  Administered 2022-01-25: 4 mg via INTRAVENOUS

## 2022-01-25 MED ORDER — FENTANYL CITRATE (PF) 250 MCG/5ML IJ SOLN
INTRAMUSCULAR | Status: DC | PRN
  Administered 2022-01-25: 75 ug via INTRAVENOUS
  Administered 2022-01-25: 25 ug via INTRAVENOUS
  Administered 2022-01-25 (×2): 50 ug via INTRAVENOUS

## 2022-01-25 MED ORDER — HYDRALAZINE HCL 20 MG/ML IJ SOLN
10.0000 mg | Freq: Once | INTRAMUSCULAR | Status: AC
  Administered 2022-01-25: 10 mg via INTRAVENOUS

## 2022-01-25 MED ORDER — FENTANYL CITRATE (PF) 100 MCG/2ML IJ SOLN
25.0000 ug | INTRAMUSCULAR | Status: DC | PRN
  Administered 2022-01-25 (×2): 50 ug via INTRAVENOUS

## 2022-01-25 MED ORDER — ONDANSETRON HCL 4 MG/2ML IJ SOLN
4.0000 mg | Freq: Four times a day (QID) | INTRAMUSCULAR | Status: DC | PRN
  Filled 2022-01-25: qty 2

## 2022-01-25 MED ORDER — FENTANYL CITRATE (PF) 250 MCG/5ML IJ SOLN
INTRAMUSCULAR | Status: AC
  Filled 2022-01-25: qty 5

## 2022-01-25 SURGICAL SUPPLY — 56 items
ADH SKN CLS APL DERMABOND .7 (GAUZE/BANDAGES/DRESSINGS) ×1
APL PRP STRL LF DISP 70% ISPRP (MISCELLANEOUS) ×1
APPLIER CLIP 5 13 M/L LIGAMAX5 (MISCELLANEOUS)
APR CLP MED LRG 5 ANG JAW (MISCELLANEOUS)
BAG COUNTER SPONGE SURGICOUNT (BAG) ×1 IMPLANT
BAG SPEC RTRVL 10 TROC 200 (ENDOMECHANICALS) ×1
BAG SPNG CNTER NS LX DISP (BAG) ×1
CANISTER SUCT 3000ML PPV (MISCELLANEOUS) ×1 IMPLANT
CHLORAPREP W/TINT 26 (MISCELLANEOUS) ×1 IMPLANT
CLIP APPLIE 5 13 M/L LIGAMAX5 (MISCELLANEOUS) IMPLANT
CLIP LIGATING HEMO O LOK GREEN (MISCELLANEOUS) ×1 IMPLANT
COVER MAYO STAND STRL (DRAPES) ×1 IMPLANT
COVER SURGICAL LIGHT HANDLE (MISCELLANEOUS) ×1 IMPLANT
COVER TRANSDUCER ULTRASND (DRAPES) ×1 IMPLANT
DERMABOND ADVANCED .7 DNX12 (GAUZE/BANDAGES/DRESSINGS) ×1 IMPLANT
DRAPE C-ARM 42X120 X-RAY (DRAPES) ×1 IMPLANT
ELECT REM PT RETURN 9FT ADLT (ELECTROSURGICAL) ×1
ELECTRODE REM PT RTRN 9FT ADLT (ELECTROSURGICAL) ×1 IMPLANT
ENDOLOOP SUT PDS II  0 18 (SUTURE) ×1
ENDOLOOP SUT PDS II 0 18 (SUTURE) IMPLANT
GLOVE BIO SURGEON STRL SZ7.5 (GLOVE) ×1 IMPLANT
GLOVE SURG SYN 7.5  E (GLOVE) ×1
GLOVE SURG SYN 7.5 E (GLOVE) ×1 IMPLANT
GLOVE SURG SYN 7.5 PF PI (GLOVE) ×1 IMPLANT
GOWN STRL REUS W/ TWL LRG LVL3 (GOWN DISPOSABLE) ×2 IMPLANT
GOWN STRL REUS W/ TWL XL LVL3 (GOWN DISPOSABLE) ×1 IMPLANT
GOWN STRL REUS W/TWL LRG LVL3 (GOWN DISPOSABLE) ×2
GOWN STRL REUS W/TWL XL LVL3 (GOWN DISPOSABLE) ×1
GRASPER SUT TROCAR 14GX15 (MISCELLANEOUS) ×1 IMPLANT
IV CATH 14GX2 1/4 (CATHETERS) ×1 IMPLANT
KIT BASIN OR (CUSTOM PROCEDURE TRAY) ×1 IMPLANT
KIT TURNOVER KIT B (KITS) ×1 IMPLANT
NDL INSUFFLATION 14GA 120MM (NEEDLE) ×1 IMPLANT
NEEDLE INSUFFLATION 14GA 120MM (NEEDLE) ×1 IMPLANT
NS IRRIG 1000ML POUR BTL (IV SOLUTION) ×1 IMPLANT
PAD ARMBOARD 7.5X6 YLW CONV (MISCELLANEOUS) ×2 IMPLANT
POUCH LAPAROSCOPIC INSTRUMENT (MISCELLANEOUS) ×1 IMPLANT
POUCH RETRIEVAL ECOSAC 10 (ENDOMECHANICALS) IMPLANT
POUCH RETRIEVAL ECOSAC 10MM (ENDOMECHANICALS) ×1
SCISSORS LAP 5X35 DISP (ENDOMECHANICALS) ×1 IMPLANT
SET CHOLANGIOGRAPHY FRANKLIN (SET/KITS/TRAYS/PACK) ×1 IMPLANT
SET IRRIG TUBING LAPAROSCOPIC (IRRIGATION / IRRIGATOR) ×1 IMPLANT
SET TUBE SMOKE EVAC HIGH FLOW (TUBING) ×1 IMPLANT
SLEEVE ENDOPATH XCEL 5M (ENDOMECHANICALS) ×1 IMPLANT
SLEEVE Z-THREAD 5X100MM (TROCAR) IMPLANT
SPECIMEN JAR SMALL (MISCELLANEOUS) ×1 IMPLANT
STOPCOCK 4 WAY LG BORE MALE ST (IV SETS) ×1 IMPLANT
SUT MNCRL AB 4-0 PS2 18 (SUTURE) ×1 IMPLANT
TOWEL GREEN STERILE (TOWEL DISPOSABLE) ×1 IMPLANT
TOWEL GREEN STERILE FF (TOWEL DISPOSABLE) ×1 IMPLANT
TRAY LAPAROSCOPIC MC (CUSTOM PROCEDURE TRAY) ×1 IMPLANT
TROCAR XCEL NON-BLD 11X100MML (ENDOMECHANICALS) ×1 IMPLANT
TROCAR Z-THREAD BLADED 11X100M (TROCAR) IMPLANT
TROCAR Z-THREAD OPTICAL 5X100M (TROCAR) ×1 IMPLANT
WARMER LAPAROSCOPE (MISCELLANEOUS) ×1 IMPLANT
WATER STERILE IRR 1000ML POUR (IV SOLUTION) ×1 IMPLANT

## 2022-01-25 NOTE — Progress Notes (Signed)
TRH night cross cover note:   I was notified by RN that this patient has become progressively confused,  agitated over the last few hours, pulling at lines and attempting to get out of bed, with these behaviors refractory to attempts at verbal redirection.  In the setting of associated interference with ongoing medical treatment posing potential harm to themself, I have placed order for a single dose of IV Haldol prn for agitation, with potential for soft bilateral wrist restraints if persistence of potential harm to self in spite of these measures.     Babs Bertin, DO Hospitalist

## 2022-01-25 NOTE — Progress Notes (Signed)
PROGRESS NOTE    [name]  PPI:[id number] DOB: 1931/12/07 DOA: 01/24/2022 PCP: Lauree Chandler, NP      Brief Narrative:   Presented with abdominal pain, found to have acute cholecystitis, now s/p laparoscopic cholecystectomy 11/27.   Assessment & Plan:   Principal Problem:   Acute cholecystitis Active Problems:   Essential hypertension   S/P aortic valve replacement with bioprosthetic valve   Atrial fibrillation (HCC)   CKD (chronic kidney disease) stage 3, GFR 30-59 ml/min (HCC)   CVA (cerebral vascular accident) ([place])   Dyslipidemia  # Cholecystitis Clinical findings of such and ruq u/s findings consistent. Now s/p laparoscopic cholecystectomy earlier today. LFTs elevated. Lives at home with son helping out. - will continue zosyn, can probably d/c tomorrow - pt/ot - pain control, bowel regimen - resume home apixaban when gen surg gives the go ahead  # Delirium 2/2 acute illness.  - delirium precautions - haldol prn  # A-fib Rate controlled - cont metop - holding apixaban as above  # HTN Today elevated, most recent bp wnl - cont home metoprolol - start irbesartan for home losartan - hold statin given lft elevation  # CKD 3a Cr 1.2 is at baseline - monitor  # Hx CVA - meds as above    DVT prophylaxis: SCDs Code Status: full Family Communication: son mark updated telephonically 11/27  Level of care: Telemetry Medical Status is: Inpatient Remains inpatient appropriate because: severity of illness    Consultants:  Gen surg,   Procedures: Lap cholecystectomy 11/27  Antimicrobials:  zosyn    Subjective: Sleeping post-surgery  Objective: Vitals:   01/25/22 1430 01/25/22 1445 01/25/22 1450 01/25/22 1500  BP: (!) 211/81 (!) 190/145  (!) 144/98  Pulse: 66 (!) 135 (!) 117 (!) 103  Resp: 17 19 (!) 21   Temp:      TempSrc:      SpO2: 99% 99% 94% 98%    Intake/Output Summary (Last 24 hours) at 01/25/2022 1518 Last data filed  at 01/25/2022 1405 Gross per 24 hour  Intake 1735.07 ml  Output 10 ml  Net 1725.07 ml   There were no vitals filed for this visit.  Examination:  General exam: asleep Respiratory system: Clear to auscultation. Respiratory effort normal. Cardiovascular system: S1 & S2 heard, irreg irreg. No JVD, murmurs, rubs, gallops or clicks. No pedal edema. Gastrointestinal system: surgical incisions glued Central nervous system: asleep but rouses when examined Extremities: Symmetric 5 x 5 power. Skin: No rashes, lesions or ulcers Psychiatry: confused    Data Reviewed: I have personally reviewed following labs and imaging studies  CBC: Recent Labs  Lab 01/24/22 0438 01/25/22 0415  WBC 9.2 4.8  NEUTROABS 6.6  --   HGB 12.2 10.8*  HCT 36.4 32.4*  MCV 99.7 101.6*  PLT 145* 063*   Basic Metabolic Panel: Recent Labs  Lab 01/24/22 0438 01/25/22 0415  NA 144 138  K 4.1 4.5  CL 105 107  CO2 25 23  GLUCOSE 114* 94  BUN 16 15  CREATININE 1.26* 1.20*  CALCIUM 9.5 8.6*  MG 1.9  --    GFR: CrCl cannot be calculated (Unknown ideal weight.). Liver Function Tests: Recent Labs  Lab 01/24/22 0438 01/25/22 0415  AST 146* 420*  ALT 79* 340*  ALKPHOS 82 148*  BILITOT 1.2 1.2  PROT 5.8* 5.2*  ALBUMIN 3.6 3.2*   Recent Labs  Lab 01/24/22 0438  LIPASE 117*   No results for input(s): "AMMONIA" in the  last 168 hours. Coagulation Profile: No results for input(s): "INR", "PROTIME" in the last 168 hours. Cardiac Enzymes: No results for input(s): "CKTOTAL", "CKMB", "CKMBINDEX", "TROPONINI" in the last 168 hours. BNP (last 3 results) No results for input(s): "PROBNP" in the last 8760 hours. HbA1C: No results for input(s): "HGBA1C" in the last 72 hours. CBG: No results for input(s): "GLUCAP" in the last 168 hours. Lipid Profile: No results for input(s): "CHOL", "HDL", "LDLCALC", "TRIG", "CHOLHDL", "LDLDIRECT" in the last 72 hours. Thyroid Function Tests: No results for input(s):  "TSH", "T4TOTAL", "FREET4", "T3FREE", "THYROIDAB" in the last 72 hours. Anemia Panel: No results for input(s): "VITAMINB12", "FOLATE", "FERRITIN", "TIBC", "IRON", "RETICCTPCT" in the last 72 hours. Urine analysis:    Component Value Date/Time   COLORURINE YELLOW 06/23/2020 1923   APPEARANCEUR CLEAR 06/23/2020 1923   LABSPEC 1.011 06/23/2020 [place] 7.0 06/23/2020 1923   GLUCOSEU NEGATIVE 06/23/2020 [place] NEGATIVE 06/23/2020 [place] NEGATIVE 06/23/2020 [place] NEGATIVE 06/23/2020 1923   PROTEINUR NEGATIVE 06/23/2020 1923   UROBILINOGEN 0.2 08/28/2012 1523   NITRITE NEGATIVE 06/23/2020 1923   LEUKOCYTESUR LARGE (A) 06/23/2020 1923   Sepsis Labs: '@LABRCNTIP'$ (procalcitonin:4,lacticidven:4)  )No results found for this or any previous visit (from the past 240 hour(s)).       Radiology Studies: US Abdomen Limited RUQ (LIVER/GB)  Result Date: 01/24/2022 CLINICAL DATA:  Right upper quadrant pain EXAM: ULTRASOUND ABDOMEN LIMITED RIGHT UPPER QUADRANT COMPARISON:  11/22/2017. FINDINGS: Gallbladder: Gallstones are noted measuring up to 1.5 cm. Mild gallbladder wall thickening measures 3.5 mm. Pericholecystic fluid noted. No sonographic Murphy's sign reported by the sonographer. Common bile duct: Diameter: 3 mm Liver: Increased parenchymal echogenicity. No focal liver lesion. Portal vein is patent on color Doppler imaging with normal direction of blood flow towards the liver. Other: Trace perihepatic ascites. IMPRESSION: 1. Gallstones and gallbladder wall thickening. Pericholecystic fluid noted. No sonographic Murphy's sign reported by the sonographer. Cannot exclude acute cholecystitis. If there is a high clinical suspicion for acute cholecystitis consider further evaluation with HIDA scan. 2. Hepatic steatosis. 3. Trace perihepatic ascites. Electronically Signed   By: Kerby Moors M.D.   On: 01/24/2022 07:47   DG Chest Portable 1 View  Result Date:  01/24/2022 CLINICAL DATA:  88 year old female with bradycardia, chest and abdominal pain. EXAM: PORTABLE CHEST 1 VIEW COMPARISON:  Chest radiographs 06/24/2020 and earlier. FINDINGS: Portable AP view at 0453 hours. Chronic cardiomegaly, cardiac valve replacement. Stable lung volumes and mediastinal contours when allowing for portable technique. Stable pulmonary vascularity, no overt edema. However, evidence of a small new right pleural effusion at the costophrenic angle. No pneumothorax or consolidation. No acute osseous abnormality identified. Paucity of bowel gas in the upper abdomen. Calcified aortic atherosclerosis. IMPRESSION: 1. Evidence of a small right pleural effusion, new from last year. No overt edema. 2. Chronic cardiomegaly.  Aortic Atherosclerosis (ICD10-I70.0). Electronically Signed   By: Genevie Ann M.D.   On: 01/24/2022 05:30        Scheduled Meds:  fentaNYL       hydrALAZINE       [MAR Hold] metoprolol tartrate  25 mg Oral BID   Continuous Infusions:  lactated ringers 75 mL/hr at 01/24/22 2217   lactated ringers     [MAR Hold] piperacillin-tazobactam (ZOSYN)  IV 0 g (01/25/22 0935)     LOS: 1 day     Desma Maxim, MD [facility]   If 7PM-7AM, please contact night-coverage [email or web address] Password [email or web address]  01/25/2022, 3:18 PM

## 2022-01-25 NOTE — Transfer of Care (Signed)
Immediate Anesthesia Transfer of Care Note  Patient: [name]  Procedure(s) Performed: LAPAROSCOPIC CHOLECYSTECTOMY  Patient Location: PACU  Anesthesia Type:General  Level of Consciousness: awake  Airway & Oxygen Therapy: Patient Spontanous Breathing and Patient connected to nasal cannula oxygen  Post-op Assessment: Report given to RN and Post -op Vital signs reviewed and stable  Post vital signs: Reviewed and stable  Last Vitals:  Vitals Value Taken Time  BP 194/94 01/25/22 1420  Temp 36.8 C 01/25/22 1420  Pulse 65 01/25/22 1426  Resp 26 01/25/22 1426  SpO2 99 % 01/25/22 1426  Vitals shown include unvalidated device data.  Last Pain:  Vitals:   01/25/22 1226  TempSrc: Oral  PainSc: 0-No pain         Complications: No notable events documented.

## 2022-01-25 NOTE — Progress Notes (Signed)
Progress Note     Subjective: Having significant abdominal pain this am and received pain medications just prior to my visit. She is drowsy and oriented but confused per her son who is bedside.   Objective: Vital signs in last 24 hours: Temp:  [97.6 F (36.4 C)-99.2 F (37.3 C)] 99.2 F (37.3 C) (11/27 0922) Pulse Rate:  [52-88] 75 (11/27 0914) Resp:  [12-22] 12 (11/27 0900) BP: (86-180)/(43-82) 130/68 (11/27 0914) SpO2:  [88 %-99 %] 94 % (11/27 0900) Last BM Date : 01/23/22  Intake/Output from previous day: 11/26 0701 - 11/27 0700 In: 976 [I.V.:828.8; IV Piggyback:147.2] Out: -  Intake/Output this shift: Total I/O In: 56.3 [IV Piggyback:56.3] Out: -   PE: General: pleasant, WD, female who is laying in bed in NAD Abd: soft, ND, TTP RUQ and epigastrium MSK: all 4 extremities are symmetrical with no cyanosis, clubbing, or edema. Skin: warm and dry Neuro: Cranial nerves 2-12 grossly intact, sensation is normal throughout Psych: A&Ox3, drowsy   Lab Results:  Recent Labs    01/24/22 0438 01/25/22 0415  WBC 9.2 4.8  HGB 12.2 10.8*  HCT 36.4 32.4*  PLT 145* 120*   BMET Recent Labs    01/24/22 0438 01/25/22 0415  NA 144 138  K 4.1 4.5  CL 105 107  CO2 25 23  GLUCOSE 114* 94  BUN 16 15  CREATININE 1.26* 1.20*  CALCIUM 9.5 8.6*   PT/INR No results for input(s): "LABPROT", "INR" in the last 72 hours. CMP     Component Value Date/Time   NA 138 01/25/2022 0415   NA 140 02/25/2020 1459   K 4.5 01/25/2022 0415   CL 107 01/25/2022 0415   CO2 23 01/25/2022 0415   GLUCOSE 94 01/25/2022 0415   BUN 15 01/25/2022 0415   BUN 13 02/25/2020 1459   CREATININE 1.20 (H) 01/25/2022 0415   CREATININE 1.00 (H) 12/25/2021 1437   CALCIUM 8.6 (L) 01/25/2022 0415   PROT 5.2 (L) 01/25/2022 0415   PROT 6.0 12/02/2014 0945   ALBUMIN 3.2 (L) 01/25/2022 0415   ALBUMIN 3.9 12/02/2014 0945   AST 420 (H) 01/25/2022 0415   ALT 340 (H) 01/25/2022 0415   ALKPHOS 148 (H)  01/25/2022 0415   BILITOT 1.2 01/25/2022 0415   BILITOT 0.3 12/02/2014 0945   GFRNONAA 44 (L) 01/25/2022 0415   GFRNONAA 50 (L) 11/12/2019 1557   GFRAA 53 (L) 02/25/2020 1459   GFRAA 58 (L) 11/12/2019 1557   Lipase     Component Value Date/Time   LIPASE 117 (H) 01/24/2022 0438       Studies/Results: US Abdomen Limited RUQ (LIVER/GB)  Result Date: 01/24/2022 CLINICAL DATA:  Right upper quadrant pain EXAM: ULTRASOUND ABDOMEN LIMITED RIGHT UPPER QUADRANT COMPARISON:  11/22/2017. FINDINGS: Gallbladder: Gallstones are noted measuring up to 1.5 cm. Mild gallbladder wall thickening measures 3.5 mm. Pericholecystic fluid noted. No sonographic Murphy's sign reported by the sonographer. Common bile duct: Diameter: 3 mm Liver: Increased parenchymal echogenicity. No focal liver lesion. Portal vein is patent on color Doppler imaging with normal direction of blood flow towards the liver. Other: Trace perihepatic ascites. IMPRESSION: 1. Gallstones and gallbladder wall thickening. Pericholecystic fluid noted. No sonographic Murphy's sign reported by the sonographer. Cannot exclude acute cholecystitis. If there is a high clinical suspicion for acute cholecystitis consider further evaluation with HIDA scan. 2. Hepatic steatosis. 3. Trace perihepatic ascites. Electronically Signed   By: Kerby Moors M.D.   On: 01/24/2022 07:47   DG Chest  Portable 1 View  Result Date: 01/24/2022 CLINICAL DATA:  88 year old female with bradycardia, chest and abdominal pain. EXAM: PORTABLE CHEST 1 VIEW COMPARISON:  Chest radiographs 06/24/2020 and earlier. FINDINGS: Portable AP view at 0453 hours. Chronic cardiomegaly, cardiac valve replacement. Stable lung volumes and mediastinal contours when allowing for portable technique. Stable pulmonary vascularity, no overt edema. However, evidence of a small new right pleural effusion at the costophrenic angle. No pneumothorax or consolidation. No acute osseous abnormality identified.  Paucity of bowel gas in the upper abdomen. Calcified aortic atherosclerosis. IMPRESSION: 1. Evidence of a small right pleural effusion, new from last year. No overt edema. 2. Chronic cardiomegaly.  Aortic Atherosclerosis (ICD10-I70.0). Electronically Signed   By: Genevie Ann M.D.   On: 01/24/2022 05:30    Anti-infectives: Anti-infectives (From admission, onward)    Start     Dose/Rate Route Frequency Ordered Stop   01/24/22 1015  piperacillin-tazobactam (ZOSYN) IVPB 3.375 g        3.375 g 12.5 mL/hr over 240 Minutes Intravenous Every 8 hours 01/24/22 1005 01/31/22 1359   01/24/22 0830  cefTRIAXone (ROCEPHIN) 2 g in sodium chloride 0.9 % 100 mL IVPB        2 g 200 mL/hr over 30 Minutes Intravenous  Once 01/24/22 2248 01/24/22 2500        Assessment/Plan  Cholecystitis - cardiology has evaluated this am and cleared for OR - plan for laparoscopic cholecystectomy today and will follow postop  I have explained the procedure, risks, and aftercare of cholecystectomy.  Risks include but are not limited to bleeding, infection, wound problems, anesthesia, diarrhea, bile leak, injury to common bile duct/liver/intestine.  He/she seems to understand and agrees to proceed.    FEN: NPO ID: [email or web address] VTE: eliquis held    LOS: 1 day   Winferd Humphrey, [facility] 01/25/2022, 11:58 AM Please see Amion for pager number during day hours 7:00am-4:30pm

## 2022-01-25 NOTE — Op Note (Signed)
01/25/2022  2:04 PM  PATIENT:  [name]  88 y.o. female  PRE-OPERATIVE DIAGNOSIS:  Cholecystitis  POST-OPERATIVE DIAGNOSIS: Acute Cholecystitis, cholelithiasis  PROCEDURE:  Procedure(s): LAPAROSCOPIC CHOLECYSTECTOMY (N/A)  SURGEON:  Surgeon(s) and Role:    Ralene Ok, MD - Primary  ASSISTANTS: Ewell Poe, RNFA   ANESTHESIA:   local and general  EBL:  10 mL   BLOOD ADMINISTERED:none  DRAINS: none   LOCAL MEDICATIONS USED:  BUPIVICAINE   SPECIMEN:  Source of Specimen:  gallbladder  DISPOSITION OF SPECIMEN:  N/A  COUNTS:  YES  TOURNIQUET:  * No tourniquets in log *  DICTATION: .Dragon Dictation   EBL: <1OX   Complications: none   Counts: reported as correct x 2   Findings:acute inflammation of the gallbladder, gallstones  Indications for procedure: Pt is a 45F with RUQ pain and seen to have gallstones and acute cholecystitis  Details of the procedure: The patient was taken to the operating and placed in the supine position with bilateral SCDs in place. A time out was called and all facts were verified. A pneumoperitoneum was obtained via A Veress needle technique to a pressure of 78m of mercury. A 572mtrochar was then placed in the right upper quadrant under visualization, and there were no injuries to any abdominal organs. A 11 mm port was then placed in the umbilical region after infiltrating with local anesthesia under direct visualization. A second epigastric port was placed under direct visualization.   The gallbladder was identified and retracted, the peritoneum was then sharply dissected from the gallbladder and this dissection was carried down to Calot's triangle. The cystic duct was identified and dissected circumferentially and seen going into the gallbladder 360.  The cystic artery was dissected away from the surrounding tissues.   The critical angle was obtained.   2 clips were placed proximally one distally and the cystic duct  transected. The cystic artery was identified and 2 clips placed proximally and one distally and transected. We then proceeded to remove the gallbladder off the hepatic fossa with Bovie cautery. A retrieval bag was then placed in the abdomen and gallbladder placed in the bag. The hepatic fossa was then reexamined and hemostasis was achieved with Bovie cautery and was excellent at this portion of the case. The subhepatic fossa and perihepatic fossa was then irrigated until the effluent was clear. The specimen bag and specimen were removed from the abdominal cavity.  The 11 mm trocar fascia was reapproximated with the Endo Close #1 Vicryl x3. The pneumoperitoneum was evacuated and all trochars removed under direct visulalization. The skin was then closed with 4-0 Monocryl and the skin dressed with Dermabond. The patient was awaken from general anesthesia and taken to the recovery room in stable condition.    PLAN OF CARE: Admit for overnight observation  PATIENT DISPOSITION:  PACU - hemodynamically stable.   Delay start of Pharmacological VTE agent (>24hrs) due to surgical blood loss or risk of bleeding: not applicable

## 2022-01-25 NOTE — ED Notes (Signed)
Shift report received, assumed care of patient at this time 

## 2022-01-25 NOTE — Discharge Instructions (Signed)
[facility]  Please arrive at least 30 min before your appointment to complete your check in paperwork.  If you are unable to arrive 30 min prior to your appointment time we may have to cancel or reschedule you. LAPAROSCOPIC SURGERY: POST OP INSTRUCTIONS Always review your discharge instruction sheet given to you by the facility where your surgery was performed. IF YOU HAVE DISABILITY OR FAMILY LEAVE FORMS, YOU MUST BRING THEM TO THE OFFICE FOR PROCESSING.   DO NOT GIVE THEM TO YOUR DOCTOR.  PAIN CONTROL  First take acetaminophen (Tylenol) AND/or ibuprofen (Advil) to control your pain after surgery.  Follow directions on package.  Taking acetaminophen (Tylenol) and/or ibuprofen (Advil) regularly after surgery will help to control your pain and lower the amount of prescription pain medication you may need.  You should not take more than 4,000 mg (4 grams) of acetaminophen (Tylenol) in 24 hours.  You should not take ibuprofen (Advil), aleve, motrin, naprosyn or other NSAIDS if you have a history of stomach ulcers or chronic kidney disease.  A prescription for pain medication may be given to you upon discharge.  Take your pain medication as prescribed, if you still have uncontrolled pain after taking acetaminophen (Tylenol) or ibuprofen (Advil). Use ice packs to help control pain. If you need a refill on your pain medication, please contact your pharmacy.  They will contact our office to request authorization. Prescriptions will not be filled after 5pm or on week-ends.  HOME MEDICATIONS Take your usually prescribed medications unless otherwise directed.  DIET You should follow a light diet the first few days after arrival home.  Be sure to include lots of fluids daily. Avoid fatty, fried foods.   CONSTIPATION It is common to experience some constipation after surgery and if you are taking pain medication.  Increasing fluid intake and taking a stool softener (such as Colace)  will usually help or prevent this problem from occurring.  A mild laxative (Milk of Magnesia or Miralax) should be taken according to package instructions if there are no bowel movements after 48 hours.  WOUND/INCISION CARE Most patients will experience some swelling and bruising in the area of the incisions.  Ice packs will help.  Swelling and bruising can take several days to resolve.  Unless discharge instructions indicate otherwise, follow guidelines below  STERI-STRIPS - you may remove your outer bandages 48 hours after surgery, and you may shower at that time.  You have steri-strips (small skin tapes) in place directly over the incision.  These strips should be left on the skin for 7-10 days.   DERMABOND/SKIN GLUE - you may shower in 24 hours.  The glue will flake off over the next 2-3 weeks. Any sutures or staples will be removed at the office during your follow-up visit.  ACTIVITIES You may resume regular (light) daily activities beginning the next day--such as daily self-care, walking, climbing stairs--gradually increasing activities as tolerated.  You may have sexual intercourse when it is comfortable.  Refrain from any heavy lifting or straining until approved by your doctor. You may drive when you are no longer taking prescription pain medication, you can comfortably wear a seatbelt, and you can safely maneuver your car and apply brakes.  FOLLOW-UP You should see your doctor in the office for a follow-up appointment approximately 2-3 weeks after your surgery.  You should have been given your post-op/follow-up appointment when your surgery was scheduled.  If you did not receive a post-op/follow-up appointment, make sure   that you call for this appointment within a day or two after you arrive home to insure a convenient appointment time.   WHEN TO CALL YOUR DOCTOR: Fever over 101.0 Inability to urinate Continued bleeding from incision. Increased pain, redness, or drainage from the  incision. Increasing abdominal pain  The clinic staff is available to answer your questions during regular business hours.  Please don't hesitate to call and ask to speak to one of the nurses for clinical concerns.  If you have a medical emergency, go to the nearest emergency room or call 911.  A surgeon from [facility] is always on call at the hospital. [address], [address], [place], [place]  [address] ? [address] [address], [place], [place]   [address] [phone] ? [phone] ? FAX [phone]  

## 2022-01-25 NOTE — Anesthesia Procedure Notes (Signed)
Procedure Name: Intubation Date/Time: 01/25/2022 1:24 PM  Performed by: Inda Coke, CRNAPre-anesthesia Checklist: Patient identified, Emergency Drugs available, Suction available, Timeout performed and Patient being monitored Patient Re-evaluated:Patient Re-evaluated prior to induction Oxygen Delivery Method: Circle system utilized Preoxygenation: Pre-oxygenation with 100% oxygen Induction Type: IV induction Ventilation: Mask ventilation without difficulty Laryngoscope Size: Mac and 3 Grade View: Grade I Tube type: Oral Tube size: 7.5 mm Number of attempts: 1 Airway Equipment and Method: Stylet Placement Confirmation: ETT inserted through vocal cords under direct vision, positive ETCO2, CO2 detector and breath sounds checked- equal and bilateral Secured at: 23 cm Tube secured with: Tape Dental Injury: Teeth and Oropharynx as per pre-operative assessment

## 2022-01-25 NOTE — Anesthesia Postprocedure Evaluation (Signed)
Anesthesia Post Note  Patient: [name]  Procedure(s) Performed: LAPAROSCOPIC CHOLECYSTECTOMY     Patient location during evaluation: PACU Anesthesia Type: General Level of consciousness: awake and alert Pain management: pain level controlled Vital Signs Assessment: post-procedure vital signs reviewed and stable Respiratory status: spontaneous breathing, nonlabored ventilation, respiratory function stable and patient connected to nasal cannula oxygen Cardiovascular status: blood pressure returned to baseline and stable Postop Assessment: no apparent nausea or vomiting Anesthetic complications: no   No notable events documented.  Last Vitals:  Vitals:   01/25/22 1500 01/25/22 1515  BP: (!) 144/98 (!) 101/48  Pulse: (!) 103 88  Resp:  (!) 22  Temp:  36.8 C  SpO2: 98% 97%    Last Pain:  Vitals:   01/25/22 1226  TempSrc: Oral  PainSc: 0-No pain                 Santa Lighter

## 2022-01-25 NOTE — Progress Notes (Signed)
Patient transported to 6N room 1, VS stable, son at bedside, receiving RN at bedside, no questions/concerns from receiving RN or son.  Rowe Pavy, RN

## 2022-01-25 NOTE — Anesthesia Preprocedure Evaluation (Addendum)
Anesthesia Evaluation  Patient identified by MRN, date of birth, ID band Patient awake    Reviewed: Allergy & Precautions, NPO status , Patient's Chart, lab work & pertinent test results, reviewed documented beta blocker date and time   Airway Mallampati: III  TM Distance: >3 FB Neck ROM: Full    Dental no notable dental hx. (+) Teeth Intact, Dental Advisory Given   Pulmonary asthma , COPD,  COPD inhaler   Pulmonary exam normal breath sounds clear to auscultation       Cardiovascular hypertension, Pt. on home beta blockers and Pt. on medications + Peripheral Vascular Disease and + DOE  Normal cardiovascular exam+ dysrhythmias Atrial Fibrillation + Valvular Problems/Murmurs (s/p AVR 2014) AS  Rhythm:Regular Rate:Normal  TTE 2023  1. Left ventricular ejection fraction, by estimation, is 60 to 65%. The left ventricle has normal function. The left ventricle has no regional wall motion abnormalities. There is mild left ventricular hypertrophy. Left ventricular diastolic parameters are indeterminate.  2. Right ventricular systolic function is normal. The right ventricular size is normal. There is normal pulmonary artery systolic pressure. The estimated right ventricular systolic pressure is 10.9 mmHg.  3. Left atrial size was moderately dilated.  4. Right atrial size was mildly dilated.  5. The mitral valve is normal in structure. No evidence of mitral valve regurgitation. No evidence of mitral stenosis.  6. Tricuspid valve regurgitation is moderate.  7. The aortic valve has been repaired/replaced. Aortic valve regurgitation is not visualized. There is a 21 mm Edwards bioprosthetic valve present in the aortic position. Echo findings are consistent with normal structure and function of the aortic valve prosthesis. Aortic valve mean gradient measures 11.6 mmHg. Aortic valve Vmax measures 2.35 m/s.  8. The inferior vena cava is normal  in size with greater than 50% respiratory variability, suggesting right atrial pressure of 3 mmHg.     Neuro/Psych  PSYCHIATRIC DISORDERS     Dementia CVA, No Residual Symptoms    GI/Hepatic Neg liver ROS,GERD  ,,  Endo/Other  negative endocrine ROS    Renal/GU negative Renal ROS  negative genitourinary   Musculoskeletal  (+) Arthritis ,    Abdominal   Peds  Hematology  (+) Blood dyscrasia (eliquis)   Anesthesia Other Findings   Reproductive/Obstetrics                             Anesthesia Physical Anesthesia Plan  ASA: 3  Anesthesia Plan: General   Post-op Pain Management: Tylenol PO (pre-op)*   Induction: Intravenous  PONV Risk Score and Plan: 3 and Dexamethasone, Ondansetron and Treatment may vary due to age or medical condition  Airway Management Planned: Oral ETT  Additional Equipment:   Intra-op Plan:   Post-operative Plan: Extubation in OR  Informed Consent: I have reviewed the patients History and Physical, chart, labs and discussed the procedure including the risks, benefits and alternatives for the proposed anesthesia with the patient or authorized representative who has indicated his/her understanding and acceptance.     Dental advisory given  Plan Discussed with: CRNA  Anesthesia Plan Comments:        Anesthesia Quick Evaluation

## 2022-01-25 NOTE — ED Notes (Signed)
Patient becoming increasingly confused. This RN tried to redirect and keep her in the bed another RN came to help and patient kicked the other RN and would not stay in the bed. Patient sitting in a chair in room in view of RN. RN paged MD.

## 2022-01-25 NOTE — Progress Notes (Addendum)
Rounding Note    Patient Name: [name] Date of Encounter: 01/25/2022  [place] Cardiologist: None   Subjective   Sleeping, c/o back pain from the bed. Family at the bedside.   Inpatient Medications    Scheduled Meds:  metoprolol tartrate  25 mg Oral BID   Continuous Infusions:  lactated ringers 75 mL/hr at 01/24/22 2217   piperacillin-tazobactam (ZOSYN)  IV 3.375 g (01/25/22 0520)   PRN Meds: acetaminophen **OR** acetaminophen, haloperidol lactate, hydrALAZINE, morphine injection, ondansetron **OR** ondansetron (ZOFRAN) IV, oxyCODONE   Vital Signs    Vitals:   01/25/22 0400 01/25/22 0423 01/25/22 0500 01/25/22 0600  BP: (!) 110/53 (!) 147/75 (!) 142/66 129/63  Pulse: (!) 58 (!) 53 60 (!) 55  Resp: _0 Temp:  97.6 F (36.4 C)    TempSrc:  Oral    SpO2: 95% 99% 93% 94%    Intake/Output Summary (Last 24 hours) at 01/25/2022 0824 Last data filed at 01/24/2022 2217 Gross per 24 hour  Intake 975.97 ml  Output --  Net 975.97 ml      01/06/2022    2:22 PM 12/25/2021    1:57 PM 07/15/2021    3:19 PM  Last 3 Weights  Weight (lbs) 155 lb 2 oz 157 lb 11.2 oz 154 lb 6.4 oz  Weight (kg) 70.364 kg 71.532 kg 70.035 kg      Telemetry    Afib, rates 40-50 - Personally Reviewed  ECG    No new tracing  Physical Exam   GEN: No acute distress.   Neck: No JVD Cardiac: Irreg Irreg, + systolic murmur RUSB, no rubs, or gallops.  Respiratory: Clear to auscultation bilaterally. GI: Soft, nontender, non-distended  MS: No edema; No deformity. Neuro:  Nonfocal  Psych: Normal affect   Labs    High Sensitivity Troponin:   Recent Labs  Lab 01/24/22 0438 01/24/22 0638  TROPONINIHS 9 10     Chemistry Recent Labs  Lab 01/24/22 0438 01/25/22 0415  NA 144 138  K 4.1 4.5  CL 105 107  CO2 25 23  GLUCOSE 114* 94  BUN 16 15  CREATININE 1.26* 1.20*  CALCIUM 9.5 8.6*  MG 1.9  --   PROT 5.8* 5.2*  ALBUMIN 3.6 3.2*  AST 146* 420*   ALT 79* 340*  ALKPHOS 82 148*  BILITOT 1.2 1.2  GFRNONAA 41* 44*  ANIONGAP 14 8    Lipids No results for input(s): "CHOL", "TRIG", "HDL", "LABVLDL", "LDLCALC", "CHOLHDL" in the last 168 hours.  Hematology Recent Labs  Lab 01/24/22 0438 01/25/22 0415  WBC 9.2 4.8  RBC 3.65* 3.19*  HGB 12.2 10.8*  HCT 36.4 32.4*  MCV 99.7 101.6*  MCH 33.4 33.9  MCHC 33.5 33.3  RDW 13.4 14.0  PLT 145* 120*   Thyroid No results for input(s): "TSH", "FREET4" in the last 168 hours.  BNPNo results for input(s): "BNP", "PROBNP" in the last 168 hours.  DDimer No results for input(s): "DDIMER" in the last 168 hours.   Radiology    US Abdomen Limited RUQ (LIVER/GB)  Result Date: 01/24/2022 CLINICAL DATA:  Right upper quadrant pain EXAM: ULTRASOUND ABDOMEN LIMITED RIGHT UPPER QUADRANT COMPARISON:  11/22/2017. FINDINGS: Gallbladder: Gallstones are noted measuring up to 1.5 cm. Mild gallbladder wall thickening measures 3.5 mm. Pericholecystic fluid noted. No sonographic Murphy's sign reported by the sonographer. Common bile duct: Diameter: 3 mm Liver: Increased parenchymal echogenicity. No focal liver lesion. Portal vein is patent on  color Doppler imaging with normal direction of blood flow towards the liver. Other: Trace perihepatic ascites. IMPRESSION: 1. Gallstones and gallbladder wall thickening. Pericholecystic fluid noted. No sonographic Murphy's sign reported by the sonographer. Cannot exclude acute cholecystitis. If there is a high clinical suspicion for acute cholecystitis consider further evaluation with HIDA scan. 2. Hepatic steatosis. 3. Trace perihepatic ascites. Electronically Signed   By: Kerby Moors M.D.   On: 01/24/2022 07:47   DG Chest Portable 1 View  Result Date: 01/24/2022 CLINICAL DATA:  88 year old female with bradycardia, chest and abdominal pain. EXAM: PORTABLE CHEST 1 VIEW COMPARISON:  Chest radiographs 06/24/2020 and earlier. FINDINGS: Portable AP view at 0453 hours. Chronic  cardiomegaly, cardiac valve replacement. Stable lung volumes and mediastinal contours when allowing for portable technique. Stable pulmonary vascularity, no overt edema. However, evidence of a small new right pleural effusion at the costophrenic angle. No pneumothorax or consolidation. No acute osseous abnormality identified. Paucity of bowel gas in the upper abdomen. Calcified aortic atherosclerosis. IMPRESSION: 1. Evidence of a small right pleural effusion, new from last year. No overt edema. 2. Chronic cardiomegaly.  Aortic Atherosclerosis (ICD10-I70.0). Electronically Signed   By: Genevie Ann M.D.   On: 01/24/2022 05:30    Cardiac Studies   N/a   Patient Profile     88 y.o. female with a hx of breast cancer, COPD, HTN, HLD, afib, prior CVA, AS s/p aortic valve replacement with bioprosthetic valve who was seen for the evaluation of preoperative cardiac risk evaluation at the request of hospitalist and surgical teams.   Assessment & Plan    Pre op Evaluation Cholecystitis Elevated LFTs/alk phos -- dx with cholecystitis, seen by gen surgery with plans for lap chole after Eliquis wash out -- overall has been stable from a cardiac standpoint and appears optimized on exam -- no further cardiac work up planned   Permanent Atrial fibrillation -- rates are well controlled, mostly bradycardic  -- as above Eliquis held with plans for surgery  AS s/p aortic valve replacement w/bioprosthetic valve '14 -- echo 07/2021 with stable valve placement  HTN -- overall blood pressures well controlled -- continue metoprolol 43m BID  For questions or updates, please contact CReid Hope KingPlease consult [email or web address] for contact info under        Signed, LReino Bellis NP  01/25/2022, 8:24 AM     Agree with note by LReino BellisNP-C  Patient well-known to me from the clinic setting.  She is a 88year old Caucasian female with a history of persistent A-fib status post porcine AVR in the  past with stable valve by 2D echo 6/23.  She has been rate controlled on Eliquis oral anticoagulation.  The problems include hypertension.  She does have normal coronary arteries at the time of cath prior to her aortic valve replacement.  She is admitted with abdominal pain.  LFTs are elevated and work-up otherwise is remarkable for probable cholecystitis.  Her exam is benign.  Eliquis has been held.  I believe she is a good candidate for laparoscopic cholecystectomy from a cardiac point of view.  We will continue to follow her until post procedure.  JLorretta Harp M.D., FL[place] FBaptist Surgery And [facility] FLaverta BaltimoreFWoonsocket[address] [place]riendsville [place]  [id number] [phone]/27/2023 10:32 AM

## 2022-01-26 ENCOUNTER — Encounter (HOSPITAL_COMMUNITY): Payer: Self-pay | Admitting: General Surgery

## 2022-01-26 DIAGNOSIS — Z853 Personal history of malignant neoplasm of breast: Secondary | ICD-10-CM | POA: Diagnosis not present

## 2022-01-26 DIAGNOSIS — I4821 Permanent atrial fibrillation: Secondary | ICD-10-CM | POA: Diagnosis present

## 2022-01-26 DIAGNOSIS — F05 Delirium due to known physiological condition: Secondary | ICD-10-CM | POA: Diagnosis not present

## 2022-01-26 DIAGNOSIS — M810 Age-related osteoporosis without current pathological fracture: Secondary | ICD-10-CM | POA: Diagnosis present

## 2022-01-26 DIAGNOSIS — Z810 Family history of intellectual disabilities: Secondary | ICD-10-CM | POA: Diagnosis not present

## 2022-01-26 DIAGNOSIS — I129 Hypertensive chronic kidney disease with stage 1 through stage 4 chronic kidney disease, or unspecified chronic kidney disease: Secondary | ICD-10-CM | POA: Diagnosis present

## 2022-01-26 DIAGNOSIS — Z96649 Presence of unspecified artificial hip joint: Secondary | ICD-10-CM | POA: Diagnosis present

## 2022-01-26 DIAGNOSIS — D6869 Other thrombophilia: Secondary | ICD-10-CM | POA: Diagnosis present

## 2022-01-26 DIAGNOSIS — K8000 Calculus of gallbladder with acute cholecystitis without obstruction: Secondary | ICD-10-CM | POA: Diagnosis present

## 2022-01-26 DIAGNOSIS — Z79899 Other long term (current) drug therapy: Secondary | ICD-10-CM | POA: Diagnosis not present

## 2022-01-26 DIAGNOSIS — I739 Peripheral vascular disease, unspecified: Secondary | ICD-10-CM | POA: Diagnosis present

## 2022-01-26 DIAGNOSIS — Z885 Allergy status to narcotic agent status: Secondary | ICD-10-CM | POA: Diagnosis not present

## 2022-01-26 DIAGNOSIS — K810 Acute cholecystitis: Secondary | ICD-10-CM | POA: Diagnosis present

## 2022-01-26 DIAGNOSIS — G9341 Metabolic encephalopathy: Secondary | ICD-10-CM | POA: Diagnosis present

## 2022-01-26 DIAGNOSIS — J449 Chronic obstructive pulmonary disease, unspecified: Secondary | ICD-10-CM | POA: Diagnosis present

## 2022-01-26 DIAGNOSIS — H9190 Unspecified hearing loss, unspecified ear: Secondary | ICD-10-CM | POA: Diagnosis present

## 2022-01-26 DIAGNOSIS — E785 Hyperlipidemia, unspecified: Secondary | ICD-10-CM | POA: Diagnosis present

## 2022-01-26 DIAGNOSIS — N1831 Chronic kidney disease, stage 3a: Secondary | ICD-10-CM | POA: Diagnosis present

## 2022-01-26 DIAGNOSIS — I10 Essential (primary) hypertension: Secondary | ICD-10-CM | POA: Diagnosis not present

## 2022-01-26 DIAGNOSIS — I4811 Longstanding persistent atrial fibrillation: Secondary | ICD-10-CM | POA: Diagnosis not present

## 2022-01-26 DIAGNOSIS — Z7901 Long term (current) use of anticoagulants: Secondary | ICD-10-CM | POA: Diagnosis not present

## 2022-01-26 DIAGNOSIS — Z888 Allergy status to other drugs, medicaments and biological substances status: Secondary | ICD-10-CM | POA: Diagnosis not present

## 2022-01-26 DIAGNOSIS — Z953 Presence of xenogenic heart valve: Secondary | ICD-10-CM | POA: Diagnosis not present

## 2022-01-26 DIAGNOSIS — Z8673 Personal history of transient ischemic attack (TIA), and cerebral infarction without residual deficits: Secondary | ICD-10-CM | POA: Diagnosis not present

## 2022-01-26 DIAGNOSIS — Z809 Family history of malignant neoplasm, unspecified: Secondary | ICD-10-CM | POA: Diagnosis not present

## 2022-01-26 LAB — COMPREHENSIVE METABOLIC PANEL
ALT: 247 U/L — ABNORMAL HIGH (ref 0–44)
AST: 194 U/L — ABNORMAL HIGH (ref 15–41)
Albumin: 3.3 g/dL — ABNORMAL LOW (ref 3.5–5.0)
Alkaline Phosphatase: 133 U/L — ABNORMAL HIGH (ref 38–126)
Anion gap: 8 (ref 5–15)
BUN: 16 mg/dL (ref 8–23)
CO2: 24 mmol/L (ref 22–32)
Calcium: 9 mg/dL (ref 8.9–10.3)
Chloride: 108 mmol/L (ref 98–111)
Creatinine, Ser: 1.15 mg/dL — ABNORMAL HIGH (ref 0.44–1.00)
GFR, Estimated: 46 mL/min — ABNORMAL LOW (ref >60)
Glucose, Bld: 147 mg/dL — ABNORMAL HIGH (ref 70–99)
Potassium: 4.1 mmol/L (ref 3.5–5.1)
Sodium: 140 mmol/L (ref 135–145)
Total Bilirubin: 1 mg/dL (ref 0.3–1.2)
Total Protein: 5.6 g/dL — ABNORMAL LOW (ref 6.5–8.1)

## 2022-01-26 LAB — CBC
HCT: 34.2 % — ABNORMAL LOW (ref 36.0–46.0)
Hemoglobin: 11.3 g/dL — ABNORMAL LOW (ref 12.0–15.0)
MCH: 32.9 pg (ref 26.0–34.0)
MCHC: 33 g/dL (ref 30.0–36.0)
MCV: 99.7 fL (ref 80.0–100.0)
Platelets: 126 10*3/uL — ABNORMAL LOW (ref 150–400)
RBC: 3.43 MIL/uL — ABNORMAL LOW (ref 3.87–5.11)
RDW: 13.7 % (ref 11.5–15.5)
WBC: 9.5 10*3/uL (ref 4.0–10.5)
nRBC: 0 % (ref 0.0–0.2)

## 2022-01-26 MED ORDER — APIXABAN 5 MG PO TABS
5.0000 mg | ORAL_TABLET | Freq: Two times a day (BID) | ORAL | Status: DC
  Administered 2022-01-26 (×2): 5 mg via ORAL
  Filled 2022-01-26 (×5): qty 1

## 2022-01-26 MED ORDER — OXYCODONE HCL 5 MG PO TABS: 2.5000 mg | ORAL_TABLET | ORAL | Status: DC | PRN

## 2022-01-26 MED ORDER — ENSURE ENLIVE PO LIQD
237.0000 mL | Freq: Two times a day (BID) | ORAL | Status: DC
  Administered 2022-01-26: 237 mL via ORAL

## 2022-01-26 MED ORDER — ACETAMINOPHEN 325 MG PO TABS
650.0000 mg | ORAL_TABLET | Freq: Four times a day (QID) | ORAL | Status: DC
  Administered 2022-01-26 – 2022-01-29 (×4): 650 mg via ORAL
  Filled 2022-01-26 (×9): qty 2

## 2022-01-26 MED ORDER — LIDOCAINE 5 % EX PTCH
1.0000 | MEDICATED_PATCH | CUTANEOUS | Status: DC
  Administered 2022-01-26 – 2022-01-28 (×3): 1 via TRANSDERMAL
  Filled 2022-01-26 (×3): qty 1

## 2022-01-26 MED ORDER — ACETAMINOPHEN 650 MG RE SUPP: 650.0000 mg | Freq: Four times a day (QID) | RECTAL | Status: DC

## 2022-01-26 NOTE — Progress Notes (Addendum)
Rounding Note    Patient Name: [name] Date of Encounter: 01/26/2022  [place] Cardiologist: None   Subjective   Patient sitting up in her bedside chair. Appears to have moderate confusion. Patient's son is at bedside and confirms post-operative confusion that has been worse than baseline. Patient denies any focal cardiac symptoms including chest pain, shortness of breath, palpitations.  Inpatient Medications    Scheduled Meds:  irbesartan  150 mg Oral Daily   metoprolol tartrate  25 mg Oral BID   Continuous Infusions:  dextrose 5 % and 0.9% NaCl 75 mL/hr at 01/26/22 0245   lactated ringers 75 mL/hr at 01/24/22 2217   piperacillin-tazobactam (ZOSYN)  IV 3.375 g (01/26/22 0509)   PRN Meds: acetaminophen **OR** acetaminophen, haloperidol lactate, hydrALAZINE, morphine injection, ondansetron **OR** ondansetron (ZOFRAN) IV, oxyCODONE, traMADol   Vital Signs    Vitals:   01/25/22 1936 01/25/22 2249 01/26/22 0440 01/26/22 0745  BP: 128/62 (!) 159/77 (!) 146/72 138/69  Pulse: (!) 55 99 98 86  Resp: '18 17 18 16  '$ Temp: (!) 97.5 F (36.4 C) (!) 97.5 F (36.4 C) 98.8 F (37.1 C) 98.7 F (37.1 C)  TempSrc: Oral Oral Oral Oral  SpO2: 91% 97% 98% 97%    Intake/Output Summary (Last 24 hours) at 01/26/2022 1053 Last data filed at 01/26/2022 0538 Gross per 24 hour  Intake 1224.57 ml  Output 410 ml  Net 814.57 ml      01/06/2022    2:22 PM 12/25/2021    1:57 PM 07/15/2021    3:19 PM  Last 3 Weights  Weight (lbs) 155 lb 2 oz 157 lb 11.2 oz 154 lb 6.4 oz  Weight (kg) 70.364 kg 71.532 kg 70.035 kg      Telemetry    No telemetry available  ECG    No new tracing  Physical Exam   GEN: No acute distress.   Neck: No JVD Cardiac: irregularly irregular. Systolic murmur LUSB Respiratory: Clear to auscultation bilaterally. GI: Soft, nontender, non-distended  MS: No edema; No deformity. Neuro:  Nonfocal  Psych: confusion  Labs    High  Sensitivity Troponin:   Recent Labs  Lab 01/24/22 0438 01/24/22 0638  TROPONINIHS 9 10     Chemistry Recent Labs  Lab 01/24/22 0438 01/25/22 0415 01/26/22 0725  NA 144 138 140  K 4.1 4.5 4.1  CL 105 107 108  CO2 '25 23 24  '$ GLUCOSE 114* 94 147*  BUN '16 15 16  '$ CREATININE 1.26* 1.20* 1.15*  CALCIUM 9.5 8.6* 9.0  MG 1.9  --   --   PROT 5.8* 5.2* 5.6*  ALBUMIN 3.6 3.2* 3.3*  AST 146* 420* 194*  ALT 79* 340* 247*  ALKPHOS 82 148* 133*  BILITOT 1.2 1.2 1.0  GFRNONAA 41* 44* 46*  ANIONGAP '14 8 8    '$ Lipids No results for input(s): "CHOL", "TRIG", "HDL", "LABVLDL", "LDLCALC", "CHOLHDL" in the last 168 hours.  Hematology Recent Labs  Lab 01/24/22 0438 01/25/22 0415 01/26/22 0725  WBC 9.2 4.8 9.5  RBC 3.65* 3.19* 3.43*  HGB 12.2 10.8* 11.3*  HCT 36.4 32.4* 34.2*  MCV 99.7 101.6* 99.7  MCH 33.4 33.9 32.9  MCHC 33.5 33.3 33.0  RDW 13.4 14.0 13.7  PLT 145* 120* 126*   Thyroid No results for input(s): "TSH", "FREET4" in the last 168 hours.  BNPNo results for input(s): "BNP", "PROBNP" in the last 168 hours.  DDimer No results for input(s): "DDIMER" in the last 168  hours.   Radiology    No results found.  Cardiac Studies   N/A  Patient Profile     88 y.o. female with a hx of breast cancer, COPD, HTN, HLD, afib, prior CVA, AS s/p aortic valve replacement with bioprosthetic valve who was seen for the evaluation of preoperative cardiac risk evaluation at the request of hospitalist and surgical teams.    Assessment & Plan    Cholecystitis Elevated LFTs  Patient admitted in the setting of acute cholecystitis. She is now s/p laparoscopic cholecystectomy on 11/27.   Permanent atrial fibrillation Secondary hypercoagulable state  Patient followed in the outpatient setting by Dr. Gwenlyn Found. Continues with adequate rate control on Metoprolol. Resume Eliquis prior to d/c per surgical team clearance following laparoscopic cholecystectomy.  Hypertension  Patient acutely  hypertensive in the peri-surgical period, now appears closer to baseline. Would continue Metoprolol Tartrate '25mg'$  BID at this time.  AS s/p aortic valve replacement with bioprosthetic 08/30/12  Patient without chest pain or shortness of breath. June 2023 TTE shows stable valve, mean gradient 11.64mHg.   CMirandawill sign off.     For questions or updates, please contact CMiddleportPlease consult [email or web address] for contact info under        Signed, ELily Kocher PA-C  01/26/2022, 10:53 AM    Agree with note by ELily KocherPA-C  POD # 1 lap chole. Pt stable for CV point of view. Currently up in chair eating. Mildly confused. VSS. AFIB with CVR. Resume DOAC per primary attending prior to DC. Will arrange OP F/U. Will S/O  JLorretta Harp M.D., FWaldo FMartin Army Community Hospital FLaverta BaltimoreFBlackstone[address] SOakville [place]  [id number] [phone]/28/2023 11:47 AM

## 2022-01-26 NOTE — Evaluation (Signed)
Physical Therapy Evaluation Patient Details Name: [name] MRN: [id number] DOB: [birth year] Today's Date: 01/26/2022  History of Present Illness  Pt is a 88 yr old female who presented 01/24/22 due to chest/abdominal pain . US showed gallstones and also found to have cholecystitis. S/P laparoscopic cholecystectomy 11/27. PMH includes breast CA, COPD, AVR, CKD, PAF, HTN, change in voice, L thalamic stroke, THA  Clinical Impression  Pt admitted with above diagnosis. Pt from home alone with 13 steps to enter. Son helps do her shopping and taking her to appts but she was independent within her home. Today, she is confused with much tangential conversation and highly distractable. Not safe to be home alone. Pt also with unsteadiness without UE support and she did not use an AD PTA. Recommend SNF before going home.  Pt currently with functional limitations due to the deficits listed below (see PT Problem List). Pt will benefit from skilled PT to increase their independence and safety with mobility to allow discharge to the venue listed below.          Recommendations for follow up therapy are one component of a multi-disciplinary discharge planning process, led by the attending physician.  Recommendations may be updated based on patient status, additional functional criteria and insurance authorization.  Follow Up Recommendations Skilled nursing-short term rehab (<3 hours/day) Can patient physically be transported by private vehicle: Yes    Assistance Recommended at Discharge Frequent or constant Supervision/Assistance  Patient can return home with the following  A little help with walking and/or transfers;A little help with bathing/dressing/bathroom;Assistance with cooking/housework;Help with stairs or ramp for entrance;Assist for transportation;Direct supervision/assist for financial management;Direct supervision/assist for medications management    Equipment Recommendations None recommended  by PT  Recommendations for Other Services       Functional Status Assessment Patient has had a recent decline in their functional status and demonstrates the ability to make significant improvements in function in a reasonable and predictable amount of time.     Precautions / Restrictions Precautions Precautions: Fall Restrictions Weight Bearing Restrictions: No      Mobility  Bed Mobility               General bed mobility comments: received in recliner    Transfers Overall transfer level: Needs assistance Equipment used: Rolling walker (2 wheels) Transfers: Sit to/from Stand Sit to Stand: Min assist           General transfer comment: min A for power up from recliner and toilet. verbal and tactile cues needed. Pt not familiar with use of RW so this was confusing for her as well but used to steady on eval    Ambulation/Gait Ambulation/Gait assistance: Min assist Gait Distance (Feet): 70 Feet Assistive device: Rolling walker (2 wheels) Gait Pattern/deviations: Step-through pattern, Decreased stride length Gait velocity: decreased Gait velocity interpretation: <1.31 ft/sec, indicative of household ambulator   General Gait Details: pt needed frequent vc's to keep going as she kept getting distracted by objects in the environment such as the newspaper on the counter.  Stairs            Wheelchair Mobility    Modified Rankin (Stroke Patients Only)       Balance Overall balance assessment: Needs assistance Sitting-balance support: Feet supported, Bilateral upper extremity supported Sitting balance-Leahy Scale: Fair     Standing balance support: Bilateral upper extremity supported, Reliant on assistive device for balance Standing balance-Leahy Scale: Poor Standing balance comment: unsteady without UE support  Pertinent Vitals/Pain Pain Assessment Pain Assessment: Faces Faces Pain Scale: Hurts little  more Pain Location: abdomen Pain Descriptors / Indicators: Guarding, Discomfort Pain Intervention(s): Monitored during session    Home Living Family/patient expects to be discharged to:: Private residence Living Arrangements: Alone Available Help at Discharge: Family Type of Home: House Home Access: Stairs to enter Entrance Stairs-Rails: Psychiatric nurse of Steps: 13   Home Layout: One level Home Equipment: Conservation officer, nature (2 wheels)      Prior Function Prior Level of Function : Independent/Modified Independent             Mobility Comments: normally remains home but does not use any DME ADLs Comments: Per son they only needed asist with groceries and going out of home     Hand Dominance   Dominant Hand: Right    Extremity/Trunk Assessment   Upper Extremity Assessment Upper Extremity Assessment: Defer to OT evaluation    Lower Extremity Assessment Lower Extremity Assessment: Generalized weakness    Cervical / Trunk Assessment Cervical / Trunk Assessment: Kyphotic  Communication   Communication: Expressive difficulties;Receptive difficulties;Other (comment) (Per son she has expressive difficulties at baseline)  Cognition Arousal/Alertness: Awake/alert Behavior During Therapy: WFL for tasks assessed/performed Overall Cognitive Status: Impaired/Different from baseline Area of Impairment: Problem solving, Attention, Orientation, Awareness                 Orientation Level: Disoriented to, Situation, Place, Time Current Attention Level: Focused       Awareness: Intellectual Problem Solving: Slow processing, Requires verbal cues General Comments: pt could not remember which hospital she is in. When told she could state that that is in [place]. Was also unsure at one point why she was here. Very distracted by environment when out of room. Had great difficulty problem solving use of toilet paper when toileting. Tangential conversation throughout  session.        General Comments General comments (skin integrity, edema, etc.): VSS    Exercises     Assessment/Plan    PT Assessment Patient needs continued PT services  PT Problem List Decreased activity tolerance;Decreased mobility;Decreased knowledge of use of DME;Decreased knowledge of precautions;Pain       PT Treatment Interventions DME instruction;Gait training;Functional mobility training;Therapeutic activities;Therapeutic exercise;Balance training;Patient/family education;Cognitive remediation    PT Goals (Current goals can be found in the Care Plan section)  Acute Rehab PT Goals Patient Stated Goal: none stated PT Goal Formulation: With patient Time For Goal Achievement: 02/09/22 Potential to Achieve Goals: Good    Frequency Min 2X/week     Co-evaluation               AM-PAC PT "6 Clicks" Mobility  Outcome Measure Help needed turning from your back to your side while in a flat bed without using bedrails?: A Little Help needed moving from lying on your back to sitting on the side of a flat bed without using bedrails?: A Little Help needed moving to and from a bed to a chair (including a wheelchair)?: A Little Help needed standing up from a chair using your arms (e.g., wheelchair or bedside chair)?: A Little Help needed to walk in hospital room?: A Little Help needed climbing 3-5 steps with a railing? : A Lot 6 Click Score: 17    End of Session Equipment Utilized During Treatment: Gait belt Activity Tolerance: Patient tolerated treatment well Patient left: in chair;with call bell/phone within reach;with chair alarm set Nurse Communication: Mobility status PT Visit Diagnosis: Unsteadiness on feet (  R26.81);Difficulty in walking, not elsewhere classified (R26.2);Pain Pain - part of body:  (abdomen)    Time: 2179-8102 PT Time Calculation (min) (ACUTE ONLY): 31 min   Charges:   PT Evaluation $PT Eval Moderate Complexity: 1 Mod PT Treatments $Gait  Training: 8-22 mins        Leighton Roach, PT  Acute Rehab Services Secure chat preferred Office [place] 01/26/2022, 4:27 PM

## 2022-01-26 NOTE — Progress Notes (Signed)
PROGRESS NOTE    [name]  PZW:[id number] DOB: November 06, 1931 DOA: 01/24/2022 PCP: Bonnie Chandler, NP     Brief Narrative:  [name] is a 88 y.o. female with medical history significant of AVR, breast cancer, COPD, HTN, HLD, afib, and CVA presenting with chest/abdominal pain.   She started hurting on her R side, better now since the pain medication.  Her BP was high, HR in 30s.  Her right side and chest were hurting.  Pain started Friday night and worsened through Saturday.  +nausea.  RUQ [place] with stones, wall thickening.  General surgery as well as cardiology were consulted.  Surgery recommended lap cholecystectomy.  She underwent procedure 11/27.  Hospitalization further complicated by intermittent delirium.   New events last 24 hours / Subjective: Patient sitting in recliner this morning.  Speech is fragmented, difficult to understand.  Family is at bedside, states that her speech is close to baseline.  She tells me that she had episode of vomiting earlier this morning.  Continues to have some pain in her abdomen.  Assessment & Plan:   Principal Problem:   Acute cholecystitis Active Problems:   Essential hypertension   S/P aortic valve replacement with bioprosthetic valve   Atrial fibrillation (HCC)   CKD (chronic kidney disease) stage 3, GFR 30-59 ml/min (HCC)   CVA (cerebral vascular accident) ([place])   Dyslipidemia   S/P laparoscopic cholecystectomy   Acute cholecystitis -Status post laparoscopic cholecystectomy 11/27 -General surgery following -LFTs improving  Acute metabolic encephalopathy, delirium -Delirium precaution -Haldol as needed for agitation  Permanent A-fib -Appreciate cardiology -Metoprolol -Resume Eliquis once cleared from general surgery standpoint  Hypertension -Irbesartan, metoprolol  CKD stage IIIa -Baseline creatinine 1.2 -Stable  History of CVA History of aortic stenosis status post aortic valve replacement  DVT prophylaxis:    SCD's Start: 01/25/22 1553 SCDs Start: 01/24/22 1005  Code Status: Full code Family Communication: Son at bedside Disposition Plan:  Status is: Observation The patient will require care spanning > 2 midnights and should be moved to inpatient because: Continue to monitor her confusion postoperatively.  Needs PT OT evaluation  Consultants:  General surgery Cardiology   Antimicrobials:  Anti-infectives (From admission, onward)    Start     Dose/Rate Route Frequency Ordered Stop   01/24/22 1015  piperacillin-tazobactam (ZOSYN) IVPB 3.375 g        3.375 g 12.5 mL/hr over 240 Minutes Intravenous Every 8 hours 01/24/22 1005 01/31/22 1359   01/24/22 0830  cefTRIAXone (ROCEPHIN) 2 g in sodium chloride 0.9 % 100 mL IVPB        2 g 200 mL/hr over 30 Minutes Intravenous  Once 01/24/22 0822 01/24/22 0929        Objective: Vitals:   01/25/22 1936 01/25/22 2249 01/26/22 0440 01/26/22 0745  BP: 128/62 (!) 159/77 (!) 146/72 138/69  Pulse: (!) 55 99 98 86  Resp: '18 17 18 16  '$ Temp: (!) 97.5 F (36.4 C) (!) 97.5 F (36.4 C) 98.8 F (37.1 C) 98.7 F (37.1 C)  TempSrc: Oral Oral Oral Oral  SpO2: 91% 97% 98% 97%    Intake/Output Summary (Last 24 hours) at 01/26/2022 1129 Last data filed at 01/26/2022 4235 Gross per 24 hour  Intake 1224.57 ml  Output 410 ml  Net 814.57 ml   There were no vitals filed for this visit.  Examination:  General exam: Appears calm and comfortable  Respiratory system: Clear to auscultation. Respiratory effort normal. No respiratory distress. No conversational  dyspnea.  Cardiovascular system: S1 & S2 heard. Gastrointestinal system: Abdomen is nondistended, soft and mildly tender to palpation right upper quadrant and epigastric Central nervous system: Alert, speech pattern is fragmented Extremities: Symmetric in appearance  Skin: No rashes, lesions or ulcers on exposed skin   Data Reviewed: I have personally reviewed following labs and imaging  studies  CBC: Recent Labs  Lab 01/24/22 0438 01/25/22 0415 01/26/22 0725  WBC 9.2 4.8 9.5  NEUTROABS 6.6  --   --   HGB 12.2 10.8* 11.3*  HCT 36.4 32.4* 34.2*  MCV 99.7 101.6* 99.7  PLT 145* 120* 578*   Basic Metabolic Panel: Recent Labs  Lab 01/24/22 0438 01/25/22 0415 01/26/22 0725  NA 144 138 140  K 4.1 4.5 4.1  CL 105 107 108  CO2 '25 23 24  '$ GLUCOSE 114* 94 147*  BUN '16 15 16  '$ CREATININE 4.69* 1.20* 1.15*  CALCIUM 9.5 8.6* 9.0  MG 1.9  --   --    GFR: CrCl cannot be calculated (Unknown ideal weight.). Liver Function Tests: Recent Labs  Lab 01/24/22 0438 01/25/22 0415 01/26/22 0725  AST 146* 420* 194*  ALT 79* 340* 247*  ALKPHOS 82 148* 133*  BILITOT 1.2 1.2 1.0  PROT 5.8* 5.2* 5.6*  ALBUMIN 3.6 3.2* 3.3*   Recent Labs  Lab 01/24/22 0438  LIPASE 117*   No results for input(s): "AMMONIA" in the last 168 hours. Coagulation Profile: No results for input(s): "INR", "PROTIME" in the last 168 hours. Cardiac Enzymes: No results for input(s): "CKTOTAL", "CKMB", "CKMBINDEX", "TROPONINI" in the last 168 hours. BNP (last 3 results) No results for input(s): "PROBNP" in the last 8760 hours. HbA1C: No results for input(s): "HGBA1C" in the last 72 hours. CBG: No results for input(s): "GLUCAP" in the last 168 hours. Lipid Profile: No results for input(s): "CHOL", "HDL", "LDLCALC", "TRIG", "CHOLHDL", "LDLDIRECT" in the last 72 hours. Thyroid Function Tests: No results for input(s): "TSH", "T4TOTAL", "FREET4", "T3FREE", "THYROIDAB" in the last 72 hours. Anemia Panel: No results for input(s): "VITAMINB12", "FOLATE", "FERRITIN", "TIBC", "IRON", "RETICCTPCT" in the last 72 hours. Sepsis Labs: No results for input(s): "PROCALCITON", "LATICACIDVEN" in the last 168 hours.  No results found for this or any previous visit (from the past 240 hour(s)).    Radiology Studies: No results found.    Scheduled Meds:  irbesartan  150 mg Oral Daily   metoprolol tartrate   25 mg Oral BID   Continuous Infusions:  dextrose 5 % and 0.9% NaCl 75 mL/hr at 01/26/22 0245   lactated ringers 75 mL/hr at 01/24/22 2217   piperacillin-tazobactam (ZOSYN)  IV 3.375 g (01/26/22 0509)     LOS: 1 day     Dessa Phi, DO [facility] 01/26/2022, 11:29 AM   Available via Epic secure chat 7am-7pm After these hours, please refer to coverage provider listed on [email or web address]

## 2022-01-26 NOTE — Evaluation (Signed)
Occupational Therapy Evaluation Patient Details Name: [name] MRN: [id number] DOB: Aug 09, 1931 Today's Date: 01/26/2022   History of Present Illness Pt is a 88 yr old female who presented 01/24/22 due to chest/abdominal pain . US showed gallstones and also found to have cholecystitis. S/P laparoscopic cholecystectomy 11/27. PMH includes breast CA, COPD, AVR, CKD, PAF, HTN, change in voice, L thalamic stroke, THA   Clinical Impression   Spoke to son via phone at [facility] pt did not require any DME/AE and lives alone. Pt's son assists with picking up groceries as needed and out of home activities. Pt requires 13 steps to enter the home and then lives on a single level home. Pt at this time required mod assist with bed mobility with increase in time to cue pt step by step instructions. Mrs. Froman requires min assist with UE ADLS and max with LB ADLS. Pt currently with functional limitations due to the deficits listed below (see OT Problem List).  Pt will benefit from skilled OT to increase their safety and independence with ADL and functional mobility for ADL to facilitate discharge to venue listed below.        Recommendations for follow up therapy are one component of a multi-disciplinary discharge planning process, led by the attending physician.  Recommendations may be updated based on patient status, additional functional criteria and insurance authorization.   Follow Up Recommendations  Skilled nursing-short term rehab (<3 hours/day) (Pt may make good progress to [facility] as noted increase in confusion and pain at this time)    Assistance Recommended at Discharge Frequent or constant Supervision/Assistance  Patient can return home with the following A lot of help with walking and/or transfers;A lot of help with bathing/dressing/bathroom;Assistance with cooking/housework;Direct supervision/assist for medications management;Direct supervision/assist for financial management;Assist for  transportation    Functional Status Assessment  Patient has had a recent decline in their functional status and demonstrates the ability to make significant improvements in function in a reasonable and predictable amount of time.  Equipment Recommendations  None recommended by OT (TBD at next level of care.)    Recommendations for Other Services       Precautions / Restrictions Precautions Precautions: Fall Restrictions Weight Bearing Restrictions: No      Mobility Bed Mobility Overal bed mobility: Needs Assistance Bed Mobility: Supine to Sit     Supine to sit: Mod assist     General bed mobility comments: Pt needed step by steps cues    Transfers Overall transfer level: Needs assistance Equipment used: Rolling walker (2 wheels) Transfers: Sit to/from Stand Sit to Stand: Min assist, Mod assist, From elevated surface           General transfer comment: As pt increased the amount of times they transfered decrease in cues/assist needed      Balance Overall balance assessment: Needs assistance Sitting-balance support: Feet supported, Bilateral upper extremity supported Sitting balance-Leahy Scale: Fair   Postural control: Posterior lean Standing balance support: Bilateral upper extremity supported, Reliant on assistive device for balance Standing balance-Leahy Scale: Poor                             ADL either performed or assessed with clinical judgement   ADL Overall ADL's : Needs assistance/impaired Eating/Feeding: Minimal assistance;Sitting Eating/Feeding Details (indicate cue type and reason): noted needed hand over hand to start holding cup but then was able to grasp Grooming: Wash/dry hands;Wash/dry face;Minimal assistance;Sitting  Upper Body Bathing: Minimal assistance;Cueing for safety;Cueing for sequencing;Sitting   Lower Body Bathing: Maximal assistance;Cueing for safety;Cueing for sequencing;Sitting/lateral leans   Upper Body  Dressing : Minimal assistance;Sitting   Lower Body Dressing: Maximal assistance;Sitting/lateral leans   Toilet Transfer: Minimal assistance;Cueing for safety;Cueing for sequencing;Rolling walker (2 wheels);Stand-pivot;BSC/3in1   Toileting- Water quality scientist and Hygiene: Maximal assistance;Cueing for safety;Cueing for sequencing;Sit to/from stand       Functional mobility during ADLs: Minimal assistance;Cueing for safety;Cueing for sequencing;Rolling walker (2 wheels)       Vision Baseline Vision/History: 1 Wears glasses       Perception     Praxis      Pertinent Vitals/Pain Pain Assessment Pain Assessment: Faces Faces Pain Scale: Hurts even more Breathing: normal Negative Vocalization: occasional moan/groan, low speech, negative/disapproving quality Facial Expression: sad, frightened, frown Body Language: relaxed Consolability: no need to console PAINAD Score: 2 Pain Location: abdomen Pain Descriptors / Indicators: Guarding, Discomfort Pain Intervention(s): Limited activity within patient's tolerance, Repositioned     Hand Dominance Right   Extremity/Trunk Assessment Upper Extremity Assessment Upper Extremity Assessment: RUE deficits/detail;LUE deficits/detail RUE Deficits / Details: BUE noted to have edma but RUE greater then LUE. Pt noted to have hx of stroke. Pt limited in FM greater then LUE but pt reported being R handed. Pt limited to about 60 degrees active flexion. RUE Sensation: WNL RUE Coordination: decreased fine motor;decreased gross motor LUE Deficits / Details: edema noted and slightly increase AROM compared to RUE to about 70 degrees flexion LUE Sensation: WNL LUE Coordination: decreased fine motor;decreased gross motor   Lower Extremity Assessment Lower Extremity Assessment: Defer to PT evaluation   Cervical / Trunk Assessment Cervical / Trunk Assessment: Kyphotic   Communication Communication Communication: Expressive difficulties;Receptive  difficulties;Other (comment) (Per son they have difficulties with expressive language at baseline)   Cognition Arousal/Alertness: Awake/alert Behavior During Therapy: WFL for tasks assessed/performed Overall Cognitive Status: Difficult to assess                                       General Comments       Exercises     Shoulder Instructions      Home Living Family/patient expects to be discharged to:: Private residence Living Arrangements: Alone Available Help at Discharge: Family Type of Home: House Home Access: Stairs to enter [employer] of Steps: 13 Entrance Stairs-Rails: Right;Left Home Layout: One level     Bathroom Shower/Tub: Walk-in shower;Tub/shower unit   Bathroom Toilet: Standard     Home Equipment: Conservation officer, nature (2 wheels)   Additional Comments: Spoke with son on the phone about PLOF      Prior Functioning/Environment Prior Level of Function : Independent/Modified Independent             Mobility Comments: normally remains home but does not use any DME ADLs Comments: Per son they only needed asist with groceries and going out of home        OT Problem List: Decreased strength;Decreased activity tolerance;Impaired balance (sitting and/or standing);Decreased safety awareness;Decreased knowledge of use of DME or AE;Pain      OT Treatment/Interventions: Self-care/ADL training;DME and/or AE instruction;Therapeutic activities;Patient/family education;Balance training    OT Goals(Current goals can be found in the care plan section) Acute Rehab OT Goals Patient Stated Goal: To have less pain OT Goal Formulation: With patient/family Time For Goal Achievement: 02/09/22 Potential to Achieve Goals: Good ADL Goals  Pt Will Perform Grooming: with set-up;standing Pt Will Perform Upper Body Bathing: with min guard assist;sitting Pt Will Perform Lower Body Bathing: with min assist;sit to/from stand Pt Will Transfer to Toilet: with  supervision;ambulating Pt Will Perform Tub/Shower Transfer: Shower transfer;with min guard assist;ambulating;shower seat;grab bars  OT Frequency: Min 2X/week    Co-evaluation              AM-PAC OT "6 Clicks" Daily Activity     Outcome Measure Help from another person eating meals?: A Little Help from another person taking care of personal grooming?: A Little Help from another person toileting, which includes using toliet, bedpan, or urinal?: A Lot Help from another person bathing (including washing, rinsing, drying)?: A Lot Help from another person to put on and taking off regular upper body clothing?: A Little Help from another person to put on and taking off regular lower body clothing?: A Lot 6 Click Score: 15   End of Session Equipment Utilized During Treatment: Gait belt;Rolling walker (2 wheels) Nurse Communication: Mobility status  Activity Tolerance: Patient tolerated treatment well Patient left: in chair;with call bell/phone within reach;with chair alarm set  OT Visit Diagnosis: Unsteadiness on feet (R26.81);Other abnormalities of gait and mobility (R26.89);Muscle weakness (generalized) (M62.81);Pain Pain - Right/Left:  (abdomen)                Time: 0938-1829 OT Time Calculation (min): 45 min Charges:  OT General Charges $OT Visit: 1 Visit OT Evaluation $OT Eval Low Complexity: 1 Low OT Treatments $Self Care/Home Management : 23-37 mins  Joeseph Amor OTR/L  Acute Rehab Services  [phone] office number [phone] pager number   Joeseph Amor 01/26/2022, 9:33 AM

## 2022-01-26 NOTE — Progress Notes (Signed)
   01/26/22 1520  Clinical Encounter Type  Visited With Patient not available  Visit Type Initial;Social support  Referral From Chaplain  Consult/Referral To Chaplain   Chaplain attempted to visit with patient while on the unit. The patient, [name] was receiving care.  If a chaplain is requested someone will respond.   Danice Goltz [facility]  [phone]

## 2022-01-26 NOTE — Progress Notes (Signed)
Progress Note  1 Day Post-Op  Subjective: Still confused this am and having post op abdominal pain. Son is bedside. She has been drinking clears. She says she has been little nauseas and cannot tell me if she is passing flatus   Objective: Vital signs in last 24 hours: Temp:  [97.5 F (36.4 C)-98.8 F (37.1 C)] 98.7 F (37.1 C) (11/28 0745) Pulse Rate:  [55-135] 86 (11/28 0745) Resp:  [14-23] 16 (11/28 0745) BP: (101-211)/(48-145) 138/69 (11/28 0745) SpO2:  [91 %-100 %] 97 % (11/28 0745) Last BM Date : 01/23/22  Intake/Output from previous day: 11/27 0701 - 11/28 0700 In: 1280.9 [P.O.:120; I.V.:971.4; IV Piggyback:189.5] Out: 410 [Urine:400; Blood:10] Intake/Output this shift: No intake/output data recorded.  PE: General: pleasant, WD, female who is laying in bed in NAD Abd: soft, ND, appropriate TTP near incisions MSK: all 4 extremities are symmetrical with no cyanosis, clubbing, or edema. Skin: warm and dry Psych: A&Ox3, drowsy   Lab Results:  Recent Labs    01/25/22 0415 01/26/22 0725  WBC 4.8 9.5  HGB 10.8* 11.3*  HCT 32.4* 34.2*  PLT 120* 126*    BMET Recent Labs    01/25/22 0415 01/26/22 0725  NA 138 140  K 4.5 4.1  CL 107 108  CO2 23 24  GLUCOSE 94 147*  BUN 15 16  CREATININE 1.20* 1.15*  CALCIUM 8.6* 9.0    PT/INR No results for input(s): "LABPROT", "INR" in the last 72 hours. CMP     Component Value Date/Time   NA 140 01/26/2022 0725   NA 140 02/25/2020 1459   K 4.1 01/26/2022 0725   CL 108 01/26/2022 0725   CO2 24 01/26/2022 0725   GLUCOSE 147 (H) 01/26/2022 0725   BUN 16 01/26/2022 0725   BUN 13 02/25/2020 1459   CREATININE 1.15 (H) 01/26/2022 0725   CREATININE 1.00 (H) 12/25/2021 1437   CALCIUM 9.0 01/26/2022 0725   PROT 5.6 (L) 01/26/2022 0725   PROT 6.0 12/02/2014 0945   ALBUMIN 3.3 (L) 01/26/2022 0725   ALBUMIN 3.9 12/02/2014 0945   AST 194 (H) 01/26/2022 0725   ALT 247 (H) 01/26/2022 0725   ALKPHOS 133 (H)  01/26/2022 0725   BILITOT 1.0 01/26/2022 0725   BILITOT 0.3 12/02/2014 0945   GFRNONAA 46 (L) 01/26/2022 0725   GFRNONAA 50 (L) 11/12/2019 1557   GFRAA 53 (L) 02/25/2020 1459   GFRAA 58 (L) 11/12/2019 1557   Lipase     Component Value Date/Time   LIPASE 117 (H) 01/24/2022 0438       Studies/Results: No results found.  Anti-infectives: Anti-infectives (From admission, onward)    Start     Dose/Rate Route Frequency Ordered Stop   01/24/22 1015  piperacillin-tazobactam (ZOSYN) IVPB 3.375 g        3.375 g 12.5 mL/hr over 240 Minutes Intravenous Every 8 hours 01/24/22 1005 01/31/22 1359   01/24/22 0830  cefTRIAXone (ROCEPHIN) 2 g in sodium chloride 0.9 % 100 mL IVPB        2 g 200 mL/hr over 30 Minutes Intravenous  Once 01/24/22 4315 01/24/22 0929        Assessment/Plan  Cholecystitis POD1 S/p lap chole 11/27 by Dr. Rosendo Gros Can resume eliquis from surgical standpoint Can discontinue abx Multimodal pain control - minimize narcotics  FEN: CLD ADAT soft ID: [email or web address] VTE: can resume eliquis    LOS: 1 day   Winferd Humphrey, [facility] 01/26/2022, 9:46 AM Please see  Amion for pager number during day hours 7:00am-4:30pm

## 2022-01-27 DIAGNOSIS — K8000 Calculus of gallbladder with acute cholecystitis without obstruction: Secondary | ICD-10-CM

## 2022-01-27 DIAGNOSIS — K810 Acute cholecystitis: Secondary | ICD-10-CM | POA: Diagnosis not present

## 2022-01-27 DIAGNOSIS — F05 Delirium due to known physiological condition: Secondary | ICD-10-CM

## 2022-01-27 DIAGNOSIS — G9341 Metabolic encephalopathy: Secondary | ICD-10-CM

## 2022-01-27 LAB — CBC
HCT: 36.2 % (ref 36.0–46.0)
Hemoglobin: 11.9 g/dL — ABNORMAL LOW (ref 12.0–15.0)
MCH: 32.9 pg (ref 26.0–34.0)
MCHC: 32.9 g/dL (ref 30.0–36.0)
MCV: 100 fL (ref 80.0–100.0)
Platelets: 166 10*3/uL (ref 150–400)
RBC: 3.62 MIL/uL — ABNORMAL LOW (ref 3.87–5.11)
RDW: 13.9 % (ref 11.5–15.5)
WBC: 14.4 10*3/uL — ABNORMAL HIGH (ref 4.0–10.5)
nRBC: 0 % (ref 0.0–0.2)

## 2022-01-27 LAB — COMPREHENSIVE METABOLIC PANEL
ALT: 200 U/L — ABNORMAL HIGH (ref 0–44)
AST: 116 U/L — ABNORMAL HIGH (ref 15–41)
Albumin: 3.6 g/dL (ref 3.5–5.0)
Alkaline Phosphatase: 120 U/L (ref 38–126)
Anion gap: 9 (ref 5–15)
BUN: 20 mg/dL (ref 8–23)
CO2: 21 mmol/L — ABNORMAL LOW (ref 22–32)
Calcium: 9.3 mg/dL (ref 8.9–10.3)
Chloride: 103 mmol/L (ref 98–111)
Creatinine, Ser: 1.36 mg/dL — ABNORMAL HIGH (ref 0.44–1.00)
GFR, Estimated: 37 mL/min — ABNORMAL LOW (ref >60)
Glucose, Bld: 114 mg/dL — ABNORMAL HIGH (ref 70–99)
Potassium: 3.6 mmol/L (ref 3.5–5.1)
Sodium: 133 mmol/L — ABNORMAL LOW (ref 135–145)
Total Bilirubin: 0.9 mg/dL (ref 0.3–1.2)
Total Protein: 6.2 g/dL — ABNORMAL LOW (ref 6.5–8.1)

## 2022-01-27 LAB — SURGICAL PATHOLOGY

## 2022-01-27 MED ORDER — HALOPERIDOL LACTATE 5 MG/ML IJ SOLN
1.0000 mg | Freq: Once | INTRAMUSCULAR | Status: AC
  Administered 2022-01-27: 1 mg via INTRAVENOUS
  Filled 2022-01-27: qty 1

## 2022-01-27 MED ORDER — POLYETHYLENE GLYCOL 3350 17 G PO PACK
17.0000 g | PACK | Freq: Two times a day (BID) | ORAL | Status: AC
  Administered 2022-01-28: 17 g via ORAL
  Filled 2022-01-27 (×4): qty 1

## 2022-01-27 NOTE — Plan of Care (Signed)
  Problem: Safety: Goal: Non-violent Restraint(s) Outcome: Progressing   Problem: Education: Goal: Knowledge of General Education information will improve Description: Including pain rating scale, medication(s)/side effects and non-pharmacologic comfort measures Outcome: Progressing   Problem: Activity: Goal: Risk for activity intolerance will decrease Outcome: Progressing   Problem: Nutrition: Goal: Adequate nutrition will be maintained Outcome: Progressing   Problem: Coping: Goal: Level of anxiety will decrease Outcome: Progressing   Problem: Pain Managment: Goal: General experience of comfort will improve Outcome: Progressing   Problem: Safety: Goal: Ability to remain free from injury will improve Outcome: Progressing

## 2022-01-27 NOTE — Progress Notes (Signed)
Occupational Therapy Treatment Patient Details Name: [name] MRN: [id number] DOB: 12/16/1931 Today's Date: 01/27/2022   History of present illness Pt is a 88 yr old female who presented 01/24/22 due to chest/abdominal pain . US showed gallstones and also found to have cholecystitis. S/P laparoscopic cholecystectomy 11/27. PMH includes breast CA, COPD, AVR, CKD, PAF, HTN, change in voice, L thalamic stroke, THA   OT comments  Pt making steady progress towards OT goals this session. Pt continues to present with decreased activity tolerance, impaired cognition, and impaired balance . Session focus on BADL reeducation, functional mobility and increasing overall activity tolerance. Pt currently requires MIN A for ADL transfers with Rw, and MIN A for 3/3 toileting tasks. Pt presents with cognitive deficits in Orientation, Attention, Following commands, Safety/judgement, Awareness, Problem solving. Pt would continue to benefit from skilled occupational therapy while admitted and after d/c to address the below listed limitations in order to improve overall functional mobility and facilitate independence with BADL participation. DC plan remains appropriate, will follow acutely per POC.      Recommendations for follow up therapy are one component of a multi-disciplinary discharge planning process, led by the attending physician.  Recommendations may be updated based on patient status, additional functional criteria and insurance authorization.    Follow Up Recommendations  Skilled nursing-short term rehab (<3 hours/day)     Assistance Recommended at Discharge Frequent or constant Supervision/Assistance  Patient can return home with the following  A lot of help with walking and/or transfers;A lot of help with bathing/dressing/bathroom;Assistance with cooking/housework;Direct supervision/assist for medications management;Direct supervision/assist for financial management;Assist for transportation    Equipment Recommendations  None recommended by OT (TBD at next level of care.)    Recommendations for Other Services      Precautions / Restrictions Precautions Precautions: Fall Restrictions Weight Bearing Restrictions: No       Mobility Bed Mobility Overal bed mobility: Needs Assistance Bed Mobility: Supine to Sit     Supine to sit: Mod assist, HOB elevated     General bed mobility comments: MOD A to scoot hips to EOB and elevate trunk into sitting    Transfers Overall transfer level: Needs assistance Equipment used: Rolling walker (2 wheels) Transfers: Sit to/from Stand, Bed to chair/wheelchair/BSC Sit to Stand: Min assist Stand pivot transfers: Min assist         General transfer comment: MIN A to rise from EOB and BSC, mild posterior lean when powering into standing from EOB and BSC. pt able to pivot with RW to [facility] and to recliner with cues for RW mgmt and sequencing/safety     Balance Overall balance assessment: Needs assistance Sitting-balance support: Feet supported, Bilateral upper extremity supported Sitting balance-Leahy Scale: Fair     Standing balance support: Bilateral upper extremity supported, Reliant on assistive device for balance Standing balance-Leahy Scale: Poor Standing balance comment: unsteady without UE support                           ADL either performed or assessed with clinical judgement   ADL Overall ADL's : Needs assistance/impaired     Grooming: Wash/dry hands;Sitting;Supervision/safety;Set up       Lower Body Bathing: Min guard;Sit to/from stand Lower Body Bathing Details (indicate cue type and reason): min guard for simulated LB bathing via pericare via sit>stand         Toilet Transfer: Minimal assistance;Stand-pivot;BSC/3in1;Rolling walker (2 wheels);Cueing for safety;Cueing for sequencing Toilet Transfer Details (  indicate cue type and reason): MIN A to stand pivot from EOB<>bsc with RW Toileting-  Clothing Manipulation and Hygiene: Min guard;Sit to/from stand Toileting - Clothing Manipulation Details (indicate cue type and reason): min guard for balance with unilateral support to complete anterior/posterior pericare     Functional mobility during ADLs: Minimal assistance;Rolling walker (2 wheels);Cueing for safety;Cueing for sequencing General ADL Comments: ADL participation impacted by impaired cognition, decreased activity tolerance and impaired balance    Extremity/Trunk Assessment Upper Extremity Assessment Upper Extremity Assessment: Generalized weakness   Lower Extremity Assessment Lower Extremity Assessment: Generalized weakness   Cervical / Trunk Assessment Cervical / Trunk Assessment: Kyphotic    Vision Baseline Vision/History: 1 Wears glasses     Perception Perception Perception: Within Functional Limits   Praxis Praxis Praxis: Intact    Cognition Arousal/Alertness: Awake/alert Behavior During Therapy: WFL for tasks assessed/performed Overall Cognitive Status: Impaired/Different from baseline Area of Impairment: Orientation, Attention, Following commands, Safety/judgement, Awareness, Problem solving                 Orientation Level: Disoriented to, Place, Time, Situation Current Attention Level: Focused   Following Commands: Follows one step commands with increased time Safety/Judgement: Decreased awareness of safety, Decreased awareness of deficits Awareness: Intellectual Problem Solving: Slow processing, Difficulty sequencing, Requires verbal cues General Comments: pt reports its february and its the 1900s. pt reports "be careful theres needles on the floor." pt with decreased initiation needing + time to follow commands        Exercises      Shoulder Instructions       General Comments pt with + urine void    Pertinent Vitals/ Pain       Pain Assessment Pain Assessment: Faces Faces Pain Scale: Hurts little more Pain Location:  general discomfort Pain Descriptors / Indicators: Guarding, Discomfort Pain Intervention(s): Monitored during session  Home Living                                          Prior Functioning/Environment              Frequency  Min 2X/week        Progress Toward Goals  OT Goals(current goals can now be found in the care plan section)  Progress towards OT goals: Progressing toward goals  Acute Rehab OT Goals Patient Stated Goal: none stated Time For Goal Achievement: 02/09/22 Potential to Achieve Goals: Good  Plan Discharge plan remains appropriate;Frequency remains appropriate    Co-evaluation                 AM-PAC OT "6 Clicks" Daily Activity     Outcome Measure   Help from another person eating meals?: A Little Help from another person taking care of personal grooming?: A Little Help from another person toileting, which includes using toliet, bedpan, or urinal?: A Lot Help from another person bathing (including washing, rinsing, drying)?: A Lot Help from another person to put on and taking off regular upper body clothing?: A Little Help from another person to put on and taking off regular lower body clothing?: A Lot 6 Click Score: 15    End of Session Equipment Utilized During Treatment: Gait belt;Rolling walker (2 wheels)  OT Visit Diagnosis: Unsteadiness on feet (R26.81);Other abnormalities of gait and mobility (R26.89);Muscle weakness (generalized) (M62.81);Pain   Activity Tolerance Patient tolerated treatment well   Patient Left  in chair;with call bell/phone within reach;with chair alarm set   Nurse Communication Mobility status;Other (comment) (urine void)        Time: 5364-6803 OT Time Calculation (min): 23 min  Charges: OT General Charges $OT Visit: 1 Visit OT Treatments $Self Care/Home Management : 23-37 mins  Harley Alto., COTA/L Acute Rehabilitation Services [phone]   Precious Haws 01/27/2022, 12:17  PM

## 2022-01-27 NOTE — Care Management Important Message (Signed)
Important Message  Patient Details  Name: [name] MRN: [id number] Date of Birth: September 12, 1931   Medicare Important Message Given:  Yes     Hannah Beat 01/27/2022, 11:00 AM

## 2022-01-27 NOTE — Progress Notes (Signed)
[facility] PROGRESS NOTE    Progress Note  [name]  DXA:[id number] DOB: December 04, 1931 DOA: 01/24/2022 PCP: Lauree Chandler, NP     Brief Narrative:   [name] is an 88 y.o. female past medical history significant for AVR, breast cancer COPD hypertension A-fib on Eliquis comes in for abdominal/chest pain, mainly right upper quadrant with nausea ultrasound showed acute cholecystitis General surgery was consulted as well as cardiology, lap chole was recommended for which she underwent on 01/25/2022 Hospital course has been complicated by intermittent delirium.  She is still confused in the mornings has not had a bowel movement.    Assessment/Plan:   Acute cholecystitis: Status post lap chole on 01/25/2022. LFTs continue to improve further management per surgery. Antibiotics have been discontinued, try to minimize narcotics. Leukocytosis is rising has remained afebrile repeat a CBC tomorrow morning. Remain afebrile.  Continue to monitor fever curve. Physical therapy evaluated the patient recommended skilled nursing facility.  Metabolic encephalopathy/acute confusional state: Haldol for agitation, avoid benzodiazepines and try to minimize narcotics. Melatonin at night. She is confused this morning.  Permanent atrial fibrillation: Continue metoprolol, okay to start Eliquis per surgery cardiology managing.  Essential hypertension: Blood pressure is controlled, there could be a variation due to pain. Continue metoprolol and irbesartan.  Chronic kidney disease stage IIIa: At baseline.  History of CVA/history of aortic valve stenosis with valve replacement: Noted.  DVT prophylaxis: Eliquis Family Communication:none Status is: Inpatient Remains inpatient appropriate because: Confused    Code Status:     Code Status Orders  (From admission, onward)           Start     Ordered   01/24/22 1005  Full code  Continuous        01/24/22 1005            Code Status History     Date Active Date Inactive Code Status Order ID Comments User Context   06/24/2020 0752 06/27/2020 2247 Full Code [id number]  Norval Morton, MD ED   06/20/2019 1612 06/23/2020 1906 Full Code [id number]  Lauree Chandler, NP Outpatient   06/20/2019 1605 06/20/2019 1612 DNR [id number]  Lauree Chandler, NP Outpatient   08/30/2012 1255 09/03/2012 1506 Full Code [id number]  Rexene Alberts, MD Inpatient         IV Access:   Peripheral IV   Procedures and diagnostic studies:   No results found.   Medical Consultants:   None.   Subjective:    [name] she relates that the nurses were meeting this morning she does not know where she is at.  Objective:    Vitals:   01/26/22 0440 01/26/22 0745 01/26/22 1621 01/27/22 0416  BP: (!) 146/72 138/69 138/67 (!) 154/87  Pulse: 98 86 70 77  Resp: '18 16 16 16  '$ Temp: 98.8 F (37.1 C) 98.7 F (37.1 C) 97.9 F (36.6 C)   TempSrc: Oral Oral Oral   SpO2: 98% 97% 99% 98%   SpO2: 98 % O2 Flow Rate (L/min): 2 L/min   Intake/Output Summary (Last 24 hours) at 01/27/2022 0658 Last data filed at 01/26/2022 1320 Gross per 24 hour  Intake 817.5 ml  Output --  Net 817.5 ml   There were no vitals filed for this visit.  Exam: General exam: In no acute distress. Respiratory system: Good air movement and clear to auscultation. Cardiovascular system: S1 & S2 heard, RRR. No JVD. Gastrointestinal system: Abdomen is nondistended, soft and  nontender.  Extremities: No pedal edema. Skin: No rashes, lesions or ulcers Psychiatry: No judgment or insight of medical condition, confused this morning   Data Reviewed:    Labs: Basic Metabolic Panel: Recent Labs  Lab 01/24/22 0438 01/25/22 0415 01/26/22 0725 01/27/22 0406  NA 144 138 140 133*  K 4.1 4.5 4.1 3.6  CL 105 107 108 103  CO2 '25 23 24 '$ 21*  GLUCOSE 114* 94 147* 114*  BUN '16 15 16 20  '$ CREATININE 1.26* 1.20* 1.15* 1.36*  CALCIUM 9.5 8.6* 9.0  9.3  MG 1.9  --   --   --    GFR CrCl cannot be calculated (Unknown ideal weight.). Liver Function Tests: Recent Labs  Lab 01/24/22 0438 01/25/22 0415 01/26/22 0725 01/27/22 0406  AST 146* 420* 194* 116*  ALT 79* 340* 247* 200*  ALKPHOS 82 148* 133* 120  BILITOT 1.2 1.2 1.0 0.9  PROT 5.8* 5.2* 5.6* 6.2*  ALBUMIN 3.6 3.2* 3.3* 3.6   Recent Labs  Lab 01/24/22 0438  LIPASE 117*   No results for input(s): "AMMONIA" in the last 168 hours. Coagulation profile No results for input(s): "INR", "PROTIME" in the last 168 hours. COVID-19 Labs  No results for input(s): "DDIMER", "FERRITIN", "LDH", "CRP" in the last 72 hours.  Lab Results  Component Value Date   SARSCOV2NAA NEGATIVE 06/24/2020   SARSCOV2NAA NOT DETECTED 01/12/2019    CBC: Recent Labs  Lab 01/24/22 0438 01/25/22 0415 01/26/22 0725 01/27/22 0406  WBC 9.2 4.8 9.5 14.4*  NEUTROABS 6.6  --   --   --   HGB 12.2 10.8* 11.3* 11.9*  HCT 36.4 32.4* 34.2* 36.2  MCV 99.7 101.6* 99.7 100.0  PLT 145* 120* 126* 166   Cardiac Enzymes: No results for input(s): "CKTOTAL", "CKMB", "CKMBINDEX", "TROPONINI" in the last 168 hours. BNP (last 3 results) No results for input(s): "PROBNP" in the last 8760 hours. CBG: No results for input(s): "GLUCAP" in the last 168 hours. D-Dimer: No results for input(s): "DDIMER" in the last 72 hours. Hgb A1c: No results for input(s): "HGBA1C" in the last 72 hours. Lipid Profile: No results for input(s): "CHOL", "HDL", "LDLCALC", "TRIG", "CHOLHDL", "LDLDIRECT" in the last 72 hours. Thyroid function studies: No results for input(s): "TSH", "T4TOTAL", "T3FREE", "THYROIDAB" in the last 72 hours.  Invalid input(s): "FREET3" Anemia work up: No results for input(s): "VITAMINB12", "FOLATE", "FERRITIN", "TIBC", "IRON", "RETICCTPCT" in the last 72 hours. Sepsis Labs: Recent Labs  Lab 01/24/22 0438 01/25/22 0415 01/26/22 0725 01/27/22 0406  WBC 9.2 4.8 9.5 14.4*   Microbiology No  results found for this or any previous visit (from the past 240 hour(s)).   Medications:    acetaminophen  650 mg Oral Q6H   Or   acetaminophen  650 mg Rectal Q6H   apixaban  5 mg Oral BID   feeding supplement  237 mL Oral BID BM   irbesartan  150 mg Oral Daily   lidocaine  1 patch Transdermal Q24H   metoprolol tartrate  25 mg Oral BID   Continuous Infusions:    LOS: 3 days   Charlynne Cousins  [facility]  01/27/2022, 6:58 AM

## 2022-01-27 NOTE — Progress Notes (Signed)
Progress Note  2 Days Post-Op  Subjective: Still confused but slightly improved from yesterday - remembers me from yesterday. Says she has been eating some. Having post operative abdominal pain   Objective: Vital signs in last 24 hours: Temp:  [97.9 F (36.6 C)-98.8 F (37.1 C)] 98.8 F (37.1 C) (11/29 0745) Pulse Rate:  [70-99] 99 (11/29 0745) Resp:  [16] 16 (11/29 0745) BP: (138-174)/(67-90) 174/90 (11/29 0745) SpO2:  [97 %-99 %] 97 % (11/29 0745) Last BM Date : 01/23/22  Intake/Output from previous day: 11/28 0701 - 11/29 0700 In: 817.5 [P.O.:240; I.V.:577.5] Out: -  Intake/Output this shift: No intake/output data recorded.  PE: General: pleasant, WD, female who is laying in bed in NAD Abd: soft, ND, appropriate TTP near incisions which are c/d/i Skin: warm and dry Psych: Alert and oriented to self and situation   Lab Results:  Recent Labs    01/26/22 0725 01/27/22 0406  WBC 9.5 14.4*  HGB 11.3* 11.9*  HCT 34.2* 36.2  PLT 126* 166    BMET Recent Labs    01/26/22 0725 01/27/22 0406  NA 140 133*  K 4.1 3.6  CL 108 103  CO2 24 21*  GLUCOSE 147* 114*  BUN 16 20  CREATININE 1.15* 1.36*  CALCIUM 9.0 9.3    PT/INR No results for input(s): "LABPROT", "INR" in the last 72 hours. CMP     Component Value Date/Time   NA 133 (L) 01/27/2022 0406   NA 140 02/25/2020 1459   K 3.6 01/27/2022 0406   CL 103 01/27/2022 0406   CO2 21 (L) 01/27/2022 0406   GLUCOSE 114 (H) 01/27/2022 0406   BUN 20 01/27/2022 0406   BUN 13 02/25/2020 1459   CREATININE 1.36 (H) 01/27/2022 0406   CREATININE 1.00 (H) 12/25/2021 1437   CALCIUM 9.3 01/27/2022 0406   PROT 6.2 (L) 01/27/2022 0406   PROT 6.0 12/02/2014 0945   ALBUMIN 3.6 01/27/2022 0406   ALBUMIN 3.9 12/02/2014 0945   AST 116 (H) 01/27/2022 0406   ALT 200 (H) 01/27/2022 0406   ALKPHOS 120 01/27/2022 0406   BILITOT 0.9 01/27/2022 0406   BILITOT 0.3 12/02/2014 0945   GFRNONAA 37 (L) 01/27/2022 0406   GFRNONAA  50 (L) 11/12/2019 1557   GFRAA 53 (L) 02/25/2020 1459   GFRAA 58 (L) 11/12/2019 1557   Lipase     Component Value Date/Time   LIPASE 117 (H) 01/24/2022 0438       Studies/Results: No results found.  Anti-infectives: Anti-infectives (From admission, onward)    Start     Dose/Rate Route Frequency Ordered Stop   01/24/22 1015  piperacillin-tazobactam (ZOSYN) IVPB 3.375 g  Status:  Discontinued        3.375 g 12.5 mL/hr over 240 Minutes Intravenous Every 8 hours 01/24/22 1005 01/26/22 1135   01/24/22 0830  cefTRIAXone (ROCEPHIN) 2 g in sodium chloride 0.9 % 100 mL IVPB        2 g 200 mL/hr over 30 Minutes Intravenous  Once 01/24/22 1950 01/24/22 0929        Assessment/Plan  Cholecystitis POD2 S/p lap chole 11/27 by Dr. Rosendo Gros Resumed eliquis - hgb stable Afebrile but with WBC of 14.4 - incisions look good. Does not need further abx post operatively for cholecystitis Multimodal pain control - minimize narcotics  Dispo: therapies recommending snf  FEN:  soft ID: [email or web address] completed VTE: eliquis    LOS: 3 days   Winferd Humphrey, [facility] Surgery 01/27/2022,  8:09 AM Please see Amion for pager number during day hours 7:00am-4:30pm

## 2022-01-27 NOTE — TOC Initial Note (Signed)
Transition of Care [facility]) - Initial/Assessment Note    Patient Details  Name: [name] MRN: [id number] Date of Birth: [birth year]  Transition of Care [facility] CM/SW Contact:    Benard Halsted, LCSW Phone Number: 01/27/2022, 9:24 AM  Clinical Narrative:                 CSW received consult for possible SNF placement at time of discharge. CSW spoke with patient's son. He reported that patient technically lives alone but he stays with her a lot. He expressed understanding of PT recommendation and requested time to discuss with family if she is able to return home versus SNF. CSW discussed insurance authorization process and will provide Medicare SNF ratings list if SNF route is chosen.   Skilled Nursing Rehab Facilities-   [email or web address]   Ratings out of 5 stars (5 the highest)   Name Address  Phone # [place] Inspection Overall  [facility] [address], [place] '4 5 2 3  '$ [facility]  [place], [place] [phone] '4 2 5 5  '$ [facility] [place], [place] '1 3 1 1  '$ [facility] [place], [place] '2 2 4 4  '$ [facility] [address] [place] '2 1 2 1  '$ [place] [address] [place] [phone] '3 3 4 4  '$ [facility] [address], [place] '4 1 3 2  '$ [facility] [address], [place] '4 1 3 2  '$ [address] ([place]) [place], [place] [phone] '3 1 2 1  '$ [facility] [address], [place] [phone] '3 1 1 1  '$ [facility] [address] [facility] [phone] '3 2 2 2  '$ [facility] [place]. [place], [place] [phone] '3 1 1 1  '$ [facility] [address] [place] [phone] '4 2 4 4          '$ [place] [address] [place] '4 1 3 2  '$ [facility] [place] [address], [place] '3 1 5 4  '$ [place] [address] [place] [address], [place] [phone] '1 1 2 1  '$ [facility] [address] [employer] [phone] '2 2 4 4          '$ [address] (no [facility]) [place] [address], [place] [phone] '5 5 5 5  '$ [facility] (No Humana) [address], [place] '4 1 4 3  '$ [facility] (No [employer]) [place], [place] '5 5 5 5  '$ [facility] [place] [address] [employer] [phone] '2 3 5 5  '$ [place] [address], [place] '1 1 2 1  '$ [place] [address], [place] [phone] '3 1 1 1  '$ [place], [place] '5 2 5 5  '$ [facility]  [address] [place] '2 2 1 1  '$ [facility] [address], [place] '3 2 1 1  '$ [facility] [place], [place] '2 2 2 2          '$ [facility] [address] [place] '1 1 1 1  '$ [place] [address], [place]  [phone] '2 4 3 3  '$ [facility] [place] [address], [place] [phone] '3 2 3 3  '$ [place], [place] '2 1 1 1  '$ [place] (No Humana) [address] [place] [phone] '2 2 3 3  '$ [facility]) [address], [place] [phone] '2 1 1 1          '$ [facility] [place], [place] '5 4 5 5  '$ [facility] ([place])  [address], [place] '2 1 2 1  '$ [facility]) [place] [address] [place] '3 1 4 3  '$ [place] [address] [place] '3 3 4 4  '$ [address] [place], [place] '2 3 1 1  '$ [facility]) [place] [phone] '2 1 4 3       '$ Barriers to Discharge: Continued Medical  Work up   Patient Goals and CMS Choice Patient states their goals for this hospitalization and ongoing recovery are:: Return home CMS Medicare.gov Compare Post Acute Care list provided to:: Patient Represenative (must comment) Choice offered to / list presented to : Adult Children  Expected Discharge Plan and Services   In-house Referral: Clinical Social Work     Living arrangements for the past 2 months: Single Family Home                                      Prior Living Arrangements/Services Living arrangements for the past 2 months: Single Family Home Lives with:: Adult Children, Self Patient language and need for interpreter reviewed:: Yes Do you feel safe going back to the place where you live?: Yes      Need for Family Participation in Patient Care: Yes (Comment) Care giver support system in place?: Yes (comment) Current home services: DME (shower seat/ 3 in 1/) Criminal Activity/Legal Involvement Pertinent to Current Situation/Hospitalization: No - Comment as needed  Activities of Daily Living      Permission Sought/Granted Permission sought to share information with : Facility Sport and exercise psychologist, Family Supports Permission granted to share information with : No  Share Information with NAME: [name]     Permission granted to share info w Relationship: Son  Permission granted to share info w Contact Information: [phone]  Emotional Assessment Appearance:: Appears stated age Attitude/Demeanor/Rapport: Unable to Assess Affect (typically observed): Unable to Assess Orientation: : Oriented to Self Alcohol / Substance Use: Not Applicable Psych Involvement: No (comment)  Admission diagnosis:  Acute cholecystitis [K81.0] Acute cholecystitis due to biliary calculus [K80.00] S/P laparoscopic cholecystectomy [M42.68] Acute metabolic encephalopathy [T41.96] Patient Active Problem List   Diagnosis Date Noted   Acute metabolic encephalopathy 22/29/7989    S/P laparoscopic cholecystectomy 01/25/2022   Dyslipidemia 01/24/2022   Peripheral vascular disease, unspecified ([place]) 12/25/2021   CVA (cerebral vascular accident) ([place]) 06/24/2020   Hypertensive urgency 06/24/2020   Cough variant asthma 01/22/2019   Upper airway cough syndrome 08/23/2018   Chronic right shoulder pain 06/15/2017   Shoulder arthritis 06/15/2017   CKD (chronic kidney disease) stage 3, GFR 30-59 ml/min ([place]) 09/08/2016   Carotid stenosis 05/05/2016   Injury of left ankle 03/11/2016   Elbow pain, right 10/10/2013   Cough 09/12/2013   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   Long term (current) use of anticoagulants 10/02/2012   Overactive bladder 09/11/2012   Acute blood loss anemia 09/11/2012   Weakness generalized 09/11/2012   Atrial fibrillation ([place]) 09/04/2012   S/P aortic valve replacement  with bioprosthetic valve 08/30/2012   Aortic valve stenosis, critical 08/28/2012   Normal coronary arteries-6/14 08/15/2012   DOE (dyspnea on exertion) 08/15/2012   Chest pain-exertional 08/15/2012   Aortic stenosis- critical AS at cath 08/14/12, with surgery 07/20/2012   History of total hip arthroplasty 06/29/2012   Unspecified constipation 06/06/2012   Insomnia 06/06/2012   Essential hypertension 06/06/2012   Osteoporosis 06/06/2012   GERD (gastroesophageal reflux disease) 06/06/2012   Laryngeal spasm 12/25/2010   History of breast cancer 09/29/1995   PCP:  Lauree Chandler, NP Pharmacy:   [facility] [address] [place] [address] Phone: [phone] Fax: [phone]     Social Determinants of Health (SDOH) Interventions    Readmission Risk Interventions     No data to display

## 2022-01-27 NOTE — Progress Notes (Signed)
Mobility Specialist - Progress Note   01/27/22 1503  Mobility  Activity Ambulated with assistance in hallway  Level of Assistance Contact guard assist, steadying assist  Assistive Device Front wheel walker  Distance Ambulated (ft) 200 ft  Activity Response Tolerated well  $Mobility charge 1 Mobility    Pt received in recliner agreeable to mobility. No complaints throughout. Left in recliner w/ call bell in reach and all needs met.   [place] Specialist Please contact via SecureChat or Rehab office at [phone]

## 2022-01-27 NOTE — Progress Notes (Signed)
Pt has refused all meds on day shift today. Meds held all day as this Probation officer continued to encourage pt to take throughout the day also anticipating son would convince pt to take meds upon arrival. Upon son's arrival, pt refused to communicate with son and continued to refused meds. Meds, therefore, wasted and Dr. Olevia Bowens made aware.

## 2022-01-28 DIAGNOSIS — G9341 Metabolic encephalopathy: Secondary | ICD-10-CM | POA: Diagnosis not present

## 2022-01-28 DIAGNOSIS — K810 Acute cholecystitis: Secondary | ICD-10-CM | POA: Diagnosis not present

## 2022-01-28 LAB — CBC WITH DIFFERENTIAL/PLATELET
Abs Immature Granulocytes: 0.09 10*3/uL — ABNORMAL HIGH (ref 0.00–0.07)
Basophils Absolute: 0 10*3/uL (ref 0.0–0.1)
Basophils Relative: 0 %
Eosinophils Absolute: 0 10*3/uL (ref 0.0–0.5)
Eosinophils Relative: 0 %
HCT: 33.6 % — ABNORMAL LOW (ref 36.0–46.0)
Hemoglobin: 11.3 g/dL — ABNORMAL LOW (ref 12.0–15.0)
Immature Granulocytes: 1 %
Lymphocytes Relative: 13 %
Lymphs Abs: 1.5 10*3/uL (ref 0.7–4.0)
MCH: 33 pg (ref 26.0–34.0)
MCHC: 33.6 g/dL (ref 30.0–36.0)
MCV: 98.2 fL (ref 80.0–100.0)
Monocytes Absolute: 1 10*3/uL (ref 0.1–1.0)
Monocytes Relative: 9 %
Neutro Abs: 9 10*3/uL — ABNORMAL HIGH (ref 1.7–7.7)
Neutrophils Relative %: 77 %
Platelets: 177 10*3/uL (ref 150–400)
RBC: 3.42 MIL/uL — ABNORMAL LOW (ref 3.87–5.11)
RDW: 13.9 % (ref 11.5–15.5)
WBC: 11.6 10*3/uL — ABNORMAL HIGH (ref 4.0–10.5)
nRBC: 0 % (ref 0.0–0.2)

## 2022-01-28 MED ORDER — HALOPERIDOL LACTATE 5 MG/ML IJ SOLN
INTRAMUSCULAR | Status: AC
  Administered 2022-01-28: 2 mg via INTRAMUSCULAR
  Filled 2022-01-28: qty 1

## 2022-01-28 MED ORDER — HALOPERIDOL LACTATE 5 MG/ML IJ SOLN: 2.0000 mg | Freq: Four times a day (QID) | INTRAMUSCULAR | Status: DC | PRN

## 2022-01-28 MED ORDER — QUETIAPINE FUMARATE 50 MG PO TABS: 25.0000 mg | ORAL_TABLET | Freq: Every evening | ORAL | Status: DC | PRN

## 2022-01-28 MED ORDER — ENOXAPARIN SODIUM 40 MG/0.4ML IJ SOSY
40.0000 mg | PREFILLED_SYRINGE | Freq: Once | INTRAMUSCULAR | Status: AC
  Administered 2022-01-28: 40 mg via SUBCUTANEOUS
  Filled 2022-01-28: qty 0.4

## 2022-01-28 MED ORDER — APIXABAN 5 MG PO TABS
5.0000 mg | ORAL_TABLET | Freq: Two times a day (BID) | ORAL | Status: DC
  Administered 2022-01-29: 5 mg via ORAL
  Filled 2022-01-28: qty 1

## 2022-01-28 MED ORDER — MELATONIN 3 MG PO TABS
3.0000 mg | ORAL_TABLET | Freq: Every day | ORAL | Status: DC
  Filled 2022-01-28: qty 1

## 2022-01-28 NOTE — Progress Notes (Signed)
[facility] PROGRESS NOTE    Progress Note  [name]  ZTI:[id number] DOB: Mar 09, 1931 DOA: 01/24/2022 PCP: Lauree Chandler, NP     Brief Narrative:   [name] is an 88 y.o. female past medical history significant for AVR, breast cancer COPD hypertension A-fib on Eliquis comes in for abdominal/chest pain, mainly right upper quadrant with nausea ultrasound showed acute cholecystitis General surgery was consulted as well as cardiology, lap chole was recommended for which she underwent on 01/25/2022 Hospital course has been complicated by intermittent delirium.  She is still confused in the mornings has not had a bowel movement.    Assessment/Plan:   Acute cholecystitis: Status post lap chole on 01/25/2022. LFTs continue to improve further management per surgery. Antibiotics have been discontinued, try to minimize narcotics. Cytosis is improved, has remained afebrile. Continue monitor fever curve. Physical therapy evaluated the patient recommended skilled nursing facility.  Metabolic encephalopathy/acute confusional state: Haldol for agitation, avoid benzodiazepines and try to minimize narcotics. Melatonin at night. Agitated and confused this morning we will have to give her Haldol IM. Use melatonin and Seroquel at night.  Permanent atrial fibrillation: Continue metoprolol, okay to start Eliquis per surgery cardiology managing.  Essential hypertension: Blood pressure is controlled, there could be a variation due to pain. Continue metoprolol and irbesartan.  Chronic kidney disease stage IIIa: At baseline.  History of CVA/history of aortic valve stenosis with valve replacement: Noted.  DVT prophylaxis: Eliquis Family Communication:none Status is: Inpatient Remains inpatient appropriate because: Confused    Code Status:     Code Status Orders  (From admission, onward)           Start     Ordered   01/24/22 1005  Full code  Continuous         01/24/22 1005           Code Status History     Date Active Date Inactive Code Status Order ID Comments User Context   06/24/2020 0752 06/27/2020 2247 Full Code [id number]  Norval Morton, MD ED   06/20/2019 1612 06/23/2020 1906 Full Code [id number]  Lauree Chandler, NP Outpatient   06/20/2019 1605 06/20/2019 1612 DNR [id number]  Lauree Chandler, NP Outpatient   08/30/2012 1255 09/03/2012 1506 Full Code [id number]  Rexene Alberts, MD Inpatient         IV Access:   Peripheral IV   Procedures and diagnostic studies:   No results found.   Medical Consultants:   None.   Subjective:    [name] agitated this morning confused. Objective:    Vitals:   01/27/22 1619 01/27/22 2110 01/27/22 2348 01/28/22 0545  BP: (!) 169/137 (!) 191/104 (!) 155/67 (!) 157/106  Pulse: (!) 108 91 96 (!) 109  Resp: 16 17    Temp: 98.9 F (37.2 C) 98.2 F (36.8 C)  98.8 F (37.1 C)  TempSrc: Oral Oral  Oral  SpO2: 97% 95%  97%   SpO2: 97 % O2 Flow Rate (L/min): 2 L/min  No intake or output data in the 24 hours ending 01/28/22 0819  There were no vitals filed for this visit.  Exam: General exam: In no acute distress. Respiratory system: Good air movement and clear to auscultation. Cardiovascular system: S1 & S2 heard, RRR. No JVD. Gastrointestinal system: Abdomen is nondistended, soft and nontender.  Extremities: No pedal edema. Skin: No rashes, lesions or ulcers Psychiatry: No judgment or insight of medical condition.   Data Reviewed:  Labs: Basic Metabolic Panel: Recent Labs  Lab 01/24/22 0438 01/25/22 0415 01/26/22 0725 01/27/22 0406  NA 144 138 140 133*  K 4.1 4.5 4.1 3.6  CL 105 107 108 103  CO2 '25 23 24 '$ 21*  GLUCOSE 114* 94 147* 114*  BUN '16 15 16 20  '$ CREATININE 1.26* 1.20* 1.15* 1.36*  CALCIUM 9.5 8.6* 9.0 9.3  MG 1.9  --   --   --     GFR CrCl cannot be calculated (Unknown ideal weight.). Liver Function Tests: Recent Labs  Lab  01/24/22 0438 01/25/22 0415 01/26/22 0725 01/27/22 0406  AST 146* 420* 194* 116*  ALT 79* 340* 247* 200*  ALKPHOS 82 148* 133* 120  BILITOT 1.2 1.2 1.0 0.9  PROT 5.8* 5.2* 5.6* 6.2*  ALBUMIN 3.6 3.2* 3.3* 3.6    Recent Labs  Lab 01/24/22 0438  LIPASE 117*    No results for input(s): "AMMONIA" in the last 168 hours. Coagulation profile No results for input(s): "INR", "PROTIME" in the last 168 hours. COVID-19 Labs  No results for input(s): "DDIMER", "FERRITIN", "LDH", "CRP" in the last 72 hours.  Lab Results  Component Value Date   [place] NEGATIVE 06/24/2020   SARSCOV2NAA NOT DETECTED 01/12/2019    CBC: Recent Labs  Lab 01/24/22 0438 01/25/22 0415 01/26/22 0725 01/27/22 0406 01/28/22 0534  WBC 9.2 4.8 9.5 14.4* 11.6*  NEUTROABS 6.6  --   --   --  9.0*  HGB 12.2 10.8* 11.3* 11.9* 11.3*  HCT 36.4 32.4* 34.2* 36.2 33.6*  MCV 99.7 101.6* 99.7 100.0 98.2  PLT 145* 120* 126* 166 177    Cardiac Enzymes: No results for input(s): "CKTOTAL", "CKMB", "CKMBINDEX", "TROPONINI" in the last 168 hours. BNP (last 3 results) No results for input(s): "PROBNP" in the last 8760 hours. CBG: No results for input(s): "GLUCAP" in the last 168 hours. D-Dimer: No results for input(s): "DDIMER" in the last 72 hours. Hgb A1c: No results for input(s): "HGBA1C" in the last 72 hours. Lipid Profile: No results for input(s): "CHOL", "HDL", "LDLCALC", "TRIG", "CHOLHDL", "LDLDIRECT" in the last 72 hours. Thyroid function studies: No results for input(s): "TSH", "T4TOTAL", "T3FREE", "THYROIDAB" in the last 72 hours.  Invalid input(s): "FREET3" Anemia work up: No results for input(s): "VITAMINB12", "FOLATE", "FERRITIN", "TIBC", "IRON", "RETICCTPCT" in the last 72 hours. Sepsis Labs: Recent Labs  Lab 01/25/22 0415 01/26/22 0725 01/27/22 0406 01/28/22 0534  WBC 4.8 9.5 14.4* 11.6*    Microbiology No results found for this or any previous visit (from the past 240  hour(s)).   Medications:    acetaminophen  650 mg Oral Q6H   Or   acetaminophen  650 mg Rectal Q6H   apixaban  5 mg Oral BID   feeding supplement  237 mL Oral BID BM   irbesartan  150 mg Oral Daily   lidocaine  1 patch Transdermal Q24H   metoprolol tartrate  25 mg Oral BID   polyethylene glycol  17 g Oral BID   Continuous Infusions:    LOS: 4 days   Charlynne Cousins  [facility]  01/28/2022, 8:19 AM

## 2022-01-29 DIAGNOSIS — N1831 Chronic kidney disease, stage 3a: Secondary | ICD-10-CM

## 2022-01-29 DIAGNOSIS — K8000 Calculus of gallbladder with acute cholecystitis without obstruction: Secondary | ICD-10-CM | POA: Diagnosis not present

## 2022-01-29 DIAGNOSIS — I10 Essential (primary) hypertension: Secondary | ICD-10-CM | POA: Diagnosis not present

## 2022-01-29 DIAGNOSIS — I4811 Longstanding persistent atrial fibrillation: Secondary | ICD-10-CM

## 2022-01-29 DIAGNOSIS — K810 Acute cholecystitis: Secondary | ICD-10-CM | POA: Diagnosis not present

## 2022-01-29 MED ORDER — METOPROLOL TARTRATE 5 MG/5ML IV SOLN: 5.0000 mg | Freq: Once | INTRAVENOUS | Status: DC

## 2022-01-29 MED ORDER — ACETAMINOPHEN 500 MG PO TABS: 1000.0000 mg | ORAL_TABLET | Freq: Four times a day (QID) | ORAL | Status: DC

## 2022-01-29 NOTE — TOC Initial Note (Signed)
Transition of Care (TOC) - Initial/Assessment Note  Spoke to patient and son [name] at bedside.   Confirmed face sheet information.   [name] wants to take patient home at discharge (today) . He plans on staying with her.   They have shower seat, walker and wheel chair at home already.   [facility] has had [facility] in the past and they would like [place] again. Tommi Rumps with [facility] accepted referral.   Patient Details  Name: [name] MRN: [id number] Date of Birth: 06/10/1931  Transition of Care [facility]) CM/SW Contact:    Marilu Favre, RN Phone Number: 01/29/2022, 8:28 AM  Clinical Narrative:                   Expected Discharge Plan: [place] Barriers to Discharge: No Barriers Identified   Patient Goals and CMS Choice Patient states their goals for this hospitalization and ongoing recovery are:: to return to home CMS Medicare.gov Compare Post Acute Care list provided to:: Patient Represenative (must comment) (son [name] at bedside) Choice offered to / list presented to : Adult Children  Expected Discharge Plan and Services Expected Discharge Plan: [place] In-house Referral: Clinical Social Work Discharge Planning Services: CM Consult Post Acute Care Choice: [place] arrangements for the past 2 months: [place] Expected Discharge Date: 01/29/22               DME Arranged: N/A DME Agency: NA       HH Arranged: PT, OT HH Agency: [place] Date HH Agency Contacted: 01/29/22 Time HH Agency Contacted: [phone] Representative spoke with at [place]: Tommi Rumps  Prior Living Arrangements/Services Living arrangements for the past 2 months: [place] with:: Self Patient language and need for interpreter reviewed:: Yes Do you feel safe going back to the place where you live?: Yes      Need for Family Participation in Patient Care: Yes (Comment) Care giver support system in place?: Yes  (comment) Current home services: DME Criminal Activity/Legal Involvement Pertinent to Current Situation/Hospitalization: No - Comment as needed  Activities of Daily Living      Permission Sought/Granted Permission sought to share information with : Facility Sport and exercise psychologist, Family Supports Permission granted to share information with : Yes, Verbal Permission Granted  Share Information with NAME: Tamzin Bertling son  Permission granted to share info w AGENCY: [facility]  Permission granted to share info w Relationship: Son  Permission granted to share info w Contact Information: [phone]  Emotional Assessment Appearance:: Appears stated age Attitude/Demeanor/Rapport: Unable to Assess Affect (typically observed): Unable to Assess Orientation: : Oriented to Self Alcohol / Substance Use: Not Applicable Psych Involvement: No (comment)  Admission diagnosis:  Acute cholecystitis [K81.0] Acute cholecystitis due to biliary calculus [K80.00] S/P laparoscopic cholecystectomy [I43.32] Acute metabolic encephalopathy [R51.88] Patient Active Problem List   Diagnosis Date Noted   Acute metabolic encephalopathy 41/66/0630   S/P laparoscopic cholecystectomy 01/25/2022   Dyslipidemia 01/24/2022   Peripheral vascular disease, unspecified ([place]) 12/25/2021   CVA (cerebral vascular accident) ([place]) 06/24/2020   Hypertensive urgency 06/24/2020   Cough variant asthma 01/22/2019   Upper airway cough syndrome 08/23/2018   Chronic right shoulder pain 06/15/2017   Shoulder arthritis 06/15/2017   CKD (chronic kidney disease) stage 3, GFR 30-59 ml/min ([place]) 09/08/2016   Carotid stenosis 05/05/2016   Injury of left ankle 03/11/2016   Elbow pain, right 10/10/2013   Cough 09/12/2013  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   Long term (current) use of anticoagulants 10/02/2012   Overactive bladder 09/11/2012   Acute blood loss anemia 09/11/2012   Weakness generalized 09/11/2012   Atrial fibrillation ([place])  09/04/2012   S/P aortic valve replacement with bioprosthetic valve 08/30/2012   Aortic valve stenosis, critical 08/28/2012   Normal coronary arteries-6/14 08/15/2012   DOE (dyspnea on exertion) 08/15/2012   Chest pain-exertional 08/15/2012   Aortic stenosis- critical AS at cath 08/14/12, with surgery 07/20/2012   History of total hip arthroplasty 06/29/2012   Unspecified constipation 06/06/2012   Insomnia 06/06/2012   Essential hypertension 06/06/2012   Osteoporosis 06/06/2012   GERD (gastroesophageal reflux disease) 06/06/2012   Laryngeal spasm 12/25/2010   History of breast cancer 09/29/1995   PCP:  Lauree Chandler, NP Pharmacy:   [facility] [address] [place] [address] Phone: [phone] Fax: [phone]     Social Determinants of Health (SDOH) Interventions    Readmission Risk Interventions     No data to display

## 2022-01-29 NOTE — Progress Notes (Signed)
Physical Therapy Treatment Patient Details Name: [name] MRN: [id number] DOB: March 24, 1931 Today's Date: 01/29/2022   History of Present Illness Pt is a 88 yr old female who presented 01/24/22 due to chest/abdominal pain . US showed gallstones and also found to have cholecystitis. S/P laparoscopic cholecystectomy 11/27. PMH includes breast CA, COPD, AVR, CKD, PAF, HTN, change in voice, L thalamic stroke, THA    PT Comments    Pt's son present and able to receive education. Able to be with pt 24/7 so changing d/c rec to home with HHPT. Pt to d/c later today. Pt continues to need min A for mobility but he will be able to give her that. PT will continue to follow.    Recommendations for follow up therapy are one component of a multi-disciplinary discharge planning process, led by the attending physician.  Recommendations may be updated based on patient status, additional functional criteria and insurance authorization.  Follow Up Recommendations  Home health PT Can patient physically be transported by private vehicle: Yes   Assistance Recommended at Discharge Frequent or constant Supervision/Assistance  Patient can return home with the following A little help with walking and/or transfers;A little help with bathing/dressing/bathroom;Assistance with cooking/housework;Help with stairs or ramp for entrance;Assist for transportation;Direct supervision/assist for financial management;Direct supervision/assist for medications management   Equipment Recommendations  None recommended by PT    Recommendations for Other Services       Precautions / Restrictions Precautions Precautions: Fall Restrictions Weight Bearing Restrictions: No     Mobility  Bed Mobility Overal bed mobility: Needs Assistance Bed Mobility: Rolling, Sidelying to Sit Rolling: Min assist Sidelying to sit: Min assist       General bed mobility comments: son educated on rolling to decrease pressure on abdomen and  then assisting pt with LE's off bed. Once in this position she is able to come to sitting with min A    Transfers Overall transfer level: Needs assistance Equipment used: Rolling walker (2 wheels) Transfers: Sit to/from Stand Sit to Stand: Min assist           General transfer comment: son given gait belt for home. Pt needs min A for power up    Ambulation/Gait Ambulation/Gait assistance: Min assist Gait Distance (Feet): 90 Feet Assistive device: Rolling walker (2 wheels) Gait Pattern/deviations: Step-through pattern, Decreased stride length Gait velocity: decreased Gait velocity interpretation: 1.31 - 2.62 ft/sec, indicative of limited community ambulator   General Gait Details: vc's for fwd gaze. Pt less distracted in hallway today and with quicker pace   Stairs             Wheelchair Mobility    Modified Rankin (Stroke Patients Only)       Balance Overall balance assessment: Needs assistance Sitting-balance support: Feet supported, Bilateral upper extremity supported Sitting balance-Leahy Scale: Fair     Standing balance support: Bilateral upper extremity supported, Reliant on assistive device for balance Standing balance-Leahy Scale: Poor Standing balance comment: unsteady without UE support                            Cognition Arousal/Alertness: Awake/alert Behavior During Therapy: WFL for tasks assessed/performed Overall Cognitive Status: History of cognitive impairments - at baseline                                 General Comments: son indicates pt is close to  baseline        Exercises      General Comments General comments (skin integrity, edema, etc.): VSS. Went over getting into house with steps with son. No further questions from pt or son      Pertinent Vitals/Pain Pain Assessment Pain Assessment: Faces Faces Pain Scale: Hurts a little bit Pain Location: abdomen Pain Descriptors / Indicators: Guarding,  Discomfort Pain Intervention(s): Monitored during session    Home Living                          Prior Function            PT Goals (current goals can now be found in the care plan section) Acute Rehab PT Goals Patient Stated Goal: none stated PT Goal Formulation: With patient Time For Goal Achievement: 02/09/22 Potential to Achieve Goals: Good Progress towards PT goals: Progressing toward goals    Frequency    Min 2X/week      PT Plan Discharge plan needs to be updated    Co-evaluation              AM-PAC PT "6 Clicks" Mobility   Outcome Measure  Help needed turning from your back to your side while in a flat bed without using bedrails?: A Little Help needed moving from lying on your back to sitting on the side of a flat bed without using bedrails?: A Little Help needed moving to and from a bed to a chair (including a wheelchair)?: A Little Help needed standing up from a chair using your arms (e.g., wheelchair or bedside chair)?: A Little Help needed to walk in hospital room?: A Little Help needed climbing 3-5 steps with a railing? : A Lot 6 Click Score: 17    End of Session Equipment Utilized During Treatment: Gait belt Activity Tolerance: Patient tolerated treatment well Patient left: in chair;with call bell/phone within reach;with nursing/sitter in room;with family/visitor present Nurse Communication: Mobility status PT Visit Diagnosis: Unsteadiness on feet (R26.81);Difficulty in walking, not elsewhere classified (R26.2);Pain Pain - part of body:  (abdomen)     Time: 6063-0160 PT Time Calculation (min) (ACUTE ONLY): 26 min  Charges:  $Gait Training: 8-22 mins $Therapeutic Activity: 8-22 mins                     Leighton Roach, PT  Acute Rehab Services Secure chat preferred Office [place] 01/29/2022, 10:50 AM

## 2022-01-29 NOTE — Discharge Summary (Signed)
Physician Discharge Summary  [name] BUL:[id number] DOB: Apr 09, 1931 DOA: 01/24/2022  PCP: Lauree Chandler, NP  Admit date: 01/24/2022 Discharge date: 01/29/2022  Admitted From: Home Disposition:  Home  Recommendations for Outpatient Follow-up:  Follow up with PCP in 1-2 weeks Please obtain BMP/CBC in one week   Home Health:Yes Equipment/Devices:None  Discharge Condition:Stable CODE STATUS:Full Diet recommendation: Heart Healthy  Brief/Interim Summary: 88 y.o. female past medical history significant for AVR, breast cancer COPD hypertension A-fib on Eliquis comes in for abdominal/chest pain, mainly right upper quadrant with nausea ultrasound showed acute cholecystitis General surgery was consulted as well as cardiology, lap chole was recommended for which she underwent on 01/25/2022 Hospital course has been complicated by intermittent delirium.  She is still confused in the mornings has not had a bowel movement.   Discharge Diagnoses:  Active Problems:   Essential hypertension   S/P aortic valve replacement with bioprosthetic valve   Atrial fibrillation (HCC)   CKD (chronic kidney disease) stage 3, GFR 30-59 ml/min (HCC)   CVA (cerebral vascular accident) ([place])   Dyslipidemia   S/P laparoscopic cholecystectomy   Acute metabolic encephalopathy  Acute cholecystitis: She was started empiric antibiotics, general surgery was consulted recommended surgical intervention performed on 01/25/2022 lap chole. She completed her antibiotic course in-house she was able to tolerate her diet. Physical therapy evaluated the patient, we recommended skilled nursing facility but the patient and the family adamantly refused. She will go home with home health.  Acute confusional state: Likely due to narcotics these were stopped she was started on melatonin this resolved. During her episode of confusion she was refusing her meds.  Permanent atrial fibrillation: No changes made to her  medication she will continue metoprolol and Eliquis.  Essential hypertension: No changes made to her medication continue current regimen.  Chronic kidney disease stage IIIa: Her creatinine remained at baseline.  History of CVA: Noted.    Discharge Instructions  Discharge Instructions     Diet - low sodium heart healthy   Complete by: As directed    Increase activity slowly   Complete by: As directed    No wound care   Complete by: As directed       Allergies as of 01/29/2022       Reactions   Lisinopril Cough   Ceftriaxone Rash   Codeine Rash   All over the body.        Medication List     TAKE these medications    acetaminophen 650 MG CR tablet Commonly known as: TYLENOL Take 1 tablet (650 mg total) by mouth 2 (two) times daily.   atorvastatin 40 MG tablet Commonly known as: LIPITOR Take 1 tablet (40 mg total) by mouth daily.   CITRACAL + D PO Take 1 tablet by mouth daily.   diclofenac Sodium 1 % Gel Commonly known as: Voltaren Apply 4 g topically 4 (four) times daily as needed. What changed: reasons to take this   Eliquis 5 MG Tabs tablet Generic drug: apixaban Take 1 tablet (5 mg total) by mouth 2 (two) times daily.   metoprolol tartrate 25 MG tablet Commonly known as: LOPRESSOR Take 1 tablet (25 mg total) by mouth 2 (two) times daily.   Myrbetriq 50 MG Tb24 tablet Generic drug: mirabegron ER Take 1 tablet (50 mg total) by mouth daily.   PHILLIPS MILK OF MAGNESIA PO Take 15 mLs by mouth at bedtime. Take daily at 6 o'clock at night   valsartan 160 MG tablet  Commonly known as: DIOVAN Take 1 tablet (160 mg total) by mouth in the morning.   valsartan 80 MG tablet Commonly known as: DIOVAN Take 1 tablet (80 mg total) by mouth every evening.   vitamin C 100 MG tablet Take 100 mg by mouth daily.        Follow-up Information     Maczis, Carlena Hurl, [place]. Go on 02/15/2022.   Specialty: General Surgery Why: follow up on 02/15/22 at  1:30 pm. Please arrive 30 minutes early to complete check in, and bring photo ID and insurance card. Contact information: [place] [address] [phone]                Allergies  Allergen Reactions   Lisinopril Cough   Ceftriaxone Rash   Codeine Rash    All over the body.    Consultations: General surgery   Procedures/Studies: US Abdomen Limited RUQ (LIVER/GB)  Result Date: 01/24/2022 CLINICAL DATA:  Right upper quadrant pain EXAM: ULTRASOUND ABDOMEN LIMITED RIGHT UPPER QUADRANT COMPARISON:  11/22/2017. FINDINGS: Gallbladder: Gallstones are noted measuring up to 1.5 cm. Mild gallbladder wall thickening measures 3.5 mm. Pericholecystic fluid noted. No sonographic Murphy's sign reported by the sonographer. Common bile duct: Diameter: 3 mm Liver: Increased parenchymal echogenicity. No focal liver lesion. Portal vein is patent on color Doppler imaging with normal direction of blood flow towards the liver. Other: Trace perihepatic ascites. IMPRESSION: 1. Gallstones and gallbladder wall thickening. Pericholecystic fluid noted. No sonographic Murphy's sign reported by the sonographer. Cannot exclude acute cholecystitis. If there is a high clinical suspicion for acute cholecystitis consider further evaluation with HIDA scan. 2. Hepatic steatosis. 3. Trace perihepatic ascites. Electronically Signed   By: Kerby Moors M.D.   On: 01/24/2022 07:47   DG Chest Portable 1 View  Result Date: 01/24/2022 CLINICAL DATA:  88 year old female with bradycardia, chest and abdominal pain. EXAM: PORTABLE CHEST 1 VIEW COMPARISON:  Chest radiographs 06/24/2020 and earlier. FINDINGS: Portable AP view at 0453 hours. Chronic cardiomegaly, cardiac valve replacement. Stable lung volumes and mediastinal contours when allowing for portable technique. Stable pulmonary vascularity, no overt edema. However, evidence of a small new right pleural effusion at  the costophrenic angle. No pneumothorax or consolidation. No acute osseous abnormality identified. Paucity of bowel gas in the upper abdomen. Calcified aortic atherosclerosis. IMPRESSION: 1. Evidence of a small right pleural effusion, new from last year. No overt edema. 2. Chronic cardiomegaly.  Aortic Atherosclerosis (ICD10-I70.0). Electronically Signed   By: Genevie Ann M.D.   On: 01/24/2022 05:30   DG Shoulder Left  Result Date: 01/10/2022 CLINICAL DATA:  Left shoulder pain. EXAM: LEFT SHOULDER - 2 VIEW COMPARISON:  Left shoulder radiographs 06/26/2020 FINDINGS: Progressive advanced degenerative changes are present at the left glenohumeral and AC joint. No acute or healing osseous abnormality is present. Heart is enlarged. Atherosclerotic changes are present at the aortic arch. Visualized left hemithorax is clear. IMPRESSION: Progressive advanced degenerative changes at the left glenohumeral and AC joint. Electronically Signed   By: San Morelle M.D.   On: 01/10/2022 08:16   DG Humerus Left  Result Date: 01/10/2022 CLINICAL DATA:  Left arm pain. EXAM: LEFT HUMERUS - 2+ VIEW COMPARISON:  Left shoulder radiographs 06/26/2020 FINDINGS: Progressive degenerative changes are present at the glenohumeral and AC joint. No acute bone or soft tissue abnormalities are present. The elbow is unremarkable. Heart is enlarged. Atherosclerotic changes are present at the aortic arch. The visualized hemithorax is  clear. IMPRESSION: 1. Progressive degenerative changes at the glenohumeral and AC joint. 2. No acute abnormality. Electronically Signed   By: San Morelle M.D.   On: 01/10/2022 08:15   (Echo, Carotid, EGD, Colonoscopy, ERCP)    Subjective: No complaints  Discharge Exam: Vitals:   01/28/22 1950 01/29/22 0601  BP: (!) 155/84 (!) 150/101  Pulse: 84 (!) 126  Resp:  18  Temp: 98.4 F (36.9 C) 99.4 F (37.4 C)  SpO2: 98% 98%   Vitals:   01/28/22 1813 01/28/22 1816 01/28/22 1950 01/29/22  0601  BP: (!) 144/101 (!) 144/101 (!) 155/84 (!) 150/101  Pulse: 99 99 84 (!) 126  Resp:    18  Temp: 98 F (36.7 C)  98.4 F (36.9 C) 99.4 F (37.4 C)  TempSrc: Oral  Axillary Oral  SpO2: 96% 96% 98% 98%  Weight:        General: Pt is alert, awake, not in acute distress Cardiovascular: RRR, S1/S2 +, no rubs, no gallops Respiratory: CTA bilaterally, no wheezing, no rhonchi Abdominal: Soft, NT, ND, bowel sounds + Extremities: no edema, no cyanosis    The results of significant diagnostics from this hospitalization (including imaging, microbiology, ancillary and laboratory) are listed below for reference.     Microbiology: No results found for this or any previous visit (from the past 240 hour(s)).   Labs: BNP (last 3 results) No results for input(s): "BNP" in the last 8760 hours. Basic Metabolic Panel: Recent Labs  Lab 01/24/22 0438 01/25/22 0415 01/26/22 0725 01/27/22 0406  NA 144 138 140 133*  K 4.1 4.5 4.1 3.6  CL 105 107 108 103  CO2 '25 23 24 '$ 21*  GLUCOSE 114* 94 147* 114*  BUN '16 15 16 20  '$ CREATININE 1.26* 1.20* 1.15* 1.36*  CALCIUM 9.5 8.6* 9.0 9.3  MG 1.9  --   --   --    Liver Function Tests: Recent Labs  Lab 01/24/22 0438 01/25/22 0415 01/26/22 0725 01/27/22 0406  AST 146* 420* 194* 116*  ALT 79* 340* 247* 200*  ALKPHOS 82 148* 133* 120  BILITOT 1.2 1.2 1.0 0.9  PROT 5.8* 5.2* 5.6* 6.2*  ALBUMIN 3.6 3.2* 3.3* 3.6   Recent Labs  Lab 01/24/22 0438  LIPASE 117*   No results for input(s): "AMMONIA" in the last 168 hours. CBC: Recent Labs  Lab 01/24/22 0438 01/25/22 0415 01/26/22 0725 01/27/22 0406 01/28/22 0534  WBC 9.2 4.8 9.5 14.4* 11.6*  NEUTROABS 6.6  --   --   --  9.0*  HGB 12.2 10.8* 11.3* 11.9* 11.3*  HCT 36.4 32.4* 34.2* 36.2 33.6*  MCV 99.7 101.6* 99.7 100.0 98.2  PLT 145* 120* 126* 166 177   Cardiac Enzymes: No results for input(s): "CKTOTAL", "CKMB", "CKMBINDEX", "TROPONINI" in the last 168 hours. BNP: Invalid  input(s): "POCBNP" CBG: No results for input(s): "GLUCAP" in the last 168 hours. D-Dimer No results for input(s): "DDIMER" in the last 72 hours. Hgb A1c No results for input(s): "HGBA1C" in the last 72 hours. Lipid Profile No results for input(s): "CHOL", "HDL", "LDLCALC", "TRIG", "CHOLHDL", "LDLDIRECT" in the last 72 hours. Thyroid function studies No results for input(s): "TSH", "T4TOTAL", "T3FREE", "THYROIDAB" in the last 72 hours.  Invalid input(s): "FREET3" Anemia work up No results for input(s): "VITAMINB12", "FOLATE", "FERRITIN", "TIBC", "IRON", "RETICCTPCT" in the last 72 hours. Urinalysis    Component Value Date/Time   COLORURINE YELLOW 06/23/2020 1923   APPEARANCEUR CLEAR 06/23/2020 1923   LABSPEC 1.011 06/23/2020 1923  PHURINE 7.0 06/23/2020 1923   GLUCOSEU NEGATIVE 06/23/2020 1923   HGBUR NEGATIVE 06/23/2020 1923   BILIRUBINUR NEGATIVE 06/23/2020 1923   KETONESUR NEGATIVE 06/23/2020 1923   PROTEINUR NEGATIVE 06/23/2020 1923   UROBILINOGEN 0.2 08/28/2012 1523   NITRITE NEGATIVE 06/23/2020 1923   LEUKOCYTESUR LARGE (A) 06/23/2020 1923   Sepsis Labs Recent Labs  Lab 01/25/22 0415 01/26/22 0725 01/27/22 0406 01/28/22 0534  WBC 4.8 9.5 14.4* 11.6*   Microbiology No results found for this or any previous visit (from the past 240 hour(s)).    SIGNED:   Charlynne Cousins, MD  [facility] 01/29/2022, 8:05 AM Pager   If 7PM-7AM, please contact night-coverage [email or web address] Password [email or web address]

## 2022-01-31 DIAGNOSIS — N1831 Chronic kidney disease, stage 3a: Secondary | ICD-10-CM | POA: Diagnosis not present

## 2022-01-31 DIAGNOSIS — K760 Fatty (change of) liver, not elsewhere classified: Secondary | ICD-10-CM | POA: Diagnosis not present

## 2022-01-31 DIAGNOSIS — J449 Chronic obstructive pulmonary disease, unspecified: Secondary | ICD-10-CM | POA: Diagnosis not present

## 2022-01-31 DIAGNOSIS — I4821 Permanent atrial fibrillation: Secondary | ICD-10-CM | POA: Diagnosis not present

## 2022-01-31 DIAGNOSIS — E785 Hyperlipidemia, unspecified: Secondary | ICD-10-CM | POA: Diagnosis not present

## 2022-01-31 DIAGNOSIS — Z853 Personal history of malignant neoplasm of breast: Secondary | ICD-10-CM | POA: Diagnosis not present

## 2022-01-31 DIAGNOSIS — M19012 Primary osteoarthritis, left shoulder: Secondary | ICD-10-CM | POA: Diagnosis not present

## 2022-01-31 DIAGNOSIS — Z7901 Long term (current) use of anticoagulants: Secondary | ICD-10-CM | POA: Diagnosis not present

## 2022-01-31 DIAGNOSIS — Z953 Presence of xenogenic heart valve: Secondary | ICD-10-CM | POA: Diagnosis not present

## 2022-01-31 DIAGNOSIS — G9341 Metabolic encephalopathy: Secondary | ICD-10-CM | POA: Diagnosis not present

## 2022-01-31 DIAGNOSIS — I700 Atherosclerosis of aorta: Secondary | ICD-10-CM | POA: Diagnosis not present

## 2022-01-31 DIAGNOSIS — Z9181 History of falling: Secondary | ICD-10-CM | POA: Diagnosis not present

## 2022-01-31 DIAGNOSIS — J90 Pleural effusion, not elsewhere classified: Secondary | ICD-10-CM | POA: Diagnosis not present

## 2022-01-31 DIAGNOSIS — I69354 Hemiplegia and hemiparesis following cerebral infarction affecting left non-dominant side: Secondary | ICD-10-CM | POA: Diagnosis not present

## 2022-01-31 DIAGNOSIS — I1310 Hypertensive heart and chronic kidney disease without heart failure, with stage 1 through stage 4 chronic kidney disease, or unspecified chronic kidney disease: Secondary | ICD-10-CM | POA: Diagnosis not present

## 2022-01-31 DIAGNOSIS — R188 Other ascites: Secondary | ICD-10-CM | POA: Diagnosis not present

## 2022-01-31 DIAGNOSIS — Z48815 Encounter for surgical aftercare following surgery on the digestive system: Secondary | ICD-10-CM | POA: Diagnosis not present

## 2022-02-01 ENCOUNTER — Telehealth: Payer: Self-pay

## 2022-02-01 NOTE — Telephone Encounter (Signed)
Incoming call received from [place] with [facility] requesting verbal orders for continuation of physical therapy   3 week 1 2 week 4 1 week 4  Per [facility] standing order, verbal order given. Message will be sent to patient's provider as a FYI.

## 2022-02-02 DIAGNOSIS — N1831 Chronic kidney disease, stage 3a: Secondary | ICD-10-CM | POA: Diagnosis not present

## 2022-02-02 DIAGNOSIS — I4821 Permanent atrial fibrillation: Secondary | ICD-10-CM | POA: Diagnosis not present

## 2022-02-02 DIAGNOSIS — I69354 Hemiplegia and hemiparesis following cerebral infarction affecting left non-dominant side: Secondary | ICD-10-CM | POA: Diagnosis not present

## 2022-02-02 DIAGNOSIS — J449 Chronic obstructive pulmonary disease, unspecified: Secondary | ICD-10-CM | POA: Diagnosis not present

## 2022-02-02 DIAGNOSIS — Z48815 Encounter for surgical aftercare following surgery on the digestive system: Secondary | ICD-10-CM | POA: Diagnosis not present

## 2022-02-02 DIAGNOSIS — I1310 Hypertensive heart and chronic kidney disease without heart failure, with stage 1 through stage 4 chronic kidney disease, or unspecified chronic kidney disease: Secondary | ICD-10-CM | POA: Diagnosis not present

## 2022-02-03 DIAGNOSIS — I1310 Hypertensive heart and chronic kidney disease without heart failure, with stage 1 through stage 4 chronic kidney disease, or unspecified chronic kidney disease: Secondary | ICD-10-CM | POA: Diagnosis not present

## 2022-02-03 DIAGNOSIS — I4821 Permanent atrial fibrillation: Secondary | ICD-10-CM | POA: Diagnosis not present

## 2022-02-03 DIAGNOSIS — I69354 Hemiplegia and hemiparesis following cerebral infarction affecting left non-dominant side: Secondary | ICD-10-CM | POA: Diagnosis not present

## 2022-02-03 DIAGNOSIS — J449 Chronic obstructive pulmonary disease, unspecified: Secondary | ICD-10-CM | POA: Diagnosis not present

## 2022-02-03 DIAGNOSIS — Z48815 Encounter for surgical aftercare following surgery on the digestive system: Secondary | ICD-10-CM | POA: Diagnosis not present

## 2022-02-03 DIAGNOSIS — N1831 Chronic kidney disease, stage 3a: Secondary | ICD-10-CM | POA: Diagnosis not present

## 2022-02-08 DIAGNOSIS — I4821 Permanent atrial fibrillation: Secondary | ICD-10-CM | POA: Diagnosis not present

## 2022-02-08 DIAGNOSIS — I1310 Hypertensive heart and chronic kidney disease without heart failure, with stage 1 through stage 4 chronic kidney disease, or unspecified chronic kidney disease: Secondary | ICD-10-CM | POA: Diagnosis not present

## 2022-02-08 DIAGNOSIS — Z48815 Encounter for surgical aftercare following surgery on the digestive system: Secondary | ICD-10-CM | POA: Diagnosis not present

## 2022-02-08 DIAGNOSIS — J449 Chronic obstructive pulmonary disease, unspecified: Secondary | ICD-10-CM | POA: Diagnosis not present

## 2022-02-08 DIAGNOSIS — I69354 Hemiplegia and hemiparesis following cerebral infarction affecting left non-dominant side: Secondary | ICD-10-CM | POA: Diagnosis not present

## 2022-02-08 DIAGNOSIS — N1831 Chronic kidney disease, stage 3a: Secondary | ICD-10-CM | POA: Diagnosis not present

## 2022-02-09 NOTE — Progress Notes (Deleted)
Cardiology Clinic Note   Patient Name: [name] Date of Encounter: 02/09/2022  Primary Care Provider:  Lauree Chandler, NP Primary Cardiologist:  None  Patient Profile    [name] is an 88 y.o. female with a past medical history of aortic stenosis s/p bioprosthetic tissue valve replacement July 2014, permanent afib on anticoagulation, carotid stenosis, hypertension, hyperlipidemia, CVA who presents to the clinic today for hospital follow-up.   Past Medical History    Past Medical History:  Diagnosis Date   Aortic stenosis 07/20/2012   Aortic stenosis    Arthritis    Cancer (HCC)    Breast   Carotid bruit 08/06/2008   Doppler - R ECA demonstrates noarrowing w/ elevated velocities consistent w/ >70% diameter reduction; R and L ICAs show no evidence of diameter reduction, significant tortuosity or vascular abnormality;    COPD (chronic obstructive pulmonary disease) (HCC)    Hearing loss    Heart murmur    Hyperlipidemia    Hypertension    dr [name]   Osteoporosis    PAF (paroxysmal atrial fibrillation) ([place]) 09/04/2012   PVD (peripheral vascular disease) ([place]) 08/13/2010   R/P MV - normal pattern of perfusion in all regions, EF 76%; no significant wall abnormalities noted; normal perfusion study   Right carotid bruit    high-grade right external carotid artery stenosis by 2 parts ultrasound 2 years ago   S/P aortic valve replacement with bioprosthetic valve 08/30/2012   21 mm [facility] Ease bovine pericardial tissue valve   Stroke (cerebrum) [facility])    Past Surgical History:  Procedure Laterality Date   ABDOMINAL HYSTERECTOMY     Partial   AORTIC VALVE REPLACEMENT N/A 08/30/2012   Procedure: AORTIC VALVE REPLACEMENT (AVR);  Surgeon: Bonnie Alberts, MD;  Location: [place];  Service: Open Heart Surgery;  Laterality: N/A;   BIOPSY SHOULDER Left 10/08/2005   shave biopsy   BREAST LUMPECTOMY  02/25/1997   right   CARDIAC CATHETERIZATION     CHOLECYSTECTOMY  N/A 01/25/2022   Procedure: LAPAROSCOPIC CHOLECYSTECTOMY;  Surgeon: Bonnie Ok, MD;  Location: [place];  Service: General;  Laterality: N/A;   [place]   Cataract surgery   INTRAOPERATIVE TRANSESOPHAGEAL ECHOCARDIOGRAM N/A 08/30/2012   Procedure: INTRAOPERATIVE TRANSESOPHAGEAL ECHOCARDIOGRAM;  Surgeon: Bonnie Alberts, MD;  Location: [place];  Service: Open Heart Surgery;  Laterality: N/A;   LEFT AND RIGHT HEART CATHETERIZATION WITH CORONARY ANGIOGRAM N/A 08/14/2012   Procedure: LEFT AND RIGHT HEART CATHETERIZATION WITH CORONARY ANGIOGRAM;  Surgeon: Lorretta Harp, MD;  Location: [facility] CATH LAB;  Service: Cardiovascular;  Laterality: N/A;   LEFT HEART CATH  08/14/12   Nl cors, AS   MASTECTOMY  09/29/95   left   PARTIAL HYSTERECTOMY     TOTAL HIP ARTHROPLASTY  09/2010    Allergies  Allergies  Allergen Reactions   Lisinopril Cough   Ceftriaxone Rash   Codeine Rash    All over the body.    History of Present Illness    [name] has a past medical history of: Aortic stenosis s/p valve replacement. Echo 07/18/2012: EF 65-70%. Moderate LVH. Grade I DD. Moderately calcified, thickened annulus. Mild MR. Mildly to moderately dilated left atrium. Aortic valve mildly to moderately calcified annulus, moderately calcified leaflets, moderate focal thickening/calcification right coronary and noncoronary cusp, reduced valve mobility, critical stenosis, trivial regurgitation.  LHC  08/14/2012: Normal coronaries. Critical aortic stenosis. Valve replacement 08/30/2012: Bioprosthetic tissue valve.  Echo 10/15/2014: EF 60-65%. Mild concentric LVH. Grade I DD. Severely dilated left atrium. Mildly dilated RV. Mildly dilated right atrium. Well-seated bioprosthetic aortic valve. No AR.  Echo 07/31/2021: EF 60-65%. Mild LVH. Moderately dilated left atrium. Mildly dilated right atrium. Moderate TR. Normal structure and function of  bioprosthetic aortic valve.  Permanent afib.  First occurrence post valve replacement July 2014. Carotid stenosis.  Carotid duplex US 07/24/2021: Bilateral ICA 1-39% stenosis. Right ECA >50%. Left CCA >50%. Left subclavian artery stenotic.   Hypertension.  Hyperlipidemia.  Lipid panel 07/03/2021: LDL 65, HDL 60, TG 70, total 140. CVA.   [name] is a long time patient of Dr. Gwenlyn Carpenter. She has a history of critical aortic stenosis s/p valve replacement with bioprosthetic tissue valve July 2014.  Yearly echo shows normal structure and function of bioprosthetic valve.  LHC June 2014 showed normal coronaries.  Patient was last seen in the office on 07/15/2021 by Dr. Gwenlyn Carpenter.  At that visit she was doing well and no changes were made.  Most recently, she underwent hospital admission for abdominal pain on 01/24/2022.  She had right upper quadrant pain and LFTs/lipase.  Right upper quadrant ultrasound showed gallstones and wall thickening.  Surgery was recommended.  Cardiology was consulted to clear patient for laparoscopic cholecystectomy after [name] washout.  Patient was Carpenter to be stable from cardiac standpoint no further cardiac workup is planned.  Patient underwent surgery on 01/25/2022.  Her hospital course was complicated by intermittent delirium.  She was discharged home on 01/29/2022.  Today, patient ***  Permanent afib. Patient on [name] 5 mg bid. Patient does not meet [name] dose adjustment requirements. Patient *** Continue [name] and metoprolol.  Aortic stenosis s/p aortic valve replacement 2014. Echo June 2023 showed aortic valve normal in function and structure. Patient *** Echo to be repeated June 2024.  Hypertension. BP today ***. She denies headaches or dizziness. Continue metoprolol and valsartan.  Hyperlipidemia. LDL 07/03/2021 65, at goal. Continue atorvastatin.   Home Medications    No outpatient medications have been marked as taking for the 02/11/22 encounter (Appointment) with  Ledora Bottcher, [place].    Family History    Family History  Problem Relation Age of Onset   Cancer Mother        Bladder   Early death Father        Tractor accident   Early death Brother        House fire   Early death Brother        during heart surgery   She indicated that her mother is deceased. She indicated that her father is deceased. She indicated that her sister is deceased. She indicated that both of her brothers are deceased. She indicated that her daughter is alive. She indicated that her son is alive.   Social History    Social History   Socioeconomic History   Marital status: Widowed    Spouse name: Not on file   Number of children: 2   Years of education: 9   Highest education level: Not on file  Occupational History   Occupation: retired  Tobacco Use   Smoking status: Never   Smokeless tobacco: Never  Vaping Use   Vaping Use: Never used  Substance and Sexual Activity   Alcohol use: No   Drug use: No   Sexual activity: Never  Other Topics Concern   Not on file  Social History Narrative   Right handed   One story home   Social Determinants of Health   Financial Resource Strain: Low Risk  (05/23/2017)   Overall Financial Resource Strain (CARDIA)    Difficulty of Paying Living Expenses: Not hard at all  Food Insecurity: No Food Insecurity (05/23/2017)   Hunger Vital Sign    Worried About Running Out of Food in the Last Year: Never true    Ran Out of Food in the Last Year: Never true  Transportation Needs: No Transportation Needs (05/23/2017)   PRAPARE - Hydrologist (Medical): No    Lack of Transportation (Non-Medical): No  Physical Activity: Insufficiently Active (05/23/2017)   Exercise Vital Sign    Days of Exercise per Week: 7 days    Minutes of Exercise per Session: 10 min  Stress: No Stress Concern Present (05/23/2017)   [place]    Feeling of  Stress : Only a little  Social Connections: Somewhat Isolated (05/23/2017)   Social Connection and Isolation Panel [NHANES]    Frequency of Communication with Friends and Family: More than three times a week    Frequency of Social Gatherings with Friends and Family: More than three times a week    Attends Religious Services: More than 4 times per year    Active Member of [employer] or Organizations: No    Attends Archivist Meetings: Never    Marital Status: Widowed  Intimate Partner Violence: Not At Risk (05/23/2017)   Humiliation, Afraid, Rape, and Kick questionnaire    Fear of Current or Ex-Partner: No    Emotionally Abused: No    Physically Abused: No    Sexually Abused: No     Review of Systems    General: *** No chills, fever, night sweats or weight changes.  Cardiovascular:  No chest pain, dyspnea on exertion, edema, orthopnea, palpitations, paroxysmal nocturnal dyspnea. Dermatological: No rash, lesions/masses Respiratory: No cough, dyspnea Urologic: No hematuria, dysuria Abdominal:   No nausea, vomiting, diarrhea, bright red blood per rectum, melena, or hematemesis Neurologic:  No visual changes, weakness, changes in mental status. All other systems reviewed and are otherwise negative except as noted above.  Physical Exam    VS:  There were no vitals taken for this visit. , BMI There is no height or weight on file to calculate BMI. GEN: *** Well nourished, well developed, in no acute distress. HEENT: Normal. Neck: Supple, no JVD, carotid bruits, or masses. Cardiac: RRR, no murmurs, rubs, or gallops. No clubbing, cyanosis, edema.  Radials/DP/PT 2+ and equal bilaterally.  Respiratory:  Respirations regular and unlabored, clear to auscultation bilaterally. GI: Soft, nontender, nondistended. MS: No deformity or atrophy. Skin: Warm and dry, no rash. Neuro: Strength and sensation are intact. Psych: Normal affect.  Accessory Clinical Findings    The following studies  were reviewed for this visit: ***  Recent Labs: 07/03/2021: TSH 0.85 01/24/2022: Magnesium 1.9 01/27/2022: ALT 200; BUN 20; Creatinine, Ser 1.36; Potassium 3.6; Sodium 133 01/28/2022: Hemoglobin 11.3; Platelets 177   Recent Lipid Panel    Component Value Date/Time   CHOL 140 07/03/2021 1413   CHOL 152 05/30/2015 0809   TRIG 72 07/03/2021 1413   HDL 60 07/03/2021 1413   HDL 54 05/30/2015 0809   CHOLHDL 2.3 07/03/2021 1413   VLDL 11 06/24/2020 1932   LDLCALC 65 07/03/2021 1413    No BP recorded.  {Refresh Note OR Click here  to enter BP  :1}***    ECG personally reviewed by me today: ***  No significant changes from ***  {Does this patient have ATRIAL FIBRILLATION?:[phone]}   Assessment & Plan   ***      Disposition: ***   Justice Britain. Shaunie Boehm, NP-C     02/09/2022, 7:28 AM [place] [address] Office [phone] Fax [phone]   I spent *** minutes examining this patient, reviewing medications, and using patient centered shared decision making involving her cardiac care.  Prior to her visit I spent greater than 20 minutes reviewing her past medical history,  medications, and prior cardiac tests.

## 2022-02-11 ENCOUNTER — Ambulatory Visit: Payer: Medicare Other | Attending: Physician Assistant | Admitting: Physician Assistant

## 2022-02-11 DIAGNOSIS — N1831 Chronic kidney disease, stage 3a: Secondary | ICD-10-CM | POA: Diagnosis not present

## 2022-02-11 DIAGNOSIS — I1310 Hypertensive heart and chronic kidney disease without heart failure, with stage 1 through stage 4 chronic kidney disease, or unspecified chronic kidney disease: Secondary | ICD-10-CM | POA: Diagnosis not present

## 2022-02-11 DIAGNOSIS — I69354 Hemiplegia and hemiparesis following cerebral infarction affecting left non-dominant side: Secondary | ICD-10-CM | POA: Diagnosis not present

## 2022-02-11 DIAGNOSIS — I4821 Permanent atrial fibrillation: Secondary | ICD-10-CM | POA: Diagnosis not present

## 2022-02-11 DIAGNOSIS — J449 Chronic obstructive pulmonary disease, unspecified: Secondary | ICD-10-CM | POA: Diagnosis not present

## 2022-02-11 DIAGNOSIS — Z48815 Encounter for surgical aftercare following surgery on the digestive system: Secondary | ICD-10-CM | POA: Diagnosis not present

## 2022-02-14 DIAGNOSIS — I1310 Hypertensive heart and chronic kidney disease without heart failure, with stage 1 through stage 4 chronic kidney disease, or unspecified chronic kidney disease: Secondary | ICD-10-CM | POA: Diagnosis not present

## 2022-02-14 DIAGNOSIS — I4821 Permanent atrial fibrillation: Secondary | ICD-10-CM | POA: Diagnosis not present

## 2022-02-14 DIAGNOSIS — Z48815 Encounter for surgical aftercare following surgery on the digestive system: Secondary | ICD-10-CM | POA: Diagnosis not present

## 2022-02-14 DIAGNOSIS — J449 Chronic obstructive pulmonary disease, unspecified: Secondary | ICD-10-CM | POA: Diagnosis not present

## 2022-02-14 DIAGNOSIS — N1831 Chronic kidney disease, stage 3a: Secondary | ICD-10-CM | POA: Diagnosis not present

## 2022-02-14 DIAGNOSIS — I69354 Hemiplegia and hemiparesis following cerebral infarction affecting left non-dominant side: Secondary | ICD-10-CM | POA: Diagnosis not present

## 2022-02-16 ENCOUNTER — Encounter: Payer: Self-pay | Admitting: Physician Assistant

## 2022-02-19 DIAGNOSIS — I4821 Permanent atrial fibrillation: Secondary | ICD-10-CM | POA: Diagnosis not present

## 2022-02-19 DIAGNOSIS — I69354 Hemiplegia and hemiparesis following cerebral infarction affecting left non-dominant side: Secondary | ICD-10-CM | POA: Diagnosis not present

## 2022-02-19 DIAGNOSIS — I1310 Hypertensive heart and chronic kidney disease without heart failure, with stage 1 through stage 4 chronic kidney disease, or unspecified chronic kidney disease: Secondary | ICD-10-CM | POA: Diagnosis not present

## 2022-02-19 DIAGNOSIS — Z48815 Encounter for surgical aftercare following surgery on the digestive system: Secondary | ICD-10-CM | POA: Diagnosis not present

## 2022-02-19 DIAGNOSIS — N1831 Chronic kidney disease, stage 3a: Secondary | ICD-10-CM | POA: Diagnosis not present

## 2022-02-19 DIAGNOSIS — J449 Chronic obstructive pulmonary disease, unspecified: Secondary | ICD-10-CM | POA: Diagnosis not present

## 2022-03-02 DIAGNOSIS — K760 Fatty (change of) liver, not elsewhere classified: Secondary | ICD-10-CM | POA: Diagnosis not present

## 2022-03-02 DIAGNOSIS — J90 Pleural effusion, not elsewhere classified: Secondary | ICD-10-CM | POA: Diagnosis not present

## 2022-03-02 DIAGNOSIS — J449 Chronic obstructive pulmonary disease, unspecified: Secondary | ICD-10-CM | POA: Diagnosis not present

## 2022-03-02 DIAGNOSIS — G9341 Metabolic encephalopathy: Secondary | ICD-10-CM | POA: Diagnosis not present

## 2022-03-02 DIAGNOSIS — I69354 Hemiplegia and hemiparesis following cerebral infarction affecting left non-dominant side: Secondary | ICD-10-CM | POA: Diagnosis not present

## 2022-03-02 DIAGNOSIS — M19012 Primary osteoarthritis, left shoulder: Secondary | ICD-10-CM | POA: Diagnosis not present

## 2022-03-02 DIAGNOSIS — N1831 Chronic kidney disease, stage 3a: Secondary | ICD-10-CM | POA: Diagnosis not present

## 2022-03-02 DIAGNOSIS — I1310 Hypertensive heart and chronic kidney disease without heart failure, with stage 1 through stage 4 chronic kidney disease, or unspecified chronic kidney disease: Secondary | ICD-10-CM | POA: Diagnosis not present

## 2022-03-02 DIAGNOSIS — Z853 Personal history of malignant neoplasm of breast: Secondary | ICD-10-CM | POA: Diagnosis not present

## 2022-03-02 DIAGNOSIS — I700 Atherosclerosis of aorta: Secondary | ICD-10-CM | POA: Diagnosis not present

## 2022-03-02 DIAGNOSIS — I4821 Permanent atrial fibrillation: Secondary | ICD-10-CM | POA: Diagnosis not present

## 2022-03-02 DIAGNOSIS — Z48815 Encounter for surgical aftercare following surgery on the digestive system: Secondary | ICD-10-CM | POA: Diagnosis not present

## 2022-03-02 DIAGNOSIS — Z953 Presence of xenogenic heart valve: Secondary | ICD-10-CM | POA: Diagnosis not present

## 2022-03-02 DIAGNOSIS — E785 Hyperlipidemia, unspecified: Secondary | ICD-10-CM | POA: Diagnosis not present

## 2022-03-02 DIAGNOSIS — R188 Other ascites: Secondary | ICD-10-CM | POA: Diagnosis not present

## 2022-03-02 DIAGNOSIS — Z9181 History of falling: Secondary | ICD-10-CM | POA: Diagnosis not present

## 2022-03-02 DIAGNOSIS — Z7901 Long term (current) use of anticoagulants: Secondary | ICD-10-CM | POA: Diagnosis not present

## 2022-03-04 ENCOUNTER — Other Ambulatory Visit (HOSPITAL_COMMUNITY): Payer: Self-pay

## 2022-03-04 ENCOUNTER — Other Ambulatory Visit: Payer: Self-pay | Admitting: Cardiovascular Disease

## 2022-03-05 ENCOUNTER — Other Ambulatory Visit (HOSPITAL_COMMUNITY): Payer: Self-pay

## 2022-03-05 DIAGNOSIS — I1310 Hypertensive heart and chronic kidney disease without heart failure, with stage 1 through stage 4 chronic kidney disease, or unspecified chronic kidney disease: Secondary | ICD-10-CM | POA: Diagnosis not present

## 2022-03-05 DIAGNOSIS — N1831 Chronic kidney disease, stage 3a: Secondary | ICD-10-CM | POA: Diagnosis not present

## 2022-03-05 DIAGNOSIS — Z48815 Encounter for surgical aftercare following surgery on the digestive system: Secondary | ICD-10-CM | POA: Diagnosis not present

## 2022-03-05 DIAGNOSIS — I4821 Permanent atrial fibrillation: Secondary | ICD-10-CM | POA: Diagnosis not present

## 2022-03-05 DIAGNOSIS — J449 Chronic obstructive pulmonary disease, unspecified: Secondary | ICD-10-CM | POA: Diagnosis not present

## 2022-03-05 DIAGNOSIS — I69354 Hemiplegia and hemiparesis following cerebral infarction affecting left non-dominant side: Secondary | ICD-10-CM | POA: Diagnosis not present

## 2022-03-05 MED ORDER — APIXABAN 5 MG PO TABS
5.0000 mg | ORAL_TABLET | Freq: Two times a day (BID) | ORAL | 1 refills | Status: DC
  Filled 2022-03-05: qty 180, 90d supply, fill #0
  Filled 2022-07-19 (×2): qty 180, 90d supply, fill #1

## 2022-03-05 NOTE — Telephone Encounter (Signed)
Prescription refill request for Eliquis received. Indication:afib Last office visit:5/23 Scr:1.3 Age: 89 Weight:70.3  kg  Prescription refilled

## 2022-03-10 DIAGNOSIS — J449 Chronic obstructive pulmonary disease, unspecified: Secondary | ICD-10-CM | POA: Diagnosis not present

## 2022-03-10 DIAGNOSIS — I69354 Hemiplegia and hemiparesis following cerebral infarction affecting left non-dominant side: Secondary | ICD-10-CM | POA: Diagnosis not present

## 2022-03-10 DIAGNOSIS — I1310 Hypertensive heart and chronic kidney disease without heart failure, with stage 1 through stage 4 chronic kidney disease, or unspecified chronic kidney disease: Secondary | ICD-10-CM | POA: Diagnosis not present

## 2022-03-10 DIAGNOSIS — Z48815 Encounter for surgical aftercare following surgery on the digestive system: Secondary | ICD-10-CM | POA: Diagnosis not present

## 2022-03-10 DIAGNOSIS — N1831 Chronic kidney disease, stage 3a: Secondary | ICD-10-CM | POA: Diagnosis not present

## 2022-03-10 DIAGNOSIS — I4821 Permanent atrial fibrillation: Secondary | ICD-10-CM | POA: Diagnosis not present

## 2022-03-17 DIAGNOSIS — J449 Chronic obstructive pulmonary disease, unspecified: Secondary | ICD-10-CM | POA: Diagnosis not present

## 2022-03-17 DIAGNOSIS — N1831 Chronic kidney disease, stage 3a: Secondary | ICD-10-CM | POA: Diagnosis not present

## 2022-03-17 DIAGNOSIS — I4821 Permanent atrial fibrillation: Secondary | ICD-10-CM | POA: Diagnosis not present

## 2022-03-17 DIAGNOSIS — I69354 Hemiplegia and hemiparesis following cerebral infarction affecting left non-dominant side: Secondary | ICD-10-CM | POA: Diagnosis not present

## 2022-03-17 DIAGNOSIS — I1310 Hypertensive heart and chronic kidney disease without heart failure, with stage 1 through stage 4 chronic kidney disease, or unspecified chronic kidney disease: Secondary | ICD-10-CM | POA: Diagnosis not present

## 2022-03-17 DIAGNOSIS — Z48815 Encounter for surgical aftercare following surgery on the digestive system: Secondary | ICD-10-CM | POA: Diagnosis not present

## 2022-03-18 ENCOUNTER — Other Ambulatory Visit: Payer: Self-pay | Admitting: Family

## 2022-03-18 ENCOUNTER — Other Ambulatory Visit (HOSPITAL_COMMUNITY): Payer: Self-pay

## 2022-03-18 DIAGNOSIS — I10 Essential (primary) hypertension: Secondary | ICD-10-CM

## 2022-03-19 MED ORDER — VALSARTAN 80 MG PO TABS
80.0000 mg | ORAL_TABLET | Freq: Every evening | ORAL | 1 refills | Status: DC
  Filled 2022-03-19: qty 90, 90d supply, fill #0
  Filled 2022-08-24 (×2): qty 90, 90d supply, fill #1

## 2022-03-19 MED ORDER — VALSARTAN 160 MG PO TABS
160.0000 mg | ORAL_TABLET | Freq: Every morning | ORAL | 1 refills | Status: DC
  Filled 2022-03-19: qty 90, 90d supply, fill #0
  Filled 2022-08-06: qty 90, 90d supply, fill #1

## 2022-03-22 ENCOUNTER — Other Ambulatory Visit: Payer: Self-pay

## 2022-03-22 ENCOUNTER — Other Ambulatory Visit (HOSPITAL_COMMUNITY): Payer: Self-pay

## 2022-03-22 ENCOUNTER — Emergency Department (HOSPITAL_COMMUNITY): Payer: Medicare Other

## 2022-03-22 ENCOUNTER — Encounter (HOSPITAL_COMMUNITY): Payer: Self-pay | Admitting: Emergency Medicine

## 2022-03-22 ENCOUNTER — Emergency Department (HOSPITAL_COMMUNITY)
Admission: EM | Admit: 2022-03-22 | Discharge: 2022-03-23 | Disposition: A | Discharge status: Home / Self Care | Payer: Medicare Other | Attending: Emergency Medicine | Admitting: Emergency Medicine

## 2022-03-22 DIAGNOSIS — R410 Disorientation, unspecified: Secondary | ICD-10-CM | POA: Diagnosis not present

## 2022-03-22 DIAGNOSIS — E876 Hypokalemia: Secondary | ICD-10-CM | POA: Diagnosis not present

## 2022-03-22 DIAGNOSIS — R9389 Abnormal findings on diagnostic imaging of other specified body structures: Secondary | ICD-10-CM

## 2022-03-22 DIAGNOSIS — R1013 Epigastric pain: Secondary | ICD-10-CM | POA: Insufficient documentation

## 2022-03-22 DIAGNOSIS — R079 Chest pain, unspecified: Secondary | ICD-10-CM

## 2022-03-22 DIAGNOSIS — K7460 Unspecified cirrhosis of liver: Secondary | ICD-10-CM | POA: Diagnosis not present

## 2022-03-22 DIAGNOSIS — Z7901 Long term (current) use of anticoagulants: Secondary | ICD-10-CM | POA: Insufficient documentation

## 2022-03-22 DIAGNOSIS — I2510 Atherosclerotic heart disease of native coronary artery without angina pectoris: Secondary | ICD-10-CM | POA: Insufficient documentation

## 2022-03-22 DIAGNOSIS — I480 Paroxysmal atrial fibrillation: Secondary | ICD-10-CM | POA: Insufficient documentation

## 2022-03-22 DIAGNOSIS — R942 Abnormal results of pulmonary function studies: Secondary | ICD-10-CM | POA: Insufficient documentation

## 2022-03-22 DIAGNOSIS — F0390 Unspecified dementia without behavioral disturbance: Secondary | ICD-10-CM | POA: Diagnosis not present

## 2022-03-22 DIAGNOSIS — I700 Atherosclerosis of aorta: Secondary | ICD-10-CM | POA: Insufficient documentation

## 2022-03-22 DIAGNOSIS — J90 Pleural effusion, not elsewhere classified: Secondary | ICD-10-CM | POA: Insufficient documentation

## 2022-03-22 DIAGNOSIS — I10 Essential (primary) hypertension: Secondary | ICD-10-CM | POA: Insufficient documentation

## 2022-03-22 DIAGNOSIS — Z952 Presence of prosthetic heart valve: Secondary | ICD-10-CM | POA: Diagnosis not present

## 2022-03-22 DIAGNOSIS — R0789 Other chest pain: Secondary | ICD-10-CM | POA: Diagnosis not present

## 2022-03-22 DIAGNOSIS — R112 Nausea with vomiting, unspecified: Secondary | ICD-10-CM | POA: Diagnosis not present

## 2022-03-22 DIAGNOSIS — K5730 Diverticulosis of large intestine without perforation or abscess without bleeding: Secondary | ICD-10-CM | POA: Insufficient documentation

## 2022-03-22 DIAGNOSIS — R609 Edema, unspecified: Secondary | ICD-10-CM | POA: Insufficient documentation

## 2022-03-22 DIAGNOSIS — R911 Solitary pulmonary nodule: Secondary | ICD-10-CM | POA: Insufficient documentation

## 2022-03-22 DIAGNOSIS — Z79899 Other long term (current) drug therapy: Secondary | ICD-10-CM | POA: Diagnosis not present

## 2022-03-22 DIAGNOSIS — Z8673 Personal history of transient ischemic attack (TIA), and cerebral infarction without residual deficits: Secondary | ICD-10-CM | POA: Insufficient documentation

## 2022-03-22 DIAGNOSIS — I4891 Unspecified atrial fibrillation: Secondary | ICD-10-CM | POA: Diagnosis not present

## 2022-03-22 LAB — COMPREHENSIVE METABOLIC PANEL
ALT: 29 U/L (ref 0–44)
AST: 61 U/L — ABNORMAL HIGH (ref 15–41)
Albumin: 3.7 g/dL (ref 3.5–5.0)
Alkaline Phosphatase: 80 U/L (ref 38–126)
Anion gap: 13 (ref 5–15)
BUN: 10 mg/dL (ref 8–23)
CO2: 23 mmol/L (ref 22–32)
Calcium: 9.4 mg/dL (ref 8.9–10.3)
Chloride: 103 mmol/L (ref 98–111)
Creatinine, Ser: 1.11 mg/dL — ABNORMAL HIGH (ref 0.44–1.00)
GFR, Estimated: 48 mL/min — ABNORMAL LOW (ref >60)
Glucose, Bld: 110 mg/dL — ABNORMAL HIGH (ref 70–99)
Potassium: 3.1 mmol/L — ABNORMAL LOW (ref 3.5–5.1)
Sodium: 139 mmol/L (ref 135–145)
Total Bilirubin: 0.9 mg/dL (ref 0.3–1.2)
Total Protein: 5.9 g/dL — ABNORMAL LOW (ref 6.5–8.1)

## 2022-03-22 LAB — CBC
HCT: 34.6 % — ABNORMAL LOW (ref 36.0–46.0)
Hemoglobin: 11.6 g/dL — ABNORMAL LOW (ref 12.0–15.0)
MCH: 33.9 pg (ref 26.0–34.0)
MCHC: 33.5 g/dL (ref 30.0–36.0)
MCV: 101.2 fL — ABNORMAL HIGH (ref 80.0–100.0)
Platelets: 158 10*3/uL (ref 150–400)
RBC: 3.42 MIL/uL — ABNORMAL LOW (ref 3.87–5.11)
RDW: 14.3 % (ref 11.5–15.5)
WBC: 7.5 10*3/uL (ref 4.0–10.5)
nRBC: 0 % (ref 0.0–0.2)

## 2022-03-22 LAB — TROPONIN I (HIGH SENSITIVITY): Troponin I (High Sensitivity): 16 ng/L (ref <18)

## 2022-03-22 LAB — LIPASE, BLOOD: Lipase: 32 U/L (ref 11–51)

## 2022-03-22 MED ORDER — ONDANSETRON 4 MG PO TBDP
4.0000 mg | ORAL_TABLET | Freq: Once | ORAL | Status: AC
  Administered 2022-03-22: 4 mg via ORAL
  Filled 2022-03-22: qty 1

## 2022-03-22 NOTE — ED Provider Triage Note (Signed)
Emergency Medicine Provider Triage Evaluation Note  [name] , a 89 y.o. female  was evaluated in triage.  Pt complains of right upper quadrant abdominal pain.  Patient had a cholecystectomy on 01/25/2022.  Has associated nausea, emesis.  No measured prior to arrival.  Denies shortness of breath.  Review of Systems  Positive:  Negative:   Physical Exam  BP (!) 192/71 (BP Location: Right Arm)   Pulse 79   Temp 98.4 F (36.9 C)   Resp 20   Ht '5\' 3"'$  (1.6 m)   Wt 70.3 kg   SpO2 98%   BMI 27.45 kg/m  Gen:   Awake, no distress   Resp:  Normal effort  MSK:   Moves extremities without difficulty  Other:  Tenderness to palpation noted to right upper quadrant epigastric region.  No chest wall tenderness to palpation.  Medical Decision Making  Medically screening exam initiated at 9:44 PM.  Appropriate orders placed.  [name] was informed that the remainder of the evaluation will be completed by another provider, this initial triage assessment does not replace that evaluation, and the importance of remaining in the ED until their evaluation is complete.  Workup initiated   Georgia Delsignore A, PA-C 03/22/22 2144

## 2022-03-22 NOTE — ED Triage Notes (Signed)
BIB [facility] for CP and RUQ pain. PT vomited bile one time.

## 2022-03-22 NOTE — ED Triage Notes (Signed)
EMS report: Pt BIB [facility] from home. C/O substernal chest pain along with abdominal pain RUQ. Noted elevated SBP >200. A Fib

## 2022-03-23 ENCOUNTER — Other Ambulatory Visit (HOSPITAL_COMMUNITY): Payer: Self-pay

## 2022-03-23 ENCOUNTER — Emergency Department (HOSPITAL_COMMUNITY): Payer: Medicare Other

## 2022-03-23 DIAGNOSIS — K7460 Unspecified cirrhosis of liver: Secondary | ICD-10-CM | POA: Diagnosis not present

## 2022-03-23 DIAGNOSIS — J90 Pleural effusion, not elsewhere classified: Secondary | ICD-10-CM | POA: Diagnosis not present

## 2022-03-23 DIAGNOSIS — R079 Chest pain, unspecified: Secondary | ICD-10-CM | POA: Diagnosis not present

## 2022-03-23 DIAGNOSIS — K5730 Diverticulosis of large intestine without perforation or abscess without bleeding: Secondary | ICD-10-CM | POA: Diagnosis not present

## 2022-03-23 LAB — URINALYSIS, ROUTINE W REFLEX MICROSCOPIC
Bacteria, UA: NONE SEEN
Bilirubin Urine: NEGATIVE
Glucose, UA: NEGATIVE mg/dL
Hgb urine dipstick: NEGATIVE
Ketones, ur: NEGATIVE mg/dL
Nitrite: NEGATIVE
Protein, ur: NEGATIVE mg/dL
Specific Gravity, Urine: 1.026 (ref 1.005–1.030)
pH: 6 (ref 5.0–8.0)

## 2022-03-23 LAB — TROPONIN I (HIGH SENSITIVITY): Troponin I (High Sensitivity): 16 ng/L (ref <18)

## 2022-03-23 LAB — BRAIN NATRIURETIC PEPTIDE: B Natriuretic Peptide: 310 pg/mL — ABNORMAL HIGH (ref 0.0–100.0)

## 2022-03-23 MED ORDER — PANTOPRAZOLE SODIUM 20 MG PO TBEC
20.0000 mg | DELAYED_RELEASE_TABLET | Freq: Every day | ORAL | 0 refills | Status: DC
  Filled 2022-03-23: qty 30, 30d supply, fill #0

## 2022-03-23 MED ORDER — IOHEXOL 350 MG/ML SOLN
75.0000 mL | Freq: Once | INTRAVENOUS | Status: AC | PRN
  Administered 2022-03-23: 75 mL via INTRAVENOUS

## 2022-03-23 MED ORDER — POTASSIUM CHLORIDE CRYS ER 20 MEQ PO TBCR
20.0000 meq | EXTENDED_RELEASE_TABLET | Freq: Two times a day (BID) | ORAL | 0 refills | Status: DC
  Filled 2022-03-23: qty 20, 10d supply, fill #0

## 2022-03-23 NOTE — ED Provider Notes (Signed)
[facility] Provider Note   CSN: [id number] Arrival date & time: 03/22/22  2118     History  Chief Complaint  Patient presents with   Abdominal Pain   Chest Pain   *Son was present to provide history  [name] is a 89 y.o. female, status post atrial valve replacement, cholecystectomy, history of A-fib, CVA, presented with chest pain that began last night at 2100.  Obtaining history was difficult as patient did exhibit features of dementia.  Patient is on Eliquis.  Patient reported chest pain began at rest and and when given nitroglycerin by EMS the pain went away.  Patient states the pain is in the epigastric region and radiates to back.  Patient was unable to quantify the severity of the pain.  Son states patient is usually compliant with medications but if she misses a dose it is the metoprolol as she does not like how it makes her feel.   Patient and son denied fever, recent illness, headache, vision changes, syncope  Home Medications Prior to Admission medications   Medication Sig Start Date End Date Taking? Authorizing Provider  pantoprazole (PROTONIX) 20 MG tablet Take 1 tablet (20 mg total) by mouth daily. 03/23/22  Yes Jaycion Treml, Jeneen Rinks T, PA-C  potassium chloride SA (KLOR-CON M) 20 MEQ tablet Take 1 tablet (20 mEq total) by mouth 2 (two) times daily. 03/23/22  Yes Chuck Hint, PA-C  acetaminophen (TYLENOL) 650 MG CR tablet Take 1 tablet (650 mg total) by mouth 2 (two) times daily. 12/25/21   Lauree Chandler, NP  apixaban (ELIQUIS) 5 MG TABS tablet Take 1 tablet (5 mg total) by mouth 2 (two) times daily. 03/05/22   Lorretta Harp, MD  Ascorbic Acid (VITAMIN C) 100 MG tablet Take 100 mg by mouth daily.    [provider]  atorvastatin (LIPITOR) 40 MG tablet Take 1 tablet (40 mg total) by mouth daily. 06/26/21   Ngetich, [name] C, NP  Calcium Citrate-Vitamin D (CITRACAL + D PO) Take 1 tablet by mouth daily.     [provider]  diclofenac Sodium (VOLTAREN) 1 % GEL Apply 4 g topically 4 (four) times daily as needed. Patient taking differently: Apply 4 g topically 4 (four) times daily as needed (leg and knee pain from arthritis). 12/25/21   Lauree Chandler, NP  Magnesium Hydroxide (PHILLIPS MILK OF MAGNESIA PO) Take 15 mLs by mouth at bedtime. Take daily at 6 o'clock at night    [provider]  metoprolol tartrate (LOPRESSOR) 25 MG tablet Take 1 tablet (25 mg total) by mouth 2 (two) times daily. 07/30/21   Lorretta Harp, MD  mirabegron ER (MYRBETRIQ) 50 MG TB24 tablet Take 1 tablet (50 mg total) by mouth daily. 06/26/21   Ngetich, [name] C, NP  valsartan (DIOVAN) 160 MG tablet Take 1 tablet (160 mg total) by mouth in the morning. 03/19/22   Ngetich, [name] C, NP  valsartan (DIOVAN) 80 MG tablet Take 1 tablet (80 mg total) by mouth every evening. 03/19/22   Ngetich, [name] C, NP      Allergies    Lisinopril, Ceftriaxone, and Codeine    Review of Systems   Review of Systems  Cardiovascular:  Positive for chest pain.  Gastrointestinal:  Positive for abdominal pain.  See HPI  Physical Exam Updated Vital Signs BP (!) 182/90   Pulse 86   Temp 97.9 F (36.6 C) (Oral)   Resp 16  Ht '5\' 3"'$  (1.6 m)   Wt 70.3 kg   SpO2 97%   BMI 27.45 kg/m  Physical Exam Vitals and nursing note reviewed.  Constitutional:      General: She is not in acute distress.    Appearance: She is well-developed.  HENT:     Head: Normocephalic and atraumatic.  Eyes:     Extraocular Movements: Extraocular movements intact.     Conjunctiva/sclera: Conjunctivae normal.     Pupils: Pupils are equal, round, and reactive to light.  Cardiovascular:     Rate and Rhythm: Normal rate. Rhythm irregular.     Heart sounds: No murmur heard. Pulmonary:     Effort: Pulmonary effort is normal. No respiratory distress.     Breath sounds: Normal breath sounds.  Abdominal:     General: Abdomen is flat. Bowel sounds  are normal.     Palpations: Abdomen is soft.     Tenderness: There is no abdominal tenderness. There is no guarding or rebound.     Hernia: No hernia is present.  Musculoskeletal:     Cervical back: Neck supple.     Right lower leg: No edema.     Left lower leg: No edema.  Skin:    General: Skin is warm and dry.     Capillary Refill: Capillary refill takes less than 2 seconds.  Neurological:     General: No focal deficit present.     Mental Status: She is alert. Mental status is at baseline.     Comments: Appears confused but according to son this is baseline  Psychiatric:        Mood and Affect: Mood normal.     ED Results / Procedures / Treatments   Labs (all labs ordered are listed, but only abnormal results are displayed) Labs Reviewed  COMPREHENSIVE METABOLIC PANEL - Abnormal; Notable for the following components:      Result Value   Potassium 3.1 (*)    Glucose, Bld 110 (*)    Creatinine, Ser 1.11 (*)    Total Protein 5.9 (*)    AST 61 (*)    GFR, Estimated 48 (*)    All other components within normal limits  CBC - Abnormal; Notable for the following components:   RBC 3.42 (*)    Hemoglobin 11.6 (*)    HCT 34.6 (*)    MCV 101.2 (*)    All other components within normal limits  URINALYSIS, ROUTINE W REFLEX MICROSCOPIC - Abnormal; Notable for the following components:   Color, Urine STRAW (*)    Leukocytes,Ua TRACE (*)    All other components within normal limits  BRAIN NATRIURETIC PEPTIDE - Abnormal; Notable for the following components:   B Natriuretic Peptide 310.0 (*)    All other components within normal limits  LIPASE, BLOOD  TROPONIN I (HIGH SENSITIVITY)  TROPONIN I (HIGH SENSITIVITY)    EKG EKG Interpretation  Date/Time:  Monday March 22 2022 21:27:41 EST Ventricular Rate:  64 PR Interval:    QRS Duration: 96 QT Interval:  452 QTC Calculation: 466 R Axis:   -18 Text Interpretation: Atrial fibrillation Nonspecific ST abnormality Abnormal ECG  When compared with ECG of 24-Jan-2022 05:09, No significant change was found Confirmed by Delora Fuel ([id number]) on 03/23/2022 12:52:08 AM  Radiology CT Abdomen Pelvis W Contrast  Result Date: 03/23/2022 CLINICAL DATA:  89 year old female with history of substernal chest pain and right lower quadrant abdominal pain. EXAM: CT ANGIOGRAPHY CHEST CT ABDOMEN AND PELVIS WITH  CONTRAST TECHNIQUE: Multidetector CT imaging of the chest was performed using the standard protocol during bolus administration of intravenous contrast. Multiplanar CT image reconstructions and MIPs were obtained to evaluate the vascular anatomy. Multidetector CT imaging of the abdomen and pelvis was performed using the standard protocol during bolus administration of intravenous contrast. RADIATION DOSE REDUCTION: This exam was performed according to the departmental dose-optimization program which includes automated exposure control, adjustment of the mA and/or kV according to patient size and/or use of iterative reconstruction technique. CONTRAST:  60m OMNIPAQUE IOHEXOL 350 MG/ML SOLN COMPARISON:  Chest CTA 09/16/2017. No prior CT of the abdomen and pelvis. FINDINGS: CTA CHEST FINDINGS Cardiovascular: There are no filling defects within the pulmonary arterial tree to suggest pulmonary embolism. Heart size is enlarged with biatrial dilatation. There is no significant pericardial fluid, thickening or pericardial calcification. There is aortic atherosclerosis, as well as atherosclerosis of the great vessels of the mediastinum and the coronary arteries, including calcified atherosclerotic plaque in the left main, left anterior descending and right coronary arteries. Calcifications of the mitral annulus. Status post median sternotomy for aortic valve replacement with what appears to be a stented bioprosthesis. Mediastinum/Nodes: No pathologically enlarged mediastinal or hilar lymph nodes. Esophagus is unremarkable in appearance. No axillary  lymphadenopathy. Lungs/Pleura: Small right pleural effusion lying dependently. No left pleural effusion. No acute consolidative airspace disease. Numerous tiny 1-3 mm pulmonary nodules scattered throughout the periphery of both lungs, nonspecific, but statistically likely benign areas of mucoid impaction within terminal bronchioles. The largest pulmonary nodule measures 3 mm in the periphery of the right upper lobe (axial image 37 of series 6), stable compared to prior examination from 09/16/2017, considered definitively benign. Many of the other small pulmonary nodules can not be confirmed on the prior examination, but are also likely areas of mucoid impaction. Musculoskeletal: Median sternotomy wires. Status post left modified radical mastectomy and left breast reconstruction with a left-sided implants in a subpectoral location. There are no aggressive appearing lytic or blastic lesions noted in the visualized portions of the skeleton. Review of the MIP images confirms the above findings. CT ABDOMEN and PELVIS FINDINGS Hepatobiliary: Liver has a slightly shrunken appearance and nodular contour, indicative of underlying cirrhosis. No discrete cystic or solid hepatic lesions. Mild periportal edema. No intra or extrahepatic biliary ductal dilatation. Status post cholecystectomy. Pancreas: No pancreatic mass. No pancreatic ductal dilatation. No pancreatic or peripancreatic fluid collections or inflammatory changes. Spleen: Unremarkable. Adrenals/Urinary Tract: Bilateral kidneys and adrenal glands are normal in appearance. No hydroureteronephrosis. Urinary bladder is partially obscured by beam hardening artifact from the patient's right hip arthroplasty, but visualized portions are unremarkable. Stomach/Bowel: The appearance of the stomach is normal. There is no pathologic dilatation of small bowel or colon. Numerous colonic diverticula are noted, without definite focal surrounding inflammatory changes to indicate an  acute diverticulitis at this time. The appendix is not confidently identified and may be surgically absent. Regardless, there are no inflammatory changes noted adjacent to the cecum to suggest the presence of an acute appendicitis at this time. Vascular/Lymphatic: Aortic atherosclerosis, without evidence of aneurysm or dissection in the abdominal or pelvic vasculature. Retroaortic left renal vein (normal anatomical variant) incidentally noted. No lymphadenopathy noted in the abdomen or pelvis. Reproductive: Status post hysterectomy.  Ovaries are atrophic. Other: No significant volume of ascites.  No pneumoperitoneum. Musculoskeletal: Status post right hip arthroplasty. There are no aggressive appearing lytic or blastic lesions noted in the visualized portions of the skeleton. Review of the MIP images confirms  the above findings. IMPRESSION: 1. No evidence of pulmonary embolism. 2. Morphologic changes in the liver indicative of underlying cirrhosis. There is also periportal edema in the liver, which is nonspecific, but warrants correlation with liver function tests. No other acute findings are noted in the abdomen or pelvis to account for the patient's symptoms. 3. Small right pleural effusion lying dependently. 4. Multiple tiny pulmonary nodules scattered throughout the lungs bilaterally measuring 1-3 mm in size, nonspecific, but statistically likely benign areas of mucoid impaction within terminal bronchioles. No follow-up needed if patient is low-risk (and has no known or suspected primary neoplasm). Non-contrast chest CT can be considered in 12 months if patient is high-risk. This recommendation follows the consensus statement: Guidelines for Management of Incidental Pulmonary Nodules Detected on CT Images: From the Fleischner Society 2017; Radiology 2017; 284:228-243. 5. Colonic diverticulosis without evidence of acute diverticulitis at this time. 6. Cardiomegaly with biatrial dilatation. 7. There are  calcifications of the mitral annulus. Echocardiographic correlation for evaluation of potential valvular dysfunction may be warranted if clinically indicated. 8. Aortic atherosclerosis, in addition to left main and 2 vessel coronary artery disease. 9. Additional incidental findings, as above. Electronically Signed   By: Vinnie Langton M.D.   On: 03/23/2022 05:32   CT Angio Chest PE W and/or Wo Contrast  Result Date: 03/23/2022 CLINICAL DATA:  89 year old female with history of substernal chest pain and right lower quadrant abdominal pain. EXAM: CT ANGIOGRAPHY CHEST CT ABDOMEN AND PELVIS WITH CONTRAST TECHNIQUE: Multidetector CT imaging of the chest was performed using the standard protocol during bolus administration of intravenous contrast. Multiplanar CT image reconstructions and MIPs were obtained to evaluate the vascular anatomy. Multidetector CT imaging of the abdomen and pelvis was performed using the standard protocol during bolus administration of intravenous contrast. RADIATION DOSE REDUCTION: This exam was performed according to the departmental dose-optimization program which includes automated exposure control, adjustment of the mA and/or kV according to patient size and/or use of iterative reconstruction technique. CONTRAST:  54m OMNIPAQUE IOHEXOL 350 MG/ML SOLN COMPARISON:  Chest CTA 09/16/2017. No prior CT of the abdomen and pelvis. FINDINGS: CTA CHEST FINDINGS Cardiovascular: There are no filling defects within the pulmonary arterial tree to suggest pulmonary embolism. Heart size is enlarged with biatrial dilatation. There is no significant pericardial fluid, thickening or pericardial calcification. There is aortic atherosclerosis, as well as atherosclerosis of the great vessels of the mediastinum and the coronary arteries, including calcified atherosclerotic plaque in the left main, left anterior descending and right coronary arteries. Calcifications of the mitral annulus. Status post median  sternotomy for aortic valve replacement with what appears to be a stented bioprosthesis. Mediastinum/Nodes: No pathologically enlarged mediastinal or hilar lymph nodes. Esophagus is unremarkable in appearance. No axillary lymphadenopathy. Lungs/Pleura: Small right pleural effusion lying dependently. No left pleural effusion. No acute consolidative airspace disease. Numerous tiny 1-3 mm pulmonary nodules scattered throughout the periphery of both lungs, nonspecific, but statistically likely benign areas of mucoid impaction within terminal bronchioles. The largest pulmonary nodule measures 3 mm in the periphery of the right upper lobe (axial image 37 of series 6), stable compared to prior examination from 09/16/2017, considered definitively benign. Many of the other small pulmonary nodules can not be confirmed on the prior examination, but are also likely areas of mucoid impaction. Musculoskeletal: Median sternotomy wires. Status post left modified radical mastectomy and left breast reconstruction with a left-sided implants in a subpectoral location. There are no aggressive appearing lytic or blastic lesions noted in  the visualized portions of the skeleton. Review of the MIP images confirms the above findings. CT ABDOMEN and PELVIS FINDINGS Hepatobiliary: Liver has a slightly shrunken appearance and nodular contour, indicative of underlying cirrhosis. No discrete cystic or solid hepatic lesions. Mild periportal edema. No intra or extrahepatic biliary ductal dilatation. Status post cholecystectomy. Pancreas: No pancreatic mass. No pancreatic ductal dilatation. No pancreatic or peripancreatic fluid collections or inflammatory changes. Spleen: Unremarkable. Adrenals/Urinary Tract: Bilateral kidneys and adrenal glands are normal in appearance. No hydroureteronephrosis. Urinary bladder is partially obscured by beam hardening artifact from the patient's right hip arthroplasty, but visualized portions are unremarkable.  Stomach/Bowel: The appearance of the stomach is normal. There is no pathologic dilatation of small bowel or colon. Numerous colonic diverticula are noted, without definite focal surrounding inflammatory changes to indicate an acute diverticulitis at this time. The appendix is not confidently identified and may be surgically absent. Regardless, there are no inflammatory changes noted adjacent to the cecum to suggest the presence of an acute appendicitis at this time. Vascular/Lymphatic: Aortic atherosclerosis, without evidence of aneurysm or dissection in the abdominal or pelvic vasculature. Retroaortic left renal vein (normal anatomical variant) incidentally noted. No lymphadenopathy noted in the abdomen or pelvis. Reproductive: Status post hysterectomy.  Ovaries are atrophic. Other: No significant volume of ascites.  No pneumoperitoneum. Musculoskeletal: Status post right hip arthroplasty. There are no aggressive appearing lytic or blastic lesions noted in the visualized portions of the skeleton. Review of the MIP images confirms the above findings. IMPRESSION: 1. No evidence of pulmonary embolism. 2. Morphologic changes in the liver indicative of underlying cirrhosis. There is also periportal edema in the liver, which is nonspecific, but warrants correlation with liver function tests. No other acute findings are noted in the abdomen or pelvis to account for the patient's symptoms. 3. Small right pleural effusion lying dependently. 4. Multiple tiny pulmonary nodules scattered throughout the lungs bilaterally measuring 1-3 mm in size, nonspecific, but statistically likely benign areas of mucoid impaction within terminal bronchioles. No follow-up needed if patient is low-risk (and has no known or suspected primary neoplasm). Non-contrast chest CT can be considered in 12 months if patient is high-risk. This recommendation follows the consensus statement: Guidelines for Management of Incidental Pulmonary Nodules  Detected on CT Images: From the Fleischner Society 2017; Radiology 2017; 284:228-243. 5. Colonic diverticulosis without evidence of acute diverticulitis at this time. 6. Cardiomegaly with biatrial dilatation. 7. There are calcifications of the mitral annulus. Echocardiographic correlation for evaluation of potential valvular dysfunction may be warranted if clinically indicated. 8. Aortic atherosclerosis, in addition to left main and 2 vessel coronary artery disease. 9. Additional incidental findings, as above. Electronically Signed   By: Vinnie Langton M.D.   On: 03/23/2022 05:32   DG Chest 2 View  Result Date: 03/22/2022 CLINICAL DATA:  Right-sided chest pain EXAM: CHEST - 2 VIEW COMPARISON:  Chest x-ray 01/24/2022.  Chest CT 09/16/2017. FINDINGS: The heart is enlarged. Patient is status post aortic valve replacement. There is no focal lung infiltrate. There is a small right pleural effusion. There is no pneumothorax. Biapical pleural calcifications are again seen. No acute fractures are identified. There are atherosclerotic calcifications throughout the aorta. IMPRESSION: 1. Small right pleural effusion. 2. Cardiomegaly. 3. Aortic atherosclerosis. Electronically Signed   By: Ronney Asters M.D.   On: 03/22/2022 22:25    Procedures Procedures    Medications Ordered in ED Medications  ondansetron (ZOFRAN-ODT) disintegrating tablet 4 mg (4 mg Oral Given 03/22/22 2145)  iohexol (  OMNIPAQUE) 350 MG/ML injection 75 mL (75 mLs Intravenous Contrast Given 03/23/22 0453)    ED Course/ Medical Decision Making/ A&P Clinical Course as of 03/23/22 0714  Tue Mar 23, 2022  0356 EKG 12-Lead [RS]    Clinical Course User Index [RS] Yvonne Kendall Gypsy Balsam, PA-C                             Medical Decision Making Amount and/or Complexity of Data Reviewed Labs: ordered. Radiology: ordered.  Risk Prescription drug management.   [name] 89 y.o. presented today for chest pain. Working DDx that I  considered at this time includes, but not limited to, ACS, AAA, complication from cholecystectomy, acid reflux, CHF.  Review of prior external notes: 01/24/22 ED to [facility] Admission  Unique Tests and My Interpretation:  Troponin: Unremarkable CMP hypokalemic 0.1; the rest of the labs are unremarkable BMP elevated at 310 Lipase: Unremarkable CBC: Unremarkable UA: Pending CT abdomen pelvis with contrast: CT angio chest: No acute PE Chest x-ray: R Pleural effusion  Discussion with Independent Historian: Son  Discussion of Management of Tests: None  Risk:   Medium:  - prescription drug management  Risk Stratification Score: none  Staffed with Silverio Decamp, PA-C and Delora Fuel, MD  R/o DDx: PE: CT was negative Complication from cholecystectomy: CT was negative AAA: CT was negative ACS: EKG and serial troponins were negative   Plan: Patient presented for abdominal/chest pain.  Patient was confused during encounter which is baseline cording to the son.  With the patient's overall history we decided it was best that along with labs to scan her chest and abdomen as her chest x-ray came back with pleural effusions in her right lung.  The CT showed pulmonary nodules the patient will need to have rechecked in 12 months.  The CT also showed signs suggestive of cirrhosis.  After speaking with Dr. Roxanne Mins we decided the best course of action would be to prescribe the patient Protonix as this is most likely acid reflux due to the negative labs and CT for any life-threatening diagnoses and we will also prescribe potassium as patient's potassium is currently low at 3.1.  I spoke with the patient about the importance of following up with primary care in about a week and taking potassium pills.  I spoke with the patient about the pulmonary nodules on CT and how 12 months she will need to reevaluated.Son and patient verbalized agreement to this plan.         Final Clinical Impression(s) /  ED Diagnoses Final diagnoses:  Abnormal CT scan  Pulmonary nodule  Pleural effusion  Hypokalemia  Chest pain, unspecified type    Rx / DC Orders ED Discharge Orders          Ordered    potassium chloride SA (KLOR-CON M) 20 MEQ tablet  2 times daily        03/23/22 0659    pantoprazole (PROTONIX) 20 MG tablet  Daily        03/23/22 0659             Chuck Hint, PA-C 00/92/33 0076    Delora Fuel, MD 22/63/33 [phone]

## 2022-03-23 NOTE — Discharge Instructions (Addendum)
Please take your blood pressure medications as soon as you get home as they are prescribed.  Please follow-up with your primary care in the next week to have labs redrawn to recheck your potassium.  Will also need to follow-up with her primary care about the pulmonary nodules found on CT and to have them reevaluated in 12 months.  Please pick up the potassium that I prescribed for you and along with the acid reflux medication.  If symptoms worsen please return to ER.

## 2022-03-24 ENCOUNTER — Telehealth: Payer: Self-pay

## 2022-03-24 DIAGNOSIS — J449 Chronic obstructive pulmonary disease, unspecified: Secondary | ICD-10-CM | POA: Diagnosis not present

## 2022-03-24 DIAGNOSIS — N1831 Chronic kidney disease, stage 3a: Secondary | ICD-10-CM | POA: Diagnosis not present

## 2022-03-24 DIAGNOSIS — I1310 Hypertensive heart and chronic kidney disease without heart failure, with stage 1 through stage 4 chronic kidney disease, or unspecified chronic kidney disease: Secondary | ICD-10-CM | POA: Diagnosis not present

## 2022-03-24 DIAGNOSIS — I4821 Permanent atrial fibrillation: Secondary | ICD-10-CM | POA: Diagnosis not present

## 2022-03-24 DIAGNOSIS — Z48815 Encounter for surgical aftercare following surgery on the digestive system: Secondary | ICD-10-CM | POA: Diagnosis not present

## 2022-03-24 DIAGNOSIS — I69354 Hemiplegia and hemiparesis following cerebral infarction affecting left non-dominant side: Secondary | ICD-10-CM | POA: Diagnosis not present

## 2022-03-24 NOTE — Telephone Encounter (Signed)
Claiborne Billings PT with [facility] left message on clinical intake voicemail stating that patient was recently seen in the ER for chest pain and difficulty breathing. She also states that son left yesterday to run an errand and patient had wandered out of the house and down the street. She advised son that he needed to call and make follow up appointment for ER visit.  Message routed to Marlowe Sax, NP (covering)

## 2022-03-24 NOTE — Telephone Encounter (Signed)
Please schedule appointment for ED follow up.

## 2022-03-25 NOTE — Telephone Encounter (Signed)
Appointment scheduled with son [name].

## 2022-03-26 NOTE — Telephone Encounter (Signed)
error 

## 2022-04-01 ENCOUNTER — Encounter: Payer: Self-pay | Admitting: Family

## 2022-04-01 ENCOUNTER — Ambulatory Visit (INDEPENDENT_AMBULATORY_CARE_PROVIDER_SITE_OTHER): Payer: Medicare Other | Admitting: Family

## 2022-04-01 VITALS — BP 110/80 | HR 68 | Temp 97.5°F | Resp 16 | Ht 63.0 in | Wt 153.6 lb

## 2022-04-01 DIAGNOSIS — I480 Paroxysmal atrial fibrillation: Secondary | ICD-10-CM

## 2022-04-01 DIAGNOSIS — E876 Hypokalemia: Secondary | ICD-10-CM | POA: Diagnosis not present

## 2022-04-01 DIAGNOSIS — J90 Pleural effusion, not elsewhere classified: Secondary | ICD-10-CM | POA: Diagnosis not present

## 2022-04-01 DIAGNOSIS — K5730 Diverticulosis of large intestine without perforation or abscess without bleeding: Secondary | ICD-10-CM | POA: Diagnosis not present

## 2022-04-01 DIAGNOSIS — I517 Cardiomegaly: Secondary | ICD-10-CM | POA: Diagnosis not present

## 2022-04-01 DIAGNOSIS — D508 Other iron deficiency anemias: Secondary | ICD-10-CM

## 2022-04-01 DIAGNOSIS — R918 Other nonspecific abnormal finding of lung field: Secondary | ICD-10-CM

## 2022-04-01 DIAGNOSIS — I700 Atherosclerosis of aorta: Secondary | ICD-10-CM | POA: Diagnosis not present

## 2022-04-01 NOTE — Progress Notes (Signed)
Provider: Nelia Rogoff FNP-C  Lauree Chandler, NP  Patient Care Team: Lauree Chandler, NP as PCP - General (Geriatric Medicine) Druscilla Brownie, MD as Consulting Physician (Dermatology) [name] Irani, MD (Inactive) as Consulting Physician (Gastroenterology) Neldon Mc, MD (Inactive) as Consulting Physician (General Surgery) Lorretta Harp, MD as Consulting Physician (Cardiology) Bjorn Loser, MD as Consulting Physician (Urology) Pieter Partridge, DO as Consulting Physician (Neurology)  Extended Emergency Contact Information Primary Emergency Contact: Creola of [place] Phone: [phone] Mobile Phone: [phone] Relation: Daughter Secondary Emergency Contact: Acelynn, [place] Mobile Phone: [phone] Relation: Son  Code Status:  Full Code  Goals of care: Advanced Directive information    04/01/2022    2:58 PM  Advanced Directives  Does Patient Have a Medical Advance Directive? No  Would patient like information on creating a medical advance directive? No - Patient declined     Chief Complaint  Patient presents with   Hospitalization [facility] follow up 03/22/2022.   HPI:  Pt is a 89 y.o. female seen today for an acute visit for ED visit 03/22/2022 follow up for chest pain/Abdominal pain with radiation to the back.Had 1+ pitting edema.Troponin x 2 was normal.Also Hypokalemia 3.1 which was replete with oral potassium.thought to be due to GI.she was started on Protonix ECG showed Afib.CT of the chest,ABD,and pelvis suggestive of cirrhosis.Also noted multiple tiny pulmonary nodules scattered throughout the bilateral lung measuring 1-3 mm in size.chest CT scan recommended in 12 months. chest X-ray showed small right pleural effusion,Cardiomegaly and Aortic atherosclerosis. She denies any acute issues this visit.states feeling much better.    Past Medical History:  Diagnosis Date   Aortic stenosis 07/20/2012   Aortic  stenosis    Arthritis    Cancer (HCC)    Breast   Carotid bruit 08/06/2008   Doppler - R ECA demonstrates noarrowing w/ elevated velocities consistent w/ >70% diameter reduction; R and L ICAs show no evidence of diameter reduction, significant tortuosity or vascular abnormality;    COPD (chronic obstructive pulmonary disease) (HCC)    Hearing loss    Heart murmur    Hyperlipidemia    Hypertension    dr Gwenlyn Found   Osteoporosis    PAF (paroxysmal atrial fibrillation) ([place]) 09/04/2012   PVD (peripheral vascular disease) ([place]) 08/13/2010   R/P MV - normal pattern of perfusion in all regions, EF 76%; no significant wall abnormalities noted; normal perfusion study   Right carotid bruit    high-grade right external carotid artery stenosis by 2 parts ultrasound 2 years ago   S/P aortic valve replacement with bioprosthetic valve 08/30/2012   21 mm [facility] Ease bovine pericardial tissue valve   Stroke (cerebrum) [facility]    Past Surgical History:  Procedure Laterality Date   ABDOMINAL HYSTERECTOMY     Partial   AORTIC VALVE REPLACEMENT N/A 08/30/2012   Procedure: AORTIC VALVE REPLACEMENT (AVR);  Surgeon: Rexene Alberts, MD;  Location: [place];  Service: Open Heart Surgery;  Laterality: N/A;   BIOPSY SHOULDER Left 10/08/2005   shave biopsy   BREAST LUMPECTOMY  02/25/1997   right   CARDIAC CATHETERIZATION     CHOLECYSTECTOMY N/A 01/25/2022   Procedure: LAPAROSCOPIC CHOLECYSTECTOMY;  Surgeon: Ralene Ok, MD;  Location: [place];  Service: General;  Laterality: N/A;   [place]   Cataract surgery   INTRAOPERATIVE TRANSESOPHAGEAL ECHOCARDIOGRAM  N/A 08/30/2012   Procedure: INTRAOPERATIVE TRANSESOPHAGEAL ECHOCARDIOGRAM;  Surgeon: Rexene Alberts, MD;  Location: [place];  Service: Open Heart Surgery;  Laterality: N/A;   LEFT AND RIGHT HEART CATHETERIZATION WITH CORONARY ANGIOGRAM N/A 08/14/2012   Procedure: LEFT AND RIGHT  HEART CATHETERIZATION WITH CORONARY ANGIOGRAM;  Surgeon: Lorretta Harp, MD;  Location: [facility] CATH LAB;  Service: Cardiovascular;  Laterality: N/A;   LEFT HEART CATH  08/14/12   Nl cors, AS   MASTECTOMY  09/29/95   left   PARTIAL HYSTERECTOMY     TOTAL HIP ARTHROPLASTY  09/2010    Allergies  Allergen Reactions   Lisinopril Cough   Ceftriaxone Rash   Codeine Rash    All over the body.    Outpatient Encounter Medications as of 04/01/2022  Medication Sig   acetaminophen (TYLENOL) 650 MG CR tablet Take 1 tablet (650 mg total) by mouth 2 (two) times daily.   apixaban (ELIQUIS) 5 MG TABS tablet Take 1 tablet (5 mg total) by mouth 2 (two) times daily.   Ascorbic Acid (VITAMIN C) 100 MG tablet Take 100 mg by mouth daily.   atorvastatin (LIPITOR) 40 MG tablet Take 1 tablet (40 mg total) by mouth daily.   Calcium Citrate-Vitamin D (CITRACAL + D PO) Take 1 tablet by mouth daily.   diclofenac Sodium (VOLTAREN) 1 % GEL Apply 4 g topically 4 (four) times daily as needed.   Magnesium Hydroxide (PHILLIPS MILK OF MAGNESIA PO) Take 15 mLs by mouth at bedtime. Take daily at 6 o'clock at night   metoprolol tartrate (LOPRESSOR) 25 MG tablet Take 1 tablet (25 mg total) by mouth 2 (two) times daily.   mirabegron ER (MYRBETRIQ) 50 MG TB24 tablet Take 1 tablet (50 mg total) by mouth daily.   pantoprazole (PROTONIX) 20 MG tablet Take 1 tablet (20 mg total) by mouth daily.   potassium chloride SA (KLOR-CON M) 20 MEQ tablet Take 1 tablet (20 mEq total) by mouth 2 (two) times daily.   valsartan (DIOVAN) 160 MG tablet Take 1 tablet (160 mg total) by mouth in the morning.   valsartan (DIOVAN) 80 MG tablet Take 1 tablet (80 mg total) by mouth every evening.   No facility-administered encounter medications on file as of 04/01/2022.    Review of Systems  Constitutional:  Negative for appetite change, chills, fatigue, fever and unexpected weight change.  HENT:  Negative for congestion, dental problem, ear discharge, ear  pain, facial swelling, hearing loss, nosebleeds, postnasal drip, rhinorrhea, sinus pressure, sinus pain, sneezing, sore throat, tinnitus and trouble swallowing.   Eyes:  Negative for pain, discharge, redness, itching and visual disturbance.  Respiratory:  Negative for cough, chest tightness, shortness of breath and wheezing.   Cardiovascular:  Negative for chest pain, palpitations and leg swelling.  Gastrointestinal:  Negative for abdominal distention, abdominal pain, blood in stool, constipation, diarrhea, nausea and vomiting.  Genitourinary:  Negative for difficulty urinating, dysuria, flank pain, frequency and urgency.  Musculoskeletal:  Positive for arthralgias and gait problem. Negative for back pain, joint swelling, myalgias, neck pain and neck stiffness.       Left shoulder pain   Skin:  Negative for color change, pallor, rash and wound.  Neurological:  Negative for dizziness, syncope, speech difficulty, weakness, light-headedness, numbness and headaches.  Hematological:  Does not bruise/bleed easily.  Psychiatric/Behavioral:  Negative for agitation, behavioral problems, confusion, hallucinations and sleep disturbance. The patient is not nervous/anxious.     Immunization History  Administered Date(s) Administered  Fluad Quad(high Dose 65+) 12/20/2018, 11/12/2019, 01/02/2021, 12/25/2021   Influenza,inj,Quad PF,6+ Mos 11/29/2012, 11/27/2013, 11/27/2014, 12/09/2015, 01/11/2017, 12/20/2017   PFIZER(Purple Top)SARS-COV-2 Vaccination 11/26/2019, 12/17/2019, 07/14/2020   Pneumococcal Conjugate-13 05/21/2014   Pneumococcal Polysaccharide-23 01/11/2005, 08/15/2012   Tdap 05/28/2014   Zoster Recombinat (Shingrix) 06/24/2017, 11/02/2017   Zoster, Live 01/14/2009   Pertinent  Health Maintenance Due  Topic Date Due   INFLUENZA VACCINE  Completed   DEXA SCAN  Completed      01/26/2022    9:00 PM 01/27/2022    7:00 AM 01/27/2022    7:45 PM 01/28/2022   12:00 PM 04/01/2022    2:57 PM  Fall  Risk  Falls in the past year?     0  Was there an injury with Fall?     0  Fall Risk Category Calculator     0  (RETIRED) Patient Fall Risk Level High fall risk High fall risk High fall risk High fall risk   Patient at Risk for Falls Due to     No Fall Risks  Fall risk Follow up     Falls evaluation completed   Functional Status Survey:    Vitals:   04/01/22 1456  BP: 110/80  Pulse: 68  Resp: 16  Temp: (!) 97.5 F (36.4 C)  SpO2: 95%  Weight: 153 lb 9.6 oz (69.7 kg)  Height: 5' 3"$  (1.6 m)   Body mass index is 27.21 kg/m. Physical Exam Vitals reviewed.  Constitutional:      General: She is not in acute distress.    Appearance: Normal appearance. She is overweight. She is not ill-appearing or diaphoretic.  HENT:     Head: Normocephalic.     Right Ear: Tympanic membrane, ear canal and external ear normal. There is no impacted cerumen.     Left Ear: Tympanic membrane, ear canal and external ear normal. There is no impacted cerumen.     Nose: Nose normal. No congestion or rhinorrhea.     Mouth/Throat:     Mouth: Mucous membranes are moist.     Pharynx: Oropharynx is clear. No oropharyngeal exudate or posterior oropharyngeal erythema.  Eyes:     General: No scleral icterus.       Right eye: No discharge.        Left eye: No discharge.     Extraocular Movements: Extraocular movements intact.     Conjunctiva/sclera: Conjunctivae normal.     Pupils: Pupils are equal, round, and reactive to light.  Neck:     Vascular: No carotid bruit.  Cardiovascular:     Rate and Rhythm: Normal rate and regular rhythm.     Pulses: Normal pulses.     Heart sounds: Normal heart sounds. No murmur heard.    No friction rub. No gallop.  Pulmonary:     Effort: Pulmonary effort is normal. No respiratory distress.     Breath sounds: Normal breath sounds. No wheezing, rhonchi or rales.  Chest:     Chest wall: No tenderness.  Abdominal:     General: Bowel sounds are normal. There is no  distension.     Palpations: Abdomen is soft. There is no mass.     Tenderness: There is no abdominal tenderness. There is no right CVA tenderness, left CVA tenderness, guarding or rebound.  Musculoskeletal:        General: No swelling.     Right shoulder: Normal.     Left shoulder: Tenderness present. No swelling, effusion or crepitus. Decreased range  of motion. Normal strength. Normal pulse.     Cervical back: Normal range of motion. No rigidity or tenderness.     Right lower leg: No edema.     Left lower leg: No edema.  Lymphadenopathy:     Cervical: No cervical adenopathy.  Skin:    General: Skin is warm and dry.     Coloration: Skin is not pale.     Findings: No bruising, erythema, lesion or rash.  Neurological:     Mental Status: She is alert. Mental status is at baseline.     Cranial Nerves: No cranial nerve deficit.     Sensory: No sensory deficit.     Motor: No weakness.     Coordination: Coordination normal.     Gait: Gait abnormal.  Psychiatric:        Mood and Affect: Mood normal.        Speech: Speech normal.        Behavior: Behavior normal.     Labs reviewed: Recent Labs    01/24/22 0438 01/25/22 0415 01/26/22 0725 01/27/22 0406 03/22/22 2132  NA 144   < > 140 133* 139  K 4.1   < > 4.1 3.6 3.1*  CL 105   < > 108 103 103  CO2 25   < > 24 21* 23  GLUCOSE 114*   < > 147* 114* 110*  BUN 16   < > 16 20 10  $ CREATININE 1.26*   < > 1.15* 1.36* 1.11*  CALCIUM 9.5   < > 9.0 9.3 9.4  MG 1.9  --   --   --   --    < > = values in this interval not displayed.   Recent Labs    01/26/22 0725 01/27/22 0406 03/22/22 2132  AST 194* 116* 61*  ALT 247* 200* 29  ALKPHOS 133* 120 80  BILITOT 1.0 0.9 0.9  PROT 5.6* 6.2* 5.9*  ALBUMIN 3.3* 3.6 3.7   Recent Labs    12/25/21 1437 01/24/22 0438 01/25/22 0415 01/27/22 0406 01/28/22 0534 03/22/22 2132  WBC 7.7 9.2   < > 14.4* 11.6* 7.5  NEUTROABS 4,558 6.6  --   --  9.0*  --   HGB 12.6 12.2   < > 11.9* 11.3*  11.6*  HCT 37.1 36.4   < > 36.2 33.6* 34.6*  MCV 96.4 99.7   < > 100.0 98.2 101.2*  PLT 170 145*   < > 166 177 158   < > = values in this interval not displayed.   Lab Results  Component Value Date   TSH 0.85 07/03/2021   Lab Results  Component Value Date   HGBA1C 5.3 06/24/2020   Lab Results  Component Value Date   CHOL 140 07/03/2021   HDL 60 07/03/2021   LDLCALC 65 07/03/2021   TRIG 72 07/03/2021   CHOLHDL 2.3 07/03/2021    Significant Diagnostic Results in last 30 days:  CT Abdomen Pelvis W Contrast  Result Date: 03/23/2022 CLINICAL DATA:  89 year old female with history of substernal chest pain and right lower quadrant abdominal pain. EXAM: CT ANGIOGRAPHY CHEST CT ABDOMEN AND PELVIS WITH CONTRAST TECHNIQUE: Multidetector CT imaging of the chest was performed using the standard protocol during bolus administration of intravenous contrast. Multiplanar CT image reconstructions and MIPs were obtained to evaluate the vascular anatomy. Multidetector CT imaging of the abdomen and pelvis was performed using the standard protocol during bolus administration of intravenous contrast. RADIATION DOSE REDUCTION: This  exam was performed according to the departmental dose-optimization program which includes automated exposure control, adjustment of the mA and/or kV according to patient size and/or use of iterative reconstruction technique. CONTRAST:  77m OMNIPAQUE IOHEXOL 350 MG/ML SOLN COMPARISON:  Chest CTA 09/16/2017. No prior CT of the abdomen and pelvis. FINDINGS: CTA CHEST FINDINGS Cardiovascular: There are no filling defects within the pulmonary arterial tree to suggest pulmonary embolism. Heart size is enlarged with biatrial dilatation. There is no significant pericardial fluid, thickening or pericardial calcification. There is aortic atherosclerosis, as well as atherosclerosis of the great vessels of the mediastinum and the coronary arteries, including calcified atherosclerotic plaque in the  left main, left anterior descending and right coronary arteries. Calcifications of the mitral annulus. Status post median sternotomy for aortic valve replacement with what appears to be a stented bioprosthesis. Mediastinum/Nodes: No pathologically enlarged mediastinal or hilar lymph nodes. Esophagus is unremarkable in appearance. No axillary lymphadenopathy. Lungs/Pleura: Small right pleural effusion lying dependently. No left pleural effusion. No acute consolidative airspace disease. Numerous tiny 1-3 mm pulmonary nodules scattered throughout the periphery of both lungs, nonspecific, but statistically likely benign areas of mucoid impaction within terminal bronchioles. The largest pulmonary nodule measures 3 mm in the periphery of the right upper lobe (axial image 37 of series 6), stable compared to prior examination from 09/16/2017, considered definitively benign. Many of the other small pulmonary nodules can not be confirmed on the prior examination, but are also likely areas of mucoid impaction. Musculoskeletal: Median sternotomy wires. Status post left modified radical mastectomy and left breast reconstruction with a left-sided implants in a subpectoral location. There are no aggressive appearing lytic or blastic lesions noted in the visualized portions of the skeleton. Review of the MIP images confirms the above findings. CT ABDOMEN and PELVIS FINDINGS Hepatobiliary: Liver has a slightly shrunken appearance and nodular contour, indicative of underlying cirrhosis. No discrete cystic or solid hepatic lesions. Mild periportal edema. No intra or extrahepatic biliary ductal dilatation. Status post cholecystectomy. Pancreas: No pancreatic mass. No pancreatic ductal dilatation. No pancreatic or peripancreatic fluid collections or inflammatory changes. Spleen: Unremarkable. Adrenals/Urinary Tract: Bilateral kidneys and adrenal glands are normal in appearance. No hydroureteronephrosis. Urinary bladder is partially  obscured by beam hardening artifact from the patient's right hip arthroplasty, but visualized portions are unremarkable. Stomach/Bowel: The appearance of the stomach is normal. There is no pathologic dilatation of small bowel or colon. Numerous colonic diverticula are noted, without definite focal surrounding inflammatory changes to indicate an acute diverticulitis at this time. The appendix is not confidently identified and may be surgically absent. Regardless, there are no inflammatory changes noted adjacent to the cecum to suggest the presence of an acute appendicitis at this time. Vascular/Lymphatic: Aortic atherosclerosis, without evidence of aneurysm or dissection in the abdominal or pelvic vasculature. Retroaortic left renal vein (normal anatomical variant) incidentally noted. No lymphadenopathy noted in the abdomen or pelvis. Reproductive: Status post hysterectomy.  Ovaries are atrophic. Other: No significant volume of ascites.  No pneumoperitoneum. Musculoskeletal: Status post right hip arthroplasty. There are no aggressive appearing lytic or blastic lesions noted in the visualized portions of the skeleton. Review of the MIP images confirms the above findings. IMPRESSION: 1. No evidence of pulmonary embolism. 2. Morphologic changes in the liver indicative of underlying cirrhosis. There is also periportal edema in the liver, which is nonspecific, but warrants correlation with liver function tests. No other acute findings are noted in the abdomen or pelvis to account for the patient's symptoms. 3. Small  right pleural effusion lying dependently. 4. Multiple tiny pulmonary nodules scattered throughout the lungs bilaterally measuring 1-3 mm in size, nonspecific, but statistically likely benign areas of mucoid impaction within terminal bronchioles. No follow-up needed if patient is low-risk (and has no known or suspected primary neoplasm). Non-contrast chest CT can be considered in 12 months if patient is  high-risk. This recommendation follows the consensus statement: Guidelines for Management of Incidental Pulmonary Nodules Detected on CT Images: From the Fleischner Society 2017; Radiology 2017; 284:228-243. 5. Colonic diverticulosis without evidence of acute diverticulitis at this time. 6. Cardiomegaly with biatrial dilatation. 7. There are calcifications of the mitral annulus. Echocardiographic correlation for evaluation of potential valvular dysfunction may be warranted if clinically indicated. 8. Aortic atherosclerosis, in addition to left main and 2 vessel coronary artery disease. 9. Additional incidental findings, as above. Electronically Signed   By: Vinnie Langton M.D.   On: 03/23/2022 05:32   CT Angio Chest PE W and/or Wo Contrast  Result Date: 03/23/2022 CLINICAL DATA:  89 year old female with history of substernal chest pain and right lower quadrant abdominal pain. EXAM: CT ANGIOGRAPHY CHEST CT ABDOMEN AND PELVIS WITH CONTRAST TECHNIQUE: Multidetector CT imaging of the chest was performed using the standard protocol during bolus administration of intravenous contrast. Multiplanar CT image reconstructions and MIPs were obtained to evaluate the vascular anatomy. Multidetector CT imaging of the abdomen and pelvis was performed using the standard protocol during bolus administration of intravenous contrast. RADIATION DOSE REDUCTION: This exam was performed according to the departmental dose-optimization program which includes automated exposure control, adjustment of the mA and/or kV according to patient size and/or use of iterative reconstruction technique. CONTRAST:  86m OMNIPAQUE IOHEXOL 350 MG/ML SOLN COMPARISON:  Chest CTA 09/16/2017. No prior CT of the abdomen and pelvis. FINDINGS: CTA CHEST FINDINGS Cardiovascular: There are no filling defects within the pulmonary arterial tree to suggest pulmonary embolism. Heart size is enlarged with biatrial dilatation. There is no significant pericardial  fluid, thickening or pericardial calcification. There is aortic atherosclerosis, as well as atherosclerosis of the great vessels of the mediastinum and the coronary arteries, including calcified atherosclerotic plaque in the left main, left anterior descending and right coronary arteries. Calcifications of the mitral annulus. Status post median sternotomy for aortic valve replacement with what appears to be a stented bioprosthesis. Mediastinum/Nodes: No pathologically enlarged mediastinal or hilar lymph nodes. Esophagus is unremarkable in appearance. No axillary lymphadenopathy. Lungs/Pleura: Small right pleural effusion lying dependently. No left pleural effusion. No acute consolidative airspace disease. Numerous tiny 1-3 mm pulmonary nodules scattered throughout the periphery of both lungs, nonspecific, but statistically likely benign areas of mucoid impaction within terminal bronchioles. The largest pulmonary nodule measures 3 mm in the periphery of the right upper lobe (axial image 37 of series 6), stable compared to prior examination from 09/16/2017, considered definitively benign. Many of the other small pulmonary nodules can not be confirmed on the prior examination, but are also likely areas of mucoid impaction. Musculoskeletal: Median sternotomy wires. Status post left modified radical mastectomy and left breast reconstruction with a left-sided implants in a subpectoral location. There are no aggressive appearing lytic or blastic lesions noted in the visualized portions of the skeleton. Review of the MIP images confirms the above findings. CT ABDOMEN and PELVIS FINDINGS Hepatobiliary: Liver has a slightly shrunken appearance and nodular contour, indicative of underlying cirrhosis. No discrete cystic or solid hepatic lesions. Mild periportal edema. No intra or extrahepatic biliary ductal dilatation. Status post cholecystectomy. Pancreas: No pancreatic  mass. No pancreatic ductal dilatation. No pancreatic or  peripancreatic fluid collections or inflammatory changes. Spleen: Unremarkable. Adrenals/Urinary Tract: Bilateral kidneys and adrenal glands are normal in appearance. No hydroureteronephrosis. Urinary bladder is partially obscured by beam hardening artifact from the patient's right hip arthroplasty, but visualized portions are unremarkable. Stomach/Bowel: The appearance of the stomach is normal. There is no pathologic dilatation of small bowel or colon. Numerous colonic diverticula are noted, without definite focal surrounding inflammatory changes to indicate an acute diverticulitis at this time. The appendix is not confidently identified and may be surgically absent. Regardless, there are no inflammatory changes noted adjacent to the cecum to suggest the presence of an acute appendicitis at this time. Vascular/Lymphatic: Aortic atherosclerosis, without evidence of aneurysm or dissection in the abdominal or pelvic vasculature. Retroaortic left renal vein (normal anatomical variant) incidentally noted. No lymphadenopathy noted in the abdomen or pelvis. Reproductive: Status post hysterectomy.  Ovaries are atrophic. Other: No significant volume of ascites.  No pneumoperitoneum. Musculoskeletal: Status post right hip arthroplasty. There are no aggressive appearing lytic or blastic lesions noted in the visualized portions of the skeleton. Review of the MIP images confirms the above findings. IMPRESSION: 1. No evidence of pulmonary embolism. 2. Morphologic changes in the liver indicative of underlying cirrhosis. There is also periportal edema in the liver, which is nonspecific, but warrants correlation with liver function tests. No other acute findings are noted in the abdomen or pelvis to account for the patient's symptoms. 3. Small right pleural effusion lying dependently. 4. Multiple tiny pulmonary nodules scattered throughout the lungs bilaterally measuring 1-3 mm in size, nonspecific, but statistically likely benign  areas of mucoid impaction within terminal bronchioles. No follow-up needed if patient is low-risk (and has no known or suspected primary neoplasm). Non-contrast chest CT can be considered in 12 months if patient is high-risk. This recommendation follows the consensus statement: Guidelines for Management of Incidental Pulmonary Nodules Detected on CT Images: From the Fleischner Society 2017; Radiology 2017; 284:228-243. 5. Colonic diverticulosis without evidence of acute diverticulitis at this time. 6. Cardiomegaly with biatrial dilatation. 7. There are calcifications of the mitral annulus. Echocardiographic correlation for evaluation of potential valvular dysfunction may be warranted if clinically indicated. 8. Aortic atherosclerosis, in addition to left main and 2 vessel coronary artery disease. 9. Additional incidental findings, as above. Electronically Signed   By: Vinnie Langton M.D.   On: 03/23/2022 05:32   DG Chest 2 View  Result Date: 03/22/2022 CLINICAL DATA:  Right-sided chest pain EXAM: CHEST - 2 VIEW COMPARISON:  Chest x-ray 01/24/2022.  Chest CT 09/16/2017. FINDINGS: The heart is enlarged. Patient is status post aortic valve replacement. There is no focal lung infiltrate. There is a small right pleural effusion. There is no pneumothorax. Biapical pleural calcifications are again seen. No acute fractures are identified. There are atherosclerotic calcifications throughout the aorta. IMPRESSION: 1. Small right pleural effusion. 2. Cardiomegaly. 3. Aortic atherosclerosis. Electronically Signed   By: Ronney Asters M.D.   On: 03/22/2022 22:25    Assessment/Plan 1. Hypokalemia K+ 3.1 was replete in the ED - Basic metabolic panel  2. Iron deficiency anemia secondary to inadequate dietary iron intake Hgb Stable  - CBC with Differential/Platelet  3. PAF (paroxysmal atrial fibrillation) (HCC) HR irregular  Continue on apixaban   4. Cardiomegaly Noted on chest X-ray.No signs of fluid  overload.  5. Aortic atherosclerosis ([place]) Noted on recent Chest CT scan. Continue on atorvastatin   6. Diverticulosis of colon Noted on recent ABD  CT scan  Continue to monitor  7. Multiple pulmonary nodules Noted on recent chest CT scan Will refer to pulmonologist for further evaluation and establish care.  - Ambulatory referral to Pulmonology  8. Pleural effusion No shortness of breath noted. - Ambulatory referral to Pulmonology  Family/ staff Communication: Reviewed plan of care with patient and daughter verbalized understanding   Labs/tests ordered:  - Basic metabolic panel - CBC with Differential/Platelet  Next Appointment:Return if symptoms worsen or fail to improve.    Sandrea Hughs, NP

## 2022-04-02 LAB — BASIC METABOLIC PANEL
BUN/Creatinine Ratio: 10 (calc) (ref 6–22)
BUN: 11 mg/dL (ref 7–25)
CO2: 27 mmol/L (ref 20–32)
Calcium: 9.7 mg/dL (ref 8.6–10.4)
Chloride: 107 mmol/L (ref 98–110)
Creat: 1.11 mg/dL — ABNORMAL HIGH (ref 0.60–0.95)
Glucose, Bld: 90 mg/dL (ref 65–99)
Potassium: 4.7 mmol/L (ref 3.5–5.3)
Sodium: 141 mmol/L (ref 135–146)

## 2022-04-02 LAB — CBC WITH DIFFERENTIAL/PLATELET
Absolute Monocytes: 429 cells/uL (ref 200–950)
Basophils Absolute: 53 cells/uL (ref 0–200)
Basophils Relative: 0.8 %
Eosinophils Absolute: 178 cells/uL (ref 15–500)
Eosinophils Relative: 2.7 %
HCT: 33.7 % — ABNORMAL LOW (ref 35.0–45.0)
Hemoglobin: 11.6 g/dL — ABNORMAL LOW (ref 11.7–15.5)
Lymphs Abs: 1300 cells/uL (ref 850–3900)
MCH: 34 pg — ABNORMAL HIGH (ref 27.0–33.0)
MCHC: 34.4 g/dL (ref 32.0–36.0)
MCV: 98.8 fL (ref 80.0–100.0)
MPV: 11 fL (ref 7.5–12.5)
Monocytes Relative: 6.5 %
Neutro Abs: 4640 cells/uL (ref 1500–7800)
Neutrophils Relative %: 70.3 %
Platelets: 162 10*3/uL (ref 140–400)
RBC: 3.41 10*6/uL — ABNORMAL LOW (ref 3.80–5.10)
RDW: 13 % (ref 11.0–15.0)
Total Lymphocyte: 19.7 %
WBC: 6.6 10*3/uL (ref 3.8–10.8)

## 2022-04-07 ENCOUNTER — Other Ambulatory Visit (HOSPITAL_COMMUNITY): Payer: Self-pay

## 2022-04-15 ENCOUNTER — Other Ambulatory Visit: Payer: Self-pay | Admitting: Family

## 2022-04-15 ENCOUNTER — Other Ambulatory Visit (HOSPITAL_COMMUNITY): Payer: Self-pay

## 2022-04-15 DIAGNOSIS — N3281 Overactive bladder: Secondary | ICD-10-CM

## 2022-04-15 MED ORDER — MIRABEGRON ER 50 MG PO TB24
50.0000 mg | ORAL_TABLET | Freq: Every day | ORAL | 1 refills | Status: DC
  Filled 2022-04-15: qty 90, 90d supply, fill #0
  Filled 2022-04-19: qty 30, 30d supply, fill #0
  Filled 2022-05-26: qty 30, 30d supply, fill #1
  Filled 2022-11-05: qty 30, 30d supply, fill #2

## 2022-04-16 ENCOUNTER — Other Ambulatory Visit (HOSPITAL_COMMUNITY): Payer: Self-pay

## 2022-04-19 ENCOUNTER — Other Ambulatory Visit (HOSPITAL_COMMUNITY): Payer: Self-pay

## 2022-04-30 ENCOUNTER — Other Ambulatory Visit: Payer: Self-pay | Admitting: Family

## 2022-04-30 ENCOUNTER — Other Ambulatory Visit (HOSPITAL_COMMUNITY): Payer: Self-pay

## 2022-04-30 DIAGNOSIS — Z8673 Personal history of transient ischemic attack (TIA), and cerebral infarction without residual deficits: Secondary | ICD-10-CM

## 2022-05-03 ENCOUNTER — Other Ambulatory Visit (HOSPITAL_COMMUNITY): Payer: Self-pay

## 2022-05-03 MED ORDER — ATORVASTATIN CALCIUM 40 MG PO TABS
40.0000 mg | ORAL_TABLET | Freq: Every day | ORAL | 1 refills | Status: DC
  Filled 2022-05-03: qty 90, 90d supply, fill #0
  Filled 2022-08-13: qty 90, 90d supply, fill #1

## 2022-05-05 ENCOUNTER — Other Ambulatory Visit: Payer: Self-pay

## 2022-05-05 ENCOUNTER — Other Ambulatory Visit (HOSPITAL_COMMUNITY): Payer: Self-pay

## 2022-05-05 MED ORDER — ALBUTEROL SULFATE HFA 108 (90 BASE) MCG/ACT IN AERS
2.0000 | INHALATION_SPRAY | Freq: Four times a day (QID) | RESPIRATORY_TRACT | 0 refills | Status: AC | PRN
  Filled 2022-05-05: qty 6.7, 25d supply, fill #0

## 2022-05-07 ENCOUNTER — Ambulatory Visit
Admission: RE | Admit: 2022-05-07 | Discharge: 2022-05-07 | Disposition: A | Discharge status: Home / Self Care | Payer: Medicare Other | Source: Ambulatory Visit | Attending: Family | Admitting: Family

## 2022-05-07 ENCOUNTER — Encounter: Payer: Self-pay | Admitting: Family

## 2022-05-07 ENCOUNTER — Ambulatory Visit (INDEPENDENT_AMBULATORY_CARE_PROVIDER_SITE_OTHER): Payer: Medicare Other | Admitting: Family

## 2022-05-07 VITALS — BP 140/80 | HR 94 | Temp 97.6°F | Wt 160.2 lb

## 2022-05-07 DIAGNOSIS — R0602 Shortness of breath: Secondary | ICD-10-CM | POA: Diagnosis not present

## 2022-05-07 DIAGNOSIS — R0609 Other forms of dyspnea: Secondary | ICD-10-CM

## 2022-05-07 DIAGNOSIS — J90 Pleural effusion, not elsewhere classified: Secondary | ICD-10-CM | POA: Diagnosis not present

## 2022-05-07 DIAGNOSIS — I517 Cardiomegaly: Secondary | ICD-10-CM | POA: Diagnosis not present

## 2022-05-07 NOTE — Progress Notes (Unsigned)
Provider: Jaxn Chiquito FNP-C  Lauree Chandler, NP  Patient Care Team: Lauree Chandler, NP as PCP - General (Geriatric Medicine) Druscilla Brownie, MD as Consulting Physician (Dermatology) [name] Irani, MD (Inactive) as Consulting Physician (Gastroenterology) Neldon Mc, MD (Inactive) as Consulting Physician (General Surgery) Lorretta Harp, MD as Consulting Physician (Cardiology) Bjorn Loser, MD as Consulting Physician (Urology) Pieter Partridge, DO as Consulting Physician (Neurology)  Extended Emergency Contact Information Primary Emergency Contact: [place] Phone: [phone] Mobile Phone: [phone] Relation: Daughter Secondary Emergency Contact: Yocelyne, [place] Mobile Phone: [phone] Relation: Son  Code Status:  Full Code  Goals of care: Advanced Directive information    05/07/2022    3:18 PM  Advanced Directives  Does Patient Have a Medical Advance Directive? No     Chief Complaint  Patient presents with   Acute Visit    Patient complains of breathing issues.     HPI:  Pt is a 89 y.o. female seen today for an acute visit for evaluation of shortness of breath.Here with the sound who provides HPI information.Patient very hard of hearing.she denies any cough.Son states leg edema has not worsen.wears knee high socks. She denies any fever,chills,cough,fatigue,body aches,runny nose,chest tightness,chest pain or palpitation.   Past Medical History:  Diagnosis Date   Aortic stenosis 07/20/2012   Aortic stenosis    Arthritis    Cancer (HCC)    Breast   Carotid bruit 08/06/2008   Doppler - R ECA demonstrates noarrowing w/ elevated velocities consistent w/ >70% diameter reduction; R and L ICAs show no evidence of diameter reduction, significant tortuosity or vascular abnormality;    COPD (chronic obstructive pulmonary disease) (HCC)    Hearing loss    Heart murmur    Hyperlipidemia    Hypertension    dr  Gwenlyn Found   Osteoporosis    PAF (paroxysmal atrial fibrillation) ([place]) 09/04/2012   PVD (peripheral vascular disease) ([place]) 08/13/2010   R/P MV - normal pattern of perfusion in all regions, EF 76%; no significant wall abnormalities noted; normal perfusion study   Right carotid bruit    high-grade right external carotid artery stenosis by 2 parts ultrasound 2 years ago   S/P aortic valve replacement with bioprosthetic valve 08/30/2012   21 mm [facility] Ease bovine pericardial tissue valve   Stroke (cerebrum) [facility])    Past Surgical History:  Procedure Laterality Date   ABDOMINAL HYSTERECTOMY     Partial   AORTIC VALVE REPLACEMENT N/A 08/30/2012   Procedure: AORTIC VALVE REPLACEMENT (AVR);  Surgeon: Rexene Alberts, MD;  Location: [place];  Service: Open Heart Surgery;  Laterality: N/A;   BIOPSY SHOULDER Left 10/08/2005   shave biopsy   BREAST LUMPECTOMY  02/25/1997   right   CARDIAC CATHETERIZATION     CHOLECYSTECTOMY N/A 01/25/2022   Procedure: LAPAROSCOPIC CHOLECYSTECTOMY;  Surgeon: Ralene Ok, MD;  Location: [place];  Service: General;  Laterality: N/A;   [place]   Cataract surgery   INTRAOPERATIVE TRANSESOPHAGEAL ECHOCARDIOGRAM N/A 08/30/2012   Procedure: INTRAOPERATIVE TRANSESOPHAGEAL ECHOCARDIOGRAM;  Surgeon: Rexene Alberts, MD;  Location: [place];  Service: Open Heart Surgery;  Laterality: N/A;   LEFT AND RIGHT HEART CATHETERIZATION WITH CORONARY ANGIOGRAM N/A 08/14/2012   Procedure: LEFT AND RIGHT HEART CATHETERIZATION WITH CORONARY ANGIOGRAM;  Surgeon: Lorretta Harp, MD;  Location: [facility] CATH LAB;  Service: Cardiovascular;  Laterality: N/A;   LEFT HEART CATH  08/14/12   Nl cors, AS   MASTECTOMY  09/29/95   left   PARTIAL HYSTERECTOMY     TOTAL HIP ARTHROPLASTY  09/2010    Allergies  Allergen Reactions   Lisinopril Cough   Ceftriaxone Rash   Codeine Rash    All over the body.    Outpatient  Encounter Medications as of 05/07/2022  Medication Sig   acetaminophen (TYLENOL) 650 MG CR tablet Take 1 tablet (650 mg total) by mouth 2 (two) times daily.   albuterol (VENTOLIN HFA) 108 (90 Base) MCG/ACT inhaler Inhale 2 puffs into the lungs every 6 (six) hours as needed for wheezing or shortness of breath.   apixaban (ELIQUIS) 5 MG TABS tablet Take 1 tablet (5 mg total) by mouth 2 (two) times daily.   Ascorbic Acid (VITAMIN C) 100 MG tablet Take 100 mg by mouth daily.   atorvastatin (LIPITOR) 40 MG tablet Take 1 tablet (40 mg total) by mouth daily.   Calcium Citrate-Vitamin D (CITRACAL + D PO) Take 1 tablet by mouth daily.   diclofenac Sodium (VOLTAREN) 1 % GEL Apply 4 g topically 4 (four) times daily as needed.   Magnesium Hydroxide (PHILLIPS MILK OF MAGNESIA PO) Take 15 mLs by mouth at bedtime. Take daily at 6 o'clock at night   metoprolol tartrate (LOPRESSOR) 25 MG tablet Take 1 tablet (25 mg total) by mouth 2 (two) times daily.   mirabegron ER (MYRBETRIQ) 50 MG TB24 tablet Take 1 tablet (50 mg total) by mouth daily.   pantoprazole (PROTONIX) 20 MG tablet Take 1 tablet (20 mg total) by mouth daily.   potassium chloride SA (KLOR-CON M) 20 MEQ tablet Take 1 tablet (20 mEq total) by mouth 2 (two) times daily.   valsartan (DIOVAN) 160 MG tablet Take 1 tablet (160 mg total) by mouth in the morning.   valsartan (DIOVAN) 80 MG tablet Take 1 tablet (80 mg total) by mouth every evening.   No facility-administered encounter medications on file as of 05/07/2022.    Review of Systems  Constitutional:  Negative for appetite change, chills, fatigue, fever and unexpected weight change.  HENT:  Positive for hearing loss. Negative for congestion, dental problem, ear discharge, ear pain, facial swelling, nosebleeds, postnasal drip, rhinorrhea, sinus pressure, sinus pain, sneezing, sore throat, tinnitus and trouble swallowing.   Eyes:  Negative for pain, discharge, redness, itching and visual disturbance.   Respiratory:  Negative for cough, chest tightness, shortness of breath and wheezing.        Dyspnea on exertion   Cardiovascular:  Positive for leg swelling. Negative for chest pain and palpitations.  Gastrointestinal:  Negative for abdominal distention, abdominal pain, constipation, diarrhea, nausea and vomiting.  Skin:  Negative for color change, pallor and rash.  Neurological:  Negative for dizziness, speech difficulty, weakness, light-headedness, numbness and headaches.    Immunization History  Administered Date(s) Administered   Fluad Quad(high Dose 65+) 12/20/2018, 11/12/2019, 01/02/2021, 12/25/2021   Influenza,inj,Quad PF,6+ Mos 11/29/2012, 11/27/2013, 11/27/2014, 12/09/2015, 01/11/2017, 12/20/2017   PFIZER(Purple Top)SARS-COV-2 Vaccination 11/26/2019, 12/17/2019, 07/14/2020   Pneumococcal Conjugate-13 05/21/2014   Pneumococcal Polysaccharide-23 01/11/2005, 08/15/2012   Tdap 05/28/2014   Zoster Recombinat (Shingrix) 06/24/2017, 11/02/2017   Zoster, Live 01/14/2009   Pertinent  Health Maintenance Due  Topic Date Due   INFLUENZA VACCINE  Completed   DEXA SCAN  Completed      01/27/2022    7:00 AM 01/27/2022    7:45 PM 01/28/2022  12:00 PM 04/01/2022    2:57 PM 05/07/2022    3:17 PM  Fall Risk  Falls in the past year?    0 1  Was there an injury with Fall?    0 1  Fall Risk Category Calculator    0 2  (RETIRED) Patient Fall Risk Level High fall risk High fall risk High fall risk    Patient at Risk for Falls Due to    No Fall Risks No Fall Risks  Fall risk Follow up    Falls evaluation completed Falls evaluation completed   Functional Status Survey:    Vitals:   05/07/22 1534  BP: (!) 140/80  Pulse: 94  Temp: 97.6 F (36.4 C)  TempSrc: Temporal  SpO2: 96%  Weight: 160 lb 3.2 oz (72.7 kg)   Body mass index is 28.38 kg/m. Physical Exam Vitals reviewed.  Constitutional:      General: She is not in acute distress.    Appearance: Normal appearance. She is  overweight. She is not ill-appearing or diaphoretic.  HENT:     Head: Normocephalic.     Right Ear: Tympanic membrane, ear canal and external ear normal. There is no impacted cerumen.     Left Ear: Tympanic membrane, ear canal and external ear normal. There is no impacted cerumen.     Nose: Nose normal. No congestion or rhinorrhea.     Mouth/Throat:     Mouth: Mucous membranes are moist.     Pharynx: Oropharynx is clear. No oropharyngeal exudate or posterior oropharyngeal erythema.  Eyes:     General: No scleral icterus.       Right eye: No discharge.        Left eye: No discharge.     Extraocular Movements: Extraocular movements intact.     Conjunctiva/sclera: Conjunctivae normal.     Pupils: Pupils are equal, round, and reactive to light.  Neck:     Vascular: No carotid bruit.  Cardiovascular:     Rate and Rhythm: Normal rate and regular rhythm.     Pulses: Normal pulses.     Heart sounds: Normal heart sounds. No murmur heard.    No friction rub. No gallop.  Pulmonary:     Effort: Pulmonary effort is normal. No respiratory distress.     Breath sounds: Normal breath sounds. No wheezing, rhonchi or rales.  Chest:     Chest wall: No tenderness.  Abdominal:     General: Bowel sounds are normal. There is no distension.     Palpations: Abdomen is soft. There is no mass.     Tenderness: There is no abdominal tenderness. There is no right CVA tenderness, left CVA tenderness, guarding or rebound.  Musculoskeletal:        General: Swelling present. No tenderness. Normal range of motion.     Cervical back: Normal range of motion. No rigidity or tenderness.     Right lower leg: No edema.     Left lower leg: No edema.  Lymphadenopathy:     Cervical: No cervical adenopathy.  Skin:    General: Skin is warm and dry.     Coloration: Skin is not pale.     Findings: No bruising, erythema, lesion or rash.  Neurological:     Mental Status: She is alert and oriented to person, place, and  time.     Cranial Nerves: No cranial nerve deficit.     Sensory: No sensory deficit.     Motor: No weakness.  Coordination: Coordination normal.     Gait: Gait normal.  Psychiatric:        Mood and Affect: Mood normal.        Speech: Speech normal.        Behavior: Behavior normal.     Labs reviewed: Recent Labs    01/24/22 0438 01/25/22 0415 01/27/22 0406 03/22/22 2132 04/01/22 1531  NA 144   < > 133* 139 141  K 4.1   < > 3.6 3.1* 4.7  CL 105   < > 103 103 107  CO2 25   < > 21* 23 27  GLUCOSE 114*   < > 114* 110* 90  BUN 16   < > '20 10 11  '$ CREATININE 1.26*   < > 1.36* 1.11* 1.11*  CALCIUM 9.5   < > 9.3 9.4 9.7  MG 1.9  --   --   --   --    < > = values in this interval not displayed.   Recent Labs    01/26/22 0725 01/27/22 0406 03/22/22 2132  AST 194* 116* 61*  ALT 247* 200* 29  ALKPHOS 133* 120 80  BILITOT 1.0 0.9 0.9  PROT 5.6* 6.2* 5.9*  ALBUMIN 3.3* 3.6 3.7   Recent Labs    01/24/22 0438 01/25/22 0415 01/28/22 0534 03/22/22 2132 04/01/22 1531  WBC 9.2   < > 11.6* 7.5 6.6  NEUTROABS 6.6  --  9.0*  --  4,640  HGB 12.2   < > 11.3* 11.6* 11.6*  HCT 36.4   < > 33.6* 34.6* 33.7*  MCV 99.7   < > 98.2 101.2* 98.8  PLT 145*   < > 177 158 162   < > = values in this interval not displayed.   Lab Results  Component Value Date   TSH 0.85 07/03/2021   Lab Results  Component Value Date   HGBA1C 5.3 06/24/2020   Lab Results  Component Value Date   CHOL 140 07/03/2021   HDL 60 07/03/2021   LDLCALC 65 07/03/2021   TRIG 72 07/03/2021   CHOLHDL 2.3 07/03/2021    Significant Diagnostic Results in last 30 days:  No results found.  Assessment/Plan 1. DOE (dyspnea on exertion) Suspect possible related to CHF given lower extremities edema and previous cardiomegaly. Will obtain imaging and check for fluid.  - DG Chest 2 View; Future - Brain Natriuretic Peptide - Basic metabolic panel  2. Cardiomegaly Per previous X-ray noted  - advised to monitor  weight at home and notify provider for any abrupt weight gain  - Keep legs elevated when seated to keep swelling down  - Reduce salt intake in diet  Family/ staff Communication: Reviewed plan of care with patient verbalized understanding   Labs/tests ordered:  - DG Chest 2 View; Future - Brain Natriuretic Peptide - Basic metabolic panel  Next Appointment: Return if symptoms worsen or fail to improve.   Sandrea Hughs, NP

## 2022-05-07 NOTE — Patient Instructions (Signed)
-   Please get chest X-ray at [facility] at [address] then will call you with results. ? ?

## 2022-05-08 LAB — BASIC METABOLIC PANEL
BUN: 10 mg/dL (ref 7–25)
CO2: 28 mmol/L (ref 20–32)
Calcium: 9.4 mg/dL (ref 8.6–10.4)
Chloride: 106 mmol/L (ref 98–110)
Creat: 0.92 mg/dL (ref 0.60–0.95)
Glucose, Bld: 155 mg/dL — ABNORMAL HIGH (ref 65–99)
Potassium: 3.5 mmol/L (ref 3.5–5.3)
Sodium: 144 mmol/L (ref 135–146)

## 2022-05-08 LAB — BRAIN NATRIURETIC PEPTIDE: Brain Natriuretic Peptide: 246 pg/mL — ABNORMAL HIGH (ref <100)

## 2022-05-11 ENCOUNTER — Telehealth: Payer: Self-pay

## 2022-05-11 ENCOUNTER — Other Ambulatory Visit (HOSPITAL_COMMUNITY): Payer: Self-pay

## 2022-05-11 MED ORDER — DOXYCYCLINE HYCLATE 100 MG PO TABS
100.0000 mg | ORAL_TABLET | Freq: Two times a day (BID) | ORAL | 0 refills | Status: AC
  Filled 2022-05-11: qty 14, 7d supply, fill #0

## 2022-05-11 MED ORDER — FUROSEMIDE 20 MG PO TABS
ORAL_TABLET | ORAL | 0 refills | Status: DC
  Filled 2022-05-11: qty 30, 30d supply, fill #0

## 2022-05-11 NOTE — Telephone Encounter (Signed)
-----   Message from Sandrea Hughs, NP sent at 05/11/2022 12:30 PM EDT ----- Checks x-ray shows small right pleural effusion with some consolidation indicating any pneumonia.  - start on doxycycline 100 mg tablet one by mouth twice daily x 7 days. - Take Furosemide 20 mg tablet one by mouth daily x 3 days then change to one by mouth daily as needed for shortness of breath  or wheezing or abrupt weight gain > 3 lbs.  - Potassium chloride 20 meq tablet one by mouth as needed along with Furosemide.

## 2022-05-14 ENCOUNTER — Other Ambulatory Visit: Payer: Self-pay | Admitting: Family

## 2022-05-14 ENCOUNTER — Other Ambulatory Visit (HOSPITAL_COMMUNITY): Payer: Self-pay

## 2022-05-14 DIAGNOSIS — E876 Hypokalemia: Secondary | ICD-10-CM

## 2022-05-14 DIAGNOSIS — R0609 Other forms of dyspnea: Secondary | ICD-10-CM

## 2022-05-14 MED ORDER — FUROSEMIDE 20 MG PO TABS
ORAL_TABLET | ORAL | 3 refills | Status: DC
  Filled 2022-05-14: qty 30, fill #0

## 2022-05-14 MED ORDER — POTASSIUM CHLORIDE CRYS ER 20 MEQ PO TBCR
20.0000 meq | EXTENDED_RELEASE_TABLET | Freq: Every day | ORAL | 3 refills | Status: DC | PRN
  Filled 2022-05-14: qty 30, 30d supply, fill #0

## 2022-05-17 ENCOUNTER — Other Ambulatory Visit (HOSPITAL_COMMUNITY): Payer: Self-pay

## 2022-05-26 ENCOUNTER — Other Ambulatory Visit (HOSPITAL_COMMUNITY): Payer: Self-pay

## 2022-06-04 ENCOUNTER — Ambulatory Visit (INDEPENDENT_AMBULATORY_CARE_PROVIDER_SITE_OTHER): Payer: Medicare Other | Admitting: Adult Health

## 2022-06-04 ENCOUNTER — Encounter: Payer: Self-pay | Admitting: Adult Health

## 2022-06-04 VITALS — BP 127/88 | HR 74 | Temp 98.2°F | Resp 18 | Ht 63.0 in | Wt 155.1 lb

## 2022-06-04 DIAGNOSIS — Z0000 Encounter for general adult medical examination without abnormal findings: Secondary | ICD-10-CM | POA: Diagnosis not present

## 2022-06-04 NOTE — Progress Notes (Signed)
Subjective:   [name] is a 89 y.o. female who presents for Medicare Annual (Subsequent) preventive examination.  Review of Systems          Objective:    There were no vitals filed for this visit. There is no height or weight on file to calculate BMI.     05/07/2022    3:18 PM 04/01/2022    2:58 PM 01/25/2022   12:26 PM 11/28/2020   11:22 AM 11/25/2020    3:30 PM 04/02/2020    3:36 PM 07/03/2019    2:26 PM  Advanced Directives  Does Patient Have a Medical Advance Directive? No No No No No No No  Does patient want to make changes to medical advance directive?    Yes (MAU/Ambulatory/Procedural Areas - Information given)     Would patient like information on creating a medical advance directive?  No - Patient declined No - Patient declined  Yes (MAU/Ambulatory/Procedural Areas - Information given) No - Patient declined Yes (MAU/Ambulatory/Procedural Areas - Information given)    Current Medications (verified) Outpatient Encounter Medications as of 06/04/2022  Medication Sig   acetaminophen (TYLENOL) 650 MG CR tablet Take 1 tablet (650 mg total) by mouth 2 (two) times daily.   albuterol (VENTOLIN HFA) 108 (90 Base) MCG/ACT inhaler Inhale 2 puffs into the lungs every 6 (six) hours as needed for wheezing or shortness of breath.   apixaban (ELIQUIS) 5 MG TABS tablet Take 1 tablet (5 mg total) by mouth 2 (two) times daily.   Ascorbic Acid (VITAMIN C) 100 MG tablet Take 100 mg by mouth daily.   atorvastatin (LIPITOR) 40 MG tablet Take 1 tablet (40 mg total) by mouth daily.   Calcium Citrate-Vitamin D (CITRACAL + D PO) Take 1 tablet by mouth daily.   diclofenac Sodium (VOLTAREN) 1 % GEL Apply 4 g topically 4 (four) times daily as needed.   furosemide (LASIX) 20 MG tablet Take 1 tablet by mouth once daily x 3 days,  then change to 1 tablet daily as needed for shortness of breath  or wheezing or abrupt weight gain > 3 lbs. take with potassium.   Magnesium Hydroxide (PHILLIPS MILK OF  MAGNESIA PO) Take 15 mLs by mouth at bedtime. Take daily at 6 o'clock at night   metoprolol tartrate (LOPRESSOR) 25 MG tablet Take 1 tablet (25 mg total) by mouth 2 (two) times daily.   mirabegron ER (MYRBETRIQ) 50 MG TB24 tablet Take 1 tablet (50 mg total) by mouth daily.   pantoprazole (PROTONIX) 20 MG tablet Take 1 tablet (20 mg total) by mouth daily.   potassium chloride SA (KLOR-CON M) 20 MEQ tablet Take 1 tablet (20 mEq total) by mouth daily as needed along with Furosemide.   valsartan (DIOVAN) 160 MG tablet Take 1 tablet (160 mg total) by mouth in the morning.   valsartan (DIOVAN) 80 MG tablet Take 1 tablet (80 mg total) by mouth every evening.   No facility-administered encounter medications on file as of 06/04/2022.    Allergies (verified) Lisinopril, Ceftriaxone, and Codeine   History: Past Medical History:  Diagnosis Date   Aortic stenosis 07/20/2012   Aortic stenosis    Arthritis    Cancer    Breast   Carotid bruit 08/06/2008   Doppler - R ECA demonstrates noarrowing w/ elevated velocities consistent w/ >70% diameter reduction; R and L ICAs show no evidence of diameter reduction, significant tortuosity or vascular abnormality;    COPD (chronic obstructive pulmonary disease)  Hearing loss    Heart murmur    Hyperlipidemia    Hypertension    dr Allyson SabalBerry   Osteoporosis    PAF (paroxysmal atrial fibrillation) 09/04/2012   PVD (peripheral vascular disease) 08/13/2010   R/P MV - normal pattern of perfusion in all regions, EF 76%; no significant wall abnormalities noted; normal perfusion study   Right carotid bruit    high-grade right external carotid artery stenosis by 2 parts ultrasound 2 years ago   S/P aortic valve replacement with bioprosthetic valve 08/30/2012   21 mm [facility] Magn[facility] E[facility] bovine pericardial tissue valve   Stroke (cerebrum)    Past Surgical History:  Procedure Laterality Date   ABDOMINAL HYSTERECTOMY     Partial   AORTIC VALVE REPLACEMENT N/A 08/30/2012    Procedure: AORTIC VALVE REPLACEMENT (AVR);  Surgeon: Purcell Nailslarence H [name], MD;  [facility];  Service: Open Heart Surgery;  Laterality: N/A;   BIOPSY SHOULDER Left 10/08/2005   shave biopsy   BREAST LUMPECTOMY  02/25/1997   right   CARDIAC CATHETERIZATION     CHOLECYSTECTOMY N/A 01/25/2022   Procedure: LAPAROSCOPIC CHOLECYSTECTOMY;  Surgeon: Axel Filleramirez, Armando, MD;  [facility];  Service: General;  Laterality: N/A;   COLON SURGERY  1959   COSMETIC SURGERY Left 1997   Breast implant   EYE SURGERY  1997   Cataract surgery   INTRAOPERATIVE TRANSESOPHAGEAL ECHOCARDIOGRAM N/A 08/30/2012   Procedure: INTRAOPERATIVE TRANSESOPHAGEAL ECHOCARDIOGRAM;  Surgeon: Purcell Nailslarence H [name], MD;  [facility];  Service: Open Heart Surgery;  Laterality: N/A;   LEFT AND RIGHT HEART CATHETERIZATION WITH CORONARY ANGIOGRAM N/A 08/14/2012   Procedure: LEFT AND RIGHT HEART CATHETERIZATION WITH CORONARY ANGIOGRAM;  Surgeon: Runell GessJonathan J Berry, MD;  [facility];  Service: Cardiovascular;  Laterality: N/A;   LEFT HEART CATH  08/14/12   Nl cors, AS   MASTECTOMY  09/29/95   left   PARTIAL HYSTERECTOMY     TOTAL HIP ARTHROPLASTY  09/2010   Family History  Problem Relation Age of Onset   Cancer Mother        Bladder   Early death Father        Tractor accident   Early death Brother        Tax inspectorHouse fire   Early death Brother        during heart surgery   Social History   Socioeconomic History   Marital status: Widowed    Spouse name: Not on file   Number of children: 2   Years of education: 9   Highest education level: Not on file  Occupational History   Occupation: retired  Tobacco Use   Smoking status: Never   Smokeless tobacco: Never  Vaping Use   Vaping Use: Never used  Substance and Sexual Activity   Alcohol use: No   Drug use: No   Sexual activity: Never  Other Topics Concern   Not on file  Social History Narrative   Right handed   One story home   Social Determinants of Health    Financial Resource Strain: Low Risk  (05/23/2017)   Overall Financial Resource Strain (CARDIA)    Difficulty of Paying Living Expenses: Not hard at all  Food Insecurity: No Food Insecurity (05/23/2017)   Hunger Vital Sign    Worried About Running Out of Food in the Last Year: Never true    Ran Out of Food in the Last Year: Never true  Transportation Needs: No Transportation Needs (05/23/2017)   PRAPARE -  Administrator, Civil Service (Medical): No    Lack of Transportation (Non-Medical): No  Physical Activity: Insufficiently Active (05/23/2017)   Exercise Vital Sign    Days of Exercise per Week: 7 days    Minutes of Exercise per Session: 10 min  Stress: No Stress Concern Present (05/23/2017)   [employer] of Occupational Health - Occupational Stress Questionnaire    Feeling of Stress : Only a little  Social Connections: Somewhat Isolated (05/23/2017)   Social Connection and Isolation Panel [NHANES]    Frequency of Communication with Friends and Family: More than three times a week    Frequency of Social Gatherings with Friends and Family: More than three times a week    Attends Religious Services: More than 4 times per year    Active Member of [employer] or Organizations: No    Attends Banker Meetings: Never    Marital Status: Widowed    Tobacco Counseling Counseling given: Not Answered   Clinical Intake:                 Diabetic?No         Activities of Daily Living     No data to display          Patient Care Team: Sharon Seller, NP as PCP - General (Geriatric Medicine) Cherlyn Roberts, MD as Consulting Physician (Dermatology) Dorena Cookey, MD (Inactive) as Consulting Physician (Gastroenterology) Cyndia Bent, MD (Inactive) as Consulting Physician (General Surgery) Runell Gess, MD as Consulting Physician (Cardiology) Alfredo Martinez, MD as Consulting Physician (Urology) Drema Dallas, DO as Consulting  Physician (Neurology)  Indicate any recent Medical Services you may have received from other than [facility] providers in the past year (date may be approximate).     Assessment:   This is a routine wellness examination for [place].  Hearing/Vision screen Hearing Screening - Comments:: Forgot to wear her hearing aids today  Dietary issues and exercise activities discussed:     Goals Addressed   None    Depression Screen    12/08/2021   10:01 AM 11/28/2020   11:22 AM 11/25/2020    3:27 PM 10/01/2020    3:22 PM 04/02/2020    3:37 PM 07/03/2019    2:22 PM 06/20/2019    3:29 PM  PHQ 2/9 Scores  PHQ - 2 Score 0 0 0 0 0 0 0    Fall Risk    06/04/2022    2:46 PM 05/07/2022    3:17 PM 04/01/2022    2:57 PM 01/06/2022    2:25 PM 12/08/2021   10:00 AM  Fall Risk   Falls in the past year? 0 1 0 0 0  Number falls in past yr: 0 0 0 0 0  Injury with Fall? 0 1 0 0 0  Risk for fall due to : No Fall Risks No Fall Risks No Fall Risks No Fall Risks No Fall Risks  Follow up Falls evaluation completed Falls evaluation completed Falls evaluation completed Falls evaluation completed Falls evaluation completed    FALL RISK PREVENTION PERTAINING TO THE HOME:  Any stairs in or around the home? Yes  If so, are there any without handrails? No  Home free of loose throw rugs in walkways, pet beds, electrical cords, etc? No  Adequate lighting in your home to reduce risk of falls? Yes   ASSISTIVE DEVICES UTILIZED TO PREVENT FALLS:  Life alert? Yes  Use of a cane, walker or w/c? Yes  Grab bars in the bathroom? Yes  Shower chair or bench in shower? Yes  Elevated toilet seat or a handicapped toilet? No   TIMED UP AND GO:  Was the test performed? No .  Length of time to ambulate 10 feet: N/A sec.   Gait steady and fast without use of assistive device  Cognitive Function:    05/23/2017    8:56 AM 02/04/2016    2:11 PM 11/27/2014    3:15 PM  MMSE - Mini Mental State Exam  Orientation to time 5 5 5    Orientation to Place 5 5 5   Registration 3 3 3   Attention/ Calculation 5 4 4   Recall 1 2 2   Language- name 2 objects 2 2 2   Language- repeat 1 1 1   Language- follow 3 step command 3 3 3   Language- read & follow direction 1 0 1  Write a sentence 1 0 1  Copy design 1 1 1   Total score 28 26 28         11/28/2020   11:24 AM 11/25/2020    3:31 PM 07/03/2019    2:26 PM 05/31/2018    9:41 AM  6CIT Screen  What Year? 0 points 0 points 0 points 0 points  What month? 3 points 3 points 0 points 0 points  What time? 0 points 0 points 0 points 0 points  Count back from 20 0 points 0 points 0 points 0 points  Months in reverse 4 points 4 points 0 points 0 points  Repeat phrase 10 points 10 points 2 points 4 points  Total Score 17 points 17 points 2 points 4 points    Immunizations Immunization History  Administered Date(s) Administered   Fluad Quad(high Dose 65+) 12/20/2018, 11/12/2019, 01/02/2021, 12/25/2021   Influenza,inj,Quad PF,6+ Mos 11/29/2012, 11/27/2013, 11/27/2014, 12/09/2015, 01/11/2017, 12/20/2017   PFIZER(Purple Top)SARS-COV-2 Vaccination 11/26/2019, 12/17/2019, 07/14/2020   Pneumococcal Conjugate-13 05/21/2014   Pneumococcal Polysaccharide-23 01/11/2005, 08/15/2012   Tdap 05/28/2014   Zoster Recombinat (Shingrix) 06/24/2017, 11/02/2017   Zoster, Live 01/14/2009    TDAP status: Up to date  Flu Vaccine status: Up to date  Pneumococcal vaccine status: Up to date  Covid-19 vaccine status: Information provided on how to obtain vaccines.   Qualifies for Shingles Vaccine? Yes   Zostavax completed Yes   Shingrix Completed?: Yes  Screening Tests Health Maintenance  Topic Date Due   COVID-19 Vaccine (4 - 2023-24 season) 10/30/2021   Medicare Annual Wellness (AWV)  11/28/2021   INFLUENZA VACCINE  09/30/2022   DTaP/Tdap/Td (2 - Td or Tdap) 05/27/2024   Pneumonia Vaccine 4365+ Years old  Completed   DEXA SCAN  Completed   Zoster Vaccines- Shingrix  Completed   HPV VACCINES   Aged Out    Health Maintenance  Health Maintenance Due  Topic Date Due   COVID-19 Vaccine (4 - 2023-24 season) 10/30/2021   Medicare Annual Wellness (AWV)  11/28/2021    Colorectal cancer screening: Type of screening: Colonoscopy. Completed 2017. Repeat every not recommended years  Mammogram status: No longer required due to age.  Bone Density status: Completed aged out. Results reflect: Bone density results: NORMAL. Repeat every N/A years. Aged out  Lung Cancer Screening: (Low Dose CT Chest recommended if Age 79-80 years, 30 pack-year currently smoking OR have quit w/in 15years.) does not qualify.   Lung Cancer Screening Referral: No  Additional Screening:  Hepatitis C Screening: does not qualify; Completed N/A  Vision Screening: Recommended annual ophthalmology exams for early detection of  glaucoma and other disorders of the eye. Is the patient up to date with their annual eye exam?  No  Who is the provider or what is the name of the office in which the patient attends annual eye exams? No If pt is not established with a provider, would they like to be referred to a provider to establish care? No .   Dental Screening: Recommended annual dental exams for proper oral hygiene  Community Resource Referral / Chronic Care Management: CRR required this visit?  No   CCM required this visit?  No      Plan:     I have personally reviewed and noted the following in the patient's chart:   Medical and social history Use of alcohol, tobacco or illicit drugs  Current medications and supplements including opioid prescriptions. Patient is not currently taking opioid prescriptions. Functional ability and status Nutritional status Physical activity Advanced directives List of other physicians Hospitalizations, surgeries, and ER visits in previous 12 months Vitals Screenings to include cognitive, depression, and falls Referrals and appointments  In addition, I have reviewed  and discussed with patient certain preventive protocols, quality metrics, and best practice recommendations. A written personalized care plan for preventive services as well as general preventive health recommendations were provided to patient.     Thana Ramp Medina-Vargas, NP   06/04/2022   Nurse Notes:  Needs to be done annually.

## 2022-06-04 NOTE — Patient Instructions (Signed)
  [name] , Thank you for taking time to come for your Medicare Wellness Visit. I appreciate your ongoing commitment to your health goals. Please review the following plan we discussed and let me know if I can assist you in the future.   These are the goals we discussed:  Goals      Exercise 3x per week (30 min per time)     - will exercise everyday X 15 minutes.     Increase physical activity     Would like to walk every day when it is warm     Increase water intake     Starting 02/04/16, I will attempt to increase my water from 3 bottles to 4-5 bottles per day.  Continues to want to work at this goal 5/21        This is a list of the screening recommended for you and due dates:  Health Maintenance  Topic Date Due   COVID-19 Vaccine (4 - 2023-24 season) 10/30/2021   Flu Shot  09/30/2022   Medicare Annual Wellness Visit  06/04/2023   DTaP/Tdap/Td vaccine (2 - Td or Tdap) 05/27/2024   Pneumonia Vaccine  Completed   DEXA scan (bone density measurement)  Completed   Zoster (Shingles) Vaccine  Completed   HPV Vaccine  Aged Out

## 2022-06-09 ENCOUNTER — Other Ambulatory Visit (HOSPITAL_COMMUNITY): Payer: Self-pay

## 2022-06-23 ENCOUNTER — Telehealth: Payer: Self-pay

## 2022-06-23 NOTE — Patient Outreach (Signed)
  Care Coordination   06/23/2022 Name: [name] MRN: [id number] DOB: 11-08-1931   Care Coordination Outreach Attempts:  An unsuccessful telephone outreach was attempted today to offer the patient information about available care coordination services as a benefit of their health plan.   Follow Up Plan:  Additional outreach attempts will be made to offer the patient care coordination information and services.   Encounter Outcome:  No Answer   Care Coordination Interventions:  No, not indicated    Bevelyn Ngo, BSW, CDP Social Worker, Certified Dementia Practitioner [facility] Care Management  Care Coordination [phone]

## 2022-06-25 ENCOUNTER — Telehealth: Payer: Self-pay

## 2022-06-25 NOTE — Patient Outreach (Signed)
  Care Coordination   06/25/2022 Name: [name] MRN: [id number] DOB: [birth year]   Care Coordination Outreach Attempts:  A second unsuccessful outreach was attempted today to offer the patient with information about available care coordination services as a benefit of their health plan.     Follow Up Plan:  Additional outreach attempts will be made to offer the patient care coordination information and services.   Encounter Outcome:  No Answer   Care Coordination Interventions:  No, not indicated    Bevelyn Ngo, BSW, CDP Social Worker, Certified Dementia Practitioner [facility] Care Management  Care Coordination [phone]

## 2022-06-29 ENCOUNTER — Telehealth: Payer: Self-pay

## 2022-06-29 NOTE — Patient Outreach (Signed)
  Care Coordination   06/29/2022 Name: [name] MRN: [id number] DOB: 09-May-1931   Care Coordination Outreach Attempts:  A third unsuccessful outreach was attempted today to offer the patient with information about available care coordination services as a benefit of their health plan.   Follow Up Plan:  No further outreach attempts will be made at this time. We have been unable to contact the patient to offer or enroll patient in care coordination services  Encounter Outcome:  No Answer   Care Coordination Interventions:  No, not indicated    Bevelyn Ngo, BSW, CDP Social Worker, Certified Dementia Practitioner [facility] Care Management  Care Coordination [phone]

## 2022-07-02 ENCOUNTER — Other Ambulatory Visit (HOSPITAL_COMMUNITY): Payer: Self-pay

## 2022-07-02 ENCOUNTER — Ambulatory Visit (INDEPENDENT_AMBULATORY_CARE_PROVIDER_SITE_OTHER): Payer: Medicare Other | Admitting: Nurse Practitioner

## 2022-07-02 ENCOUNTER — Encounter: Payer: Self-pay | Admitting: Nurse Practitioner

## 2022-07-02 VITALS — BP 136/76 | HR 56 | Temp 97.3°F | Resp 14 | Wt 153.6 lb

## 2022-07-02 DIAGNOSIS — D508 Other iron deficiency anemias: Secondary | ICD-10-CM

## 2022-07-02 DIAGNOSIS — G319 Degenerative disease of nervous system, unspecified: Secondary | ICD-10-CM

## 2022-07-02 DIAGNOSIS — E782 Mixed hyperlipidemia: Secondary | ICD-10-CM | POA: Diagnosis not present

## 2022-07-02 DIAGNOSIS — I517 Cardiomegaly: Secondary | ICD-10-CM

## 2022-07-02 DIAGNOSIS — I509 Heart failure, unspecified: Secondary | ICD-10-CM

## 2022-07-02 DIAGNOSIS — N1831 Chronic kidney disease, stage 3a: Secondary | ICD-10-CM | POA: Diagnosis not present

## 2022-07-02 DIAGNOSIS — R0609 Other forms of dyspnea: Secondary | ICD-10-CM | POA: Diagnosis not present

## 2022-07-02 DIAGNOSIS — I480 Paroxysmal atrial fibrillation: Secondary | ICD-10-CM

## 2022-07-02 DIAGNOSIS — E876 Hypokalemia: Secondary | ICD-10-CM | POA: Diagnosis not present

## 2022-07-02 DIAGNOSIS — Z8673 Personal history of transient ischemic attack (TIA), and cerebral infarction without residual deficits: Secondary | ICD-10-CM

## 2022-07-02 DIAGNOSIS — R634 Abnormal weight loss: Secondary | ICD-10-CM | POA: Diagnosis not present

## 2022-07-02 MED ORDER — FUROSEMIDE 20 MG PO TABS
20.0000 mg | ORAL_TABLET | Freq: Every day | ORAL | 3 refills | Status: DC
  Filled 2022-07-02: qty 30, 30d supply, fill #0
  Filled 2022-08-13: qty 30, 30d supply, fill #1
  Filled 2022-09-15: qty 30, 30d supply, fill #2
  Filled 2022-11-05: qty 30, 30d supply, fill #3
  Filled 2022-11-05: qty 4, 4d supply, fill #3
  Filled 2022-11-05: qty 26, 26d supply, fill #3

## 2022-07-02 MED ORDER — POTASSIUM CHLORIDE CRYS ER 10 MEQ PO TBCR
10.0000 meq | EXTENDED_RELEASE_TABLET | Freq: Every day | ORAL | 1 refills | Status: DC | PRN
  Filled 2022-07-02: qty 30, 30d supply, fill #0
  Filled 2022-09-15: qty 30, 30d supply, fill #1

## 2022-07-02 NOTE — Progress Notes (Unsigned)
Careteam: Patient Care Team: Sharon Seller, NP as PCP - General (Geriatric Medicine) Cherlyn Roberts, MD as Consulting Physician (Dermatology) Dorena Cookey, MD (Inactive) as Consulting Physician (Gastroenterology) Cyndia Bent, MD (Inactive) as Consulting Physician (General Surgery) Runell Gess, MD as Consulting Physician (Cardiology) Alfredo Martinez, MD as Consulting Physician (Urology) Drema Dallas, DO as Consulting Physician (Neurology)  PLACE OF SERVICE:  [facility]  Advanced Directive information    Allergies  Allergen Reactions   Lisinopril Cough   Ceftriaxone Rash   Codeine Rash    All over the body.    Chief Complaint  Patient presents with   Medical Management of Chronic Issues    Having issues with breathing and she has a cough still, scared that its still pneumonia.      HPI: Patient is a 89 y.o. female for routine follow up.   Continues to have issues with breathing. Was seen acutely for DOE in march, started on lasix 20 mg for 3 days then as needed for shortness of breath.  LE edema has improved.  Sleeps with a big and small pillow at night which is baseline More trouble with shortness of breath with minimal activity.  She was doing better when she was on lasix and then stopped and now feeling worse.   Not eating much. Son reports she eats some of a sausage and egg biscuits and half a chicken sandwich and then will snack through the day.   Son stays with her to help.  Ongoing memory issues.   Review of Systems:  Review of Systems  Constitutional:  Positive for weight loss. Negative for chills and fever.  HENT:  Negative for tinnitus.   Respiratory:  Positive for shortness of breath (with activity). Negative for cough and sputum production.   Cardiovascular:  Positive for leg swelling. Negative for chest pain and palpitations.  Gastrointestinal:  Negative for abdominal pain, constipation, diarrhea and heartburn.  Genitourinary:   Negative for dysuria, frequency and urgency.  Musculoskeletal:  Negative for back pain, falls, joint pain and myalgias.  Skin: Negative.   Neurological:  Negative for dizziness and headaches.  Psychiatric/Behavioral:  Negative for depression and memory loss. The patient does not have insomnia.     Past Medical History:  Diagnosis Date   Aortic stenosis 07/20/2012   Aortic stenosis    Arthritis    Cancer (HCC)    Breast   Carotid bruit 08/06/2008   Doppler - R ECA demonstrates noarrowing w/ elevated velocities consistent w/ >70% diameter reduction; R and L ICAs show no evidence of diameter reduction, significant tortuosity or vascular abnormality;    COPD (chronic obstructive pulmonary disease) (HCC)    Hearing loss    Heart murmur    Hyperlipidemia    Hypertension    dr Allyson Sabal   Osteoporosis    PAF (paroxysmal atrial fibrillation) (HCC) 09/04/2012   PVD (peripheral vascular disease) (HCC) 08/13/2010   R/P MV - normal pattern of perfusion in all regions, EF 76%; no significant wall abnormalities noted; normal perfusion study   Right carotid bruit    high-grade right external carotid artery stenosis by 2 parts ultrasound 2 years ago   S/P aortic valve replacement with bioprosthetic valve 08/30/2012   21 mm [facility] Ease bovine pericardial tissue valve   Stroke (cerebrum) [facility])    Past Surgical History:  Procedure Laterality Date   ABDOMINAL HYSTERECTOMY     Partial   AORTIC VALVE REPLACEMENT N/A 08/30/2012   Procedure:  AORTIC VALVE REPLACEMENT (AVR);  Surgeon: Purcell Nails, MD;  Location: [facility] OR;  Service: Open Heart Surgery;  Laterality: N/A;   BIOPSY SHOULDER Left 10/08/2005   shave biopsy   BREAST LUMPECTOMY  02/25/1997   right   CARDIAC CATHETERIZATION     CHOLECYSTECTOMY N/A 01/25/2022   Procedure: LAPAROSCOPIC CHOLECYSTECTOMY;  Surgeon: Axel Filler, MD;  Location: [facility] OR;  Service: General;  Laterality: N/A;   COLON SURGERY  1959   COSMETIC SURGERY Left 1997    Breast implant   EYE SURGERY  1997   Cataract surgery   INTRAOPERATIVE TRANSESOPHAGEAL ECHOCARDIOGRAM N/A 08/30/2012   Procedure: INTRAOPERATIVE TRANSESOPHAGEAL ECHOCARDIOGRAM;  Surgeon: Purcell Nails, MD;  Location: [facility] OR;  Service: Open Heart Surgery;  Laterality: N/A;   LEFT AND RIGHT HEART CATHETERIZATION WITH CORONARY ANGIOGRAM N/A 08/14/2012   Procedure: LEFT AND RIGHT HEART CATHETERIZATION WITH CORONARY ANGIOGRAM;  Surgeon: Runell Gess, MD;  Location: [facility] CATH LAB;  Service: Cardiovascular;  Laterality: N/A;   LEFT HEART CATH  08/14/12   Nl cors, AS   MASTECTOMY  09/29/95   left   PARTIAL HYSTERECTOMY     TOTAL HIP ARTHROPLASTY  09/2010   Social History:   reports that she has never smoked. She has never used smokeless tobacco. She reports that she does not drink alcohol and does not use drugs.  Family History  Problem Relation Age of Onset   Cancer Mother        Bladder   Early death Father        Tractor accident   Early death Brother        Tax inspector   Early death Brother        during heart surgery    Medications: Patient's Medications  New Prescriptions   No medications on file  Previous Medications   ACETAMINOPHEN (TYLENOL) 650 MG CR TABLET    Take 1 tablet (650 mg total) by mouth 2 (two) times daily.   ALBUTEROL (VENTOLIN HFA) 108 (90 BASE) MCG/ACT INHALER    Inhale 2 puffs into the lungs every 6 (six) hours as needed for wheezing or shortness of breath.   APIXABAN (ELIQUIS) 5 MG TABS TABLET    Take 1 tablet (5 mg total) by mouth 2 (two) times daily.   ASCORBIC ACID (VITAMIN C) 100 MG TABLET    Take 100 mg by mouth daily.   ATORVASTATIN (LIPITOR) 40 MG TABLET    Take 1 tablet (40 mg total) by mouth daily.   CALCIUM CITRATE-VITAMIN D (CITRACAL + D PO)    Take 1 tablet by mouth daily.   DICLOFENAC SODIUM (VOLTAREN) 1 % GEL    Apply 4 g topically 4 (four) times daily as needed.   FUROSEMIDE (LASIX) 20 MG TABLET    Take 1 tablet by mouth once daily x 3 days,   then change to 1 tablet daily as needed for shortness of breath  or wheezing or abrupt weight gain > 3 lbs. take with potassium.   MAGNESIUM HYDROXIDE (PHILLIPS MILK OF MAGNESIA PO)    Take 15 mLs by mouth at bedtime. Take daily at 6 o'clock at night   METOPROLOL TARTRATE (LOPRESSOR) 25 MG TABLET    Take 1 tablet (25 mg total) by mouth 2 (two) times daily.   MIRABEGRON ER (MYRBETRIQ) 50 MG TB24 TABLET    Take 1 tablet (50 mg total) by mouth daily.   PANTOPRAZOLE (PROTONIX) 20 MG TABLET    Take 1 tablet (20  mg total) by mouth daily.   POTASSIUM CHLORIDE SA (KLOR-CON M) 20 MEQ TABLET    Take 1 tablet (20 mEq total) by mouth daily as needed along with Furosemide.   VALSARTAN (DIOVAN) 160 MG TABLET    Take 1 tablet (160 mg total) by mouth in the morning.   VALSARTAN (DIOVAN) 80 MG TABLET    Take 1 tablet (80 mg total) by mouth every evening.  Modified Medications   No medications on file  Discontinued Medications   No medications on file    Physical Exam:  Vitals:   07/02/22 1413  BP: 136/76  Pulse: (!) 56  Resp: 14  Temp: (!) 97.3 F (36.3 C)  TempSrc: Temporal  SpO2: 97%  Weight: 153 lb 9.6 oz (69.7 kg)   Body mass index is 27.21 kg/m. Wt Readings from Last 3 Encounters:  07/02/22 153 lb 9.6 oz (69.7 kg)  06/04/22 155 lb 2 oz (70.4 kg)  05/07/22 160 lb 3.2 oz (72.7 kg)    Physical Exam Constitutional:      General: She is not in acute distress.    Appearance: She is well-developed. She is not diaphoretic.  HENT:     Head: Normocephalic and atraumatic.     Mouth/Throat:     Pharynx: No oropharyngeal exudate.  Eyes:     Conjunctiva/sclera: Conjunctivae normal.     Pupils: Pupils are equal, round, and reactive to light.  Cardiovascular:     Rate and Rhythm: Normal rate and regular rhythm.     Heart sounds: Normal heart sounds.  Pulmonary:     Effort: Pulmonary effort is normal.     Breath sounds: Normal breath sounds.  Abdominal:     General: Bowel sounds are normal.      Palpations: Abdomen is soft.  Musculoskeletal:     Cervical back: Normal range of motion and neck supple.     Right lower leg: Edema present.     Left lower leg: Edema present.  Skin:    General: Skin is warm and dry.  Neurological:     Mental Status: She is alert.  Psychiatric:        Mood and Affect: Mood normal.     Labs reviewed: Basic Metabolic Panel: Recent Labs    07/03/21 1413 12/25/21 1437 01/24/22 0438 01/25/22 0415 03/22/22 2132 04/01/22 1531 05/07/22 1553  NA 141   < > 144   < > 139 141 144  K 4.0   < > 4.1   < > 3.1* 4.7 3.5  CL 104   < > 105   < > 103 107 106  CO2 24   < > 25   < > 23 27 28   GLUCOSE 87   < > 114*   < > 110* 90 155*  BUN 14   < > 16   < > 10 11 10   CREATININE 1.12*   < > 1.26*   < > 1.11* 1.11* 0.92  CALCIUM 10.1   < > 9.5   < > 9.4 9.7 9.4  MG  --   --  1.9  --   --   --   --   TSH 0.85  --   --   --   --   --   --    < > = values in this interval not displayed.   Liver Function Tests: Recent Labs    01/26/22 0725 01/27/22 0406 03/22/22 2132  AST 194* 116* 61*  ALT 247* 200* 29  ALKPHOS 133* 120 80  BILITOT 1.0 0.9 0.9  PROT 5.6* 6.2* 5.9*  ALBUMIN 3.3* 3.6 3.7   Recent Labs    01/24/22 0438 03/22/22 2132  LIPASE 117* 32   No results for input(s): "AMMONIA" in the last 8760 hours. CBC: Recent Labs    01/24/22 0438 01/25/22 0415 01/28/22 0534 03/22/22 2132 04/01/22 1531  WBC 9.2   < > 11.6* 7.5 6.6  NEUTROABS 6.6  --  9.0*  --  4,640  HGB 12.2   < > 11.3* 11.6* 11.6*  HCT 36.4   < > 33.6* 34.6* 33.7*  MCV 99.7   < > 98.2 101.2* 98.8  PLT 145*   < > 177 158 162   < > = values in this interval not displayed.   Lipid Panel: Recent Labs    07/03/21 1413  CHOL 140  HDL 60  LDLCALC 65  TRIG 72  CHOLHDL 2.3   TSH: Recent Labs    07/03/21 1413  TSH 0.85   A1C: Lab Results  Component Value Date   HGBA1C 5.3 06/24/2020     Assessment/Plan 1. DOE (dyspnea on exertion) - had improved but stopped  lasix will restart at this time, suspected r/t to CHF recently with elevated BNP.  - furosemide (LASIX) 20 MG tablet; Take 1 tablet by mouth once daily  Dispense: 30 tablet; Refill: 3  2. Hypokalemia - potassium chloride (KLOR-CON M) 10 MEQ tablet; Take 1 tablet (10 mEq total) by mouth daily as needed.  Dispense: 30 tablet; Refill: 1  3. Cerebral atrophy (HCC) -noted on imaging, ongoing memory issues, she has support of family.  4. Chronic kidney disease, stage 3a (HCC) -Chronic and stable Encourage proper hydration Follow metabolic panel Avoid nephrotoxic meds (NSAIDS)  5. Congestive heart failure, unspecified HF chronicity, unspecified heart failure type (HCC) -will restart lasix at this time, recommended to make appt for follow up with cardiology for follow up, she is due  Monitor weight daily -continues on metoprolol 25 mg BID with valsartan.  - Complete Metabolic Panel with eGFR - CBC with Differential/Platelet  6. Cardiomegaly Continues on metoprolol, will start lasix 20 mg daily   7. PAF (paroxysmal atrial fibrillation) (HCC) Rate controlled on metoprolol   8. History of CVA (cerebrovascular accident) -continues on eliquis   9. Iron deficiency anemia secondary to inadequate dietary iron intake - CBC with Differential/Platelet  10. Mixed hyperlipidemia -coninues on lipitor.  - Lipid panel  11. Weight loss -encouraged to liberalize diet. To have protein supplement in addition to smallest meal of the day. Return in about 3 months (around 10/02/2022) for routine follow up .  Janene Harvey. Biagio Borg [facility] [phone]

## 2022-07-02 NOTE — Patient Instructions (Addendum)
To call Runell Gess, MD and make follow up appt  Address: [address] [place], [place] [address] Phone: [phone]  Restart LASIX 20 mg by mouth daily Start potassium 10 meq daily

## 2022-07-07 LAB — TEST AUTHORIZATION 2

## 2022-07-07 LAB — CBC WITH DIFFERENTIAL/PLATELET
Absolute Monocytes: 575 cells/uL (ref 200–950)
Basophils Absolute: 50 cells/uL (ref 0–200)
Basophils Relative: 0.7 %
Eosinophils Absolute: 149 cells/uL (ref 15–500)
Eosinophils Relative: 2.1 %
HCT: 34.3 % — ABNORMAL LOW (ref 35.0–45.0)
Hemoglobin: 11.7 g/dL (ref 11.7–15.5)
Lymphs Abs: 1463 cells/uL (ref 850–3900)
MCH: 33.5 pg — ABNORMAL HIGH (ref 27.0–33.0)
MCHC: 34.1 g/dL (ref 32.0–36.0)
MCV: 98.3 fL (ref 80.0–100.0)
MPV: 10.2 fL (ref 7.5–12.5)
Monocytes Relative: 8.1 %
Neutro Abs: 4864 cells/uL (ref 1500–7800)
Neutrophils Relative %: 68.5 %
Platelets: 138 10*3/uL — ABNORMAL LOW (ref 140–400)
RBC: 3.49 10*6/uL — ABNORMAL LOW (ref 3.80–5.10)
RDW: 12.9 % (ref 11.0–15.0)
Total Lymphocyte: 20.6 %
WBC: 7.1 10*3/uL (ref 3.8–10.8)

## 2022-07-07 LAB — COMPLETE METABOLIC PANEL WITH GFR
AG Ratio: 2.2 (calc) (ref 1.0–2.5)
ALT: 9 U/L (ref 6–29)
AST: 17 U/L (ref 10–35)
Albumin: 4 g/dL (ref 3.6–5.1)
Alkaline phosphatase (APISO): 59 U/L (ref 37–153)
BUN/Creatinine Ratio: 13 (calc) (ref 6–22)
BUN: 13 mg/dL (ref 7–25)
CO2: 29 mmol/L (ref 20–32)
Calcium: 9.6 mg/dL (ref 8.6–10.4)
Chloride: 107 mmol/L (ref 98–110)
Creat: 1.01 mg/dL — ABNORMAL HIGH (ref 0.60–0.95)
Globulin: 1.8 g/dL (calc) — ABNORMAL LOW (ref 1.9–3.7)
Glucose, Bld: 94 mg/dL (ref 65–99)
Potassium: 4.5 mmol/L (ref 3.5–5.3)
Sodium: 142 mmol/L (ref 135–146)
Total Bilirubin: 1 mg/dL (ref 0.2–1.2)
Total Protein: 5.8 g/dL — ABNORMAL LOW (ref 6.1–8.1)
eGFR: 53 mL/min/{1.73_m2} — ABNORMAL LOW (ref >=60)

## 2022-07-07 LAB — IRON,TIBC AND FERRITIN PANEL
%SAT: 35 % (calc) (ref 16–45)
Ferritin: 124 ng/mL (ref 16–288)
Iron: 92 ug/dL (ref 45–160)
TIBC: 265 mcg/dL (calc) (ref 250–450)

## 2022-07-07 LAB — LIPID PANEL
Cholesterol: 113 mg/dL (ref <200)
HDL: 57 mg/dL (ref >=50)
LDL Cholesterol (Calc): 41 mg/dL (calc)
Non-HDL Cholesterol (Calc): 56 mg/dL (calc) (ref <130)
Total CHOL/HDL Ratio: 2 (calc) (ref <5.0)
Triglycerides: 72 mg/dL (ref <150)

## 2022-07-07 LAB — BRAIN NATRIURETIC PEPTIDE: Brain Natriuretic Peptide: 112 pg/mL — ABNORMAL HIGH (ref <100)

## 2022-07-19 ENCOUNTER — Other Ambulatory Visit (HOSPITAL_COMMUNITY): Payer: Self-pay

## 2022-07-22 ENCOUNTER — Ambulatory Visit (INDEPENDENT_AMBULATORY_CARE_PROVIDER_SITE_OTHER): Payer: Medicare Other | Admitting: Family

## 2022-07-22 ENCOUNTER — Encounter: Payer: Self-pay | Admitting: Family

## 2022-07-22 ENCOUNTER — Other Ambulatory Visit (HOSPITAL_COMMUNITY): Payer: Self-pay

## 2022-07-22 VITALS — BP 136/78 | HR 56 | Temp 97.4°F | Ht 63.0 in | Wt 143.6 lb

## 2022-07-22 DIAGNOSIS — R630 Anorexia: Secondary | ICD-10-CM

## 2022-07-22 DIAGNOSIS — F320 Major depressive disorder, single episode, mild: Secondary | ICD-10-CM

## 2022-07-22 MED ORDER — MIRTAZAPINE 7.5 MG PO TABS
7.5000 mg | ORAL_TABLET | Freq: Every day | ORAL | 3 refills | Status: DC
  Filled 2022-07-22: qty 30, 30d supply, fill #0

## 2022-07-22 NOTE — Progress Notes (Signed)
Provider: Bransen Fassnacht FNP-C  Sharon Seller, NP  Patient Care Team: Sharon Seller, NP as PCP - General (Geriatric Medicine) Cherlyn Roberts, MD as Consulting Physician (Dermatology) Dorena Cookey, MD (Inactive) as Consulting Physician (Gastroenterology) Cyndia Bent, MD (Inactive) as Consulting Physician (General Surgery) Runell Gess, MD as Consulting Physician (Cardiology) Alfredo Martinez, MD as Consulting Physician (Urology) Drema Dallas, DO as Consulting Physician (Neurology)  Extended Emergency Contact Information Primary Emergency Contact: Angelina Sheriff [place] Home Phone: [phone] Mobile Phone: [phone] Relation: Daughter Secondary Emergency Contact: Jaziyah, Dekam Mobile Phone: [phone] Relation: Son  Code Status:  Full Code  Goals of care: Advanced Directive information    05/07/2022    3:18 PM  Advanced Directives  Does Patient Have a Medical Advance Directive? No     Chief Complaint  Patient presents with   Acute Visit    Patient presents today for weakness and son think it comes from her not eating much. He reports patient may eat one small meal a day.    HPI:  Pt is a 89 y.o. female seen today for an acute visit for evaluation of generalized weakness.Here with son who states patient does not eat her meals " may eat one small meal per day.she does not get up until 1 or 2 pm.  Has had weight change since last visit but son states on fluid pill which could be contributing to weight loss.  No recent URI's or signs of UTI. States bowels had not moved for the past few days but moved today and just moved prior to provider coming to exam room. She denies any nausea,vomiting,bloating or diarrhea.  HPI limited patient and son poor historian.    Past Medical History:  Diagnosis Date   Aortic stenosis 07/20/2012   Aortic stenosis    Arthritis    Cancer (HCC)    Breast   Carotid bruit 08/06/2008   Doppler - R  ECA demonstrates noarrowing w/ elevated velocities consistent w/ >70% diameter reduction; R and L ICAs show no evidence of diameter reduction, significant tortuosity or vascular abnormality;    COPD (chronic obstructive pulmonary disease) (HCC)    Hearing loss    Heart murmur    Hyperlipidemia    Hypertension    dr Allyson Sabal   Osteoporosis    PAF (paroxysmal atrial fibrillation) (HCC) 09/04/2012   PVD (peripheral vascular disease) (HCC) 08/13/2010   R/P MV - normal pattern of perfusion in all regions, EF 76%; no significant wall abnormalities noted; normal perfusion study   Right carotid bruit    high-grade right external carotid artery stenosis by 2 parts ultrasound 2 years ago   S/P aortic valve replacement with bioprosthetic valve 08/30/2012   21 mm [facility] Ease bovine pericardial tissue valve   Stroke (cerebrum) [facility])    Past Surgical History:  Procedure Laterality Date   ABDOMINAL HYSTERECTOMY     Partial   AORTIC VALVE REPLACEMENT N/A 08/30/2012   Procedure: AORTIC VALVE REPLACEMENT (AVR);  Surgeon: Purcell Nails, MD;  Location: [facility] OR;  Service: Open Heart Surgery;  Laterality: N/A;   BIOPSY SHOULDER Left 10/08/2005   shave biopsy   BREAST LUMPECTOMY  02/25/1997   right   CARDIAC CATHETERIZATION     CHOLECYSTECTOMY N/A 01/25/2022   Procedure: LAPAROSCOPIC CHOLECYSTECTOMY;  Surgeon: Axel Filler, MD;  Location: [facility] OR;  Service: General;  Laterality: N/A;   COLON SURGERY  1959   COSMETIC SURGERY Left 1997   Breast implant  EYE SURGERY  1997   Cataract surgery   INTRAOPERATIVE TRANSESOPHAGEAL ECHOCARDIOGRAM N/A 08/30/2012   Procedure: INTRAOPERATIVE TRANSESOPHAGEAL ECHOCARDIOGRAM;  Surgeon: Purcell Nails, MD;  Location: [facility] OR;  Service: Open Heart Surgery;  Laterality: N/A;   LEFT AND RIGHT HEART CATHETERIZATION WITH CORONARY ANGIOGRAM N/A 08/14/2012   Procedure: LEFT AND RIGHT HEART CATHETERIZATION WITH CORONARY ANGIOGRAM;  Surgeon: Runell Gess, MD;  Location: [facility]  CATH LAB;  Service: Cardiovascular;  Laterality: N/A;   LEFT HEART CATH  08/14/12   Nl cors, AS   MASTECTOMY  09/29/95   left   PARTIAL HYSTERECTOMY     TOTAL HIP ARTHROPLASTY  09/2010    Allergies  Allergen Reactions   Lisinopril Cough   Ceftriaxone Rash   Codeine Rash    All over the body.    Outpatient Encounter Medications as of 07/22/2022  Medication Sig   acetaminophen (TYLENOL) 650 MG CR tablet Take 1 tablet (650 mg total) by mouth 2 (two) times daily.   albuterol (VENTOLIN HFA) 108 (90 Base) MCG/ACT inhaler Inhale 2 puffs into the lungs every 6 (six) hours as needed for wheezing or shortness of breath.   apixaban (ELIQUIS) 5 MG TABS tablet Take 1 tablet (5 mg total) by mouth 2 (two) times daily.   Ascorbic Acid (VITAMIN C) 100 MG tablet Take 100 mg by mouth daily.   atorvastatin (LIPITOR) 40 MG tablet Take 1 tablet (40 mg total) by mouth daily.   Calcium Citrate-Vitamin D (CITRACAL + D PO) Take 1 tablet by mouth daily.   diclofenac Sodium (VOLTAREN) 1 % GEL Apply 4 g topically 4 (four) times daily as needed.   furosemide (LASIX) 20 MG tablet Take 1 tablet (20 mg total) by mouth daily.   Magnesium Hydroxide (PHILLIPS MILK OF MAGNESIA PO) Take 15 mLs by mouth at bedtime. Take daily at 6 o'clock at night   metoprolol tartrate (LOPRESSOR) 25 MG tablet Take 1 tablet (25 mg total) by mouth 2 (two) times daily.   mirabegron ER (MYRBETRIQ) 50 MG TB24 tablet Take 1 tablet (50 mg total) by mouth daily.   pantoprazole (PROTONIX) 20 MG tablet Take 1 tablet (20 mg total) by mouth daily.   potassium chloride (KLOR-CON M) 10 MEQ tablet Take 1 tablet (10 mEq total) by mouth daily as needed.   valsartan (DIOVAN) 160 MG tablet Take 1 tablet (160 mg total) by mouth in the morning.   valsartan (DIOVAN) 80 MG tablet Take 1 tablet (80 mg total) by mouth every evening.   No facility-administered encounter medications on file as of 07/22/2022.    Review of Systems  Constitutional:  Negative for  appetite change, chills, fatigue, fever and unexpected weight change.  HENT:  Negative for congestion, dental problem, ear discharge, ear pain, facial swelling, hearing loss, nosebleeds, postnasal drip, rhinorrhea, sinus pressure, sinus pain, sneezing, sore throat, tinnitus and trouble swallowing.   Eyes:  Negative for pain, discharge, redness, itching and visual disturbance.  Respiratory:  Negative for cough, chest tightness, shortness of breath and wheezing.   Cardiovascular:  Negative for chest pain, palpitations and leg swelling.  Gastrointestinal:  Negative for abdominal distention, abdominal pain, blood in stool, diarrhea, nausea and vomiting.       Had no BM several days but moved today as above.   Endocrine: Negative for cold intolerance, heat intolerance, polydipsia, polyphagia and polyuria.  Genitourinary:  Negative for difficulty urinating, dysuria, flank pain, frequency and urgency.  Musculoskeletal:  Negative for arthralgias, back pain, gait problem,  joint swelling, myalgias, neck pain and neck stiffness.  Skin:  Negative for color change, pallor, rash and wound.  Neurological:  Negative for dizziness, syncope, speech difficulty, weakness, light-headedness, numbness and headaches.  Hematological:  Does not bruise/bleed easily.  Psychiatric/Behavioral:  Negative for agitation, behavioral problems, confusion, hallucinations, self-injury, sleep disturbance and suicidal ideas. The patient is not nervous/anxious.     Immunization History  Administered Date(s) Administered   Fluad Quad(high Dose 65+) 12/20/2018, 11/12/2019, 01/02/2021, 12/25/2021   Influenza,inj,Quad PF,6+ Mos 11/29/2012, 11/27/2013, 11/27/2014, 12/09/2015, 01/11/2017, 12/20/2017   PFIZER(Purple Top)SARS-COV-2 Vaccination 11/26/2019, 12/17/2019, 07/14/2020   Pneumococcal Conjugate-13 05/21/2014   Pneumococcal Polysaccharide-23 01/11/2005, 08/15/2012   Tdap 05/28/2014   Zoster Recombinat (Shingrix) 06/24/2017,  11/02/2017   Zoster, Live 01/14/2009   Pertinent  Health Maintenance Due  Topic Date Due   INFLUENZA VACCINE  09/30/2022   DEXA SCAN  Completed      01/27/2022    7:45 PM 01/28/2022   12:00 PM 04/01/2022    2:57 PM 05/07/2022    3:17 PM 06/04/2022    2:46 PM  Fall Risk  Falls in the past year?   0 1 0  Was there an injury with Fall?   0 1 0  Fall Risk Category Calculator   0 2 0  (RETIRED) Patient Fall Risk Level High fall risk High fall risk     Patient at Risk for Falls Due to   No Fall Risks No Fall Risks No Fall Risks  Fall risk Follow up   Falls evaluation completed Falls evaluation completed Falls evaluation completed   Functional Status Survey:    Vitals:   07/22/22 1453  BP: 136/78  Pulse: (!) 56  Temp: (!) 97.4 F (36.3 C)  SpO2: 94%  Weight: 143 lb 9.6 oz (65.1 kg)  Height: 5\' 3"  (1.6 m)   Body mass index is 25.44 kg/m. Physical Exam Vitals reviewed.  Constitutional:      General: She is not in acute distress.    Appearance: Normal appearance. She is normal weight. She is not ill-appearing or diaphoretic.  HENT:     Head: Normocephalic.     Right Ear: Tympanic membrane, ear canal and external ear normal. There is no impacted cerumen.     Left Ear: Tympanic membrane, ear canal and external ear normal. There is no impacted cerumen.     Nose: Nose normal. No congestion or rhinorrhea.     Mouth/Throat:     Mouth: Mucous membranes are moist.     Pharynx: Oropharynx is clear. No oropharyngeal exudate or posterior oropharyngeal erythema.  Eyes:     General: No scleral icterus.       Right eye: No discharge.        Left eye: No discharge.     Extraocular Movements: Extraocular movements intact.     Conjunctiva/sclera: Conjunctivae normal.     Pupils: Pupils are equal, round, and reactive to light.  Neck:     Vascular: No carotid bruit.  Cardiovascular:     Rate and Rhythm: Normal rate and regular rhythm.     Pulses: Normal pulses.     Heart sounds: Normal  heart sounds. No murmur heard.    No friction rub. No gallop.  Pulmonary:     Effort: Pulmonary effort is normal. No respiratory distress.     Breath sounds: Normal breath sounds. No wheezing, rhonchi or rales.  Chest:     Chest wall: No tenderness.  Abdominal:     General:  Bowel sounds are normal. There is no distension.     Palpations: Abdomen is soft. There is no mass.     Tenderness: There is no abdominal tenderness. There is no right CVA tenderness, left CVA tenderness, guarding or rebound.  Musculoskeletal:        General: No swelling or tenderness. Normal range of motion.     Cervical back: Normal range of motion. No rigidity or tenderness.     Right lower leg: No edema.     Left lower leg: No edema.  Lymphadenopathy:     Cervical: No cervical adenopathy.  Skin:    General: Skin is warm and dry.     Coloration: Skin is not pale.     Findings: No bruising, erythema, lesion or rash.  Neurological:     Mental Status: She is alert and oriented to person, place, and time.     Cranial Nerves: No cranial nerve deficit.     Sensory: No sensory deficit.     Motor: No weakness.     Coordination: Coordination normal.     Gait: Gait abnormal.  Psychiatric:        Mood and Affect: Mood normal.        Speech: Speech normal.        Behavior: Behavior normal.        Thought Content: Thought content normal.        Cognition and Memory: Memory is impaired. She exhibits impaired recent memory.        Judgment: Judgment normal.     Labs reviewed: Recent Labs    01/24/22 0438 01/25/22 0415 04/01/22 1531 05/07/22 1553 07/02/22 1450  NA 144   < > 141 144 142  K 4.1   < > 4.7 3.5 4.5  CL 105   < > 107 106 107  CO2 25   < > 27 28 29   GLUCOSE 114*   < > 90 155* 94  BUN 16   < > 11 10 13   CREATININE 1.26*   < > 1.11* 0.92 1.01*  CALCIUM 9.5   < > 9.7 9.4 9.6  MG 1.9  --   --   --   --    < > = values in this interval not displayed.   Recent Labs    01/26/22 0725 01/27/22 0406  03/22/22 2132 07/02/22 1450  AST 194* 116* 61* 17  ALT 247* 200* 29 9  ALKPHOS 133* 120 80  --   BILITOT 1.0 0.9 0.9 1.0  PROT 5.6* 6.2* 5.9* 5.8*  ALBUMIN 3.3* 3.6 3.7  --    Recent Labs    01/28/22 0534 03/22/22 2132 04/01/22 1531 07/02/22 1450  WBC 11.6* 7.5 6.6 7.1  NEUTROABS 9.0*  --  4,640 4,864  HGB 11.3* 11.6* 11.6* 11.7  HCT 33.6* 34.6* 33.7* 34.3*  MCV 98.2 101.2* 98.8 98.3  PLT 177 158 162 138*   Lab Results  Component Value Date   TSH 0.85 07/03/2021   Lab Results  Component Value Date   HGBA1C 5.3 06/24/2020   Lab Results  Component Value Date   CHOL 113 07/02/2022   HDL 57 07/02/2022   LDLCALC 41 07/02/2022   TRIG 72 07/02/2022   CHOLHDL 2.0 07/02/2022    Significant Diagnostic Results in last 30 days:  No results found.  Assessment/Plan 1. Decreased appetite Start on mirtazapine then follow up in one month to reevaluate. - mirtazapine (REMERON) 7.5 MG tablet; Take 1 tablet (7.5 mg  total) by mouth at bedtime.  Dispense: 30 tablet; Refill: 3  2. Current mild episode of major depressive disorder without prior episode [facility]) Son reports depressive symptoms.sleeps too much and not wanting to eat. - mirtazapine (REMERON) 7.5 MG tablet; Take 1 tablet (7.5 mg total) by mouth at bedtime.  Dispense: 30 tablet; Refill: 3  Family/ staff Communication: Reviewed plan of care with patient and son verbalized understanding  Labs/tests ordered: None   Next Appointment: Return if symptoms worsen or fail to improve.   Caesar Bookman, NP

## 2022-08-06 ENCOUNTER — Other Ambulatory Visit (HOSPITAL_COMMUNITY): Payer: Self-pay

## 2022-08-13 ENCOUNTER — Other Ambulatory Visit (HOSPITAL_COMMUNITY): Payer: Self-pay

## 2022-08-19 ENCOUNTER — Ambulatory Visit: Payer: Medicare Other | Attending: Student | Admitting: Student

## 2022-08-19 ENCOUNTER — Encounter: Payer: Self-pay | Admitting: Student

## 2022-08-19 VITALS — BP 114/76 | HR 54 | Ht 63.0 in | Wt 151.2 lb

## 2022-08-19 DIAGNOSIS — Z952 Presence of prosthetic heart valve: Secondary | ICD-10-CM | POA: Diagnosis not present

## 2022-08-19 DIAGNOSIS — Z79899 Other long term (current) drug therapy: Secondary | ICD-10-CM | POA: Diagnosis not present

## 2022-08-19 DIAGNOSIS — R410 Disorientation, unspecified: Secondary | ICD-10-CM | POA: Diagnosis not present

## 2022-08-19 DIAGNOSIS — R0609 Other forms of dyspnea: Secondary | ICD-10-CM | POA: Insufficient documentation

## 2022-08-19 DIAGNOSIS — R600 Localized edema: Secondary | ICD-10-CM | POA: Diagnosis not present

## 2022-08-19 DIAGNOSIS — I4821 Permanent atrial fibrillation: Secondary | ICD-10-CM | POA: Diagnosis not present

## 2022-08-19 DIAGNOSIS — I350 Nonrheumatic aortic (valve) stenosis: Secondary | ICD-10-CM | POA: Diagnosis not present

## 2022-08-19 NOTE — Progress Notes (Addendum)
Cardiology Clinic Note   Date: 08/19/2022 ID: [name], [name] [birth year], MRN [id number]  Primary Cardiologist:  Nanetta Batty, MD  Patient Profile    [name] is a 89 y.o. female who presents to the clinic today for routine follow-up.    Past medical history significant for: Permanent A-fib.Marland Kitchen PAD. Carotid artery disease. Carotid ultrasound 07/24/2021: 1 to 39% bilateral ICA.  Left subclavian artery stenotic with disturbed blood flow. Aortic stenosis. R/LHC 08/14/2012 normal coronary arteries.  Critical aortic stenosis.  CTS consult. AVR 08/30/2012: Edwards magna pericardial tissue valve 21 mm. 07/31/2021: EF 60 to 65%.  Mild LVH.  Normal RV function.  Moderate LAE.  Mild RAE.  Moderate TR.  21 mm Edwards bioprosthetic valve present in the aortic position with normal structure and function. Hypertension. Hyperlipidemia. CVA. GERD.     History of Present Illness    [name] is a longtime patient of cardiology.  She is followed by Dr. Allyson Sabal for the above outlined history.  She was last seen in the office by Dr. Allyson Sabal on 07/15/2021 for routine follow-up.  She was doing well at that time and no changes were made.  Patient was evaluated in the ED on 03/22/2022 with complaints of substernal chest pain and right upper quadrant abdominal pain.  Patient was responsive to NTG by EMS.  She describes pain in epigastric region that radiates to back.  Patient was found to have pleural effusion of the right lung (this has been seen in prior x-rays).  CT was suggestive of cirrhosis and also showed pulmonary nodules that we will need to be rechecked in 12 months.  She was hypokalemic and potassium was repleted.  She was also prescribed Protonix.  Troponin negative x 2.  Today, patient is accompanied by son.  She is slow to respond and difficult to understand.  She appears somewhat confused.  Patient's son reports this is her baseline.  Son is a poor historian.  He does not live with  patient but stays with her often.  Patient was started on Lasix by PCP in March after chest x-ray showed some pulmonary congestion and possible PNA.  Patient has been taking Lasix daily.  At some point she developed lower extremity edema that is greatly improved since starting Lasix.  It is unclear if she is short of breath.  According to patient's son he does not feel that she experiences dyspnea with routine activities.  He reports that she might get a little short of breath when walking down to the mailbox and back but she does not do that frequently.  Patient clearly denies orthopnea or PND.  Patient is mostly sedentary.  She typically sleeps until the afternoon and then will go to bed around midnight.  Son reports patient does not eat much but diet typically consists of a sausage biscuit in the afternoon when she wakes and a fried chicken sandwich for dinner.  She typically does not finish either of the sandwiches.  She does not have a scale at home to weigh.  Patient's son expresses some concern regarding patient's heart rate dropping into the 40s at night.  She does not complain of dizziness or lightheadedness.  Wt Readings from Last 3 Encounters:  08/19/22 151 lb 3.2 oz (68.6 kg)  07/22/22 143 lb 9.6 oz (65.1 kg)  07/02/22 153 lb 9.6 oz (69.7 kg)     ROS: All other systems reviewed and are otherwise negative except as noted in History of Present Illness.  Studies Reviewed       ECG is not ordered today.  Risk Assessment/Calculations     CHA2DS2-VASc Score = 7   This indicates a 11.2% annual risk of stroke. The patient's score is based upon: CHF History: 0 HTN History: 1 Diabetes History: 0 Stroke History: 2 Vascular Disease History: 1 Age Score: 2 Gender Score: 1             Physical Exam    VS:  BP 114/76 (BP Location: Left Arm, Patient Position: Sitting, Cuff Size: Normal)   Pulse (!) 54   Ht 5\' 3"  (1.6 m)   Wt 151 lb 3.2 oz (68.6 kg)   SpO2 96%   BMI 26.78 kg/m  ,  BMI Body mass index is 26.78 kg/m.  GEN: Well nourished, well developed, in no acute distress. Neck: No JVD or carotid bruits. Cardiac: Irregular rhythm, controlled rate. No murmurs. No rubs or gallops.   Respiratory:  Respirations regular and unlabored. Clear to auscultation without rales, wheezing or rhonchi. GI: Soft, nontender, nondistended. Extremities: Radials/DP/PT 2+ and equal bilaterally. No clubbing or cyanosis. No edema   Skin: Warm and dry, no rash. Neuro: Strength intact.  Assessment & Plan   Permanent A-fib.  Patient has no cardiac awareness of arrhythmia.  Irregular rhythm with controlled rate today.  Heart rate 54 bpm.  Denies spontaneous bleeding concerns.  Patient's son is concerned that her heart rate drops into the 40s at night.  Will decrease p.m. dose of metoprolol to 12.5 Mg.  Continue Eliquis. Appropriate Eliquis dose. Lower extremity edema.  Patient was started on Lasix by PCP March 2023.  Patient's son reports at some point (timeline uncertain) patient developed lower extremity edema.  He reports it is greatly improved from what it used to be.  Patient admits to sitting in a dependent position much of the day.  Her diet consists of high sodium items such as a sausage biscuit or fried chicken sandwich.  She does not eat much.  Discussed decreasing sodium in diet and son inquires what he can give her.  He is concerned if he does not provide her these items that she will not eat at all.  Encouraged attempting to cook at home so sodium could be more controlled.  Mild to moderate nonpitting lower extremity edema bilaterally.  Continue Lasix.  Will get BMP today. Aortic stenosis.  S/p AVR July 2014.  Echo June 2023 showed normal function of prosthesis.  Will get repeat echo.  (See #4). DOE.  It is unclear if patient is experiencing increased dyspnea with exertion.  Lungs are clear to auscultation today.  Will get echo for further evaluation. Confusion.  Patient is slow to  respond and appears confused today.  Patient's son reports this is baseline.  Will reach out to PCP to see if further evaluation is required.  Disposition: Echo.  BMP today.  Metoprolol 25 mg in the a.m. and 12.5 mg in the p.m.  Obtain scale and weigh daily.  Weight log provided.  Salty 6 information sheet provided.  Return in 8 to 12 weeks or sooner as needed.         Signed, Etta Grandchild. Cacie Gaskins, DNP, NP-C

## 2022-08-19 NOTE — Patient Instructions (Addendum)
Medication Instructions:  Metoprolol Tartrate take 25 mg in the morning (1 tab) and take 12.5 mg (1/2 tab) in the evening.  *If you need a refill on your cardiac medications before your next appointment, please call your pharmacy*   Lab Work: BMET today   Testing/Procedures: Your physician has requested that you have an echocardiogram. Echocardiography is a painless test that uses sound waves to create images of your heart. It provides your doctor with information about the size and shape of your heart and how well your heart's chambers and valves are working. This procedure takes approximately one hour. There are no restrictions for this procedure. Please do NOT wear cologne, perfume, aftershave, or lotions (deodorant is allowed). Please arrive 15 minutes prior to your appointment time.    Follow-Up: At [facility], you and your health needs are our priority.  As part of our continuing mission to provide you with exceptional heart care, we have created designated Provider Care Teams.  These Care Teams include your primary Cardiologist (physician) and Advanced Practice Providers (APPs -  Physician Assistants and Nurse Practitioners) who all work together to provide you with the care you need, when you need it.  We recommend signing up for the patient portal called "MyChart".  Sign up information is provided on this After Visit Summary.  MyChart is used to connect with patients for Virtual Visits (Telemedicine).  Patients are able to view lab/test results, encounter notes, upcoming appointments, etc.  Non-urgent messages can be sent to your provider as well.   To learn more about what you can do with MyChart, go to [email or web address].    Your next appointment:   8-12 week(s)  Provider:   Any APP    Other Instructions Weigh daily. Weight log given. Please contact our office if you gain 3 lb overnight or 5 lb in 1 week

## 2022-08-20 LAB — BASIC METABOLIC PANEL
BUN/Creatinine Ratio: 12 (ref 12–28)
BUN: 14 mg/dL (ref 8–27)
CO2: 27 mmol/L (ref 20–29)
Calcium: 9.9 mg/dL (ref 8.7–10.3)
Chloride: 105 mmol/L (ref 96–106)
Creatinine, Ser: 1.17 mg/dL — ABNORMAL HIGH (ref 0.57–1.00)
Glucose: 87 mg/dL (ref 70–99)
Potassium: 5.3 mmol/L — ABNORMAL HIGH (ref 3.5–5.2)
Sodium: 145 mmol/L — ABNORMAL HIGH (ref 134–144)
eGFR: 45 mL/min/{1.73_m2} — ABNORMAL LOW (ref >59)

## 2022-08-24 ENCOUNTER — Telehealth: Payer: Self-pay

## 2022-08-24 ENCOUNTER — Other Ambulatory Visit: Payer: Self-pay | Admitting: Cardiovascular Disease

## 2022-08-24 ENCOUNTER — Other Ambulatory Visit (HOSPITAL_COMMUNITY): Payer: Self-pay

## 2022-08-24 MED ORDER — METOPROLOL TARTRATE 25 MG PO TABS
25.0000 mg | ORAL_TABLET | Freq: Two times a day (BID) | ORAL | 3 refills | Status: DC
  Filled 2022-08-24: qty 180, 90d supply, fill #0
  Filled 2023-02-17: qty 60, 30d supply, fill #1
  Filled 2023-04-25: qty 60, 30d supply, fill #2

## 2022-08-24 NOTE — Telephone Encounter (Addendum)
Left voice message for patient per DPR. Will try calling again   ----- Message from Bonnie Levering, NP sent at 08/20/2022  6:26 AM EDT ----- Please let patient know kidney function is stable. Increase hydration. Potassium is slightly elevated.  Limit foods high in potassium such as potatoes, sweet potatoes, citrus fruits/juices, dried fruits.    Thank you!  DW

## 2022-08-25 ENCOUNTER — Other Ambulatory Visit (HOSPITAL_COMMUNITY): Payer: Self-pay

## 2022-08-31 ENCOUNTER — Other Ambulatory Visit (HOSPITAL_COMMUNITY): Payer: Self-pay

## 2022-09-03 NOTE — Telephone Encounter (Signed)
Patient's son is requesting call back in regards to results.

## 2022-09-03 NOTE — Telephone Encounter (Signed)
Spoke with patient's son Loraine Leriche per DPR he is aware of lab results and provider recommendations. Verbalized understanding.

## 2022-09-15 ENCOUNTER — Other Ambulatory Visit (HOSPITAL_COMMUNITY): Payer: Self-pay

## 2022-09-24 ENCOUNTER — Ambulatory Visit (HOSPITAL_COMMUNITY): Payer: Medicare Other | Attending: Student

## 2022-09-24 DIAGNOSIS — Z79899 Other long term (current) drug therapy: Secondary | ICD-10-CM | POA: Diagnosis not present

## 2022-09-24 DIAGNOSIS — R0609 Other forms of dyspnea: Secondary | ICD-10-CM | POA: Insufficient documentation

## 2022-09-24 LAB — ECHOCARDIOGRAM COMPLETE
AR max vel: 1.89 cm2
AV Area VTI: 1.97 cm2
AV Area mean vel: 1.69 cm2
AV Mean grad: 17.7 mmHg
AV Peak grad: 30.5 mmHg
Ao pk vel: 2.76 m/s
Calc EF: 59.5 %
MV M vel: 5.5 m/s
MV Peak grad: 121 mmHg
Radius: 0.3 cm
S' Lateral: 2.5 cm
Single Plane A2C EF: 57 %
Single Plane A4C EF: 61.9 %

## 2022-10-08 ENCOUNTER — Encounter: Payer: Medicare Other | Admitting: Nurse Practitioner

## 2022-10-08 ENCOUNTER — Encounter: Payer: Self-pay | Admitting: Nurse Practitioner

## 2022-10-08 NOTE — Progress Notes (Signed)
This encounter was created in error - please disregard.

## 2022-10-14 NOTE — Progress Notes (Deleted)
Cardiology Clinic Note   Patient Name: [name] Date of Encounter: 10/14/2022  Primary Care Provider:  Sharon Seller, NP Primary Cardiologist:  Nanetta Batty, MD  Patient Profile    89 year old female with history of permanent atrial fibrillation, cardiac unaware, chronic lower extremity edema on Lasix as needed aortic valve stenosis status post AVR July 2014, chronic dyspnea on exertion pleural effusion of the right lung noted in the setting of recurrent chest pain in January 2024.  When last seen in the office on 08/19/2022 she appeared confused which apparently is baseline for her according to her son.Marland Kitchen  She was accompanied by her son who is a poor historian as he did not live with her.  He did report that she did have some mild dyspnea on exertion.  She was not eating well.  On that visit by Meriel Flavors born, DNP, metoprolol was decreased to 12.5 mg in the evening due to bradycardia.  Past Medical History    Past Medical History:  Diagnosis Date   Aortic stenosis 07/20/2012   Aortic stenosis    Arthritis    Cancer (HCC)    Breast   Carotid bruit 08/06/2008   Doppler - R ECA demonstrates noarrowing w/ elevated velocities consistent w/ >70% diameter reduction; R and L ICAs show no evidence of diameter reduction, significant tortuosity or vascular abnormality;    COPD (chronic obstructive pulmonary disease) (HCC)    Hearing loss    Heart murmur    Hyperlipidemia    Hypertension    dr Allyson Sabal   Osteoporosis    PAF (paroxysmal atrial fibrillation) (HCC) 09/04/2012   PVD (peripheral vascular disease) (HCC) 08/13/2010   R/P MV - normal pattern of perfusion in all regions, EF 76%; no significant wall abnormalities noted; normal perfusion study   Right carotid bruit    high-grade right external carotid artery stenosis by 2 parts ultrasound 2 years ago   S/P aortic valve replacement with bioprosthetic valve 08/30/2012   21 mm [facility] Ease bovine pericardial tissue  valve   Stroke (cerebrum) [facility])    Past Surgical History:  Procedure Laterality Date   ABDOMINAL HYSTERECTOMY     Partial   AORTIC VALVE REPLACEMENT N/A 08/30/2012   Procedure: AORTIC VALVE REPLACEMENT (AVR);  Surgeon: Purcell Nails, MD;  Location: [facility] OR;  Service: Open Heart Surgery;  Laterality: N/A;   BIOPSY SHOULDER Left 10/08/2005   shave biopsy   BREAST LUMPECTOMY  02/25/1997   right   CARDIAC CATHETERIZATION     CHOLECYSTECTOMY N/A 01/25/2022   Procedure: LAPAROSCOPIC CHOLECYSTECTOMY;  Surgeon: Axel Filler, MD;  Location: [facility] OR;  Service: General;  Laterality: N/A;   COLON SURGERY  1959   COSMETIC SURGERY Left 1997   Breast implant   EYE SURGERY  1997   Cataract surgery   INTRAOPERATIVE TRANSESOPHAGEAL ECHOCARDIOGRAM N/A 08/30/2012   Procedure: INTRAOPERATIVE TRANSESOPHAGEAL ECHOCARDIOGRAM;  Surgeon: Purcell Nails, MD;  Location: [facility] OR;  Service: Open Heart Surgery;  Laterality: N/A;   LEFT AND RIGHT HEART CATHETERIZATION WITH CORONARY ANGIOGRAM N/A 08/14/2012   Procedure: LEFT AND RIGHT HEART CATHETERIZATION WITH CORONARY ANGIOGRAM;  Surgeon: Runell Gess, MD;  Location: [facility] CATH LAB;  Service: Cardiovascular;  Laterality: N/A;   LEFT HEART CATH  08/14/12   Nl cors, AS   MASTECTOMY  09/29/95   left   PARTIAL HYSTERECTOMY     TOTAL HIP ARTHROPLASTY  09/2010    Allergies  Allergies  Allergen Reactions   Lisinopril Cough  Ceftriaxone Rash   Codeine Rash    All over the body.    History of Present Illness    Mrs. Lanzo returns for ongoing assessment and management of permanent atrial fibrillation, medication adjustment to reduce metoprolol to 12.5 mg in the evening dose to prevent bradycardia, history of lower extremity edema on Lasix,, aortic stenosis status post AVR.  Chronic confusion at baseline.  Home Medications    Current Outpatient Medications  Medication Sig Dispense Refill   acetaminophen (TYLENOL) 650 MG CR tablet Take 1 tablet (650 mg total) by  mouth 2 (two) times daily.     albuterol (VENTOLIN HFA) 108 (90 Base) MCG/ACT inhaler Inhale 2 puffs into the lungs every 6 (six) hours as needed for wheezing or shortness of breath. 6.7 g 0   apixaban (ELIQUIS) 5 MG TABS tablet Take 1 tablet (5 mg total) by mouth 2 (two) times daily. 180 tablet 1   Ascorbic Acid (VITAMIN C) 100 MG tablet Take 100 mg by mouth daily.     atorvastatin (LIPITOR) 40 MG tablet Take 1 tablet (40 mg total) by mouth daily. 90 tablet 1   Calcium Citrate-Vitamin D (CITRACAL + D PO) Take 1 tablet by mouth daily.     diclofenac Sodium (VOLTAREN) 1 % GEL Apply 4 g topically 4 (four) times daily as needed. 100 g 1   furosemide (LASIX) 20 MG tablet Take 1 tablet (20 mg total) by mouth daily. 30 tablet 3   Magnesium Hydroxide (PHILLIPS MILK OF MAGNESIA PO) Take 15 mLs by mouth at bedtime. Take daily at 6 o'clock at night     metoprolol tartrate (LOPRESSOR) 25 MG tablet Take 1 tablet (25 mg total) by mouth 2 (two) times daily. 180 tablet 3   mirabegron ER (MYRBETRIQ) 50 MG TB24 tablet Take 1 tablet (50 mg total) by mouth daily. 90 tablet 1   mirtazapine (REMERON) 7.5 MG tablet Take 1 tablet (7.5 mg total) by mouth at bedtime. 30 tablet 3   pantoprazole (PROTONIX) 20 MG tablet Take 1 tablet (20 mg total) by mouth daily. 30 tablet 0   potassium chloride (KLOR-CON M) 10 MEQ tablet Take 1 tablet (10 mEq total) by mouth daily as needed. 30 tablet 1   valsartan (DIOVAN) 160 MG tablet Take 1 tablet (160 mg total) by mouth in the morning. 90 tablet 1   valsartan (DIOVAN) 80 MG tablet Take 1 tablet (80 mg total) by mouth every evening. 90 tablet 1   No current facility-administered medications for this visit.     Family History    Family History  Problem Relation Age of Onset   Cancer Mother        Bladder   Early death Father        Tractor accident   Early death Brother        House fire   Early death Brother        during heart surgery   She indicated that her mother is  deceased. She indicated that her father is deceased. She indicated that her sister is deceased. She indicated that both of her brothers are deceased. She indicated that her daughter is alive. She indicated that her son is alive.  Social History    Social History   Socioeconomic History   Marital status: Widowed    Spouse name: Not on file   Number of children: 2   Years of education: 9   Highest education level: Not on file  Occupational History  Occupation: retired  Tobacco Use   Smoking status: Never   Smokeless tobacco: Never  Vaping Use   Vaping status: Never Used  Substance and Sexual Activity   Alcohol use: No   Drug use: No   Sexual activity: Never  Other Topics Concern   Not on file  Social History Narrative   Right handed   One story home   Social Determinants of Health   Financial Resource Strain: Low Risk  (05/23/2017)   Overall Financial Resource Strain (CARDIA)    Difficulty of Paying Living Expenses: Not hard at all  Food Insecurity: No Food Insecurity (05/23/2017)   Hunger Vital Sign    Worried About Running Out of Food in the Last Year: Never true    Ran Out of Food in the Last Year: Never true  Transportation Needs: No Transportation Needs (05/23/2017)   PRAPARE - Administrator, Civil Service (Medical): No    Lack of Transportation (Non-Medical): No  Physical Activity: Insufficiently Active (05/23/2017)   Exercise Vital Sign    Days of Exercise per Week: 7 days    Minutes of Exercise per Session: 10 min  Stress: No Stress Concern Present (05/23/2017)   [employer] of Occupational Health - Occupational Stress Questionnaire    Feeling of Stress : Only a little  Social Connections: Somewhat Isolated (05/23/2017)   Social Connection and Isolation Panel [NHANES]    Frequency of Communication with Friends and Family: More than three times a week    Frequency of Social Gatherings with Friends and Family: More than three times a week     Attends Religious Services: More than 4 times per year    Active Member of [employer] or Organizations: No    Attends Banker Meetings: Never    Marital Status: Widowed  Intimate Partner Violence: Not At Risk (05/23/2017)   Humiliation, Afraid, Rape, and Kick questionnaire    Fear of Current or Ex-Partner: No    Emotionally Abused: No    Physically Abused: No    Sexually Abused: No     Review of Systems    General:  No chills, fever, night sweats or weight changes.  Cardiovascular:  No chest pain, dyspnea on exertion, edema, orthopnea, palpitations, paroxysmal nocturnal dyspnea. Dermatological: No rash, lesions/masses Respiratory: No cough, dyspnea Urologic: No hematuria, dysuria Abdominal:   No nausea, vomiting, diarrhea, bright red blood per rectum, melena, or hematemesis Neurologic:  No visual changes, wkns, changes in mental status. All other systems reviewed and are otherwise negative except as noted above.       Physical Exam    VS:  There were no vitals taken for this visit. , BMI There is no height or weight on file to calculate BMI.     GEN: Well nourished, well developed, in no acute distress. HEENT: normal. Neck: Supple, no JVD, carotid bruits, or masses. Cardiac: RRR, no murmurs, rubs, or gallops. No clubbing, cyanosis, edema.  Radials/DP/PT 2+ and equal bilaterally.  Respiratory:  Respirations regular and unlabored, clear to auscultation bilaterally. GI: Soft, nontender, nondistended, BS + x 4. MS: no deformity or atrophy. Skin: warm and dry, no rash. Neuro:  Strength and sensation are intact. Psych: Normal affect.      Lab Results  Component Value Date   WBC 7.1 07/02/2022   HGB 11.7 07/02/2022   HCT 34.3 (L) 07/02/2022   MCV 98.3 07/02/2022   PLT 138 (L) 07/02/2022   Lab Results  Component Value Date  CREATININE 1.17 (H) 08/19/2022   BUN 14 08/19/2022   NA 145 (H) 08/19/2022   K 5.3 (H) 08/19/2022   CL 105 08/19/2022   CO2 27 08/19/2022    Lab Results  Component Value Date   ALT 9 07/02/2022   AST 17 07/02/2022   ALKPHOS 80 03/22/2022   BILITOT 1.0 07/02/2022   Lab Results  Component Value Date   CHOL 113 07/02/2022   HDL 57 07/02/2022   LDLCALC 41 07/02/2022   TRIG 72 07/02/2022   CHOLHDL 2.0 07/02/2022    Lab Results  Component Value Date   HGBA1C 5.3 06/24/2020     Review of Prior Studies    Echocardiogram 09/24/2022 1. Left ventricular ejection fraction, by estimation, is 60 to 65%. The  left ventricle has normal function. The left ventricle has no regional  wall motion abnormalities. Left ventricular diastolic parameters are  indeterminate.   2. Right ventricular systolic function is normal. The right ventricular  size is normal. There is normal pulmonary artery systolic pressure.   3. Left atrial size was severely dilated.   4. Right atrial size was severely dilated.   5. The mitral valve is normal in structure. Mild mitral valve  regurgitation. No evidence of mitral stenosis.   6. Tricuspid valve regurgitation is moderate.   7. TAVR mean gradient was 11.6 mmHg 07/2021 and is 17.7 mmHg now. The  aortic valve is normal in structure. Aortic valve regurgitation is mild.  No aortic stenosis is present. There is a 21 mm Edwards bovine valve  present in the aortic position. Procedure  Date: 08/30/12. Aortic valve area, by VTI measures 1.97 cm. Aortic valve  mean gradient measures 17.7 mmHg. Aortic valve Vmax measures 2.76 m/s.   8. The inferior vena cava is normal in size with greater than 50%  respiratory variability, suggesting right atrial pressure of 3 mmHg.   Assessment & Plan   1.  ***     {Are you ordering a CV Procedure (e.g. stress test, cath, DCCV, TEE, etc)?   Press F2        :[id number]}   Signed, Bettey Mare. Liborio Nixon, ANP, AACC   10/14/2022 5:08 PM      Office [phone] Fax [phone]  Notice: This dictation was prepared with Dragon dictation along with smaller phrase  technology. Any transcriptional errors that result from this process are unintentional and may not be corrected upon review.

## 2022-10-15 ENCOUNTER — Ambulatory Visit: Payer: Medicare Other | Attending: Adult Health | Admitting: Adult Health

## 2022-11-05 ENCOUNTER — Other Ambulatory Visit (HOSPITAL_COMMUNITY): Payer: Self-pay

## 2022-11-08 ENCOUNTER — Other Ambulatory Visit: Payer: Self-pay

## 2022-11-19 ENCOUNTER — Other Ambulatory Visit: Payer: Self-pay | Admitting: Family

## 2022-11-19 ENCOUNTER — Other Ambulatory Visit (HOSPITAL_COMMUNITY): Payer: Self-pay

## 2022-11-19 ENCOUNTER — Encounter: Payer: Medicare Other | Admitting: Nurse Practitioner

## 2022-11-19 ENCOUNTER — Other Ambulatory Visit: Payer: Self-pay | Admitting: Nurse Practitioner

## 2022-11-19 DIAGNOSIS — E876 Hypokalemia: Secondary | ICD-10-CM

## 2022-11-19 DIAGNOSIS — I10 Essential (primary) hypertension: Secondary | ICD-10-CM

## 2022-11-19 DIAGNOSIS — Z8673 Personal history of transient ischemic attack (TIA), and cerebral infarction without residual deficits: Secondary | ICD-10-CM

## 2022-11-19 MED ORDER — VALSARTAN 160 MG PO TABS
160.0000 mg | ORAL_TABLET | Freq: Every morning | ORAL | 1 refills | Status: DC
  Filled 2022-11-19: qty 5, 5d supply, fill #0
  Filled 2022-11-19: qty 85, 85d supply, fill #0
  Filled 2023-03-08: qty 90, 90d supply, fill #1

## 2022-11-19 MED ORDER — ATORVASTATIN CALCIUM 40 MG PO TABS
40.0000 mg | ORAL_TABLET | Freq: Every day | ORAL | 1 refills | Status: DC
  Filled 2022-11-19: qty 90, 90d supply, fill #0
  Filled 2023-02-17: qty 30, 30d supply, fill #1
  Filled 2023-03-15: qty 30, 30d supply, fill #2
  Filled 2023-04-25: qty 30, 30d supply, fill #3

## 2022-11-19 MED ORDER — POTASSIUM CHLORIDE CRYS ER 10 MEQ PO TBCR
10.0000 meq | EXTENDED_RELEASE_TABLET | Freq: Every day | ORAL | 1 refills | Status: DC | PRN
  Filled 2022-11-19: qty 30, 30d supply, fill #0
  Filled 2023-02-03: qty 30, 30d supply, fill #1

## 2022-11-19 NOTE — Progress Notes (Signed)
This encounter was created in error - please disregard.

## 2022-11-22 ENCOUNTER — Other Ambulatory Visit: Payer: Self-pay

## 2022-12-24 ENCOUNTER — Other Ambulatory Visit: Payer: Self-pay

## 2022-12-24 ENCOUNTER — Other Ambulatory Visit (HOSPITAL_COMMUNITY): Payer: Self-pay

## 2022-12-24 DIAGNOSIS — R0609 Other forms of dyspnea: Secondary | ICD-10-CM

## 2022-12-24 MED ORDER — FUROSEMIDE 20 MG PO TABS
20.0000 mg | ORAL_TABLET | Freq: Every day | ORAL | 3 refills | Status: DC
  Filled 2022-12-24: qty 30, 30d supply, fill #0
  Filled 2023-02-03: qty 30, 30d supply, fill #1
  Filled 2023-03-08: qty 30, 30d supply, fill #2
  Filled 2023-04-19: qty 30, 30d supply, fill #3

## 2023-01-13 ENCOUNTER — Other Ambulatory Visit: Payer: Self-pay | Admitting: Cardiovascular Disease

## 2023-01-13 ENCOUNTER — Other Ambulatory Visit (HOSPITAL_COMMUNITY): Payer: Self-pay

## 2023-01-13 DIAGNOSIS — I4821 Permanent atrial fibrillation: Secondary | ICD-10-CM

## 2023-01-13 MED ORDER — APIXABAN 5 MG PO TABS
5.0000 mg | ORAL_TABLET | Freq: Two times a day (BID) | ORAL | 1 refills | Status: DC
  Filled 2023-01-13: qty 60, 30d supply, fill #0
  Filled 2023-01-13: qty 180, 90d supply, fill #0
  Filled 2023-03-15: qty 60, 30d supply, fill #1
  Filled 2023-05-20: qty 60, 30d supply, fill #2
  Filled 2023-07-20: qty 60, 30d supply, fill #3
  Filled 2023-09-30: qty 60, 30d supply, fill #4
  Filled 2023-12-01: qty 60, 30d supply, fill #5

## 2023-01-13 NOTE — Telephone Encounter (Signed)
Eliquis 5mg  refill request received. Patient is 89 years old, weight-68.6kg, Crea-1.17 on 08/19/22, Diagnosis-Afib, and last seen by Carlos Levering on 08/19/22. Dose is appropriate based on dosing criteria. Will send in refill to requested pharmacy.

## 2023-02-03 ENCOUNTER — Other Ambulatory Visit (HOSPITAL_COMMUNITY): Payer: Self-pay

## 2023-02-17 ENCOUNTER — Other Ambulatory Visit (HOSPITAL_COMMUNITY): Payer: Self-pay

## 2023-02-23 ENCOUNTER — Encounter (HOSPITAL_COMMUNITY): Payer: Self-pay

## 2023-02-23 ENCOUNTER — Emergency Department (HOSPITAL_COMMUNITY): Payer: Medicare Other

## 2023-02-23 ENCOUNTER — Emergency Department (HOSPITAL_COMMUNITY)
Admission: EM | Admit: 2023-02-23 | Discharge: 2023-02-24 | Disposition: A | Discharge status: Home / Self Care | Payer: Medicare Other | Attending: Emergency Medicine | Admitting: Emergency Medicine

## 2023-02-23 DIAGNOSIS — I10 Essential (primary) hypertension: Secondary | ICD-10-CM | POA: Insufficient documentation

## 2023-02-23 DIAGNOSIS — J449 Chronic obstructive pulmonary disease, unspecified: Secondary | ICD-10-CM | POA: Diagnosis not present

## 2023-02-23 DIAGNOSIS — N3000 Acute cystitis without hematuria: Secondary | ICD-10-CM | POA: Diagnosis not present

## 2023-02-23 DIAGNOSIS — Z96641 Presence of right artificial hip joint: Secondary | ICD-10-CM | POA: Diagnosis not present

## 2023-02-23 DIAGNOSIS — Z7901 Long term (current) use of anticoagulants: Secondary | ICD-10-CM | POA: Diagnosis not present

## 2023-02-23 DIAGNOSIS — M25572 Pain in left ankle and joints of left foot: Secondary | ICD-10-CM | POA: Diagnosis not present

## 2023-02-23 DIAGNOSIS — R300 Dysuria: Secondary | ICD-10-CM

## 2023-02-23 DIAGNOSIS — W19XXXA Unspecified fall, initial encounter: Secondary | ICD-10-CM | POA: Diagnosis not present

## 2023-02-23 DIAGNOSIS — S299XXA Unspecified injury of thorax, initial encounter: Secondary | ICD-10-CM | POA: Diagnosis not present

## 2023-02-23 DIAGNOSIS — M25551 Pain in right hip: Secondary | ICD-10-CM | POA: Diagnosis not present

## 2023-02-23 DIAGNOSIS — J40 Bronchitis, not specified as acute or chronic: Secondary | ICD-10-CM | POA: Diagnosis not present

## 2023-02-23 DIAGNOSIS — S0990XA Unspecified injury of head, initial encounter: Secondary | ICD-10-CM | POA: Diagnosis not present

## 2023-02-23 DIAGNOSIS — S199XXA Unspecified injury of neck, initial encounter: Secondary | ICD-10-CM | POA: Diagnosis not present

## 2023-02-23 DIAGNOSIS — I517 Cardiomegaly: Secondary | ICD-10-CM | POA: Diagnosis not present

## 2023-02-23 DIAGNOSIS — R3915 Urgency of urination: Secondary | ICD-10-CM

## 2023-02-23 DIAGNOSIS — G9389 Other specified disorders of brain: Secondary | ICD-10-CM | POA: Diagnosis not present

## 2023-02-23 DIAGNOSIS — M47811 Spondylosis without myelopathy or radiculopathy, occipito-atlanto-axial region: Secondary | ICD-10-CM | POA: Diagnosis not present

## 2023-02-23 DIAGNOSIS — I4891 Unspecified atrial fibrillation: Secondary | ICD-10-CM | POA: Diagnosis not present

## 2023-02-23 DIAGNOSIS — M4802 Spinal stenosis, cervical region: Secondary | ICD-10-CM | POA: Diagnosis not present

## 2023-02-23 DIAGNOSIS — Z79899 Other long term (current) drug therapy: Secondary | ICD-10-CM | POA: Insufficient documentation

## 2023-02-23 DIAGNOSIS — I6782 Cerebral ischemia: Secondary | ICD-10-CM | POA: Diagnosis not present

## 2023-02-23 DIAGNOSIS — M47812 Spondylosis without myelopathy or radiculopathy, cervical region: Secondary | ICD-10-CM | POA: Diagnosis not present

## 2023-02-23 LAB — COMPREHENSIVE METABOLIC PANEL
ALT: 13 U/L (ref 0–44)
AST: 22 U/L (ref 15–41)
Albumin: 3.2 g/dL — ABNORMAL LOW (ref 3.5–5.0)
Alkaline Phosphatase: 46 U/L (ref 38–126)
Anion gap: 10 (ref 5–15)
BUN: 20 mg/dL (ref 8–23)
CO2: 24 mmol/L (ref 22–32)
Calcium: 9.1 mg/dL (ref 8.9–10.3)
Chloride: 107 mmol/L (ref 98–111)
Creatinine, Ser: 1.27 mg/dL — ABNORMAL HIGH (ref 0.44–1.00)
GFR, Estimated: 40 mL/min — ABNORMAL LOW (ref >60)
Glucose, Bld: 88 mg/dL (ref 70–99)
Potassium: 4.4 mmol/L (ref 3.5–5.1)
Sodium: 141 mmol/L (ref 135–145)
Total Bilirubin: 1.1 mg/dL (ref <1.2)
Total Protein: 5.3 g/dL — ABNORMAL LOW (ref 6.5–8.1)

## 2023-02-23 LAB — I-STAT CHEM 8, ED
BUN: 24 mg/dL — ABNORMAL HIGH (ref 8–23)
Calcium, Ion: 1.15 mmol/L (ref 1.15–1.40)
Chloride: 106 mmol/L (ref 98–111)
Creatinine, Ser: 1.3 mg/dL — ABNORMAL HIGH (ref 0.44–1.00)
Glucose, Bld: 82 mg/dL (ref 70–99)
HCT: 31 % — ABNORMAL LOW (ref 36.0–46.0)
Hemoglobin: 10.5 g/dL — ABNORMAL LOW (ref 12.0–15.0)
Potassium: 4.3 mmol/L (ref 3.5–5.1)
Sodium: 140 mmol/L (ref 135–145)
TCO2: 26 mmol/L (ref 22–32)

## 2023-02-23 LAB — URINALYSIS, ROUTINE W REFLEX MICROSCOPIC
Bilirubin Urine: NEGATIVE
Glucose, UA: NEGATIVE mg/dL
Hgb urine dipstick: NEGATIVE
Ketones, ur: NEGATIVE mg/dL
Nitrite: POSITIVE — AB
Protein, ur: NEGATIVE mg/dL
Specific Gravity, Urine: 1.009 (ref 1.005–1.030)
WBC, UA: >50 WBC/hpf (ref 0–5)
pH: 6 (ref 5.0–8.0)

## 2023-02-23 LAB — CBC
HCT: 32.1 % — ABNORMAL LOW (ref 36.0–46.0)
Hemoglobin: 10.7 g/dL — ABNORMAL LOW (ref 12.0–15.0)
MCH: 33.6 pg (ref 26.0–34.0)
MCHC: 33.3 g/dL (ref 30.0–36.0)
MCV: 100.9 fL — ABNORMAL HIGH (ref 80.0–100.0)
Platelets: 119 10*3/uL — ABNORMAL LOW (ref 150–400)
RBC: 3.18 MIL/uL — ABNORMAL LOW (ref 3.87–5.11)
RDW: 13.4 % (ref 11.5–15.5)
WBC: 6 10*3/uL (ref 4.0–10.5)
nRBC: 0 % (ref 0.0–0.2)

## 2023-02-23 LAB — I-STAT CG4 LACTIC ACID, ED: Lactic Acid, Venous: 0.6 mmol/L (ref 0.5–1.9)

## 2023-02-23 MED ORDER — FOSFOMYCIN TROMETHAMINE 3 G PO PACK
3.0000 g | PACK | Freq: Once | ORAL | Status: AC
  Administered 2023-02-23: 3 g via ORAL
  Filled 2023-02-23: qty 3

## 2023-02-23 NOTE — ED Provider Notes (Incomplete)
[place] EMERGENCY DEPARTMENT AT [facility] Provider Note   CSN: [id number] Arrival date & time: 02/23/23  1835     History {Add pertinent medical, surgical, social history, OB history to HPI:1} Chief Complaint  Patient presents with  . Fall    [name] is a 89 y.o. female.   Fall       Home Medications Prior to Admission medications   Medication Sig Start Date End Date Taking? Authorizing Provider  acetaminophen (TYLENOL) 650 MG CR tablet Take 1 tablet (650 mg total) by mouth 2 (two) times daily. 12/25/21   Sharon Seller, NP  albuterol (VENTOLIN HFA) 108 (90 Base) MCG/ACT inhaler Inhale 2 puffs into the lungs every 6 (six) hours as needed for wheezing or shortness of breath. 05/05/22   Sharon Seller, NP  apixaban (ELIQUIS) 5 MG TABS tablet Take 1 tablet (5 mg total) by mouth 2 (two) times daily. 01/13/23   Runell Gess, MD  Ascorbic Acid (VITAMIN C) 100 MG tablet Take 100 mg by mouth daily.    [provider]  atorvastatin (LIPITOR) 40 MG tablet Take 1 tablet (40 mg total) by mouth daily. 11/19/22   Sharon Seller, NP  Calcium Citrate-Vitamin D (CITRACAL + D PO) Take 1 tablet by mouth daily.    [provider]  diclofenac Sodium (VOLTAREN) 1 % GEL Apply 4 g topically 4 (four) times daily as needed. 12/25/21   Sharon Seller, NP  furosemide (LASIX) 20 MG tablet Take 1 tablet (20 mg total) by mouth daily. 12/24/22   Sharon Seller, NP  Magnesium Hydroxide (PHILLIPS MILK OF MAGNESIA PO) Take 15 mLs by mouth at bedtime. Take daily at 6 o'clock at night    [provider]  metoprolol tartrate (LOPRESSOR) 25 MG tablet Take 1 tablet (25 mg total) by mouth 2 (two) times daily. 08/24/22   Runell Gess, MD  mirabegron ER (MYRBETRIQ) 50 MG TB24 tablet Take 1 tablet (50 mg total) by mouth daily. 04/15/22   Sharon Seller, NP  mirtazapine (REMERON) 7.5 MG tablet Take 1 tablet (7.5 mg total) by mouth at bedtime.  07/22/22   Ngetich, [name] C, NP  pantoprazole (PROTONIX) 20 MG tablet Take 1 tablet (20 mg total) by mouth daily. 03/23/22   Netta Corrigan, PA-C  potassium chloride (KLOR-CON M) 10 MEQ tablet Take 1 tablet (10 mEq total) by mouth daily as needed. 11/19/22   Sharon Seller, NP  valsartan (DIOVAN) 160 MG tablet Take 1 tablet (160 mg total) by mouth in the morning. 11/19/22   Sharon Seller, NP  valsartan (DIOVAN) 80 MG tablet Take 1 tablet (80 mg total) by mouth every evening. 03/19/22   Ngetich, [name] C, NP      Allergies    Lisinopril, Ceftriaxone, and Codeine    Review of Systems   Review of Systems  Physical Exam Updated Vital Signs BP (!) 193/89   Pulse 92   Temp 98 F (36.7 C)   Resp 19   SpO2 100%  Physical Exam  ED Results / Procedures / Treatments   Labs (all labs ordered are listed, but only abnormal results are displayed) Labs Reviewed  COMPREHENSIVE METABOLIC PANEL - Abnormal; Notable for the following components:      Result Value   Creatinine, Ser 1.27 (*)    Total Protein 5.3 (*)    Albumin 3.2 (*)    GFR, Estimated 40 (*)    All other  components within normal limits  CBC - Abnormal; Notable for the following components:   RBC 3.18 (*)    Hemoglobin 10.7 (*)    HCT 32.1 (*)    MCV 100.9 (*)    Platelets 119 (*)    All other components within normal limits  URINALYSIS, ROUTINE W REFLEX MICROSCOPIC - Abnormal; Notable for the following components:   APPearance HAZY (*)    Nitrite POSITIVE (*)    Leukocytes,Ua LARGE (*)    Bacteria, UA RARE (*)    All other components within normal limits  I-STAT CHEM 8, ED - Abnormal; Notable for the following components:   BUN 24 (*)    Creatinine, Ser 1.30 (*)    Hemoglobin 10.5 (*)    HCT 31.0 (*)    All other components within normal limits  URINE CULTURE  I-STAT CG4 LACTIC ACID, ED    EKG None  Radiology CT CERVICAL SPINE WO CONTRAST Result Date: 02/23/2023 CLINICAL DATA:  Poly trauma, blunt due to  a fall EXAM: CT CERVICAL SPINE WITHOUT CONTRAST TECHNIQUE: Multidetector CT imaging of the cervical spine was performed without intravenous contrast. Multiplanar CT image reconstructions were also generated. RADIATION DOSE REDUCTION: This exam was performed according to the departmental dose-optimization program which includes automated exposure control, adjustment of the mA and/or kV according to patient size and/or use of iterative reconstruction technique. COMPARISON:  MRI cervical spine 06/26/2020 FINDINGS: Alignment: Normal alignment. Skull base and vertebrae: No acute fracture. No primary bone lesion or focal pathologic process. Soft tissues and spinal canal: No prevertebral fluid or swelling. No visible canal hematoma. Disc levels: Diffuse degenerative change with disc space narrowing and endplate osteophyte formation throughout. Degenerative changes at C1-2 with complete loss of the space between the arch of C1 in the odontoid process. Degenerative changes in the facet joints. Upper chest: Pleural calcification in the apices. Other: Degenerative changes in the temporomandibular joints. IMPRESSION: 1. Normal alignment.  No acute displaced fractures identified. 2. Moderate diffuse degenerative change throughout the cervical spine. Degenerative changes in the temporomandibular joints. 3. Apical pleural calcification, possibly postinflammatory. Electronically Signed   By: Burman Nieves M.D.   On: 02/23/2023 20:29   CT HEAD WO CONTRAST Result Date: 02/23/2023 CLINICAL DATA:  Head trauma due to a fall.  Moderate to severe. EXAM: CT HEAD WITHOUT CONTRAST TECHNIQUE: Contiguous axial images were obtained from the base of the skull through the vertex without intravenous contrast. RADIATION DOSE REDUCTION: This exam was performed according to the departmental dose-optimization program which includes automated exposure control, adjustment of the mA and/or kV according to patient size and/or use of iterative  reconstruction technique. COMPARISON:  MRI brain 06/24/2020.  CT head 06/24/2020 FINDINGS: Brain: Diffuse cerebral atrophy. Ventricular dilatation consistent with central atrophy. Low-attenuation changes in the deep white matter consistent with small vessel ischemia. No abnormal extra-axial fluid collections. No mass effect or midline shift. Gray-white matter junctions are distinct. Basal cisterns are not effaced. No acute intracranial hemorrhage. Vascular: No hyperdense vessel or unexpected calcification. Skull: Normal. Negative for fracture or focal lesion. Sinuses/Orbits: Mild mucosal thickening in the paranasal sinuses. No acute air-fluid levels. Mastoid air cells are clear. Other: None. IMPRESSION: No acute intracranial abnormality. Chronic atrophy and small vessel ischemic changes similar to previous studies. Electronically Signed   By: Burman Nieves M.D.   On: 02/23/2023 20:21   DG Hip Unilat W or Wo Pelvis 2-3 Views Right Result Date: 02/23/2023 CLINICAL DATA:  Fall, hip pain EXAM: DG HIP (WITH  OR WITHOUT PELVIS) 2-3V RIGHT COMPARISON:  10/15/2010 FINDINGS: Edges of prior right hip replacement. No hardware complicating feature. No acute fracture, subluxation or dislocation. IMPRESSION: Right hip replacement.  No acute bony abnormality. Electronically Signed   By: Charlett Nose M.D.   On: 02/23/2023 19:40   DG Chest Port 1 View Result Date: 02/23/2023 CLINICAL DATA:  Trauma. EXAM: PORTABLE CHEST 1 VIEW COMPARISON:  Chest radiograph dated 05/07/2022. FINDINGS: No focal consolidation, pleural effusion, or pneumothorax. There is diffuse chronic neutral coarsening and bronchitic changes. Stable cardiomegaly. Median sternotomy wires and mechanical cardiac valve. No acute osseous pathology. IMPRESSION: 1. No active disease. 2. Cardiomegaly. Electronically Signed   By: Elgie Collard M.D.   On: 02/23/2023 19:39    Procedures Procedures  {Document cardiac monitor, telemetry assessment procedure when  appropriate:1}  Medications Ordered in ED Medications  fosfomycin (MONUROL) packet 3 g (has no administration in time range)    ED Course/ Medical Decision Making/ A&P   {   Click here for ABCD2, HEART and other calculatorsREFRESH Note before signing :1}                              Medical Decision Making Amount and/or Complexity of Data Reviewed Labs: ordered. Radiology: ordered.  Risk Prescription drug management.    [name] is a 89 y.o. female with a past medical history significant for hypertension, dyslipidemia, GERD, previous aortic valve replacement, atrial fibrillation on Eliquis therapy, COPD, previous mastectomy, and previous hip surgery who presents with fall.  According to patient, she had a fall about 5 minutes morning and family heard a thump.  She was able to get back in bed but does not want to get up since.  She is reported having pain in her right hip but then she reports she did not think she hit her head.  She is not in headache or neck pain but as she is not certain anticipate will get head imaging.  EMS reported reassuring vital signs on transport and she otherwise has no complaints.  On exam, lungs clear.  Chest nontender.  Abdomen nontender.  Back not focally tender.  Neck nontender.  Head nontender.  Pupil symmetric and reactive with normal extraocular movements.  Intact sensation and strength in extremities.  Some tenderness of the right hip.  Patient otherwise well-appearing.  Will get CT of the head and neck and will get x-ray of the pelvis/right hip.  Will get chest x-ray as well in case there is a fracture and given her fall.  As she does not think she hit her head and is not having headache, will hold on level 2 activation as there is low suspicion for head injury despite her Eliquis use.  She will also get screening labs in case she needs to be admitted.  If imaging reassuring and she is feeling better, anticipate discharge home.  11:23  PM Patient's workup began to return.  Imaging did not show evidence of significant traumatic injuries.  CMP showed similar workup to prior.  Urinalysis did show nitrites leukocytes and bacteria and given her urgency I dysuria, will treat for UTI.  She reports that UTIs in the past however I am not concerned out [place] based on her exam at this time.  Will treat with a dose of fosfomycin and patient be discharged for outpatient follow-up.  Patient family agree with plan of care and patient will be discharged for outpatient follow-up.   {  Document critical care time when appropriate:1} {Document review of labs and clinical decision tools ie heart score, Chads2Vasc2 etc:1}  {Document your independent review of radiology images, and any outside records:1} {Document your discussion with family members, caretakers, and with consultants:1} {Document social determinants of health affecting pt's care:1} {Document your decision making why or why not admission, treatments were needed:1} Final Clinical Impression(s) / ED Diagnoses Final diagnoses:  None    Rx / DC Orders ED Discharge Orders     None

## 2023-02-23 NOTE — Discharge Instructions (Addendum)
Your history, exam and workup today did not show evidence of significant traumatic injuries.  Given your urinary symptoms we checked a urine and you do have urinary tract infection.  We treated you with a dose of fosfomycin which should treat it however we did send a culture.  Please rest and stay hydrated and follow-up with your primary doctor.  Please take your home medicines.  If any symptoms change or worsen acutely, return to the nearest emergency department.

## 2023-02-23 NOTE — ED Triage Notes (Addendum)
Pt BIB [facility] from home d/t hip pain resulted from a fall this morning. Pt missed a step this morning, fell and c/o R side hip pain. No deformities noted . Pelvis stable. Pt ambulates w a walker at baseline. A&O X3 . Do not know what the baseline is... lives w her son .  Pt On warfarin.   BP 130/80 HR 54  SPO2  97% ra

## 2023-02-23 NOTE — ED Notes (Signed)
1st lactic normal, 2nd can be discontinued not needed

## 2023-02-23 NOTE — ED Provider Notes (Signed)
[place] EMERGENCY DEPARTMENT AT [facility] Provider Note   CSN: [id number] Arrival date & time: 02/23/23  1835     History {Add pertinent medical, surgical, social history, OB history to HPI:1} Chief Complaint  Patient presents with   [name] is a 89 y.o. female.   Fall       Home Medications Prior to Admission medications   Medication Sig Start Date End Date Taking? Authorizing Provider  acetaminophen (TYLENOL) 650 MG CR tablet Take 1 tablet (650 mg total) by mouth 2 (two) times daily. 12/25/21   Sharon Seller, NP  albuterol (VENTOLIN HFA) 108 (90 Base) MCG/ACT inhaler Inhale 2 puffs into the lungs every 6 (six) hours as needed for wheezing or shortness of breath. 05/05/22   Sharon Seller, NP  apixaban (ELIQUIS) 5 MG TABS tablet Take 1 tablet (5 mg total) by mouth 2 (two) times daily. 01/13/23   Runell Gess, MD  Ascorbic Acid (VITAMIN C) 100 MG tablet Take 100 mg by mouth daily.    [provider]  atorvastatin (LIPITOR) 40 MG tablet Take 1 tablet (40 mg total) by mouth daily. 11/19/22   Sharon Seller, NP  Calcium Citrate-Vitamin D (CITRACAL + D PO) Take 1 tablet by mouth daily.    [provider]  diclofenac Sodium (VOLTAREN) 1 % GEL Apply 4 g topically 4 (four) times daily as needed. 12/25/21   Sharon Seller, NP  furosemide (LASIX) 20 MG tablet Take 1 tablet (20 mg total) by mouth daily. 12/24/22   Sharon Seller, NP  Magnesium Hydroxide (PHILLIPS MILK OF MAGNESIA PO) Take 15 mLs by mouth at bedtime. Take daily at 6 o'clock at night    [provider]  metoprolol tartrate (LOPRESSOR) 25 MG tablet Take 1 tablet (25 mg total) by mouth 2 (two) times daily. 08/24/22   Runell Gess, MD  mirabegron ER (MYRBETRIQ) 50 MG TB24 tablet Take 1 tablet (50 mg total) by mouth daily. 04/15/22   Sharon Seller, NP  mirtazapine (REMERON) 7.5 MG tablet Take 1 tablet (7.5 mg total) by mouth at bedtime.  07/22/22   Ngetich, [name] C, NP  pantoprazole (PROTONIX) 20 MG tablet Take 1 tablet (20 mg total) by mouth daily. 03/23/22   Netta Corrigan, PA-C  potassium chloride (KLOR-CON M) 10 MEQ tablet Take 1 tablet (10 mEq total) by mouth daily as needed. 11/19/22   Sharon Seller, NP  valsartan (DIOVAN) 160 MG tablet Take 1 tablet (160 mg total) by mouth in the morning. 11/19/22   Sharon Seller, NP  valsartan (DIOVAN) 80 MG tablet Take 1 tablet (80 mg total) by mouth every evening. 03/19/22   Ngetich, [name] C, NP      Allergies    Lisinopril, Ceftriaxone, and Codeine    Review of Systems   Review of Systems  Physical Exam Updated Vital Signs There were no vitals taken for this visit. Physical Exam  ED Results / Procedures / Treatments   Labs (all labs ordered are listed, but only abnormal results are displayed) Labs Reviewed - No data to display  EKG None  Radiology No results found.  Procedures Procedures  {Document cardiac monitor, telemetry assessment procedure when appropriate:1}  Medications Ordered in ED Medications - No data to display  ED Course/ Medical Decision Making/ A&P   {   Click here for ABCD2, HEART and other calculatorsREFRESH Note before signing :1}  Medical Decision Making Amount and/or Complexity of Data Reviewed Labs: ordered. Radiology: ordered.    [name] is a 89 y.o. female with a past medical history significant for hypertension, dyslipidemia, GERD, previous aortic valve replacement, atrial fibrillation on Eliquis therapy, COPD, previous mastectomy, and previous hip surgery who presents with fall.  According to patient, she had a fall about 5 minutes morning and family heard a thump.  She was able to get back in bed but does not want to get up since.  She is reported having pain in her right hip but then she reports she did not think she hit her head.  She is not in headache or neck pain but as she is not  certain anticipate will get head imaging.  EMS reported reassuring vital signs on transport and she otherwise has no complaints.  On exam, lungs clear.  Chest nontender.  Abdomen nontender.  Back not focally tender.  Neck nontender.  Head nontender.  Pupil symmetric and reactive with normal extraocular movements.  Intact sensation and strength in extremities.  Some tenderness of the right hip.  Patient otherwise well-appearing.  Will get CT of the head and neck and will get x-ray of the pelvis/right hip.  Will get chest x-ray as well in case there is a fracture and given her fall.  As she does not think she hit her head and is not having headache, will hold on level 2 activation as there is low suspicion for head injury despite her Eliquis use.  She will also get screening labs in case she needs to be admitted.  If imaging reassuring and she is feeling better, anticipate discharge home.   {Document critical care time when appropriate:1} {Document review of labs and clinical decision tools ie heart score, Chads2Vasc2 etc:1}  {Document your independent review of radiology images, and any outside records:1} {Document your discussion with family members, caretakers, and with consultants:1} {Document social determinants of health affecting pt's care:1} {Document your decision making why or why not admission, treatments were needed:1} Final Clinical Impression(s) / ED Diagnoses Final diagnoses:  None    Rx / DC Orders ED Discharge Orders     None

## 2023-02-24 MED ORDER — LIDOCAINE 5 % EX PTCH: 1.0000 | MEDICATED_PATCH | CUTANEOUS | 0 refills | Status: DC

## 2023-02-24 MED ORDER — TRAMADOL HCL 50 MG PO TABS: 25.0000 mg | ORAL_TABLET | Freq: Four times a day (QID) | ORAL | 0 refills | Status: DC | PRN

## 2023-02-26 LAB — URINE CULTURE: Culture: >=100000 — AB

## 2023-02-27 ENCOUNTER — Telehealth (HOSPITAL_BASED_OUTPATIENT_CLINIC_OR_DEPARTMENT_OTHER): Payer: Self-pay | Admitting: *Deleted

## 2023-02-27 NOTE — Telephone Encounter (Signed)
Post ED Visit - Positive Culture Follow-up  Culture report reviewed by antimicrobial stewardship pharmacist: [facility] []  Enzo Bi, Pharm.D. []  Celedonio Miyamoto, Pharm.D., BCPS AQ-ID []  Garvin Fila, Pharm.D., BCPS []  Georgina Pillion, Pharm.D., BCPS []  [place], [address].D., BCPS, AAHIVP []  Estella Husk, Pharm.D., BCPS, AAHIVP []  Lysle Pearl, PharmD, BCPS []  Phillips Climes, PharmD, BCPS []  Agapito Games, PharmD, BCPS []  Verlan Friends, PharmD []  Mervyn Gay, PharmD, BCPS [x] Angelia Mould, PharmD  [facility] []  Len Childs, PharmD []  Greer Pickerel, PharmD []  Adalberto Cole, PharmD []  Perlie Gold, Rph []  Lonell Face) Jean Rosenthal, PharmD []  Earl Many, PharmD []  Junita Push, PharmD []  Dorna Leitz, PharmD []  Terrilee Files, PharmD []  Lynann Beaver, PharmD []  Keturah Barre, PharmD []  Loralee Pacas, PharmD []  Bernadene Person, PharmD   Positive urine culture Treated with Fosfomycin in ED and no further patient follow-up is required at this time.  [name] 02/27/2023, 12:21 PM

## 2023-03-08 ENCOUNTER — Other Ambulatory Visit (HOSPITAL_COMMUNITY): Payer: Self-pay

## 2023-03-15 ENCOUNTER — Other Ambulatory Visit (HOSPITAL_COMMUNITY): Payer: Self-pay

## 2023-03-19 ENCOUNTER — Emergency Department (HOSPITAL_COMMUNITY)
Admission: EM | Admit: 2023-03-19 | Discharge: 2023-03-19 | Disposition: A | Discharge status: Home / Self Care | Payer: Medicare Other | Attending: Emergency Medicine | Admitting: Emergency Medicine

## 2023-03-19 ENCOUNTER — Other Ambulatory Visit: Payer: Self-pay

## 2023-03-19 ENCOUNTER — Emergency Department (HOSPITAL_COMMUNITY): Payer: Medicare Other

## 2023-03-19 ENCOUNTER — Encounter (HOSPITAL_COMMUNITY): Payer: Self-pay | Admitting: *Deleted

## 2023-03-19 DIAGNOSIS — J9811 Atelectasis: Secondary | ICD-10-CM | POA: Diagnosis not present

## 2023-03-19 DIAGNOSIS — F0390 Unspecified dementia without behavioral disturbance: Secondary | ICD-10-CM | POA: Diagnosis not present

## 2023-03-19 DIAGNOSIS — R079 Chest pain, unspecified: Secondary | ICD-10-CM | POA: Diagnosis not present

## 2023-03-19 DIAGNOSIS — W19XXXA Unspecified fall, initial encounter: Secondary | ICD-10-CM | POA: Diagnosis not present

## 2023-03-19 DIAGNOSIS — S0031XA Abrasion of nose, initial encounter: Secondary | ICD-10-CM | POA: Diagnosis not present

## 2023-03-19 DIAGNOSIS — M25511 Pain in right shoulder: Secondary | ICD-10-CM | POA: Diagnosis not present

## 2023-03-19 DIAGNOSIS — J449 Chronic obstructive pulmonary disease, unspecified: Secondary | ICD-10-CM | POA: Diagnosis not present

## 2023-03-19 DIAGNOSIS — S0990XA Unspecified injury of head, initial encounter: Secondary | ICD-10-CM | POA: Diagnosis not present

## 2023-03-19 DIAGNOSIS — Z96641 Presence of right artificial hip joint: Secondary | ICD-10-CM | POA: Diagnosis not present

## 2023-03-19 DIAGNOSIS — I10 Essential (primary) hypertension: Secondary | ICD-10-CM | POA: Insufficient documentation

## 2023-03-19 DIAGNOSIS — R102 Pelvic and perineal pain: Secondary | ICD-10-CM | POA: Diagnosis not present

## 2023-03-19 DIAGNOSIS — M79601 Pain in right arm: Secondary | ICD-10-CM | POA: Diagnosis not present

## 2023-03-19 DIAGNOSIS — M25569 Pain in unspecified knee: Secondary | ICD-10-CM | POA: Diagnosis not present

## 2023-03-19 DIAGNOSIS — S0992XA Unspecified injury of nose, initial encounter: Secondary | ICD-10-CM | POA: Diagnosis present

## 2023-03-19 DIAGNOSIS — Z7901 Long term (current) use of anticoagulants: Secondary | ICD-10-CM | POA: Insufficient documentation

## 2023-03-19 DIAGNOSIS — R6889 Other general symptoms and signs: Secondary | ICD-10-CM | POA: Diagnosis not present

## 2023-03-19 DIAGNOSIS — Z79899 Other long term (current) drug therapy: Secondary | ICD-10-CM | POA: Insufficient documentation

## 2023-03-19 DIAGNOSIS — R918 Other nonspecific abnormal finding of lung field: Secondary | ICD-10-CM | POA: Diagnosis not present

## 2023-03-19 DIAGNOSIS — G319 Degenerative disease of nervous system, unspecified: Secondary | ICD-10-CM | POA: Diagnosis not present

## 2023-03-19 DIAGNOSIS — S4991XA Unspecified injury of right shoulder and upper arm, initial encounter: Secondary | ICD-10-CM | POA: Diagnosis not present

## 2023-03-19 DIAGNOSIS — M19011 Primary osteoarthritis, right shoulder: Secondary | ICD-10-CM | POA: Diagnosis not present

## 2023-03-19 DIAGNOSIS — S199XXA Unspecified injury of neck, initial encounter: Secondary | ICD-10-CM | POA: Diagnosis not present

## 2023-03-19 LAB — CBC WITH DIFFERENTIAL/PLATELET
Abs Immature Granulocytes: 0.05 10*3/uL (ref 0.00–0.07)
Basophils Absolute: 0 10*3/uL (ref 0.0–0.1)
Basophils Relative: 0 %
Eosinophils Absolute: 0.1 10*3/uL (ref 0.0–0.5)
Eosinophils Relative: 1 %
HCT: 34.6 % — ABNORMAL LOW (ref 36.0–46.0)
Hemoglobin: 11.6 g/dL — ABNORMAL LOW (ref 12.0–15.0)
Immature Granulocytes: 1 %
Lymphocytes Relative: 11 %
Lymphs Abs: 1 10*3/uL (ref 0.7–4.0)
MCH: 33.9 pg (ref 26.0–34.0)
MCHC: 33.5 g/dL (ref 30.0–36.0)
MCV: 101.2 fL — ABNORMAL HIGH (ref 80.0–100.0)
Monocytes Absolute: 0.6 10*3/uL (ref 0.1–1.0)
Monocytes Relative: 7 %
Neutro Abs: 7.5 10*3/uL (ref 1.7–7.7)
Neutrophils Relative %: 80 %
Platelets: 129 10*3/uL — ABNORMAL LOW (ref 150–400)
RBC: 3.42 MIL/uL — ABNORMAL LOW (ref 3.87–5.11)
RDW: 13.6 % (ref 11.5–15.5)
WBC: 9.2 10*3/uL (ref 4.0–10.5)
nRBC: 0 % (ref 0.0–0.2)

## 2023-03-19 LAB — URINALYSIS, ROUTINE W REFLEX MICROSCOPIC
Bacteria, UA: NONE SEEN
Bilirubin Urine: NEGATIVE
Glucose, UA: NEGATIVE mg/dL
Hgb urine dipstick: NEGATIVE
Ketones, ur: NEGATIVE mg/dL
Nitrite: NEGATIVE
Protein, ur: NEGATIVE mg/dL
Specific Gravity, Urine: 1.012 (ref 1.005–1.030)
pH: 7 (ref 5.0–8.0)

## 2023-03-19 LAB — COMPREHENSIVE METABOLIC PANEL
ALT: 12 U/L (ref 0–44)
AST: 20 U/L (ref 15–41)
Albumin: 3.6 g/dL (ref 3.5–5.0)
Alkaline Phosphatase: 55 U/L (ref 38–126)
Anion gap: 13 (ref 5–15)
BUN: 22 mg/dL (ref 8–23)
CO2: 22 mmol/L (ref 22–32)
Calcium: 9.1 mg/dL (ref 8.9–10.3)
Chloride: 104 mmol/L (ref 98–111)
Creatinine, Ser: 1.32 mg/dL — ABNORMAL HIGH (ref 0.44–1.00)
GFR, Estimated: 39 mL/min — ABNORMAL LOW (ref >60)
Glucose, Bld: 122 mg/dL — ABNORMAL HIGH (ref 70–99)
Potassium: 3.5 mmol/L (ref 3.5–5.1)
Sodium: 139 mmol/L (ref 135–145)
Total Bilirubin: 0.7 mg/dL (ref 0.0–1.2)
Total Protein: 5.7 g/dL — ABNORMAL LOW (ref 6.5–8.1)

## 2023-03-19 NOTE — Discharge Instructions (Addendum)
You were seen today for a fall.  The CT scans did not show any injuries and the lab work was reassuring.  I recommend you follow closely with your primary care doctor.  If you develop severe pain or any other new concerning symptoms you should return to the ED.

## 2023-03-19 NOTE — ED Notes (Signed)
Ambulated patient in the hallway one assist.

## 2023-03-19 NOTE — ED Notes (Signed)
Patient transported to CT 

## 2023-03-19 NOTE — ED Triage Notes (Signed)
Patient presents to ed via [facility]  from home ems states patient had wondered outside unsure what time and son found her lying on the ground. Small abrasion to her nose and abrasions to both knees. Patient is alert oriented to name and place only. Doesn't remember why she went outside.

## 2023-03-19 NOTE — ED Provider Notes (Signed)
[name] [facility] AT [facility] Provider Note   CSN: [id number] Arrival date & time: 03/19/23  0740     History  Chief Complaint  Patient presents with   [name] is a 89 y.o. female.  HPI 89 year old female history of atrial fibrillation on Eliquis, your stenosis status post valve replacement, COPD, hypertension, hyperlipidemia, prior stroke, dementia with baseline confusion presenting for possible fall.  Per EMS patient was found outside by her son on the ground.  She had abrasion to her nose.  Unclear if she fell.  Patient notes some pain to her right humerus.  She denies any headache or neck pain or chest pain or belly pain or back pain although history somewhat limited due to dementia.  Son is not at bedside.     Home Medications Prior to Admission medications   Medication Sig Start Date End Date Taking? Authorizing Provider  acetaminophen (TYLENOL) 650 MG CR tablet Take 1 tablet (650 mg total) by mouth 2 (two) times daily. 12/25/21   Sharon Seller, NP  albuterol (VENTOLIN HFA) 108 (90 Base) MCG/ACT inhaler Inhale 2 puffs into the lungs every 6 (six) hours as needed for wheezing or shortness of breath. 05/05/22   Sharon Seller, NP  apixaban (ELIQUIS) 5 MG TABS tablet Take 1 tablet (5 mg total) by mouth 2 (two) times daily. 01/13/23   Runell Gess, MD  Ascorbic Acid (VITAMIN C) 100 MG tablet Take 100 mg by mouth daily.    [provider]  atorvastatin (LIPITOR) 40 MG tablet Take 1 tablet (40 mg total) by mouth daily. 11/19/22   Sharon Seller, NP  Calcium Citrate-Vitamin D (CITRACAL + D PO) Take 1 tablet by mouth daily.    [provider]  diclofenac Sodium (VOLTAREN) 1 % GEL Apply 4 g topically 4 (four) times daily as needed. 12/25/21   Sharon Seller, NP  furosemide (LASIX) 20 MG tablet Take 1 tablet (20 mg total) by mouth daily. 12/24/22   Sharon Seller, NP  lidocaine (LIDODERM) 5 % Place 1  patch onto the skin daily. Remove & Discard patch within 12 hours or as directed by MD 02/24/23   Tegeler, Canary Brim, MD  Magnesium Hydroxide (PHILLIPS MILK OF MAGNESIA PO) Take 15 mLs by mouth at bedtime. Take daily at 6 o'clock at night    [provider]  metoprolol tartrate (LOPRESSOR) 25 MG tablet Take 1 tablet (25 mg total) by mouth 2 (two) times daily. 08/24/22   Runell Gess, MD  mirabegron ER (MYRBETRIQ) 50 MG TB24 tablet Take 1 tablet (50 mg total) by mouth daily. 04/15/22   Sharon Seller, NP  mirtazapine (REMERON) 7.5 MG tablet Take 1 tablet (7.5 mg total) by mouth at bedtime. 07/22/22   Ngetich, [name] C, NP  pantoprazole (PROTONIX) 20 MG tablet Take 1 tablet (20 mg total) by mouth daily. 03/23/22   Netta Corrigan, PA-C  potassium chloride (KLOR-CON M) 10 MEQ tablet Take 1 tablet (10 mEq total) by mouth daily as needed. 11/19/22   Sharon Seller, NP  traMADol (ULTRAM) 50 MG tablet Take 0.5 tablets (25 mg total) by mouth every 6 (six) hours as needed. 02/24/23   Tegeler, Canary Brim, MD  valsartan (DIOVAN) 160 MG tablet Take 1 tablet (160 mg total) by mouth in the morning. 11/19/22   Sharon Seller, NP  valsartan (DIOVAN) 80 MG tablet Take 1 tablet (80 mg total) by  mouth every evening. 03/19/22   Ngetich, [name] C, NP      Allergies    Lisinopril, Ceftriaxone, and Codeine    Review of Systems   Review of Systems Review of systems completed and notable as per HPI.  ROS otherwise negative.  Physical Exam Updated Vital Signs BP (!) 140/65   Pulse 72   Temp 98.7 F (37.1 C) (Oral)   Resp 18   Ht 5\' 3"  (1.6 m)   Wt 68.6 kg   SpO2 100%   BMI 26.79 kg/m  Physical Exam Vitals and nursing note reviewed.  Constitutional:      General: She is not in acute distress.    Appearance: She is well-developed.  HENT:     Head: Normocephalic and atraumatic.     Comments: Small abrasion to the bridge of the nose.  No tenderness.  No nasal septal hematoma.  No  swelling.    Nose: Nose normal.     Mouth/Throat:     Mouth: Mucous membranes are moist.     Pharynx: Oropharynx is clear.  Eyes:     Extraocular Movements: Extraocular movements intact.     Conjunctiva/sclera: Conjunctivae normal.     Pupils: Pupils are equal, round, and reactive to light.  Cardiovascular:     Rate and Rhythm: Normal rate and regular rhythm.     Heart sounds: No murmur heard. Pulmonary:     Effort: Pulmonary effort is normal. No respiratory distress.     Breath sounds: Normal breath sounds.  Abdominal:     Palpations: Abdomen is soft.     Tenderness: There is no abdominal tenderness. There is no guarding or rebound.  Musculoskeletal:        General: No swelling.     Cervical back: Normal range of motion and neck supple. No rigidity or tenderness.     Right lower leg: No edema.     Left lower leg: No edema.  Skin:    General: Skin is warm and dry.     Capillary Refill: Capillary refill takes less than 2 seconds.  Neurological:     Mental Status: She is alert. Mental status is at baseline.     Cranial Nerves: No cranial nerve deficit.     Sensory: No sensory deficit.     Motor: No weakness.     Comments: Reportedly baseline confusion.  Knows her name, can answer some basic questions.  Psychiatric:        Mood and Affect: Mood normal.     ED Results / Procedures / Treatments   Labs (all labs ordered are listed, but only abnormal results are displayed) Labs Reviewed  COMPREHENSIVE METABOLIC PANEL - Abnormal; Notable for the following components:      Result Value   Glucose, Bld 122 (*)    Creatinine, Ser 1.32 (*)    Total Protein 5.7 (*)    GFR, Estimated 39 (*)    All other components within normal limits  CBC WITH DIFFERENTIAL/PLATELET - Abnormal; Notable for the following components:   RBC 3.42 (*)    Hemoglobin 11.6 (*)    HCT 34.6 (*)    MCV 101.2 (*)    Platelets 129 (*)    All other components within normal limits  URINALYSIS, ROUTINE W  REFLEX MICROSCOPIC - Abnormal; Notable for the following components:   Leukocytes,Ua TRACE (*)    All other components within normal limits    EKG EKG Interpretation Date/Time:  Saturday March 19 2023 07:58:43  EST Ventricular Rate:  86 PR Interval:    QRS Duration:  104 QT Interval:  403 QTC Calculation: 512 R Axis:   -15  Text Interpretation: Atrial fibrillation Borderline left axis deviation Probable anteroseptal infarct, old Prolonged QT interval Confirmed by Fulton Reek [id number]) on 03/19/2023 8:28:46 AM  Radiology DG Shoulder Right Result Date: 03/19/2023 CLINICAL DATA:  89 year old female with history of trauma from a fall complaining of right shoulder pain. EXAM: RIGHT SHOULDER - 2+ VIEW COMPARISON:  No priors. FINDINGS: Three views of the right shoulder demonstrate no definite acute displaced fracture or dislocation. Humeral head is high-riding suggesting underlying rotator cuff pathology. Severe joint space narrowing, subchondral sclerosis, subchondral sclerosis and cyst formation in the glenohumeral joint, indicative of osteoarthritis. IMPRESSION: 1. No acute radiographic abnormality of the right shoulder. 2. Severe osteoarthritis of the right glenohumeral joint. 3. High-riding humeral head suggesting underlying rotator cuff pathology. Electronically Signed   By: Trudie Reed M.D.   On: 03/19/2023 10:49   DG Humerus Right Result Date: 03/19/2023 CLINICAL DATA:  89 year old female with history of trauma from a fall. Right arm pain. EXAM: RIGHT HUMERUS - 2+ VIEW COMPARISON:  No priors. FINDINGS: There is no evidence of fracture or other focal bone lesions. Soft tissues are unremarkable. IMPRESSION: Negative. Electronically Signed   By: Trudie Reed M.D.   On: 03/19/2023 10:48   CT Head Wo Contrast Result Date: 03/19/2023 CLINICAL DATA:  Neck trauma.  Minor head trauma.  Found on ground. EXAM: CT HEAD WITHOUT CONTRAST CT CERVICAL SPINE WITHOUT CONTRAST TECHNIQUE:  Multidetector CT imaging of the head and cervical spine was performed following the standard protocol without intravenous contrast. Multiplanar CT image reconstructions of the cervical spine were also generated. RADIATION DOSE REDUCTION: This exam was performed according to the departmental dose-optimization program which includes automated exposure control, adjustment of the mA and/or kV according to patient size and/or use of iterative reconstruction technique. COMPARISON:  02/23/2023 FINDINGS: CT HEAD FINDINGS Brain: No evidence of acute infarction, hemorrhage, hydrocephalus, extra-axial collection or mass lesion/mass effect. Generalized atrophy. Chronic lacunar infarcts in the bilateral medial thalamus. Small chronic infarct in the superior right cerebellum. Vascular: No hyperdense vessel or unexpected calcification. Skull: Normal. Negative for fracture or focal lesion. Sinuses/Orbits: No acute finding. CT CERVICAL SPINE FINDINGS Alignment: Normal. Skull base and vertebrae: No acute fracture. Subjective generalized osteopenia. Soft tissues and spinal canal: No prevertebral fluid or swelling. No visible canal hematoma. Disc levels:  Generalized degenerative endplate and facet spurring. Upper chest: No visible injury IMPRESSION: No evidence of intracranial or cervical spine injury. Electronically Signed   By: Tiburcio Pea M.D.   On: 03/19/2023 09:35   CT Cervical Spine Wo Contrast Result Date: 03/19/2023 CLINICAL DATA:  Neck trauma.  Minor head trauma.  Found on ground. EXAM: CT HEAD WITHOUT CONTRAST CT CERVICAL SPINE WITHOUT CONTRAST TECHNIQUE: Multidetector CT imaging of the head and cervical spine was performed following the standard protocol without intravenous contrast. Multiplanar CT image reconstructions of the cervical spine were also generated. RADIATION DOSE REDUCTION: This exam was performed according to the departmental dose-optimization program which includes automated exposure control,  adjustment of the mA and/or kV according to patient size and/or use of iterative reconstruction technique. COMPARISON:  02/23/2023 FINDINGS: CT HEAD FINDINGS Brain: No evidence of acute infarction, hemorrhage, hydrocephalus, extra-axial collection or mass lesion/mass effect. Generalized atrophy. Chronic lacunar infarcts in the bilateral medial thalamus. Small chronic infarct in the superior right cerebellum. Vascular: No hyperdense vessel or unexpected calcification.  Skull: Normal. Negative for fracture or focal lesion. Sinuses/Orbits: No acute finding. CT CERVICAL SPINE FINDINGS Alignment: Normal. Skull base and vertebrae: No acute fracture. Subjective generalized osteopenia. Soft tissues and spinal canal: No prevertebral fluid or swelling. No visible canal hematoma. Disc levels:  Generalized degenerative endplate and facet spurring. Upper chest: No visible injury IMPRESSION: No evidence of intracranial or cervical spine injury. Electronically Signed   By: Tiburcio Pea M.D.   On: 03/19/2023 09:35   DG Pelvis Portable Result Date: 03/19/2023 CLINICAL DATA:  Fall.  Pain. EXAM: PORTABLE PELVIS 1-2 VIEWS COMPARISON:  10/15/2010.  CT abdomen and pelvis, 03/23/2022. FINDINGS: No fracture or bone lesion. Right total hip arthroplasty appears well seated and aligned and unchanged. Left hip joint, SI joints and symphysis pubis are normally spaced and aligned. Soft tissues are unremarkable. IMPRESSION: 1. No fracture or acute finding. 2. Well aligned right total hip arthroplasty. Electronically Signed   By: Amie [place] M.D.   On: 03/19/2023 09:02   DG Chest Port 1 View Result Date: 03/19/2023 CLINICAL DATA:  Fall.  Pain. EXAM: PORTABLE CHEST 1 VIEW COMPARISON:  02/23/2023. FINDINGS: Stable enlargement of the cardiac silhouette. Stable changes from prior cardiac surgery and valve replacement. No mediastinal or hilar masses. Mild opacity at the right lung base suspect to be atelectasis. Remainder of the lungs is  clear. No convincing pleural effusion and no pneumothorax. Advanced bilateral glenohumeral joint arthropathic changes. Skeletal structures are grossly intact IMPRESSION: 1. No acute findings. 2. Mild right lung base opacity, suspect atelectasis. Electronically Signed   By: Amie [place] M.D.   On: 03/19/2023 09:00    Procedures Procedures    Medications Ordered in ED Medications - No data to display  ED Course/ Medical Decision Making/ A&P Clinical Course as of 03/19/23 1219  Sat Mar 19, 2023  0931 Son: went outside and found patient on the ground.  She is at her baseline. [JD]    Clinical Course User Index [JD] Laurence Spates, MD                                 Medical Decision Making Amount and/or Complexity of Data Reviewed Labs: ordered. Radiology: ordered.   Medical Decision Making:   DEONNI NASH is a 89 y.o. female who presented to the ED today with possible fall.  Vital signs reviewed.  Per EMS she is at her baseline regarding her confusion.  She lives with her son.  Awaiting collateral history from son.  She has some abrasion to bridge of her nose and presumably had a fall.  Obtain CT head and cervical spine.  No history of syncope as far as we know.   Additional history discussed with patient's family/caregivers.  Patient placed on continuous vitals and telemetry monitoring while in ED which was reviewed periodically.  Reviewed and confirmed nursing documentation for past medical history, family history, social history.  Reassessment and Plan:   Stable anemia and thrombocytopenia.  Baseline renal function.  No acute traumatic injuries on imaging.  She is staying with her son and son feels like he is able to take care of her appropriately.  Recommended trying to change the locks that she is not able to get outside at night.  He will work with her PCP about additional home health care.  Ambulating without difficulty.  Return precautions given.  Slight atelectasis on  chest x-ray, however no cough or shortness of  breath or hypoxia or fever I have low suspicion for pneumonia.    Patient's presentation is most consistent with acute complicated illness / injury requiring diagnostic workup.           Final Clinical Impression(s) / ED Diagnoses Final diagnoses:  Fall, initial encounter    Rx / DC Orders ED Discharge Orders     None         Laurence Spates, MD 03/19/23 1219

## 2023-03-24 ENCOUNTER — Telehealth: Payer: Self-pay | Admitting: *Deleted

## 2023-03-24 NOTE — Progress Notes (Signed)
Transition Care Management Unsuccessful Follow-up Telephone Call  Date of discharge and from where:  [facility] [place]  03/19/2023  Attempts:  1st Attempt  Reason for unsuccessful TCM follow-up call:  Left voice message

## 2023-04-06 ENCOUNTER — Other Ambulatory Visit (HOSPITAL_BASED_OUTPATIENT_CLINIC_OR_DEPARTMENT_OTHER): Payer: Self-pay

## 2023-04-19 ENCOUNTER — Other Ambulatory Visit (HOSPITAL_COMMUNITY): Payer: Self-pay

## 2023-04-19 ENCOUNTER — Other Ambulatory Visit: Payer: Self-pay | Admitting: Nurse Practitioner

## 2023-04-19 DIAGNOSIS — E876 Hypokalemia: Secondary | ICD-10-CM

## 2023-04-19 NOTE — Telephone Encounter (Signed)
Can patient have 90 with 1 refill?

## 2023-04-20 ENCOUNTER — Other Ambulatory Visit (HOSPITAL_COMMUNITY): Payer: Self-pay

## 2023-04-25 ENCOUNTER — Other Ambulatory Visit (HOSPITAL_COMMUNITY): Payer: Self-pay

## 2023-04-27 ENCOUNTER — Encounter: Payer: Medicare Other | Admitting: Adult Health

## 2023-04-27 ENCOUNTER — Other Ambulatory Visit (HOSPITAL_COMMUNITY): Payer: Self-pay

## 2023-04-27 MED ORDER — POTASSIUM CHLORIDE CRYS ER 10 MEQ PO TBCR
10.0000 meq | EXTENDED_RELEASE_TABLET | Freq: Every day | ORAL | 1 refills | Status: DC | PRN
  Filled 2023-04-27: qty 30, 30d supply, fill #0

## 2023-04-27 NOTE — Progress Notes (Signed)
 This encounter was created in error - please disregard.

## 2023-04-29 ENCOUNTER — Other Ambulatory Visit (HOSPITAL_COMMUNITY): Payer: Self-pay

## 2023-05-04 ENCOUNTER — Ambulatory Visit: Admitting: Sports Medicine

## 2023-05-20 ENCOUNTER — Other Ambulatory Visit: Payer: Self-pay | Admitting: Nurse Practitioner

## 2023-05-20 ENCOUNTER — Other Ambulatory Visit (HOSPITAL_COMMUNITY): Payer: Self-pay

## 2023-05-20 DIAGNOSIS — R0609 Other forms of dyspnea: Secondary | ICD-10-CM

## 2023-05-23 ENCOUNTER — Other Ambulatory Visit (HOSPITAL_COMMUNITY): Payer: Self-pay

## 2023-05-23 MED ORDER — FUROSEMIDE 20 MG PO TABS
20.0000 mg | ORAL_TABLET | Freq: Every day | ORAL | 2 refills | Status: DC
  Filled 2023-05-23: qty 30, 30d supply, fill #0
  Filled 2023-06-24: qty 30, 30d supply, fill #1
  Filled 2023-08-05: qty 30, 30d supply, fill #2
  Filled 2023-09-08: qty 3, 1d supply, fill #3

## 2023-05-26 ENCOUNTER — Other Ambulatory Visit (HOSPITAL_COMMUNITY): Payer: Self-pay

## 2023-05-26 ENCOUNTER — Other Ambulatory Visit: Payer: Self-pay | Admitting: Nurse Practitioner

## 2023-05-26 ENCOUNTER — Other Ambulatory Visit: Payer: Self-pay

## 2023-05-26 DIAGNOSIS — Z8673 Personal history of transient ischemic attack (TIA), and cerebral infarction without residual deficits: Secondary | ICD-10-CM

## 2023-05-26 MED ORDER — ATORVASTATIN CALCIUM 40 MG PO TABS
40.0000 mg | ORAL_TABLET | Freq: Every day | ORAL | 1 refills | Status: DC
  Filled 2023-05-26: qty 90, 90d supply, fill #0
  Filled 2023-10-07: qty 90, 90d supply, fill #1

## 2023-06-06 ENCOUNTER — Ambulatory Visit (INDEPENDENT_AMBULATORY_CARE_PROVIDER_SITE_OTHER): Admitting: Nurse Practitioner

## 2023-06-06 ENCOUNTER — Encounter: Payer: Self-pay | Admitting: Nurse Practitioner

## 2023-06-06 ENCOUNTER — Other Ambulatory Visit (HOSPITAL_COMMUNITY): Payer: Self-pay

## 2023-06-06 VITALS — BP 124/64 | HR 61 | Temp 96.7°F | Resp 16 | Ht 63.0 in | Wt 148.8 lb

## 2023-06-06 DIAGNOSIS — Z8673 Personal history of transient ischemic attack (TIA), and cerebral infarction without residual deficits: Secondary | ICD-10-CM

## 2023-06-06 DIAGNOSIS — M19011 Primary osteoarthritis, right shoulder: Secondary | ICD-10-CM | POA: Diagnosis not present

## 2023-06-06 DIAGNOSIS — N1831 Chronic kidney disease, stage 3a: Secondary | ICD-10-CM

## 2023-06-06 DIAGNOSIS — I509 Heart failure, unspecified: Secondary | ICD-10-CM

## 2023-06-06 DIAGNOSIS — R739 Hyperglycemia, unspecified: Secondary | ICD-10-CM

## 2023-06-06 DIAGNOSIS — F320 Major depressive disorder, single episode, mild: Secondary | ICD-10-CM

## 2023-06-06 DIAGNOSIS — D508 Other iron deficiency anemias: Secondary | ICD-10-CM | POA: Diagnosis not present

## 2023-06-06 DIAGNOSIS — N3281 Overactive bladder: Secondary | ICD-10-CM

## 2023-06-06 DIAGNOSIS — I480 Paroxysmal atrial fibrillation: Secondary | ICD-10-CM | POA: Diagnosis not present

## 2023-06-06 DIAGNOSIS — E785 Hyperlipidemia, unspecified: Secondary | ICD-10-CM | POA: Diagnosis not present

## 2023-06-06 DIAGNOSIS — G319 Degenerative disease of nervous system, unspecified: Secondary | ICD-10-CM

## 2023-06-06 MED ORDER — POTASSIUM CHLORIDE CRYS ER 10 MEQ PO TBCR: 5.0000 meq | EXTENDED_RELEASE_TABLET | Freq: Every day | ORAL | Status: DC

## 2023-06-06 MED ORDER — METOPROLOL TARTRATE 25 MG PO TABS
12.5000 mg | ORAL_TABLET | Freq: Two times a day (BID) | ORAL | 1 refills | Status: DC
  Filled 2023-06-06: qty 90, 90d supply, fill #0
  Filled 2023-12-08: qty 90, 90d supply, fill #1

## 2023-06-06 NOTE — Patient Instructions (Addendum)
 Decrease metoprolol to 12.5 mg by mouth twice daily   Tylenol [phone] my mouth every 8 hours as needed pain

## 2023-06-06 NOTE — Progress Notes (Unsigned)
 Careteam: Patient Care Team: Sharon Seller, NP as PCP - General (Geriatric Medicine) Runell Gess, MD as PCP - Cardiology (Cardiology) Cherlyn Roberts, MD as Consulting Physician (Dermatology) Dorena Cookey, MD (Inactive) as Consulting Physician (Gastroenterology) Cyndia Bent, MD (Inactive) as Consulting Physician (General Surgery) Runell Gess, MD as Consulting Physician (Cardiology) Alfredo Martinez, MD as Consulting Physician (Urology) Drema Dallas, DO as Consulting Physician (Neurology)  PLACE OF SERVICE:  [facility]  Advanced Directive information Does Patient Have a Medical Advance Directive?: Yes, Type of Advance Directive: Healthcare Power of Attorney, Does patient want to make changes to medical advance directive?: No - Patient declined  Allergies  Allergen Reactions   Lisinopril Cough   Ceftriaxone Rash   Codeine Rash    All over the body.    Chief Complaint  Patient presents with   Generalized Body Aches    Patient complains of body aches, cough, and fatigue, for 3-4 weeks.     HPI: Patient is a 89 y.o. female who comes in today accompanied by her son with complaints of increased fatigue, excessive sleeping, occasional cough and generalized body aches. She had 2 falls in the last 4 months and was taken to the hospital each time.   She has been complaining to her son about hurting all over, and increased weakness and pain to her right shoulder, especially after her fall in January 2025. Per patient's son, she does not cough every time she drinks and is taking medications regularly, sometimes chewing them up. She is often sleeping about 15 hours a day.  She had an appointment next week for a routine visit but is wanting to be seen today for a routine visit instead.  Her appetite is slightly decreased and she drinks an average amount of water daily. She is drinking a lean protein drink daily. No changes to bowel and bladder function, no  complaints of chest pain, shortness of breath, headache, dizziness, or fever.   Review of Systems:  Review of Systems  Constitutional:  Positive for malaise/fatigue.  HENT: Negative.    Eyes: Negative.   Respiratory:  Positive for cough.   Cardiovascular:  Positive for leg swelling.  Gastrointestinal:  Positive for constipation.  Genitourinary: Negative.   Musculoskeletal:  Positive for falls and joint pain.  Skin: Negative.   Neurological:  Positive for weakness.  Psychiatric/Behavioral:  Positive for memory loss.     Past Medical History:  Diagnosis Date   Aortic stenosis 07/20/2012   Aortic stenosis    Arthritis    Cancer (HCC)    Breast   Carotid bruit 08/06/2008   Doppler - R ECA demonstrates noarrowing w/ elevated velocities consistent w/ >70% diameter reduction; R and L ICAs show no evidence of diameter reduction, significant tortuosity or vascular abnormality;    COPD (chronic obstructive pulmonary disease) (HCC)    Hearing loss    Heart murmur    Hyperlipidemia    Hypertension    dr Allyson Sabal   Osteoporosis    PAF (paroxysmal atrial fibrillation) (HCC) 09/04/2012   PVD (peripheral vascular disease) (HCC) 08/13/2010   R/P MV - normal pattern of perfusion in all regions, EF 76%; no significant wall abnormalities noted; normal perfusion study   Right carotid bruit    high-grade right external carotid artery stenosis by 2 parts ultrasound 2 years ago   S/P aortic valve replacement with bioprosthetic valve 08/30/2012   21 mm [facility] Ease bovine pericardial tissue valve   Stroke (cerebrum) (HCC)  Past Surgical History:  Procedure Laterality Date   ABDOMINAL HYSTERECTOMY     Partial   AORTIC VALVE REPLACEMENT N/A 08/30/2012   Procedure: AORTIC VALVE REPLACEMENT (AVR);  Surgeon: Purcell Nails, MD;  Location: [facility] OR;  Service: Open Heart Surgery;  Laterality: N/A;   BIOPSY SHOULDER Left 10/08/2005   shave biopsy   BREAST LUMPECTOMY  02/25/1997   right   CARDIAC  CATHETERIZATION     CHOLECYSTECTOMY N/A 01/25/2022   Procedure: LAPAROSCOPIC CHOLECYSTECTOMY;  Surgeon: Axel Filler, MD;  Location: [facility] OR;  Service: General;  Laterality: N/A;   COLON SURGERY  1959   COSMETIC SURGERY Left 1997   Breast implant   EYE SURGERY  1997   Cataract surgery   INTRAOPERATIVE TRANSESOPHAGEAL ECHOCARDIOGRAM N/A 08/30/2012   Procedure: INTRAOPERATIVE TRANSESOPHAGEAL ECHOCARDIOGRAM;  Surgeon: Purcell Nails, MD;  Location: [facility] OR;  Service: Open Heart Surgery;  Laterality: N/A;   LEFT AND RIGHT HEART CATHETERIZATION WITH CORONARY ANGIOGRAM N/A 08/14/2012   Procedure: LEFT AND RIGHT HEART CATHETERIZATION WITH CORONARY ANGIOGRAM;  Surgeon: Runell Gess, MD;  Location: [facility] CATH LAB;  Service: Cardiovascular;  Laterality: N/A;   LEFT HEART CATH  08/14/12   Nl cors, AS   MASTECTOMY  09/29/95   left   PARTIAL HYSTERECTOMY     TOTAL HIP ARTHROPLASTY  09/2010   Social History:   reports that she has never smoked. She has never used smokeless tobacco. She reports that she does not drink alcohol and does not use drugs.  Family History  Problem Relation Age of Onset   Cancer Mother        Bladder   Early death Father        Tractor accident   Early death Brother        Tax inspector   Early death Brother        during heart surgery    Medications: Patient's Medications  New Prescriptions   No medications on file  Previous Medications   ACETAMINOPHEN (TYLENOL) 650 MG CR TABLET    Take 1 tablet (650 mg total) by mouth 2 (two) times daily.   ALBUTEROL (VENTOLIN HFA) 108 (90 BASE) MCG/ACT INHALER    Inhale 2 puffs into the lungs every 6 (six) hours as needed for wheezing or shortness of breath.   APIXABAN (ELIQUIS) 5 MG TABS TABLET    Take 1 tablet (5 mg total) by mouth 2 (two) times daily.   ASCORBIC ACID (VITAMIN C) 100 MG TABLET    Take 100 mg by mouth daily.   ATORVASTATIN (LIPITOR) 40 MG TABLET    Take 1 tablet (40 mg total) by mouth daily.   CALCIUM  CITRATE-VITAMIN D (CITRACAL + D PO)    Take 1 tablet by mouth daily.   DICLOFENAC SODIUM (VOLTAREN) 1 % GEL    Apply 4 g topically 4 (four) times daily as needed.   FUROSEMIDE (LASIX) 20 MG TABLET    Take 1 tablet (20 mg total) by mouth daily.   MAGNESIUM HYDROXIDE (PHILLIPS MILK OF MAGNESIA PO)    Take 15 mLs by mouth at bedtime. Take daily at 6 o'clock at night   VALSARTAN (DIOVAN) 160 MG TABLET    Take 1 tablet (160 mg total) by mouth in the morning.  Modified Medications   Modified Medication Previous Medication   METOPROLOL TARTRATE (LOPRESSOR) 25 MG TABLET metoprolol tartrate (LOPRESSOR) 25 MG tablet      Take 0.5 tablets (12.5 mg total) by mouth 2 (two)  times daily.    Take 1 tablet (25 mg total) by mouth 2 (two) times daily.   POTASSIUM CHLORIDE (KLOR-CON M) 10 MEQ TABLET potassium chloride (KLOR-CON M) 10 MEQ tablet      Take 0.5 tablets (5 mEq total) by mouth daily.    Take 1 tablet (10 mEq total) by mouth daily as needed.  Discontinued Medications   LIDOCAINE (LIDODERM) 5 %    Place 1 patch onto the skin daily. Remove & Discard patch within 12 hours or as directed by MD   MIRABEGRON ER (MYRBETRIQ) 50 MG TB24 TABLET    Take 1 tablet (50 mg total) by mouth daily.   MIRTAZAPINE (REMERON) 7.5 MG TABLET    Take 1 tablet (7.5 mg total) by mouth at bedtime.   PANTOPRAZOLE (PROTONIX) 20 MG TABLET    Take 1 tablet (20 mg total) by mouth daily.   TRAMADOL (ULTRAM) 50 MG TABLET    Take 0.5 tablets (25 mg total) by mouth every 6 (six) hours as needed.   VALSARTAN (DIOVAN) 80 MG TABLET    Take 1 tablet (80 mg total) by mouth every evening.    Physical Exam:  Vitals:   06/06/23 1509  BP: 124/64  Pulse: 61  Resp: 16  Temp: (!) 96.7 F (35.9 C)  SpO2: 95%  Weight: 148 lb 12.8 oz (67.5 kg)  Height: 5\' 3"  (1.6 m)   Body mass index is 26.36 kg/m. Wt Readings from Last 3 Encounters:  06/06/23 148 lb 12.8 oz (67.5 kg)  03/19/23 151 lb 3.8 oz (68.6 kg)  08/19/22 151 lb 3.2 oz (68.6 kg)     Physical Exam Vitals reviewed.  Constitutional:      Appearance: She is normal weight.  HENT:     Head: Normocephalic and atraumatic.     Mouth/Throat:     Mouth: Mucous membranes are moist.     Pharynx: Oropharynx is clear.  Eyes:     Conjunctiva/sclera: Conjunctivae normal.  Cardiovascular:     Rate and Rhythm: Normal rate.     Pulses: Normal pulses.  Pulmonary:     Effort: Pulmonary effort is normal.     Breath sounds: Normal breath sounds.  Abdominal:     General: Bowel sounds are normal.     Palpations: Abdomen is soft.  Musculoskeletal:     Right shoulder: Tenderness present.     Cervical back: Neck supple.     Right lower leg: Edema present.     Left lower leg: Edema present.  Skin:    General: Skin is warm and dry.     Coloration: Skin is pale.  Neurological:     Mental Status: She is alert. Mental status is at baseline.     Motor: Weakness present.  Psychiatric:        Behavior: Behavior normal.     Labs reviewed: Basic Metabolic Panel: Recent Labs    08/19/22 1647 02/23/23 1900 02/23/23 1908 03/19/23 0958  NA 145* 141 140 139  K 5.3* 4.4 4.3 3.5  CL 105 107 106 104  CO2 27 24  --  22  GLUCOSE 87 88 82 122*  BUN 14 20 24* 22  CREATININE 1.17* 1.27* 1.30* 1.32*  CALCIUM 9.9 9.1  --  9.1   Liver Function Tests: Recent Labs    07/02/22 1450 02/23/23 1900 03/19/23 0958  AST 17 22 20   ALT 9 13 12   ALKPHOS  --  46 55  BILITOT 1.0 1.1 0.7  PROT  5.8* 5.3* 5.7*  ALBUMIN  --  3.2* 3.6   No results for input(s): "LIPASE", "AMYLASE" in the last 8760 hours. No results for input(s): "AMMONIA" in the last 8760 hours. CBC: Recent Labs    07/02/22 1450 02/23/23 1900 02/23/23 1908 03/19/23 0958  WBC 7.1 6.0  --  9.2  NEUTROABS 4,864  --   --  7.5  HGB 11.7 10.7* 10.5* 11.6*  HCT 34.3* 32.1* 31.0* 34.6*  MCV 98.3 100.9*  --  101.2*  PLT 138* 119*  --  129*   Lipid Panel: Recent Labs    07/02/22 1450  CHOL 113  HDL 57  LDLCALC 41   TRIG 72  CHOLHDL 2.0   TSH: No results for input(s): "TSH" in the last 8760 hours. A1C: Lab Results  Component Value Date   HGBA1C 5.3 06/24/2020     Assessment/Plan  1. Congestive heart failure, unspecified HF chronicity, unspecified heart failure type (HCC) (Primary) -euvolemic at this time.  - Chronic, continue lasix 20 mg tablet daily and metoprolol at a decreased dose - potassium chloride (KLOR-CON M) 10 MEQ tablet; Take 0.5 tablets (5 mEq total) by mouth daily. - metoprolol tartrate (LOPRESSOR) 25 MG tablet; Take 0.5 tablets (12.5 mg total) by mouth 2 (two) times daily.  Dispense: 90 tablet; Refill: 1  2. Chronic kidney disease, stage 3a (HCC) - Stable, continue to drink more water - COMPLETE METABOLIC PANEL WITHOUT GFR to be collected in clinic today  -Encourage proper hydration and to avoid NSAIDS (Aleve, Advil, Motrin, Ibuprofen)   3. Cerebral atrophy (HCC) - At baseline, pt is very forgetful and unable to answer many questions -son helps her at home. Reports she sleeps a lot and has a hard time getting routine meals. Son helps with medication, some medication discrepancies, will get referral for care management to help with resources and pharmacy to help with compliance.   4. History of CVA (cerebrovascular accident) - Chronic, continue Eliquis 5 mg tablet twice daily  5. PAF (paroxysmal atrial fibrillation) (HCC) - Stable, HR 60s today - Continue metoprolol 25 mg tablet, 1/2 tablet in the AM and 1/2 tablet in the PM. - TSH to be collected in clinic today  - Continue Eliquis 5 mg tablet BID, discussed compliance with medication Also with recent falls. Discussed risk of bleeding with falls. Son reports fall are not frequent at this time. Will continue to assess risk vs benefit.   6. Iron deficiency anemia secondary to inadequate dietary iron intake - CBC with Differential/Platelet to be collected in clinic today  - At baseline, continue to monitor.  7.  Dyslipidemia - Lipid panel to be collected in clinic today  - COMPLETE METABOLIC PANEL WITHOUT GFR to be collected in clinic today  - At baseline, continue atorvastatin 40 mg tablet daily  8. Current mild episode of major depressive disorder without prior episode (HCC) - At baseline, continue to monitor for signs and symptoms of depression.  9. Overactive bladder - Stable at this time, Myrbetriq was ordered but patient not taking, medication discontinued. - No complaints at this time, will continue to monitor.   10. Arthritis of right shoulder - At baseline, no acute complaints, will continue to monitor.  - Apply diclofenac gel to right shoulder 4 x a day PRN - Continue Tylenol as needed for pain.   Follow up in 3 months for routine medical management of chronic issues.   Lenord Fellers, RN DNP-AGPCNP Student -I personally was present during the history, physical  exam and medical decision-making activities of this service and have verified that the service and findings are accurately documented in the student's note Noel Henandez K. Biagio Borg [facility] [phone]

## 2023-06-07 LAB — COMPLETE METABOLIC PANEL WITHOUT GFR
AG Ratio: 2.3 (calc) (ref 1.0–2.5)
ALT: 15 U/L (ref 6–29)
AST: 18 U/L (ref 10–35)
Albumin: 4.1 g/dL (ref 3.6–5.1)
Alkaline phosphatase (APISO): 51 U/L (ref 37–153)
BUN/Creatinine Ratio: 19 (calc) (ref 6–22)
BUN: 24 mg/dL (ref 7–25)
CO2: 29 mmol/L (ref 20–32)
Calcium: 9.7 mg/dL (ref 8.6–10.4)
Chloride: 107 mmol/L (ref 98–110)
Creat: 1.24 mg/dL — ABNORMAL HIGH (ref 0.60–0.95)
Globulin: 1.8 g/dL — ABNORMAL LOW (ref 1.9–3.7)
Glucose, Bld: 117 mg/dL — ABNORMAL HIGH (ref 65–99)
Potassium: 4.4 mmol/L (ref 3.5–5.3)
Sodium: 142 mmol/L (ref 135–146)
Total Bilirubin: 0.6 mg/dL (ref 0.2–1.2)
Total Protein: 5.9 g/dL — ABNORMAL LOW (ref 6.1–8.1)

## 2023-06-07 LAB — LIPID PANEL
Cholesterol: 107 mg/dL (ref <200)
HDL: 58 mg/dL (ref >=50)
LDL Cholesterol (Calc): 34 mg/dL
Non-HDL Cholesterol (Calc): 49 mg/dL (ref <130)
Total CHOL/HDL Ratio: 1.8 (calc) (ref <5.0)
Triglycerides: 72 mg/dL (ref <150)

## 2023-06-07 LAB — CBC WITH DIFFERENTIAL/PLATELET
Absolute Lymphocytes: 1670 {cells}/uL (ref 850–3900)
Absolute Monocytes: 360 {cells}/uL (ref 200–950)
Basophils Absolute: 58 {cells}/uL (ref 0–200)
Basophils Relative: 1 %
Eosinophils Absolute: 197 {cells}/uL (ref 15–500)
Eosinophils Relative: 3.4 %
HCT: 34.6 % — ABNORMAL LOW (ref 35.0–45.0)
Hemoglobin: 11.5 g/dL — ABNORMAL LOW (ref 11.7–15.5)
MCH: 33.6 pg — ABNORMAL HIGH (ref 27.0–33.0)
MCHC: 33.2 g/dL (ref 32.0–36.0)
MCV: 101.2 fL — ABNORMAL HIGH (ref 80.0–100.0)
MPV: 10.9 fL (ref 7.5–12.5)
Monocytes Relative: 6.2 %
Neutro Abs: 3515 {cells}/uL (ref 1500–7800)
Neutrophils Relative %: 60.6 %
Platelets: 127 10*3/uL — ABNORMAL LOW (ref 140–400)
RBC: 3.42 10*6/uL — ABNORMAL LOW (ref 3.80–5.10)
RDW: 11.8 % (ref 11.0–15.0)
Total Lymphocyte: 28.8 %
WBC: 5.8 10*3/uL (ref 3.8–10.8)

## 2023-06-07 LAB — TEST AUTHORIZATION

## 2023-06-07 LAB — HEMOGLOBIN A1C
Hgb A1c MFr Bld: 5.4 %{Hb} (ref <5.7)
Mean Plasma Glucose: 108 mg/dL
eAG (mmol/L): 6 mmol/L

## 2023-06-07 LAB — TSH: TSH: 1.05 m[IU]/L (ref 0.40–4.50)

## 2023-06-08 ENCOUNTER — Telehealth: Payer: Self-pay

## 2023-06-08 NOTE — Telephone Encounter (Signed)
 Copied from CRM [id number]. Topic: Clinical - Medical Advice >> Jun 08, 2023  9:41 AM Corin V wrote: Reason for CRM: Patient's son called to review lab work and he asked if Shanda Bumps saw anything that made her concerned about patient having walking pneumonia. He feels she may have it and wanted to know if Shanda Bumps had noticed anything during the exam.  He also wanted to get details about the medications added during the 06/06/23 visit.   Loraine Leriche is aware of providers response however he stated " What about the first part of my message?"  Please advise

## 2023-06-08 NOTE — Telephone Encounter (Signed)
 Nothing was added. Metoprolol was decreased to 12.5 mg by mouth twice daily because he was only giving her 25 mg daily.  Medication was taken off her list because she was not taking. He was to take the current list home and compare with medication she was taking to ensure it was accurate. Also care management referral was placed to help with medication and disease management.

## 2023-06-09 ENCOUNTER — Ambulatory Visit
Admission: RE | Admit: 2023-06-09 | Discharge: 2023-06-09 | Disposition: A | Discharge status: Home / Self Care | Source: Ambulatory Visit | Attending: Family | Admitting: Family

## 2023-06-09 ENCOUNTER — Other Ambulatory Visit (HOSPITAL_COMMUNITY): Payer: Self-pay

## 2023-06-09 ENCOUNTER — Encounter: Payer: Self-pay | Admitting: Family

## 2023-06-09 ENCOUNTER — Ambulatory Visit: Admitting: Family

## 2023-06-09 VITALS — BP 115/87 | HR 58 | Temp 97.3°F | Resp 18 | Ht 63.0 in | Wt 151.8 lb

## 2023-06-09 DIAGNOSIS — N1831 Chronic kidney disease, stage 3a: Secondary | ICD-10-CM

## 2023-06-09 DIAGNOSIS — R059 Cough, unspecified: Secondary | ICD-10-CM | POA: Diagnosis not present

## 2023-06-09 DIAGNOSIS — R739 Hyperglycemia, unspecified: Secondary | ICD-10-CM

## 2023-06-09 DIAGNOSIS — I480 Paroxysmal atrial fibrillation: Secondary | ICD-10-CM

## 2023-06-09 DIAGNOSIS — G8929 Other chronic pain: Secondary | ICD-10-CM

## 2023-06-09 DIAGNOSIS — R52 Pain, unspecified: Secondary | ICD-10-CM | POA: Diagnosis not present

## 2023-06-09 DIAGNOSIS — I509 Heart failure, unspecified: Secondary | ICD-10-CM

## 2023-06-09 DIAGNOSIS — J069 Acute upper respiratory infection, unspecified: Secondary | ICD-10-CM

## 2023-06-09 MED ORDER — DOXYCYCLINE HYCLATE 100 MG PO TABS
100.0000 mg | ORAL_TABLET | Freq: Two times a day (BID) | ORAL | 0 refills | Status: AC
  Filled 2023-06-09: qty 14, 7d supply, fill #0

## 2023-06-09 NOTE — Telephone Encounter (Signed)
 Outgoing call placed to patient, no answer, and voicemail full. We will need to make additional attempts to reach patient.

## 2023-06-09 NOTE — Telephone Encounter (Signed)
 Discussed response with [name] and he stated " She really isn't feeling well today." Patient scheduled to see [name] at 2:40

## 2023-06-09 NOTE — Progress Notes (Signed)
 Provider: Traylen Eckels FNP-C  Sharon Seller, NP  Patient Care Team: Sharon Seller, NP as PCP - General (Geriatric Medicine) Runell Gess, MD as PCP - Cardiology (Cardiology) Cherlyn Roberts, MD as Consulting Physician (Dermatology) Dorena Cookey, MD (Inactive) as Consulting Physician (Gastroenterology) Cyndia Bent, MD (Inactive) as Consulting Physician (General Surgery) Runell Gess, MD as Consulting Physician (Cardiology) Alfredo Martinez, MD as Consulting Physician (Urology) Drema Dallas, DO as Consulting Physician (Neurology)  Extended Emergency Contact Information Primary Emergency Contact: [facility] Phone: [phone] Relation: Son Secondary Emergency Contact: Angelina Sheriff [place] Home Phone: [phone] Mobile Phone: [phone] Relation: Daughter  Code Status:  Full Code  Goals of care: Advanced Directive information    06/06/2023    3:17 PM  Advanced Directives  Does Patient Have a Medical Advance Directive? Yes  Type of Advance Directive Healthcare Power of Attorney  Does patient want to make changes to medical advance directive? No - Patient declined  Copy of Healthcare Power of Attorney in Chart? No - copy requested     Chief Complaint  Patient presents with   Acute Visit     Not feeling well    Discussed the use of AI scribe software for clinical note transcription with the patient, who gave verbal consent to proceed.  History of Present Illness   [name] is a 89 year old female with congestive heart failure and irregular heartbeat who presents with generalized pain and cough. She is accompanied by her son.  She experiences generalized pain described as 'hurting everywhere,' which she manages with Tylenol as needed. The pain is diffuse and impacts her daily activities.  She has a persistent cough, primarily nocturnal, associated with congestion. The cough is described as 'congested' and  similar to that of an 'old cigarette smoker.' She experiences shortness of breath when walking but denies wheezing or chest pain. She uses two pillows at night, though one is quite flat, and does not experience swelling in her legs. She was recently seen by her PCP on 06/06/2023 for similar symptoms.   Her medical history includes congestive heart failure and irregular heartbeat, for which she takes furosemide and a potassium supplement. Her son notes that her legs used to be swollen, but the swelling has reduced with the use of the diuretic. There is no current swelling observed. She experiences shortness of breath with exertion and uses two pillows at night, which may suggest orthopnea.  Recent lab work showed stable hemoglobin levels at 11.5, normal white blood cell count at 5.8, and good cholesterol levels. Her glucose levels were slightly elevated, though her A1c remains at a healthy 5.4. Kidney function tests showed a slight improvement with a creatinine level of 1.24, down from 1.32. She is well-hydrated.   Past Medical History:  Diagnosis Date   Aortic stenosis 07/20/2012   Aortic stenosis    Arthritis    Cancer (HCC)    Breast   Carotid bruit 08/06/2008   Doppler - R ECA demonstrates noarrowing w/ elevated velocities consistent w/ >70% diameter reduction; R and L ICAs show no evidence of diameter reduction, significant tortuosity or vascular abnormality;    COPD (chronic obstructive pulmonary disease) (HCC)    Hearing loss    Heart murmur    Hyperlipidemia    Hypertension    dr Allyson Sabal   Osteoporosis    PAF (paroxysmal atrial fibrillation) (HCC) 09/04/2012   PVD (peripheral vascular disease) (HCC) 08/13/2010   R/P MV -  normal pattern of perfusion in all regions, EF 76%; no significant wall abnormalities noted; normal perfusion study   Right carotid bruit    high-grade right external carotid artery stenosis by 2 parts ultrasound 2 years ago   S/P aortic valve replacement with  bioprosthetic valve 08/30/2012   21 mm [facility] Ease bovine pericardial tissue valve   Stroke (cerebrum) [facility])    Past Surgical History:  Procedure Laterality Date   ABDOMINAL HYSTERECTOMY     Partial   AORTIC VALVE REPLACEMENT N/A 08/30/2012   Procedure: AORTIC VALVE REPLACEMENT (AVR);  Surgeon: Purcell Nails, MD;  Location: [facility] OR;  Service: Open Heart Surgery;  Laterality: N/A;   BIOPSY SHOULDER Left 10/08/2005   shave biopsy   BREAST LUMPECTOMY  02/25/1997   right   CARDIAC CATHETERIZATION     CHOLECYSTECTOMY N/A 01/25/2022   Procedure: LAPAROSCOPIC CHOLECYSTECTOMY;  Surgeon: Axel Filler, MD;  Location: [facility] OR;  Service: General;  Laterality: N/A;   COLON SURGERY  1959   COSMETIC SURGERY Left 1997   Breast implant   EYE SURGERY  1997   Cataract surgery   INTRAOPERATIVE TRANSESOPHAGEAL ECHOCARDIOGRAM N/A 08/30/2012   Procedure: INTRAOPERATIVE TRANSESOPHAGEAL ECHOCARDIOGRAM;  Surgeon: Purcell Nails, MD;  Location: [facility] OR;  Service: Open Heart Surgery;  Laterality: N/A;   LEFT AND RIGHT HEART CATHETERIZATION WITH CORONARY ANGIOGRAM N/A 08/14/2012   Procedure: LEFT AND RIGHT HEART CATHETERIZATION WITH CORONARY ANGIOGRAM;  Surgeon: Runell Gess, MD;  Location: [facility] CATH LAB;  Service: Cardiovascular;  Laterality: N/A;   LEFT HEART CATH  08/14/12   Nl cors, AS   MASTECTOMY  09/29/95   left   PARTIAL HYSTERECTOMY     TOTAL HIP ARTHROPLASTY  09/2010    Allergies  Allergen Reactions   Lisinopril Cough   Ceftriaxone Rash   Codeine Rash    All over the body.    Outpatient Encounter Medications as of 06/09/2023  Medication Sig   acetaminophen (TYLENOL) 650 MG CR tablet Take 1 tablet (650 mg total) by mouth 2 (two) times daily.   albuterol (VENTOLIN HFA) 108 (90 Base) MCG/ACT inhaler Inhale 2 puffs into the lungs every 6 (six) hours as needed for wheezing or shortness of breath.   apixaban (ELIQUIS) 5 MG TABS tablet Take 1 tablet (5 mg total) by mouth 2 (two) times daily.    Ascorbic Acid (VITAMIN C) 100 MG tablet Take 100 mg by mouth daily.   atorvastatin (LIPITOR) 40 MG tablet Take 1 tablet (40 mg total) by mouth daily.   Calcium Citrate-Vitamin D (CITRACAL + D PO) Take 1 tablet by mouth daily.   diclofenac Sodium (VOLTAREN) 1 % GEL Apply 4 g topically 4 (four) times daily as needed.   furosemide (LASIX) 20 MG tablet Take 1 tablet (20 mg total) by mouth daily.   Magnesium Hydroxide (PHILLIPS MILK OF MAGNESIA PO) Take 15 mLs by mouth at bedtime. Take daily at 6 o'clock at night   metoprolol tartrate (LOPRESSOR) 25 MG tablet Take 1/2 tablet (12.5 mg total) by mouth 2 (two) times daily.   potassium chloride (KLOR-CON M) 10 MEQ tablet Take 0.5 tablets (5 mEq total) by mouth daily.   valsartan (DIOVAN) 160 MG tablet Take 1 tablet (160 mg total) by mouth in the morning.   No facility-administered encounter medications on file as of 06/09/2023.    Review of Systems  Constitutional:  Negative for appetite change, chills, fatigue, fever and unexpected weight change.  HENT:  Negative for congestion, dental  problem, ear discharge, ear pain, facial swelling, hearing loss, nosebleeds, postnasal drip, rhinorrhea, sinus pressure, sinus pain, sneezing, sore throat, tinnitus and trouble swallowing.   Eyes:  Negative for pain, discharge, redness, itching and visual disturbance.  Respiratory:  Positive for cough. Negative for chest tightness, shortness of breath and wheezing.        Shortness of breath with exertion  Sleeps on two pillows at night   Cardiovascular:  Negative for chest pain, palpitations and leg swelling.  Gastrointestinal:  Negative for abdominal distention, abdominal pain, blood in stool, constipation, diarrhea, nausea and vomiting.  Endocrine: Negative for cold intolerance, heat intolerance, polydipsia, polyphagia and polyuria.  Genitourinary:  Negative for difficulty urinating, dysuria, flank pain, frequency and urgency.  Musculoskeletal:  Positive for  arthralgias and gait problem. Negative for back pain, joint swelling and myalgias.       Generalized pain   Skin:  Negative for color change, pallor and rash.  Neurological:  Negative for dizziness, syncope, weakness, light-headedness and headaches.  Psychiatric/Behavioral:  Negative for agitation, behavioral problems, confusion, hallucinations and sleep disturbance. The patient is not nervous/anxious.        Memory loss     Immunization History  Administered Date(s) Administered   Fluad Quad(high Dose 65+) 12/20/2018, 11/12/2019, 01/02/2021, 12/25/2021   Influenza,inj,Quad PF,6+ Mos 11/29/2012, 11/27/2013, 11/27/2014, 12/09/2015, 01/11/2017, 12/20/2017   PFIZER(Purple Top)SARS-COV-2 Vaccination 11/26/2019, 12/17/2019, 07/14/2020   Pneumococcal Conjugate-13 05/21/2014   Pneumococcal Polysaccharide-23 01/11/2005, 08/15/2012   Tdap 05/28/2014   Zoster Recombinant(Shingrix) 06/24/2017, 11/02/2017   Zoster, Live 01/14/2009   Pertinent  Health Maintenance Due  Topic Date Due   INFLUENZA VACCINE  09/30/2023   DEXA SCAN  Completed      04/01/2022    2:57 PM 05/07/2022    3:17 PM 06/04/2022    2:46 PM 06/06/2023    3:16 PM 06/09/2023    3:13 PM  Fall Risk  Falls in the past year? 0 1 0 1 1  Was there an injury with Fall? 0 1 0 0 0  Fall Risk Category Calculator 0 2 0 1 1  Patient at Risk for Falls Due to No Fall Risks No Fall Risks No Fall Risks History of fall(s) History of fall(s)  Fall risk Follow up Falls evaluation completed Falls evaluation completed Falls evaluation completed Falls evaluation completed Falls evaluation completed   Functional Status Survey:    Vitals:   06/09/23 1457  BP: 115/87  Pulse: (!) 58  Resp: 18  Temp: (!) 97.3 F (36.3 C)  SpO2: 97%  Weight: 151 lb 12.8 oz (68.9 kg)  Height: 5\' 3"  (1.6 m)   Body mass index is 26.89 kg/m. Physical Exam  GENERAL: Alert, cooperative, well developed, no acute distress HEENT: Normocephalic, normal oropharynx, moist  mucous membranes CHEST: Clear to auscultation bilaterally, no wheezes, rhonchi, or crackles CARDIOVASCULAR: Normal heart rate and rhythm, S1 and S2 normal without murmurs ABDOMEN: Soft, non-tender, non-distended, without organomegaly, normal bowel sounds EXTREMITIES: No cyanosis or edema NEUROLOGICAL: Cranial nerves grossly intact, moves all extremities  without gross motor or sensory deficit.unsteady gait SKIN: No rash,no lesion or erythema   PSYCHIATRY/BEHAVIORAL: Mood stable memory loss     Labs reviewed: Recent Labs    02/23/23 1900 02/23/23 1908 03/19/23 0958 06/06/23 1632  NA 141 140 139 142  K 4.4 4.3 3.5 4.4  CL 107 106 104 107  CO2 24  --  22 29  GLUCOSE 88 82 122* 117*  BUN 20 24* 22 24  CREATININE  1.27* 1.30* 1.32* 1.24*  CALCIUM 9.1  --  9.1 9.7   Recent Labs    02/23/23 1900 03/19/23 0958 06/06/23 1632  AST 22 20 18   ALT 13 12 15   ALKPHOS 46 55  --   BILITOT 1.1 0.7 0.6  PROT 5.3* 5.7* 5.9*  ALBUMIN 3.2* 3.6  --    Recent Labs    07/02/22 1450 02/23/23 1900 02/23/23 1908 03/19/23 0958 06/06/23 1632  WBC 7.1 6.0  --  9.2 5.8  NEUTROABS 4,864  --   --  7.5 3,515  HGB 11.7 10.7* 10.5* 11.6* 11.5*  HCT 34.3* 32.1* 31.0* 34.6* 34.6*  MCV 98.3 100.9*  --  101.2* 101.2*  PLT 138* 119*  --  129* 127*   Lab Results  Component Value Date   TSH 1.05 06/06/2023   Lab Results  Component Value Date   HGBA1C 5.4 06/06/2023   Lab Results  Component Value Date   CHOL 107 06/06/2023   HDL 58 06/06/2023   LDLCALC 34 06/06/2023   TRIG 72 06/06/2023   CHOLHDL 1.8 06/06/2023    Significant Diagnostic Results in last 30 days:  No results found.  Assessment/Plan  Cough with congestion Persistent nocturnal cough with congestion, raising concern for lung involvement related to congestive heart failure. No orthopnea or wheezing, but exertional dyspnea is present. Cough resembles that of a smoker, though infrequent during the day. Chest x-ray is necessary  to assess lung condition and exclude acute issues. - Order chest x-ray to evaluate lung condition and rule out acute issues. - Advise monitoring for new symptoms or worsening cough.  Congestive heart failure Currently managed with furosemide and potassium. Absence of peripheral edema suggests effective diuresis. Use of two pillows at night may indicate orthopnea, though one pillow is flat, suggesting minimal orthopnea. Fluid intake monitoring is essential to prevent overload. - Continue furosemide and potassium as prescribed. - Monitor fluid intake to prevent overload. - Advise on salt restriction to manage symptoms.  Chronic kidney disease Slightly elevated creatinine at 1.24, improved from 1.32. BUN is within normal range. Adequate hydration is recommended, with caution to avoid fluid overload due to heart failure. - Encourage adequate hydration without overconsumption to prevent fluid retention. - Monitor kidney function regularly.  Generalized pain Reports generalized pain without specific localization. Vital signs stable, and lab work shows no infection or anemia. Pain may relate to overall health status and age. Tylenol is recommended for management. - Recommend Tylenol administration in the morning, afternoon, and evening for pain management. - Monitor for changes in pain or new symptoms.  Hyperglycemia Slightly elevated glucose level, possibly due to dietary habits like consuming cookies. A1c is 5.4, indicating good glucose control and no diabetes. Dietary monitoring is important for glucose management. - Monitor dietary intake, particularly sweets, to manage glucose levels. - Consider periodic glucose level monitoring.  General Health Maintenance Recent labs show stable hemoglobin, excellent cholesterol, and good thyroid function. No diabetes, and normal liver function. - Continue current health maintenance practices. - Encourage regular follow-up for routine  screenings.  Follow-up Requires follow-up for chest x-ray results and ongoing management of conditions. - Follow up regarding chest x-ray results. - Continue regular monitoring of chronic conditions.    Family/ staff Communication: Reviewed plan of care with patient verbalized understanding   Labs/tests ordered: None   Next Appointment: Return if symptoms worsen or fail to improve.   Total time: 25 minutes. Greater than 50% of total time spent doing patient education regarding  cough /congestion,CHF,Afib,generalized pain health maintenance including symptom/medication management.   Caesar Bookman, NP

## 2023-06-09 NOTE — Telephone Encounter (Signed)
 No, lungs sounds were normal. O2 normal

## 2023-06-10 ENCOUNTER — Other Ambulatory Visit: Payer: Self-pay | Admitting: Nurse Practitioner

## 2023-06-10 ENCOUNTER — Other Ambulatory Visit (HOSPITAL_COMMUNITY): Payer: Self-pay

## 2023-06-10 DIAGNOSIS — I10 Essential (primary) hypertension: Secondary | ICD-10-CM

## 2023-06-10 MED ORDER — VALSARTAN 160 MG PO TABS
160.0000 mg | ORAL_TABLET | Freq: Every morning | ORAL | 1 refills | Status: DC
  Filled 2023-06-10: qty 90, 90d supply, fill #0
  Filled 2023-10-07: qty 90, 90d supply, fill #1

## 2023-06-12 ENCOUNTER — Other Ambulatory Visit: Payer: Self-pay

## 2023-06-17 ENCOUNTER — Telehealth: Payer: Self-pay | Admitting: Pharmacist

## 2023-06-17 DIAGNOSIS — I10 Essential (primary) hypertension: Secondary | ICD-10-CM

## 2023-06-17 NOTE — Progress Notes (Signed)
   06/17/2023  Patient ID: [name], female   DOB: 21-Sep-1931, 89 y.o.   MRN: [id number]  Patient was called regarding medication adherence as referred by her PCP. Unfortunately, she did not answer the phone. HIPAA compliant message requesting a call back was left on her voicemail.  Plan: Call patient back in 3-5 business days.   Geronimo Krabbe, PharmD, BCACP Clinical Pharmacist [phone]

## 2023-06-20 ENCOUNTER — Ambulatory Visit: Admitting: Nurse Practitioner

## 2023-06-20 ENCOUNTER — Telehealth: Payer: Self-pay | Admitting: Pharmacist

## 2023-06-20 DIAGNOSIS — Z79899 Other long term (current) drug therapy: Secondary | ICD-10-CM

## 2023-06-20 NOTE — Progress Notes (Signed)
   06/20/2023  Patient ID: [name], female   DOB: 09/27/1931, 89 y.o.   MRN: [id number]  Patient was called regarding medication adherence as referred by her PCP. Unfortunately, she did not answer the phone. HIPAA compliant message requesting a call back was left on her voicemail. Today's call was the 2nd unsuccessful phone call.   Plan: Call patient back in 3-5 business days.     Geronimo Krabbe, PharmD, BCACP Clinical Pharmacist [phone]

## 2023-06-21 ENCOUNTER — Telehealth: Payer: Self-pay | Admitting: Pharmacist

## 2023-06-21 DIAGNOSIS — E785 Hyperlipidemia, unspecified: Secondary | ICD-10-CM

## 2023-06-21 NOTE — Progress Notes (Addendum)
   06/21/2023  Patient ID: [name], female   DOB: 29-Aug-1931, 89 y.o.   MRN: [id number]  Patient was called to complete a medication review per PCP. Unfortunately, I have not been able to reach the Patient. When I called the number listed in her chart, the phone rings but a recording picks up that said the voice mailbox is full.  Today's call was the third unsuccessful phone call.  Plan: Close Patient's Pharmacy case. I will gladly reopen upon request or an outreach from the patient.   Geronimo Krabbe, PharmD, BCACP Clinical Pharmacist [phone]

## 2023-06-24 ENCOUNTER — Other Ambulatory Visit (HOSPITAL_COMMUNITY): Payer: Self-pay

## 2023-07-08 ENCOUNTER — Other Ambulatory Visit: Payer: Self-pay | Admitting: Nurse Practitioner

## 2023-07-08 ENCOUNTER — Other Ambulatory Visit (HOSPITAL_COMMUNITY): Payer: Self-pay

## 2023-07-09 ENCOUNTER — Other Ambulatory Visit (HOSPITAL_COMMUNITY): Payer: Self-pay

## 2023-07-11 ENCOUNTER — Other Ambulatory Visit (HOSPITAL_COMMUNITY): Payer: Self-pay

## 2023-07-11 ENCOUNTER — Other Ambulatory Visit: Payer: Self-pay | Admitting: Nurse Practitioner

## 2023-07-11 NOTE — Telephone Encounter (Signed)
 Requested medication is different than what is on active medication list, need to clarify dose and instructions with patient.

## 2023-07-12 ENCOUNTER — Ambulatory Visit: Admitting: Family

## 2023-07-12 ENCOUNTER — Other Ambulatory Visit (HOSPITAL_COMMUNITY): Payer: Self-pay

## 2023-07-12 ENCOUNTER — Other Ambulatory Visit: Payer: Self-pay

## 2023-07-12 DIAGNOSIS — I509 Heart failure, unspecified: Secondary | ICD-10-CM

## 2023-07-12 MED ORDER — POTASSIUM CHLORIDE CRYS ER 10 MEQ PO TBCR
5.0000 meq | EXTENDED_RELEASE_TABLET | Freq: Every day | ORAL | 1 refills | Status: DC
  Filled 2023-07-12: qty 45, 90d supply, fill #0
  Filled 2023-10-14: qty 45, 90d supply, fill #1

## 2023-07-12 NOTE — Telephone Encounter (Signed)
 Left message on voicemail for patient to return call when available. The requested medication has different instructions from what is on active medication list.

## 2023-07-13 ENCOUNTER — Other Ambulatory Visit (HOSPITAL_COMMUNITY): Payer: Self-pay

## 2023-07-16 ENCOUNTER — Encounter (HOSPITAL_COMMUNITY): Payer: Self-pay | Admitting: Emergency Medicine

## 2023-07-16 ENCOUNTER — Emergency Department (HOSPITAL_COMMUNITY)

## 2023-07-16 ENCOUNTER — Emergency Department (HOSPITAL_COMMUNITY)
Admission: EM | Admit: 2023-07-16 | Discharge: 2023-07-17 | Disposition: A | Discharge status: Home / Self Care | Attending: Emergency Medicine | Admitting: Emergency Medicine

## 2023-07-16 DIAGNOSIS — R0902 Hypoxemia: Secondary | ICD-10-CM | POA: Diagnosis not present

## 2023-07-16 DIAGNOSIS — R4182 Altered mental status, unspecified: Secondary | ICD-10-CM | POA: Diagnosis not present

## 2023-07-16 DIAGNOSIS — I4891 Unspecified atrial fibrillation: Secondary | ICD-10-CM | POA: Diagnosis not present

## 2023-07-16 DIAGNOSIS — I10 Essential (primary) hypertension: Secondary | ICD-10-CM | POA: Diagnosis not present

## 2023-07-16 DIAGNOSIS — R410 Disorientation, unspecified: Secondary | ICD-10-CM | POA: Diagnosis not present

## 2023-07-16 DIAGNOSIS — I6782 Cerebral ischemia: Secondary | ICD-10-CM | POA: Diagnosis not present

## 2023-07-16 DIAGNOSIS — J449 Chronic obstructive pulmonary disease, unspecified: Secondary | ICD-10-CM | POA: Diagnosis not present

## 2023-07-16 DIAGNOSIS — I959 Hypotension, unspecified: Secondary | ICD-10-CM | POA: Diagnosis not present

## 2023-07-16 DIAGNOSIS — Z96649 Presence of unspecified artificial hip joint: Secondary | ICD-10-CM | POA: Insufficient documentation

## 2023-07-16 DIAGNOSIS — Z8673 Personal history of transient ischemic attack (TIA), and cerebral infarction without residual deficits: Secondary | ICD-10-CM | POA: Insufficient documentation

## 2023-07-16 DIAGNOSIS — Z853 Personal history of malignant neoplasm of breast: Secondary | ICD-10-CM | POA: Insufficient documentation

## 2023-07-16 DIAGNOSIS — I6523 Occlusion and stenosis of bilateral carotid arteries: Secondary | ICD-10-CM | POA: Diagnosis not present

## 2023-07-16 LAB — COMPREHENSIVE METABOLIC PANEL WITH GFR
ALT: 19 U/L (ref 0–44)
AST: 30 U/L (ref 15–41)
Albumin: 3.6 g/dL (ref 3.5–5.0)
Alkaline Phosphatase: 50 U/L (ref 38–126)
Anion gap: 13 (ref 5–15)
BUN: 26 mg/dL — ABNORMAL HIGH (ref 8–23)
CO2: 21 mmol/L — ABNORMAL LOW (ref 22–32)
Calcium: 9.5 mg/dL (ref 8.9–10.3)
Chloride: 105 mmol/L (ref 98–111)
Creatinine, Ser: 1.4 mg/dL — ABNORMAL HIGH (ref 0.44–1.00)
GFR, Estimated: 36 mL/min — ABNORMAL LOW (ref >60)
Glucose, Bld: 97 mg/dL (ref 70–99)
Potassium: 3.8 mmol/L (ref 3.5–5.1)
Sodium: 139 mmol/L (ref 135–145)
Total Bilirubin: 1.1 mg/dL (ref 0.0–1.2)
Total Protein: 6.1 g/dL — ABNORMAL LOW (ref 6.5–8.1)

## 2023-07-16 LAB — CBC
HCT: 33.3 % — ABNORMAL LOW (ref 36.0–46.0)
Hemoglobin: 11.3 g/dL — ABNORMAL LOW (ref 12.0–15.0)
MCH: 33.5 pg (ref 26.0–34.0)
MCHC: 33.9 g/dL (ref 30.0–36.0)
MCV: 98.8 fL (ref 80.0–100.0)
Platelets: 160 10*3/uL (ref 150–400)
RBC: 3.37 MIL/uL — ABNORMAL LOW (ref 3.87–5.11)
RDW: 13 % (ref 11.5–15.5)
WBC: 6.2 10*3/uL (ref 4.0–10.5)
nRBC: 0 % (ref 0.0–0.2)

## 2023-07-16 NOTE — ED Notes (Signed)
 Patient transported to CT

## 2023-07-16 NOTE — ED Triage Notes (Signed)
 Pt found walking down road to a store , pt lives with son but [place] at the brother has been checking on pt 2 times a day , pt is alert and oriented to placed and self

## 2023-07-16 NOTE — ED Provider Notes (Signed)
 [facility]-EMERGENCY DEPT [facility] Emergency Department Provider Note MRN:  [id number]  Arrival date & time: 07/17/23     Chief Complaint   Altered Mental Status   History of Present Illness   [name] is a 89 y.o. year-old female with a history of stroke with COPD presenting to the ED with chief complaint of altered mental status.  Worsening memory over the past several months.  Patient's son lives with her but left town today.  Patient was found on the side of the road, says that she was walking to a store.  Review of Systems  A thorough review of systems was obtained and all systems are negative except as noted in the HPI and PMH.   Patient's Health History    Past Medical History:  Diagnosis Date   Aortic stenosis 07/20/2012   Aortic stenosis    Arthritis    Cancer (HCC)    Breast   Carotid bruit 08/06/2008   Doppler - R ECA demonstrates noarrowing w/ elevated velocities consistent w/ >70% diameter reduction; R and L ICAs show no evidence of diameter reduction, significant tortuosity or vascular abnormality;    COPD (chronic obstructive pulmonary disease) (HCC)    Hearing loss    Heart murmur    Hyperlipidemia    Hypertension    dr Katheryne Pane   Osteoporosis    PAF (paroxysmal atrial fibrillation) (HCC) 09/04/2012   PVD (peripheral vascular disease) (HCC) 08/13/2010   R/P MV - normal pattern of perfusion in all regions, EF 76%; no significant wall abnormalities noted; normal perfusion study   Right carotid bruit    high-grade right external carotid artery stenosis by 2 parts ultrasound 2 years ago   S/P aortic valve replacement with bioprosthetic valve 08/30/2012   21 mm [facility] Ease bovine pericardial tissue valve   Stroke (cerebrum) [facility])     Past Surgical History:  Procedure Laterality Date   ABDOMINAL HYSTERECTOMY     Partial   AORTIC VALVE REPLACEMENT N/A 08/30/2012   Procedure: AORTIC VALVE REPLACEMENT (AVR);  Surgeon: Gardenia Jump, MD;  Location:  [facility] OR;  Service: Open Heart Surgery;  Laterality: N/A;   BIOPSY SHOULDER Left 10/08/2005   shave biopsy   BREAST LUMPECTOMY  02/25/1997   right   CARDIAC CATHETERIZATION     CHOLECYSTECTOMY N/A 01/25/2022   Procedure: LAPAROSCOPIC CHOLECYSTECTOMY;  Surgeon: Shela Derby, MD;  Location: [facility] OR;  Service: General;  Laterality: N/A;   COLON SURGERY  1959   COSMETIC SURGERY Left 1997   Breast implant   EYE SURGERY  1997   Cataract surgery   INTRAOPERATIVE TRANSESOPHAGEAL ECHOCARDIOGRAM N/A 08/30/2012   Procedure: INTRAOPERATIVE TRANSESOPHAGEAL ECHOCARDIOGRAM;  Surgeon: Gardenia Jump, MD;  Location: [facility] OR;  Service: Open Heart Surgery;  Laterality: N/A;   LEFT AND RIGHT HEART CATHETERIZATION WITH CORONARY ANGIOGRAM N/A 08/14/2012   Procedure: LEFT AND RIGHT HEART CATHETERIZATION WITH CORONARY ANGIOGRAM;  Surgeon: Avanell Leigh, MD;  Location: [facility] CATH LAB;  Service: Cardiovascular;  Laterality: N/A;   LEFT HEART CATH  08/14/12   Nl cors, AS   MASTECTOMY  09/29/95   left   PARTIAL HYSTERECTOMY     TOTAL HIP ARTHROPLASTY  09/2010    Family History  Problem Relation Age of Onset   Cancer Mother        Bladder   Early death Father        Tractor accident   Early death Brother        [employer]  fire   Early death Brother        during heart surgery    Social History   Socioeconomic History   Marital status: Widowed    Spouse name: Not on file   Number of children: 2   Years of education: 9   Highest education level: Not on file  Occupational History   Occupation: retired  Tobacco Use   Smoking status: Never   Smokeless tobacco: Never  Vaping Use   Vaping status: Never Used  Substance and Sexual Activity   Alcohol use: No   Drug use: No   Sexual activity: Never  Other Topics Concern   Not on file  Social History Narrative   Right handed   One story home   Social Drivers of Health   Financial Resource Strain: Low Risk  (05/23/2017)   Overall Financial Resource Strain  (CARDIA)    Difficulty of Paying Living Expenses: Not hard at all  Food Insecurity: No Food Insecurity (06/06/2023)   Hunger Vital Sign    Worried About Running Out of Food in the Last Year: Never true    Ran Out of Food in the Last Year: Never true  Transportation Needs: No Transportation Needs (06/06/2023)   PRAPARE - Administrator, Civil Service (Medical): No    Lack of Transportation (Non-Medical): No  Physical Activity: Insufficiently Active (05/23/2017)   Exercise Vital Sign    Days of Exercise per Week: 7 days    Minutes of Exercise per Session: 10 min  Stress: No Stress Concern Present (05/23/2017)   [employer] of Occupational Health - Occupational Stress Questionnaire    Feeling of Stress : Only a little  Social Connections: Moderately Isolated (06/06/2023)   Social Connection and Isolation Panel [NHANES]    Frequency of Communication with Friends and Family: Three times a week    Frequency of Social Gatherings with Friends and Family: Three times a week    Attends Religious Services: More than 4 times per year    Active Member of Clubs or Organizations: No    Attends Banker Meetings: Never    Marital Status: Widowed  Intimate Partner Violence: Not At Risk (06/06/2023)   Humiliation, Afraid, Rape, and Kick questionnaire    Fear of Current or Ex-Partner: No    Emotionally Abused: No    Physically Abused: No    Sexually Abused: No     Physical Exam   Vitals:   07/16/23 2219 07/16/23 2330  BP: 136/63 (!) 115/96  Pulse: 76 79  Resp: 18 16  Temp: 98.6 F (37 C)   SpO2: 97% 98%    CONSTITUTIONAL: Well-appearing, NAD NEURO/PSYCH:  Alert, oriented to name, moves all extremities equally EYES:  eyes equal and reactive ENT/NECK:  no LAD, no JVD CARDIO: Regular rate, well-perfused, normal S1 and S2 PULM:  CTAB no wheezing or rhonchi GI/GU:  non-distended, non-tender MSK/SPINE:  No gross deformities, no edema SKIN:  no rash,  atraumatic   *Additional and/or pertinent findings included in MDM below  Diagnostic and Interventional Summary    EKG Interpretation Date/Time:  Saturday Jul 16 2023 22:24:08 EDT Ventricular Rate:  74 PR Interval:    QRS Duration:  94 QT Interval:  414 QTC Calculation: 459 R Axis:   -10  Text Interpretation: Atrial fibrillation Septal infarct , age undetermined Abnormal ECG When compared with ECG of 19-Mar-2023 07:58, PREVIOUS ECG IS PRESENT Confirmed by Gwenetta Lennert [id number]) on 07/16/2023 11:24:09 PM  Labs Reviewed  COMPREHENSIVE METABOLIC PANEL WITH GFR - Abnormal; Notable for the following components:      Result Value   CO2 21 (*)    BUN 26 (*)    Creatinine, Ser 1.40 (*)    Total Protein 6.1 (*)    GFR, Estimated 36 (*)    All other components within normal limits  CBC - Abnormal; Notable for the following components:   RBC 3.37 (*)    Hemoglobin 11.3 (*)    HCT 33.3 (*)    All other components within normal limits  URINALYSIS, ROUTINE W REFLEX MICROSCOPIC - Abnormal; Notable for the following components:   Ketones, ur 5 (*)    Leukocytes,Ua SMALL (*)    All other components within normal limits    CT HEAD WO CONTRAST ( )  Final Result      Medications - No data to display   Procedures  /  Critical Care Procedures  ED Course and Medical Decision Making  Initial Impression and Ddx The gradual nature of patient's memory issues would suggest dementia.  She is reportedly at her baseline currently according to her spouse and son.  She has done this before where she gets a bit confused and starts wandering outside.  Normally her son watches her at all times but he tried to do something out of town [employer]arry manager.  Will initiate workup for possible confusion to check for electrolyte disturbance, anemia, UTI, intracranial mass.  Past medical/surgical history that increases complexity of ED encounter: Stroke  Interpretation of Diagnostics I personally reviewed the  EKG and my interpretation is as follows: A-fib, unchanged  No significant blood count or electrolyte disturbance.  CT head normal, urinalysis normal  Patient Reassessment and Ultimate Disposition/Management     Patient's family comfortable with her going back home, recommend close observation, PCP follow-up.  Patient management required discussion with the following services or consulting groups:  None  Complexity of Problems Addressed Acute illness or injury that poses threat of life of bodily function  Additional Data Reviewed and Analyzed Further history obtained from: Further history from spouse/family member  Additional Factors Impacting ED Encounter Risk Consideration of hospitalization  Merrick Abe. Harless Lien, MD [facility] Emergency Medicine [facility] [email or web address]  Final Clinical Impressions(s) / ED Diagnoses     ICD-10-CM   1. Confusion  R41.0       ED Discharge Orders     None        Discharge Instructions Discussed with and Provided to Patient:     Discharge Instructions      You were evaluated in the Emergency Department and after careful evaluation, we did not find any emergent condition requiring admission or further testing in the hospital.  Your exam/testing today was overall reassuring.  Suspect symptoms are related to dementia.  Recommend close follow-up with your regular doctor, recommend 24-hour observation to ensure safety.  Please return to the Emergency Department if you experience any worsening of your condition.  Thank you for allowing us  to be a part of your care.      Edson Graces, MD 07/17/23 [phone]

## 2023-07-17 DIAGNOSIS — R410 Disorientation, unspecified: Secondary | ICD-10-CM | POA: Diagnosis not present

## 2023-07-17 LAB — URINALYSIS, ROUTINE W REFLEX MICROSCOPIC
Bacteria, UA: NONE SEEN
Bilirubin Urine: NEGATIVE
Glucose, UA: NEGATIVE mg/dL
Hgb urine dipstick: NEGATIVE
Ketones, ur: 5 mg/dL — AB
Nitrite: NEGATIVE
Protein, ur: NEGATIVE mg/dL
Specific Gravity, Urine: 1.009 (ref 1.005–1.030)
pH: 6 (ref 5.0–8.0)

## 2023-07-17 NOTE — ED Notes (Signed)
 Patient given water to help supply urine sample.

## 2023-07-17 NOTE — Discharge Instructions (Signed)
 You were evaluated in the Emergency Department and after careful evaluation, we did not find any emergent condition requiring admission or further testing in the hospital.  Your exam/testing today was overall reassuring.  Suspect symptoms are related to dementia.  Recommend close follow-up with your regular doctor, recommend 24-hour observation to ensure safety.  Please return to the Emergency Department if you experience any worsening of your condition.  Thank you for allowing us  to be a part of your care.

## 2023-07-20 ENCOUNTER — Other Ambulatory Visit (HOSPITAL_COMMUNITY): Payer: Self-pay

## 2023-08-03 ENCOUNTER — Ambulatory Visit: Payer: Medicare Other | Admitting: Family

## 2023-08-05 ENCOUNTER — Other Ambulatory Visit (HOSPITAL_COMMUNITY): Payer: Self-pay

## 2023-09-08 ENCOUNTER — Other Ambulatory Visit (HOSPITAL_COMMUNITY): Payer: Self-pay

## 2023-09-08 ENCOUNTER — Other Ambulatory Visit: Payer: Self-pay | Admitting: Nurse Practitioner

## 2023-09-08 DIAGNOSIS — R0609 Other forms of dyspnea: Secondary | ICD-10-CM

## 2023-09-09 ENCOUNTER — Other Ambulatory Visit (HOSPITAL_COMMUNITY): Payer: Self-pay

## 2023-09-09 ENCOUNTER — Other Ambulatory Visit: Payer: Self-pay | Admitting: Nurse Practitioner

## 2023-09-09 DIAGNOSIS — R0609 Other forms of dyspnea: Secondary | ICD-10-CM

## 2023-09-09 MED ORDER — FUROSEMIDE 20 MG PO TABS
20.0000 mg | ORAL_TABLET | Freq: Every day | ORAL | 1 refills | Status: DC
  Filled 2023-09-09 (×5): qty 90, 90d supply, fill #0
  Filled 2023-12-08: qty 90, 90d supply, fill #1

## 2023-09-12 ENCOUNTER — Other Ambulatory Visit

## 2023-09-19 ENCOUNTER — Encounter: Payer: Self-pay | Admitting: Nurse Practitioner

## 2023-09-19 ENCOUNTER — Ambulatory Visit (INDEPENDENT_AMBULATORY_CARE_PROVIDER_SITE_OTHER): Admitting: Nurse Practitioner

## 2023-09-19 VITALS — BP 110/62 | HR 57 | Temp 97.8°F | Resp 9 | Ht 63.0 in | Wt 142.8 lb

## 2023-09-19 DIAGNOSIS — I509 Heart failure, unspecified: Secondary | ICD-10-CM

## 2023-09-19 DIAGNOSIS — I480 Paroxysmal atrial fibrillation: Secondary | ICD-10-CM

## 2023-09-19 DIAGNOSIS — E785 Hyperlipidemia, unspecified: Secondary | ICD-10-CM

## 2023-09-19 DIAGNOSIS — Z8673 Personal history of transient ischemic attack (TIA), and cerebral infarction without residual deficits: Secondary | ICD-10-CM | POA: Diagnosis not present

## 2023-09-19 DIAGNOSIS — L602 Onychogryphosis: Secondary | ICD-10-CM | POA: Diagnosis not present

## 2023-09-19 DIAGNOSIS — G301 Alzheimer's disease with late onset: Secondary | ICD-10-CM | POA: Diagnosis not present

## 2023-09-19 DIAGNOSIS — N1831 Chronic kidney disease, stage 3a: Secondary | ICD-10-CM | POA: Diagnosis not present

## 2023-09-19 DIAGNOSIS — F02A0 Dementia in other diseases classified elsewhere, mild, without behavioral disturbance, psychotic disturbance, mood disturbance, and anxiety: Secondary | ICD-10-CM | POA: Diagnosis not present

## 2023-09-19 NOTE — Patient Instructions (Addendum)
 [facility] Adult day care center in [place], [place]  Address: [address] [place], [place] [address] Open ? Closes 5:30?PM Phone: [phone]  Call for cardiology appt  [facility] [address] 5th Floor [place], [place] [address] [phone] Locate on Map

## 2023-09-19 NOTE — Progress Notes (Unsigned)
 Careteam: Patient Care Team: [name], NP as PCP - General (Geriatric Medicine) Court Dorn PARAS, MD as PCP - Cardiology (Cardiology) Ivin Kocher, MD as Consulting Physician (Dermatology) Dyane Rush, MD (Inactive) as Consulting Physician (Gastroenterology) Merrilyn Handler, MD (Inactive) as Consulting Physician (General Surgery) Court Dorn PARAS, MD as Consulting Physician (Cardiology) Gaston Hamilton, MD as Consulting Physician (Urology) Skeet Juliene SAUNDERS, DO as Consulting Physician (Neurology)  PLACE OF SERVICE:  [facility] CLINIC  Advanced Directive information    Allergies  Allergen Reactions   Lisinopril  Cough   Ceftriaxone  Rash   Codeine Rash    All over the body.    Chief Complaint  Patient presents with   Medical Management of Chronic Issues    3 month follow up // pt has congestion / coughing during the night.     HPI:  Discussed the use of AI scribe software for clinical note transcription with the patient, who gave verbal consent to proceed.  History of Present Illness ELIZZIE WESTERGARD is a 89 year old female with dementia, CHF,  who presents with a congested cough at night.  She experiences a congested cough at night, similar to a previous episode. The cough is absent during the day and occurs primarily when lying down. She describes it as sounding 'like a little smoker.' Previously, doxycycline  provided minimal relief, and the symptoms resolved about a month later. The cough has recurred over the past couple of weeks.   She has a history of heart failure and is currently taking Lasix  20 mg daily. There is no reported worsening of shortness of breath or leg swelling. She experiences shortness of breath when outside in hot weather, which her son attributes to the high temperatures. She has not followed up with cardiology recently   There is a history of confusion, with an episode in May leading to an emergency room visit. She was found walking down  the road, and the hospital checked her heart, but no stroke was identified.   She is on Eliquis  for atrial fibrillation, with no abnormal bruising or bleeding reported, although she bruises easily.  She experiences some memory loss, with dementia. Her caregiver reports occasional agitation and irritability, which varies day to day. She sometimes struggles with anxiety and depression, particularly due to family members not visiting. Her son notes that she used to experience hallucinations, which have decreased but still occur intermittently.     Review of Systems:  Review of Systems  Constitutional:  Negative for chills, fever and weight loss.  HENT:  Negative for tinnitus.   Respiratory:  Negative for cough, sputum production and shortness of breath.   Cardiovascular:  Negative for chest pain, palpitations and leg swelling.  Gastrointestinal:  Negative for abdominal pain, constipation, diarrhea and heartburn.  Genitourinary:  Negative for dysuria, frequency and urgency.  Musculoskeletal:  Negative for back pain, falls, joint pain and myalgias.  Skin: Negative.   Neurological:  Negative for dizziness and headaches.  Psychiatric/Behavioral:  Negative for depression and memory loss. The patient does not have insomnia.     Past Medical History:  Diagnosis Date   Aortic stenosis 07/20/2012   Aortic stenosis    Arthritis    Cancer (HCC)    Breast   Carotid bruit 08/06/2008   Doppler - R ECA demonstrates noarrowing w/ elevated velocities consistent w/ >70% diameter reduction; R and L ICAs show no evidence of diameter reduction, significant tortuosity or vascular abnormality;    COPD (chronic obstructive pulmonary  disease) (HCC)    Hearing loss    Heart murmur    Hyperlipidemia    Hypertension    dr Court   Osteoporosis    PAF (paroxysmal atrial fibrillation) (HCC) 09/04/2012   PVD (peripheral vascular disease) (HCC) 08/13/2010   R/P MV - normal pattern of perfusion in all regions, EF  76%; no significant wall abnormalities noted; normal perfusion study   Right carotid bruit    high-grade right external carotid artery stenosis by 2 parts ultrasound 2 years ago   S/P aortic valve replacement with bioprosthetic valve 08/30/2012   21 mm [facility] Ease bovine pericardial tissue valve   Stroke (cerebrum) [facility])    Past Surgical History:  Procedure Laterality Date   ABDOMINAL HYSTERECTOMY     Partial   AORTIC VALVE REPLACEMENT N/A 08/30/2012   Procedure: AORTIC VALVE REPLACEMENT (AVR);  Surgeon: Sudie VEAR Laine, MD;  Location: [facility] OR;  Service: Open Heart Surgery;  Laterality: N/A;   BIOPSY SHOULDER Left 10/08/2005   shave biopsy   BREAST LUMPECTOMY  02/25/1997   right   CARDIAC CATHETERIZATION     CHOLECYSTECTOMY N/A 01/25/2022   Procedure: LAPAROSCOPIC CHOLECYSTECTOMY;  Surgeon: Rubin Calamity, MD;  Location: [facility] OR;  Service: General;  Laterality: N/A;   COLON SURGERY  1959   COSMETIC SURGERY Left 1997   Breast implant   EYE SURGERY  1997   Cataract surgery   INTRAOPERATIVE TRANSESOPHAGEAL ECHOCARDIOGRAM N/A 08/30/2012   Procedure: INTRAOPERATIVE TRANSESOPHAGEAL ECHOCARDIOGRAM;  Surgeon: Sudie VEAR Laine, MD;  Location: [facility] OR;  Service: Open Heart Surgery;  Laterality: N/A;   LEFT AND RIGHT HEART CATHETERIZATION WITH CORONARY ANGIOGRAM N/A 08/14/2012   Procedure: LEFT AND RIGHT HEART CATHETERIZATION WITH CORONARY ANGIOGRAM;  Surgeon: Dorn JINNY Court, MD;  Location: [facility] CATH LAB;  Service: Cardiovascular;  Laterality: N/A;   LEFT HEART CATH  08/14/12   Nl cors, AS   MASTECTOMY  09/29/95   left   PARTIAL HYSTERECTOMY     TOTAL HIP ARTHROPLASTY  09/2010   Social History:   reports that she has never smoked. She has never used smokeless tobacco. She reports that she does not drink alcohol and does not use drugs.  Family History  Problem Relation Age of Onset   Cancer Mother        Bladder   Early death Father        Tractor accident   Early death Brother         Tax inspector   Early death Brother        during heart surgery    Medications: Patient's Medications  New Prescriptions   No medications on file  Previous Medications   ACETAMINOPHEN  (TYLENOL ) 650 MG CR TABLET    Take 1 tablet (650 mg total) by mouth 2 (two) times daily.   ALBUTEROL  (VENTOLIN  HFA) 108 (90 BASE) MCG/ACT INHALER    Inhale 2 puffs into the lungs every 6 (six) hours as needed for wheezing or shortness of breath.   APIXABAN  (ELIQUIS ) 5 MG TABS TABLET    Take 1 tablet (5 mg total) by mouth 2 (two) times daily.   ASCORBIC ACID (VITAMIN C) 100 MG TABLET    Take 100 mg by mouth daily.   ATORVASTATIN  (LIPITOR) 40 MG TABLET    Take 1 tablet (40 mg total) by mouth daily.   CALCIUM  CITRATE-VITAMIN D  (CITRACAL + D PO)    Take 1 tablet by mouth daily.   DICLOFENAC  SODIUM (VOLTAREN ) 1 % GEL  Apply 4 g topically 4 (four) times daily as needed.   FUROSEMIDE  (LASIX ) 20 MG TABLET    Take 1 tablet (20 mg total) by mouth daily.   MAGNESIUM  HYDROXIDE (PHILLIPS MILK OF MAGNESIA PO)    Take 15 mLs by mouth at bedtime. Take daily at 6 o'clock at night   METOPROLOL  TARTRATE (LOPRESSOR ) 25 MG TABLET    Take 1/2 tablet (12.5 mg total) by mouth 2 (two) times daily.   POTASSIUM CHLORIDE  (KLOR-CON  M) 10 MEQ TABLET    Take 0.5 tablets (5 mEq total) by mouth daily.   VALSARTAN  (DIOVAN ) 160 MG TABLET    Take 1 tablet (160 mg total) by mouth in the morning.  Modified Medications   No medications on file  Discontinued Medications   No medications on file    Physical Exam:  Vitals:   09/19/23 1526  BP: 110/62  Pulse: (!) 57  Resp: (!) 9  Temp: 97.8 F (36.6 C)  SpO2: 97%  Weight: 142 lb 12.8 oz (64.8 kg)  Height: 5' 3 (1.6 m)   Body mass index is 25.3 kg/m. Wt Readings from Last 3 Encounters:  09/19/23 142 lb 12.8 oz (64.8 kg)  06/09/23 151 lb 12.8 oz (68.9 kg)  06/06/23 148 lb 12.8 oz (67.5 kg)    Physical Exam Constitutional:      General: She is not in acute distress.     Appearance: She is well-developed. She is not diaphoretic.  HENT:     Head: Normocephalic and atraumatic.     Mouth/Throat:     Pharynx: No oropharyngeal exudate.  Eyes:     Conjunctiva/sclera: Conjunctivae normal.     Pupils: Pupils are equal, round, and reactive to light.  Cardiovascular:     Rate and Rhythm: Normal rate and regular rhythm.     Heart sounds: Normal heart sounds.  Pulmonary:     Effort: Pulmonary effort is normal.     Breath sounds: Normal breath sounds.  Abdominal:     General: Bowel sounds are normal.     Palpations: Abdomen is soft.  Musculoskeletal:     Cervical back: Normal range of motion and neck supple.     Right lower leg: No edema.     Left lower leg: No edema.  Feet:     Right foot:     Skin integrity: Dry skin present.     Toenail Condition: Right toenails are abnormally thick and long.     Left foot:     Skin integrity: Dry skin present.     Toenail Condition: Left toenails are abnormally thick and long.     Comments: Significantly overgrown toenails Skin:    General: Skin is warm and dry.  Neurological:     Mental Status: She is alert.  Psychiatric:        Mood and Affect: Mood normal.     Labs reviewed: Basic Metabolic Panel: Recent Labs    03/19/23 0958 06/06/23 1632 07/16/23 2226  NA 139 142 139  K 3.5 4.4 3.8  CL 104 107 105  CO2 22 29 21*  GLUCOSE 122* 117* 97  BUN 22 24 26*  CREATININE 1.32* 1.24* 1.40*  CALCIUM  9.1 9.7 9.5  TSH  --  1.05  --    Liver Function Tests: Recent Labs    02/23/23 1900 03/19/23 0958 06/06/23 1632 07/16/23 2226  AST 22 20 18 30   ALT 13 12 15 19   ALKPHOS 46 55  --  50  BILITOT 1.1 0.7 0.6 1.1  PROT 5.3* 5.7* 5.9* 6.1*  ALBUMIN  3.2* 3.6  --  3.6   No results for input(s): LIPASE, AMYLASE in the last 8760 hours. No results for input(s): AMMONIA in the last 8760 hours. CBC: Recent Labs    03/19/23 0958 06/06/23 1632 07/16/23 2226  WBC 9.2 5.8 6.2  NEUTROABS 7.5 3,515  --    HGB 11.6* 11.5* 11.3*  HCT 34.6* 34.6* 33.3*  MCV 101.2* 101.2* 98.8  PLT 129* 127* 160   Lipid Panel: Recent Labs    06/06/23 1632  CHOL 107  HDL 58  LDLCALC 34  TRIG 72  CHOLHDL 1.8   TSH: Recent Labs    06/06/23 1632  TSH 1.05   A1C: Lab Results  Component Value Date   HGBA1C 5.4 06/06/2023     Assessment/Plan Assessment & Plan   PAF (paroxysmal atrial fibrillation) (HCC) Rate controlled, on metoprolol , continues on eliquis  for anticoagulation  - CBC with Differential/Platelet - CMP  Chronic kidney disease, stage 3a (HCC) -Chronic and stable Encourage proper hydration Follow metabolic panel Avoid nephrotoxic meds (NSAIDS) - CMP  History of CVA (cerebrovascular accident) Continues on eliquis  for anticoagulation Continues on lipitor.    Dyslipidemia Continues on lipitor Monitor lipids yearly - at goal on last labs   CHF Heart failure may contribute to nocturnal cough. No current dyspnea or peripheral edema. Overdue for cardiology follow-up. - Schedule follow-up appointment with cardiologist. - Continue Lasix  20 mg daily. BNP  Dementia Occasional agitation and hallucinations. Depression and anxiety present but not disruptive.  Discussed potential medication but declined at this time,  if symptoms worsen. - Consider medication for depression or anxiety if symptoms become disruptive.  Onychogryphosis Severely overgrown toenails causing discomfort and skin injury.  Consent provided Was able to trim 8 of 10 but also requires more care. - Refer to podiatrist for ongoing toenail care.  General Health Maintenance Interest in senior citizen programs for social interaction. Discussed [employer]. - Provide information on senior citizen daytime programs such as [employer].  Follow-up Requires follow-up appointments for cardiology and podiatry. - Schedule follow-up with cardiologist. - Schedule appointment with  podiatrist.    Harlene POUR. Caro BODILY [facility] [phone]

## 2023-09-20 LAB — COMPREHENSIVE METABOLIC PANEL WITH GFR
AG Ratio: 2.3 (calc) (ref 1.0–2.5)
ALT: 26 U/L (ref 6–29)
AST: 27 U/L (ref 10–35)
Albumin: 3.9 g/dL (ref 3.6–5.1)
Alkaline phosphatase (APISO): 51 U/L (ref 37–153)
BUN/Creatinine Ratio: 14 (calc) (ref 6–22)
BUN: 18 mg/dL (ref 7–25)
CO2: 27 mmol/L (ref 20–32)
Calcium: 9.2 mg/dL (ref 8.6–10.4)
Chloride: 106 mmol/L (ref 98–110)
Creat: 1.33 mg/dL — ABNORMAL HIGH (ref 0.60–0.95)
Globulin: 1.7 g/dL — ABNORMAL LOW (ref 1.9–3.7)
Glucose, Bld: 112 mg/dL — ABNORMAL HIGH (ref 65–99)
Potassium: 3.8 mmol/L (ref 3.5–5.3)
Sodium: 141 mmol/L (ref 135–146)
Total Bilirubin: 0.5 mg/dL (ref 0.2–1.2)
Total Protein: 5.6 g/dL — ABNORMAL LOW (ref 6.1–8.1)
eGFR: 38 mL/min/1.73m2 — ABNORMAL LOW (ref >=60)

## 2023-09-20 LAB — CBC WITH DIFFERENTIAL/PLATELET
Absolute Lymphocytes: 1379 {cells}/uL (ref 850–3900)
Absolute Monocytes: 475 {cells}/uL (ref 200–950)
Basophils Absolute: 40 {cells}/uL (ref 0–200)
Basophils Relative: 0.6 %
Eosinophils Absolute: 99 {cells}/uL (ref 15–500)
Eosinophils Relative: 1.5 %
HCT: 31.3 % — ABNORMAL LOW (ref 35.0–45.0)
Hemoglobin: 10.5 g/dL — ABNORMAL LOW (ref 11.7–15.5)
MCH: 34.8 pg — ABNORMAL HIGH (ref 27.0–33.0)
MCHC: 33.5 g/dL (ref 32.0–36.0)
MCV: 103.6 fL — ABNORMAL HIGH (ref 80.0–100.0)
MPV: 10.6 fL (ref 7.5–12.5)
Monocytes Relative: 7.2 %
Neutro Abs: 4607 {cells}/uL (ref 1500–7800)
Neutrophils Relative %: 69.8 %
Platelets: 119 Thousand/uL — ABNORMAL LOW (ref 140–400)
RBC: 3.02 Million/uL — ABNORMAL LOW (ref 3.80–5.10)
RDW: 12.2 % (ref 11.0–15.0)
Total Lymphocyte: 20.9 %
WBC: 6.6 Thousand/uL (ref 3.8–10.8)

## 2023-09-20 LAB — BRAIN NATRIURETIC PEPTIDE: Brain Natriuretic Peptide: 331 pg/mL — ABNORMAL HIGH (ref <100)

## 2023-09-21 ENCOUNTER — Ambulatory Visit: Payer: Self-pay | Admitting: Nurse Practitioner

## 2023-09-30 ENCOUNTER — Other Ambulatory Visit (HOSPITAL_COMMUNITY): Payer: Self-pay

## 2023-10-06 ENCOUNTER — Ambulatory Visit: Admitting: Podiatry

## 2023-10-06 ENCOUNTER — Encounter: Payer: Self-pay | Admitting: Podiatry

## 2023-10-06 VITALS — Ht 63.0 in | Wt 142.8 lb

## 2023-10-06 DIAGNOSIS — M79674 Pain in right toe(s): Secondary | ICD-10-CM | POA: Diagnosis not present

## 2023-10-06 DIAGNOSIS — M79675 Pain in left toe(s): Secondary | ICD-10-CM | POA: Diagnosis not present

## 2023-10-06 DIAGNOSIS — Z7901 Long term (current) use of anticoagulants: Secondary | ICD-10-CM

## 2023-10-06 DIAGNOSIS — B351 Tinea unguium: Secondary | ICD-10-CM | POA: Diagnosis not present

## 2023-10-06 NOTE — Progress Notes (Signed)
 Subjective:   Patient ID: [name], female   DOB: 89 y.o.   MRN: [id number]   HPI Chief Complaint  Patient presents with   Nail Problem    Pt is here for RFC.   89 year old female presents the office today with her son for concerns of thick, elongated nails that she is not able to trim herself.  No swelling or redness or any drainage.  The nails do cause discomfort but they are quite thick and elongated.  She is on Eliquis . Review of Systems  All other systems reviewed and are negative.  Past Medical History:  Diagnosis Date   Aortic stenosis 07/20/2012   Aortic stenosis    Arthritis    Cancer (HCC)    Breast   Carotid bruit 08/06/2008   Doppler - R ECA demonstrates noarrowing w/ elevated velocities consistent w/ >70% diameter reduction; R and L ICAs show no evidence of diameter reduction, significant tortuosity or vascular abnormality;    COPD (chronic obstructive pulmonary disease) (HCC)    Hearing loss    Heart murmur    Hyperlipidemia    Hypertension    dr Court   Osteoporosis    PAF (paroxysmal atrial fibrillation) (HCC) 09/04/2012   PVD (peripheral vascular disease) (HCC) 08/13/2010   R/P MV - normal pattern of perfusion in all regions, EF 76%; no significant wall abnormalities noted; normal perfusion study   Right carotid bruit    high-grade right external carotid artery stenosis by 2 parts ultrasound 2 years ago   S/P aortic valve replacement with bioprosthetic valve 08/30/2012   21 mm [facility] Ease bovine pericardial tissue valve   Stroke (cerebrum) [facility])     Past Surgical History:  Procedure Laterality Date   ABDOMINAL HYSTERECTOMY     Partial   AORTIC VALVE REPLACEMENT N/A 08/30/2012   Procedure: AORTIC VALVE REPLACEMENT (AVR);  Surgeon: Sudie VEAR Laine, MD;  Location: [facility] OR;  Service: Open Heart Surgery;  Laterality: N/A;   BIOPSY SHOULDER Left 10/08/2005   shave biopsy   BREAST LUMPECTOMY  02/25/1997   right   CARDIAC CATHETERIZATION      CHOLECYSTECTOMY N/A 01/25/2022   Procedure: LAPAROSCOPIC CHOLECYSTECTOMY;  Surgeon: Rubin Calamity, MD;  Location: [facility] OR;  Service: General;  Laterality: N/A;   COLON SURGERY  1959   COSMETIC SURGERY Left 1997   Breast implant   EYE SURGERY  1997   Cataract surgery   INTRAOPERATIVE TRANSESOPHAGEAL ECHOCARDIOGRAM N/A 08/30/2012   Procedure: INTRAOPERATIVE TRANSESOPHAGEAL ECHOCARDIOGRAM;  Surgeon: Sudie VEAR Laine, MD;  Location: [facility] OR;  Service: Open Heart Surgery;  Laterality: N/A;   LEFT AND RIGHT HEART CATHETERIZATION WITH CORONARY ANGIOGRAM N/A 08/14/2012   Procedure: LEFT AND RIGHT HEART CATHETERIZATION WITH CORONARY ANGIOGRAM;  Surgeon: Dorn JINNY Court, MD;  Location: [facility] CATH LAB;  Service: Cardiovascular;  Laterality: N/A;   LEFT HEART CATH  08/14/12   Nl cors, AS   MASTECTOMY  09/29/95   left   PARTIAL HYSTERECTOMY     TOTAL HIP ARTHROPLASTY  09/2010     Current Outpatient Medications:    acetaminophen  (TYLENOL ) 650 MG CR tablet, Take 1 tablet (650 mg total) by mouth 2 (two) times daily., Disp: , Rfl:    albuterol  (VENTOLIN  HFA) 108 (90 Base) MCG/ACT inhaler, Inhale 2 puffs into the lungs every 6 (six) hours as needed for wheezing or shortness of breath., Disp: 6.7 g, Rfl: 0   apixaban  (ELIQUIS ) 5 MG TABS tablet, Take 1 tablet (5 mg total) by  mouth 2 (two) times daily., Disp: 180 tablet, Rfl: 1   Ascorbic Acid (VITAMIN C) 100 MG tablet, Take 100 mg by mouth daily., Disp: , Rfl:    atorvastatin  (LIPITOR) 40 MG tablet, Take 1 tablet (40 mg total) by mouth daily., Disp: 90 tablet, Rfl: 1   Calcium  Citrate-Vitamin D  (CITRACAL + D PO), Take 1 tablet by mouth daily., Disp: , Rfl:    diclofenac  Sodium (VOLTAREN ) 1 % GEL, Apply 4 g topically 4 (four) times daily as needed., Disp: 100 g, Rfl: 1   furosemide  (LASIX ) 20 MG tablet, Take 1 tablet (20 mg total) by mouth daily., Disp: 90 tablet, Rfl: 1   Magnesium  Hydroxide (PHILLIPS MILK OF MAGNESIA PO), Take 15 mLs by mouth at bedtime. Take daily  at 6 o'clock at night, Disp: , Rfl:    metoprolol  tartrate (LOPRESSOR ) 25 MG tablet, Take 1/2 tablet (12.5 mg total) by mouth 2 (two) times daily., Disp: 90 tablet, Rfl: 1   potassium chloride  (KLOR-CON  M) 10 MEQ tablet, Take 0.5 tablets (5 mEq total) by mouth daily., Disp: 45 tablet, Rfl: 1   valsartan  (DIOVAN ) 160 MG tablet, Take 1 tablet (160 mg total) by mouth in the morning., Disp: 90 tablet, Rfl: 1  Allergies  Allergen Reactions   Lisinopril  Cough   Ceftriaxone  Rash   Codeine Rash    All over the body.          Objective:  Physical Exam  General: AAO x3, NAD  Dermatological: Nails are hypertrophic, dystrophic, brittle, discolored, elongated 10. No surrounding redness or drainage. Tenderness nails 1-5 bilaterally. No open lesions or pre-ulcerative lesions are identified today.  Vascular: Dorsalis Pedis artery and Posterior Tibial artery pedal pulses are palpable bilateral with immedate capillary fill time. There is no pain with calf compression, swelling, warmth, erythema.   Neruologic: Grossly intact via light touch bilateral.   Musculoskeletal: No other areas of discomfort.  Gait: Unassisted, Nonantalgic.       Assessment:   Symptomatic onychomycosis     Plan:  -Treatment options discussed including all alternatives, risks, and complications -Etiology of symptoms were discussed -Nails debrided 10 without complications or bleeding. -Daily foot inspection -Follow-up in 3 months or sooner if any problems arise. In the meantime, encouraged to call the office with any questions, concerns, change in symptoms.   Donnice Fees, DPM

## 2023-10-07 ENCOUNTER — Other Ambulatory Visit (HOSPITAL_COMMUNITY): Payer: Self-pay

## 2023-10-14 ENCOUNTER — Other Ambulatory Visit (HOSPITAL_COMMUNITY): Payer: Self-pay

## 2023-12-01 ENCOUNTER — Other Ambulatory Visit (HOSPITAL_COMMUNITY): Payer: Self-pay

## 2023-12-08 ENCOUNTER — Other Ambulatory Visit (HOSPITAL_COMMUNITY): Payer: Self-pay

## 2024-01-05 ENCOUNTER — Encounter: Payer: Self-pay | Admitting: Podiatry

## 2024-01-05 ENCOUNTER — Ambulatory Visit (INDEPENDENT_AMBULATORY_CARE_PROVIDER_SITE_OTHER): Admitting: Podiatry

## 2024-01-05 DIAGNOSIS — M79674 Pain in right toe(s): Secondary | ICD-10-CM | POA: Diagnosis not present

## 2024-01-05 DIAGNOSIS — M79675 Pain in left toe(s): Secondary | ICD-10-CM

## 2024-01-05 DIAGNOSIS — B351 Tinea unguium: Secondary | ICD-10-CM

## 2024-01-05 DIAGNOSIS — Z7901 Long term (current) use of anticoagulants: Secondary | ICD-10-CM | POA: Diagnosis not present

## 2024-01-05 DIAGNOSIS — N1831 Chronic kidney disease, stage 3a: Secondary | ICD-10-CM

## 2024-01-05 NOTE — Progress Notes (Signed)

## 2024-01-12 ENCOUNTER — Other Ambulatory Visit: Payer: Self-pay | Admitting: Nurse Practitioner

## 2024-01-12 ENCOUNTER — Other Ambulatory Visit: Payer: Self-pay

## 2024-01-12 ENCOUNTER — Other Ambulatory Visit (HOSPITAL_COMMUNITY): Payer: Self-pay

## 2024-01-12 DIAGNOSIS — I10 Essential (primary) hypertension: Secondary | ICD-10-CM

## 2024-01-12 MED ORDER — VALSARTAN 160 MG PO TABS
160.0000 mg | ORAL_TABLET | Freq: Every morning | ORAL | 1 refills | Status: DC
  Filled 2024-01-12 (×2): qty 90, 90d supply, fill #0

## 2024-01-19 ENCOUNTER — Other Ambulatory Visit: Payer: Self-pay | Admitting: Nurse Practitioner

## 2024-01-19 ENCOUNTER — Other Ambulatory Visit (HOSPITAL_COMMUNITY): Payer: Self-pay

## 2024-01-19 DIAGNOSIS — I509 Heart failure, unspecified: Secondary | ICD-10-CM

## 2024-01-19 DIAGNOSIS — Z8673 Personal history of transient ischemic attack (TIA), and cerebral infarction without residual deficits: Secondary | ICD-10-CM

## 2024-01-20 ENCOUNTER — Other Ambulatory Visit (HOSPITAL_COMMUNITY): Payer: Self-pay

## 2024-01-20 MED ORDER — ATORVASTATIN CALCIUM 40 MG PO TABS
40.0000 mg | ORAL_TABLET | Freq: Every day | ORAL | 1 refills | Status: AC
  Filled 2024-01-20: qty 90, 90d supply, fill #0

## 2024-01-20 MED ORDER — POTASSIUM CHLORIDE CRYS ER 10 MEQ PO TBCR
5.0000 meq | EXTENDED_RELEASE_TABLET | Freq: Every day | ORAL | 1 refills | Status: AC
  Filled 2024-01-20: qty 45, 90d supply, fill #0

## 2024-02-03 ENCOUNTER — Other Ambulatory Visit: Payer: Self-pay | Admitting: Cardiovascular Disease

## 2024-02-03 ENCOUNTER — Other Ambulatory Visit (HOSPITAL_COMMUNITY): Payer: Self-pay

## 2024-02-03 DIAGNOSIS — I4821 Permanent atrial fibrillation: Secondary | ICD-10-CM

## 2024-02-06 ENCOUNTER — Other Ambulatory Visit (HOSPITAL_COMMUNITY): Payer: Self-pay

## 2024-02-06 MED ORDER — APIXABAN 5 MG PO TABS
5.0000 mg | ORAL_TABLET | Freq: Two times a day (BID) | ORAL | 0 refills | Status: DC
  Filled 2024-02-06: qty 60, 30d supply, fill #0

## 2024-02-06 NOTE — Telephone Encounter (Signed)
 Prescription refill request for Eliquis  received. Indication: a fib Last office visit: overdue, needs appt. Scr: 1.33 epic 09/19/23 Age: 89 Weight: 64 kg  Routing message to scheduling

## 2024-02-09 ENCOUNTER — Other Ambulatory Visit (HOSPITAL_COMMUNITY): Payer: Self-pay

## 2024-03-22 ENCOUNTER — Other Ambulatory Visit: Payer: Self-pay

## 2024-03-22 ENCOUNTER — Other Ambulatory Visit (HOSPITAL_COMMUNITY): Payer: Self-pay

## 2024-03-22 ENCOUNTER — Other Ambulatory Visit: Payer: Self-pay | Admitting: Nurse Practitioner

## 2024-03-22 DIAGNOSIS — R0609 Other forms of dyspnea: Secondary | ICD-10-CM

## 2024-03-22 DIAGNOSIS — I509 Heart failure, unspecified: Secondary | ICD-10-CM

## 2024-03-22 MED ORDER — METOPROLOL TARTRATE 25 MG PO TABS
12.5000 mg | ORAL_TABLET | Freq: Two times a day (BID) | ORAL | 1 refills | Status: DC
  Filled 2024-03-22: qty 90, 90d supply, fill #0

## 2024-03-22 MED ORDER — FUROSEMIDE 20 MG PO TABS
20.0000 mg | ORAL_TABLET | Freq: Every day | ORAL | 1 refills | Status: DC
  Filled 2024-03-22: qty 90, 90d supply, fill #0

## 2024-03-26 ENCOUNTER — Ambulatory Visit: Payer: Self-pay | Admitting: Nurse Practitioner

## 2024-03-27 ENCOUNTER — Emergency Department (HOSPITAL_COMMUNITY)

## 2024-03-27 ENCOUNTER — Observation Stay (HOSPITAL_COMMUNITY)
Admission: EM | Admit: 2024-03-27 | Discharge: 2024-03-30 | Disposition: A | Discharge status: Home / Self Care | Attending: Internal Medicine | Admitting: Internal Medicine

## 2024-03-27 ENCOUNTER — Encounter (HOSPITAL_COMMUNITY): Payer: Self-pay

## 2024-03-27 ENCOUNTER — Other Ambulatory Visit: Payer: Self-pay

## 2024-03-27 DIAGNOSIS — M9701XA Periprosthetic fracture around internal prosthetic right hip joint, initial encounter: Principal | ICD-10-CM | POA: Insufficient documentation

## 2024-03-27 DIAGNOSIS — I10 Essential (primary) hypertension: Secondary | ICD-10-CM | POA: Diagnosis not present

## 2024-03-27 DIAGNOSIS — I4821 Permanent atrial fibrillation: Secondary | ICD-10-CM | POA: Diagnosis not present

## 2024-03-27 DIAGNOSIS — Y92009 Unspecified place in unspecified non-institutional (private) residence as the place of occurrence of the external cause: Secondary | ICD-10-CM | POA: Diagnosis not present

## 2024-03-27 DIAGNOSIS — I480 Paroxysmal atrial fibrillation: Secondary | ICD-10-CM | POA: Diagnosis present

## 2024-03-27 DIAGNOSIS — I4891 Unspecified atrial fibrillation: Secondary | ICD-10-CM | POA: Diagnosis not present

## 2024-03-27 DIAGNOSIS — M1712 Unilateral primary osteoarthritis, left knee: Secondary | ICD-10-CM

## 2024-03-27 DIAGNOSIS — M978XXA Periprosthetic fracture around other internal prosthetic joint, initial encounter: Secondary | ICD-10-CM | POA: Diagnosis not present

## 2024-03-27 DIAGNOSIS — Z96649 Presence of unspecified artificial hip joint: Secondary | ICD-10-CM | POA: Diagnosis not present

## 2024-03-27 DIAGNOSIS — F0390 Unspecified dementia without behavioral disturbance: Secondary | ICD-10-CM | POA: Insufficient documentation

## 2024-03-27 DIAGNOSIS — N1831 Chronic kidney disease, stage 3a: Secondary | ICD-10-CM | POA: Diagnosis not present

## 2024-03-27 DIAGNOSIS — F03918 Unspecified dementia, unspecified severity, with other behavioral disturbance: Secondary | ICD-10-CM | POA: Diagnosis not present

## 2024-03-27 DIAGNOSIS — M25551 Pain in right hip: Secondary | ICD-10-CM | POA: Diagnosis present

## 2024-03-27 DIAGNOSIS — W19XXXA Unspecified fall, initial encounter: Secondary | ICD-10-CM

## 2024-03-27 DIAGNOSIS — W010XXA Fall on same level from slipping, tripping and stumbling without subsequent striking against object, initial encounter: Secondary | ICD-10-CM | POA: Insufficient documentation

## 2024-03-27 DIAGNOSIS — Z79899 Other long term (current) drug therapy: Secondary | ICD-10-CM | POA: Insufficient documentation

## 2024-03-27 DIAGNOSIS — I5030 Unspecified diastolic (congestive) heart failure: Secondary | ICD-10-CM | POA: Diagnosis not present

## 2024-03-27 DIAGNOSIS — N183 Chronic kidney disease, stage 3 unspecified: Secondary | ICD-10-CM | POA: Diagnosis present

## 2024-03-27 DIAGNOSIS — E785 Hyperlipidemia, unspecified: Secondary | ICD-10-CM | POA: Diagnosis present

## 2024-03-27 DIAGNOSIS — Z96643 Presence of artificial hip joint, bilateral: Secondary | ICD-10-CM | POA: Diagnosis not present

## 2024-03-27 DIAGNOSIS — I130 Hypertensive heart and chronic kidney disease with heart failure and stage 1 through stage 4 chronic kidney disease, or unspecified chronic kidney disease: Secondary | ICD-10-CM | POA: Diagnosis not present

## 2024-03-27 DIAGNOSIS — J449 Chronic obstructive pulmonary disease, unspecified: Secondary | ICD-10-CM | POA: Diagnosis not present

## 2024-03-27 DIAGNOSIS — Z853 Personal history of malignant neoplasm of breast: Secondary | ICD-10-CM | POA: Insufficient documentation

## 2024-03-27 DIAGNOSIS — I5032 Chronic diastolic (congestive) heart failure: Secondary | ICD-10-CM

## 2024-03-27 DIAGNOSIS — Z7901 Long term (current) use of anticoagulants: Secondary | ICD-10-CM | POA: Insufficient documentation

## 2024-03-27 DIAGNOSIS — D539 Nutritional anemia, unspecified: Secondary | ICD-10-CM | POA: Insufficient documentation

## 2024-03-27 DIAGNOSIS — Y92019 Unspecified place in single-family (private) house as the place of occurrence of the external cause: Secondary | ICD-10-CM | POA: Insufficient documentation

## 2024-03-27 DIAGNOSIS — R0609 Other forms of dyspnea: Secondary | ICD-10-CM

## 2024-03-27 DIAGNOSIS — Z8673 Personal history of transient ischemic attack (TIA), and cerebral infarction without residual deficits: Secondary | ICD-10-CM

## 2024-03-27 DIAGNOSIS — S72001A Fracture of unspecified part of neck of right femur, initial encounter for closed fracture: Principal | ICD-10-CM

## 2024-03-27 DIAGNOSIS — I509 Heart failure, unspecified: Secondary | ICD-10-CM

## 2024-03-27 LAB — CBC
HCT: 31.1 % — ABNORMAL LOW (ref 36.0–46.0)
Hemoglobin: 10.4 g/dL — ABNORMAL LOW (ref 12.0–15.0)
MCH: 34.2 pg — ABNORMAL HIGH (ref 26.0–34.0)
MCHC: 33.4 g/dL (ref 30.0–36.0)
MCV: 102.3 fL — ABNORMAL HIGH (ref 80.0–100.0)
Platelets: 159 10*3/uL (ref 150–400)
RBC: 3.04 MIL/uL — ABNORMAL LOW (ref 3.87–5.11)
RDW: 13.4 % (ref 11.5–15.5)
WBC: 8.4 10*3/uL (ref 4.0–10.5)
nRBC: 0 % (ref 0.0–0.2)

## 2024-03-27 LAB — I-STAT CHEM 8, ED
BUN: 14 mg/dL (ref 8–23)
Calcium, Ion: 1.13 mmol/L — ABNORMAL LOW (ref 1.15–1.40)
Chloride: 99 mmol/L (ref 98–111)
Creatinine, Ser: 1.2 mg/dL — ABNORMAL HIGH (ref 0.44–1.00)
Glucose, Bld: 132 mg/dL — ABNORMAL HIGH (ref 70–99)
HCT: 30 % — ABNORMAL LOW (ref 36.0–46.0)
Hemoglobin: 10.2 g/dL — ABNORMAL LOW (ref 12.0–15.0)
Potassium: 3.3 mmol/L — ABNORMAL LOW (ref 3.5–5.1)
Sodium: 140 mmol/L (ref 135–145)
TCO2: 26 mmol/L (ref 22–32)

## 2024-03-27 LAB — BASIC METABOLIC PANEL WITH GFR
Anion gap: 13 (ref 5–15)
BUN: 14 mg/dL (ref 8–23)
CO2: 26 mmol/L (ref 22–32)
Calcium: 9.2 mg/dL (ref 8.9–10.3)
Chloride: 101 mmol/L (ref 98–111)
Creatinine, Ser: 1.05 mg/dL — ABNORMAL HIGH (ref 0.44–1.00)
GFR, Estimated: 50 mL/min — ABNORMAL LOW
Glucose, Bld: 140 mg/dL — ABNORMAL HIGH (ref 70–99)
Potassium: 3.5 mmol/L (ref 3.5–5.1)
Sodium: 140 mmol/L (ref 135–145)

## 2024-03-27 LAB — PROTIME-INR
INR: 1.2 (ref 0.8–1.2)
Prothrombin Time: 16.4 s — ABNORMAL HIGH (ref 11.4–15.2)

## 2024-03-27 LAB — TYPE AND SCREEN
ABO/RH(D): O NEG
Antibody Screen: NEGATIVE

## 2024-03-27 LAB — APTT: aPTT: 36 s (ref 24–36)

## 2024-03-27 LAB — VITAMIN B12: Vitamin B-12: 1845 pg/mL — ABNORMAL HIGH (ref 180–914)

## 2024-03-27 MED ORDER — FUROSEMIDE 20 MG PO TABS
20.0000 mg | ORAL_TABLET | Freq: Every day | ORAL | Status: DC
  Administered 2024-03-27 – 2024-03-30 (×4): 20 mg via ORAL
  Filled 2024-03-27 (×4): qty 1

## 2024-03-27 MED ORDER — FENTANYL CITRATE (PF) 50 MCG/ML IJ SOSY
25.0000 ug | PREFILLED_SYRINGE | INTRAMUSCULAR | Status: DC | PRN
  Administered 2024-03-27 (×2): 25 ug via INTRAVENOUS
  Filled 2024-03-27 (×2): qty 1

## 2024-03-27 MED ORDER — ATORVASTATIN CALCIUM 40 MG PO TABS
40.0000 mg | ORAL_TABLET | Freq: Every day | ORAL | Status: DC
  Administered 2024-03-27 – 2024-03-30 (×4): 40 mg via ORAL
  Filled 2024-03-27 (×4): qty 1

## 2024-03-27 MED ORDER — SENNA 8.6 MG PO TABS
1.0000 | ORAL_TABLET | Freq: Two times a day (BID) | ORAL | Status: DC
  Administered 2024-03-27 – 2024-03-30 (×7): 8.6 mg via ORAL
  Filled 2024-03-27 (×7): qty 1

## 2024-03-27 MED ORDER — APIXABAN 5 MG PO TABS
5.0000 mg | ORAL_TABLET | Freq: Two times a day (BID) | ORAL | Status: DC
  Administered 2024-03-27 – 2024-03-30 (×6): 5 mg via ORAL
  Filled 2024-03-27 (×6): qty 1

## 2024-03-27 MED ORDER — MORPHINE SULFATE (PF) 4 MG/ML IV SOLN: 4.0000 mg | Freq: Once | INTRAVENOUS | Status: DC

## 2024-03-27 MED ORDER — HYDROCODONE-ACETAMINOPHEN 5-325 MG PO TABS
1.0000 | ORAL_TABLET | Freq: Four times a day (QID) | ORAL | Status: DC | PRN
  Administered 2024-03-27 – 2024-03-28 (×3): 2 via ORAL
  Filled 2024-03-27 (×3): qty 2

## 2024-03-27 MED ORDER — IRBESARTAN 150 MG PO TABS
150.0000 mg | ORAL_TABLET | Freq: Every day | ORAL | Status: DC
  Administered 2024-03-27 – 2024-03-29 (×3): 150 mg via ORAL
  Filled 2024-03-27 (×4): qty 1

## 2024-03-27 MED ORDER — METOPROLOL TARTRATE 12.5 MG HALF TABLET
12.5000 mg | ORAL_TABLET | Freq: Two times a day (BID) | ORAL | Status: DC
  Administered 2024-03-27 – 2024-03-29 (×3): 12.5 mg via ORAL
  Filled 2024-03-27 (×7): qty 1

## 2024-03-27 MED ORDER — POTASSIUM CHLORIDE CRYS ER 20 MEQ PO TBCR
40.0000 meq | EXTENDED_RELEASE_TABLET | Freq: Once | ORAL | Status: AC
  Administered 2024-03-27: 40 meq via ORAL
  Filled 2024-03-27: qty 2

## 2024-03-27 NOTE — ED Provider Notes (Signed)
 "  EMERGENCY DEPARTMENT AT [facility] Provider Note   CSN: [id number] Arrival date & time: 03/27/24  [id number]     Patient presents with: FOT   [name] is a 89 y.o. female past medical history seen for hypertension, GERD, aortic valve stenosis, A-fib, CKD, on Eliquis  who presents after witnessed fall on thinners.  Patient was getting out of bed to go to the bathroom, tripped and fell.  Does not recall the event.  Right hip pain.  75 mcg fentanyl  prior to arrival.  No other complaints.  She is at her baseline for orientation, oriented to self, but not time, place.   HPI     Prior to Admission medications  Medication Sig Start Date End Date Taking? Authorizing Provider  acetaminophen  (TYLENOL ) 650 MG CR tablet Take 1 tablet (650 mg total) by mouth 2 (two) times daily. 12/25/21   [name], NP  albuterol  (VENTOLIN  HFA) 108 (90 Base) MCG/ACT inhaler Inhale 2 puffs into the lungs every 6 (six) hours as needed for wheezing or shortness of breath. 05/05/22   [name], NP  apixaban  (ELIQUIS ) 5 MG TABS tablet Take 1 tablet (5 mg total) by mouth 2 (two) times daily. 02/06/24   Court Dorn PARAS, MD  Ascorbic Acid (VITAMIN C) 100 MG tablet Take 100 mg by mouth daily.    [provider]  atorvastatin  (LIPITOR) 40 MG tablet Take 1 tablet (40 mg total) by mouth daily. 01/20/24   Eubanks, Jessica K, NP  Calcium  Citrate-Vitamin D  (CITRACAL + D PO) Take 1 tablet by mouth daily.    [provider]  diclofenac  Sodium (VOLTAREN ) 1 % GEL Apply 4 g topically 4 (four) times daily as needed. 12/25/21   [name], NP  furosemide  (LASIX ) 20 MG tablet Take 1 tablet (20 mg total) by mouth daily. 03/22/24   Eubanks, Jessica K, NP  Magnesium  Hydroxide (PHILLIPS MILK OF MAGNESIA PO) Take 15 mLs by mouth at bedtime. Take daily at 6 o'clock at night    [provider]  metoprolol  tartrate (LOPRESSOR ) 25 MG tablet Take 0.5 tablets (12.5 mg total) by  mouth 2 (two) times daily. 03/22/24   Eubanks, Jessica K, NP  potassium chloride  (KLOR-CON  M) 10 MEQ tablet Take 1/2 tablet (5 mEq total) by mouth daily. 01/20/24   [name], NP  valsartan  (DIOVAN ) 160 MG tablet Take 1 tablet (160 mg total) by mouth in the morning. 01/12/24   [name], NP    Allergies: Lisinopril , Ceftriaxone , and Codeine    Review of Systems  All other systems reviewed and are negative.   Updated Vital Signs BP (!) 151/88   Pulse 78   Temp (!) 97.5 F (36.4 C) (Axillary)   Resp 19   Ht 5' 3 (1.6 m)   Wt 64.9 kg   SpO2 98%   BMI 25.33 kg/m   Physical Exam Vitals and nursing note reviewed.  Constitutional:      General: She is not in acute distress.    Appearance: Normal appearance.  HENT:     Head: Normocephalic and atraumatic.     Comments: Large hematoma on right parietal scalp, no active bleeding or laceration Eyes:     General:        Right eye: No discharge.        Left eye: No discharge.  Cardiovascular:     Rate and Rhythm: Normal rate and regular rhythm.     Pulses:  Normal pulses.     Heart sounds: No murmur heard.    No friction rub. No gallop.     Comments: DP, PT pulses 2+ in bilateral lower extremities Pulmonary:     Effort: Pulmonary effort is normal.     Breath sounds: Normal breath sounds.  Abdominal:     General: Bowel sounds are normal.     Palpations: Abdomen is soft.  Musculoskeletal:     Comments: Significant tenderness to palpation of the right hip, tenderness with passive flexion, extension of the right hip.  No obvious leg length discrepancy or rotation.  Skin:    General: Skin is warm and dry.     Capillary Refill: Capillary refill takes less than 2 seconds.  Neurological:     Mental Status: She is alert and oriented to person, place, and time.  Psychiatric:        Mood and Affect: Mood normal.        Behavior: Behavior normal.     (all labs ordered are listed, but only abnormal results are  displayed) Labs Reviewed  CBC - Abnormal; Notable for the following components:      Result Value   RBC 3.04 (*)    Hemoglobin 10.4 (*)    HCT 31.1 (*)    MCV 102.3 (*)    MCH 34.2 (*)    All other components within normal limits  BASIC METABOLIC PANEL WITH GFR - Abnormal; Notable for the following components:   Glucose, Bld 140 (*)    Creatinine, Ser 1.05 (*)    GFR, Estimated 50 (*)    All other components within normal limits  PROTIME-INR - Abnormal; Notable for the following components:   Prothrombin Time 16.4 (*)    All other components within normal limits  I-STAT CHEM 8, ED - Abnormal; Notable for the following components:   Potassium 3.3 (*)    Creatinine, Ser 1.20 (*)    Glucose, Bld 132 (*)    Calcium , Ion 1.13 (*)    Hemoglobin 10.2 (*)    HCT 30.0 (*)    All other components within normal limits  APTT    EKG: EKG Interpretation Date/Time:  Tuesday March 27 2024 06:47:04 EST Ventricular Rate:  59 PR Interval:    QRS Duration:  81 QT Interval:  495 QTC Calculation: 491 R Axis:   -28  Text Interpretation: Atrial fibrillation Left ventricular hypertrophy Anterior Q waves, possibly due to LVH No significant change since last tracing Confirmed by Haze Lonni PARAS [phone]) on 03/27/2024 6:51:33 AM  Radiology: ARCOLA HIP UNILAT WITH PELVIS 1V RIGHT Result Date: 03/27/2024 EXAM: 3 VIEW(S) XRAY OF THE PELVIS AND RIGHT HIP 03/27/2024 07:57:00 AM COMPARISON: Pelvis radiographs 03/27/2024 06:54 AM. CLINICAL HISTORY: 89 year old female. Fall. Proximal right femur periprosthetic fracture. FINDINGS: BONES AND JOINTS: SI joints are symmetric. Stable and intact visible pelvis. Right hip: Mildly comminuted and minimally displaced periprosthetic fracture of the proximal right femur beginning at the distal trochanter level appears stable. Underlying right bipolar hip arthroplasty hardware appears intact and aligned. Left hip: Normal alignment. SOFT TISSUES: Unremarkable.  IMPRESSION: 1. Stable mildly comminuted, minimally displaced proximal right femur periprosthetic fracture. 2. Intact and aligned right bipolar hip arthroplasty hardware. Electronically signed by: Helayne Hurst MD 03/27/2024 08:03 AM EST RP Workstation: [id number]   CT Cervical Spine Wo Contrast Result Date: 03/27/2024 EXAM: CT CERVICAL SPINE WITHOUT CONTRAST 03/27/2024 07:41:00 AM TECHNIQUE: CT of the cervical spine was performed without the administration of intravenous contrast. Multiplanar reformatted  images are provided for review. Automated exposure control, iterative reconstruction, and/or weight based adjustment of the mA/kV was utilized to reduce the radiation dose to as low as reasonably achievable. COMPARISON: CT cervical spine 03/19/2023. CLINICAL HISTORY: Neck trauma. Fall, on blood thinners. FINDINGS: BONES AND ALIGNMENT: Chronic straightening of the normal cervical lordosis. Trace anterolisthesis of C3 on C4 and C7 on T1. Trace retrolisthesis of C5 on C6. No acute fracture or traumatic malalignment. No suspicious bone lesion. DEGENERATIVE CHANGES: Multilevel disc degeneration which is most advanced at C2-C3, C5-C6, and C6-C7. Asymmetric left sided facet arthrosis in the upper and mid cervical spine. Moderately advanced atlantodental degenerative changes. SOFT TISSUES: No prevertebral soft tissue swelling. LUNGS: Biapical pleural parenchymal lung scarring and calcification. IMPRESSION: 1. No acute cervical spine fracture or traumatic malalignment. Electronically signed by: Dasie Hamburg MD 03/27/2024 07:51 AM EST RP Workstation: [id number]   CT Head Wo Contrast Result Date: 03/27/2024 EXAM: CT HEAD WITHOUT CONTRAST 03/27/2024 07:41:00 AM TECHNIQUE: CT of the head was performed without the administration of intravenous contrast. Automated exposure control, iterative reconstruction, and/or weight based adjustment of the mA/kV was utilized to reduce the radiation dose to as low as reasonably achievable.  COMPARISON: Head CT 07/16/2023 and MRI 06/24/2020. CLINICAL HISTORY: Head trauma, minor (Age >= 65 years). Fall, on blood thinners. FINDINGS: BRAIN AND VENTRICLES: There is no evidence of an acute infarct, intracranial hemorrhage, mass, midline shift, hydrocephalus, or extra-axial fluid collection. Mild cerebral atrophy is within normal limits for age. Patchy hypodensities in the cerebral white matter bilaterally are similar to the prior CT and are nonspecific but compatible with moderate chronic small vessel ischemic disease. Chronic bilateral thalamic lacunar infarcts are again noted. Calcified atherosclerosis at the skull base. ORBITS: Bilateral cataract extraction. SINUSES: No acute abnormality. SOFT TISSUES AND SKULL: Moderate sized right parietal scalp hematoma. No skull fracture. IMPRESSION: 1. No acute intracranial abnormality. 2. Right parietal scalp hematoma. 3. Moderate chronic small vessel ischemic disease. Electronically signed by: Dasie Hamburg MD 03/27/2024 07:48 AM EST RP Workstation: [id number]   DG Pelvis Portable Result Date: 03/27/2024 EXAM: 2 VIEW(S) XRAY OF THE PELVIS 03/27/2024 07:02:00 AM COMPARISON: CT abdomen and pelvis 03/23/2022, and pelvis radiograph 03/19/2023. CLINICAL HISTORY: 89 year old female. Right hip pain after a fall on blood thinners. FINDINGS: BONES AND JOINTS: Total right hip arthroplasty in place. Stable AP alignment of right bipolar hip arthroplasty components. Acute, largely nondisplaced periprosthetic fracture along the proximal right femoral shaft. Mild degenerative changes of the left hip joint with mild-to-moderate joint space narrowing and osteophytosis of the superior acetabulum. SOFT TISSUES: Vascular calcifications. IMPRESSION: 1. Acute, largely nondisplaced periprosthetic fracture of the proximal right femur. 2. Stable AP alignment of right bipolar hip arthroplasty components. Electronically signed by: Helayne Hurst MD 03/27/2024 07:07 AM EST RP Workstation:  [id number]   DG Chest Portable 1 View Result Date: 03/27/2024 EXAM: 1 VIEW XRAY OF THE CHEST 03/27/2024 07:02:00 AM COMPARISON: 06/09/2023 CLINICAL HISTORY: 89 year old female. Right hip pain after a fall on blood thinners. FINDINGS: Asymmetric breast attenuation artifact suspected over the left chest. LUNGS AND PLEURA: Biapical pleural thickening with calcification. No focal pulmonary opacity. No pleural effusion. No pneumothorax. HEART AND MEDIASTINUM: Cardiomegaly. Calcified aorta. Prosthetic cardiac valve noted. Sternotomy wires noted. BONES AND SOFT TISSUES: Chronic severe right greater than left glenohumeral degeneration. Bilateral shoulder degenerative joint disease (DJD). Negative visible bowel gas. No acute osseous abnormality. IMPRESSION: 1. No acute cardiopulmonary abnormality. 2. Cardiomegaly with prosthetic cardiac valve. 3. Aortic Atherosclerosis (ICD10-I70.0). Electronically signed by: Helayne Hurst  MD 03/27/2024 07:05 AM EST RP Workstation: [id number]     Procedures   Medications Ordered in the ED  morphine  (PF) 4 MG/ML injection 4 mg (has no administration in time range)                                    Medical Decision Making Amount and/or Complexity of Data Reviewed Labs: ordered. Radiology: ordered.   This patient is a 89 y.o. female  who presents to the ED for concern of fall on thinners.   Differential diagnoses prior to evaluation: The emergent differential diagnosis includes, but is not limited to,  epidural hematoma, subdural hematoma, skull fracture, subarachnoid hemorrhage, unstable cervical spine fracture, concussion vs other MSK injury, fracture, dislocation of hip . This is not an exhaustive differential.   Past Medical History / Co-morbidities / Social History: hypertension, GERD, aortic valve stenosis, A-fib, CKD, on Eliquis   Physical Exam: Physical exam performed. The pertinent findings include: Significant tenderness to palpation of the right hip,  tenderness with passive flexion, extension of the right hip.  No obvious leg length discrepancy or rotation.   Lab Tests/Imaging studies: I personally interpreted labs/imaging and the pertinent results include: CBC overall unremarkable, anemia, hemoglobin 10.4 stable compared to baseline.  BMP overall unremarkable, mildly elevated creatinine at 1.05, normal BUN, overall stable GFR.  Very mildly elevated prothrombin time but normal INR.  Normal APTT.  CT cervical spine, CT head without contrast show large right scalp hematoma, no evidence of head fracture, neck fracture.  Plain film radiograph of the pelvis shows periprosthetic fracture on the right.  Plain film chest x-ray with stable cardiomegaly, but no acute fracture, or other abnormality... I agree with the radiologist interpretation.  Cardiac monitoring: EKG obtained and interpreted by myself and attending physician which shows: A-fib, no significant change from baseline   Medications: I ordered medication including morphine  for pain.  I have reviewed the patients home medicines and have made adjustments as needed.  Consults: I spoke with the hospitalist, Dr. Claudene who agrees to admission for right hip fracture, possible orthopedic management, pain control, PT/OT if no indication for acute orthopedic surgery.  Spoke with Ozell Purchase who is recommending CT of the femur for further evaluation of periprosthetic fracture, will plan to consult on the patient.   Disposition: After consideration of the diagnostic results and the patients response to treatment, I feel that patient would benefit from hospital admission for femur  .   Final diagnoses:  None    ED Discharge Orders     None          Rosan Sherlean VEAR DEVONNA 03/27/24 1040    Patsey Lot, MD 03/27/24 1406  "

## 2024-03-27 NOTE — Care Management Obs Status (Signed)
 MEDICARE OBSERVATION STATUS NOTIFICATION   Patient Details  Name: [name] MRN: [id number] Date of Birth: 26-Nov-1931   Medicare Observation Status Notification Given:  Yes    Rosalva Jon Bloch, RN 03/27/2024, 3:47 PM

## 2024-03-27 NOTE — Discharge Instructions (Signed)

## 2024-03-27 NOTE — Consult Note (Signed)
 Reason for Consult:Right hip fx Referring Physician: [place] Time called: 0800 Time at bedside: 0946   [name] is an 89 y.o. female.  HPI: [name] fell last night when she got out of bed. She likely slipped on her hardwood floor as she forgot to put on her house shoes. Son heard the fall and came and got her up but she was having severe right hip pain and he brought her to the ED. Workup showed a periprosthetic hip fx and orthopedic surgery was consulted. She lives at home with her son and generally uses a RW for ambulation. She is demented and cannot reliably contribute to history.  Past Medical History:  Diagnosis Date   Aortic stenosis 07/20/2012   Aortic stenosis    Arthritis    Cancer (HCC)    Breast   Carotid bruit 08/06/2008   Doppler - R ECA demonstrates noarrowing w/ elevated velocities consistent w/ >70% diameter reduction; R and L ICAs show no evidence of diameter reduction, significant tortuosity or vascular abnormality;    COPD (chronic obstructive pulmonary disease) (HCC)    Hearing loss    Heart murmur    Hyperlipidemia    Hypertension    dr Court   Osteoporosis    PAF (paroxysmal atrial fibrillation) (HCC) 09/04/2012   PVD (peripheral vascular disease) 08/13/2010   R/P MV - normal pattern of perfusion in all regions, EF 76%; no significant wall abnormalities noted; normal perfusion study   Right carotid bruit    high-grade right external carotid artery stenosis by 2 parts ultrasound 2 years ago   S/P aortic valve replacement with bioprosthetic valve 08/30/2012   21 mm [facility] Ease bovine pericardial tissue valve   Stroke (cerebrum) [facility])     Past Surgical History:  Procedure Laterality Date   ABDOMINAL HYSTERECTOMY     Partial   AORTIC VALVE REPLACEMENT N/A 08/30/2012   Procedure: AORTIC VALVE REPLACEMENT (AVR);  Surgeon: Sudie VEAR Laine, MD;  Location: [facility] OR;  Service: Open Heart Surgery;  Laterality: N/A;   BIOPSY SHOULDER Left 10/08/2005    shave biopsy   BREAST LUMPECTOMY  02/25/1997   right   CARDIAC CATHETERIZATION     CHOLECYSTECTOMY N/A 01/25/2022   Procedure: LAPAROSCOPIC CHOLECYSTECTOMY;  Surgeon: Rubin Calamity, MD;  Location: [facility] OR;  Service: General;  Laterality: N/A;   COLON SURGERY  1959   COSMETIC SURGERY Left 1997   Breast implant   EYE SURGERY  1997   Cataract surgery   INTRAOPERATIVE TRANSESOPHAGEAL ECHOCARDIOGRAM N/A 08/30/2012   Procedure: INTRAOPERATIVE TRANSESOPHAGEAL ECHOCARDIOGRAM;  Surgeon: Sudie VEAR Laine, MD;  Location: [facility] OR;  Service: Open Heart Surgery;  Laterality: N/A;   LEFT AND RIGHT HEART CATHETERIZATION WITH CORONARY ANGIOGRAM N/A 08/14/2012   Procedure: LEFT AND RIGHT HEART CATHETERIZATION WITH CORONARY ANGIOGRAM;  Surgeon: Dorn JINNY Court, MD;  Location: [facility] CATH LAB;  Service: Cardiovascular;  Laterality: N/A;   LEFT HEART CATH  08/14/12   Nl cors, AS   MASTECTOMY  09/29/95   left   PARTIAL HYSTERECTOMY     TOTAL HIP ARTHROPLASTY  09/2010    Family History  Problem Relation Age of Onset   Cancer Mother        Bladder   Early death Father        Tractor accident   Early death Brother        House fire   Early death Brother        during heart surgery  Social History:  reports that she has never smoked. She has never used smokeless tobacco. She reports that she does not drink alcohol and does not use drugs.  Allergies: Allergies[1]  Medications: I have reviewed the patient's current medications.  Results for orders placed or performed during the hospital encounter of 03/27/24 (from the past 48 hours)  CBC     Status: Abnormal   Collection Time: 03/27/24  6:53 AM  Result Value Ref Range   WBC 8.4 4.0 - 10.5 K/uL   RBC 3.04 (L) 3.87 - 5.11 MIL/uL   Hemoglobin 10.4 (L) 12.0 - 15.0 g/dL   HCT 68.8 (L) 63.9 - 53.9 %   MCV 102.3 (H) 80.0 - 100.0 fL   MCH 34.2 (H) 26.0 - 34.0 pg   MCHC 33.4 30.0 - 36.0 g/dL   RDW 86.5 88.4 - 84.4 %   Platelets 159 150 - 400 K/uL   nRBC 0.0  0.0 - 0.2 %    Comment: Performed at [facility], [address] [place], [place] [address]  Basic metabolic panel     Status: Abnormal   Collection Time: 03/27/24  6:53 AM  Result Value Ref Range   Sodium 140 135 - 145 mmol/L   Potassium 3.5 3.5 - 5.1 mmol/L   Chloride 101 98 - 111 mmol/L   CO2 26 22 - 32 mmol/L   Glucose, Bld 140 (H) 70 - 99 mg/dL    Comment: Glucose reference range applies only to samples taken after fasting for at least 8 hours.   BUN 14 8 - 23 mg/dL   Creatinine, Ser 8.94 (H) 0.44 - 1.00 mg/dL   Calcium  9.2 8.9 - 10.3 mg/dL   GFR, Estimated 50 (L) >60 mL/min    Comment: (NOTE) Calculated using the CKD-EPI Creatinine Equation (2021)    Anion gap 13 5 - 15    Comment: Performed at [facility], [address], [place], [place] [address]  APTT     Status: None   Collection Time: 03/27/24  6:53 AM  Result Value Ref Range   aPTT 36 24 - 36 seconds    Comment: Performed at [facility], [address], [place], [place] [address]  Protime-INR     Status: Abnormal   Collection Time: 03/27/24  6:53 AM  Result Value Ref Range   Prothrombin Time 16.4 (H) 11.4 - 15.2 seconds   INR 1.2 0.8 - 1.2    Comment: (NOTE) INR goal varies based on device and disease states. Performed at [facility], [address] [place], [place] [address]   I-stat chem 8, ed     Status: Abnormal   Collection Time: 03/27/24  6:53 AM  Result Value Ref Range   Sodium 140 135 - 145 mmol/L   Potassium 3.3 (L) 3.5 - 5.1 mmol/L   Chloride 99 98 - 111 mmol/L   BUN 14 8 - 23 mg/dL   Creatinine, Ser 8.79 (H) 0.44 - 1.00 mg/dL   Glucose, Bld 867 (H) 70 - 99 mg/dL    Comment: Glucose reference range applies only to samples taken after fasting for at least 8 hours.   Calcium , Ion 1.13 (L) 1.15 - 1.40 mmol/L   TCO2 26 22 - 32 mmol/L   Hemoglobin 10.2 (L) 12.0 - 15.0 g/dL   HCT 69.9 (L) 63.9 - 53.9 %    DG HIP UNILAT WITH PELVIS 1V RIGHT Result Date: 03/27/2024 EXAM:  3 VIEW(S) XRAY  OF THE PELVIS AND RIGHT HIP 03/27/2024 07:57:00 AM COMPARISON: Pelvis radiographs 03/27/2024 06:54 AM. CLINICAL HISTORY: 89 year old female. Fall. Proximal right femur periprosthetic fracture. FINDINGS: BONES AND JOINTS: SI joints are symmetric. Stable and intact visible pelvis. Right hip: Mildly comminuted and minimally displaced periprosthetic fracture of the proximal right femur beginning at the distal trochanter level appears stable. Underlying right bipolar hip arthroplasty hardware appears intact and aligned. Left hip: Normal alignment. SOFT TISSUES: Unremarkable. IMPRESSION: 1. Stable mildly comminuted, minimally displaced proximal right femur periprosthetic fracture. 2. Intact and aligned right bipolar hip arthroplasty hardware. Electronically signed by: Helayne Hurst MD 03/27/2024 08:03 AM EST RP Workstation: [id number]   CT Cervical Spine Wo Contrast Result Date: 03/27/2024 EXAM: CT CERVICAL SPINE WITHOUT CONTRAST 03/27/2024 07:41:00 AM TECHNIQUE: CT of the cervical spine was performed without the administration of intravenous contrast. Multiplanar reformatted images are provided for review. Automated exposure control, iterative reconstruction, and/or weight based adjustment of the mA/kV was utilized to reduce the radiation dose to as low as reasonably achievable. COMPARISON: CT cervical spine 03/19/2023. CLINICAL HISTORY: Neck trauma. Fall, on blood thinners. FINDINGS: BONES AND ALIGNMENT: Chronic straightening of the normal cervical lordosis. Trace anterolisthesis of C3 on C4 and C7 on T1. Trace retrolisthesis of C5 on C6. No acute fracture or traumatic malalignment. No suspicious bone lesion. DEGENERATIVE CHANGES: Multilevel disc degeneration which is most advanced at C2-C3, C5-C6, and C6-C7. Asymmetric left sided facet arthrosis in the upper and mid cervical spine. Moderately advanced atlantodental degenerative changes. SOFT TISSUES: No prevertebral soft tissue swelling. LUNGS: Biapical  pleural parenchymal lung scarring and calcification. IMPRESSION: 1. No acute cervical spine fracture or traumatic malalignment. Electronically signed by: Dasie Hamburg MD 03/27/2024 07:51 AM EST RP Workstation: [id number]   CT Head Wo Contrast Result Date: 03/27/2024 EXAM: CT HEAD WITHOUT CONTRAST 03/27/2024 07:41:00 AM TECHNIQUE: CT of the head was performed without the administration of intravenous contrast. Automated exposure control, iterative reconstruction, and/or weight based adjustment of the mA/kV was utilized to reduce the radiation dose to as low as reasonably achievable. COMPARISON: Head CT 07/16/2023 and MRI 06/24/2020. CLINICAL HISTORY: Head trauma, minor (Age >= 65 years). Fall, on blood thinners. FINDINGS: BRAIN AND VENTRICLES: There is no evidence of an acute infarct, intracranial hemorrhage, mass, midline shift, hydrocephalus, or extra-axial fluid collection. Mild cerebral atrophy is within normal limits for age. Patchy hypodensities in the cerebral white matter bilaterally are similar to the prior CT and are nonspecific but compatible with moderate chronic small vessel ischemic disease. Chronic bilateral thalamic lacunar infarcts are again noted. Calcified atherosclerosis at the skull base. ORBITS: Bilateral cataract extraction. SINUSES: No acute abnormality. SOFT TISSUES AND SKULL: Moderate sized right parietal scalp hematoma. No skull fracture. IMPRESSION: 1. No acute intracranial abnormality. 2. Right parietal scalp hematoma. 3. Moderate chronic small vessel ischemic disease. Electronically signed by: Dasie Hamburg MD 03/27/2024 07:48 AM EST RP Workstation: [id number]   DG Pelvis Portable Result Date: 03/27/2024 EXAM: 2 VIEW(S) XRAY OF THE PELVIS 03/27/2024 07:02:00 AM COMPARISON: CT abdomen and pelvis 03/23/2022, and pelvis radiograph 03/19/2023. CLINICAL HISTORY: 89 year old female. Right hip pain after a fall on blood thinners. FINDINGS: BONES AND JOINTS: Total right hip arthroplasty in  place. Stable AP alignment of right bipolar hip arthroplasty components. Acute, largely nondisplaced periprosthetic fracture along the proximal right femoral shaft. Mild degenerative changes of the left hip joint with mild-to-moderate joint space narrowing and osteophytosis of the superior acetabulum. SOFT TISSUES: Vascular calcifications. IMPRESSION: 1. Acute, largely nondisplaced periprosthetic fracture of the proximal right  femur. 2. Stable AP alignment of right bipolar hip arthroplasty components. Electronically signed by: Helayne Hurst MD 03/27/2024 07:07 AM EST RP Workstation: [id number]   DG Chest Portable 1 View Result Date: 03/27/2024 EXAM: 1 VIEW XRAY OF THE CHEST 03/27/2024 07:02:00 AM COMPARISON: 06/09/2023 CLINICAL HISTORY: 89 year old female. Right hip pain after a fall on blood thinners. FINDINGS: Asymmetric breast attenuation artifact suspected over the left chest. LUNGS AND PLEURA: Biapical pleural thickening with calcification. No focal pulmonary opacity. No pleural effusion. No pneumothorax. HEART AND MEDIASTINUM: Cardiomegaly. Calcified aorta. Prosthetic cardiac valve noted. Sternotomy wires noted. BONES AND SOFT TISSUES: Chronic severe right greater than left glenohumeral degeneration. Bilateral shoulder degenerative joint disease (DJD). Negative visible bowel gas. No acute osseous abnormality. IMPRESSION: 1. No acute cardiopulmonary abnormality. 2. Cardiomegaly with prosthetic cardiac valve. 3. Aortic Atherosclerosis (ICD10-I70.0). Electronically signed by: Helayne Hurst MD 03/27/2024 07:05 AM EST RP Workstation: [id number]    Review of Systems  Unable to perform ROS: Dementia  Musculoskeletal:  Positive for arthralgias (Right hip).   Blood pressure (!) 129/52, pulse (!) 43, temperature (!) 97.5 F (36.4 C), temperature source Axillary, resp. rate 19, height 5' 3 (1.6 m), weight 64.9 kg, SpO2 100%. Physical Exam Constitutional:      General: She is not in acute distress.     Appearance: She is well-developed. She is not diaphoretic.  HENT:     Head: Normocephalic and atraumatic.  Eyes:     General: No scleral icterus.       Right eye: No discharge.        Left eye: No discharge.     Conjunctiva/sclera: Conjunctivae normal.  Cardiovascular:     Rate and Rhythm: Normal rate and regular rhythm.  Pulmonary:     Effort: Pulmonary effort is normal. No respiratory distress.  Musculoskeletal:     Cervical back: Normal range of motion.     Comments: RLE No traumatic wounds, ecchymosis, or rash  Mod TTP hip  No knee or ankle effusion  Knee stable to varus/ valgus and anterior/posterior stress  Sens DPN, SPN, TN intact  Motor EHL, ext, flex, evers 5/5  DP 2+, PT 1+, No significant edema  Skin:    General: Skin is warm and dry.  Neurological:     Mental Status: She is alert.  Psychiatric:        Mood and Affect: Mood normal.        Behavior: Behavior normal.     Assessment/Plan: Right hip fx -- Plan non-operative management with WBAT with RW, no active abduction. F/u with Dr. Edna in 2 weeks.    Ozell DOROTHA Ned, PA-C Orthopedic Surgery [phone] 03/27/2024, 9:53 AM      [1]  Allergies Allergen Reactions   Lisinopril  Cough   Ceftriaxone  Rash   Codeine Rash    All over the body.

## 2024-03-27 NOTE — ED Triage Notes (Addendum)
 Pt to ED by EMS from home with c/o a witnessed fall on blood thinners. Pt was getting out of bed when she tripped and fell. Pt does not recall the event. C/O R hip pain and a hematoma to the right parietal area. VSS, EDP at bedside.

## 2024-03-27 NOTE — Care Management CC44 (Signed)
"         Condition Code 44 Documentation Completed  Patient Details  Name: [name] MRN: [id number] Date of Birth: 1931/07/20   Condition Code 44 given:  Yes Patient signature on Condition Code 44 notice:  Yes Documentation of 2 MD's agreement:  Yes Code 44 added to claim:  Yes    Rosalva Jon Bloch, RN 03/27/2024, 3:47 PM  "

## 2024-03-27 NOTE — Plan of Care (Signed)

## 2024-03-27 NOTE — Progress Notes (Signed)
 Orthopedic Tech Progress Note Patient Details:  [name] August 26, 1931 [id number]  Patient ID: [name] [name], female   DOB: 10/17/1931, 89 y.o.   MRN: [id number] I attended trauma page. Chandra Dorn PARAS 03/27/2024, 6:57 AM

## 2024-03-27 NOTE — Progress Notes (Signed)
" °   03/27/24 0645  Spiritual Encounters  Type of Visit Initial  Care provided to: Pt not available  Referral source Trauma page  Reason for visit Trauma  OnCall Visit No   Chaplain responded to a level two trauma.  No family present If a chaplain is requested someone will respond.  Carley [place] [facility]  [phone] "

## 2024-03-27 NOTE — H&P (Signed)
 " History and Physical    Patient: [name] DOB: Apr 23, 1931 DOA: 03/27/2024 DOS: the patient was seen and examined on 03/27/2024 PCP: [name], NP  Patient coming from: Home via EMS  Chief Complaint:  Chief Complaint  Patient presents with   FOT   HPI: [name] is a 89 y.o. female with medical history significant of retention, hyperlipidemia, atrial fibrillation,  s/p aortic valve replacement, CVA, dementia, breast cancer presents with a fall with right-sided hip pain.  She experienced a fall while getting out of bed, which was unwitnessed by her son. She was not using her walker at the time and was wearing socks on a hardwood floor, which may have contributed to the fall. Due to her dementia, she does not recall the fall.  She has a history of bilateral hip replacements, with one side having been replaced twice.  She hit her head with the fall but unclear if she lost consciousness  She has dementia, which affects her memory and recognition of family members. Her son notes that   In the emergency department patient was noted to be afebrile with pulse 43-78, blood pressures 129/52 to 152/57, and O2 saturations maintained.  Labs note hemoglobin 10.4 with MCV 102.3, and MCH 34.2, and creatinine 1.05.  Chest x-ray noted no acute abnormality and cardiomegaly with prosthetic cardiac valve.  X-ray of the pelvis noted acute large nondisplaced periprosthetic fracture of the proximal right femur.  CT scan of the head did not note any acute abnormality in the right parietal scalp hematoma.  CT scan of the cervical spine did not reveal any acute fracture.  Orthopedics had been consulted for need of surgical correction.  Review of Systems: As mentioned in the history of present illness. All other systems reviewed and are negative.  Patient does have dementia. Past Medical History:  Diagnosis Date   Aortic stenosis 07/20/2012   Aortic stenosis    Arthritis     Cancer (HCC)    Breast   Carotid bruit 08/06/2008   Doppler - R ECA demonstrates noarrowing w/ elevated velocities consistent w/ >70% diameter reduction; R and L ICAs show no evidence of diameter reduction, significant tortuosity or vascular abnormality;    COPD (chronic obstructive pulmonary disease) (HCC)    Hearing loss    Heart murmur    Hyperlipidemia    Hypertension    dr Court   Osteoporosis    PAF (paroxysmal atrial fibrillation) (HCC) 09/04/2012   PVD (peripheral vascular disease) 08/13/2010   R/P MV - normal pattern of perfusion in all regions, EF 76%; no significant wall abnormalities noted; normal perfusion study   Right carotid bruit    high-grade right external carotid artery stenosis by 2 parts ultrasound 2 years ago   S/P aortic valve replacement with bioprosthetic valve 08/30/2012   21 mm [facility] Ease bovine pericardial tissue valve   Stroke (cerebrum) [facility])    Past Surgical History:  Procedure Laterality Date   ABDOMINAL HYSTERECTOMY     Partial   AORTIC VALVE REPLACEMENT N/A 08/30/2012   Procedure: AORTIC VALVE REPLACEMENT (AVR);  Surgeon: Sudie VEAR Laine, MD;  Location: [facility] OR;  Service: Open Heart Surgery;  Laterality: N/A;   BIOPSY SHOULDER Left 10/08/2005   shave biopsy   BREAST LUMPECTOMY  02/25/1997   right   CARDIAC CATHETERIZATION     CHOLECYSTECTOMY N/A 01/25/2022   Procedure: LAPAROSCOPIC CHOLECYSTECTOMY;  Surgeon: Rubin Calamity, MD;  Location: [facility] OR;  Service: General;  Laterality: N/A;   COLON SURGERY  1959   COSMETIC SURGERY Left 1997   Breast implant   EYE SURGERY  1997   Cataract surgery   INTRAOPERATIVE TRANSESOPHAGEAL ECHOCARDIOGRAM N/A 08/30/2012   Procedure: INTRAOPERATIVE TRANSESOPHAGEAL ECHOCARDIOGRAM;  Surgeon: Sudie VEAR Laine, MD;  Location: [facility] OR;  Service: Open Heart Surgery;  Laterality: N/A;   LEFT AND RIGHT HEART CATHETERIZATION WITH CORONARY ANGIOGRAM N/A 08/14/2012   Procedure: LEFT AND RIGHT HEART CATHETERIZATION WITH  CORONARY ANGIOGRAM;  Surgeon: Dorn JINNY Lesches, MD;  Location: [facility] CATH LAB;  Service: Cardiovascular;  Laterality: N/A;   LEFT HEART CATH  08/14/12   Nl cors, AS   MASTECTOMY  09/29/95   left   PARTIAL HYSTERECTOMY     TOTAL HIP ARTHROPLASTY  09/2010   Social History:  reports that she has never smoked. She has never used smokeless tobacco. She reports that she does not drink alcohol and does not use drugs.  Allergies[1]  Family History  Problem Relation Age of Onset   Cancer Mother        Bladder   Early death Father        Tractor accident   Early death Brother        Tax inspector   Early death Brother        during heart surgery    Prior to Admission medications  Medication Sig Start Date End Date Taking? Authorizing Provider  acetaminophen  (TYLENOL ) 650 MG CR tablet Take 1 tablet (650 mg total) by mouth 2 (two) times daily. 12/25/21   [name], NP  albuterol  (VENTOLIN  HFA) 108 (90 Base) MCG/ACT inhaler Inhale 2 puffs into the lungs every 6 (six) hours as needed for wheezing or shortness of breath. 05/05/22   [name], NP  apixaban  (ELIQUIS ) 5 MG TABS tablet Take 1 tablet (5 mg total) by mouth 2 (two) times daily. 02/06/24   Lesches Dorn JINNY, MD  Ascorbic Acid (VITAMIN C) 100 MG tablet Take 100 mg by mouth daily.    [provider]  atorvastatin  (LIPITOR) 40 MG tablet Take 1 tablet (40 mg total) by mouth daily. 01/20/24   Eubanks, Jessica K, NP  Calcium  Citrate-Vitamin D  (CITRACAL + D PO) Take 1 tablet by mouth daily.    [provider]  diclofenac  Sodium (VOLTAREN ) 1 % GEL Apply 4 g topically 4 (four) times daily as needed. 12/25/21   [name], NP  furosemide  (LASIX ) 20 MG tablet Take 1 tablet (20 mg total) by mouth daily. 03/22/24   Eubanks, Jessica K, NP  Magnesium  Hydroxide (PHILLIPS MILK OF MAGNESIA PO) Take 15 mLs by mouth at bedtime. Take daily at 6 o'clock at night    [provider]  metoprolol  tartrate (LOPRESSOR ) 25 MG  tablet Take 0.5 tablets (12.5 mg total) by mouth 2 (two) times daily. 03/22/24   Eubanks, Jessica K, NP  potassium chloride  (KLOR-CON  M) 10 MEQ tablet Take 1/2 tablet (5 mEq total) by mouth daily. 01/20/24   [name], NP  valsartan  (DIOVAN ) 160 MG tablet Take 1 tablet (160 mg total) by mouth in the morning. 01/12/24   [name], NP    Physical Exam: Vitals:   03/27/24 0830 03/27/24 0845 03/27/24 1000 03/27/24 1028  BP: (!) 152/57 (!) 129/52 (!) (P) 185/87 (!) 151/88  Pulse: (!) 54 (!) 43 (P) 99 78  Resp: 15 19 (P) 19 19  Temp:      TempSrc:      SpO2:  100% 100%  98%  Weight:      Height:         Constitutional: Elderly female currently in acute distress Eyes: PERRL, lids and conjunctivae normal ENMT: Mucous membranes are moist.  ormal dentition.  Neck: normal, supple  Respiratory: clear to auscultation bilaterally, no wheezing, no crackles. Normal respiratory effort. No accessory muscle use.  Cardiovascular: Regular rate and rhythm, no murmurs / rubs / gallops. No extremity edema. 2+ pedal pulses. .  Abdomen: no tenderness, no masses palpated. Bowel sounds positive.  Musculoskeletal: no clubbing / cyanosis. No joint deformity upper and lower extremities. Good ROM, no contractures. Normal muscle tone.  Skin: no rashes, lesions, ulcers. No induration Neurologic: CN 2-12 grossly intact.  Strength 5/5 in all 4.  Psychiatric: Alert and oriented to self.  Normal gait  Data Reviewed:  EKG revealed atrial fibrillation at 59 bpm.  Reviewed labs, imaging, and pertinent records as documented.  Assessment and Plan:   Periprosthetic right hip fracture secondary to fall Acute.  Patient presents after fall at home that was unwitnessed.  She lives at home with her son and utilizes a rolling walker at baseline.  X-rays revealed a periprosthetic fracture of the right proximal femur. - Admit to a telemetry bed - Hip fracture order set utilized - Weightbearing as tolerated -  PT to eval and treat - Hydrocodone /fentanyl  IV as needed for moderate to severe pain -Anesthesia  consult per right hip nerve block.- PT/OT to eval and treat - Orthopedics consulted  Atrial fibrillation Atrial fibrillation patient rate controlled on metoprolol . - Continue metoprolol  and Eliquis  as no surgical procedures  Heart failure with preserved ejection fraction Patient appears to be euvolemic on physical exam.  Last ejection fraction noted to be 60 to 65% with indeterminate diastolic parameters on last check 09/24/2022. - Strict I&O's and daily - Continue beta-blocker, ARB, and Lasix   Essential hypertension Blood pressures noted to be elevated up to 166/82 - Continue metoprolol , furosemide , and pharmacy substitution of irbesartan   Macrocytic anemia Hemoglobin noted to be 13.4 with MCV 102.3 and MCH 34.2.  Hemoglobin appears near patient's baseline. - Monitor  Hyperlipidemia - Continue atorvastatin   Chronic kidney disease stage IIIa Creatinine 1.05 which appears near patient's baseline. - Continue to monitor  Dementia - Delirium precautions  DVT Prophylaxis: Eliquis  Advance Care Planning:   Code Status: Full Code    Consults: Orthopedics  Family Communication: Son updated at bedside  Severity of Illness: The appropriate patient status for this patient is INPATIENT. Inpatient status is judged to be reasonable and necessary in order to provide the required intensity of service to ensure the patient's safety. The patient's presenting symptoms, physical exam findings, and initial radiographic and laboratory data in the context of their chronic comorbidities is felt to place them at high risk for further clinical deterioration. Furthermore, it is not anticipated that the patient will be medically stable for discharge from the hospital within 2 midnights of admission.   * I certify that at the point of admission it is my clinical judgment that the patient will require  inpatient hospital care spanning beyond 2 midnights from the point of admission due to high intensity of service, high risk for further deterioration and high frequency of surveillance required.*  Author: Maximino DELENA Sharps, MD 03/27/2024 10:41 AM  For on call review [email or web address].     [1]  Allergies Allergen Reactions   Lisinopril  Cough   Ceftriaxone  Rash   Codeine Rash    All over the  body.   "

## 2024-03-27 NOTE — Care Management (Signed)
 SDOH resources for social isolation added to AVS

## 2024-03-28 DIAGNOSIS — Z96649 Presence of unspecified artificial hip joint: Secondary | ICD-10-CM | POA: Diagnosis not present

## 2024-03-28 DIAGNOSIS — M978XXA Periprosthetic fracture around other internal prosthetic joint, initial encounter: Secondary | ICD-10-CM | POA: Diagnosis not present

## 2024-03-28 NOTE — Plan of Care (Signed)
  Problem: Clinical Measurements: Goal: Will remain free from infection Outcome: Progressing Goal: Cardiovascular complication will be avoided Outcome: Progressing   Problem: Nutrition: Goal: Adequate nutrition will be maintained Outcome: Progressing   Problem: Coping: Goal: Level of anxiety will decrease Outcome: Progressing   

## 2024-03-28 NOTE — Evaluation (Signed)
 Physical Therapy Evaluation Patient Details Name: [name] MRN: [id number] DOB: December 03, 1931 Today's Date: 03/28/2024  History of Present Illness  89 yo admitted after a fall OOB resulting in R hip preiprosthetic fx, non-op mgmnt, WBAT with RW, no active abduction;  with medical history significant of retention, hyperlipidemia, atrial fibrillation,  s/p aortic valve replacement, CVA, dementia, breast cancer  Clinical Impression   Pt admitted with above diagnosis. Lives at home (son stays with her at or very near 24 hours/day), in a single-level home with 3 steps to enter; Prior to admission, pt was able to manage walking household distances, typically with RW; Son would take her out to eat, and cruise around town, hair appointment weekly; Presents to PT with significant R hip pain with any movement, leading to dependencies with functional mobility and ADLs;  In considering options for discharge, I value going back to familiar environment, caregivers, and routines for patients with dementia; would like to maximize [facility] services and caregiver support; Pt's son is well-versed  in caregiving for pt - at her current status, though, with non-op R hip fx, moving is much more painful; Will plan for another session to focus on caregver education for [facility]; Pt currently with functional limitations due to the deficits listed below (see PT Problem List). Pt will benefit from skilled PT to increase their independence and safety with mobility to allow discharge to the venue listed below.           If plan is discharge home, recommend the following: A lot of help with walking and/or transfers;Two people to help with walking and/or transfers;A lot of help with bathing/dressing/bathroom;Help with stairs or ramp for entrance;Supervision due to cognitive status   Can travel by private vehicle        Equipment Recommendations Wheelchair (measurements PT);Wheelchair cushion (measurements PT);Hoyer lift;Hospital bed  (consider drop-arm BSC, and possibly sliding board (likely son would not choose to use it))  Recommendations for Other Services  Other (comment) (Consider Outpt Palliative to follow)    Functional Status Assessment Patient has had a recent decline in their functional status and/or demonstrates limited ability to make significant improvements in function in a reasonable and predictable amount of time     Precautions / Restrictions Precautions Precautions: Fall;Other (comment) Recall of Precautions/Restrictions: Impaired Precaution/Restrictions Comments: R hip no active abduction Restrictions RLE Weight Bearing Per Provider Order: Weight bearing as tolerated      Mobility  Bed Mobility Overal bed mobility: Needs Assistance Bed Mobility: Supine to Sit, Sit to Supine     Supine to sit: +2 for physical assistance, Total assist Sit to supine: +2 for physical assistance, Total assist   General bed mobility comments: Used Bed pad and helicopter technique; incr time at beginning to use HOB elevation (very slowly, with monitoring of grimace) to help with trunk elevation    Transfers Overall transfer level: Needs assistance Equipment used: 2 person hand held assist Transfers: Sit to/from Stand, Bed to chair/wheelchair/BSC Sit to Stand: +2 physical assistance, Total assist          Lateral/Scoot Transfers: +2 physical assistance, Total assist General transfer comment: Stood from EOB with 2 person bilateral support and assist; Used bed pad to help scoot to [facility] in simulated lateral scoot    Ambulation/Gait                  Stairs            Wheelchair Mobility     Tilt Bed  Modified Rankin (Stroke Patients Only)       Balance Overall balance assessment: Needs assistance Sitting-balance support: Feet supported, Bilateral upper extremity supported, Single extremity supported Sitting balance-Leahy Scale: Fair Sitting balance - Comments: initially needing up to  mod assist and support; once stable, able to sit EOB with CGA, and particpate in light ADL     Standing balance-Leahy Scale: Zero Standing balance comment: 2 person Total A                             Pertinent Vitals/Pain Pain Assessment Pain Assessment: Faces Faces Pain Scale: Hurts worst Breathing: occasional labored breathing, short period of hyperventilation Negative Vocalization: occasional moan/groan, low speech, negative/disapproving quality Facial Expression: facial grimacing Body Language: tense, distressed pacing, fidgeting Consolability: distracted or reassured by voice/touch PAINAD Score: 6 Pain Location: R hip with movement Pain Descriptors / Indicators: Grimacing, Guarding Pain Intervention(s): Repositioned, Ice applied, Premedicated before session    Home Living Family/patient expects to be discharged to:: Private residence Living Arrangements: Alone Available Help at Discharge: Family;Available PRN/intermittently (almost all the time) Type of Home: House Home Access: Stairs to enter Entrance Stairs-Rails: (P) None Entrance Stairs-Number of Steps: 3   Home Layout: One level Home Equipment: Agricultural Consultant (2 wheels)      Prior Function Prior Level of Function : Needs assist             Mobility Comments: Uses rW for most ambulation; son supervises walking outdoors ADLs Comments: son assists with IADLs, takes her to get her hair done weekly, assisted with managing brief,  bathing and dressing     Extremity/Trunk Assessment   Upper Extremity Assessment Upper Extremity Assessment: Defer to OT evaluation;Right hand dominant    Lower Extremity Assessment Lower Extremity Assessment: RLE deficits/detail RLE Deficits / Details: Noting significant incr in R hip pain with any movemetn of RLE, including movement at lower leg RLE: Unable to fully assess due to pain       Communication   Communication Communication: Impaired Factors Affecting  Communication: Hearing impaired;Difficulty expressing self (low volume)    Cognition Arousal: Alert Behavior During Therapy: Flat affect   PT - Cognitive impairments: History of cognitive impairments                       PT - Cognition Comments: Pt with dementia; near baseline Following commands: Impaired Following commands impaired: Follows one step commands with increased time (and multimodal cueing)     Cueing Cueing Techniques: Verbal cues, Gestural cues, Tactile cues, Visual cues     General Comments General comments (skin integrity, edema, etc.): Son, Oneil, present and helpful; reports confidence in ability to manage at home; Will need another PT/OT session for family education in caregiving; We covered positioning and repositioning for pressure relief, and general concepts for helping pt move; he was engaged in session    Exercises     Assessment/Plan    PT Assessment Patient needs continued PT services  PT Problem List Decreased strength;Decreased range of motion;Decreased activity tolerance;Decreased balance;Decreased mobility;Decreased coordination;Decreased cognition;Decreased knowledge of use of DME;Decreased knowledge of precautions;Decreased safety awareness;Pain       PT Treatment Interventions DME instruction;Functional mobility training;Therapeutic activities;Therapeutic exercise;Stair training;Balance training;Neuromuscular re-education;Cognitive remediation;Patient/family education;Wheelchair mobility training;Manual techniques;Modalities    PT Goals (Current goals can be found in the Care Plan section)  Acute Rehab PT Goals Patient Stated Goal: Did not state PT Goal Formulation: With  family Time For Goal Achievement: 04/11/24 Potential to Achieve Goals: Good    Frequency Min 2X/week     Co-evaluation               AM-PAC PT 6 Clicks Mobility  Outcome Measure Help needed turning from your back to your side while in a flat bed without  using bedrails?: Total Help needed moving from lying on your back to sitting on the side of a flat bed without using bedrails?: Total Help needed moving to and from a bed to a chair (including a wheelchair)?: Total Help needed standing up from a chair using your arms (e.g., wheelchair or bedside chair)?: Total Help needed to walk in hospital room?: Total Help needed climbing 3-5 steps with a railing? : Total 6 Click Score: 6    End of Session Equipment Utilized During Treatment: Other (comment) (mostly bed pads) Activity Tolerance: Patient limited by pain Patient left: in bed;with call bell/phone within reach;with bed alarm set (bed in semi-chair position) Nurse Communication: Mobility status;Need for lift equipment (Recs for dc) PT Visit Diagnosis: Other abnormalities of gait and mobility (R26.89);History of falling (Z91.81);Pain Pain - Right/Left: Right Pain - part of body: Hip    Time: 8994-8975 PT Time Calculation (min) (ACUTE ONLY): 19 min   Charges:   PT Evaluation $PT Eval Moderate Complexity: 1 Mod   PT General Charges $$ ACUTE PT VISIT: 1 Visit         Silvano Currier, PT  Acute Rehabilitation Services Office [phone] Secure Chat welcomed   Silvano VEAR Currier 03/28/2024, 11:43 AM

## 2024-03-28 NOTE — Progress Notes (Signed)
 Transition of [facility]) - CAGE-AID Screening   Patient Details  Name: [name] MRN: [id number] Date of Birth: November 30, 1931  [facility] of [facility]) CM/SW Contact:    Bernardino Mayotte, RN Phone Number: 03/28/2024, 6:40 AM   Clinical Narrative:  Patient denies the use of alcohol and illicit substances. Resources not given at this time.  CAGE-AID Screening:    Have You Ever Felt You Ought to Cut Down on Your Drinking or Drug Use?: No Have People Annoyed You By Critizing Your Drinking Or Drug Use?: No Have You Felt Bad Or Guilty About Your Drinking Or Drug Use?: No Have You Ever Had a Drink or Used Drugs First Thing In The Morning to Steady Your Nerves or to Get Rid of a Hangover?: No CAGE-AID Score: 0  Substance Abuse Education Offered: No

## 2024-03-28 NOTE — Hospital Course (Addendum)
 [name] is a 89 y.o. female with medical history significant of hyperlipidemia, atrial fibrillation,  s/p aortic valve replacement, CVA, dementia, breast cancer presents with a fall with right-sided hip pain.   She experienced a fall while getting out of bed, which was unwitnessed by her son. She was not using her walker at the time and was wearing socks on a hardwood floor, which may have contributed to the fall. Due to her dementia, she does not recall the fall.   She has a history of bilateral hip replacements, with one side having been replaced twice.   She hit her head with the fall but unclear if she lost consciousness   She has dementia, which affects her memory and recognition of family members.   X-ray of the pelvis noted acute large nondisplaced periprosthetic fracture of the proximal right femur.  CT scan of the head did not note any acute abnormality in the right parietal scalp hematoma.  CT scan of the cervical spine did not reveal any acute fracture.  Orthopedics had been consulted for need of surgical correction.  Assessment and Plan:   Periprosthetic right hip fracture secondary to fall Acute.  Patient presents after fall at home that was unwitnessed.  She lives at home with her son and utilizes a rolling walker at baseline.  X-rays revealed a periprosthetic fracture of the right proximal femur. - WBAT to RLE, no abduction - outpt follow up with ortho, [name] in 2 weeks - continue pain control; did not tolerate vicodin 2/2 itching; trying tramadol  today - Evaluated by physical therapy, plan is for discharging home given her dementia and likely will do better at home - [facility] arranged prior to d/c and DME   Atrial fibrillation Atrial fibrillation patient rate controlled on metoprolol . - Continue metoprolol  and Eliquis  as no surgical procedures   Heart failure with preserved ejection fraction Patient appears to be euvolemic on physical exam.  Last ejection  fraction noted to be 60 to 65% with indeterminate diastolic parameters on last check 09/24/2022. - Strict I&O's and daily - Continue beta-blocker, ARB, and Lasix   Essential hypertension Blood pressures noted to be elevated up to 166/82 - Continue metoprolol , furosemide , and ARB   Macrocytic anemia Hemoglobin noted to be 13.4 with MCV 102.3 and MCH 34.2.  Hemoglobin appears near patient's baseline   Hyperlipidemia - Continue atorvastatin    Chronic kidney disease stage IIIa Creatinine 1.05 which appears near patient's baseline. - Continue to monitor   Dementia - Delirium precautions

## 2024-03-28 NOTE — Progress Notes (Signed)
 " Progress Note    [name]   FMW:[id number]  DOB: 1932-02-28  DOA: 03/27/2024     1 PCP: [name], NP  Initial CC: Fall at home  Hospital Course: [name] is a 89 y.o. female with medical history significant of hyperlipidemia, atrial fibrillation,  s/p aortic valve replacement, CVA, dementia, breast cancer presents with a fall with right-sided hip pain.   She experienced a fall while getting out of bed, which was unwitnessed by her son. She was not using her walker at the time and was wearing socks on a hardwood floor, which may have contributed to the fall. Due to her dementia, she does not recall the fall.   She has a history of bilateral hip replacements, with one side having been replaced twice.   She hit her head with the fall but unclear if she lost consciousness   She has dementia, which affects her memory and recognition of family members.   X-ray of the pelvis noted acute large nondisplaced periprosthetic fracture of the proximal right femur.  CT scan of the head did not note any acute abnormality in the right parietal scalp hematoma.  CT scan of the cervical spine did not reveal any acute fracture.  Orthopedics had been consulted for need of surgical correction.  Assessment and Plan:   Periprosthetic right hip fracture secondary to fall Acute.  Patient presents after fall at home that was unwitnessed.  She lives at home with her son and utilizes a rolling walker at baseline.  X-rays revealed a periprosthetic fracture of the right proximal femur. - WBAT to RLE, no abduction - outpt follow up with ortho, Dr. Edna in 2 weeks - continue pain control - Evaluated by physical therapy, plan is for discharging home given her dementia and likely will do better at home -Awaiting DME to be confirmed prior to discharge home   Atrial fibrillation Atrial fibrillation patient rate controlled on metoprolol . - Continue metoprolol  and Eliquis  as no surgical  procedures   Heart failure with preserved ejection fraction Patient appears to be euvolemic on physical exam.  Last ejection fraction noted to be 60 to 65% with indeterminate diastolic parameters on last check 09/24/2022. - Strict I&O's and daily - Continue beta-blocker, ARB, and Lasix   Essential hypertension Blood pressures noted to be elevated up to 166/82 - Continue metoprolol , furosemide , and pharmacy substitution of irbesartan    Macrocytic anemia Hemoglobin noted to be 13.4 with MCV 102.3 and MCH 34.2.  Hemoglobin appears near patient's baseline. - Monitor   Hyperlipidemia - Continue atorvastatin    Chronic kidney disease stage IIIa Creatinine 1.05 which appears near patient's baseline. - Continue to monitor   Dementia - Delirium precautions  Interval History:  Son present bedside this morning. Patient sleeping and not interactive much but did work with PT prior to my rounding. Plan is home once DME in place. Son is comfortable with plan.   Antimicrobials:   Consultants:  Ortho  Procedures:    DVT prophylaxis:   apixaban  (ELIQUIS ) tablet 5 mg   Code Status:   Code Status: Full Code  Barriers to discharge: none Therapy evaluation: PT Orders: Active   PT Follow up Rec: Home Health Pt1/28/2026 1100  Disposition Plan:  Home Status is: Obs  Mobility Assessment (Last 72 Hours)     Mobility Assessment     Row Name 03/28/24 1200 03/28/24 1100 03/27/24 2000 03/27/24 1522     Does the patient have exclusion criteria? -- -- No-  Perform mobility assessment No- Perform mobility assessment    What is the highest level of mobility based on the mobility assessment? Level 3 (Stands with assistance) - Balance while standing  and cannot march in place Level 1 (Bedfast) - Unable to balance while sitting on edge of bed Level 1 (Bedfast) - Unable to balance while sitting on edge of bed Level 1 (Bedfast) - Unable to balance while sitting on edge of bed    Is the above level  different from baseline mobility prior to current illness? -- -- Yes - Recommend PT order Yes - Recommend PT order       Diet: Diet Orders (From admission, onward)     Start     Ordered   03/27/24 1145  Diet Heart Room service appropriate? Yes; Fluid consistency: Thin  Diet effective now       Question Answer Comment  Room service appropriate? Yes   Fluid consistency: Thin      03/27/24 1144            Objective: Blood pressure (!) 109/47, pulse (!) 59, temperature 98.1 F (36.7 C), resp. rate 16, height 5' 3 (1.6 m), weight 64.9 kg, SpO2 97%.  Examination:  Physical Exam Constitutional:      Comments: Pleasant elderly woman resting in bed in no distress with obvious dementia  HENT:     Head: Normocephalic and atraumatic.     Mouth/Throat:     Mouth: Mucous membranes are moist.  Eyes:     Extraocular Movements: Extraocular movements intact.  Cardiovascular:     Rate and Rhythm: Normal rate and regular rhythm.  Pulmonary:     Effort: Pulmonary effort is normal. No respiratory distress.     Breath sounds: Normal breath sounds. No wheezing.  Abdominal:     General: Bowel sounds are normal. There is no distension.     Palpations: Abdomen is soft.     Tenderness: There is no abdominal tenderness.  Musculoskeletal:        General: Normal range of motion.     Cervical back: Normal range of motion and neck supple.  Skin:    General: Skin is warm and dry.  Neurological:     General: No focal deficit present.  Psychiatric:        Mood and Affect: Mood normal.      Data Reviewed: No results found for this or any previous visit (from the past 24 hours).  I have reviewed pertinent nursing notes, vitals, labs, and images as necessary. I have ordered labwork to follow up on as indicated.  I have reviewed the last notes from staff over past 24 hours. I have discussed patient's care plan and test results with nursing staff, CM/SW, and other staff as appropriate.  Old  records reviewed in assessment of this patient  Time spent: Greater than 50% of the 55 minute visit was spent in counseling/coordination of care for the patient as laid out in the A&P.   LOS: 1 day   Alm Apo, MD [facility] 03/28/2024, 3:19 PM "

## 2024-03-28 NOTE — Plan of Care (Signed)
  Problem: Clinical Measurements: Goal: Will remain free from infection Outcome: Progressing Goal: Cardiovascular complication will be avoided Outcome: Progressing   Problem: Coping: Goal: Level of anxiety will decrease Outcome: Progressing

## 2024-03-28 NOTE — Evaluation (Signed)
 Occupational Therapy Evaluation Patient Details Name: [name] MRN: [id number] DOB: 1931-12-09 Today's Date: 03/28/2024   History of Present Illness   89 yo admitted after a fall OOB resulting in R hip preiprosthetic fx, non-op mgmnt, WBAT with RW, no active abduction;  with medical history significant of retention, hyperlipidemia, atrial fibrillation,  s/p aortic valve replacement, CVA, dementia, breast cancer     Clinical Impressions Son was providing nearly 24 hour care of pt in her home prior to admission and assisting with ADLs and IADLs quite a bit. Pt used a RW in the home, son supervised her on stairs and in and out of his vehicle. Pt presents with generalized weakness, L hip pain and impaired cognition. She needs +2 total assist for bed level mobility and lateral scooting along EOB. She was not able to stand with 2 person assist to transfer. Pt is able to wipe her face and drink from a cup with a straw when placed in her hand. She is otherwise dependent. Began educating pt's son in how to assist pt with bed mobility for ADLs and skin care. He is agreeable to to hospital bed, hoyer lift and w/c so he may continue to care for pt at home. He is declining SNF for rehab. Recommending maximum home health services.      If plan is discharge home, recommend the following:   Two people to help with walking and/or transfers;Two people to help with bathing/dressing/bathroom;Assistance with cooking/housework;Assistance with feeding;Direct supervision/assist for medications management;Direct supervision/assist for financial management;Assist for transportation;Help with stairs or ramp for entrance     Functional Status Assessment   Patient has had a recent decline in their functional status and/or demonstrates limited ability to make significant improvements in function in a reasonable and predictable amount of time     Equipment Recommendations   Wheelchair (measurements  OT);Wheelchair cushion (measurements OT);Hospital bed;Hoyer lift (drop arm commode)     Recommendations for Other Services         Precautions/Restrictions   Precautions Precautions: Fall;Other (comment) Recall of Precautions/Restrictions: Impaired Precaution/Restrictions Comments: R hip no active abduction Restrictions Weight Bearing Restrictions Per Provider Order: Yes RLE Weight Bearing Per Provider Order: Weight bearing as tolerated     Mobility Bed Mobility Overal bed mobility: Needs Assistance Bed Mobility: Supine to Sit, Sit to Supine     Supine to sit: +2 for physical assistance, Total assist Sit to supine: +2 for physical assistance, Total assist   General bed mobility comments: Used Bed pad and helicopter technique; incr time at beginning to use HOB elevation (very slowly, with monitoring of grimace) to help with trunk elevation    Transfers Overall transfer level: Needs assistance Equipment used: 2 person hand held assist Transfers: Sit to/from Stand, Bed to chair/wheelchair/BSC Sit to Stand: +2 physical assistance, Total assist          Lateral/Scoot Transfers: +2 physical assistance, Total assist General transfer comment: Stood from EOB with 2 person bilateral support and assist; Used bed pad to help scoot to [facility] in simulated lateral scoot      Balance Overall balance assessment: Needs assistance   Sitting balance-Leahy Scale: Fair Sitting balance - Comments: initially needing up to mod assist and support; once stable, able to sit EOB with CGA, and particpate in light ADL     Standing balance-Leahy Scale: Zero Standing balance comment: 2 person Total A, flexed posture  ADL either performed or assessed with clinical judgement   ADL                                         General ADL Comments: pt able to bring washcloth to face and cup with straw to mouth to drink, otherwise dependent with  minimal functional use of B UEs     Vision Ability to See in Adequate Light: 0 Adequate Patient Visual Report: No change from baseline       Perception         Praxis         Pertinent Vitals/Pain Pain Assessment Pain Assessment: Faces Faces Pain Scale: Hurts worst Pain Location: R hip with movement Pain Descriptors / Indicators: Grimacing, Guarding, Moaning Pain Intervention(s): Monitored during session, Repositioned, Ice applied, Premedicated before session     Extremity/Trunk Assessment Upper Extremity Assessment Upper Extremity Assessment: Generalized weakness;Right hand dominant   Lower Extremity Assessment Lower Extremity Assessment: Defer to PT evaluation RLE Deficits / Details: Noting significant incr in R hip pain with any movemetn of RLE, including movement at lower leg RLE: Unable to fully assess due to pain   Cervical / Trunk Assessment Cervical / Trunk Assessment: Other exceptions (weakness)   Communication Communication Communication: Impaired Factors Affecting Communication: Hearing impaired;Difficulty expressing self;Reduced clarity of speech   Cognition Arousal: Alert Behavior During Therapy: Flat affect Cognition: History of cognitive impairments             OT - Cognition Comments: pt with minimal verbalization, follows one step commands inconsistently in context of activity                 Following commands: Impaired Following commands impaired: Follows one step commands with increased time, Follows one step commands inconsistently     Cueing  General Comments   Cueing Techniques: Verbal cues;Gestural cues;Tactile cues;Visual cues  Son, Oneil, present and helpful; reports confidence in ability to manage at home; Will need another PT/OT session for family education in caregiving; We covered positioning and repositioning for pressure relief, and general concepts for helping pt move; he was engaged in session   Exercises      Shoulder Instructions      Home Living Family/patient expects to be discharged to:: Private residence Living Arrangements: Alone Available Help at Discharge: Family;Available PRN/intermittently (son almost all the time) Type of Home: House Home Access: Stairs to enter [employer] of Steps: 3 Entrance Stairs-Rails: None Home Layout: One level     Bathroom Shower/Tub: Producer, Television/film/video: Standard     Home Equipment: Agricultural Consultant (2 wheels)          Prior Functioning/Environment Prior Level of Function : Needs assist             Mobility Comments: Uses RW for most ambulation; son supervises walking outdoors and stairs ADLs Comments: son assists with IADLs, takes her to get her hair done weekly, assisted with managing brief,  bathing and dressing, takes her out to eat    OT Problem List: Decreased strength;Impaired balance (sitting and/or standing);Pain;Decreased cognition;Decreased knowledge of precautions;Decreased knowledge of use of DME or AE   OT Treatment/Interventions: Self-care/ADL training;DME and/or AE instruction;Therapeutic activities;Patient/family education;Balance training      OT Goals(Current goals can be found in the care plan section)   Acute Rehab OT Goals OT Goal Formulation: With family Time  For Goal Achievement: 04/11/24 Potential to Achieve Goals: Good ADL Goals Additional ADL Goal #1: Caregiver will be knowledgeable in assisting pt with bed mobility adhering to no abduction of R LE for hoyer lift, pressure relief and pericare. Additional ADL Goal #2: Caregiver will be knowledgeable in assisting pt with ADL at bed level and sitting. Additional ADL Goal #3: Caregiver will be knowledgeable in use of sling for lift transfers.   OT Frequency:  Min 2X/week    Co-evaluation PT/OT/SLP Co-Evaluation/Treatment: Yes Reason for Co-Treatment: Complexity of the patient's impairments (multi-system involvement);For  patient/therapist safety   OT goals addressed during session: ADL's and self-care      AM-PAC OT 6 Clicks Daily Activity     Outcome Measure Help from another person eating meals?: A Lot Help from another person taking care of personal grooming?: A Lot Help from another person toileting, which includes using toliet, bedpan, or urinal?: Total Help from another person bathing (including washing, rinsing, drying)?: Total Help from another person to put on and taking off regular upper body clothing?: Total Help from another person to put on and taking off regular lower body clothing?: Total 6 Click Score: 8   End of Session Equipment Utilized During Treatment: Gait belt Nurse Communication: Need for lift equipment  Activity Tolerance: Patient tolerated treatment well Patient left: in bed;with call bell/phone within reach;with bed alarm set;with family/visitor present  OT Visit Diagnosis: Pain;Unsteadiness on feet (R26.81);Muscle weakness (generalized) (M62.81);Other symptoms and signs involving cognitive function                Time: 9055-8974 OT Time Calculation (min): 41 min Charges:  OT General Charges $OT Visit: 1 Visit OT Evaluation $OT Eval Moderate Complexity: 1 Mod  Mliss HERO, OTR/L Acute Rehabilitation Services Office: [phone]   Kennth Mliss Helling 03/28/2024, 12:10 PM

## 2024-03-29 DIAGNOSIS — W19XXXA Unspecified fall, initial encounter: Secondary | ICD-10-CM | POA: Diagnosis not present

## 2024-03-29 DIAGNOSIS — Y92009 Unspecified place in unspecified non-institutional (private) residence as the place of occurrence of the external cause: Secondary | ICD-10-CM | POA: Diagnosis not present

## 2024-03-29 DIAGNOSIS — Z96649 Presence of unspecified artificial hip joint: Secondary | ICD-10-CM | POA: Diagnosis not present

## 2024-03-29 DIAGNOSIS — M978XXA Periprosthetic fracture around other internal prosthetic joint, initial encounter: Secondary | ICD-10-CM | POA: Diagnosis not present

## 2024-03-29 MED ORDER — TRAMADOL HCL 50 MG PO TABS
50.0000 mg | ORAL_TABLET | ORAL | Status: DC | PRN
  Administered 2024-03-29 – 2024-03-30 (×4): 50 mg via ORAL
  Filled 2024-03-29 (×4): qty 1

## 2024-03-29 MED ORDER — TRAMADOL HCL 50 MG PO TABS
50.0000 mg | ORAL_TABLET | ORAL | Status: DC | PRN
  Administered 2024-03-29: 50 mg via ORAL
  Filled 2024-03-29: qty 1

## 2024-03-29 NOTE — Progress Notes (Signed)
 Occupational Therapy Treatment Patient Details Name: [name] MRN: [id number] DOB: [birth year] Today's Date: 03/29/2024   History of present illness 89 yo admitted after a fall OOB resulting in R hip preiprosthetic fx, non-op mgmnt, WBAT with RW, no active abduction;  with medical history significant of retention, hyperlipidemia, atrial fibrillation,  s/p aortic valve replacement, CVA, dementia, breast cancer   OT comments  Focus of session on educating pt's son in how to assist pt with bed mobility for linen change, ADLs and to assist pt with sitting EOB. Instructed son in importance of floating heels and rolling pt every 2 hours to maintain skin integrity. Reinforced no active abduction of R LE. Educated son in how to place lift pad for hoyer transfers to w/c or recliner at home. Son denies need for hospital bed and has w/c at home. Recommending HHOT and home health aide.       If plan is discharge home, recommend the following:  Two people to help with walking and/or transfers;Two people to help with bathing/dressing/bathroom;Assistance with cooking/housework;Assistance with feeding;Direct supervision/assist for medications management;Direct supervision/assist for financial management;Assist for transportation;Help with stairs or ramp for entrance   Equipment Recommendations  Hoyer lift    Recommendations for Other Services      Precautions / Restrictions Precautions Precautions: Fall Recall of Precautions/Restrictions: Impaired Precaution/Restrictions Comments: R hip no active abduction Restrictions Weight Bearing Restrictions Per Provider Order: Yes RLE Weight Bearing Per Provider Order: Weight bearing as tolerated       Mobility Bed Mobility Overal bed mobility: Needs Assistance Bed Mobility: Supine to Sit, Rolling, Sit to Supine Rolling: Total assist   Supine to sit: Total assist Sit to supine: Total assist   General bed mobility comments: use of bed pad to rotate  pt to and from EOB with support of LEs and trunk, educated son in no active abduction of R LE, used bed pad to assist pt to roll for placement of lift pad and educate son in how to change linens and place brief with tapes    Transfers Overall transfer level: Needs assistance Equipment used: Ambulation equipment used               General transfer comment: maximove bed<>chair with son participating Transfer via Lift Equipment: Maximove   Balance   Sitting-balance support: Feet supported, Bilateral upper extremity supported, Single extremity supported Sitting balance-Leahy Scale: Fair                                     ADL either performed or assessed with clinical judgement   ADL                                         General ADL Comments: Focus of session on educating son in how to roll pt, assist pt supine<>sit, pull up in bed and lift with maximove simulating hoyer lift pad.    Extremity/Trunk Assessment              Vision       Perception     Praxis     Communication Communication Communication: Impaired Factors Affecting Communication: Hearing impaired;Difficulty expressing self;Reduced clarity of speech   Cognition Arousal: Alert Behavior During Therapy: Anxious, Flat affect Cognition: History of cognitive impairments  OT - Cognition Comments: pt stating, Help me frequently during session                 Following commands: Impaired Following commands impaired: Follows one step commands inconsistently      Cueing   Cueing Techniques: Verbal cues, Gestural cues, Tactile cues  Exercises      Shoulder Instructions       General Comments Pt's son, Oneil was present with therapists educating pt's son on bed rolling, placement of bed pads and maxi move sling placement, and how to use hoyer lift.    Pertinent Vitals/ Pain       Pain Assessment Pain Assessment: Faces Faces Pain Scale: Hurts  whole lot Pain Location: R LE with movement Pain Descriptors / Indicators: Grimacing, Guarding, Moaning Pain Intervention(s): Repositioned, Patient requesting pain meds-RN notified  Home Living                                          Prior Functioning/Environment              Frequency  Min 2X/week        Progress Toward Goals  OT Goals(current goals can now be found in the care plan section)  Progress towards OT goals: Progressing toward goals  Acute Rehab OT Goals OT Goal Formulation: With family Time For Goal Achievement: 04/11/24 Potential to Achieve Goals: Good  Plan      Co-evaluation      Reason for Co-Treatment: Complexity of the patient's impairments (multi-system involvement);For patient/therapist safety PT goals addressed during session: Mobility/safety with mobility;Proper use of DME OT goals addressed during session: ADL's and self-care      AM-PAC OT 6 Clicks Daily Activity     Outcome Measure   Help from another person eating meals?: A Lot Help from another person taking care of personal grooming?: A Lot Help from another person toileting, which includes using toliet, bedpan, or urinal?: Total Help from another person bathing (including washing, rinsing, drying)?: Total Help from another person to put on and taking off regular upper body clothing?: Total Help from another person to put on and taking off regular lower body clothing?: Total 6 Click Score: 8    End of Session    OT Visit Diagnosis: Pain;Unsteadiness on feet (R26.81);Muscle weakness (generalized) (M62.81);Other symptoms and signs involving cognitive function   Activity Tolerance Patient tolerated treatment well   Patient Left in bed;with call bell/phone within reach;with bed alarm set;with family/visitor present   Nurse Communication Patient requests pain meds        Time: 8844-8748 OT Time Calculation (min): 56 min  Charges: OT General Charges $OT  Visit: 1 Visit OT Treatments $Therapeutic Activity: 23-37 mins  [name], OTR/L Acute Rehabilitation Services Office: [phone]   [name] 03/29/2024, 1:23 PM

## 2024-03-29 NOTE — Progress Notes (Signed)
 Physical Therapy Treatment Patient Details Name: [name] MRN: [id number] DOB: [birth year] Today's Date: 03/29/2024   History of Present Illness 89 yo admitted after a fall OOB resulting in R hip preiprosthetic fx, non-op mgmnt, WBAT with RW, no active abduction;  with medical history significant of retention, hyperlipidemia, atrial fibrillation,  s/p aortic valve replacement, CVA, dementia, breast cancer    PT Comments  Pt and pt's son participated in education on appropriate use of hoyer Carpenter, with therapist providing step by step instructions on pad placement, optimal positioning, and mechanics of hoyer itself. Therapist demonstrated while pt's son asked follow up questions. Therapist offered to have son attempt to assess carry over of information with son respectfully declining. Pt requires total A for rolling for pad placement and total A via hoyer for transfer OOB to recliner. Pt's son was educated on rolling patient every 2 hours and to minimize amount of time hoyer sling is left under patient to reduce risk of pressure sores. PT will continue to treat pt while she is admitted. Continuing to recommend HHPT at discharge to address remaining mobility deficits and optimize return to PLOF.    If plan is discharge home, recommend the following: A lot of help with walking and/or transfers;Two people to help with walking and/or transfers;A lot of help with bathing/dressing/bathroom;Help with stairs or ramp for entrance;Supervision due to cognitive status   Can travel by private vehicle        Equipment Recommendations  [name] (pt's son reports pt has WC and he does not think he'll need a hospital bed despite encouragement from therapist)    Recommendations for Other Services       Precautions / Restrictions Precautions Precautions: Fall;Other (comment) Recall of Precautions/Restrictions: Impaired Precaution/Restrictions Comments: R hip no active abduction Restrictions Weight  Bearing Restrictions Per Provider Order: Yes RLE Weight Bearing Per Provider Order: Weight bearing as tolerated     Mobility  Bed Mobility Overal bed mobility: Needs Assistance Bed Mobility: Rolling       Sit to supine: Total assist, +2 for physical assistance   General bed mobility comments: Pt rolled L and R for bed pad and maxi pad placement and removal with total A. Step by step VC for sequencing, educating pt's son on appropriate positioning, placement of bed pad and placing pillows or rolled towels on lateral aspect of legs for comfort. For sit to supine, pt requires total A for BLE and trunk management.    Transfers Overall transfer level: Needs assistance   Transfers: Bed to chair/wheelchair/BSC             General transfer comment: Pt completed transfer from bed to recliner, and back to bed, total A via maxi move. Increased time to complete. Transfer via Carpenter Equipment: Maximove  Ambulation/Gait                   Stairs             Wheelchair Mobility     Tilt Bed    Modified Rankin (Stroke Patients Only)       Balance Overall balance assessment: Needs assistance Sitting-balance support: Bilateral upper extremity supported, Feet unsupported Sitting balance-Leahy Scale: Poor Sitting balance - Comments: pt reliant on maximal physical assitance from therapist supporting back as pt demonstrating significant postero-lateral lean to the L  Communication Communication Communication: Impaired Factors Affecting Communication: Hearing impaired;Difficulty expressing self;Reduced clarity of speech  Cognition Arousal: Alert Behavior During Therapy: Flat affect   PT - Cognitive impairments: History of cognitive impairments                       PT - Cognition Comments: Pt has history of dementia Following commands: Impaired Following commands impaired: Follows one step commands  inconsistently    Cueing Cueing Techniques: Verbal cues, Gestural cues, Tactile cues  Exercises      General Comments General comments (skin integrity, edema, etc.): Pt's son, [name] was present with therapists educating pt's son on bed rolling, placement of bed pads and maxi move sling placement, and how to use hoyer Carpenter.      Pertinent Vitals/Pain Pain Assessment Pain Assessment: Faces Faces Pain Scale: Hurts worst Pain Location: R hip with movement Pain Descriptors / Indicators: Grimacing, Guarding, Moaning Pain Intervention(s): Limited activity within patient's tolerance, Monitored during session    Home Living                          Prior Function            PT Goals (current goals can now be found in the care plan section) Acute Rehab PT Goals Patient Stated Goal: Did not state PT Goal Formulation: With family Time For Goal Achievement: 04/11/24 Potential to Achieve Goals: Fair Progress towards PT goals: Not progressing toward goals - comment    Frequency    Min 2X/week      PT Plan      Co-evaluation PT/OT/SLP Co-Evaluation/Treatment: Yes Reason for Co-Treatment: Complexity of the patient's impairments (multi-system involvement);Necessary to address cognition/behavior during functional activity;For patient/therapist safety;To address functional/ADL transfers PT goals addressed during session: Mobility/safety with mobility;Proper use of DME        AM-PAC PT 6 Clicks Mobility   Outcome Measure  Help needed turning from your back to your side while in a flat bed without using bedrails?: Total Help needed moving from lying on your back to sitting on the side of a flat bed without using bedrails?: Total Help needed moving to and from a bed to a chair (including a wheelchair)?: Total Help needed standing up from a chair using your arms (e.g., wheelchair or bedside chair)?: Total Help needed to walk in hospital room?: Total Help needed climbing  3-5 steps with a railing? : Total 6 Click Score: 6    End of Session Equipment Utilized During Treatment: Other (comment) (bed pads and maxi move) Activity Tolerance: Patient limited by pain Patient left: in bed;with call bell/phone within reach;with bed alarm set Nurse Communication: Mobility status;Patient requests pain meds PT Visit Diagnosis: Other abnormalities of gait and mobility (R26.89);History of falling (Z91.81);Pain Pain - Right/Left: Right Pain - part of body: Hip     Time: 1220-1252 PT Time Calculation (min) (ACUTE ONLY): 32 min  Charges:    $Therapeutic Activity: 23-37 mins PT General Charges $$ ACUTE PT VISIT: 1 Visit                     Leontine Hilt DPT Acute Rehab Services [phone] Prefer contact via chat    Mckaylie Vasey B Elleah Hemsley 03/29/2024, 1:20 PM

## 2024-03-29 NOTE — Progress Notes (Signed)
 " Progress Note    [name]   FMW:[id number]  DOB: [birth year]  DOA: 03/27/2024     1 PCP: [name], NP  Initial CC: Fall at home  Hospital Course: [name] is a 89 y.o. female with medical history significant of hyperlipidemia, atrial fibrillation,  s/p aortic valve replacement, CVA, dementia, breast cancer presents with a fall with right-sided hip pain.   She experienced a fall while getting out of bed, which was unwitnessed by her son. She was not using her walker at the time and was wearing socks on a hardwood floor, which may have contributed to the fall. Due to her dementia, she does not recall the fall.   She has a history of bilateral hip replacements, with one side having been replaced twice.   She hit her head with the fall but unclear if she lost consciousness   She has dementia, which affects her memory and recognition of family members.   X-ray of the pelvis noted acute large nondisplaced periprosthetic fracture of the proximal right femur.  CT scan of the head did not note any acute abnormality in the right parietal scalp hematoma.  CT scan of the cervical spine did not reveal any acute fracture.  Orthopedics had been consulted for need of surgical correction.  Assessment and Plan:   Periprosthetic right hip fracture secondary to fall Acute.  Patient presents after fall at home that was unwitnessed.  She lives at home with her son and utilizes a rolling walker at baseline.  X-rays revealed a periprosthetic fracture of the right proximal femur. - WBAT to RLE, no abduction - outpt follow up with ortho, Dr. Edna in 2 weeks - continue pain control; did not tolerate vicodin 2/2 itching; trying tramadol  today - Evaluated by physical therapy, plan is for discharging home given her dementia and likely will do better at home -Awaiting DME to be confirmed prior to discharge home   Atrial fibrillation Atrial fibrillation patient rate controlled on  metoprolol . - Continue metoprolol  and Eliquis  as no surgical procedures   Heart failure with preserved ejection fraction Patient appears to be euvolemic on physical exam.  Last ejection fraction noted to be 60 to 65% with indeterminate diastolic parameters on last check 09/24/2022. - Strict I&O's and daily - Continue beta-blocker, ARB, and Lasix   Essential hypertension Blood pressures noted to be elevated up to 166/82 - Continue metoprolol , furosemide , and pharmacy substitution of irbesartan    Macrocytic anemia Hemoglobin noted to be 13.4 with MCV 102.3 and MCH 34.2.  Hemoglobin appears near patient's baseline. - Monitor   Hyperlipidemia - Continue atorvastatin    Chronic kidney disease stage IIIa Creatinine 1.05 which appears near patient's baseline. - Continue to monitor   Dementia - Delirium precautions  Interval History:  No events overnight although did not tolerate hydrocodone  yesterday.  Caused itching.  Antimicrobials:   Consultants:  Ortho  Procedures:    DVT prophylaxis:   apixaban  (ELIQUIS ) tablet 5 mg   Code Status:   Code Status: Full Code  Barriers to discharge: none Therapy evaluation: PT Orders: Active   PT Follow up Rec: Home Health Pt1/28/2026 1100  Disposition Plan:  Home Status is: Obs  Mobility Assessment (Last 72 Hours)     Mobility Assessment     Row Name 03/29/24 0954 03/28/24 1942 03/28/24 1200 03/28/24 1100 03/28/24 0900   Does the patient have exclusion criteria? No- Perform mobility assessment No- Perform mobility assessment -- -- No- Perform mobility assessment  What is the highest level of mobility based on the mobility assessment? Level 3 (Stands with assistance) - Balance while standing  and cannot march in place Level 3 (Stands with assistance) - Balance while standing  and cannot march in place Level 3 (Stands with assistance) - Balance while standing  and cannot march in place Level 1 (Bedfast) - Unable to balance while  sitting on edge of bed Level 1 (Bedfast) - Unable to balance while sitting on edge of bed   Is the above level different from baseline mobility prior to current illness? Yes - Recommend PT order -- -- -- --    Row Name 03/27/24 2000 03/27/24 1522         Does the patient have exclusion criteria? No- Perform mobility assessment No- Perform mobility assessment      What is the highest level of mobility based on the mobility assessment? Level 1 (Bedfast) - Unable to balance while sitting on edge of bed Level 1 (Bedfast) - Unable to balance while sitting on edge of bed      Is the above level different from baseline mobility prior to current illness? Yes - Recommend PT order Yes - Recommend PT order         Diet: Diet Orders (From admission, onward)     Start     Ordered   03/27/24 1145  Diet Heart Room service appropriate? Yes; Fluid consistency: Thin  Diet effective now       Question Answer Comment  Room service appropriate? Yes   Fluid consistency: Thin      03/27/24 1144            Objective: Blood pressure (!) 135/58, pulse 83, temperature 98 F (36.7 C), resp. rate 16, height 5' 3 (1.6 m), weight 64.9 kg, SpO2 98%.  Examination:  Physical Exam Constitutional:      Comments: Pleasant elderly woman resting in bed in no distress with obvious dementia  HENT:     Head: Normocephalic and atraumatic.     Mouth/Throat:     Mouth: Mucous membranes are moist.  Eyes:     Extraocular Movements: Extraocular movements intact.  Cardiovascular:     Rate and Rhythm: Normal rate and regular rhythm.  Pulmonary:     Effort: Pulmonary effort is normal. No respiratory distress.     Breath sounds: Normal breath sounds. No wheezing.  Abdominal:     General: Bowel sounds are normal. There is no distension.     Palpations: Abdomen is soft.     Tenderness: There is no abdominal tenderness.  Musculoskeletal:        General: Normal range of motion.     Cervical back: Normal range of  motion and neck supple.  Skin:    General: Skin is warm and dry.  Neurological:     General: No focal deficit present.  Psychiatric:        Mood and Affect: Mood normal.      Data Reviewed: No results found for this or any previous visit (from the past 24 hours).  I have reviewed pertinent nursing notes, vitals, labs, and images as necessary. I have ordered labwork to follow up on as indicated.  I have reviewed the last notes from staff over past 24 hours. I have discussed patient's care plan and test results with nursing staff, CM/SW, and other staff as appropriate.  Old records reviewed in assessment of this patient    LOS: 1 day   [name]  Bonnie Schreckengost, MD [facility] 03/29/2024, 12:51 PM "

## 2024-03-29 NOTE — Progress Notes (Signed)
 Patient with c/o mild itching earlier this shift. Son questioned medication for pain as Pt has an allergy  to codeine (causes her extreme itching), this nurse clarified with son on the medication Norco that she had rec'd yesterday and that for some it does cause itching--son prefers to not have continued use of this medication. Last dose was 1/28 at 1856, itching seemed to resolve apx 0100. Pain managed this shift with rest, ice and positioning. NAD noted, safety maintained. Son remains at bedside, call light in reach, bed alarm on and functioning.

## 2024-03-29 NOTE — TOC Initial Note (Signed)
 Transition of Care [facility]) - Initial/Assessment Note    Patient Details  Name: [name] MRN: [id number] Date of Birth: [birth year]  [facility]) CM/SW Contact:    Rosalva Jon Bloch, RN Phone Number: 03/29/2024, 12:40 PM  Clinical Narrative:                 Presents after a fall. Suffered R hip fx. From home with son. Plan: non-operative management with WBAT with RW .  Pt/son agreeable to home health services. DME: hoyer lift , referral made with [employer] equipment will be delivered to pt's home.  Pt will require PTAR for transportation to home.  ICM TEAM FOLLOWING AND WILL ASSIST WITH NEEDS.... Expected Discharge Plan: Home w Home Health Services Barriers to Discharge: Continued Medical Work up   Patient Goals and CMS Choice     Choice offered to / list presented to : Adult Children      Expected Discharge Plan and Services     Post Acute Care Choice: Home Health, Durable Medical Equipment                   DME Arranged: Other see comment (hoyer lift and pad) DME Agency: [employer] Date DME Agency Contacted: 03/29/24 Time DME Agency Contacted: 1237 Representative spoke with at DME Agency: [place] HH Arranged: PT, OT, Nurse's Aide          Prior Living Arrangements/Services   Lives with:: Adult Children Patient language and need for interpreter reviewed:: Yes        Need for Family Participation in Patient Care: Yes (Comment) Care giver support system in place?: Yes (comment)   Criminal Activity/Legal Involvement Pertinent to Current Situation/Hospitalization: No - Comment as needed  Activities of Daily Living   ADL Screening (condition at time of admission) Independently performs ADLs?: No Does the patient have a NEW difficulty with bathing/dressing/toileting/self-feeding that is expected to last >3 days?: Yes (Initiates electronic notice to provider for possible OT consult) Does the patient have a NEW difficulty with getting  in/out of bed, walking, or climbing stairs that is expected to last >3 days?: Yes (Initiates electronic notice to provider for possible PT consult) Does the patient have a NEW difficulty with communication that is expected to last >3 days?: No Is the patient deaf or have difficulty hearing?: No Does the patient have difficulty seeing, even when wearing glasses/contacts?: No Does the patient have difficulty concentrating, remembering, or making decisions?: No  Permission Sought/Granted                  Emotional Assessment Appearance:: Appears stated age     Orientation: : Oriented to Self Alcohol / Substance Use: Not Applicable Psych Involvement: No (comment)  Admission diagnosis:  DOE (dyspnea on exertion) [R06.09] Permanent atrial fibrillation (HCC) [I48.21] History of CVA (cerebrovascular accident) [Z86.73] Essential hypertension [I10] Closed fracture of right hip, initial encounter (HCC) [S72.001A] Periprosthetic fracture of proximal end of femur [F02.8XXA, Z96.649] Congestive heart failure, unspecified HF chronicity, unspecified heart failure type [facility]) [I50.9] Patient Active Problem List   Diagnosis Date Noted   Periprosthetic fracture of proximal end of femur 03/27/2024   Fall at home, initial encounter 03/27/2024   Heart failure with preserved ejection fraction (HCC) 03/27/2024   Dementia with behavioral disturbance (HCC) 03/27/2024   Cerebral atrophy 07/02/2022   S/P laparoscopic cholecystectomy 01/25/2022   Dyslipidemia 01/24/2022   Peripheral vascular disease, unspecified 12/25/2021   CVA (cerebral vascular accident) (HCC) 06/24/2020  Hypertensive urgency 06/24/2020   Cough variant asthma 01/22/2019   Upper airway cough syndrome 08/23/2018   Chronic right shoulder pain 06/15/2017   Shoulder arthritis 06/15/2017   CKD (chronic kidney disease) stage 3, GFR 30-59 ml/min (HCC) 09/08/2016   Carotid stenosis 05/05/2016   Injury of left ankle 03/11/2016   Elbow  pain, right 10/10/2013   Cough 09/12/2013   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   Long term (current) use of anticoagulants 10/02/2012   Overactive bladder 09/11/2012   Weakness generalized 09/11/2012   Atrial fibrillation (HCC) 09/04/2012   S/P aortic valve replacement with bioprosthetic valve 08/30/2012   Aortic valve stenosis, critical 08/28/2012   Normal coronary arteries-6/14 08/15/2012   DOE (dyspnea on exertion) 08/15/2012   Chest pain-exertional 08/15/2012   Aortic stenosis- critical AS at cath 08/14/12, with surgery 07/20/2012   History of total hip arthroplasty 06/29/2012   Constipation 06/06/2012   Insomnia 06/06/2012   Essential hypertension 06/06/2012   Osteoporosis 06/06/2012   GERD (gastroesophageal reflux disease) 06/06/2012   Laryngeal spasm 12/25/2010   History of breast cancer 09/29/1995   PCP:  [name], NP Pharmacy:   [facility] [address], [address] [place] [address] Phone: [phone] Fax: [phone]  [id number] - [place], [place] - [address] AT [facility] OF [address] [place] [address] Phone: [phone] Fax: [phone]     Social Drivers of Health (SDOH) Social History: SDOH Screenings   Food Insecurity: No Food Insecurity (03/27/2024)  Housing: Low Risk (03/27/2024)  Transportation Needs: No Transportation Needs (03/27/2024)  Utilities: Not At Risk (03/27/2024)  Depression (PHQ2-9): Medium Risk (09/19/2023)  Social Connections: Socially Isolated (03/27/2024)  Tobacco Use: Low Risk (03/27/2024)   SDOH Interventions: Social Connections Interventions: [employer] Provided, Inpatient TOC   Readmission Risk Interventions     No data to display

## 2024-03-30 ENCOUNTER — Other Ambulatory Visit (HOSPITAL_COMMUNITY): Payer: Self-pay

## 2024-03-30 DIAGNOSIS — M978XXA Periprosthetic fracture around other internal prosthetic joint, initial encounter: Secondary | ICD-10-CM | POA: Diagnosis not present

## 2024-03-30 DIAGNOSIS — Z96649 Presence of unspecified artificial hip joint: Secondary | ICD-10-CM | POA: Diagnosis not present

## 2024-03-30 MED ORDER — ACETAMINOPHEN ER 650 MG PO TBCR: 650.0000 mg | EXTENDED_RELEASE_TABLET | Freq: Four times a day (QID) | ORAL | Status: AC | PRN

## 2024-03-30 MED ORDER — TRAMADOL HCL 50 MG PO TABS
50.0000 mg | ORAL_TABLET | ORAL | 0 refills | Status: DC | PRN
  Filled 2024-03-30: qty 56, 7d supply, fill #0

## 2024-03-30 NOTE — Progress Notes (Signed)
 Occupational Therapy Treatment Patient Details Name: [name] MRN: [id number] DOB: 11-28-1931 Today's Date: 03/30/2024   History of present illness 89 yo admitted after a fall OOB resulting in R hip periprosthetic fx, non-op mgmnt, WBAT with RW, no active abduction;  with medical history significant of retention, hyperlipidemia, atrial fibrillation,  s/p aortic valve replacement, CVA, dementia, breast cancer   OT comments  Pt with more lethargy than previous session. Pt stating she was hungry. Assisted to eat peaches and drink water from straw, but not alert enough to continue eating. Total assist for rolling with use of bed pad. Son not currently in room for family education. Continue to recommend HHOT and home health aide.      If plan is discharge home, recommend the following:  Two people to help with walking and/or transfers;Two people to help with bathing/dressing/bathroom;Assistance with cooking/housework;Assistance with feeding;Direct supervision/assist for medications management;Direct supervision/assist for financial management;Assist for transportation;Help with stairs or ramp for entrance   Equipment Recommendations  Hoyer lift    Recommendations for Other Services      Precautions / Restrictions Precautions Precautions: Fall Recall of Precautions/Restrictions: Impaired Precaution/Restrictions Comments: R hip no active abduction Restrictions Weight Bearing Restrictions Per Provider Order: Yes RLE Weight Bearing Per Provider Order: Weight bearing as tolerated       Mobility Bed Mobility Overal bed mobility: Needs Assistance Bed Mobility: Rolling Rolling: Total assist         General bed mobility comments: rolled with use of bed pad to check periarea and reposition    Transfers                         Balance                                           ADL either performed or assessed with clinical judgement   ADL Overall  ADL's : Needs assistance/impaired Eating/Feeding: Total assistance;Bed level Eating/Feeding Details (indicate cue type and reason): pt stating she was hungry, offered peaches, pt with lethargy, unsafe to continue feeding                                        Extremity/Trunk Assessment              Vision       Perception     Praxis     Communication Communication Communication: Impaired Factors Affecting Communication: Hearing impaired;Difficulty expressing self;Reduced clarity of speech   Cognition Arousal: Lethargic Behavior During Therapy: Anxious, Flat affect Cognition: History of cognitive impairments                               Following commands: Impaired Following commands impaired: Follows one step commands inconsistently      Cueing   Cueing Techniques: Verbal cues, Gestural cues, Tactile cues  Exercises      Shoulder Instructions       General Comments      Pertinent Vitals/ Pain       Pain Assessment Pain Assessment: Faces Faces Pain Scale: Hurts whole lot Pain Location: R hip with repositioning in bed Pain Descriptors / Indicators: Grimacing, Guarding, Moaning Pain Intervention(s): Monitored during session, Premedicated before session,  Repositioned  Home Living                                          Prior Functioning/Environment              Frequency  Min 2X/week        Progress Toward Goals  OT Goals(current goals can now be found in the care plan section)  Progress towards OT goals: Not progressing toward goals - comment  Acute Rehab OT Goals OT Goal Formulation: With family Time For Goal Achievement: 04/11/24 Potential to Achieve Goals: Good ADL Goals Additional ADL Goal #1: Caregiver will be knowledgeable in assisting pt with bed mobility adhering to no abduction of R LE for hoyer lift, pressure relief and pericare. Additional ADL Goal #2: Caregiver will be  knowledgeable in assisting pt with ADL at bed level and sitting. Additional ADL Goal #3: Caregiver will be knowledgeable in use of sling for lift transfers.  Plan      Co-evaluation                 AM-PAC OT 6 Clicks Daily Activity     Outcome Measure   Help from another person eating meals?: Total Help from another person taking care of personal grooming?: Total Help from another person toileting, which includes using toliet, bedpan, or urinal?: Total Help from another person bathing (including washing, rinsing, drying)?: Total Help from another person to put on and taking off regular upper body clothing?: Total Help from another person to put on and taking off regular lower body clothing?: Total 6 Click Score: 6    End of Session    OT Visit Diagnosis: Pain;Unsteadiness on feet (R26.81);Muscle weakness (generalized) (M62.81);Other symptoms and signs involving cognitive function   Activity Tolerance Patient limited by lethargy   Patient Left in bed;with call bell/phone within reach;with bed alarm set   Nurse Communication          Time: 1022-1040 OT Time Calculation (min): 18 min  Charges: OT General Charges $OT Visit: 1 Visit OT Treatments $Therapeutic Activity: 8-22 mins  Mliss HERO, OTR/L Acute Rehabilitation Services Office: [phone]   Kennth Mliss Helling 03/30/2024, 10:40 AM

## 2024-03-30 NOTE — Progress Notes (Signed)
 Physical Therapy Treatment Patient Details Name: [name] MRN: [id number] DOB: 1932-01-30 Today's Date: 03/30/2024   History of Present Illness 89 yo admitted after a fall OOB resulting in R hip periprosthetic fx, non-op mgmnt, WBAT with RW, no active abduction;  PMH: Retention, hyperlipidemia, atrial fibrillation,  s/p aortic valve replacement, CVA, dementia, breast cancer s/p mastectomy on L, lumpectomy on R.    PT Comments  Pt received in supine, lethargic and confused pt states help me repeatedly but mostly does not follow simple verbal or multimodal cues. Pt states yes when PTA asked if she wanted a sip of water, but had difficulty taking small sips, PTA had to remove straw from her mouth and gave extra time for pt to swallow. HOB fully elevated to promote improved pulmonary clearance with ~4 sips total during session. RN notified of pt severe pain/poor tolerance, pt provided with heel protection dressing and soft heel protector pads and BLE slightly repositioned for better elevation/floating of heels. BLE maintained in plantarflexion with poor tolerance for attempts at AA/PROM ankle pumps/dorsiflexion stretch (only tolerated this on LLE). Fall mats placed at bedside for pt safety at end of session and bed alarm remains set to moderate setting. Pt continues to benefit from PT services to progress toward functional mobility goals as long as family can provide consistent 24/7 supervision/assist at home.    If plan is discharge home, recommend the following: Two people to help with walking and/or transfers;A lot of help with bathing/dressing/bathroom;Help with stairs or ramp for entrance;Supervision due to cognitive status;Direct supervision/assist for medications management;Direct supervision/assist for financial management;Assistance with feeding;Assistance with cooking/housework   Can travel by private vehicle        Equipment Recommendations  Deitra lift (pt's son reports pt has WC  and he does not think he'll need a hospital bed (educated by PT on bed rec in prior session))    Recommendations for Other Services Other (comment) (rec OP palliative consult/ to follow her)     Precautions / Restrictions Precautions Precautions: Fall Recall of Precautions/Restrictions: Impaired Precaution/Restrictions Comments: R hip no active abduction Restrictions Weight Bearing Restrictions Per Provider Order: Yes RLE Weight Bearing Per Provider Order: Weight bearing as tolerated     Mobility  Bed Mobility Overal bed mobility: Needs Assistance             General bed mobility comments: Did not roll patient as she was seen by OT recently and rolled, pt lethargic and c/o severe pain (see PAIN-AD score) with any repositioning/LE ROM (even ankle repositioning/heel dressing placement) so not able to safely perform due to poor pain.    Transfers                   General transfer comment: pt not able to participate today due to severe pain and lethargy    Ambulation/Gait                   Stairs             Wheelchair Mobility     Tilt Bed    Modified Rankin (Stroke Patients Only)       Balance       Sitting balance - Comments: defer, pt not able to follow instructions for long sitting in bed  Communication Communication Communication: Impaired Factors Affecting Communication: Hearing impaired;Difficulty expressing self;Reduced clarity of speech  Cognition Arousal: Lethargic Behavior During Therapy: Anxious, Flat affect   PT - Cognitive impairments: History of cognitive impairments, No family/caregiver present to determine baseline                       PT - Cognition Comments: Pt has history of dementia, per OT her presentation today is more lethargic than in prior session. Son not present for family education (he was instructed by PT/OT in previous sessions). Pt having  difficulty taking small sips of her water due to lethargy, pt took a couple sips when more alert wtih repositioning and requesting water, but PTA had to pull straw out of her mouth to ensure she did not take too much in before swallowing. HOB at 52 degrees with sips of water Following commands: Impaired Following commands impaired: Follows one step commands inconsistently    Cueing Cueing Techniques: Verbal cues, Gestural cues, Tactile cues  Exercises Other Exercises Other Exercises: AAROM: ankle pumps x5 reps on RLE, x15 reps on LLE (gentle heel cord stretch on LLE ~30 seconds) pt does not tolerate any other RLE ROM or repositioning once heels floated/elevated more due to screaming/pain/confusion.    General Comments General comments (skin integrity, edema, etc.): BLE heel foam dressings placed for pressure relief, extra blanket applied under BLE to assist with elevation/heel floating soft heel protectors applied in addition to foam dressings to also protect her heels. Pt keeping BLE in plantarflexion throughout, poor tolerance for attempts at AA/PROM of bil heels, esp on RLE (pt screaming at any assist to reposition RLE even a couple inches)      Pertinent Vitals/Pain Pain Assessment Pain Assessment: PAINAD Breathing: normal Negative Vocalization: repeated troubled calling out, loud moaning/groaning, crying Facial Expression: facial grimacing Body Language: rigid, fists clenched, knees up, pushing/pulling away, strikes out Consolability: unable to console, distract or reassure PAINAD Score: 8 Pain Location: RLE with repositioning in bed Pain Descriptors / Indicators: Grimacing, Guarding, Moaning, Other (Comment) (screaming/calling out) Pain Intervention(s): Limited activity within patient's tolerance, Monitored during session, Repositioned, Patient requesting pain meds-RN notified, Other (comment) (mostly melted ice pack removed from her R hip to protect her skin, RN notified she can  reapply in a short while if needed; heel protection applied LE repositioned minimally)    Home Living                          Prior Function            PT Goals (current goals can now be found in the care plan section) Acute Rehab PT Goals Patient Stated Goal: Pt unable to state; son not present for education today PT Goal Formulation: With family Time For Goal Achievement: 04/11/24 Progress towards PT goals: Not progressing toward goals - comment (lethargic; family not present today (assume he went home to arrange for DC today))    Frequency    Min 2X/week      PT Plan      Co-evaluation              AM-PAC PT 6 Clicks Mobility   Outcome Measure  Help needed turning from your back to your side while in a flat bed without using bedrails?: Total Help needed moving from lying on your back to sitting on the side of a flat bed without using bedrails?: Total Help needed moving to  and from a bed to a chair (including a wheelchair)?: Total Help needed standing up from a chair using your arms (e.g., wheelchair or bedside chair)?: Total Help needed to walk in hospital room?: Total Help needed climbing 3-5 steps with a railing? : Total 6 Click Score: 6    End of Session Equipment Utilized During Treatment:  (defer OOB, see cog) Activity Tolerance: Patient limited by lethargy;Patient limited by pain Patient left: in bed;with call bell/phone within reach;with bed alarm set;Other (comment) (heels floated with foam dressings, heel protectors, HOB at 52 deg as pt took a few sips of water and need to ensure proper pulmonary clearance, covered in ~4 blankets for comfort; bil mitts re-donned (they were donned when PTA arrived)) Nurse Communication: Mobility status;Need for lift equipment;Patient requests pain meds;Other (comment) (RN notified of PTA interventions re: heels, ice removed for now (had melted), pt PAIN-AD score) PT Visit Diagnosis: Other abnormalities of gait  and mobility (R26.89);History of falling (Z91.81);Pain Pain - Right/Left: Right Pain - part of body: Hip;Leg;Knee     Time: 8863-8843 PT Time Calculation (min) (ACUTE ONLY): 20 min  Charges:    $Therapeutic Activity: 8-22 mins PT General Charges $$ ACUTE PT VISIT: 1 Visit                     Amye Grego P., PTA Acute Rehabilitation Services Secure Chat Preferred 9a-5:30pm Office: [phone]    Connell HERO [facility] 03/30/2024, 12:23 PM

## 2024-03-30 NOTE — TOC Transition Note (Addendum)
 Transition of Care [facility]) - Discharge Note   Patient Details  Name: [name] MRN: [id number] Date of Birth: 1931/11/11  Transition of Care [facility]) CM/SW Contact:  Rosalva Jon Bloch, RN Phone Number: 03/30/2024, 11:36 AM   Clinical Narrative:     Patient will DC to: home Anticipated DC date: 03/30/2024 Family notified:yes Transport by: [facility]   Per MD patient ready for DC today . RN, patient, patient's son and Hedda  [facility]  notified of DC. Hoyer lift to be delivered to pt home today per [employer]. Pt /son without RX med concerns. Post hospital f/u noted on AVS.   PTAR/Ambulance transport requested for patient.   RNCM will sign off for now as intervention is no longer needed. Please consult us  again if new needs arise.    RNCM will sign off for now as intervention is no longer needed. Please consult us  again if new needs arise.   Final next level of care: Home w Home Health Services Barriers to Discharge: No Barriers Identified   Patient Goals and CMS Choice     Choice offered to / list presented to : Adult Children      Discharge Placement         Discharge Plan and Services Additional resources added to the After Visit Summary for       Post Acute Care Choice: Home Health, Durable Medical Equipment          DME Arranged: Other see comment (hoyer lift and pad) DME Agency: [employer] Date DME Agency Contacted: 03/29/24 Time DME Agency Contacted: 1237 Representative spoke with at DME Agency: [place] HH Arranged: PT, OT, Nurse's Aide HH Agency: [facility] Date [facility] Agency Contacted: 03/30/24 Time HH Agency Contacted: 1136 Representative spoke with at [facility] Agency: [place]  Social Drivers of Health (SDOH) Interventions SDOH Screenings   Food Insecurity: No Food Insecurity (03/27/2024)  Housing: Low Risk (03/27/2024)  Transportation Needs: No Transportation Needs (03/27/2024)  Utilities: Not At Risk (03/27/2024)  Depression (PHQ2-9): Medium Risk  (09/19/2023)  Social Connections: Socially Isolated (03/27/2024)  Tobacco Use: Low Risk (03/27/2024)     Readmission Risk Interventions     No data to display

## 2024-04-02 ENCOUNTER — Inpatient Hospital Stay (HOSPITAL_COMMUNITY)
Admission: EM | Admit: 2024-04-02 | Discharge: 2024-04-06 | Disposition: A | Source: Home / Self Care | Attending: Family Medicine | Admitting: Family Medicine

## 2024-04-02 ENCOUNTER — Other Ambulatory Visit: Payer: Self-pay

## 2024-04-02 ENCOUNTER — Emergency Department (HOSPITAL_COMMUNITY)

## 2024-04-02 ENCOUNTER — Encounter (HOSPITAL_COMMUNITY): Payer: Self-pay

## 2024-04-02 DIAGNOSIS — M978XXA Periprosthetic fracture around other internal prosthetic joint, initial encounter: Secondary | ICD-10-CM

## 2024-04-02 DIAGNOSIS — N179 Acute kidney failure, unspecified: Secondary | ICD-10-CM | POA: Diagnosis not present

## 2024-04-02 DIAGNOSIS — E785 Hyperlipidemia, unspecified: Secondary | ICD-10-CM | POA: Diagnosis present

## 2024-04-02 DIAGNOSIS — Z96649 Presence of unspecified artificial hip joint: Secondary | ICD-10-CM

## 2024-04-02 DIAGNOSIS — N3000 Acute cystitis without hematuria: Secondary | ICD-10-CM | POA: Diagnosis not present

## 2024-04-02 DIAGNOSIS — I5030 Unspecified diastolic (congestive) heart failure: Secondary | ICD-10-CM | POA: Diagnosis present

## 2024-04-02 DIAGNOSIS — R296 Repeated falls: Secondary | ICD-10-CM

## 2024-04-02 DIAGNOSIS — M25551 Pain in right hip: Principal | ICD-10-CM

## 2024-04-02 DIAGNOSIS — Z8659 Personal history of other mental and behavioral disorders: Secondary | ICD-10-CM | POA: Diagnosis not present

## 2024-04-02 DIAGNOSIS — Z953 Presence of xenogenic heart valve: Secondary | ICD-10-CM

## 2024-04-02 DIAGNOSIS — I959 Hypotension, unspecified: Secondary | ICD-10-CM | POA: Diagnosis present

## 2024-04-02 DIAGNOSIS — I4821 Permanent atrial fibrillation: Secondary | ICD-10-CM

## 2024-04-02 DIAGNOSIS — I469 Cardiac arrest, cause unspecified: Secondary | ICD-10-CM

## 2024-04-02 DIAGNOSIS — G9341 Metabolic encephalopathy: Secondary | ICD-10-CM

## 2024-04-02 DIAGNOSIS — N189 Chronic kidney disease, unspecified: Secondary | ICD-10-CM | POA: Diagnosis not present

## 2024-04-02 DIAGNOSIS — I480 Paroxysmal atrial fibrillation: Secondary | ICD-10-CM | POA: Diagnosis not present

## 2024-04-02 DIAGNOSIS — Z8673 Personal history of transient ischemic attack (TIA), and cerebral infarction without residual deficits: Secondary | ICD-10-CM

## 2024-04-02 DIAGNOSIS — G9340 Encephalopathy, unspecified: Secondary | ICD-10-CM | POA: Insufficient documentation

## 2024-04-02 DIAGNOSIS — N1831 Chronic kidney disease, stage 3a: Secondary | ICD-10-CM

## 2024-04-02 DIAGNOSIS — K219 Gastro-esophageal reflux disease without esophagitis: Secondary | ICD-10-CM | POA: Diagnosis not present

## 2024-04-02 DIAGNOSIS — D539 Nutritional anemia, unspecified: Secondary | ICD-10-CM | POA: Insufficient documentation

## 2024-04-02 DIAGNOSIS — I5032 Chronic diastolic (congestive) heart failure: Secondary | ICD-10-CM

## 2024-04-02 LAB — CBC WITH DIFFERENTIAL/PLATELET
Abs Immature Granulocytes: 0.08 10*3/uL — ABNORMAL HIGH (ref 0.00–0.07)
Basophils Absolute: 0.1 10*3/uL (ref 0.0–0.1)
Basophils Relative: 1 %
Eosinophils Absolute: 0.1 10*3/uL (ref 0.0–0.5)
Eosinophils Relative: 1 %
HCT: 30.6 % — ABNORMAL LOW (ref 36.0–46.0)
Hemoglobin: 10.2 g/dL — ABNORMAL LOW (ref 12.0–15.0)
Immature Granulocytes: 1 %
Lymphocytes Relative: 17 %
Lymphs Abs: 1.9 10*3/uL (ref 0.7–4.0)
MCH: 33.3 pg (ref 26.0–34.0)
MCHC: 33.3 g/dL (ref 30.0–36.0)
MCV: 100 fL (ref 80.0–100.0)
Monocytes Absolute: 1.2 10*3/uL — ABNORMAL HIGH (ref 0.1–1.0)
Monocytes Relative: 10 %
Neutro Abs: 8.2 10*3/uL — ABNORMAL HIGH (ref 1.7–7.7)
Neutrophils Relative %: 70 %
Platelets: 199 10*3/uL (ref 150–400)
RBC: 3.06 MIL/uL — ABNORMAL LOW (ref 3.87–5.11)
RDW: 13.4 % (ref 11.5–15.5)
WBC: 11.5 10*3/uL — ABNORMAL HIGH (ref 4.0–10.5)
nRBC: 0 % (ref 0.0–0.2)

## 2024-04-02 LAB — BASIC METABOLIC PANEL WITH GFR
Anion gap: 20 — ABNORMAL HIGH (ref 5–15)
Anion gap: 12 (ref 5–15)
BUN: 37 mg/dL — ABNORMAL HIGH (ref 8–23)
BUN: 37 mg/dL — ABNORMAL HIGH (ref 8–23)
CO2: 22 mmol/L (ref 22–32)
CO2: 27 mmol/L (ref 22–32)
Calcium: 10 mg/dL (ref 8.9–10.3)
Calcium: 9.2 mg/dL (ref 8.9–10.3)
Chloride: 98 mmol/L (ref 98–111)
Chloride: 102 mmol/L (ref 98–111)
Creatinine, Ser: 1.68 mg/dL — ABNORMAL HIGH (ref 0.44–1.00)
Creatinine, Ser: 1.57 mg/dL — ABNORMAL HIGH (ref 0.44–1.00)
GFR, Estimated: 29 mL/min — ABNORMAL LOW
GFR, Estimated: 31 mL/min — ABNORMAL LOW
Glucose, Bld: 114 mg/dL — ABNORMAL HIGH (ref 70–99)
Glucose, Bld: 110 mg/dL — ABNORMAL HIGH (ref 70–99)
Potassium: 3.7 mmol/L (ref 3.5–5.1)
Potassium: 3.5 mmol/L (ref 3.5–5.1)
Sodium: 141 mmol/L (ref 135–145)
Sodium: 141 mmol/L (ref 135–145)

## 2024-04-02 LAB — URINALYSIS, ROUTINE W REFLEX MICROSCOPIC
Bilirubin Urine: NEGATIVE
Glucose, UA: NEGATIVE mg/dL
Ketones, ur: NEGATIVE mg/dL
Nitrite: NEGATIVE
Protein, ur: 30 mg/dL — AB
Specific Gravity, Urine: 1.021 (ref 1.005–1.030)
WBC, UA: >50 WBC/hpf (ref 0–5)
pH: 5 (ref 5.0–8.0)

## 2024-04-02 LAB — CBC
HCT: 26.2 % — ABNORMAL LOW (ref 36.0–46.0)
Hemoglobin: 8.9 g/dL — ABNORMAL LOW (ref 12.0–15.0)
MCH: 34.1 pg — ABNORMAL HIGH (ref 26.0–34.0)
MCHC: 34 g/dL (ref 30.0–36.0)
MCV: 100.4 fL — ABNORMAL HIGH (ref 80.0–100.0)
Platelets: 177 10*3/uL (ref 150–400)
RBC: 2.61 MIL/uL — ABNORMAL LOW (ref 3.87–5.11)
RDW: 13.4 % (ref 11.5–15.5)
WBC: 8.9 10*3/uL (ref 4.0–10.5)
nRBC: 0 % (ref 0.0–0.2)

## 2024-04-02 MED ORDER — SENNOSIDES-DOCUSATE SODIUM 8.6-50 MG PO TABS
1.0000 | ORAL_TABLET | Freq: Two times a day (BID) | ORAL | Status: DC
  Administered 2024-04-02 – 2024-04-06 (×9): 1 via ORAL
  Filled 2024-04-02 (×10): qty 1

## 2024-04-02 MED ORDER — HYDROMORPHONE HCL 1 MG/ML IJ SOLN
0.5000 mg | INTRAMUSCULAR | Status: DC | PRN
  Administered 2024-04-03 – 2024-04-06 (×3): 0.5 mg via INTRAVENOUS
  Filled 2024-04-02 (×3): qty 0.5

## 2024-04-02 MED ORDER — SODIUM CHLORIDE 0.9 % IV SOLN: 250.0000 mL | INTRAVENOUS | Status: AC

## 2024-04-02 MED ORDER — ACETAMINOPHEN 650 MG RE SUPP: 650.0000 mg | Freq: Four times a day (QID) | RECTAL | Status: DC | PRN

## 2024-04-02 MED ORDER — LACTATED RINGERS IV BOLUS
1500.0000 mL | Freq: Once | INTRAVENOUS | Status: AC
  Administered 2024-04-02: 1500 mL via INTRAVENOUS

## 2024-04-02 MED ORDER — APIXABAN 5 MG PO TABS
5.0000 mg | ORAL_TABLET | Freq: Two times a day (BID) | ORAL | Status: DC
  Administered 2024-04-02 – 2024-04-06 (×9): 5 mg via ORAL
  Filled 2024-04-02 (×10): qty 1

## 2024-04-02 MED ORDER — ACETAMINOPHEN 10 MG/ML IV SOLN
1000.0000 mg | Freq: Once | INTRAVENOUS | Status: AC
  Administered 2024-04-02: 1000 mg via INTRAVENOUS
  Filled 2024-04-02: qty 100

## 2024-04-02 MED ORDER — SODIUM CHLORIDE 0.9 % IV SOLN: 250.0000 mL | INTRAVENOUS | Status: AC | PRN

## 2024-04-02 MED ORDER — PIPERACILLIN-TAZOBACTAM 3.375 G IVPB 30 MIN
3.3750 g | Freq: Once | INTRAVENOUS | Status: AC
  Administered 2024-04-02: 3.375 g via INTRAVENOUS
  Filled 2024-04-02: qty 50

## 2024-04-02 MED ORDER — ONDANSETRON HCL 4 MG/2ML IJ SOLN: 4.0000 mg | Freq: Four times a day (QID) | INTRAMUSCULAR | Status: DC | PRN

## 2024-04-02 MED ORDER — PIPERACILLIN-TAZOBACTAM 3.375 G IVPB
3.3750 g | Freq: Three times a day (TID) | INTRAVENOUS | Status: DC
  Administered 2024-04-02 – 2024-04-04 (×6): 3.375 g via INTRAVENOUS
  Filled 2024-04-02 (×8): qty 50

## 2024-04-02 MED ORDER — ATORVASTATIN CALCIUM 40 MG PO TABS
40.0000 mg | ORAL_TABLET | Freq: Every day | ORAL | Status: DC
  Administered 2024-04-02 – 2024-04-06 (×5): 40 mg via ORAL
  Filled 2024-04-02 (×5): qty 1

## 2024-04-02 MED ORDER — ALBUTEROL SULFATE (2.5 MG/3ML) 0.083% IN NEBU: 2.5000 mg | INHALATION_SOLUTION | Freq: Four times a day (QID) | RESPIRATORY_TRACT | Status: DC | PRN

## 2024-04-02 MED ORDER — NOREPINEPHRINE 4 MG/250ML-% IV SOLN: 0.0000 ug/min | INTRAVENOUS | Status: DC

## 2024-04-02 MED ORDER — OXYCODONE HCL 5 MG PO TABS
5.0000 mg | ORAL_TABLET | ORAL | Status: DC | PRN
  Administered 2024-04-04 – 2024-04-06 (×3): 5 mg via ORAL
  Filled 2024-04-02 (×4): qty 1

## 2024-04-02 MED ORDER — LACTATED RINGERS IV BOLUS
250.0000 mL | Freq: Once | INTRAVENOUS | Status: AC
  Administered 2024-04-02: 250 mL via INTRAVENOUS

## 2024-04-02 MED ORDER — SODIUM CHLORIDE 0.9% FLUSH
3.0000 mL | Freq: Two times a day (BID) | INTRAVENOUS | Status: DC
  Administered 2024-04-02 – 2024-04-06 (×10): 3 mL via INTRAVENOUS

## 2024-04-02 MED ORDER — SODIUM CHLORIDE 0.9% FLUSH: 3.0000 mL | INTRAVENOUS | Status: DC | PRN

## 2024-04-02 MED ORDER — SODIUM CHLORIDE 0.9 % IV BOLUS
1000.0000 mL | Freq: Once | INTRAVENOUS | Status: AC
  Administered 2024-04-02: 1000 mL via INTRAVENOUS

## 2024-04-02 MED ORDER — METOPROLOL TARTRATE 25 MG PO TABS
12.5000 mg | ORAL_TABLET | Freq: Two times a day (BID) | ORAL | Status: DC
  Administered 2024-04-02: 12.5 mg via ORAL
  Filled 2024-04-02: qty 1

## 2024-04-02 MED ORDER — FOSFOMYCIN TROMETHAMINE 3 G PO PACK
3.0000 g | PACK | Freq: Once | ORAL | Status: AC
  Administered 2024-04-02: 3 g via ORAL
  Filled 2024-04-02: qty 3

## 2024-04-02 MED ORDER — ONDANSETRON HCL 4 MG PO TABS
4.0000 mg | ORAL_TABLET | Freq: Four times a day (QID) | ORAL | Status: DC | PRN
  Administered 2024-04-04: 4 mg via ORAL
  Filled 2024-04-02: qty 1

## 2024-04-02 MED ORDER — LACTATED RINGERS IV SOLN: INTRAVENOUS | Status: AC

## 2024-04-02 MED ORDER — SODIUM CHLORIDE 0.9% FLUSH
3.0000 mL | Freq: Two times a day (BID) | INTRAVENOUS | Status: DC
  Administered 2024-04-02 – 2024-04-06 (×10): 3 mL via INTRAVENOUS

## 2024-04-02 MED ORDER — ACETAMINOPHEN 325 MG PO TABS
650.0000 mg | ORAL_TABLET | Freq: Four times a day (QID) | ORAL | Status: DC | PRN
  Administered 2024-04-04 – 2024-04-05 (×2): 650 mg via ORAL
  Filled 2024-04-02 (×3): qty 2

## 2024-04-02 NOTE — Progress Notes (Signed)
 Subjective: Patient admitted this morning, see detailed H&P by Dr Lee 89 y.o. female with medical history significant of periprosthetic right hip fracture secondary to fall on medical management, recently admitted 1/30, dementia, CVA, status post aortic valve placement with bioprosthetic valve, atrial fibrillation on Eliquis , HFpEF 60 to 65%, essential hypertension, history of dementia, macrocytic anemia and CKD 3B presented to emergency department complaining of right-sided hip pain.  Per EMS patient lives with her son.  EMS called as patient was complaining about right-sided hip pain and hallucination.  Patient is currently have recent periprosthetic hip fracture treated nonoperatively, weightbearing as tolerates and discharged home with home health.  EMS reported that patient has hallucination.  She has been taking tramadol  for pain.  In the ED patient is alert oriented x 3.  Reported right-sided hip pain otherwise she does not have any other complaint.   Patient son reported patient was discharged to home for home health however due to the heavy snowbird the weekend home health not has been initiated neither patient received any home health aide and DME has been delivered to home yet.     ED Course:  At presentation to ED patient is hemodynamically stable. Lab work, CBC showing elevated creatinine 1.68, elevated anion gap 20 and elevated BUN 37.  CBC showed leukocytosis 11.5, stable H&H normal platelet count.  UA showed evidence of UTI pending urine culture.   X-ray showed acute periprosthetic fracture of of the proximal right femur with slightly more apparent fracture lines. 2. Right hip arthroplasty in place.  Vitals:   04/02/24 0645 04/02/24 0730  BP: 122/77 108/73  Pulse: (!) 59 (!) 56  Resp: (!) 23 (!) 26  Temp:    SpO2: 100% 100%      A/P  PEA cardiac arrest - Insetting of dehydration, UTI - Briefly required CPR , ROSC achieved after 1 minute - Briefly placed on vasopressor  agent, currently weaned off - PCCM was consulted, did not feel the need to transfer to ICU  Sepsis - Patient was hypotensive, required multiple fluid boluses this morning - Briefly required Levophed , currently weaned off - She does have UTI, follow urine culture results - Antibiotics switched to Zosyn  per pharmacy  Metabolic encephalopathy - Multifactorial likely from hip fracture, pain medication and UTI - Does have history of dementia   Acute metabolic encephalopathy intolerance of hallucination secondary to combination of hip fracture, pain medication and UTI History of dementia -Patient was admitted 2 days ago for periprosthetic hip fracture evaluated by orthopedic surgery recommended conservative management, weightbearing as tolerates and discharged home with home PT/OT and home health aide therapy however due to the heavy snow over the weekend home health aide and DME has not been initiated and delivered to home yet. - Further workup in the ED revealed acute on chronic periprosthetic hip fracture and acute cystitis which is contributing to hallucination also patient has baseline dementia which has been worsening. - Continue to manage for all acute problem following. -Continue delirium precaution.   Acute on chronic periprosthetic hip fracture -Patient continued to complaining of right-sided hip pain.  Taking tramadol  out at home with improvement however experiencing hallucination now. - X-ray showing Acute periprosthetic fracture of the proximal right femur with slightly more apparent fracture lines and right hip arthroplasty in place. -Patient recently admitted 2 days ago with similar complaint found to have periprosthetic hip fracture managed conservatively per orthopedic surgery and recommended weightbearing as tolerates. - Physical exam minimal right-sided hip joint pain and  neurovascularly intact. -Patient follows orthopedic surgery group [facility] with Dr.Marchweainy and same  orthopedic group would be on-call at 7 AM covering by Ozell Purchase, PA. -Discussed with Ozell Purchase, PA, he reviewed the films and recommend weightbearing as tolerated.  No abduction   Acute cystitis -UA showing evidence of UTI.  Per chart review previous urine culture growing E. coli pansensitive.  History of cephalosporin allergy . -After discussion with pharmacy giving 1 time dose of oral fosfomycin  3 g.   - Now started on IV Zosyn  per pharmacy as above   Acute kidney injury superimposed CKD stage IIIb -Elevated creatinine 1.68.  Prerenal acute kidney injury in the setting of poor oral intake.  UA showing evidence of UTI.  Pending urine sodium and creatinine. -Holding Lasix  and valsartan  in setting of AKI. -Continue LR at 100 mL/h   History of paroxysmal atrial fibrillation -Continue Eliquis  and metoprolol .   HFpEF Status post aortic valve placement with bioprosthetic valve -Holding Lasix  and valsartan  in the setting of AKI and patient is not volume overloaded. - Continue metoprolol .   History of CVA - Continue Eliquis  and Lipitor   Javon Snee S Ashlin Kreps [facility]

## 2024-04-02 NOTE — ED Notes (Signed)
 Bonnie Novas, MD  gave order to  RN stop the Levophed 

## 2024-04-02 NOTE — Progress Notes (Signed)
 Pt admitted from ED with c/o AMS, pt alert c/o of generalized pain, settled in room with family at bedside, tele monitor put on pt, safety precaution initiated, was however reassured and will continue to monitor. Obasogie-Asidi, Shauntavia Brackin Efe

## 2024-04-02 NOTE — ED Provider Notes (Signed)
 I responded to a CODE BLUE alarm.  Patient had become quite hypotensive and apparently briefly coded.  There was less than 1 minute of CPR according to staff.  On my arrival, patient is moaning and drowsy.  Blood pressures 50s systolic.  Also having a heart rate around the 50s.  No longer actively doing CPR.  IV access has been established (her IV had stopped working).  Son at the bedside states her blood pressure was low for a while and then she was unresponsive.  However now her mental status is improving and she does not appear to need emergent intubation.  She was started on Levophed  which has had a dramatic effect and now her blood pressure is quite elevated and we are titrating this back down.  I updated her attending, Dr. Drusilla.  I consulted the intensivist team, and spoke to [facility].  They will come see the patient.   Freddi Hamilton, MD 04/02/24 1141

## 2024-04-03 LAB — CBC
HCT: 25.6 % — ABNORMAL LOW (ref 36.0–46.0)
Hemoglobin: 8.5 g/dL — ABNORMAL LOW (ref 12.0–15.0)
MCH: 33.5 pg (ref 26.0–34.0)
MCHC: 33.2 g/dL (ref 30.0–36.0)
MCV: 100.8 fL — ABNORMAL HIGH (ref 80.0–100.0)
Platelets: 193 10*3/uL (ref 150–400)
RBC: 2.54 MIL/uL — ABNORMAL LOW (ref 3.87–5.11)
RDW: 13.3 % (ref 11.5–15.5)
WBC: 7.5 10*3/uL (ref 4.0–10.5)
nRBC: 0 % (ref 0.0–0.2)

## 2024-04-03 LAB — COMPREHENSIVE METABOLIC PANEL WITH GFR
ALT: 65 U/L — ABNORMAL HIGH (ref 0–44)
AST: 81 U/L — ABNORMAL HIGH (ref 15–41)
Albumin: 2.9 g/dL — ABNORMAL LOW (ref 3.5–5.0)
Alkaline Phosphatase: 63 U/L (ref 38–126)
Anion gap: 11 (ref 5–15)
BUN: 27 mg/dL — ABNORMAL HIGH (ref 8–23)
CO2: 27 mmol/L (ref 22–32)
Calcium: 8.6 mg/dL — ABNORMAL LOW (ref 8.9–10.3)
Chloride: 105 mmol/L (ref 98–111)
Creatinine, Ser: 1.14 mg/dL — ABNORMAL HIGH (ref 0.44–1.00)
GFR, Estimated: 46 mL/min — ABNORMAL LOW
Glucose, Bld: 68 mg/dL — ABNORMAL LOW (ref 70–99)
Potassium: 3.7 mmol/L (ref 3.5–5.1)
Sodium: 143 mmol/L (ref 135–145)
Total Bilirubin: 1.2 mg/dL (ref 0.0–1.2)
Total Protein: 4.9 g/dL — ABNORMAL LOW (ref 6.5–8.1)

## 2024-04-03 LAB — MAGNESIUM: Magnesium: 1.7 mg/dL (ref 1.7–2.4)

## 2024-04-03 MED ORDER — MAGNESIUM HYDROXIDE 400 MG/5ML PO SUSP
15.0000 mL | Freq: Once | ORAL | Status: AC
  Administered 2024-04-03: 15 mL via ORAL
  Filled 2024-04-03: qty 30

## 2024-04-03 MED ORDER — METOPROLOL TARTRATE 12.5 MG HALF TABLET
12.5000 mg | ORAL_TABLET | Freq: Two times a day (BID) | ORAL | Status: DC
  Administered 2024-04-03 – 2024-04-04 (×2): 12.5 mg via ORAL
  Filled 2024-04-03 (×2): qty 1

## 2024-04-03 MED ORDER — METHOCARBAMOL 500 MG PO TABS
500.0000 mg | ORAL_TABLET | Freq: Three times a day (TID) | ORAL | Status: DC | PRN
  Administered 2024-04-04 – 2024-04-05 (×2): 500 mg via ORAL
  Filled 2024-04-03 (×3): qty 1

## 2024-04-03 MED ORDER — HYDROMORPHONE HCL 1 MG/ML IJ SOLN
0.5000 mg | INTRAMUSCULAR | Status: AC
  Administered 2024-04-03: 0.5 mg via INTRAVENOUS
  Filled 2024-04-03: qty 0.5

## 2024-04-03 NOTE — TOC Initial Note (Addendum)
 Transition of Care [facility]) - Initial/Assessment Note    Patient Details  Name: [name] MRN: [id number] Date of Birth: 08-10-1931  Transition of Care [facility]) CM/SW Contact:    Andrez JULIANNA George, RN Phone Number: 04/03/2024, 12:56 PM  Clinical Narrative:                  [name] is a 89 y.o. female with medical history significant of periprosthetic right hip fracture secondary to fall on medical management, recently admitted 1/30, dementia, CVA, status post aortic valve placement with bioprosthetic valve, atrial fibrillation on Eliquis , HFpEF 60 to 65%, essential hypertension, history of dementia, macrocytic anemia and CKD 3B presented to emergency department complaining of right-sided hip pain.  Pt is from home with her son. Son is with her most of the time.  Son provides needed transportation and manages her medications. Son says they were supposed to be getting a lyft and hospital bed delivered to the home. CM found this was ordered through [employer]. CM has asked [employer] to look into when this DME may be delivered to the home.   Pt transported home last admission via [facility].  ICM following.  1353: [employer] to deliver the bed and lyft tomorrow if son is able to meet them at the home. CM has provided pts son with number to [employer] so they can coordinate delivery.  Expected Discharge Plan:  (TBD) Barriers to Discharge: Continued Medical Work up   Patient Goals and CMS Choice   CMS Medicare.gov Compare Post Acute Care list provided to:: Patient Represenative (must comment) Choice offered to / list presented to : Adult Children      Expected Discharge Plan and Services   Discharge Planning Services: CM Consult Post Acute Care Choice: Home Health Living arrangements for the past 2 months: Single Family Home                                      Prior Living Arrangements/Services Living arrangements for the past 2 months: Single Family Home Lives with:: Adult  Children Patient language and need for interpreter reviewed:: Yes          Care giver support system in place?: Yes (comment) Current home services: DME (walker/ shower seat/ wheelchair) Criminal Activity/Legal Involvement Pertinent to Current Situation/Hospitalization: No - Comment as needed  Activities of Daily Living      Permission Sought/Granted                  Emotional Assessment Appearance:: Appears stated age   Affect (typically observed): Quiet Orientation: : Oriented to Self   Psych Involvement: No (comment)  Admission diagnosis:  Right hip pain [M25.551] Acute cystitis without hematuria [N30.00] AKI (acute kidney injury) [N17.9] Hypotension [I95.9] Acute metabolic encephalopathy [G93.41] Encephalopathy, unspecified type [G93.40] Patient Active Problem List   Diagnosis Date Noted   Acute cystitis 04/02/2024   Encephalopathy 04/02/2024   AKI (acute kidney injury) 04/02/2024   Macrocytic anemia 04/02/2024   History of CVA (cerebrovascular accident) 04/02/2024   History of dementia 04/02/2024   Hypotension 04/02/2024   Periprosthetic hip fracture 03/27/2024   Fall at home, initial encounter 03/27/2024   (HFpEF) heart failure with preserved ejection fraction (HCC) 03/27/2024   Dementia with behavioral disturbance (HCC) 03/27/2024   Cerebral atrophy 07/02/2022   S/P laparoscopic cholecystectomy 01/25/2022   Dyslipidemia 01/24/2022   Peripheral vascular disease, unspecified 12/25/2021   CVA (  cerebral vascular accident) (HCC) 06/24/2020   Hypertensive urgency 06/24/2020   Cough variant asthma 01/22/2019   Upper airway cough syndrome 08/23/2018   Chronic right shoulder pain 06/15/2017   Shoulder arthritis 06/15/2017   CKD (chronic kidney disease) stage 3, GFR 30-59 ml/min (HCC) 09/08/2016   Carotid stenosis 05/05/2016   Injury of left ankle 03/11/2016   Elbow pain, right 10/10/2013   Cough 09/12/2013   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   Long term  (current) use of anticoagulants 10/02/2012   Overactive bladder 09/11/2012   Weakness generalized 09/11/2012   Paroxysmal atrial fibrillation (HCC) 09/04/2012   S/P aortic valve replacement with bioprosthetic valve 08/30/2012   Aortic valve stenosis, critical 08/28/2012   Normal coronary arteries-6/14 08/15/2012   DOE (dyspnea on exertion) 08/15/2012   Chest pain-exertional 08/15/2012   Aortic stenosis- critical AS at cath 08/14/12, with surgery 07/20/2012   History of total hip arthroplasty 06/29/2012   Constipation 06/06/2012   Insomnia 06/06/2012   Essential hypertension 06/06/2012   Osteoporosis 06/06/2012   GERD (gastroesophageal reflux disease) 06/06/2012   Laryngeal spasm 12/25/2010   History of breast cancer 09/29/1995   PCP:  [name], NP Pharmacy:   [facility] [address] [place] [address] Phone: [phone] Fax: [phone]     Social Drivers of Health (SDOH) Social History: SDOH Screenings   Food Insecurity: No Food Insecurity (04/03/2024)  Housing: Low Risk (04/03/2024)  Transportation Needs: No Transportation Needs (04/03/2024)  Utilities: Not At Risk (04/03/2024)  Depression (PHQ2-9): Medium Risk (09/19/2023)  Social Connections: Socially Isolated (04/03/2024)  Tobacco Use: Low Risk (04/02/2024)   SDOH Interventions:     Readmission Risk Interventions     No data to display

## 2024-04-03 NOTE — Plan of Care (Signed)

## 2024-04-04 LAB — COMPREHENSIVE METABOLIC PANEL WITH GFR
ALT: 79 U/L — ABNORMAL HIGH (ref 0–44)
AST: 89 U/L — ABNORMAL HIGH (ref 15–41)
Albumin: 3.1 g/dL — ABNORMAL LOW (ref 3.5–5.0)
Alkaline Phosphatase: 73 U/L (ref 38–126)
Anion gap: 11 (ref 5–15)
BUN: 22 mg/dL (ref 8–23)
CO2: 26 mmol/L (ref 22–32)
Calcium: 8.6 mg/dL — ABNORMAL LOW (ref 8.9–10.3)
Chloride: 103 mmol/L (ref 98–111)
Creatinine, Ser: 1.01 mg/dL — ABNORMAL HIGH (ref 0.44–1.00)
GFR, Estimated: 53 mL/min — ABNORMAL LOW
Glucose, Bld: 145 mg/dL — ABNORMAL HIGH (ref 70–99)
Potassium: 3.5 mmol/L (ref 3.5–5.1)
Sodium: 140 mmol/L (ref 135–145)
Total Bilirubin: 1 mg/dL (ref 0.0–1.2)
Total Protein: 5.4 g/dL — ABNORMAL LOW (ref 6.5–8.1)

## 2024-04-04 LAB — CBC
HCT: 25.7 % — ABNORMAL LOW (ref 36.0–46.0)
Hemoglobin: 8.4 g/dL — ABNORMAL LOW (ref 12.0–15.0)
MCH: 33.1 pg (ref 26.0–34.0)
MCHC: 32.7 g/dL (ref 30.0–36.0)
MCV: 101.2 fL — ABNORMAL HIGH (ref 80.0–100.0)
Platelets: 209 10*3/uL (ref 150–400)
RBC: 2.54 MIL/uL — ABNORMAL LOW (ref 3.87–5.11)
RDW: 13.4 % (ref 11.5–15.5)
WBC: 9.6 10*3/uL (ref 4.0–10.5)
nRBC: 0 % (ref 0.0–0.2)

## 2024-04-04 LAB — URINE CULTURE: Culture: >=100000 — AB

## 2024-04-04 MED ORDER — CEFADROXIL 500 MG PO CAPS
500.0000 mg | ORAL_CAPSULE | Freq: Two times a day (BID) | ORAL | Status: DC
  Administered 2024-04-04 – 2024-04-06 (×5): 500 mg via ORAL
  Filled 2024-04-04 (×6): qty 1

## 2024-04-04 MED ORDER — POLYETHYLENE GLYCOL 3350 17 G PO PACK
17.0000 g | PACK | Freq: Every day | ORAL | Status: DC
  Administered 2024-04-04 – 2024-04-05 (×2): 17 g via ORAL
  Filled 2024-04-04 (×2): qty 1

## 2024-04-04 MED ORDER — METOPROLOL TARTRATE 25 MG PO TABS
25.0000 mg | ORAL_TABLET | Freq: Two times a day (BID) | ORAL | Status: DC
  Administered 2024-04-04 – 2024-04-06 (×4): 25 mg via ORAL
  Filled 2024-04-04 (×4): qty 1

## 2024-04-04 NOTE — Discharge Instructions (Signed)
 SABRA

## 2024-04-04 NOTE — Plan of Care (Signed)
 Participation with therapy and yells out when leg moved. Family at bedside. Foods and fluids well tolerated.    Problem: Education: Goal: Knowledge of General Education information will improve Description: Including pain rating scale, medication(s)/side effects and non-pharmacologic comfort measures Outcome: Progressing   Problem: Activity: Goal: Risk for activity intolerance will decrease Outcome: Progressing   Problem: Nutrition: Goal: Adequate nutrition will be maintained Outcome: Progressing   Problem: Pain Managment: Goal: General experience of comfort will improve and/or be controlled Outcome: Progressing   Problem: Safety: Goal: Ability to remain free from injury will improve Outcome: Progressing   Problem: Skin Integrity: Goal: Risk for impaired skin integrity will decrease Outcome: Progressing

## 2024-04-04 NOTE — Progress Notes (Signed)
 SDOH reviewed and resources added to AVS.

## 2024-04-04 NOTE — Progress Notes (Signed)
 " PROGRESS NOTE  SUMAIYAH Carpenter  FMW:[id number] DOB: 1931/08/20 DOA: 04/02/2024 PCP: [name], NP   Brief Narrative: Patient is a 89 year old female with history of periprosthetic right hip fracture secondary to fall with plan for conservative management, dementia, CVA, status post aortic valve replacement with bioprosthetic valve, atrial fibrillation on Eliquis , HFpEF, hypertension, CKD stage IIIb who presented with right-sided hip pain, hallucination.  She was recently admitted on 1/27 and was discharged on 1/30 when she presented with right-sided hip pain secondary to fall.  At that time she was found to have right periprosthetic hip fracture.  Orthopedics was consulted.  Plan was for weightbearing as tolerated to right lower extremity, no abduction, follow-up with [name] an outpatient in 2 weeks, home health was arranged.  As per the family, due to weather, home health was not scheduled and she did not receive any equipment.  On admission this time, lab work showed creatinine of 1.6, WC count of 11.5.  She was hypertensive.  UA showed evidence of UTI, urine culture sent.  After the admission, she underwent PEA cardiac arrest, thought to be secondary to dehydration, UTI, briefly required CPR, ROSC achieved after 1 minute and was briefly placed at vasopressor agent, critical care saw her.  Currently hemodynamically stable.  Waiting for PT/OT evaluation.  Assessment & Plan:  Principal Problem:   Encephalopathy Active Problems:   Periprosthetic hip fracture   Acute cystitis   Paroxysmal atrial fibrillation (HCC)   (HFpEF) heart failure with preserved ejection fraction (HCC)   Dyslipidemia   GERD (gastroesophageal reflux disease)   S/P aortic valve replacement with bioprosthetic valve   AKI (acute kidney injury)   Macrocytic anemia   History of CVA (cerebrovascular accident)   History of dementia   Hypotension   PEA cardiac arrest: Likely secondary to UTI, in the setting of  dehydration.  Briefly required CPR, ROSC achieved after 1 minute.  Currently hemodynamically stable.  Off vasopressors.  PCCM was consulted, did not feel need to be transferred out of ICU  Suspected sepsis/UTI: On presentation, she was hypotensive, required multiple fluid boluses.  Currently normotensive.  On Zosyn .  Follow-up urine culture: Showing pansensitive Klebsiella pneumoniae.  UA strongly suspicious for UTI.  Antibiotic changed to oral.  Metabolic encephalopathy/dementia: Likely multifactorial, secondary to UTI, pain medication.  She was taking tramadol .  Patient also has history of dementia.  Continue delirium precaution, frequent reorientation.  Right hip fracture: Recently admitted for right periprosthetic hip fracture after a fall.  Seen by orthopedic surgery.  Recommended weightbearing as tolerated on the right lower extremity, no abduction.  Plan was for to follow-up with [name]  as an outpatient in 2 weeks.  On last discharge, she was recommended home health but due to bad weather, DME's was not delivered.  PT/OT reconsulted.  I suspect she may need SNF on discharge.  AKI on CKD stage IIIb: Elevated creatinine of 1.6 on prednisone .  Currently kidney function back to baseline.  Home Lasix  and valsartan  on hold.  Continue gentle efforts  History of paroxysmal A-fib: Currently in normal sinus rhythm.  On Eliquis  and metoprolol .  Dose of metoprolol  increased to 25 mg twice daily.  HFpEF/aortic valve replacement: Appear dehydrated on presentation.  Holding Lasix , valsartan .  Chronic anemia : Currently hemoglobin in the range of 8.  No evidence of acute blood loss  History of CVA: On Eliquis , Lipitor at home              DVT  prophylaxis:SCDs Start: 04/02/24 0252 apixaban  (ELIQUIS ) tablet 5 mg     Code Status: Full Code  Family Communication: None at the bedside  Patient status: Inpatient  Patient is from : Home  Anticipated discharge to: SNF versus home  health  Estimated DC date: 1 to 2 days   Consultants: PCCM  Procedures: None  Antimicrobials:  Anti-infectives (From admission, onward)    Start     Dose/Rate Route Frequency Ordered Stop   04/02/24 1400  piperacillin -tazobactam (ZOSYN ) IVPB 3.375 g       Placed in And Linked Group   3.375 g 12.5 mL/hr over 240 Minutes Intravenous Every 8 hours 04/02/24 0847     04/02/24 0900  piperacillin -tazobactam (ZOSYN ) IVPB 3.375 g       Placed in And Linked Group   3.375 g 100 mL/hr over 30 Minutes Intravenous  Once 04/02/24 0847 04/02/24 1014   04/02/24 0245  fosfomycin  (MONUROL ) packet 3 g        3 g Oral  Once 04/02/24 0244 04/02/24 0301       Subjective: Patient seen and examined at bedside today.  Hemodynamically stable.  Lying on bed.  Remains in A-fib rhythm with heart rate in the range of 100-120.  Not in any kind of distress.  Overall looks comfortable.  Confused, not agitated.  Denying new complaints  Objective: Vitals:   04/04/24 0759 04/04/24 0800 04/04/24 1025 04/04/24 1145  BP: 139/69  139/69 114/67  Pulse: 99  99 89  Resp: 20   19  Temp: 100.1 F (37.8 C)   99.2 F (37.3 C)  TempSrc:      SpO2: 98%   98%  Weight:  64.8 kg    Height:        Intake/Output Summary (Last 24 hours) at 04/04/2024 1303 Last data filed at 04/03/2024 1832 Gross per 24 hour  Intake --  Output 250 ml  Net -250 ml   Filed Weights   04/04/24 0800  Weight: 64.8 kg    Examination:  General exam: Overall comfortable, not in distress HEENT: PERRL Respiratory system:  no wheezes or crackles  Cardiovascular system: Irregularly irregular rhythm Gastrointestinal system: Abdomen is nondistended, soft and nontender. Central nervous system: Alert and awake but not oriented Extremities: No edema, no clubbing ,no cyanosis, tenderness on the right hip Skin: No rashes, no ulcers,no icterus     Data Reviewed: I have personally reviewed following labs and imaging studies  CBC: Recent  Labs  Lab 04/02/24 0120 04/02/24 0448 04/03/24 0853 04/04/24 0217  WBC 11.5* 8.9 7.5 9.6  NEUTROABS 8.2*  --   --   --   HGB 10.2* 8.9* 8.5* 8.4*  HCT 30.6* 26.2* 25.6* 25.7*  MCV 100.0 100.4* 100.8* 101.2*  PLT 199 177 193 209   Basic Metabolic Panel: Recent Labs  Lab 04/02/24 0120 04/02/24 0448 04/03/24 0853 04/04/24 0217  NA 141 141 143 140  K 3.7 3.5 3.7 3.5  CL 98 102 105 103  CO2 22 27 27 26   GLUCOSE 114* 110* 68* 145*  BUN 37* 37* 27* 22  CREATININE 1.68* 1.57* 1.14* 1.01*  CALCIUM  10.0 9.2 8.6* 8.6*  MG  --   --  1.7  --      Recent Results (from the past 240 hours)  Urine Culture     Status: Abnormal   Collection Time: 04/02/24  1:20 AM   Specimen: Urine, Catheterized  Result Value Ref Range Status   Specimen Description URINE, CATHETERIZED  Final   Special Requests   Final    NONE Performed at [facility], [address], [place], [place] [address]    Culture >=100,000 COLONIES/mL KLEBSIELLA PNEUMONIAE (A)  Final   Report Status 04/04/2024 FINAL  Final   Organism ID, Bacteria KLEBSIELLA PNEUMONIAE (A)  Final      Susceptibility   Klebsiella pneumoniae - MIC*    AMPICILLIN >=32 RESISTANT Resistant     CEFAZOLIN (URINE) Value in next row Sensitive      2 SENSITIVEThis is a modified FDA-approved test that has been validated and its performance characteristics determined by the reporting laboratory.  This laboratory is certified under the Clinical Laboratory Improvement Amendments CLIA as qualified to perform high complexity clinical laboratory testing.    CEFEPIME Value in next row Sensitive      2 SENSITIVEThis is a modified FDA-approved test that has been validated and its performance characteristics determined by the reporting laboratory.  This laboratory is certified under the Clinical Laboratory Improvement Amendments CLIA as qualified to perform high complexity clinical laboratory testing.    ERTAPENEM Value in next row Sensitive      2  SENSITIVEThis is a modified FDA-approved test that has been validated and its performance characteristics determined by the reporting laboratory.  This laboratory is certified under the Clinical Laboratory Improvement Amendments CLIA as qualified to perform high complexity clinical laboratory testing.    CEFTRIAXONE  Value in next row Sensitive      2 SENSITIVEThis is a modified FDA-approved test that has been validated and its performance characteristics determined by the reporting laboratory.  This laboratory is certified under the Clinical Laboratory Improvement Amendments CLIA as qualified to perform high complexity clinical laboratory testing.    CIPROFLOXACIN  Value in next row Sensitive      2 SENSITIVEThis is a modified FDA-approved test that has been validated and its performance characteristics determined by the reporting laboratory.  This laboratory is certified under the Clinical Laboratory Improvement Amendments CLIA as qualified to perform high complexity clinical laboratory testing.    GENTAMICIN Value in next row Sensitive      2 SENSITIVEThis is a modified FDA-approved test that has been validated and its performance characteristics determined by the reporting laboratory.  This laboratory is certified under the Clinical Laboratory Improvement Amendments CLIA as qualified to perform high complexity clinical laboratory testing.    NITROFURANTOIN Value in next row Intermediate      2 SENSITIVEThis is a modified FDA-approved test that has been validated and its performance characteristics determined by the reporting laboratory.  This laboratory is certified under the Clinical Laboratory Improvement Amendments CLIA as qualified to perform high complexity clinical laboratory testing.    TRIMETH/SULFA Value in next row Sensitive      2 SENSITIVEThis is a modified FDA-approved test that has been validated and its performance characteristics determined by the reporting laboratory.  This laboratory is  certified under the Clinical Laboratory Improvement Amendments CLIA as qualified to perform high complexity clinical laboratory testing.    AMPICILLIN/SULBACTAM Value in next row Sensitive      2 SENSITIVEThis is a modified FDA-approved test that has been validated and its performance characteristics determined by the reporting laboratory.  This laboratory is certified under the Clinical Laboratory Improvement Amendments CLIA as qualified to perform high complexity clinical laboratory testing.    PIP/TAZO Value in next row Sensitive      <=4 SENSITIVEThis is a modified FDA-approved test that has been validated and its performance  characteristics determined by the reporting laboratory.  This laboratory is certified under the Clinical Laboratory Improvement Amendments CLIA as qualified to perform high complexity clinical laboratory testing.    MEROPENEM Value in next row Sensitive      <=4 SENSITIVEThis is a modified FDA-approved test that has been validated and its performance characteristics determined by the reporting laboratory.  This laboratory is certified under the Clinical Laboratory Improvement Amendments CLIA as qualified to perform high complexity clinical laboratory testing.    * >=100,000 COLONIES/mL KLEBSIELLA PNEUMONIAE     Radiology Studies: No results found.  Scheduled Meds:  apixaban   5 mg Oral BID   atorvastatin   40 mg Oral Daily   metoprolol  tartrate  12.5 mg Oral BID   senna-docusate  1 tablet Oral BID   sodium chloride  flush  3 mL Intravenous Q12H   sodium chloride  flush  3 mL Intravenous Q12H   Continuous Infusions:  piperacillin -tazobactam 3.375 g (04/04/24 0524)     LOS: 2 days   Ivonne Mustache, MD [facility] P2/05/2024, 1:03 PM  "

## 2024-04-04 NOTE — Evaluation (Signed)
 Physical Therapy Evaluation Patient Details Name: [name] MRN: [id number] DOB: 1931/03/06 Today's Date: 04/04/2024  History of Present Illness  89 yo admitted after a fall OOB resulting in R hip periprosthetic fx, non-op mgmnt, WBAT with RW, no active abduction;  PMH: Retention, hyperlipidemia, atrial fibrillation,  s/p aortic valve replacement, CVA, dementia, breast cancer s/p mastectomy on L, lumpectomy on R.  Clinical Impression  Pt presents with admitting diagnosis above. Co-treat with OT. Pt today was able to sit EOB with +2 total A and perform ADLs with OT. Pt very limited by R hip pain to even the slightest movement. Pt likely needs SNF however pt son present stating that he plans to take pt home with hoyer lift and HHPT therefore recommending HHPT. Pt son states that he can provide 24/7 assistance. PT will continue to follow.         If plan is discharge home, recommend the following: Two people to help with walking and/or transfers;A lot of help with bathing/dressing/bathroom;Help with stairs or ramp for entrance;Supervision due to cognitive status;Direct supervision/assist for medications management;Direct supervision/assist for financial management;Assistance with feeding;Assistance with cooking/housework   Can travel by private vehicle        Equipment Recommendations Hoyer lift  Recommendations for Other Services       Functional Status Assessment Patient has had a recent decline in their functional status and/or demonstrates limited ability to make significant improvements in function in a reasonable and predictable amount of time     Precautions / Restrictions Precautions Precautions: Fall Recall of Precautions/Restrictions: Impaired Precaution/Restrictions Comments: R hip no active abduction Restrictions Weight Bearing Restrictions Per Provider Order: Yes RLE Weight Bearing Per Provider Order: Weight bearing as tolerated      Mobility  Bed Mobility Overal  bed mobility: Needs Assistance Bed Mobility: Supine to Sit, Sit to Supine     Supine to sit: Total assist, +2 for physical assistance, +2 for safety/equipment Sit to supine: Total assist, +2 for physical assistance, +2 for safety/equipment   General bed mobility comments: Pt not following commands to sequence bed mobility, use of bed pads to assist with helicopter method    Transfers                   General transfer comment: Deferred for safety.    Ambulation/Gait               General Gait Details: Unable  Stairs            Wheelchair Mobility     Tilt Bed    Modified Rankin (Stroke Patients Only)       Balance Overall balance assessment: Needs assistance Sitting-balance support: Feet supported, Bilateral upper extremity supported, Single extremity supported Sitting balance-Leahy Scale: Fair Sitting balance - Comments: CGA                                     Pertinent Vitals/Pain Pain Assessment Pain Assessment: Faces Faces Pain Scale: Hurts whole lot Pain Location: RLE with movement Pain Descriptors / Indicators: Grimacing, Guarding, Moaning Pain Intervention(s): Limited activity within patient's tolerance, Monitored during session, Premedicated before session, Repositioned    Home Living Family/patient expects to be discharged to:: Private residence Living Arrangements: Children Available Help at Discharge: Family;Available PRN/intermittently Type of Home: House Home Access: Stairs to enter Entrance Stairs-Rails: None Entrance Stairs-Number of Steps: 3   Home Layout: One level Home  Equipment: [employer] (2 wheels) Additional Comments: Per son, hoyer lift was ordered during last admission, but has not been delievered yet    Prior Function Prior Level of Function : Needs assist             Mobility Comments: Uses RW for most ambulation; son supervises walking outdoors and stairs ADLs Comments: Son assists  with ADLs, pt wears briefs at baseline. Son completes IADls     Extremity/Trunk Assessment   Upper Extremity Assessment Upper Extremity Assessment: Defer to OT evaluation    Lower Extremity Assessment Lower Extremity Assessment: RLE deficits/detail RLE Deficits / Details: R hip fx with significant pain during movement.       Communication   Communication Communication: Impaired Factors Affecting Communication: Hearing impaired;Difficulty expressing self;Reduced clarity of speech    Cognition Arousal: Alert Behavior During Therapy: Anxious, Flat affect   PT - Cognitive impairments: History of cognitive impairments                       PT - Cognition Comments: Hx of dementia Following commands: Impaired Following commands impaired: Follows one step commands inconsistently     Cueing Cueing Techniques: Verbal cues, Gestural cues, Tactile cues     General Comments General comments (skin integrity, edema, etc.): VSS on RA    Exercises     Assessment/Plan    PT Assessment Patient needs continued PT services  PT Problem List Decreased strength;Decreased range of motion;Decreased activity tolerance;Decreased balance;Decreased mobility;Decreased coordination;Decreased cognition;Decreased knowledge of use of DME;Decreased knowledge of precautions;Decreased safety awareness;Pain       PT Treatment Interventions DME instruction;Functional mobility training;Therapeutic activities;Therapeutic exercise;Stair training;Balance training;Neuromuscular re-education;Cognitive remediation;Patient/family education;Wheelchair mobility training;Manual techniques;Modalities    PT Goals (Current goals can be found in the Care Plan section)  Acute Rehab PT Goals Patient Stated Goal: Pt unable to state PT Goal Formulation: With family Time For Goal Achievement: 04/18/24 Potential to Achieve Goals: Fair    Frequency Min 2X/week     Co-evaluation PT/OT/SLP  Co-Evaluation/Treatment: Yes Reason for Co-Treatment: For patient/therapist safety;To address functional/ADL transfers;Necessary to address cognition/behavior during functional activity PT goals addressed during session: Mobility/safety with mobility;Proper use of DME OT goals addressed during session: ADL's and self-care       AM-PAC PT 6 Clicks Mobility  Outcome Measure Help needed turning from your back to your side while in a flat bed without using bedrails?: Total Help needed moving from lying on your back to sitting on the side of a flat bed without using bedrails?: Total Help needed moving to and from a bed to a chair (including a wheelchair)?: Total Help needed standing up from a chair using your arms (e.g., wheelchair or bedside chair)?: Total Help needed to walk in hospital room?: Total Help needed climbing 3-5 steps with a railing? : Total 6 Click Score: 6    End of Session   Activity Tolerance: Patient limited by pain;Patient tolerated treatment well Patient left: in bed;with call bell/phone within reach;with bed alarm set;with family/visitor present Nurse Communication: Mobility status;Need for lift equipment;Patient requests pain meds;Other (comment) PT Visit Diagnosis: Other abnormalities of gait and mobility (R26.89);History of falling (Z91.81);Pain Pain - Right/Left: Right Pain - part of body: Hip;Leg;Knee    Time: 8890-8864 PT Time Calculation (min) (ACUTE ONLY): 26 min   Charges:   PT Evaluation $PT Eval Moderate Complexity: 1 Mod   PT General Charges $$ ACUTE PT VISIT: 1 Visit  Shaniece Bussa B, PT, DPT Acute Rehab Services [phone]   Mischele Detter 04/04/2024, 4:18 PM

## 2024-04-04 NOTE — Evaluation (Signed)
 Occupational Therapy Evaluation Patient Details Name: [name] MRN: [id number] DOB: June 17, 1931 Today's Date: 04/04/2024   History of Present Illness   89 yo admitted after a fall OOB resulting in R hip periprosthetic fx, non-op mgmnt, WBAT with RW, no active abduction;  PMH: Retention, hyperlipidemia, atrial fibrillation,  s/p aortic valve replacement, CVA, dementia, breast cancer s/p mastectomy on L, lumpectomy on R.     Clinical Impressions Pt admitted based on above, and was seen based on problem list below. Per pt's son, prior to hip fx she was receiving assistance with ADLs d/t cog. Since recent d/c pt has been limited to bed level activity, while awaiting HH services. Today pt is requiring set up  to max  assist for ADLs. Bed mobility was  total +2 assist. Pt unable to assist with mobility d/t pain. Based on today's performance recommending  <3 hours of skilled rehab daily, however pt's family with preference to d/c home with [facility] services. OT will continue to follow acutely to maximize functional independence.        If plan is discharge home, recommend the following:   Two people to help with walking and/or transfers;Two people to help with bathing/dressing/bathroom;Assistance with cooking/housework;Assistance with feeding;Direct supervision/assist for medications management;Direct supervision/assist for financial management;Assist for transportation;Help with stairs or ramp for entrance     Functional Status Assessment   Patient has had a recent decline in their functional status and/or demonstrates limited ability to make significant improvements in function in a reasonable and predictable amount of time     Equipment Recommendations   Hoyer lift      Precautions/Restrictions   Precautions Precautions: Fall Recall of Precautions/Restrictions: Impaired Precaution/Restrictions Comments: R hip no active abduction Restrictions Weight Bearing Restrictions Per  Provider Order: Yes RLE Weight Bearing Per Provider Order: Weight bearing as tolerated     Mobility Bed Mobility Overal bed mobility: Needs Assistance Bed Mobility: Supine to Sit, Sit to Supine     Supine to sit: Total assist, +2 for physical assistance, +2 for safety/equipment Sit to supine: Total assist, +2 for physical assistance, +2 for safety/equipment   General bed mobility comments: Pt not following commands to sequence bed mobility, use of bed pads to assist with helicopter method       Balance Overall balance assessment: Needs assistance Sitting-balance support: Feet supported, Bilateral upper extremity supported, Single extremity supported Sitting balance-Leahy Scale: Fair Sitting balance - Comments: CGA     ADL either performed or assessed with clinical judgement   ADL Overall ADL's : Needs assistance/impaired Eating/Feeding: Set up;Sitting   Grooming: Wash/dry face;Oral care;Set up;Sitting   Upper Body Bathing: Set up;Sitting   Lower Body Bathing: Maximal assistance;Sitting/lateral leans   Upper Body Dressing : Set up;Sitting   Lower Body Dressing: Maximal assistance;Sitting/lateral leans;Bed level       General ADL Comments: Limited to EOBB d/t pain     Vision Baseline Vision/History: 1 Wears glasses Vision Assessment?: No apparent visual deficits            Pertinent Vitals/Pain Pain Assessment Pain Assessment: Faces Faces Pain Scale: Hurts whole lot Pain Location: RLE with movement Pain Descriptors / Indicators: Grimacing, Guarding, Moaning Pain Intervention(s): Limited activity within patient's tolerance     Extremity/Trunk Assessment Upper Extremity Assessment Upper Extremity Assessment: Generalized weakness   Lower Extremity Assessment Lower Extremity Assessment: Defer to PT evaluation       Communication Communication Communication: Impaired Factors Affecting Communication: Hearing impaired;Difficulty expressing self;Reduced  clarity of speech  Cognition Arousal: Alert Behavior During Therapy: Anxious, Flat affect Cognition: History of cognitive impairments             OT - Cognition Comments: Pt with difficulty with word retrieval, perseverating on needing to pee, despite education from multiple staff regarding use of purewick                 Following commands: Impaired Following commands impaired: Follows one step commands inconsistently     Cueing  General Comments   Cueing Techniques: Verbal cues;Gestural cues;Tactile cues  VSS on RA           Home Living Family/patient expects to be discharged to:: Private residence Living Arrangements: Children Available Help at Discharge: Family;Available PRN/intermittently Type of Home: House Home Access: Stairs to enter [employer] of Steps: 3 Entrance Stairs-Rails: None Home Layout: One level     Bathroom Shower/Tub: Producer, Television/film/video: Standard     Home Equipment: Agricultural [employer]ant (2 wheels)   Additional Comments: Per son, hoyer lift was ordered during last admission, but has not been delievered yet      Prior Functioning/Environment Prior Level of Function : Needs assist             Mobility Comments: Uses RW for most ambulation; son supervises walking outdoors and stairs ADLs Comments: Son assists with ADLs, pt wears briefs at baseline. Son completes IADls    OT Problem List: Decreased strength;Impaired balance (sitting and/or standing);Pain;Decreased cognition;Decreased knowledge of precautions;Decreased knowledge of use of DME or AE   OT Treatment/Interventions: Self-care/ADL training;DME and/or AE instruction;Therapeutic activities;Patient/family education;Balance training      OT Goals(Current goals can be found in the care plan section)   Acute Rehab OT Goals Patient Stated Goal: None stated OT Goal Formulation: With family Time For Goal Achievement: 04/18/24 Potential to Achieve  Goals: Good   OT Frequency:  Min 2X/week    Co-evaluation PT/OT/SLP Co-Evaluation/Treatment: Yes Reason for Co-Treatment: For patient/therapist safety;To address functional/ADL transfers;Necessary to address cognition/behavior during functional activity   OT goals addressed during session: ADL's and self-care      AM-PAC OT 6 Clicks Daily Activity     Outcome Measure Help from another person eating meals?: A Little Help from another person taking care of personal grooming?: A Little Help from another person toileting, which includes using toliet, bedpan, or urinal?: Total Help from another person bathing (including washing, rinsing, drying)?: A Lot Help from another person to put on and taking off regular upper body clothing?: A Little Help from another person to put on and taking off regular lower body clothing?: A Lot 6 Click Score: 14   End of Session Nurse Communication: Mobility status  Activity Tolerance: Patient limited by pain Patient left: in bed;with call bell/phone within reach;with bed alarm set;with family/visitor present  OT Visit Diagnosis: Pain;Unsteadiness on feet (R26.81);Muscle weakness (generalized) (M62.81);Other symptoms and signs involving cognitive function Pain - Right/Left: Right Pain - part of body: Hip                Time: 8890-8864 OT Time Calculation (min): 26 min Charges:  OT General Charges $OT Visit: 1 Visit OT Evaluation $OT Eval Moderate Complexity: 1 Mod  Keara Pagliarulo C, OT  Acute Rehabilitation Services Office [phone] Secure chat preferred   Adrianne GORMAN Savers 04/04/2024, 2:33 PM

## 2024-04-05 ENCOUNTER — Other Ambulatory Visit (HOSPITAL_COMMUNITY): Payer: Self-pay

## 2024-04-05 ENCOUNTER — Ambulatory Visit: Admitting: Podiatry

## 2024-04-05 LAB — BASIC METABOLIC PANEL WITH GFR
Anion gap: 9 (ref 5–15)
BUN: 18 mg/dL (ref 8–23)
CO2: 26 mmol/L (ref 22–32)
Calcium: 8.5 mg/dL — ABNORMAL LOW (ref 8.9–10.3)
Chloride: 104 mmol/L (ref 98–111)
Creatinine, Ser: 0.97 mg/dL (ref 0.44–1.00)
GFR, Estimated: 55 mL/min — ABNORMAL LOW
Glucose, Bld: 94 mg/dL (ref 70–99)
Potassium: 4 mmol/L (ref 3.5–5.1)
Sodium: 139 mmol/L (ref 135–145)

## 2024-04-05 LAB — MAGNESIUM: Magnesium: 2 mg/dL (ref 1.7–2.4)

## 2024-04-05 MED ORDER — CEFADROXIL 500 MG PO CAPS
500.0000 mg | ORAL_CAPSULE | Freq: Two times a day (BID) | ORAL | 0 refills | Status: AC
  Filled 2024-04-05: qty 8, 4d supply, fill #0

## 2024-04-05 MED ORDER — SENNOSIDES-DOCUSATE SODIUM 8.6-50 MG PO TABS
1.0000 | ORAL_TABLET | Freq: Two times a day (BID) | ORAL | 0 refills | Status: AC
  Filled 2024-04-05: qty 28, 14d supply, fill #0

## 2024-04-05 MED ORDER — APIXABAN 5 MG PO TABS
5.0000 mg | ORAL_TABLET | Freq: Two times a day (BID) | ORAL | 0 refills | Status: AC
  Filled 2024-04-05: qty 60, 30d supply, fill #0

## 2024-04-05 MED ORDER — POLYETHYLENE GLYCOL 3350 17 GM/SCOOP PO POWD
17.0000 g | Freq: Every day | ORAL | 0 refills | Status: AC
  Filled 2024-04-05: qty 238, 14d supply, fill #0

## 2024-04-05 MED ORDER — METOPROLOL TARTRATE 25 MG PO TABS
25.0000 mg | ORAL_TABLET | Freq: Two times a day (BID) | ORAL | 0 refills | Status: AC
  Filled 2024-04-05: qty 60, 30d supply, fill #0

## 2024-04-05 MED ORDER — OXYCODONE HCL 5 MG PO TABS
5.0000 mg | ORAL_TABLET | ORAL | 0 refills | Status: AC | PRN
  Filled 2024-04-05: qty 30, 5d supply, fill #0

## 2024-04-05 NOTE — Plan of Care (Incomplete)

## 2024-04-05 NOTE — Discharge Summary (Signed)
 Physician Discharge Summary  [name] FMW:[id number] DOB: Sep 24, 1931 DOA: 04/02/2024  PCP: [name], NP  Admit date: 04/02/2024 Discharge date: 04/05/2024  Admitted From: Home Disposition:  Home  Discharge Condition:Stable CODE STATUS:FULL Diet recommendation: Heart Healthy   Brief/Interim Summary: Patient is a 89 year old female with history of periprosthetic right hip fracture secondary to fall with plan for conservative management, dementia, CVA, status post aortic valve replacement with bioprosthetic valve, atrial fibrillation on Eliquis , HFpEF, hypertension, CKD stage IIIb who presented with right-sided hip pain, hallucination.  She was recently admitted on 1/27 and was discharged on 1/30 when she presented with right-sided hip pain secondary to fall.  At that time she was found to have right periprosthetic hip fracture.  Orthopedics was consulted.  Plan was for weightbearing as tolerated to right lower extremity, no abduction, follow-up with [name] an outpatient in 2 weeks, home health was arranged.  As per the family, due to weather, home health was not scheduled and she did not receive any equipment.  On admission this time, lab work showed creatinine of 1.6, WC count of 11.5.  She was hypertensive.  UA showed evidence of UTI, urine culture sent.  After the admission, she underwent PEA cardiac arrest, thought to be secondary to dehydration, UTI, briefly required CPR, ROSC achieved after 1 minute and was briefly placed at vasopressor agent, critical care saw her.  No need for  transfer to ICU.  Currently hemodynamically stable.  PT again recommending SNF but son wants to take her home.  Medically stable for discharge home today.  Following problems were addressed during the hospitalization:  PEA cardiac arrest: Likely secondary to UTI, in the setting of dehydration.  Briefly required CPR, ROSC achieved after 1 minute.  Currently hemodynamically stable.  Off vasopressors.   PCCM was consulted, did not feel need to be transferred out of ICU.   Suspected sepsis/UTI: On presentation, she was hypotensive, required multiple fluid boluses.  Currently normotensive.  urine culture: Showing pansensitive Klebsiella pneumoniae.  UA strongly suspicious for UTI.  Antibiotic changed to oral.   Metabolic encephalopathy/dementia: Likely multifactorial, secondary to UTI, pain medication.  She was taking tramadol .  Patient also has history of dementia.  Mentation at baseline this morning  Right hip fracture: Recently admitted for right periprosthetic hip fracture after a fall.  Seen by orthopedic surgery.  Recommended weightbearing as tolerated on the right lower extremity, no abduction.  Plan was for to follow-up with [name]  as an outpatient in 2 weeks.  On last discharge, she was recommended home health but due to bad weather, DME's was not delivered.  PT/OT reconsulted.  Recommended SNF but son wanted to take her home.  Home health arranged  AKI on CKD stage IIIb: Elevated creatinine of 1.6 on presentation.  Currently kidney function back to baseline.  Home Lasix  and valsartan  on hold.  Blood pressure soft so we will continue holding Lasix  and valsartan    History of paroxysmal A-fib:   On Eliquis  and metoprolol .  Dose of metoprolol  increased to 25 mg twice daily.  Heart rate well-controlled this morning.  We recommend to follow-up with cardiology as soon as possible as an outpatient.   HFpEF/aortic valve replacement: Appear dehydrated on presentation.  Holding Lasix , valsartan .  Follow-up with cardiology as an outpatient   Chronic anemia : Currently hemoglobin in the range of 8.  No evidence of acute blood loss   History of CVA: On Eliquis , Lipitor at home  Discharge Diagnoses:  Principal Problem:   Encephalopathy Active Problems:   Periprosthetic hip fracture   Acute cystitis   Paroxysmal atrial fibrillation (HCC)   (HFpEF) heart failure with preserved  ejection fraction (HCC)   Dyslipidemia   GERD (gastroesophageal reflux disease)   S/P aortic valve replacement with bioprosthetic valve   AKI (acute kidney injury)   Macrocytic anemia   History of CVA (cerebrovascular accident)   History of dementia   Hypotension    Discharge Instructions  Discharge Instructions     Diet - low sodium heart healthy   Complete by: As directed    Discharge instructions   Complete by: As directed    1)Please take your medications as instructed 2)Follow up with Orthopedics in a week. Name and number of the provider has been attached.Call for appointment 3)Follow up with your PCP in   a week 4)Follow up with your cardiologist 5)We recommend   weightbearing as tolerated on the right lower extremity, no abduction.   Increase activity slowly   Complete by: As directed       Allergies as of 04/05/2024       Reactions   Lisinopril  Cough   Ceftriaxone  Rash   Codeine Rash   All over the body or any related products        Medication List     STOP taking these medications    furosemide  20 MG tablet Commonly known as: LASIX    traMADol  50 MG tablet Commonly known as: ULTRAM    valsartan  160 MG tablet Commonly known as: DIOVAN        TAKE these medications    acetaminophen  650 MG CR tablet Commonly known as: TYLENOL  Take 1 tablet (650 mg total) by mouth every 6 (six) hours as needed for pain.   albuterol  108 (90 Base) MCG/ACT inhaler Commonly known as: VENTOLIN  HFA Inhale 2 puffs into the lungs every 6 (six) hours as needed for wheezing or shortness of breath.   apixaban  5 MG Tabs tablet Commonly known as: ELIQUIS  Take 1 tablet (5 mg total) by mouth 2 (two) times daily.   atorvastatin  40 MG tablet Commonly known as: LIPITOR Take 1 tablet (40 mg total) by mouth daily.   cefadroxil  500 MG capsule Commonly known as: DURICEF Take 1 capsule (500 mg total) by mouth 2 (two) times daily for 4 days.   CITRACAL + D PO Take 1 tablet  by mouth daily.   diclofenac  Sodium 1 % Gel Commonly known as: Voltaren  Apply 4 g topically 4 (four) times daily as needed.   metoprolol  tartrate 25 MG tablet Commonly known as: LOPRESSOR  Take 1 tablet (25 mg total) by mouth 2 (two) times daily. What changed: how much to take   oxyCODONE  5 MG immediate release tablet Commonly known as: Oxy IR/ROXICODONE  Take 1 tablet (5 mg total) by mouth every 4 (four) hours as needed for moderate pain (pain score 4-6).   PHILLIPS MILK OF MAGNESIA PO Take 15 mLs by mouth every evening. Take daily at 6 o'clock at night   polyethylene glycol 17 g packet Commonly known as: MIRALAX  / GLYCOLAX  Take 17 g by mouth daily.   potassium chloride  10 MEQ tablet Commonly known as: KLOR-CON  M Take 1/2 tablet (5 mEq total) by mouth daily.   senna-docusate 8.6-50 MG tablet Commonly known as: Senokot-S Take 1 tablet by mouth 2 (two) times daily for 14 days.   vitamin C 100 MG tablet Take 100 mg by mouth daily.  Durable Medical Equipment  (From admission, onward)           Start     Ordered   04/05/24 0946  For home use only DME Hospital bed  Once       Question Answer Comment  Length of Need 6 Months   The above medical condition requires: Patient requires the ability to reposition frequently   Head must be elevated greater than: 30 degrees   Bed type Semi-electric      04/05/24 0946            Follow-up Information     [name], NP. Schedule an appointment as soon as possible for a visit in 1 week(s).   Specialty: Geriatric Medicine Contact information: [address] [place] [address] [phone]         Edna Toribio LABOR, MD. Schedule an appointment as soon as possible for a visit in 1 week(s).   Specialty: Orthopedic Surgery Contact information: [address] [place] [address] [phone]                 Allergies[1]  Consultations: None   Procedures/Studies: DG Hip Unilat W or Wo Pelvis 2-3 Views Right Result Date: 04/02/2024 EXAM: 2-3 VIEW(S) XRAY OF THE PELVIS AND RIGHT HIP 04/02/2024 01:46:31 AM COMPARISON: 03/27/2024 CLINICAL HISTORY: Pain. FINDINGS: BONES AND JOINTS: SI joints are symmetric. Right hip arthroplasty is in place. There is an acute periprosthetic fracture of the proximal right femur with slightly more apparent fracture lines. The left hip demonstrates normal alignment. SOFT TISSUES: Unremarkable. IMPRESSION: 1. Acute periprosthetic fracture of the proximal right femur with slightly more apparent fracture lines. 2. Right hip arthroplasty in place. Electronically signed by: Greig Pique MD 04/02/2024 01:50 AM EST RP Workstation: [id number]   CT FEMUR RIGHT WO CONTRAST Result Date: 03/27/2024 EXAM: CT RIGHT PROXIMAL FEMUR, WITHOUT IV CONTRAST 03/27/2024 10:47:03 AM TECHNIQUE: Axial images were acquired through the right proximal femur without IV contrast. Reformatted images were reviewed. Automated exposure control, iterative reconstruction, and/or weight based adjustment of the mA/kV was utilized to reduce the radiation dose to as low as reasonably achievable. COMPARISON: Right hip radiograph 03/27/2024. CLINICAL HISTORY: Upper leg trauma, periprosthetic fracture of the right proximal femur. FINDINGS: BONES AND JOINTS: CT confirms a periprosthetic fracture of the right proximal femur with fracture plan extending through the greater trochanter and along the proximal stem, with an anterior greater trochanteric fragment noted extending into the proximal metaphysis and with the bottom margin of the fracture about 10.6 cm distal to the proximal tip of the greater trochanter. Small bilateral knee joint effusions. No dislocation. SOFT TISSUES: Suspected hematoma in the vastus intermedius muscle extending up to 19 cm distal to the fracture. Gluteus minimus muscular atrophy. Iliopsoas muscular  atrophy. VASCULATURE: Atheromatous vascular calcifications. STOMACH AND BOWEL: Sigmoid colon diverticulosis. IMPRESSION: 1. Periprosthetic fracture of the right proximal femur extending through the greater trochanter and along the proximal femoral stem, with an anterior greater trochanteric fragment extending into the proximal metaphysis and the distal margin of the fracture approximately 10.6 cm distal to the proximal tip of the greater trochanter. 2. Suspected edema/intramuscular hematoma in the vastus intermedius muscle extending distal to the fracture. 3. Small bilateral knee joint effusions. 4. Gluteus minimus muscular atrophy. 5. Iliopsoas muscular atrophy. 6. Atheromatous vascular calcifications. Electronically signed by: Ryan Salvage MD 03/27/2024 12:07 PM EST RP Workstation: [id number]   DG HIP UNILAT WITH PELVIS 1V RIGHT Result Date: 03/27/2024 EXAM: 3  VIEW(S) XRAY OF THE PELVIS AND RIGHT HIP 03/27/2024 07:57:00 AM COMPARISON: Pelvis radiographs 03/27/2024 06:54 AM. CLINICAL HISTORY: 89 year old female. Fall. Proximal right femur periprosthetic fracture. FINDINGS: BONES AND JOINTS: SI joints are symmetric. Stable and intact visible pelvis. Right hip: Mildly comminuted and minimally displaced periprosthetic fracture of the proximal right femur beginning at the distal trochanter level appears stable. Underlying right bipolar hip arthroplasty hardware appears intact and aligned. Left hip: Normal alignment. SOFT TISSUES: Unremarkable. IMPRESSION: 1. Stable mildly comminuted, minimally displaced proximal right femur periprosthetic fracture. 2. Intact and aligned right bipolar hip arthroplasty hardware. Electronically signed by: Helayne Hurst MD 03/27/2024 08:03 AM EST RP Workstation: [id number]   CT Cervical Spine Wo Contrast Result Date: 03/27/2024 EXAM: CT CERVICAL SPINE WITHOUT CONTRAST 03/27/2024 07:41:00 AM TECHNIQUE: CT of the cervical spine was performed without the administration of intravenous  contrast. Multiplanar reformatted images are provided for review. Automated exposure control, iterative reconstruction, and/or weight based adjustment of the mA/kV was utilized to reduce the radiation dose to as low as reasonably achievable. COMPARISON: CT cervical spine 03/19/2023. CLINICAL HISTORY: Neck trauma. Fall, on blood thinners. FINDINGS: BONES AND ALIGNMENT: Chronic straightening of the normal cervical lordosis. Trace anterolisthesis of C3 on C4 and C7 on T1. Trace retrolisthesis of C5 on C6. No acute fracture or traumatic malalignment. No suspicious bone lesion. DEGENERATIVE CHANGES: Multilevel disc degeneration which is most advanced at C2-C3, C5-C6, and C6-C7. Asymmetric left sided facet arthrosis in the upper and mid cervical spine. Moderately advanced atlantodental degenerative changes. SOFT TISSUES: No prevertebral soft tissue swelling. LUNGS: Biapical pleural parenchymal lung scarring and calcification. IMPRESSION: 1. No acute cervical spine fracture or traumatic malalignment. Electronically signed by: Dasie Hamburg MD 03/27/2024 07:51 AM EST RP Workstation: [id number]   CT Head Wo Contrast Result Date: 03/27/2024 EXAM: CT HEAD WITHOUT CONTRAST 03/27/2024 07:41:00 AM TECHNIQUE: CT of the head was performed without the administration of intravenous contrast. Automated exposure control, iterative reconstruction, and/or weight based adjustment of the mA/kV was utilized to reduce the radiation dose to as low as reasonably achievable. COMPARISON: Head CT 07/16/2023 and MRI 06/24/2020. CLINICAL HISTORY: Head trauma, minor (Age >= 65 years). Fall, on blood thinners. FINDINGS: BRAIN AND VENTRICLES: There is no evidence of an acute infarct, intracranial hemorrhage, mass, midline shift, hydrocephalus, or extra-axial fluid collection. Mild cerebral atrophy is within normal limits for age. Patchy hypodensities in the cerebral white matter bilaterally are similar to the prior CT and are nonspecific but  compatible with moderate chronic small vessel ischemic disease. Chronic bilateral thalamic lacunar infarcts are again noted. Calcified atherosclerosis at the skull base. ORBITS: Bilateral cataract extraction. SINUSES: No acute abnormality. SOFT TISSUES AND SKULL: Moderate sized right parietal scalp hematoma. No skull fracture. IMPRESSION: 1. No acute intracranial abnormality. 2. Right parietal scalp hematoma. 3. Moderate chronic small vessel ischemic disease. Electronically signed by: Dasie Hamburg MD 03/27/2024 07:48 AM EST RP Workstation: [id number]   DG Pelvis Portable Result Date: 03/27/2024 EXAM: 2 VIEW(S) XRAY OF THE PELVIS 03/27/2024 07:02:00 AM COMPARISON: CT abdomen and pelvis 03/23/2022, and pelvis radiograph 03/19/2023. CLINICAL HISTORY: 89 year old female. Right hip pain after a fall on blood thinners. FINDINGS: BONES AND JOINTS: Total right hip arthroplasty in place. Stable AP alignment of right bipolar hip arthroplasty components. Acute, largely nondisplaced periprosthetic fracture along the proximal right femoral shaft. Mild degenerative changes of the left hip joint with mild-to-moderate joint space narrowing and osteophytosis of the superior acetabulum. SOFT TISSUES: Vascular calcifications. IMPRESSION: 1. Acute, largely nondisplaced periprosthetic fracture of the  proximal right femur. 2. Stable AP alignment of right bipolar hip arthroplasty components. Electronically signed by: Helayne Hurst MD 03/27/2024 07:07 AM EST RP Workstation: [id number]   DG Chest Portable 1 View Result Date: 03/27/2024 EXAM: 1 VIEW XRAY OF THE CHEST 03/27/2024 07:02:00 AM COMPARISON: 06/09/2023 CLINICAL HISTORY: 89 year old female. Right hip pain after a fall on blood thinners. FINDINGS: Asymmetric breast attenuation artifact suspected over the left chest. LUNGS AND PLEURA: Biapical pleural thickening with calcification. No focal pulmonary opacity. No pleural effusion. No pneumothorax. HEART AND MEDIASTINUM:  Cardiomegaly. Calcified aorta. Prosthetic cardiac valve noted. Sternotomy wires noted. BONES AND SOFT TISSUES: Chronic severe right greater than left glenohumeral degeneration. Bilateral shoulder degenerative joint disease (DJD). Negative visible bowel gas. No acute osseous abnormality. IMPRESSION: 1. No acute cardiopulmonary abnormality. 2. Cardiomegaly with prosthetic cardiac valve. 3. Aortic Atherosclerosis (ICD10-I70.0). Electronically signed by: Helayne Hurst MD 03/27/2024 07:05 AM EST RP Workstation: [id number]      Subjective: Patient seen and examined at bedside today.  She looks better today.  Heart rate well-controlled.Remains in  A-fib rhythm.  Blood pressure soft but stable.  Son wants to take her home today.  Waiting for hospital bed  Discharge Exam: Vitals:   04/05/24 0501 04/05/24 0731  BP: (!) 94/59 109/60  Pulse: (!) 58 66  Resp: 18 16  Temp: 98.3 F (36.8 C) 97.8 F (36.6 C)  SpO2: 97% 98%   Vitals:   04/04/24 1938 04/05/24 0012 04/05/24 0501 04/05/24 0731  BP: 121/74 (!) 113/57 (!) 94/59 109/60  Pulse: 90 88 (!) 58 66  Resp: 17 17 18 16   Temp: 98.7 F (37.1 C) (!) 97.4 F (36.3 C) 98.3 F (36.8 C) 97.8 F (36.6 C)  TempSrc: Oral Oral Oral Oral  SpO2: 90% 94% 97% 98%  Weight:      Height:        General: Pt is alert, awake, not in acute distress, pleasantly confused Cardiovascular: irregularly irregular rhythm, no rubs, no gallops Respiratory: CTA bilaterally, no wheezing, no rhonchi Abdominal: Soft, NT, ND, bowel sounds + Extremities: no edema, no cyanosis    The results of significant diagnostics from this hospitalization (including imaging, microbiology, ancillary and laboratory) are listed below for reference.     Microbiology: Recent Results (from the past 240 hours)  Urine Culture     Status: Abnormal   Collection Time: 04/02/24  1:20 AM   Specimen: Urine, Catheterized  Result Value Ref Range Status   Specimen Description URINE, CATHETERIZED   Final   Special Requests   Final    NONE Performed at [facility], [address] [place], [place] [address]    Culture >=100,000 COLONIES/mL KLEBSIELLA PNEUMONIAE (A)  Final   Report Status 04/04/2024 FINAL  Final   Organism ID, Bacteria KLEBSIELLA PNEUMONIAE (A)  Final      Susceptibility   Klebsiella pneumoniae - MIC*    AMPICILLIN >=32 RESISTANT Resistant     CEFAZOLIN (URINE) Value in next row Sensitive      2 SENSITIVEThis is a modified FDA-approved test that has been validated and its performance characteristics determined by the reporting laboratory.  This laboratory is certified under the Clinical Laboratory Improvement Amendments CLIA as qualified to perform high complexity clinical laboratory testing.    CEFEPIME Value in next row Sensitive      2 SENSITIVEThis is a modified FDA-approved test that has been validated and its performance characteristics determined by the reporting laboratory.  This laboratory is certified under the Clinical  Laboratory Improvement Amendments CLIA as qualified to perform high complexity clinical laboratory testing.    ERTAPENEM Value in next row Sensitive      2 SENSITIVEThis is a modified FDA-approved test that has been validated and its performance characteristics determined by the reporting laboratory.  This laboratory is certified under the Clinical Laboratory Improvement Amendments CLIA as qualified to perform high complexity clinical laboratory testing.    CEFTRIAXONE  Value in next row Sensitive      2 SENSITIVEThis is a modified FDA-approved test that has been validated and its performance characteristics determined by the reporting laboratory.  This laboratory is certified under the Clinical Laboratory Improvement Amendments CLIA as qualified to perform high complexity clinical laboratory testing.    CIPROFLOXACIN  Value in next row Sensitive      2 SENSITIVEThis is a modified FDA-approved test that has been validated and its performance  characteristics determined by the reporting laboratory.  This laboratory is certified under the Clinical Laboratory Improvement Amendments CLIA as qualified to perform high complexity clinical laboratory testing.    GENTAMICIN Value in next row Sensitive      2 SENSITIVEThis is a modified FDA-approved test that has been validated and its performance characteristics determined by the reporting laboratory.  This laboratory is certified under the Clinical Laboratory Improvement Amendments CLIA as qualified to perform high complexity clinical laboratory testing.    NITROFURANTOIN Value in next row Intermediate      2 SENSITIVEThis is a modified FDA-approved test that has been validated and its performance characteristics determined by the reporting laboratory.  This laboratory is certified under the Clinical Laboratory Improvement Amendments CLIA as qualified to perform high complexity clinical laboratory testing.    TRIMETH/SULFA Value in next row Sensitive      2 SENSITIVEThis is a modified FDA-approved test that has been validated and its performance characteristics determined by the reporting laboratory.  This laboratory is certified under the Clinical Laboratory Improvement Amendments CLIA as qualified to perform high complexity clinical laboratory testing.    AMPICILLIN/SULBACTAM Value in next row Sensitive      2 SENSITIVEThis is a modified FDA-approved test that has been validated and its performance characteristics determined by the reporting laboratory.  This laboratory is certified under the Clinical Laboratory Improvement Amendments CLIA as qualified to perform high complexity clinical laboratory testing.    PIP/TAZO Value in next row Sensitive      <=4 SENSITIVEThis is a modified FDA-approved test that has been validated and its performance characteristics determined by the reporting laboratory.  This laboratory is certified under the Clinical Laboratory Improvement Amendments CLIA as qualified to  perform high complexity clinical laboratory testing.    MEROPENEM Value in next row Sensitive      <=4 SENSITIVEThis is a modified FDA-approved test that has been validated and its performance characteristics determined by the reporting laboratory.  This laboratory is certified under the Clinical Laboratory Improvement Amendments CLIA as qualified to perform high complexity clinical laboratory testing.    * >=100,000 COLONIES/mL KLEBSIELLA PNEUMONIAE     Labs: BNP (last 3 results) Recent Labs    09/19/23 1628  BNP 331*   Basic Metabolic Panel: Recent Labs  Lab 04/02/24 0120 04/02/24 0448 04/03/24 0853 04/04/24 0217  NA 141 141 143 140  K 3.7 3.5 3.7 3.5  CL 98 102 105 103  CO2 22 27 27 26   GLUCOSE 114* 110* 68* 145*  BUN 37* 37* 27* 22  CREATININE 1.68* 1.57* 1.14* 1.01*  CALCIUM  10.0 9.2  8.6* 8.6*  MG  --   --  1.7  --    Liver Function Tests: Recent Labs  Lab 04/03/24 0853 04/04/24 0217  AST 81* 89*  ALT 65* 79*  ALKPHOS 63 73  BILITOT 1.2 1.0  PROT 4.9* 5.4*  ALBUMIN  2.9* 3.1*   No results for input(s): LIPASE, AMYLASE in the last 168 hours. No results for input(s): AMMONIA in the last 168 hours. CBC: Recent Labs  Lab 04/02/24 0120 04/02/24 0448 04/03/24 0853 04/04/24 0217  WBC 11.5* 8.9 7.5 9.6  NEUTROABS 8.2*  --   --   --   HGB 10.2* 8.9* 8.5* 8.4*  HCT 30.6* 26.2* 25.6* 25.7*  MCV 100.0 100.4* 100.8* 101.2*  PLT 199 177 193 209   Cardiac Enzymes: No results for input(s): CKTOTAL, CKMB, CKMBINDEX, TROPONINI in the last 168 hours. BNP: Invalid input(s): POCBNP CBG: No results for input(s): GLUCAP in the last 168 hours. D-Dimer No results for input(s): DDIMER in the last 72 hours. Hgb A1c No results for input(s): HGBA1C in the last 72 hours. Lipid Profile No results for input(s): CHOL, HDL, LDLCALC, TRIG, CHOLHDL, LDLDIRECT in the last 72 hours. Thyroid  function studies No results for input(s): TSH,  T4TOTAL, T3FREE, THYROIDAB in the last 72 hours.  Invalid input(s): FREET3 Anemia work up No results for input(s): VITAMINB12, FOLATE, FERRITIN, TIBC, IRON, RETICCTPCT in the last 72 hours. Urinalysis    Component Value Date/Time   COLORURINE AMBER (A) 04/02/2024 0120   APPEARANCEUR CLOUDY (A) 04/02/2024 0120   LABSPEC 1.021 04/02/2024 0120   PHURINE 5.0 04/02/2024 0120   GLUCOSEU NEGATIVE 04/02/2024 0120   HGBUR MODERATE (A) 04/02/2024 0120   BILIRUBINUR NEGATIVE 04/02/2024 0120   KETONESUR NEGATIVE 04/02/2024 0120   PROTEINUR 30 (A) 04/02/2024 0120   UROBILINOGEN 0.2 08/28/2012 1523   NITRITE NEGATIVE 04/02/2024 0120   LEUKOCYTESUR MODERATE (A) 04/02/2024 0120   Sepsis Labs Recent Labs  Lab 04/02/24 0120 04/02/24 0448 04/03/24 0853 04/04/24 0217  WBC 11.5* 8.9 7.5 9.6   Microbiology Recent Results (from the past 240 hours)  Urine Culture     Status: Abnormal   Collection Time: 04/02/24  1:20 AM   Specimen: Urine, Catheterized  Result Value Ref Range Status   Specimen Description URINE, CATHETERIZED  Final   Special Requests   Final    NONE Performed at [facility], [address], [place], [place] [address]    Culture >=100,000 COLONIES/mL KLEBSIELLA PNEUMONIAE (A)  Final   Report Status 04/04/2024 FINAL  Final   Organism ID, Bacteria KLEBSIELLA PNEUMONIAE (A)  Final      Susceptibility   Klebsiella pneumoniae - MIC*    AMPICILLIN >=32 RESISTANT Resistant     CEFAZOLIN (URINE) Value in next row Sensitive      2 SENSITIVEThis is a modified FDA-approved test that has been validated and its performance characteristics determined by the reporting laboratory.  This laboratory is certified under the Clinical Laboratory Improvement Amendments CLIA as qualified to perform high complexity clinical laboratory testing.    CEFEPIME Value in next row Sensitive      2 SENSITIVEThis is a modified FDA-approved test that has been validated and its  performance characteristics determined by the reporting laboratory.  This laboratory is certified under the Clinical Laboratory Improvement Amendments CLIA as qualified to perform high complexity clinical laboratory testing.    ERTAPENEM Value in next row Sensitive      2 SENSITIVEThis is a modified FDA-approved test that has  been validated and its performance characteristics determined by the reporting laboratory.  This laboratory is certified under the Clinical Laboratory Improvement Amendments CLIA as qualified to perform high complexity clinical laboratory testing.    CEFTRIAXONE  Value in next row Sensitive      2 SENSITIVEThis is a modified FDA-approved test that has been validated and its performance characteristics determined by the reporting laboratory.  This laboratory is certified under the Clinical Laboratory Improvement Amendments CLIA as qualified to perform high complexity clinical laboratory testing.    CIPROFLOXACIN  Value in next row Sensitive      2 SENSITIVEThis is a modified FDA-approved test that has been validated and its performance characteristics determined by the reporting laboratory.  This laboratory is certified under the Clinical Laboratory Improvement Amendments CLIA as qualified to perform high complexity clinical laboratory testing.    GENTAMICIN Value in next row Sensitive      2 SENSITIVEThis is a modified FDA-approved test that has been validated and its performance characteristics determined by the reporting laboratory.  This laboratory is certified under the Clinical Laboratory Improvement Amendments CLIA as qualified to perform high complexity clinical laboratory testing.    NITROFURANTOIN Value in next row Intermediate      2 SENSITIVEThis is a modified FDA-approved test that has been validated and its performance characteristics determined by the reporting laboratory.  This laboratory is certified under the Clinical Laboratory Improvement Amendments CLIA as qualified  to perform high complexity clinical laboratory testing.    TRIMETH/SULFA Value in next row Sensitive      2 SENSITIVEThis is a modified FDA-approved test that has been validated and its performance characteristics determined by the reporting laboratory.  This laboratory is certified under the Clinical Laboratory Improvement Amendments CLIA as qualified to perform high complexity clinical laboratory testing.    AMPICILLIN/SULBACTAM Value in next row Sensitive      2 SENSITIVEThis is a modified FDA-approved test that has been validated and its performance characteristics determined by the reporting laboratory.  This laboratory is certified under the Clinical Laboratory Improvement Amendments CLIA as qualified to perform high complexity clinical laboratory testing.    PIP/TAZO Value in next row Sensitive      <=4 SENSITIVEThis is a modified FDA-approved test that has been validated and its performance characteristics determined by the reporting laboratory.  This laboratory is certified under the Clinical Laboratory Improvement Amendments CLIA as qualified to perform high complexity clinical laboratory testing.    MEROPENEM Value in next row Sensitive      <=4 SENSITIVEThis is a modified FDA-approved test that has been validated and its performance characteristics determined by the reporting laboratory.  This laboratory is certified under the Clinical Laboratory Improvement Amendments CLIA as qualified to perform high complexity clinical laboratory testing.    * >=100,000 COLONIES/mL KLEBSIELLA PNEUMONIAE    Please note: You were cared for by a hospitalist during your hospital stay. Once you are discharged, your primary care physician will handle any further medical issues. Please note that NO REFILLS for any discharge medications will be authorized once you are discharged, as it is imperative that you return to your primary care physician (or establish a relationship with a primary care physician if you do  not have one) for your post hospital discharge needs so that they can reassess your need for medications and monitor your lab values.    Time coordinating discharge: 40 minutes  SIGNED:   Ivonne Mustache, MD  [facility] 04/05/2024, 10:23 AM Pager [phone]  If  7PM-7AM, please contact night-coverage [email or web address] Password [email or web address]    [1]  Allergies Allergen Reactions   Lisinopril  Cough   Ceftriaxone  Rash   Codeine Rash    All over the body or any related products

## 2024-04-05 NOTE — Care Management Important Message (Signed)
 Important Message  Patient Details  Name: [name] MRN: [id number] Date of Birth: 1931/03/12   Important Message Given:  Yes - Medicare IM     Claretta Deed 04/05/2024, 2:25 PM

## 2024-04-05 NOTE — TOC Transition Note (Addendum)
 Transition of Care [facility]) - Discharge Note   Patient Details  Name: [name] MRN: [id number] Date of Birth: 06-Oct-1931  Transition of Care [facility]) CM/SW Contact:  Andrez JULIANNA George, RN Phone Number: 04/05/2024, 10:58 AM   Clinical Narrative:     Son has declined SNF rehab. He wants to take the patient home with home health services. She was set up with [facility] after last admission. Hedda had not see her yet but are willing to start care after dc. Hedda is aware of dc home today.  [employer] has delivered her prior ordered lift to the home. Bed to be delivered today.  Pt is transporting home via PTAR. Home address verified. ICM will arrange PTAR once bed has been delivered.  No further needs per ICM.  1455: pt with run of [facility]. MD holding dc for today. Son updated.   Final next level of care: Home w Home Health Services Barriers to Discharge: No Barriers Identified   Patient Goals and CMS Choice   CMS Medicare.gov Compare Post Acute Care list provided to:: Patient Represenative (must comment) Choice offered to / list presented to : Adult Children      Discharge Placement                       Discharge Plan and Services Additional resources added to the After Visit Summary for     Discharge Planning Services: CM Consult Post Acute Care Choice: Home Health          DME Arranged: Hospital bed (hoyer lift) DME Agency: [employer] Date DME Agency Contacted: 04/03/24   Representative spoke with at DME Agency: [place] HH Arranged: RN, PT, OT, Nurse's Aide HH Agency: [facility] Date [facility] Agency Contacted: 04/05/24   Representative spoke with at [facility] Agency: Darle[facility]  Social Drivers of Health (SDOH) Interventions SDOH Screenings   Food Insecurity: No Food Insecurity (04/03/2024)  Housing: Low Risk (04/03/2024)  Transportation Needs: No Transportation Needs (04/03/2024)  Utilities: Not At Risk (04/03/2024)  Depression (PHQ2-9): Medium Risk (09/19/2023)   Social Connections: Socially Isolated (04/03/2024)  Tobacco Use: Low Risk (04/02/2024)     Readmission Risk Interventions     No data to display

## 2024-04-06 ENCOUNTER — Telehealth: Payer: Self-pay | Admitting: Cardiovascular Disease

## 2024-04-06 ENCOUNTER — Other Ambulatory Visit (HOSPITAL_COMMUNITY): Payer: Self-pay

## 2024-04-06 NOTE — TOC Transition Note (Signed)
 Transition of Care [facility]) - Discharge Note   Patient Details  Name: [name] MRN: [id number] Date of Birth: [birth year]  Transition of Care [facility]) CM/SW Contact:  Andrez JULIANNA George, RN Phone Number: 04/06/2024, 10:50 AM   Clinical Narrative:     MD has cleared for dc home today. PTAR arranged. Bedside RN updated. Son at the bedside and aware.   Final next level of care: Home w Home Health Services Barriers to Discharge: No Barriers Identified   Patient Goals and CMS Choice   CMS Medicare.gov Compare Post Acute Care list provided to:: Patient Represenative (must comment) Choice offered to / list presented to : Adult Children      Discharge Placement                       Discharge Plan and Services Additional resources added to the After Visit Summary for     Discharge Planning Services: CM Consult Post Acute Care Choice: Home Health          DME Arranged: Hospital bed (hoyer lift) DME Agency: [employer] Date DME Agency Contacted: 04/03/24   Representative spoke with at DME Agency: [place] HH Arranged: RN, PT, OT, Nurse's Aide HH Agency: [facility] Date [facility] Agency Contacted: 04/05/24   Representative spoke with at [facility] Agency: Darleene  Social Drivers of Health (SDOH) Interventions SDOH Screenings   Food Insecurity: No Food Insecurity (04/03/2024)  Housing: Low Risk (04/03/2024)  Transportation Needs: No Transportation Needs (04/03/2024)  Utilities: Not At Risk (04/03/2024)  Depression (PHQ2-9): Medium Risk (09/19/2023)  Social Connections: Socially Isolated (04/03/2024)  Tobacco Use: Low Risk (04/02/2024)     Readmission Risk Interventions     No data to display

## 2024-04-06 NOTE — Progress Notes (Signed)
 Patient seen and examined at bedside today.  Remains hemodynamically stable.  Heart rate well-controlled.  She could not be discharged yesterday because she had an episode of nonsustained V. tach and we wanted to monitor her overnight.  Her electrolytes are stable. She remains comfortable this morning.  Can be discharged today.  Discharge summary and orders are already in place.  Long discussion held at the bedside with the son about goals of care.  He wants to maintain her as full code and continue to pursue full scope of treatment.

## 2024-04-06 NOTE — Progress Notes (Signed)
 Reviewed DC instructions with son. He had already picked up TOC Meds. All belongings taken by son.

## 2024-04-06 NOTE — Telephone Encounter (Signed)
 Pt states son that in hospital for hip but concerned about BP. Pt's son would like a c/b regarding this matter. Please advise

## 2024-04-06 NOTE — Telephone Encounter (Signed)
 Spoke with [name] per DPR. Pt is in hospital currently (to be released today) and was requesting sooner appt. Appt set with Janene on 04/09/24.

## 2024-04-09 ENCOUNTER — Inpatient Hospital Stay: Admitting: Physician Assistant

## 2024-05-07 ENCOUNTER — Ambulatory Visit: Admitting: Cardiovascular Disease

## 2024-05-29 ENCOUNTER — Ambulatory Visit: Admitting: Podiatry
